# Patient Record
Sex: Female | Born: 1980 | Race: Black or African American | Hispanic: No
Health system: Midwestern US, Community
[De-identification: ages and names within clinical notes are randomized; demographics above are authoritative.]

## PROBLEM LIST (undated history)

## (undated) DIAGNOSIS — N809 Endometriosis, unspecified: Secondary | ICD-10-CM

## (undated) DIAGNOSIS — E119 Type 2 diabetes mellitus without complications: Secondary | ICD-10-CM

## (undated) DIAGNOSIS — F329 Major depressive disorder, single episode, unspecified: Secondary | ICD-10-CM

## (undated) DIAGNOSIS — F419 Anxiety disorder, unspecified: Secondary | ICD-10-CM

## (undated) DIAGNOSIS — F32A Depression, unspecified: Secondary | ICD-10-CM

## (undated) DIAGNOSIS — D649 Anemia, unspecified: Secondary | ICD-10-CM

## (undated) DIAGNOSIS — Z8489 Family history of other specified conditions: Secondary | ICD-10-CM

## (undated) DIAGNOSIS — N898 Other specified noninflammatory disorders of vagina: Secondary | ICD-10-CM

## (undated) DIAGNOSIS — K5730 Diverticulosis of large intestine without perforation or abscess without bleeding: Secondary | ICD-10-CM

## (undated) DIAGNOSIS — G5601 Carpal tunnel syndrome, right upper limb: Secondary | ICD-10-CM

## (undated) DIAGNOSIS — I10 Essential (primary) hypertension: Secondary | ICD-10-CM

## (undated) DIAGNOSIS — Z794 Long term (current) use of insulin: Principal | ICD-10-CM

## (undated) DIAGNOSIS — E11649 Type 2 diabetes mellitus with hypoglycemia without coma: Principal | ICD-10-CM

## (undated) DIAGNOSIS — Z1231 Encounter for screening mammogram for malignant neoplasm of breast: Principal | ICD-10-CM

## (undated) DIAGNOSIS — R000 Tachycardia, unspecified: Principal | ICD-10-CM

## (undated) DIAGNOSIS — R102 Pelvic and perineal pain unspecified side: Secondary | ICD-10-CM

## (undated) DIAGNOSIS — E1140 Type 2 diabetes mellitus with diabetic neuropathy, unspecified: Secondary | ICD-10-CM

## (undated) DIAGNOSIS — J0110 Acute frontal sinusitis, unspecified: Secondary | ICD-10-CM

## (undated) DIAGNOSIS — G8918 Other acute postprocedural pain: Secondary | ICD-10-CM

## (undated) DIAGNOSIS — R109 Unspecified abdominal pain: Secondary | ICD-10-CM

## (undated) DIAGNOSIS — N83209 Unspecified ovarian cyst, unspecified side: Secondary | ICD-10-CM

## (undated) DIAGNOSIS — B351 Tinea unguium: Secondary | ICD-10-CM

## (undated) DIAGNOSIS — D1722 Benign lipomatous neoplasm of skin and subcutaneous tissue of left arm: Secondary | ICD-10-CM

## (undated) DIAGNOSIS — E1165 Type 2 diabetes mellitus with hyperglycemia: Principal | ICD-10-CM

## (undated) DIAGNOSIS — K219 Gastro-esophageal reflux disease without esophagitis: Secondary | ICD-10-CM

## (undated) DIAGNOSIS — R002 Palpitations: Principal | ICD-10-CM

## (undated) DIAGNOSIS — R771 Abnormality of globulin: Secondary | ICD-10-CM

## (undated) DIAGNOSIS — G8929 Other chronic pain: Secondary | ICD-10-CM

## (undated) DIAGNOSIS — M25522 Pain in left elbow: Secondary | ICD-10-CM

## (undated) DIAGNOSIS — Z90710 Acquired absence of both cervix and uterus: Secondary | ICD-10-CM

## (undated) DIAGNOSIS — N920 Excessive and frequent menstruation with regular cycle: Secondary | ICD-10-CM

## (undated) DIAGNOSIS — M545 Low back pain, unspecified: Secondary | ICD-10-CM

## (undated) DIAGNOSIS — E041 Nontoxic single thyroid nodule: Secondary | ICD-10-CM

## (undated) DIAGNOSIS — D259 Leiomyoma of uterus, unspecified: Secondary | ICD-10-CM

## (undated) DIAGNOSIS — G4733 Obstructive sleep apnea (adult) (pediatric): Secondary | ICD-10-CM

## (undated) DIAGNOSIS — K432 Incisional hernia without obstruction or gangrene: Secondary | ICD-10-CM

## (undated) DIAGNOSIS — E118 Type 2 diabetes mellitus with unspecified complications: Secondary | ICD-10-CM

## (undated) DIAGNOSIS — D251 Intramural leiomyoma of uterus: Secondary | ICD-10-CM

## (undated) DIAGNOSIS — R1033 Periumbilical pain: Secondary | ICD-10-CM

## (undated) DIAGNOSIS — J4521 Mild intermittent asthma with (acute) exacerbation: Secondary | ICD-10-CM

## (undated) DIAGNOSIS — Z111 Encounter for screening for respiratory tuberculosis: Secondary | ICD-10-CM

## (undated) HISTORY — DX: Endometriosis, unspecified: N80.9

## (undated) HISTORY — PX: APPENDECTOMY: SHX54

## (undated) MED FILL — PRODIGY TWIST TOP LANCETS 28  MISC: 90 days supply | Qty: 100 | Fill #0 | Status: AC

## (undated) MED FILL — CICLOPIROX 0.77% GEL: 0.77 % | 3 days supply | Qty: 45 | Fill #0 | Status: AC

## (undated) MED FILL — VALACYCLOVIR HCL 500MG TABS: 500 MG | 90 days supply | Qty: 180 | Fill #0 | Status: AC

## (undated) MED FILL — FREESTYLE LIBRE 2 SENSOR  MISC: 84 days supply | Qty: 6 | Fill #0 | Status: AC

## (undated) MED FILL — ROSUVASTATIN CALCIUM 10MG TABS: 10 MG | 90 days supply | Qty: 90 | Fill #0 | Status: AC

## (undated) MED FILL — OZEMPIC 1MG/DOSE SOPN (3ML=1BOX): 2 MG/1.5ML | 84 days supply | Qty: 9 | Fill #0 | Status: AC

## (undated) MED FILL — LEADER PEN NEEDLE 31G X 8MM (5/16"): 31G X 8 MM | 90 days supply | Qty: 300 | Fill #0 | Status: AC

## (undated) MED FILL — BUTALBITAL-APAP-CAFF 50-300-40 CAPS: 50-300-40 MG | 3 days supply | Qty: 20 | Fill #0 | Status: AC

## (undated) MED FILL — PRODIGY AUTOCODE BLOOD W/DEVICE KIT: w/Device | 30 days supply | Qty: 1 | Fill #0 | Status: AC

## (undated) MED FILL — TOUJEO MAX SOLOSTAR 300UN/ML (6ML=1 BOX): 300 UNIT/ML | 90 days supply | Qty: 24 | Fill #2 | Status: AC

## (undated) MED FILL — B-D UF PEN NEED 5/16" 31G (8MM): 31G X 8 MM | 90 days supply | Qty: 300 | Fill #0 | Status: AC

## (undated) MED FILL — PRODIGY NO CODING BLOOD GLUC  STRP: 90 days supply | Qty: 100 | Fill #0 | Status: AC

## (undated) MED FILL — PRODIGY NO CODING BLOOD GLUC  STRP: 90 days supply | Qty: 400 | Fill #0 | Status: AC

## (undated) MED FILL — LISINOPRIL 10MG TABS: 10 MG | 90 days supply | Qty: 90 | Fill #0 | Status: AC

## (undated) MED FILL — TRULICITY 4.5MG/0.5ML PEN: 4.5 MG/0.5ML | 84 days supply | Qty: 6 | Fill #1 | Status: AC

## (undated) MED FILL — TRULICITY 0.75MG/0.5ML PEN: 0.75 MG/0.5ML | 28 days supply | Qty: 2 | Fill #0 | Status: AC

## (undated) MED FILL — METFORMIN HCL ER 500MG TB24: 500 MG | 90 days supply | Qty: 360 | Fill #1 | Status: AC

## (undated) MED FILL — TRULICITY 4.5MG/0.5ML PEN: 4.5 MG/0.5ML | 84 days supply | Qty: 6 | Fill #0 | Status: AC

## (undated) MED FILL — LEADER PEN NEEDLE 31G X 8MM (5/16"): 31G X 8 MM | 90 days supply | Qty: 100 | Fill #0 | Status: AC

## (undated) MED FILL — OZEMPIC 0.25 OR 0.5MG/DOSE (1.5ML=1BOX): 2 MG/1.5ML | 84 days supply | Qty: 6 | Fill #0 | Status: AC

## (undated) MED FILL — METHOCARBAMOL 500MG TABS: 500 MG | 5 days supply | Qty: 20 | Fill #0 | Status: AC

## (undated) MED FILL — TOUJEO MAX SOLOSTAR 300UN/ML (6ML=1 BOX): 300 UNIT/ML | 90 days supply | Qty: 24 | Fill #0 | Status: AC

## (undated) MED FILL — FLUTICASONE PROPIONATE 50 SUSP: 50 MCG/ACT | 90 days supply | Qty: 48 | Fill #0 | Status: AC

## (undated) MED FILL — TRULICITY 0.75MG/0.5ML PEN: 0.75 MG/0.5ML | 84 days supply | Qty: 6 | Fill #0 | Status: AC

## (undated) MED FILL — TRULICITY 0.75MG/0.5ML PEN: 0.75 MG/0.5ML | 84 days supply | Qty: 6 | Fill #1 | Status: AC

## (undated) MED FILL — LANTUS SOLOSTAR 100UNIT/ML (15ML=1BOX): 100 UNIT/ML | 90 days supply | Qty: 75 | Fill #1 | Status: AC

## (undated) MED FILL — LEADER PEN NEEDLE 31G X 8MM (5/16"): 31G X 8 MM | 90 days supply | Qty: 300 | Fill #1 | Status: AC

## (undated) MED FILL — DICYCLOMINE HCL 20MG TABS: 20 MG | 7 days supply | Qty: 30 | Fill #0 | Status: AC

## (undated) MED FILL — ONDANSETRON 4MG TBDP: 4 MG | 6 days supply | Qty: 20 | Fill #0 | Status: AC

## (undated) MED FILL — ALCOHOL SWABS  PADS: 90 days supply | Qty: 100 | Fill #0 | Status: AC

## (undated) MED FILL — ACETAMINOPHEN EXTRA STRENG 500 TABS: 500 MG | 6 days supply | Qty: 24 | Fill #0 | Status: AC

## (undated) MED FILL — IBUPROFEN 800MG TABS: 800 MG | 8 days supply | Qty: 30 | Fill #0 | Status: AC

## (undated) MED FILL — METFORMIN HCL ER 500MG TB24: 500 MG | 90 days supply | Qty: 360 | Fill #0 | Status: AC

## (undated) MED FILL — LANTUS SOLOSTAR 100UNIT/ML (15ML=1BOX): 100 UNIT/ML | 80 days supply | Qty: 60 | Fill #0 | Status: AC

## (undated) MED FILL — ROSUVASTATIN CALCIUM 10MG TABS: 10 MG | 90 days supply | Qty: 90 | Fill #1 | Status: AC

## (undated) MED FILL — LISINOPRIL-HYDROCHLORO 10-12.5 TABS: 10-12.5 MG | 90 days supply | Qty: 90 | Fill #0 | Status: AC

## (undated) MED FILL — TRULICITY 4.5MG/0.5ML PEN: 4.5 MG/0.5ML | 84 days supply | Qty: 6 | Fill #2 | Status: AC

## (undated) MED FILL — FLUCONAZOLE 200MG TABS: 200 MG | 84 days supply | Qty: 18 | Fill #0 | Status: AC

## (undated) MED FILL — TOUJEO MAX SOLOSTAR 300UN/ML (6ML=1 BOX): 300 UNIT/ML | 90 days supply | Qty: 24 | Fill #1 | Status: AC

## (undated) MED FILL — LANTUS SOLOSTAR 100UNIT/ML (15ML=1BOX): 100 UNIT/ML | 56 days supply | Qty: 45 | Fill #0 | Status: AC

## (undated) MED FILL — PRODIGY AUTOCODE BLOOD GLUCO  DEVI: 1 days supply | Qty: 1 | Fill #0 | Status: AC

## (undated) MED FILL — TRULICITY 4.5MG/0.5ML PEN: 4.5 MG/0.5ML | 84 days supply | Qty: 6 | Fill #3 | Status: AC

## (undated) MED FILL — METFORMIN HCL ER 500MG TB24: 500 MG | 90 days supply | Qty: 180 | Fill #0 | Status: AC

## (undated) MED FILL — B-D UF PEN NEED 5/16" 31G (8MM): 31G X 8 MM | 90 days supply | Qty: 100 | Fill #1 | Status: AC

## (undated) MED FILL — FREESTYLE LIBRE 2 SENSOR  MISC: 84 days supply | Qty: 6 | Fill #1 | Status: AC

## (undated) MED FILL — TRULICITY 1.5MG/0.5ML PEN: 1.5 MG/0.5ML | 28 days supply | Qty: 2 | Fill #0 | Status: AC

## (undated) MED FILL — TRULICITY 3MG/0.5ML PEN: 3 MG/0.5ML | 28 days supply | Qty: 2 | Fill #0 | Status: AC

## (undated) MED FILL — FREESTYLE LIBRE 14 DAY SENSO  MISC: 28 days supply | Qty: 2 | Fill #0 | Status: AC

## (undated) MED FILL — LIDOCAINE 5% PTCH (30EACH=1BOX): 5 % | 5 days supply | Qty: 5 | Fill #0 | Status: AC

## (undated) MED FILL — LANTUS SOLOSTAR 100UNIT/ML (15ML=1BOX): 100 UNIT/ML | 90 days supply | Qty: 75 | Fill #0 | Status: AC

## (undated) MED FILL — PRODIGY LANCING DEVICE  MISC: 1 days supply | Qty: 1 | Fill #0 | Status: AC

---

## 2005-08-28 ENCOUNTER — Inpatient Hospital Stay (HOSPITAL_COMMUNITY): Admission: AD | Admit: 2005-08-28 | Discharge: 2005-08-29 | Payer: Self-pay | Admitting: Obstetrics and Gynecology

## 2005-12-17 ENCOUNTER — Ambulatory Visit: Payer: Self-pay | Admitting: Certified Nurse Midwife

## 2005-12-17 ENCOUNTER — Inpatient Hospital Stay (HOSPITAL_COMMUNITY): Admission: AD | Admit: 2005-12-17 | Discharge: 2005-12-17 | Payer: Self-pay | Admitting: Family Medicine

## 2006-01-06 ENCOUNTER — Inpatient Hospital Stay (HOSPITAL_COMMUNITY): Admission: AD | Admit: 2006-01-06 | Discharge: 2006-01-06 | Payer: Self-pay | Admitting: Gynecology

## 2006-01-06 ENCOUNTER — Ambulatory Visit: Payer: Self-pay | Admitting: Gynecology

## 2006-01-30 ENCOUNTER — Ambulatory Visit: Payer: Self-pay | Admitting: Obstetrics & Gynecology

## 2006-01-30 ENCOUNTER — Inpatient Hospital Stay (HOSPITAL_COMMUNITY): Admission: RE | Admit: 2006-01-30 | Discharge: 2006-02-02 | Payer: Self-pay | Admitting: Obstetrics & Gynecology

## 2006-02-05 ENCOUNTER — Ambulatory Visit: Payer: Self-pay | Admitting: Obstetrics and Gynecology

## 2006-02-05 ENCOUNTER — Inpatient Hospital Stay (HOSPITAL_COMMUNITY): Admission: AD | Admit: 2006-02-05 | Discharge: 2006-02-05 | Payer: Self-pay | Admitting: Obstetrics & Gynecology

## 2006-02-06 ENCOUNTER — Inpatient Hospital Stay (HOSPITAL_COMMUNITY): Admission: AD | Admit: 2006-02-06 | Discharge: 2006-02-06 | Payer: Self-pay | Admitting: Obstetrics & Gynecology

## 2006-12-15 ENCOUNTER — Emergency Department (HOSPITAL_COMMUNITY): Admission: EM | Admit: 2006-12-15 | Discharge: 2006-12-15 | Payer: Self-pay | Admitting: Emergency Medicine

## 2007-08-04 ENCOUNTER — Emergency Department (HOSPITAL_COMMUNITY): Admission: EM | Admit: 2007-08-04 | Discharge: 2007-08-04 | Payer: Self-pay | Admitting: Emergency Medicine

## 2007-08-06 ENCOUNTER — Emergency Department (HOSPITAL_COMMUNITY): Admission: EM | Admit: 2007-08-06 | Discharge: 2007-08-06 | Payer: Self-pay | Admitting: Emergency Medicine

## 2008-01-17 ENCOUNTER — Inpatient Hospital Stay (HOSPITAL_COMMUNITY): Admission: AD | Admit: 2008-01-17 | Discharge: 2008-01-17 | Payer: Self-pay | Admitting: Obstetrics & Gynecology

## 2008-05-16 ENCOUNTER — Inpatient Hospital Stay (HOSPITAL_COMMUNITY): Admission: EM | Admit: 2008-05-16 | Discharge: 2008-05-20 | Payer: Self-pay | Admitting: Emergency Medicine

## 2008-05-16 ENCOUNTER — Ambulatory Visit: Payer: Self-pay | Admitting: Cardiology

## 2008-05-20 ENCOUNTER — Encounter (INDEPENDENT_AMBULATORY_CARE_PROVIDER_SITE_OTHER): Payer: Self-pay | Admitting: Internal Medicine

## 2008-06-12 ENCOUNTER — Ambulatory Visit: Payer: Self-pay | Admitting: Obstetrics and Gynecology

## 2008-07-28 ENCOUNTER — Ambulatory Visit: Payer: Self-pay | Admitting: Obstetrics & Gynecology

## 2008-07-28 ENCOUNTER — Ambulatory Visit (HOSPITAL_COMMUNITY): Admission: RE | Admit: 2008-07-28 | Discharge: 2008-07-28 | Payer: Self-pay | Admitting: Obstetrics & Gynecology

## 2008-07-28 HISTORY — PX: TUBAL LIGATION: SHX77

## 2008-08-22 ENCOUNTER — Ambulatory Visit: Payer: Self-pay | Admitting: Obstetrics & Gynecology

## 2008-10-06 ENCOUNTER — Emergency Department (HOSPITAL_COMMUNITY): Admission: EM | Admit: 2008-10-06 | Discharge: 2008-10-06 | Payer: Self-pay | Admitting: Internal Medicine

## 2009-06-12 ENCOUNTER — Emergency Department (HOSPITAL_COMMUNITY): Admission: EM | Admit: 2009-06-12 | Discharge: 2009-06-12 | Payer: Self-pay | Admitting: Emergency Medicine

## 2009-07-12 ENCOUNTER — Emergency Department (HOSPITAL_COMMUNITY): Admission: EM | Admit: 2009-07-12 | Discharge: 2009-07-12 | Payer: Self-pay | Admitting: Emergency Medicine

## 2009-09-26 ENCOUNTER — Emergency Department (HOSPITAL_COMMUNITY): Admission: EM | Admit: 2009-09-26 | Discharge: 2009-09-26 | Payer: Self-pay | Admitting: Emergency Medicine

## 2010-07-12 LAB — HIV ANTIBODY (ROUTINE TESTING W REFLEX): HIV: NONREACTIVE

## 2010-07-12 LAB — POCT PREGNANCY, URINE: Preg Test, Ur: NEGATIVE

## 2010-07-28 LAB — CBC
HCT: 36.7 % (ref 36.0–46.0)
Hemoglobin: 12 g/dL (ref 12.0–15.0)
MCHC: 32.7 g/dL (ref 30.0–36.0)
Platelets: 395 10*3/uL (ref 150–400)
RBC: 4.52 MIL/uL (ref 3.87–5.11)
WBC: 9.3 10*3/uL (ref 4.0–10.5)

## 2010-08-02 LAB — DIFFERENTIAL
Basophils Relative: 1 % (ref 0–1)
Eosinophils Absolute: 0.2 10*3/uL (ref 0.0–0.7)
Eosinophils Relative: 2 % (ref 0–5)
Lymphocytes Relative: 20 % (ref 12–46)
Monocytes Relative: 4 % (ref 3–12)
Monocytes Relative: 6 % (ref 3–12)
Neutro Abs: 10.6 10*3/uL — ABNORMAL HIGH (ref 1.7–7.7)
Neutro Abs: 7.6 10*3/uL (ref 1.7–7.7)

## 2010-08-02 LAB — BASIC METABOLIC PANEL
Creatinine, Ser: 0.65 mg/dL (ref 0.4–1.2)
GFR calc Af Amer: >60 mL/min (ref >60)
GFR calc non Af Amer: >60 mL/min (ref >60)
Glucose, Bld: 131 mg/dL — ABNORMAL HIGH (ref 70–99)

## 2010-08-02 LAB — CBC
HCT: 32.5 % — ABNORMAL LOW (ref 36.0–46.0)
Hemoglobin: 11.8 g/dL — ABNORMAL LOW (ref 12.0–15.0)
MCHC: 32.6 g/dL (ref 30.0–36.0)
MCV: 79.6 fL (ref 78.0–100.0)
MCV: 80.2 fL (ref 78.0–100.0)
MCV: 80.8 fL (ref 78.0–100.0)
Platelets: 310 10*3/uL (ref 150–400)
RBC: 4.05 MIL/uL (ref 3.87–5.11)
RBC: 4.53 MIL/uL (ref 3.87–5.11)
RDW: 15.7 % — ABNORMAL HIGH (ref 11.5–15.5)

## 2010-08-02 LAB — LIPID PANEL
Cholesterol: 132 mg/dL (ref 0–200)
HDL: 28 mg/dL — ABNORMAL LOW (ref >39)
Total CHOL/HDL Ratio: 4.7 RATIO
VLDL: 17 mg/dL (ref 0–40)

## 2010-08-02 LAB — POCT I-STAT, CHEM 8
BUN: <3 mg/dL — ABNORMAL LOW (ref 6–23)
Calcium, Ion: 1.09 mmol/L — ABNORMAL LOW (ref 1.12–1.32)
HCT: 39 % (ref 36.0–46.0)
Hemoglobin: 13.3 g/dL (ref 12.0–15.0)
TCO2: 24 mmol/L (ref 0–100)

## 2010-08-02 LAB — CULTURE, BLOOD (ROUTINE X 2): Culture: NO GROWTH

## 2010-08-02 LAB — D-DIMER, QUANTITATIVE: D-Dimer, Quant: 1.1 ug/mL-FEU — ABNORMAL HIGH (ref 0.00–0.48)

## 2010-08-03 LAB — PREGNANCY, URINE: Preg Test, Ur: NEGATIVE

## 2010-08-31 NOTE — Group Therapy Note (Signed)
Kara [name], Kara [name]               ACCOUNT NO.:  [id number]   MEDICAL RECORD NO.:  [id number]          PATIENT TYPE:  WOC   LOCATION:  [facility]                   FACILITY:  [facility]   PHYSICIAN:  Johnella Moloney, MD        DATE OF BIRTH:  11-04-1980   DATE OF SERVICE:  08/22/2008                                  CLINIC NOTE   The patient is a 30 year old gravida 3, para 2-0-1-2 status post  bilateral tubal sterilization using Filshie clips on July 28, 2008.  The patient is here for her followup postoperative examination.  She  reports having some pain occasionally on the right side, but denies any  other complaints.  She is requesting some pain medications for this  occasional pain.   PHYSICAL EXAMINATION:  VITAL SIGNS:  The patient has a temperature of  97.6, pulse of 94, respirations 12, blood pressure 135/90, weight 253.5  pounds, height 5 feet 3-3/4 inches.  GENERAL:  No apparent distress.  ABDOMEN:  Soft, nontender, nondistended.  A well healed laparoscopic  umbilical site.  PELVIC:  Deferred at this visit.   ASSESSMENT AND PLAN:  The patient is a 30 year old gravida 3, para 2-0-1-  2 status post laparoscopic bilateral tubal sterilization.  The patient  does reports occasional postoperative pain, was given a prescription for  some Percocet for severe pain and also ibuprofen.  The patient was told  to follow up if she has any further concerns.  Her annual gynecologic  examination is due in December 2010 and this will be booked at the end  of this visit.            ______________________________  Johnella Moloney, MD     UD/MEDQ  D:  08/22/2008  T:  08/23/2008  Job:  [id number]

## 2010-08-31 NOTE — Op Note (Signed)
NAMEZORANA, BROCKWELL               ACCOUNT NO.:  [id number]   MEDICAL RECORD NO.:  [id number]          PATIENT TYPE:  AMB   LOCATION:  [facility]                           [facility]   PHYSICIAN:  Norton Blizzard, MD    DATE OF BIRTH:  1980-09-03   DATE OF PROCEDURE:  07/28/2008  DATE OF DISCHARGE:                               OPERATIVE REPORT   PREOPERATIVE DIAGNOSIS:  Multiparity, desires sterilization.   POSTOPERATIVE DIAGNOSIS:  Multiparity, desires sterilization.   PROCEDURE:  Laparoscopic bilateral tubal ligation using Filshie clips   SURGEON:  Norton Blizzard, MD   ANESTHESIA:  General.   IV FLUIDS:  A liter of lactated Ringer's.   ESTIMATED BLOOD LOSS:  Minimal.   INDICATIONS:  The patient is a 30 year old gravida 4, para 2-0-2-2 who  desired sterilization.  Reversible modes of contraception were discussed  with the patient including all hormonal methods, barrier methods, IUDs,  and even offered vasectomy for female partner, but the patient wanted to  proceed with sterilization.  The risks of the sterilization procedure  including but not limited to bleeding which may require transfusion,  infection which might require antibiotics, injury to surrounding organs,  need for additional procedures, failure risk of 5-7 in a 1000 with  increased risk of ectopic gestation were discussed with the patient and  written informed consent was obtained.   FINDINGS:  The patient has a globular 12 weeks' size uterus, normal  adnexae bilaterally.  Normal upper abdomen on examination.   SPECIMENS:  None.   COMPLICATIONS:  None immediately.   PROCEDURE DETAILS:  The patient had sequential compressive devices  placed on her lower extremities in the preoperative area.  She was then  taken to the operating room where general anesthesia was administered  and found to be adequate.  The patient was then placed in a dorsal  lithotomy position, and prepped and draped in a sterile manner.  Attention was turned to the patient's abdomen where an umbilical  incision was made with a scalpel.  The Veress needle was placed at this  point, and correct intraperitoneal placement was confirmed with an  opening pressure of 5 mmHg upon insufflation with carbon dioxide gas.  The abdomen was insufflated to 15 mmHg, the Veress needle was removed,  and a 10-mm trocar was placed in the umbilical incision site.  At this  point, the operative laparoscope was placed and the upper abdomen was  surveyed and found to be normal.  The pelvis was also surveyed, and the  patient was noted to have a large 12 weeks' size globular uterus, which  could be adenomyotic in appearance, and normal adnexae bilaterally.  The  Filshie clip applicator was then used to place a Filshie clip in the mid-  isthmic region of both tubes with care given to incorporate the  underlying mesosalpinx.  Total occlusion of both tubes were noted.  There was minimal blood loss during the case.  The abdomen was  desufflated, and all instruments were removed from the patient's  abdomen.  The fascial part of the incision was reapproximated  using 0-  Vicryl in a figure-of-eight stitch, and the skin was closed with 4-0  Vicryl subcuticular stitch.  Local incisional analgesia was administered  in the form 10 mL of 0.25% Marcaine at the end of the case.  The patient  tolerated the procedure well.  Sponge, instrument, and needle counts  were correct x2.  She was taken to the recovery room in stable  condition.      Norton Blizzard, MD  Electronically Signed     UAD/MEDQ  D:  07/28/2008  T:  07/29/2008  Job:  [id number]

## 2010-08-31 NOTE — Discharge Summary (Signed)
NAMERUDI, KNIPPENBERG               ACCOUNT NO.:  [id number]   MEDICAL RECORD NO.:  [id number]          PATIENT TYPE:  INP   LOCATION:  1517                         [facility]ACILITY:  [facility]   PHYSICIAN:  Altha Harm, MDDATE OF BIRTH:  1981/01/23   DATE OF ADMISSION:  05/16/2008  DATE OF DISCHARGE:                               DISCHARGE SUMMARY   ADDENDUM   DATE OF INTERIM DISCHARGE SUMMARY:  May 17, 2008.   DATE OF FINAL DISCHARGE SUMMARY:  May 20, 2008.   DISCHARGE DIAGNOSES:  1. Please refer to the interim discharge summary for discharge      diagnoses.  2. Sinus tachycardia, asymptomatic.   DISCHARGE MEDICATIONS:  Modification to discharge medications:  Avelox 400 mg p.o. daily x4 days instead of 6 days.   HOSPITAL COURSE:  The patient was all set for discharge on the 31st and  was noted to have sinus tachycardia with any ambulation. This persisted  during her hospital stay. The patient had a 2D echocardiogram which  showed normal LV function and architecture.  The patient remains  asymptomatic from her sinus tachycardia and no intervention will be had  for this.  The patient should follow up with her primary care physician  at the end of course of antibiotic for further evaluation.      Altha Harm, MD  Electronically Signed     MAM/MEDQ  D:  05/20/2008  T:  05/20/2008  Job:  [id number]

## 2010-08-31 NOTE — Discharge Summary (Signed)
NAMEELLAH, OTTE               ACCOUNT NO.:  [id number]   MEDICAL RECORD NO.:  [id number]          PATIENT TYPE:  INP   LOCATION:  1517                         [facility]ILITY[facility]   PHYSICIAN:  Altha Harm, MDDATE OF BIRTH:  01-Feb-1981   DATE OF ADMISSION:  05/16/2008  DATE OF DISCHARGE:  05/18/2008                               DISCHARGE SUMMARY   DISCHARGE DISPOSITION:  Home.   FINAL DISCHARGE DIAGNOSES:  1. Community-acquired pneumonia in the left lingular and lower lobe.  2. Obesity.  3. Hypertension, diet modified, patient not on any medications.  4. Chronic back pain.  5. Metrorrhagia.  6. History of anemia.   DISCHARGE MEDICATIONS:  Include the following:  1. Avelox 400 mg p.o. daily times 6 days.  2. Mucinex LA 600 mg p.o. b.i.d.  3. Albuterol MDI 2 puffs p.o. inhaled q.4 h p.r.n.  4. Vicodin 1 tablet p.o. q.4 h p.r.n.   CONSULTANTS:  None.   PROCEDURES:  None.   DIAGNOSTIC STUDIES:  Two-view chest x-ray done on admission which shows  infiltrates involving the lingula and left lower lobe, likely  infectious.   CODE STATUS:  Full Code.   ALLERGIES:  1. SULFA.  2. IODINE.  3. CODEINE.   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   CHIEF COMPLAINT:  Cough and myalgias times 1 week.   HISTORY OF PRESENT ILLNESS:  This is a usually healthy female who came  to the hospital with complaints of fever, chills and cough productive of  a rusty sputum times 1 week.  The patient was found to have a pneumonia  on examination and chest x-ray.   HOSPITAL COURSE:  1. The patient was admitted and started on IV Avelox, IV fluids and      albuterol nebulization on a p.r.n. basis.  The patient defervesced      clinically.  She never had an oxygen requirement during      hospitalization.  The patient has been afebrile for the last 24      hours.  She has had no oxygen requirement while hospitalized, her      coughing has improved, although she  still productive of some rusty      sputum.  The patient is tolerating diet and ambulation without any      difficulty and she is able to use the incentive spirometer      effectively.  2. Hypertension.  The patient has an alleged diagnosis of      hypertension.  However, she has been on no medications for the last      3 months and during her hospitalization her blood pressures have      been running in the 120s/80s, which does not support the diagnosis      of hypertension.  Otherwise, the patient has remained stable.   At the time of discharge her white blood cell count is 10.5, hemoglobin  is 10.6, hematocrit is 32.5 and her platelet count is 310.   Today, the patient is afebrile, temperature is 98.2, heart rate 78,  blood pressure 125/85,  respiratory rate 18, O2 sats are 97% on room air.  LUNGS:  Clear to auscultation without any rhonchi and no dullness to  percussion.  She has got a normal S1 and S2, no murmurs, rubs or gallops  are noted.  ABDOMEN:  Obese, soft, nontender, nondistended.  No masses, no  hepatosplenomegaly noted.  EXTREMITY:  Shows no cyanosis, clubbing or edema.  The patient is nontoxic appearing.   FOLLOW UP:  The patient should follow-up with [facility] in [facility]  week and have a repeat chest x-ray to ensure improvement in terms of her  pneumonia.  In terms of dietary restrictions, there are none.  In terms  of physical restrictions activity as tolerated.      Altha Harm, MD  Electronically Signed     MAM/MEDQ  D:  05/18/2008  T:  05/18/2008  Job:  [id number]
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
## 2010-08-31 NOTE — H&P (Signed)
NAMESHANEIKA, ROSSA               ACCOUNT NO.:  [id number]   MEDICAL RECORD NO.:  [id number]          PATIENT TYPE:  INP   LOCATION:  1517                         FACILITY:  [facility]   PHYSICIAN:  Vania Rea, M.D. DATE OF BIRTH:  1980/04/20   DATE OF ADMISSION:  05/16/2008  DATE OF DISCHARGE:                              HISTORY & PHYSICAL   PRIMARY CARE PHYSICIAN:  Unassigned.   CHIEF COMPLAINT:  Cough for 1 week.   HISTORY OF PRESENT ILLNESS:  This is a 30 year old obese African  American lady with a history of hypertension,  noncompliant with  medications for the past 3 months, who has been having fever, chills,  cough productive of a rusty sputum for the past 1 week.  The patient  says she has taken no over-the-counter medications, but eventually came  to the emergency room to be evaluated.  She denies runny nose or  sneezing.  She denies any sick contacts.   PAST MEDICAL HISTORY:  1. Hypertension, noncompliant with Benicar HCT since November.  2. Uterine fibroids.  3. Menorrhagia.  4. Anemia related to menorrhagia.  5. Chronic back pain.  6. Obesity.   MEDICATIONS:  None.   ALLERGIES:  1. BETADINE causes a rash.  2. SULFA causes hives.  3. CODEINE causes nausea and vomiting.   SOCIAL HISTORY:  She used to smoke half a pack per day, but discontinued  as part of her New Year's resolution.  She denies alcohol or illicit  drug use.   FAMILY HISTORY:  Significant for diabetes, hypertension, coronary artery  disease and cancers.   REVIEW OF SYSTEMS:  Other than noted above, a 10-point review of systems  is unremarkable.   PHYSICAL EXAMINATION:  GENERAL:  Ill-looking, morbidly obese African  American lady lying in the stretcher. VITAL SIGNS:  Temperature is  102.1, pulse is 105, respiration 24, blood pressure 130/88.  She is  saturating at 95% on room air.  HEENT:  Her pupils are round and equal.  Mucous membranes are pink,  anicteric.  NECK:  No thyromegaly.   No cervical lymphadenopathy.  She has a very  thick neck.  CHEST:  She has coarse rhonchi at bilateral bases.  CARDIOVASCULAR:  She is tachycardiac.  No murmur heard.  ABDOMEN:  Obese, soft, nontender.  EXTREMITIES:  Without edema.  She has 2+ dorsalis pedis pulses  bilaterally.  CENTRAL NERVOUS SYSTEM:  Cranial nerves II-XII are grossly intact and  she has no focal neurologic deficit.   LABORATORY DATA:  White count is 13,000, hemoglobin 11.8, MCV 79.6,  platelets 257.  Serum chemistry is significant for BUN less than 3,  creatinine 0.9, otherwise unremarkable.   DIAGNOSTICS:  Two-view chest x-ray shows infiltrates in the lingula and  left lower lobe likely infectious.   ASSESSMENT:  1. Left lower lobe pneumonia.  2. Morbid obesity as evidenced by a body mass index of 42.2 related to      a height of 5 feet 4 inches and a weight of 111.6 pounds.   PLAN:  We will admit this lady for IV antibiotics.  We will  use Avelox,  O2 supplementation as necessary, IV fluids as necessary, pain control as  necessary.  We will give DVT prophylaxis, other plans as per orders.  We  will monitor this lady's blood pressure while in hospital, currently she  is normotensive.      Vania Rea, M.D.  Electronically Signed     LC/MEDQ  D:  05/17/2008  T:  05/17/2008  Job:  [id number]

## 2010-09-03 NOTE — Discharge Summary (Signed)
Kara [name], Kara [name]               ACCOUNT NO.:  [id number]   MEDICAL RECORD NO.:  [id number]          [name] TYPE:  INP   LOCATION:  9145                          [facility]ILITY:  [facility]   PHYSICIAN:  Lesly Dukes, M.D. DATE OF BIRTH:  October 07, 1980   DATE OF ADMISSION:  01/30/2006  DATE OF DISCHARGE:  02/02/2006                                 DISCHARGE SUMMARY   DISCHARGE DIAGNOSES:  1. Kara [name] is now gravida 5, para 2-0-3-2.  2. Status post repeat low transverse cesarean section for delivery of a      female infant.   CONSULTATIONS:  Social work.   OPERATION/PROCEDURE:  Scheduled repeat low transverse cesarean section on  January 30, 2006.   PRENATAL LABS:  Blood type AB positive, antibody negative.  Rubella immune.  Hepatitis B surface antigen negative. HIV negative.  GC negative.  Chlamydia  negative.  GBS not performed.  One-hour GCT 100.  RPR nonreactive.   INPATIENT LABORATORY DATA:  Hemoglobin 9.6 at discharge with hematocrit  28.8, platelets 259 at discharge.  White count 11 at discharge.  RPR  nonreactive.   HISTORY:  Kara [name] is a 30 year old, gravida 5, now para 2-0-3-2,  presented at 39 weeks for scheduled repeat cesarean section with a history  of previous low transverse cesarean section for failure to progress.  Kara  [name] complains of mild headaches but no visual changes and her blood  pressures were within normal range at 121/84.  She had one previous cesarean  section and three SABs.  For her history, she also has a history of  hypertension although her pressures were within normal range.  She was  admitted for scheduled repeat cesarean section and did not desire to be BTL.  Planned to have a social work consult postpartum.   HOSPITAL COURSE:  Kara [name] had repeat low transverse cesarean section  without significant difficulties.  Please see full dictated op report by Dr.  Carolanne Grumbling for complete details.  Kara [name] had a relatively  unremarkable postoperative course and was stable with normal blood pressures  and was afebrile.  She did have persistent pain at Kara incision site even on  Kara day of discharge as she was aggressively ambulated early in Kara  postpartum course, but had responded well to Percocet and will be discharged  with prescription for this.  As Kara [name] does have small pannus and was  still having persistent pain at Kara incision site, staples were not removed  prior to discharge and she will be given Kara number to call and schedule a  return appointment in Kara next three to four days to have her staples  removed.  She will receive Depo-Provera prior to discharge for  contraception.   With regard to Kara [name]'s social situation, there were initially some  conflicting reports about Kara father of Kara baby given to Dr. Penne Lash and  other providers; therefore, social work was consulted.  According to their  assessment, Kara [name] has been living at [facility] [place] since August  after being evicted from her previous residence and  she had plans to move to  two-year transitional housing through [facility] although this is not  entirely set up at Kara time of dictation.  Kara [name] did report having  WIC, Medicaid and needed baby supplies already.  She reports that Kara father  of Kara baby/husband actually died in Jan 25, 2023 but she does have good care  __________  through Kara grandmother and plans to return to [facility]  [place] initially postpartum. Social work was scheduled to see Kara [name] at  Kara time of dictation and Kara [name] will be discharged only after her  housing situation has been more concretely determined.   Kara [name] does report having a history of postpartum depression with her  last pregnancy and has counseling for this.  She was given routine newborn  precautions and postpartum depression precautions and again voiced good  understanding.  She knows to  seek care if she  develops recurrence of her  postpartum depression symptoms prior to her six-week followup visit.   CONDITION ON DISCHARGE:  Stable.   DISPOSITION:  Kara [name] is discharged to [facility] with later  housing to be determined by social work later today.   DISCHARGE MEDICATIONS:  1. Ibuprofen 600 mg p.o. q.6h. p.r.n. pain.  2. Prenatal vitamins one tablet p.o. daily.  3. Colace 100 mg p.o. b.i.d. p.r.n. constipation.  4. Percocet 5/325 mg one to two tablets p.o. q.4-6h. p.r.n. breakthrough      pain (#20) with no refills.   DISCHARGE INSTRUCTIONS:  1. Kara [name] is to observe pelvic rest for six weeks but is otherwise to      resume activity as tolerated.  2. Continue with regular diet.  3. She understands assessment and care is needed if she develops      recurrence of her postpartum depression symptoms as stated above.   FOLLOW UP:  Kara [name] is to return to [facility] in [place] next three or  four days to have her surgical staples removed.  She is then to have routine  six-week postpartum followup at [facility] in six weeks.     ______________________________  Obstetrics Resident    ______________________________  Lesly Dukes, M.D.    OR/MEDQ  D:  02/02/2006  T:  02/03/2006  Job:  [id number]

## 2010-09-03 NOTE — Op Note (Signed)
Kara [name], SIEGRIST               ACCOUNT NO.:  [id number]   MEDICAL RECORD NO.:  [id number]          PATIENT TYPE:  INP   LOCATION:  9145                          FACILITY:  [facility]   PHYSICIAN:  Tracy L. Mayford Knife, M.D.DATE OF BIRTH:  [birth year]   DATE OF PROCEDURE:  01/30/2006  DATE OF DISCHARGE:                                 OPERATIVE REPORT   PREOPERATIVE DIAGNOSES:  1. A 39-week intrauterine pregnancy.  2. History of previous cesarean section for failure to progress, and      desire for repeat.   POSTOPERATIVE DIAGNOSES:  1. A 39-week intrauterine pregnancy.  2. History of previous cesarean section for failure to progress, and      desire for repeat.   SURGERY:  Repeat low transverse cesarean section via Pfannenstiel skin  incision.   SURGEON:  Lesly Dukes, M.D.   ASSISTANT:  Marc Morgans. Mayford Knife, M.D.   ESTIMATED BLOOD LOSS:  750.   URINE OUTPUT:  250.   IV FLUIDS:  3500 mL of lactated Ringer's.   SPECIMENS:  Placenta sent to labor and delivery, and cord gases sent to lab.   INDICATIONS:  The patient is a 30 year old G2, P1-0-0-1, at 39 weeks, with  history of previous cesarean section for failure to progress, who desires  repeat.  She was counseled on the risks and benefits and wanted to proceed.   FINDINGS:  Viable female infant in the cephalic presentation, Apgars 9 and  9.  Weight 7 pounds 5 ounces.  Normal uterus, tubes, and ovaries.  Clear  amniotic fluid.   PROCEDURE:  The patient was taken to the operating room, where spinal  anesthesia was placed and found to be adequate.  She was then prepped and  draped in a normal sterile fashion in the dorsal supine position with a  leftward tilt.  A Pfannenstiel  skin incision was made with a scalpel and  carried through to the underlying layer of fascia.  Incision was made and  extended laterally with the Mayo scissors.  The inferior aspect of the  fascial incision was then grasped with Kocher clamps, elevated,  and  underlying rectus muscles dissected off bluntly and using Mayos.  Attention  was then turned to the superior aspect of the incision, which in a similar  fashion was grasped, tented up with the Kocher clamps, and the rectus  muscles dissected off bluntly and using the Mayo scissors.  Rectus muscles  were separated in the midline, and the peritoneum entered.  Peritoneal  incision was extended superiorly and inferiorly, with good visualization of  the bladder.  Bladder blade was inserted, and the vesicouterine peritoneum  identified, grasped with pickups, and entered sharply with Metzenbaum  scissors.  Incision was extended laterally and the bladder flap created  digitally.   Bladder blade was reinserted and the lower uterine segment incised in  transverse fashion with a scalpel.  Uterine incision was extended laterally  with the bandage scissors.  Bladder blade was removed, and the infant's head  and body delivered atraumatically.  Nose and mouth were bulb suctioned, and  the infant  was handed to the awaiting neonatologist.   Placenta was removed manually.  The uterus was not exteriorized but was  cleared of all clots and debris.  Uterine incision was repaired with 0  Vicryl in a running locked fashion.  There was excellent hemostasis.  The  gutters were irrigated.  The fascia was then closed with 0 Vicryl in a  running fashion.  Skin was closed with staples.   The patient tolerated the procedure well.  Sponge, lap, and needle counts  were correct x2.  Antibiotics were given at cord clamp.  The patient was  taken to the recovery room in stable condition.  There were no  complications.           ______________________________  Marc Morgans Mayford Knife, M.D.     TLW/MEDQ  D:  01/31/2006  T:  01/31/2006  Job:  [id number]

## 2010-09-04 ENCOUNTER — Emergency Department (HOSPITAL_COMMUNITY): Payer: Medicaid Other

## 2010-09-04 ENCOUNTER — Inpatient Hospital Stay (HOSPITAL_COMMUNITY)
Admission: EM | Admit: 2010-09-04 | Discharge: 2010-09-09 | DRG: 392 | Disposition: A | Discharge status: Home / Self Care | Payer: Medicaid Other | Attending: Internal Medicine | Admitting: Internal Medicine

## 2010-09-04 DIAGNOSIS — B379 Candidiasis, unspecified: Secondary | ICD-10-CM | POA: Diagnosis not present

## 2010-09-04 DIAGNOSIS — F329 Major depressive disorder, single episode, unspecified: Secondary | ICD-10-CM | POA: Diagnosis present

## 2010-09-04 DIAGNOSIS — E876 Hypokalemia: Secondary | ICD-10-CM | POA: Diagnosis not present

## 2010-09-04 DIAGNOSIS — K5732 Diverticulitis of large intestine without perforation or abscess without bleeding: Principal | ICD-10-CM | POA: Diagnosis present

## 2010-09-04 DIAGNOSIS — I10 Essential (primary) hypertension: Secondary | ICD-10-CM | POA: Diagnosis present

## 2010-09-04 DIAGNOSIS — N920 Excessive and frequent menstruation with regular cycle: Secondary | ICD-10-CM | POA: Diagnosis present

## 2010-09-04 DIAGNOSIS — D500 Iron deficiency anemia secondary to blood loss (chronic): Secondary | ICD-10-CM | POA: Diagnosis present

## 2010-09-04 DIAGNOSIS — F3289 Other specified depressive episodes: Secondary | ICD-10-CM | POA: Diagnosis present

## 2010-09-04 DIAGNOSIS — D259 Leiomyoma of uterus, unspecified: Secondary | ICD-10-CM | POA: Diagnosis present

## 2010-09-04 DIAGNOSIS — D72829 Elevated white blood cell count, unspecified: Secondary | ICD-10-CM | POA: Diagnosis present

## 2010-09-04 DIAGNOSIS — F17200 Nicotine dependence, unspecified, uncomplicated: Secondary | ICD-10-CM | POA: Diagnosis present

## 2010-09-04 DIAGNOSIS — R300 Dysuria: Secondary | ICD-10-CM | POA: Diagnosis not present

## 2010-09-04 DIAGNOSIS — R109 Unspecified abdominal pain: Secondary | ICD-10-CM | POA: Diagnosis present

## 2010-09-04 LAB — COMPREHENSIVE METABOLIC PANEL
AST: 15 U/L (ref 0–37)
Albumin: 3.8 g/dL (ref 3.5–5.2)
Calcium: 9.1 mg/dL (ref 8.4–10.5)
Creatinine, Ser: 0.67 mg/dL (ref 0.4–1.2)
GFR calc Af Amer: >60 mL/min (ref >60)
Sodium: 133 mEq/L — ABNORMAL LOW (ref 135–145)
Total Protein: 7.4 g/dL (ref 6.0–8.3)

## 2010-09-04 LAB — CBC
Hemoglobin: 11.1 g/dL — ABNORMAL LOW (ref 12.0–15.0)
MCHC: 31.5 g/dL (ref 30.0–36.0)
RBC: 4.49 MIL/uL (ref 3.87–5.11)
RDW: 17.6 % — ABNORMAL HIGH (ref 11.5–15.5)

## 2010-09-04 LAB — URINALYSIS, ROUTINE W REFLEX MICROSCOPIC
Glucose, UA: NEGATIVE mg/dL
Hgb urine dipstick: NEGATIVE
Specific Gravity, Urine: 1.012 (ref 1.005–1.030)
Urobilinogen, UA: 0.2 mg/dL (ref 0.0–1.0)
pH: 7 (ref 5.0–8.0)

## 2010-09-04 LAB — DIFFERENTIAL
Basophils Absolute: 0 10*3/uL (ref 0.0–0.1)
Basophils Relative: 0 % (ref 0–1)
Lymphs Abs: 1.9 10*3/uL (ref 0.7–4.0)
Neutro Abs: 8.3 10*3/uL — ABNORMAL HIGH (ref 1.7–7.7)

## 2010-09-04 LAB — WET PREP, GENITAL
Trich, Wet Prep: NONE SEEN
WBC, Wet Prep HPF POC: NONE SEEN
Yeast Wet Prep HPF POC: NONE SEEN

## 2010-09-04 LAB — MAGNESIUM: Magnesium: 2 mg/dL (ref 1.5–2.5)

## 2010-09-04 MED ORDER — IOHEXOL 300 MG/ML  SOLN
135.0000 mL | Freq: Once | INTRAMUSCULAR | Status: AC | PRN
  Administered 2010-09-04: 125 mL via INTRAVENOUS

## 2010-09-05 LAB — CBC
Hemoglobin: 10.1 g/dL — ABNORMAL LOW (ref 12.0–15.0)
MCH: 24.5 pg — ABNORMAL LOW (ref 26.0–34.0)
MCHC: 30.9 g/dL (ref 30.0–36.0)

## 2010-09-05 LAB — COMPREHENSIVE METABOLIC PANEL
AST: 12 U/L (ref 0–37)
CO2: 28 mEq/L (ref 19–32)
Calcium: 8.9 mg/dL (ref 8.4–10.5)
Creatinine, Ser: 0.72 mg/dL (ref 0.4–1.2)
GFR calc Af Amer: >60 mL/min (ref >60)
GFR calc non Af Amer: >60 mL/min (ref >60)

## 2010-09-06 LAB — CBC
HCT: 31.4 % — ABNORMAL LOW (ref 36.0–46.0)
Hemoglobin: 10 g/dL — ABNORMAL LOW (ref 12.0–15.0)
MCHC: 31.8 g/dL (ref 30.0–36.0)
WBC: 11 10*3/uL — ABNORMAL HIGH (ref 4.0–10.5)

## 2010-09-06 LAB — BASIC METABOLIC PANEL
CO2: 27 mEq/L (ref 19–32)
Chloride: 99 mEq/L (ref 96–112)
Glucose, Bld: 102 mg/dL — ABNORMAL HIGH (ref 70–99)
Potassium: 3.6 mEq/L (ref 3.5–5.1)
Sodium: 133 mEq/L — ABNORMAL LOW (ref 135–145)

## 2010-09-06 LAB — URINE CULTURE: Culture  Setup Time: 201205200212

## 2010-09-07 LAB — BASIC METABOLIC PANEL
BUN: 3 mg/dL — ABNORMAL LOW (ref 6–23)
Chloride: 100 mEq/L (ref 96–112)
Creatinine, Ser: 0.75 mg/dL (ref 0.4–1.2)
GFR calc non Af Amer: >60 mL/min (ref >60)
Glucose, Bld: 92 mg/dL (ref 70–99)

## 2010-09-07 LAB — GC/CHLAMYDIA PROBE AMP, GENITAL
Chlamydia, DNA Probe: NEGATIVE
GC Probe Amp, Genital: NEGATIVE

## 2010-09-08 ENCOUNTER — Inpatient Hospital Stay (HOSPITAL_COMMUNITY): Payer: Medicaid Other

## 2010-09-08 LAB — URINALYSIS, ROUTINE W REFLEX MICROSCOPIC
Bilirubin Urine: NEGATIVE
Glucose, UA: NEGATIVE mg/dL
Ketones, ur: NEGATIVE mg/dL
Protein, ur: NEGATIVE mg/dL

## 2010-09-08 LAB — IRON AND TIBC: UIBC: 248 ug/dL

## 2010-09-08 MED ORDER — IOHEXOL 300 MG/ML  SOLN
100.0000 mL | Freq: Once | INTRAMUSCULAR | Status: AC | PRN
  Administered 2010-09-08: 100 mL via INTRAVENOUS

## 2010-09-09 LAB — CBC
HCT: 33.6 % — ABNORMAL LOW (ref 36.0–46.0)
Hemoglobin: 10.6 g/dL — ABNORMAL LOW (ref 12.0–15.0)
MCHC: 31.5 g/dL (ref 30.0–36.0)
RBC: 4.25 MIL/uL (ref 3.87–5.11)

## 2010-09-09 LAB — BASIC METABOLIC PANEL
CO2: 26 mEq/L (ref 19–32)
Calcium: 8.9 mg/dL (ref 8.4–10.5)
Chloride: 99 mEq/L (ref 96–112)
GFR calc Af Amer: >60 mL/min (ref >60)
Glucose, Bld: 107 mg/dL — ABNORMAL HIGH (ref 70–99)
Potassium: 3.8 mEq/L (ref 3.5–5.1)
Sodium: 134 mEq/L — ABNORMAL LOW (ref 135–145)

## 2010-09-10 LAB — URINE CULTURE
Colony Count: NO GROWTH
Culture: NO GROWTH

## 2010-09-11 LAB — CULTURE, BLOOD (ROUTINE X 2)
Culture  Setup Time: 201205200112
Culture: NO GROWTH

## 2010-09-12 NOTE — Discharge Summary (Signed)
Kara [name], Kara [name]               ACCOUNT NO.:  [id number]  MEDICAL RECORD NO.:  [id number]           PATIENT TYPE:  I  LOCATION:  1320                         FACILITY:  [facility]  PHYSICIAN:  Isidor Holts, M.D.  DATE OF BIRTH:  Sep 06, 1980  DATE OF ADMISSION:  09/04/2010 DATE OF DISCHARGE:  09/09/2010                              DISCHARGE SUMMARY   PRIMARY MD:  Fleet Contras, MD  DISCHARGE DIAGNOSES: 1. Acute mid sigmoid diverticulitis. 2. Chronic iron-deficiency anemia. 3. History of menorrhagia. 4. Hypertension. 5. Depression. 6. Smoking history.  DISCHARGE MEDICATIONS: 1. Ciprofloxacin 500 mg p.o. b.i.d. for 8 days. 2. Metronidazole 500 mg p.o. t.i.d. for 8 days. 3. Ibuprofen 400 mg p.o. p.r.n. q.8 hourly for pain. 4. Lamictal 50 mg p.o. daily. 5. Lisinopril/hydrochlorothiazide (10/12.5) 1 p.o. daily. 6. Metamucil over-the-counter 1 tablespoon p.o. daily.  PROCEDURES: 1. Transvaginal ultrasound scan Sep 04, 2010:  This showed normal     uterus, thickened endometrium, small complex cyst right ovary, no     adnexal masses, small free fluid. 2. Abdominal/pelvic CT scan Sep 04, 2010:  This showed lung fields     clear.  Heart size normal in size.  Liver and gallbladder and bile     ducts are normal.  Pancreas, spleen and kidneys are normal, no     hydronephrosis.  The uterus is normal in size, ovaries are not     enlarged.  2-cm soft tissue thickening in the subcutaneous fat     above the pubic symphysis.  This may be related to prior surgery     and scar tissue.  No hernia seen in this area.  There was     thickening of the sigmoid colon with edema in the surrounding fat.     The findings suggest acute infection from diverticulitis; this     extends over an approximately 10 cm segment.  Surgical clips seen     in the region of the appendix from prior appendectomy.  Free fluid     lateral to the cecum measures 3 x 5 cm.  Thickening of the sigmoid     colon with  surrounding edema.  Findings are compatible with sigmoid     diverticulitis.  A small amount of free fluid in the right lateral     gutter may be related to diverticulitis.  Negative for abscess. 3. Pelvic ultrasound scan Sep 06, 2010:  This shows a normal uterus.     There was thickened endometrium, small complex cyst right kidney.     No adnexal mass.  Small free fluid. 4. Abdominal/pelvic CT scan Sep 08, 2010:  This showed slight     improvement in mild mid sigmoid diverticulitis.  CONSULTATIONS:  None.  ADMISSION HISTORY:  As in H&P notes of Sep 04, 2010, dictated by Dr. Joeseph Amor.  However, in brief, this is a 30 year old female, with known history of morbid obesity, hypertension, menorrhagia, chronic anemia, anxiety, chronic back pain, depression, status post previous appendectomy, status post wisdom tooth removal, history of smoking, who presents with abdominal pain in the left side of the lower abdomen associated with  nausea, vomiting x1.  The patient was seen in the emergency department.  Pelvic ultrasound scan showed no evidence of acute OB/GYN problems.  Abdominal/pelvic CT scan demonstrated acute sigmoid diverticulitis.  The patient was therefore, admitted for further evaluation, investigation and management.  CLINICAL COURSE: 1. Acute sigmoid diverticulitis:  For details of presentation, refer     to admission history above.  The patient was managed with     intravenous Zosyn initially, but with clinical improvement, we were     able to subsequently transition her to oral ciprofloxacin and     Flagyl.  As of Sep 09, 2010, she was on day #6 of a planned total     course of 14 days of antibiotic therapy.  Followup abdominal/pelvic     CT scan of Sep 08, 2010, showed interval improvement of     diverticulitis and no evidence of intra-abdominal complications.     The patient has been reassured accordingly.  2. Chronic iron deficiency anemia:  The patient has a mild anemia  with     hemoglobin of 10.6 as of Sep 09, 2010.  She does have a history of     menorrhagia, which is likely to be the culprit, although pelvic     ultrasound scan showed no evidence of uterine fibroids, nor did the CT     scan.  Iron studies showed serum iron 51, TIBC 299, percent     saturation 17, ferritin 34.  3. Hypertension:  The patient remained normotensive on preadmission     antihypertensive.  4. History of depression:  The patient was clinically stable from this     viewpoint.  5. Smoking history:  The patient unfortunately continues to smoke.     She has been counseled appropriately.  DISPOSITION:  The patient was on Sep 09, 2010, considered clinically stable for discharge and was therefore discharged accordingly.  She did complain of dysuria on Sep 08, 2010.  However, urinalysis showed no evidence of UTI and she was she was therefore empirically treated with a single dose of Diflucan 150 mg, to cover the possibility of candidiasis secondary to antibiotic therapy.  As of Sep 09, 2010, she no longer complained of dysuria.  The patient was discharged on Sep 09, 2010.  ACTIVITY:  As tolerated.  Recommended to increase activity slowly.  DIET:  Heart-healthy, high-fiber diet.  FOLLOWUP INSTRUCTIONS:  The patient is to follow up with her primary MD, Dr. Fleet Contras, next week.  She has been instructed to call for an appointment;  telephone number is [phone].  SPECIAL INSTRUCTIONS:  Dr. Concepcion Elk is recommended to arrange gastroenterology referral following resolution of the patient's acute diverticulitis, given her relatively young age for this malady.     Isidor Holts, M.D.     CO/MEDQ  D:  09/09/2010  T:  09/09/2010  Job:  [id number]  cc:   Fleet Contras, M.D. Fax: [phone]  Electronically Signed by Isidor Holts M.D. on 09/12/2010 06:02:08 PM

## 2010-09-16 ENCOUNTER — Encounter (HOSPITAL_COMMUNITY): Payer: Self-pay | Admitting: Radiology

## 2010-09-16 ENCOUNTER — Emergency Department (HOSPITAL_COMMUNITY): Payer: Medicaid Other

## 2010-09-16 ENCOUNTER — Emergency Department (HOSPITAL_COMMUNITY)
Admission: EM | Admit: 2010-09-16 | Discharge: 2010-09-16 | Disposition: A | Discharge status: Home / Self Care | Payer: Medicaid Other | Attending: Internal Medicine | Admitting: Internal Medicine

## 2010-09-16 DIAGNOSIS — D259 Leiomyoma of uterus, unspecified: Secondary | ICD-10-CM | POA: Insufficient documentation

## 2010-09-16 DIAGNOSIS — R112 Nausea with vomiting, unspecified: Secondary | ICD-10-CM | POA: Insufficient documentation

## 2010-09-16 DIAGNOSIS — Z79899 Other long term (current) drug therapy: Secondary | ICD-10-CM | POA: Insufficient documentation

## 2010-09-16 DIAGNOSIS — I2510 Atherosclerotic heart disease of native coronary artery without angina pectoris: Secondary | ICD-10-CM | POA: Insufficient documentation

## 2010-09-16 DIAGNOSIS — F329 Major depressive disorder, single episode, unspecified: Secondary | ICD-10-CM | POA: Insufficient documentation

## 2010-09-16 DIAGNOSIS — F411 Generalized anxiety disorder: Secondary | ICD-10-CM | POA: Insufficient documentation

## 2010-09-16 DIAGNOSIS — K5732 Diverticulitis of large intestine without perforation or abscess without bleeding: Secondary | ICD-10-CM | POA: Insufficient documentation

## 2010-09-16 DIAGNOSIS — F3289 Other specified depressive episodes: Secondary | ICD-10-CM | POA: Insufficient documentation

## 2010-09-16 DIAGNOSIS — I10 Essential (primary) hypertension: Secondary | ICD-10-CM | POA: Insufficient documentation

## 2010-09-16 HISTORY — DX: Essential (primary) hypertension: I10

## 2010-09-16 LAB — COMPREHENSIVE METABOLIC PANEL
ALT: 13 U/L (ref 0–35)
AST: 12 U/L (ref 0–37)
Albumin: 3.7 g/dL (ref 3.5–5.2)
CO2: 24 mEq/L (ref 19–32)
Calcium: 9.5 mg/dL (ref 8.4–10.5)
Chloride: 102 mEq/L (ref 96–112)
Creatinine, Ser: 0.86 mg/dL (ref 0.4–1.2)
GFR calc Af Amer: >60 mL/min (ref >60)
GFR calc non Af Amer: >60 mL/min (ref >60)
Sodium: 136 mEq/L (ref 135–145)
Total Bilirubin: 0.3 mg/dL (ref 0.3–1.2)

## 2010-09-16 LAB — URINE MICROSCOPIC-ADD ON

## 2010-09-16 LAB — CBC
Hemoglobin: 10.8 g/dL — ABNORMAL LOW (ref 12.0–15.0)
Platelets: 421 10*3/uL — ABNORMAL HIGH (ref 150–400)
RBC: 4.27 MIL/uL (ref 3.87–5.11)
WBC: 9.8 10*3/uL (ref 4.0–10.5)

## 2010-09-16 LAB — URINALYSIS, ROUTINE W REFLEX MICROSCOPIC
Glucose, UA: NEGATIVE mg/dL
Hgb urine dipstick: NEGATIVE
Protein, ur: NEGATIVE mg/dL
pH: 5.5 (ref 5.0–8.0)

## 2010-09-16 MED ORDER — IOHEXOL 300 MG/ML  SOLN
100.0000 mL | Freq: Once | INTRAMUSCULAR | Status: AC | PRN
  Administered 2010-09-16: 100 mL via INTRAVENOUS

## 2010-09-17 LAB — URINE CULTURE

## 2010-09-29 ENCOUNTER — Other Ambulatory Visit: Payer: Self-pay | Admitting: Gastroenterology

## 2010-09-29 DIAGNOSIS — K5792 Diverticulitis of intestine, part unspecified, without perforation or abscess without bleeding: Secondary | ICD-10-CM

## 2010-09-30 ENCOUNTER — Ambulatory Visit
Admission: RE | Admit: 2010-09-30 | Discharge: 2010-09-30 | Disposition: A | Discharge status: Home / Self Care | Payer: Medicaid Other | Source: Ambulatory Visit | Attending: Gastroenterology | Admitting: Gastroenterology

## 2010-09-30 DIAGNOSIS — K5792 Diverticulitis of intestine, part unspecified, without perforation or abscess without bleeding: Secondary | ICD-10-CM

## 2010-09-30 MED ORDER — IOHEXOL 300 MG/ML  SOLN
125.0000 mL | Freq: Once | INTRAMUSCULAR | Status: AC | PRN
  Administered 2010-09-30: 125 mL via INTRAVENOUS

## 2010-10-27 ENCOUNTER — Emergency Department (HOSPITAL_COMMUNITY)
Admission: EM | Admit: 2010-10-27 | Discharge: 2010-10-27 | Disposition: A | Discharge status: Home / Self Care | Payer: Medicaid Other | Attending: Emergency Medicine | Admitting: Emergency Medicine

## 2010-10-27 DIAGNOSIS — I2510 Atherosclerotic heart disease of native coronary artery without angina pectoris: Secondary | ICD-10-CM | POA: Insufficient documentation

## 2010-10-27 DIAGNOSIS — F411 Generalized anxiety disorder: Secondary | ICD-10-CM | POA: Insufficient documentation

## 2010-10-27 DIAGNOSIS — F3289 Other specified depressive episodes: Secondary | ICD-10-CM | POA: Insufficient documentation

## 2010-10-27 DIAGNOSIS — I10 Essential (primary) hypertension: Secondary | ICD-10-CM | POA: Insufficient documentation

## 2010-10-27 DIAGNOSIS — F329 Major depressive disorder, single episode, unspecified: Secondary | ICD-10-CM | POA: Insufficient documentation

## 2010-10-27 DIAGNOSIS — K5730 Diverticulosis of large intestine without perforation or abscess without bleeding: Secondary | ICD-10-CM | POA: Insufficient documentation

## 2010-10-27 DIAGNOSIS — L02419 Cutaneous abscess of limb, unspecified: Secondary | ICD-10-CM | POA: Insufficient documentation

## 2010-10-27 DIAGNOSIS — D259 Leiomyoma of uterus, unspecified: Secondary | ICD-10-CM | POA: Insufficient documentation

## 2010-10-27 DIAGNOSIS — L03119 Cellulitis of unspecified part of limb: Secondary | ICD-10-CM | POA: Insufficient documentation

## 2010-11-01 NOTE — Consult Note (Signed)
Kara [name], Kara [name]               ACCOUNT NO.:  [id number]  MEDICAL RECORD NO.:  [id number]           PATIENT TYPE:  E  LOCATION:  WLED                         [facility]AC[facility]  PHYSICIAN:  Altha Harm, MDDATE OF BIRTH:  [birth year]  DATE OF CONSULTATION:  09/16/2010 DATE OF DISCHARGE:  09/16/2010                                CONSULTATION   ER Consultation  CHIEF COMPLAINT:  Left-sided abdominal pain.  HISTORY OF PRESENT ILLNESS:  Kara [name] is a 29-year obese African- American female, who was recently treated for sigmoid diverticulitis. The patient states that she has been eating normally without any change in her appetite; however, last night she started having some sharp pains in the left lower quadrant.  The patient states that she had been taking ibuprofen for any pain that she has; however, the ibuprofen did not relieve her pain sufficiently and so she came to the Emergency Room for further evaluation.  The patient has had no constipation or diarrhea. She has had no fevers, no chills.  She has had no weight loss.  She has had no nausea, no vomiting.  The patient has been on Flagyl and ciprofloxacin to complete a 14-day course and has been taking it without any difficulty.  The patient does state that she has a vaginal discharge; however, it is clear and states which is likely indicative of the normal variation in her menstrual cycle.  The patient is approximately 17 days into her menstrual cycle.  At this time, her last menstrual period she thinks began on Aug 21, 2010.  PAST MEDICAL HISTORY:  Significant for: 1. Recently diagnosed and currently being treated sigmoid     diverticulitis. 2. Anemia. 3. Hypertension. 4. Depression. 5. Chronic back pain.  FAMILY HISTORY:  Significant for hypertension in her father and brother and diverticulitis in her mother.  SOCIAL HISTORY:  The patient is currently unemployed.  She has two children with whom she resides.   She is recently reformed with tobacco use approximately 2 months and there is no alcohol or drug use.  CURRENT MEDICATIONS:  Include the following: 1. Lamictal 25 mg two tablets p.o. daily. 2. Flagyl 500 mg p.o. t.i.d. 3. Ciprofloxacin 500 mg p.o. b.i.d. 4. Bepreve 1.5% ophthalmic solution 1 drop to both eyes twice daily as     needed for dry eyes. 5. Metamucil powder 1 tablespoon daily. 6. Iron over-the-counter 1 tablet p.o. daily. 7. Lisinopril/hydrochlorothiazide 10/12.5 mg p.o. daily. 8. Ibuprofen 1000 mg p.o. daily.  ALLERGIES: 1. SULFA. 2. IODINE. 3. CODEINE.  The patient denies allergy to Vicodin, Lortab or Norco.  She emphatically states that although this is listed as an allergy this is not an allergy of hers.  PRIMARY CARE PHYSICIAN:  Dr. Concepcion Elk.  REVIEW OF SYSTEMS:  Al other systems negative.  STUDIES IN THE EMERGENCY ROOM:  Patient had a CT scan of her abdomen performed this morning, which showed mild post-infectious or inflammatory changes of prior sigmoid diverticulitis without acute abnormality.  It is little change from prior.  The hemogram shows a white blood cell count of 9.8, hemoglobin of 10.8, hematocrit of 33.5,  platelet count of 421,000.  Sodium of 136, potassium 3.5, chloride 102, bicarb 24, BUN 11, creatinine 0.86.  Lipase is normal at 42.  Urinalysis shows 3-6 WBCs without any nitrites.  PHYSICAL EXAMINATION:  GENERAL:  The patient is lying in bed, in no acute distress. VITAL SIGNS:  Temperature is 98.8, heart rate 73, blood pressure 157/85, respiratory rate 18, O2 sats were 98% on room air. HEENT EXAMINATION:  She is normocephalic, atraumatic.  Pupils are equally round and reactive to light and accommodation.  Extraocular movements are intact.  Oropharynx is moist.  No exudate, erythema or lesions are noted. NECK EXAMINATION:  Trachea midline.  No masses.  No thyromegaly.  No JVD.  No carotid bruits. RESPIRATORY EXAMINATION:  The patient has a  normal respiratory effort with equal excursion bilaterally.  No wheezing or rhonchi noted. CARDIOVASCULAR:  She has got a normal S1 and S2.  No murmurs, rubs or gallops are noted.  PMI is nondisplaced.  No heaves or thrills on palpation. ABDOMEN:  Obese.  It is soft.  She has got some mild tenderness in the left lower quadrant mostly over the areas where the ovaries are.  She has no guarding.  She has no rebound.  There are no masses.  No hepatosplenomegaly is noted. NEUROLOGICAL:  The patient has no focal neurological deficits..  Cranial nerves II through XII are grossly intact. PSYCHIATRIC:  She is alert and oriented x3.  Good insight and cognition. Good recent or remote recall.  ASSESSMENT AND PLAN:  This is a patient who presents to the  hospital with some mild left lower quadrant pain unaccompanied by any signs or symptoms of infection.  The patient's eating and elimination has not been affected by the pain.  I suspect that this patient is having mittelschmerz pain as she is at the right point her cycle for this and given the character of the pain and the onset that would be my diagnosis.  Nevertheless, at this point this patient does not want admission.  I recommend that she continues with her antibiotics as previously described.  I have discussed with Dr. Vida Rigger, who has reviewed the patient's radiologic studies and depending on my physical evaluation of the patient.  He is in agreement that this does not represented a failed treatment of her diverticulitis.  He would be willing to see the patient within the office in the next few days and advised that the patient call the office and ask for an appointment to be set up with himself or one of his partners in the next 72 hours.  I will give the patient some Vicodin to take for melioration of the pain. The patient has been taking ibuprofen on consistent basis for chronic back pain and she has been taking large amounts.  This  places the patient a risk for gastrointestinal bleed of her renal damage.  Of caution, the patient against further using this have to see her primary care physician, Dr. Concepcion Elk for referral for her chronic pain either to pain clinic or to physical therapy.  At this point, this patient states discharge from the Emergency Room and as my recommendations, the patient be discharged, followup with the primary care physician within a week and followup with Dr. [facility], which [facility] for an appointment to be scheduled by her nurse today or tomorrow.  Total time for this consult 1 hour.     Altha Harm, MD     MAM/MEDQ  D:  09/16/2010  T:  09/16/2010  Job:  [id number]  cc:   Dr. Concepcion Elk.  Petra Kuba, M.D. Fax: [phone]  Electronically Signed by Marthann Schiller MD on 11/01/2010 05:24:10 PM

## 2010-12-02 NOTE — H&P (Signed)
NAMEDIANE, HANEL               ACCOUNT NO.:  [id number]  MEDICAL RECORD NO.:  [id number]           PATIENT TYPE:  E  LOCATION:  WLED                         FACILITY:  [facility]  PHYSICIAN:  Donalynn Furlong, MD      DATE OF BIRTH:  1980-12-22  DATE OF ADMISSION:  09/04/2010 DATE OF DISCHARGE:                             HISTORY & PHYSICAL   PRIMARY CARE PROVIDER:  Dr. Elmer Bales  CHIEF COMPLAINT:  Abdominal pain, nausea, vomiting.  HISTORY OF PRESENT ILLNESS:  Ms. Sadiq is a 30 year old African American female who lives in [place] with her kids.  She presented to [facility] Emergency Room through EMS today.  She has been having abdominal pain for a couple of days.  Her abdominal pain progressively got worse. It was located all over the abdomen.  Her left side lower abdomen was hurting more.  Pain radiated to her left back and she thought she had a kidney stone, that is why she decided to come to the hospital.  She also had an episode of nausea and vomiting today x1.  She mentions that she never has a history of diverticulitis.  Here, ultrasound was done which did not show any OB-GYN problem.  CT scan showed acute sigmoid diverticulitis.  The patient mentions that she has never had any abdominal surgery in the past.  She denies any other symptoms.  She denies any fevers or chills.  PAST MEDICAL HISTORY: 1. Hypertension. 2. Anemia from menorrhagia. 3. Uterine fibroids. 4. Anxiety. 5. Chronic back pain. 6. Depression. 7. Appendectomy. 8. Wisdom tooth removal. 9. Tobacco use.  PAST SURGICAL HISTORY:  As per past medical history.  SOCIAL HISTORY:  The patient mentions she continues to smoke at this time.  She drinks alcohol socially.  She denies any illicit drug use.  FAMILY HISTORY:  Positive for coronary artery disease, cancer, diabetes, hypertension.  Nothing contributory to the current illness.  REVIEW OF SYSTEMS:  Positive as per HPI, otherwise negative review  of systems, done 14 systems.  MEDICATION LIST:  Includes lisinopril, hydrochlorothiazide, Lamictal, and ibuprofen.  ALLERGIES TO MEDICATIONS: 1. BETADINE. 2. SULFA. 3. CODEINE.  PHYSICAL EXAMINATION:  VITAL SIGNS:  Blood pressure 127/61, pulse 72, respirations 16, temperature 98.9, oxygen saturation 100%. GENERAL EXAM:  Alert, oriented x3, lying in bed on one side due to abdominal pain and eating ice chips. CARDIOVASCULAR:  S1, S2 regular.  No murmur, rub, or gallop. LUNGS:  Clear to auscultation bilaterally.  No wheezing or crackles. ABDOMEN:  Nondistended.  Bowel sounds present.  The patient does have diffuse tenderness which is very mild.  No rebound tenderness noted. The patient does have more tenderness over the left lower quadrant without any guarding, rigidity, or rebound.  The patient does not have any pain or tenderness over the bilateral flank area. EXTREMITIES:  No clubbing, cyanosis, or edema.  Pulses palpable in all 4 extremities. HEENT:  Head normocephalic, nontraumatic.  Eyes, pupils are equal and reactive to light and accommodation.  Extraocular muscles intact.  Oral cavity, oral mucosa moist.  No thrush noted. NECK:  No thyromegaly or JVD. SKIN:  No  rashes or bruits. NEUROLOGICAL EXAM:  Shows intact cranial nerves, muscle strength, sensation, and reflexes.  LABORATORY DATA:  Shows hemoglobin 11.1, WBC 11.2.  Comprehensive metabolic panel with sodium 133, glucose 124.  Urine pregnancy test negative.  Urinalysis unremarkable for any infection.  Ultrasound of the abdomen shows normal uterus, thickened endometrium, small complex cyst in right ovary, no adnexal mass, small free fluid.  This was a transvaginal ultrasound.  CT abdomen and pelvis with contrast shows thickening of the sigmoid colon with surrounding edema suggestive of sigmoid diverticulitis, small amount of free fluid in the right lateral gutter, may be related to diverticulitis, negative for  abscess.  ASSESSMENT: 1. Acute sigmoid diverticulitis. 2. Leukocytosis secondary to diverticulitis. 3. 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 4. Hypertension. 5. Depression. 6. Tobacco use.  PLAN:  The patient will be admitted to regular bed with a diagnosis of sigmoid diverticulitis, anemia, under [facility] team #2.  The patient is full code.  We will check vitals every 4 hours.  We will keep her n.p.o. except ice chips, water, and medications.  We will check magnesium, phosphate, CBC, CMP in the morning.  We will check urine cultures and blood cultures at this time.  If the patient's abdominal pain does not improve, we should consider a General Surgery consult. This is her first episode of diverticulitis.  We will hydrate her with normal saline 50 cc/hour for 2 L.  We will put SCDs on her legs for DVT prophylaxis.  We will provide Zofran p.r.n. for any nausea or vomiting.  We will provide pain medication including Lortab and morphine p.r.n. for pain.  We will start IV piperacillin/tazobactam 3.375 g q.6 h. for diverticulitis treatment.     Donalynn Furlong, MD     TVP/MEDQ  D:  09/04/2010  T:  09/04/2010  Job:  [id number]  cc:   Dr. Elmer Bales  Electronically Signed by Virginia Rochester M.D. on 12/02/2010 08:34:16 AM

## 2010-12-10 ENCOUNTER — Inpatient Hospital Stay (HOSPITAL_COMMUNITY)
Admission: AD | Admit: 2010-12-10 | Discharge: 2010-12-10 | Disposition: A | Discharge status: Home / Self Care | Payer: Medicaid Other | Source: Ambulatory Visit | Attending: Obstetrics & Gynecology | Admitting: Obstetrics & Gynecology

## 2010-12-10 ENCOUNTER — Encounter (HOSPITAL_COMMUNITY): Payer: Self-pay

## 2010-12-10 DIAGNOSIS — R109 Unspecified abdominal pain: Secondary | ICD-10-CM | POA: Insufficient documentation

## 2010-12-10 DIAGNOSIS — N912 Amenorrhea, unspecified: Secondary | ICD-10-CM | POA: Insufficient documentation

## 2010-12-10 HISTORY — DX: Major depressive disorder, single episode, unspecified: F32.9

## 2010-12-10 HISTORY — DX: Anxiety disorder, unspecified: F41.9

## 2010-12-10 HISTORY — DX: Depression, unspecified: F32.A

## 2010-12-10 LAB — CBC
MCH: 26.2 pg (ref 26.0–34.0)
MCHC: 31.7 g/dL (ref 30.0–36.0)
MCV: 82.5 fL (ref 78.0–100.0)
Platelets: 366 10*3/uL (ref 150–400)
RDW: 16.6 % — ABNORMAL HIGH (ref 11.5–15.5)

## 2010-12-10 LAB — DIFFERENTIAL
Basophils Absolute: 0 10*3/uL (ref 0.0–0.1)
Basophils Relative: 1 % (ref 0–1)
Eosinophils Absolute: 0.2 10*3/uL (ref 0.0–0.7)
Eosinophils Relative: 2 % (ref 0–5)
Lymphs Abs: 2.6 10*3/uL (ref 0.7–4.0)
Neutrophils Relative %: 54 % (ref 43–77)

## 2010-12-10 LAB — WET PREP, GENITAL
Trich, Wet Prep: NONE SEEN
Yeast Wet Prep HPF POC: NONE SEEN

## 2010-12-10 LAB — POCT PREGNANCY, URINE: Preg Test, Ur: NEGATIVE

## 2010-12-10 LAB — HCG, QUANTITATIVE, PREGNANCY: hCG, Beta Chain, Quant, S: 1 m[IU]/mL (ref <5)

## 2010-12-10 NOTE — ED Provider Notes (Signed)
History     CSN: [id number] Arrival date & time: 12/10/2010 10:52 AM  Chief Complaint  Patient presents with  . Possible Pregnancy   HPI Kara [name] is a 30 y.o. AA female who presents to [facility] for low abdominal pain that started 6 days ago. She took a HPT that came out positive. She had a BTL 2 years ago. The pain has been constant and she rates it 3/10.   Past Medical History  Diagnosis Date  . Hypertension   . Diverticulitis   . Depression   . Anxiety   . Chronic back pain     Past Surgical History  Procedure Date  . Tubal ligation   . Cesarean section   . Appendectomy     No family history on file.  History  Substance Use Topics  . Smoking status: Current Everyday Smoker -- 0.5 packs/day  . Smokeless tobacco: Not on file  . Alcohol Use: No    OB History    Grav Para Term Preterm Abortions TAB SAB Ect Mult Living   6 2 2  3  3   2       Review of Systems  Constitutional: Negative for fever, chills, diaphoresis and fatigue.  HENT: Negative for ear pain, congestion, sore throat, facial swelling, neck pain, neck stiffness, dental problem and sinus pressure.   Eyes: Negative for photophobia, pain and discharge.  Respiratory: Negative for cough, chest tightness and wheezing.   Cardiovascular: Negative for chest pain and leg swelling.  Gastrointestinal: Positive for abdominal pain. Negative for nausea, vomiting, diarrhea, constipation and abdominal distention.  Genitourinary: Positive for vaginal discharge. Negative for dysuria, frequency, vaginal bleeding and difficulty urinating.  Musculoskeletal: Positive for back pain. Negative for myalgias and gait problem.  Skin: Negative for color change and rash.  Neurological: Negative for dizziness, speech difficulty, weakness, light-headedness, numbness and headaches.  Psychiatric/Behavioral:       Depression and anxiety    Physical Exam  BP 126/78  Pulse 72  Temp(Src) 98.9 F (37.2 C) (Oral)  Resp 18  Ht 5'  2.75" (1.594 m)  Wt 228 lb 8 oz (103.647 kg)  BMI 40.80 kg/m2  LMP 11/02/2010  Physical Exam  Nursing note and vitals reviewed. Constitutional: She is oriented to person, place, and time.       obese  HENT:  Head: Normocephalic and atraumatic.  Eyes: EOM are normal.  Neck: Normal range of motion. Neck supple.  Cardiovascular: Normal rate and regular rhythm.   Pulmonary/Chest: Effort normal and breath sounds normal.  Abdominal: Soft. There is tenderness.    Genitourinary:       White vaginal discharge. Cervix closed, no CMT. No adnexal tenderness. Uterus no palpable enlargement.  Musculoskeletal: Normal range of motion.       Right shoulder: She exhibits tenderness.       Arms: Neurological: She is alert and oriented to person, place, and time. No cranial nerve deficit.  Skin: Skin is warm and dry.    ED Course  Procedures  MDM Results for orders placed during the hospital encounter of 12/10/10 (from the past 24 hour(s))  POCT PREGNANCY, URINE     Status: Normal   Collection Time   12/10/10 11:27 AM      Component Value Range   Preg Test, Ur NEGATIVE    CBC     Status: Abnormal   Collection Time   12/10/10 12:10 PM      Component Value Range   WBC 7.3  4.0 - 10.5 (K/uL)   RBC 4.28  3.87 - 5.11 (MIL/uL)   Hemoglobin 11.2 (*) 12.0 - 15.0 (g/dL)   HCT 47.8 (*) 29.5 - 46.0 (%)   MCV 82.5  78.0 - 100.0 (fL)   MCH 26.2  26.0 - 34.0 (pg)   MCHC 31.7  30.0 - 36.0 (g/dL)   RDW 62.1 (*) 30.8 - 15.5 (%)   Platelets 366  150 - 400 (K/uL)  DIFFERENTIAL     Status: Normal   Collection Time   12/10/10 12:10 PM      Component Value Range   Neutrophils Relative 54  43 - 77 (%)   Neutro Abs 3.9  1.7 - 7.7 (K/uL)   Lymphocytes Relative 36  12 - 46 (%)   Lymphs Abs 2.6  0.7 - 4.0 (K/uL)   Monocytes Relative 7  3 - 12 (%)   Monocytes Absolute 0.5  0.1 - 1.0 (K/uL)   Eosinophils Relative 2  0 - 5 (%)   Eosinophils Absolute 0.2  0.0 - 0.7 (K/uL)   Basophils Relative 1  0 - 1 (%)    Basophils Absolute 0.0  0.0 - 0.1 (K/uL)  HCG, QUANTITATIVE, PREGNANCY     Status: Normal   Collection Time   12/10/10 12:10 PM      Component Value Range   hCG, Beta Chain, Quant, S 1  <5 (mIU/mL)  WET PREP, GENITAL     Status: Abnormal   Collection Time   12/10/10 12:40 PM      Component Value Range   Yeast, Wet Prep NONE SEEN  NONE SEEN    Trich, Wet Prep NONE SEEN  NONE SEEN    Clue Cells, Wet Prep FEW (*) NONE SEEN    WBC, Wet Prep HPF POC NONE SEEN  NONE SEEN   .    Assessment : Amenorrhea  Plan: Discussed with patient negative pregnancy test by urine and blood. She will f/u with her GYN and return here as needed.

## 2010-12-10 NOTE — Progress Notes (Signed)
Pt had +upt at home 4 days ago, tubal done 2 years ago. Having lower pelvic "bloating". Clear vaginal d/c thin, non-odorous.

## 2010-12-11 LAB — GC/CHLAMYDIA PROBE AMP, GENITAL
Chlamydia, DNA Probe: NEGATIVE
GC Probe Amp, Genital: NEGATIVE

## 2011-01-17 LAB — URINALYSIS, ROUTINE W REFLEX MICROSCOPIC
Bilirubin Urine: NEGATIVE
Hgb urine dipstick: NEGATIVE
Ketones, ur: 15 — AB
Specific Gravity, Urine: 1.02
Urobilinogen, UA: 0.2

## 2011-01-17 LAB — CBC
Hemoglobin: 12.3
MCHC: 32.2
MCV: 81.9
RBC: 4.67
RDW: 14.8

## 2011-01-17 LAB — DIFFERENTIAL
Eosinophils Absolute: 0.1
Eosinophils Relative: 1
Lymphs Abs: 1.2

## 2011-01-28 LAB — BASIC METABOLIC PANEL
CO2: 26
Calcium: 9.2
Creatinine, Ser: 0.72
GFR calc Af Amer: >60

## 2011-01-28 LAB — DIFFERENTIAL
Basophils Absolute: 0
Basophils Relative: 1
Monocytes Relative: 7
Neutro Abs: 4.1
Neutrophils Relative %: 57

## 2011-01-28 LAB — CBC
MCHC: 32.7
Platelets: 330
RBC: 4.29
RDW: 14.9 — ABNORMAL HIGH

## 2011-08-17 DIAGNOSIS — N898 Other specified noninflammatory disorders of vagina: Secondary | ICD-10-CM

## 2011-08-17 DIAGNOSIS — G5601 Carpal tunnel syndrome, right upper limb: Secondary | ICD-10-CM

## 2011-08-17 HISTORY — DX: Other specified noninflammatory disorders of vagina: N89.8

## 2011-08-17 HISTORY — DX: Carpal tunnel syndrome, right upper limb: G56.01

## 2011-08-19 ENCOUNTER — Other Ambulatory Visit: Payer: Self-pay | Admitting: Orthopedic Surgery

## 2011-08-22 ENCOUNTER — Encounter (HOSPITAL_BASED_OUTPATIENT_CLINIC_OR_DEPARTMENT_OTHER): Payer: Self-pay | Admitting: *Deleted

## 2011-08-22 ENCOUNTER — Encounter (HOSPITAL_BASED_OUTPATIENT_CLINIC_OR_DEPARTMENT_OTHER)
Admission: RE | Admit: 2011-08-22 | Discharge: 2011-08-22 | Disposition: A | Discharge status: Home / Self Care | Payer: Medicaid Other | Source: Ambulatory Visit | Attending: Orthopedic Surgery | Admitting: Orthopedic Surgery

## 2011-08-22 LAB — BASIC METABOLIC PANEL
Chloride: 103 mEq/L (ref 96–112)
GFR calc Af Amer: >90 mL/min (ref >90)
Potassium: 4.3 mEq/L (ref 3.5–5.1)
Sodium: 138 mEq/L (ref 135–145)

## 2011-08-22 NOTE — Pre-Procedure Instructions (Signed)
To come for BMET and EKG 

## 2011-08-23 ENCOUNTER — Ambulatory Visit (HOSPITAL_BASED_OUTPATIENT_CLINIC_OR_DEPARTMENT_OTHER): Payer: Medicaid Other | Admitting: Anesthesiology

## 2011-08-23 ENCOUNTER — Encounter (HOSPITAL_BASED_OUTPATIENT_CLINIC_OR_DEPARTMENT_OTHER): Payer: Self-pay | Admitting: Anesthesiology

## 2011-08-23 ENCOUNTER — Encounter (HOSPITAL_BASED_OUTPATIENT_CLINIC_OR_DEPARTMENT_OTHER)
Admission: RE | Disposition: A | Discharge status: Home / Self Care | Payer: Self-pay | Source: Ambulatory Visit | Attending: Orthopedic Surgery

## 2011-08-23 ENCOUNTER — Encounter (HOSPITAL_BASED_OUTPATIENT_CLINIC_OR_DEPARTMENT_OTHER): Payer: Self-pay | Admitting: Orthopedic Surgery

## 2011-08-23 ENCOUNTER — Ambulatory Visit (HOSPITAL_BASED_OUTPATIENT_CLINIC_OR_DEPARTMENT_OTHER)
Admission: RE | Admit: 2011-08-23 | Discharge: 2011-08-23 | Disposition: A | Discharge status: Home / Self Care | Payer: Medicaid Other | Source: Ambulatory Visit | Attending: Orthopedic Surgery | Admitting: Orthopedic Surgery

## 2011-08-23 DIAGNOSIS — G5600 Carpal tunnel syndrome, unspecified upper limb: Secondary | ICD-10-CM | POA: Insufficient documentation

## 2011-08-23 DIAGNOSIS — Z01812 Encounter for preprocedural laboratory examination: Secondary | ICD-10-CM | POA: Insufficient documentation

## 2011-08-23 DIAGNOSIS — I10 Essential (primary) hypertension: Secondary | ICD-10-CM | POA: Insufficient documentation

## 2011-08-23 DIAGNOSIS — K219 Gastro-esophageal reflux disease without esophagitis: Secondary | ICD-10-CM | POA: Insufficient documentation

## 2011-08-23 DIAGNOSIS — Z01810 Encounter for preprocedural cardiovascular examination: Secondary | ICD-10-CM | POA: Insufficient documentation

## 2011-08-23 HISTORY — DX: Diverticulosis of large intestine without perforation or abscess without bleeding: K57.30

## 2011-08-23 HISTORY — DX: Anemia, unspecified: D64.9

## 2011-08-23 HISTORY — DX: Carpal tunnel syndrome, right upper limb: G56.01

## 2011-08-23 HISTORY — PX: CARPAL TUNNEL RELEASE: SHX101

## 2011-08-23 HISTORY — DX: Other specified noninflammatory disorders of vagina: N89.8

## 2011-08-23 LAB — POCT HEMOGLOBIN-HEMACUE: Hemoglobin: 10.3 g/dL — ABNORMAL LOW (ref 12.0–15.0)

## 2011-08-23 SURGERY — CARPAL TUNNEL RELEASE
Anesthesia: General | Site: Hand | Laterality: Right | Wound class: Clean

## 2011-08-23 MED ORDER — CHLORHEXIDINE GLUCONATE 4 % EX LIQD: 60.0000 mL | Freq: Once | CUTANEOUS | Status: DC

## 2011-08-23 MED ORDER — PROPOFOL 10 MG/ML IV EMUL
INTRAVENOUS | Status: DC | PRN
  Administered 2011-08-23: 250 mg via INTRAVENOUS
  Administered 2011-08-23: 50 mg via INTRAVENOUS

## 2011-08-23 MED ORDER — ONDANSETRON HCL 4 MG/2ML IJ SOLN
INTRAMUSCULAR | Status: DC | PRN
  Administered 2011-08-23: 4 mg via INTRAVENOUS

## 2011-08-23 MED ORDER — FENTANYL CITRATE 0.05 MG/ML IJ SOLN
INTRAMUSCULAR | Status: DC | PRN
  Administered 2011-08-23 (×2): 50 ug via INTRAVENOUS

## 2011-08-23 MED ORDER — LACTATED RINGERS IV SOLN
INTRAVENOUS | Status: DC
  Administered 2011-08-23: 11:00:00 via INTRAVENOUS

## 2011-08-23 MED ORDER — MIDAZOLAM HCL 5 MG/5ML IJ SOLN
INTRAMUSCULAR | Status: DC | PRN
  Administered 2011-08-23: 2 mg via INTRAVENOUS

## 2011-08-23 MED ORDER — BUPIVACAINE HCL (PF) 0.25 % IJ SOLN
INTRAMUSCULAR | Status: DC | PRN
  Administered 2011-08-23: 10 mL

## 2011-08-23 MED ORDER — FENTANYL CITRATE 0.05 MG/ML IJ SOLN: 25.0000 ug | INTRAMUSCULAR | Status: DC | PRN

## 2011-08-23 MED ORDER — OXYCODONE-ACETAMINOPHEN 5-325 MG PO TABS: ORAL_TABLET | ORAL | Status: AC

## 2011-08-23 MED ORDER — DEXAMETHASONE SODIUM PHOSPHATE 10 MG/ML IJ SOLN
INTRAMUSCULAR | Status: DC | PRN
  Administered 2011-08-23: 10 mg via INTRAVENOUS

## 2011-08-23 MED ORDER — LIDOCAINE HCL (CARDIAC) 20 MG/ML IV SOLN
INTRAVENOUS | Status: DC | PRN
  Administered 2011-08-23: 50 mg via INTRAVENOUS

## 2011-08-23 MED ORDER — CEFAZOLIN SODIUM 1-5 GM-% IV SOLN: 1.0000 g | INTRAVENOUS | Status: DC

## 2011-08-23 SURGICAL SUPPLY — 36 items
BANDAGE ELASTIC 3 VELCRO ST LF (GAUZE/BANDAGES/DRESSINGS) ×2 IMPLANT
BANDAGE GAUZE ELAST BULKY 4 IN (GAUZE/BANDAGES/DRESSINGS) ×2 IMPLANT
BLADE MINI RND TIP GREEN BEAV (BLADE) IMPLANT
BLADE SURG 15 STRL LF DISP TIS (BLADE) ×2 IMPLANT
BLADE SURG 15 STRL SS (BLADE) ×4
BNDG CMPR 9X4 STRL LF SNTH (GAUZE/BANDAGES/DRESSINGS) ×1
BNDG ESMARK 4X9 LF (GAUZE/BANDAGES/DRESSINGS) ×1 IMPLANT
CHLORAPREP W/TINT 26ML (MISCELLANEOUS) ×2 IMPLANT
CLOTH BEACON ORANGE TIMEOUT ST (SAFETY) ×2 IMPLANT
CORDS BIPOLAR (ELECTRODE) ×2 IMPLANT
COVER MAYO STAND STRL (DRAPES) ×2 IMPLANT
COVER TABLE BACK 60X90 (DRAPES) ×2 IMPLANT
CUFF TOURNIQUET SINGLE 18IN (TOURNIQUET CUFF) ×2 IMPLANT
DRAPE EXTREMITY T 121X128X90 (DRAPE) ×2 IMPLANT
DRAPE SURG 17X23 STRL (DRAPES) ×2 IMPLANT
DRSG PAD ABDOMINAL 8X10 ST (GAUZE/BANDAGES/DRESSINGS) ×2 IMPLANT
GAUZE XEROFORM 1X8 LF (GAUZE/BANDAGES/DRESSINGS) ×2 IMPLANT
GLOVE BIO SURGEON STRL SZ 6.5 (GLOVE) ×1 IMPLANT
GLOVE BIO SURGEON STRL SZ7.5 (GLOVE) ×2 IMPLANT
GLOVE SURG ORTHO 8.0 STRL STRW (GLOVE) ×1 IMPLANT
GOWN PREVENTION PLUS XLARGE (GOWN DISPOSABLE) ×2 IMPLANT
GOWN STRL REIN XL XLG (GOWN DISPOSABLE) ×2 IMPLANT
NDL HYPO 25X1 1.5 SAFETY (NEEDLE) IMPLANT
NEEDLE HYPO 25X1 1.5 SAFETY (NEEDLE) IMPLANT
NS IRRIG 1000ML POUR BTL (IV SOLUTION) ×2 IMPLANT
PACK BASIN DAY SURGERY FS (CUSTOM PROCEDURE TRAY) ×2 IMPLANT
PADDING CAST ABS 4INX4YD NS (CAST SUPPLIES) ×1
PADDING CAST ABS COTTON 4X4 ST (CAST SUPPLIES) ×1 IMPLANT
SPONGE GAUZE 4X4 12PLY (GAUZE/BANDAGES/DRESSINGS) ×2 IMPLANT
STOCKINETTE 4X48 STRL (DRAPES) ×2 IMPLANT
SUT ETHILON 4 0 PS 2 18 (SUTURE) ×2 IMPLANT
SYR BULB 3OZ (MISCELLANEOUS) ×2 IMPLANT
SYR CONTROL 10ML LL (SYRINGE) ×1 IMPLANT
TOWEL OR 17X24 6PK STRL BLUE (TOWEL DISPOSABLE) ×4 IMPLANT
UNDERPAD 30X30 INCONTINENT (UNDERPADS AND DIAPERS) ×2 IMPLANT
WATER STERILE IRR 1000ML POUR (IV SOLUTION) ×2 IMPLANT

## 2011-08-23 NOTE — Op Note (Signed)
NAMEONELL, MCMATH               ACCOUNT NO.:  [id number]  MEDICAL RECORD NO.:  [id number]  LOCATION:                                 FACILITY:  PHYSICIAN:  Betha Loa, MD             DATE OF BIRTH:  DATE OF PROCEDURE:  08/23/2011 DATE OF DISCHARGE:                              OPERATIVE REPORT   PREOPERATIVE DIAGNOSIS:  Right carpal tunnel syndrome.  POSTOP DIAGNOSIS:  Right carpal tunnel syndrome.  PROCEDURE:  Right carpal tunnel release.  SURGEONS:  Betha Loa, MD  ASSISTANT:  Cindee Salt, M.D.  ANESTHESIA:  General.  IV FLUIDS:  Per anesthesia flow sheet.  ESTIMATED BLOOD LOSS:  Minimal.  COMPLICATIONS:  None.  SPECIMENS:  None.  TOURNIQUET TIME:  13 minutes.  DISPOSITION:  Stable to PACU.  INDICATIONS:  Ms. Million is a 31 year old right-hand-dominant female who has been experiencing tingling in the thumb, index, long, and ring fingers.  She has some pain in the wrist and forearm as well.  She had nerve conduction studies that were positive for carpal tunnel syndrome. We discussed operative and nonoperative treatment measures.  She wished to have release of transverse carpal ligament for treatment her symptoms.  Risks, benefits, alternatives of the surgery were discussed including the risk of blood loss, infection, damage to nerves, vessels, tendons, ligaments, bone, failure of surgery, need for additional surgery, complications, wound healing, continued pain, and continued carpal tunnel syndrome.  She voiced understanding of these risks and elected to proceed.  OPERATIVE COURSE:  After being identified preoperatively by myself, the Patient and I agreed upon procedure and site procedure.  Surgical site was marked.  The risks, benefits, and alternatives of surgery were reviewed and she wished to proceed.  Surgical consent had been signed.  She was given 1 g of IV Ancef as preoperative antibiotic prophylaxis.  She was transferred to the operating room and  placed on the operating table in supine position with right upper extremity on the arm board.  General anesthesia was induced by anesthesiologist.  The right upper extremity was prepped and draped in normal sterile orthopedic fashion.  Surgical pause was performed between surgeons, anesthesia, operating staff, and all were in agreement with the patient, procedure, and site of the procedure.  Tourniquet proximal aspect of the extremity was inflated to 250 mmHg after exsanguination of the limb with an Esmarch bandage. Incision was made centered over the transverse carpal ligament. Spreading technique was used in the subcutaneous tissues.  Bipolar electrocautery was used to obtain hemostasis.  The palmar fascia was incised using a scalpel.  The transverse carpal ligament was identified and incised in its distal extent.  Care was taken to ensure complete decompression of median nerve distally.  The ligament was then divided proximally.  The scissors were used to split the distal aspect of the volar antebrachial fascia.  Finger was placed in the wound to ensure complete decompression of the median nerve which was the case.  The nerve was inspected.  The motor branch was identified and was intact. There was persistent median artery.  The wound was copiously irrigated with sterile saline.  It was  closed with 4-0 nylon in a horizontal mattress fashion.  It was injected with 10 mL of 0.25% plain Marcaine to aid in postoperative analgesia.  Wound was then dressed with sterile, Xeroform, 4x4s, ABD, and wrapped with Kerlix and Ace bandage. Tourniquet was deflated at 13 minutes.  Fingertips were pink with brisk cap capillary refill after deflation of the tourniquet.  Operative drapes were broken down.  The patient was awoken from anesthesia safely. She was transferred back to stretcher and taken to PACU in stable condition.  I will see her back in the office in 1 week for postoperative followup.  I  gave her Percocet 5/325 one to two p.o. q.6 h p.r.n. pain, dispensed #30.     Betha Loa, MD     KK/MEDQ  D:  08/23/2011  T:  08/23/2011  Job:  [id number]

## 2011-08-23 NOTE — H&P (Signed)
Kara [name] is an 31 y.o. female.   Chief Complaint: carpal tunnel syndrome HPI: 31 yo rhd female with complaint of tingling in thumb, index, long, ring fingers.  Nerve conduction studies positive.  Wants surgical release.  Past Medical History  Diagnosis Date  . Depression   . Anxiety   . Anemia     no current med.  . Diverticular disease of colon   . 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 Vaginal cyst 08/2011    current antibiotic, will finish 08/31/2011  . Hypertension     under control, has been on med. > 4 yrs.    Past Surgical History  Procedure Date  . Cesarean section 2004, 01/30/2006  . Appendectomy   . Tubal ligation 07/28/2008    laparoscopic    History reviewed. No pertinent family history. Social History:  reports that she has quit smoking. She has never used smokeless tobacco. She reports that she does not drink alcohol or use illicit drugs.  Allergies:  Allergies  Allergen Reactions  . Citrus Swelling    SWELLING OF TONGUE; BUMPS ON TONGUE  . Pepperoni (Pickled Meat) Swelling    SWELLING OF TONGUE; BUMPS ON TONGUE  . Pineapple Swelling    SWELLING OF TONGUE; BUMPS ON TONGUE  . Banana Diarrhea  . Betadine (Povidone Iodine) Hives  . Codeine Nausea And Vomiting    ONLY THE PO LIQUID FORM  . Sulfa Antibiotics Rash    Medications Prior to Admission  Medication Sig Dispense Refill  . desvenlafaxine (PRISTIQ) 100 MG 24 hr tablet Take 100 mg by mouth daily. AM      . doxycycline (ADOXA) 100 MG tablet Take 100 mg by mouth daily.      . fluconazole (DIFLUCAN) 100 MG tablet Take 100 mg by mouth once a week.      . hydrOXYzine (ATARAX/VISTARIL) 25 MG tablet Take 25 mg by mouth 2 (two) times daily.      Marland Kitchen lisinopril-hydrochlorothiazide (PRINZIDE,ZESTORETIC) 10-12.5 MG per tablet Take 1 tablet by mouth daily. AM        Results for orders placed during the hospital encounter of 08/23/11 (from the past 48 hour(s))  BASIC METABOLIC PANEL     Status:  Abnormal   Collection Time   08/22/11  3:30 PM      Component Value Range Comment   Sodium 138  135 - 145 (mEq/L)    Potassium 4.3  3.5 - 5.1 (mEq/L)    Chloride 103  96 - 112 (mEq/L)    CO2 26  19 - 32 (mEq/L)    Glucose, Bld 140 (*) 70 - 99 (mg/dL)    BUN 11  6 - 23 (mg/dL)    Creatinine, Ser 8.29  0.50 - 1.10 (mg/dL)    Calcium 9.1  8.4 - 10.5 (mg/dL)    GFR calc non Af Amer >90  >90 (mL/min)    GFR calc Af Amer >90  >90 (mL/min)     No results found.   A comprehensive review of systems was negative except for: Behavioral/Psych: positive for depression  Blood pressure 137/84, pulse 73, temperature 98 F (36.7 C), temperature source Oral, resp. rate 18, height 5\' 2"  (1.575 m), weight 116.121 kg (256 lb), last menstrual period 08/18/2011, SpO2 100.00%, unknown if currently breastfeeding.  General appearance: alert, cooperative and appears stated age Head: Normocephalic, without obvious abnormality, atraumatic Neck: supple, symmetrical, trachea midline Resp: clear to auscultation bilaterally Cardio: regular rate and rhythm GI: soft,  non-tender; bowel sounds normal; no masses,  no organomegaly Extremities: sensation and capillary refill intact all digits.  +epl/fpl/io.  skin intact.   Pulses: 2+ and symmetric Skin: Skin color, texture, turgor normal. No rashes or lesions Neurologic: Grossly normal Incision/Wound: na  Assessment/Plan Right carpal tunnel syndrome.  Discussed operative and non operative treatment measures.  Patient elects operative treatment.  Risks, benefits, and alternatives of surgery were discussed and the patient agrees with the plan of care.  Taron Mondor R 08/23/2011, 10:08 AM

## 2011-08-23 NOTE — Anesthesia Procedure Notes (Signed)
Procedure Name: LMA Insertion Date/Time: 08/23/2011 11:33 AM Performed by: Caren Macadam Pre-anesthesia Checklist: Patient identified, Emergency Drugs available, Suction available and Patient being monitored Patient Re-evaluated:Patient Re-evaluated prior to inductionOxygen Delivery Method: Circle system utilized Preoxygenation: Pre-oxygenation with 100% oxygen Intubation Type: IV induction Ventilation: Mask ventilation without difficulty LMA: LMA with gastric port inserted LMA Size: 4.0 Tube type: Oral Number of attempts: 1 Dental Injury: Teeth and Oropharynx as per pre-operative assessment

## 2011-08-23 NOTE — Anesthesia Postprocedure Evaluation (Signed)
  Anesthesia Post-op Note  Patient: Kara [name]  Procedure(s) Performed: Procedure(s) (LRB): CARPAL TUNNEL RELEASE (Right)  Patient Location: PACU  Anesthesia Type: General  Level of Consciousness: awake, alert  and oriented  Airway and Oxygen Therapy: Patient Spontanous Breathing  Post-op Pain: none  Post-op Assessment: Post-op Vital signs reviewed, Patient's Cardiovascular Status Stable, Respiratory Function Stable, Patent Airway, No signs of Nausea or vomiting and Pain level controlled  Post-op Vital Signs: Reviewed and stable  Complications: No apparent anesthesia complications

## 2011-08-23 NOTE — Anesthesia Preprocedure Evaluation (Signed)
Anesthesia Evaluation  Patient identified by MRN, date of birth, ID band Patient awake    Reviewed: Allergy & Precautions, H&P , NPO status , Patient's Chart, lab work & pertinent test results  History of Anesthesia Complications Negative for: history of anesthetic complications  Airway Mallampati: I TM Distance: >3 FB Neck ROM: Full    Dental No notable dental hx. (+) Teeth Intact and Dental Advisory Given   Pulmonary neg pulmonary ROS,  breath sounds clear to auscultation  Pulmonary exam normal       Cardiovascular hypertension, Pt. on medications Rhythm:Regular Rate:Normal     Neuro/Psych negative neurological ROS     GI/Hepatic Neg liver ROS, GERD-  Medicated and Controlled,  Endo/Other  Morbid obesity  Renal/GU negative Renal ROS     Musculoskeletal   Abdominal (+) + obese,   Peds  Hematology negative hematology ROS (+)   Anesthesia Other Findings   Reproductive/Obstetrics                           Anesthesia Physical Anesthesia Plan  ASA: III  Anesthesia Plan: General   Post-op Pain Management:    Induction: Intravenous  Airway Management Planned: LMA  Additional Equipment:   Intra-op Plan:   Post-operative Plan:   Informed Consent: I have reviewed the patients History and Physical, chart, labs and discussed the procedure including the risks, benefits and alternatives for the proposed anesthesia with the patient or authorized representative who has indicated his/her understanding and acceptance.   Dental advisory given  Plan Discussed with: CRNA and Surgeon  Anesthesia Plan Comments: (Plan routine monitors, GA- LMA OK)        Anesthesia Quick Evaluation

## 2011-08-23 NOTE — Discharge Instructions (Addendum)

## 2011-08-23 NOTE — Transfer of Care (Signed)
Immediate Anesthesia Transfer of Care Note  Patient: Kara [name]  Procedure(s) Performed: Procedure(s) (LRB): CARPAL TUNNEL RELEASE (Right)  Patient Location: PACU  Anesthesia Type: General  Level of Consciousness: sedated  Airway & Oxygen Therapy: Patient Spontanous Breathing and Patient connected to face mask oxygen  Post-op Assessment: Report given to PACU RN and Post -op Vital signs reviewed and stable  Post vital signs: Reviewed and stable  Complications: No apparent anesthesia complications

## 2011-08-23 NOTE — Op Note (Signed)
Dictation [id number]

## 2011-08-24 ENCOUNTER — Encounter (HOSPITAL_BASED_OUTPATIENT_CLINIC_OR_DEPARTMENT_OTHER): Payer: Self-pay | Admitting: Orthopedic Surgery

## 2011-09-05 ENCOUNTER — Inpatient Hospital Stay (HOSPITAL_COMMUNITY)
Admission: EM | Admit: 2011-09-05 | Discharge: 2011-09-10 | DRG: 392 | Disposition: A | Discharge status: Home / Self Care | Payer: Medicaid Other | Attending: Internal Medicine | Admitting: Internal Medicine

## 2011-09-05 ENCOUNTER — Encounter (HOSPITAL_COMMUNITY): Payer: Self-pay | Admitting: Emergency Medicine

## 2011-09-05 DIAGNOSIS — F3289 Other specified depressive episodes: Secondary | ICD-10-CM | POA: Diagnosis present

## 2011-09-05 DIAGNOSIS — F329 Major depressive disorder, single episode, unspecified: Secondary | ICD-10-CM

## 2011-09-05 DIAGNOSIS — K5900 Constipation, unspecified: Secondary | ICD-10-CM | POA: Diagnosis present

## 2011-09-05 DIAGNOSIS — I10 Essential (primary) hypertension: Secondary | ICD-10-CM | POA: Diagnosis present

## 2011-09-05 DIAGNOSIS — K5732 Diverticulitis of large intestine without perforation or abscess without bleeding: Secondary | ICD-10-CM

## 2011-09-05 DIAGNOSIS — F411 Generalized anxiety disorder: Secondary | ICD-10-CM | POA: Diagnosis present

## 2011-09-05 DIAGNOSIS — F32A Depression, unspecified: Secondary | ICD-10-CM | POA: Diagnosis present

## 2011-09-05 DIAGNOSIS — D509 Iron deficiency anemia, unspecified: Secondary | ICD-10-CM | POA: Diagnosis present

## 2011-09-05 DIAGNOSIS — Z6841 Body Mass Index (BMI) 40.0 and over, adult: Secondary | ICD-10-CM

## 2011-09-05 DIAGNOSIS — E119 Type 2 diabetes mellitus without complications: Secondary | ICD-10-CM | POA: Diagnosis present

## 2011-09-05 DIAGNOSIS — Z79899 Other long term (current) drug therapy: Secondary | ICD-10-CM

## 2011-09-05 DIAGNOSIS — E669 Obesity, unspecified: Secondary | ICD-10-CM | POA: Diagnosis present

## 2011-09-05 DIAGNOSIS — K5792 Diverticulitis of intestine, part unspecified, without perforation or abscess without bleeding: Secondary | ICD-10-CM

## 2011-09-05 DIAGNOSIS — Z87891 Personal history of nicotine dependence: Secondary | ICD-10-CM

## 2011-09-05 DIAGNOSIS — D649 Anemia, unspecified: Secondary | ICD-10-CM | POA: Diagnosis present

## 2011-09-05 LAB — DIFFERENTIAL
Basophils Absolute: 0 10*3/uL (ref 0.0–0.1)
Basophils Relative: 0 % (ref 0–1)
Eosinophils Relative: 2 % (ref 0–5)
Monocytes Absolute: 0.6 10*3/uL (ref 0.1–1.0)
Monocytes Relative: 6 % (ref 3–12)
Neutro Abs: 7 10*3/uL (ref 1.7–7.7)

## 2011-09-05 LAB — CREATININE, SERUM: Creatinine, Ser: 0.67 mg/dL (ref 0.50–1.10)

## 2011-09-05 LAB — LIPASE, BLOOD: Lipase: 23 U/L (ref 11–59)

## 2011-09-05 LAB — URINALYSIS, ROUTINE W REFLEX MICROSCOPIC
Nitrite: NEGATIVE
Specific Gravity, Urine: 1.016 (ref 1.005–1.030)
pH: 7.5 (ref 5.0–8.0)

## 2011-09-05 LAB — COMPREHENSIVE METABOLIC PANEL
Albumin: 3.4 g/dL — ABNORMAL LOW (ref 3.5–5.2)
BUN: 6 mg/dL (ref 6–23)
CO2: 25 mEq/L (ref 19–32)
Calcium: 9 mg/dL (ref 8.4–10.5)
Chloride: 98 mEq/L (ref 96–112)
Creatinine, Ser: 0.61 mg/dL (ref 0.50–1.10)
GFR calc non Af Amer: >90 mL/min (ref >90)
Total Bilirubin: 0.4 mg/dL (ref 0.3–1.2)

## 2011-09-05 LAB — CBC
HCT: 31.6 % — ABNORMAL LOW (ref 36.0–46.0)
Hemoglobin: 10.4 g/dL — ABNORMAL LOW (ref 12.0–15.0)
Hemoglobin: 9.9 g/dL — ABNORMAL LOW (ref 12.0–15.0)
MCH: 25.9 pg — ABNORMAL LOW (ref 26.0–34.0)
MCHC: 32.9 g/dL (ref 30.0–36.0)
MCHC: 32.7 g/dL (ref 30.0–36.0)
MCV: 79 fL (ref 78.0–100.0)
Platelets: 345 10*3/uL (ref 150–400)
RDW: 16.7 % — ABNORMAL HIGH (ref 11.5–15.5)
RDW: 16.8 % — ABNORMAL HIGH (ref 11.5–15.5)

## 2011-09-05 LAB — URINE MICROSCOPIC-ADD ON

## 2011-09-05 MED ORDER — VENLAFAXINE HCL ER 150 MG PO CP24
150.0000 mg | ORAL_CAPSULE | Freq: Every day | ORAL | Status: DC
  Administered 2011-09-06 – 2011-09-10 (×5): 150 mg via ORAL
  Filled 2011-09-05 (×6): qty 1

## 2011-09-05 MED ORDER — ONDANSETRON HCL 4 MG/2ML IJ SOLN
4.0000 mg | Freq: Four times a day (QID) | INTRAMUSCULAR | Status: DC | PRN
  Administered 2011-09-05 – 2011-09-06 (×3): 4 mg via INTRAVENOUS
  Filled 2011-09-05 (×4): qty 2

## 2011-09-05 MED ORDER — CIPROFLOXACIN IN D5W 400 MG/200ML IV SOLN
400.0000 mg | Freq: Once | INTRAVENOUS | Status: AC
  Administered 2011-09-05: 400 mg via INTRAVENOUS
  Filled 2011-09-05: qty 200

## 2011-09-05 MED ORDER — HYDRALAZINE HCL 20 MG/ML IJ SOLN
10.0000 mg | Freq: Four times a day (QID) | INTRAMUSCULAR | Status: DC | PRN
  Filled 2011-09-05: qty 0.5

## 2011-09-05 MED ORDER — SODIUM CHLORIDE 0.9 % IV SOLN
Freq: Once | INTRAVENOUS | Status: AC
  Administered 2011-09-05: 12:00:00 via INTRAVENOUS

## 2011-09-05 MED ORDER — METRONIDAZOLE IN NACL 5-0.79 MG/ML-% IV SOLN
500.0000 mg | Freq: Three times a day (TID) | INTRAVENOUS | Status: DC
  Administered 2011-09-05 – 2011-09-10 (×15): 500 mg via INTRAVENOUS
  Filled 2011-09-05 (×18): qty 100

## 2011-09-05 MED ORDER — POLYETHYLENE GLYCOL 3350 17 G PO PACK
17.0000 g | PACK | Freq: Two times a day (BID) | ORAL | Status: DC
  Administered 2011-09-05 – 2011-09-10 (×2): 17 g via ORAL
  Filled 2011-09-05 (×11): qty 1

## 2011-09-05 MED ORDER — METRONIDAZOLE IN NACL 5-0.79 MG/ML-% IV SOLN
500.0000 mg | Freq: Once | INTRAVENOUS | Status: AC
  Administered 2011-09-05: 500 mg via INTRAVENOUS
  Filled 2011-09-05: qty 100

## 2011-09-05 MED ORDER — CIPROFLOXACIN IN D5W 400 MG/200ML IV SOLN
400.0000 mg | Freq: Two times a day (BID) | INTRAVENOUS | Status: DC
  Administered 2011-09-05 – 2011-09-10 (×10): 400 mg via INTRAVENOUS
  Filled 2011-09-05 (×11): qty 200

## 2011-09-05 MED ORDER — HEPARIN SODIUM (PORCINE) 5000 UNIT/ML IJ SOLN
5000.0000 [IU] | Freq: Three times a day (TID) | INTRAMUSCULAR | Status: DC
  Administered 2011-09-05 – 2011-09-10 (×16): 5000 [IU] via SUBCUTANEOUS
  Filled 2011-09-05 (×18): qty 1

## 2011-09-05 MED ORDER — ONDANSETRON HCL 4 MG/2ML IJ SOLN
4.0000 mg | Freq: Once | INTRAMUSCULAR | Status: AC
  Administered 2011-09-05: 4 mg via INTRAVENOUS
  Filled 2011-09-05: qty 2

## 2011-09-05 MED ORDER — SODIUM CHLORIDE 0.9 % IV SOLN
INTRAVENOUS | Status: DC
  Administered 2011-09-06 – 2011-09-07 (×2): via INTRAVENOUS
  Administered 2011-09-07: 100 mL/h via INTRAVENOUS
  Administered 2011-09-08: 23:00:00 via INTRAVENOUS
  Administered 2011-09-08: 100 mL/h via INTRAVENOUS
  Administered 2011-09-09: 11:00:00 via INTRAVENOUS

## 2011-09-05 MED ORDER — ONDANSETRON HCL 4 MG PO TABS: 4.0000 mg | ORAL_TABLET | Freq: Four times a day (QID) | ORAL | Status: DC | PRN

## 2011-09-05 MED ORDER — HYDROMORPHONE HCL PF 1 MG/ML IJ SOLN
1.0000 mg | INTRAMUSCULAR | Status: DC | PRN
  Administered 2011-09-05 – 2011-09-10 (×28): 1 mg via INTRAVENOUS
  Filled 2011-09-05 (×29): qty 1

## 2011-09-05 MED ORDER — HYDROMORPHONE HCL PF 1 MG/ML IJ SOLN
1.0000 mg | Freq: Once | INTRAMUSCULAR | Status: AC
  Administered 2011-09-05: 1 mg via INTRAVENOUS
  Filled 2011-09-05: qty 1

## 2011-09-05 NOTE — ED Notes (Signed)
Onset 2 days ago abdominal pain with nausea and vomiting multiple episodes.  Pain 8/10 sharp LLQ radiating to middle lower abdomen. Ax4

## 2011-09-05 NOTE — ED Notes (Signed)
Patient states onset 2 days ago continued today worsening overtime LLQ pain radiating to middle lower abdomen.  States nausea and emesis. Currently patient states pain 8/10 sharp pain resting comfortably on stretcher.  Abdomen soft distended.  Airway intact bilateral equal chest rise and fall.

## 2011-09-05 NOTE — Consult Note (Signed)
[facility] Consultation Note  Referring Provider: Dr. Baltazar Najjar Primary Care Physician:  Dorrene German, MD, MD Primary Gastroenterologist:  Dr. Dorena Cookey   Reason for Consultation:  Abdominal pain, history diverticulitis  HPI: Kara [name] is a 31 y.o. 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  For the past couple days, she has had progressive left lower quadrant abdominal pain with nausea and some vomiting. No diarrhea; has recently been constipated.  No fevers.  No blood in stool.  Has been constipated of late.  Symptoms similar to CT-confirmed sigmoid diverticulitis which she had one year ago.  On recent course of antibiotics for vaginal cyst.   Past Medical History  Diagnosis Date  . Depression   . Anxiety   . Anemia     no current med.  . Diverticular disease of colon   . 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 Vaginal cyst 08/2011    current antibiotic, will finish 08/31/2011  . Hypertension     under control, has been on med. > 4 yrs.    Past Surgical History  Procedure Date  . Cesarean section 2004, 01/30/2006  . Appendectomy   . Tubal ligation 07/28/2008    laparoscopic  . Carpal tunnel release 08/23/2011    Procedure: CARPAL TUNNEL RELEASE;  Surgeon: Tami Ribas, MD;  Location: [facility];  Service: Orthopedics;  Laterality: Right;    Prior to Admission medications   Medication Sig Start Date End Date Taking? Authorizing Provider  desvenlafaxine (PRISTIQ) 100 MG 24 hr tablet Take 100 mg by mouth every morning.    Yes Historical Provider, MD  hydrOXYzine (ATARAX/VISTARIL) 25 MG tablet Take 25 mg by mouth 2 (two) times daily.   Yes Historical Provider, MD  lisinopril-hydrochlorothiazide (PRINZIDE,ZESTORETIC) 10-12.5 MG per tablet Take 1 tablet by mouth every morning.    Yes Historical Provider, MD    Current Facility-Administered Medications  Medication Dose Route Frequency  Provider Last Rate Last Dose  . 0.9 %  sodium chloride infusion   Intravenous Once Dione Booze, MD 150 mL/hr at 09/05/11 1159    . 0.9 %  sodium chloride infusion   Intravenous Continuous Antonieta Pert, MD      . ciprofloxacin (CIPRO) IVPB 400 mg  400 mg Intravenous Once Dione Booze, MD   400 mg at 09/05/11 1347  . ciprofloxacin (CIPRO) IVPB 400 mg  400 mg Intravenous Q12H Antonieta Pert, MD      . heparin injection 5,000 Units  5,000 Units Subcutaneous Q8H Antonieta Pert, MD      . hydrALAZINE (APRESOLINE) injection 10 mg  10 mg Intravenous Q6H PRN Antonieta Pert, MD      . HYDROmorphone (DILAUDID) injection 1 mg  1 mg Intravenous Once Dione Booze, MD   1 mg at 09/05/11 1200  . HYDROmorphone (DILAUDID) injection 1 mg  1 mg Intravenous Q4H PRN Antonieta Pert, MD      . metroNIDAZOLE (FLAGYL) IVPB 500 mg  500 mg Intravenous Once Dione Booze, MD   500 mg at 09/05/11 1202  . metroNIDAZOLE (FLAGYL) IVPB 500 mg  500 mg Intravenous Q8H Antonieta Pert, MD      . ondansetron [facility]) injection 4 mg  4 mg Intravenous Once Dione Booze, MD   4 mg at 09/05/11 1105  . ondansetron (ZOFRAN) tablet 4 mg  4 mg Oral Q6H PRN Antonieta Pert, MD  Or  . ondansetron (ZOFRAN) injection 4 mg  4 mg Intravenous Q6H PRN Antonieta Pert, MD      . venlafaxine XR (EFFEXOR-XR) 24 hr capsule 150 mg  150 mg Oral Q breakfast Antonieta Pert, MD        Allergies as of 09/05/2011 - Review Complete 09/05/2011  Allergen Reaction Noted  . Citrus Swelling 08/22/2011  . Pepperoni (pickled meat) Swelling 08/22/2011  . Pineapple Swelling 08/22/2011  . Banana Diarrhea 12/10/2010  . Betadine (povidone iodine) Hives 09/16/2010  . Codeine Nausea And Vomiting 09/16/2010  . Sulfa antibiotics Rash 09/16/2010    No family history on file.  History   Social History  . Marital Status: Widowed    Spouse Name: N/A    Number of Children: N/A  . Years of Education: N/A   Occupational History  . Not on file.    Social History Main Topics  . Smoking status: Former Games developer  . Smokeless tobacco: Never Used   Comment: can't remember when she quit smoking  . Alcohol Use: No  . Drug Use: No  . Sexually Active:    Other Topics Concern  . Not on file   Social History Narrative  . No narrative on file    Review of Systems: Positive = bold Gen: Denies any fever, chills, rigors, night sweats, anorexia, fatigue, weakness, malaise, involuntary weight loss, and sleep disorder CV: Denies chest pain, angina, palpitations, syncope, orthopnea, PND, peripheral edema, and claudication. Resp: Denies dyspnea, cough, sputum, wheezing, coughing up blood. GI: Described in detail in HPI.    GU : Denies urinary burning, blood in urine, urinary frequency, urinary hesitancy, nocturnal urination, and urinary incontinence. MS: Denies joint pain or swelling.  Denies muscle weakness, cramps, atrophy.  Derm: Denies rash, itching, oral ulcerations, hives, unhealing ulcers.  Psych: Denies depression, anxiety, memory loss, suicidal ideation, hallucinations,  and confusion. Heme: Denies bruising, bleeding, and enlarged lymph nodes. Neuro:  Denies any headaches, dizziness, paresthesias. Endo:  Denies any problems with DM, thyroid, adrenal function.  Physical Exam: Vital signs in last 24 hours: Temp:  [98.1 F (36.7 C)-98.8 F (37.1 C)] 98.1 F (36.7 C) (05/20 1618) Pulse Rate:  [74-87] 87  (05/20 1618) Resp:  [16-18] 18  (05/20 1618) BP: (124-137)/(66-74) 133/74 mmHg (05/20 1618) SpO2:  [96 %-100 %] 100 % (05/20 1618)   General:   Alert,  Overweight, Well-developed, well-nourished, pleasant and cooperative; non-toxic appearing Head:  Normocephalic and atraumatic. Eyes:  Sclera clear, no icterus.   Conjunctiva pink. Ears:  Normal auditory acuity. Nose:  No deformity, discharge,  or lesions. Mouth:  No deformity or lesions.  Oropharynx pink & moist. Neck:  Supple; no masses or thyromegaly. Lungs:  Clear throughout  to auscultation.   No wheezes, crackles, or rhonchi. No acute distress. Heart:  Regular rate and rhythm; no murmurs, clicks, rubs,  or gallops. Abdomen:  Soft, nondistended. Very mild left lower quadrant tenderness.  No masses, hepatosplenomegaly or hernias noted. Normal bowel sounds, without guarding, and without rebound.     Msk:  Symmetrical without gross deformities. Normal posture. Pulses:  Normal pulses noted. Extremities:  Without clubbing or edema. Neurologic:  Alert and  oriented x4;  grossly normal neurologically. Skin:  Intact without significant lesions or rashes. Psych:  Alert and cooperative. Normal mood and affect.   Lab Results:  [facility] 09/05/11 1057  WBC 9.8  HGB 10.4*  HCT 31.6*  PLT 402*   BMET  Basename 09/05/11 1057  NA 134*  K 3.7  CL 98  CO2 25  GLUCOSE 126*  BUN 6  CREATININE 0.61  CALCIUM 9.0   LFT  Basename 09/05/11 1057  PROT 7.4  ALBUMIN 3.4*  AST 15  ALT 11  ALKPHOS 44  BILITOT 0.4  BILIDIR --  IBILI --    Impression:  1.  LLQ pain.  No fevers or leukocytosis.  Abdominal exam benign.   Differential diagnosis to include constipation, irritable bowel syndrome, mild diverticulitis.  She is non-toxic appearing. 2.  Constipation.  Plan:  1.  Gentle bowel regimen (Miralax 17 grams bid). 2.  Agree with IV antibiotics. 3.  If pain persists on IV antibiotics, and once she starts having bowel movements, could consider MRI abd/pelvis with contrast (radiation-sparing imaging modality) to assess for diverticulitis or complications (abscess, etc.) therein. 4.  Will follow.  Thank you for the consult.   LOS: 0 days   Reinhart Saulters M  09/05/2011, 4:20 PM

## 2011-09-05 NOTE — ED Provider Notes (Signed)
History     CSN: [id number]  Arrival date & time 09/05/11  1017   First MD Initiated Contact with Patient 09/05/11 1118      Chief Complaint  Patient presents with  . Abdominal Pain    (Consider location/radiation/quality/duration/timing/severity/associated sxs/prior treatment) Patient is a 31 y.o. female presenting with abdominal pain. The history is provided by the patient.  Abdominal Pain The primary symptoms of the illness include abdominal pain.  She has a history of diverticulitis and started having left lower quadrant pain 2 days ago which is getting worse. Pain is crampy in nature. It is 7/10 currently but has been as severe as 10/10. There is associated nausea and vomiting and constipation. She denies fever, chills, sweats. She tried taking a dose of Percocet which did not help with pain. Pain is similar to what she had with diverticulitis previously. It is worse with palpation but nothing makes it better.  Past Medical History  Diagnosis Date  . Depression   . Anxiety   . Anemia     no current med.  . Diverticular disease of colon   . 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 Vaginal cyst 08/2011    current antibiotic, will finish 08/31/2011  . Hypertension     under control, has been on med. > 4 yrs.    Past Surgical History  Procedure Date  . Cesarean section 2004, 01/30/2006  . Appendectomy   . Tubal ligation 07/28/2008    laparoscopic  . Carpal tunnel release 08/23/2011    Procedure: CARPAL TUNNEL RELEASE;  Surgeon: Tami Ribas, MD;  Location: [facility];  Service: Orthopedics;  Laterality: Right;    No family history on file.  History  Substance Use Topics  . Smoking status: Former Games developer  . Smokeless tobacco: Never Used   Comment: can't remember when she quit smoking  . Alcohol Use: No    OB History    Grav Para Term Preterm Abortions TAB SAB Ect Mult Living   6 2 2  3  3   2       Review of Systems  Gastrointestinal:  Positive for abdominal pain.  All other systems reviewed and are negative.    Allergies  Citrus; Pepperoni; Pineapple; Banana; Betadine; Codeine; and Sulfa antibiotics  Home Medications   Current Outpatient Rx  Name Route Sig Dispense Refill  . DESVENLAFAXINE SUCCINATE ER 100 MG PO TB24 Oral Take 100 mg by mouth every morning.     Marland Kitchen HYDROXYZINE HCL 25 MG PO TABS Oral Take 25 mg by mouth 2 (two) times daily.    Marland Kitchen LISINOPRIL-HYDROCHLOROTHIAZIDE 10-12.5 MG PO TABS Oral Take 1 tablet by mouth every morning.       BP 137/73  Pulse 74  Temp(Src) 98.8 F (37.1 C) (Oral)  Resp 16  LMP 08/18/2011  Breastfeeding? Unknown  Physical Exam  Nursing note and vitals reviewed.  31 year old female is resting comfortably and in no acute distress. Vital signs are normal. Head is normocephalic and atraumatic. PERRLA, EOMI. There is no scleral icterus. Mucous membranes are moist. Neck is nontender and supple. Back is nontender. There's no CVA tenderness. Lungs are clear without rales, wheezes, or rhonchi. Heart has regular rate and rhythm without murmur. Abdomen is soft, flat, with moderate left lower quadrant tenderness without rebound or guarding. Peristalsis is decreased. Extremities have full range of motion, no cyanosis or edema. Skin is warm and dry without rash. Neurologic: Mental status is normal, cranial nerves  are intact, there are no focal motor or sensory deficits.  ED Course  Procedures (including critical care time)  Results for orders placed during the hospital encounter of 09/05/11  CBC      Component Value Range   WBC 9.8  4.0 - 10.5 (K/uL)   RBC 4.00  3.87 - 5.11 (MIL/uL)   Hemoglobin 10.4 (*) 12.0 - 15.0 (g/dL)   HCT 40.9 (*) 81.1 - 46.0 (%)   MCV 79.0  78.0 - 100.0 (fL)   MCH 26.0  26.0 - 34.0 (pg)   MCHC 32.9  30.0 - 36.0 (g/dL)   RDW 91.4 (*) 78.2 - 15.5 (%)   Platelets 402 (*) 150 - 400 (K/uL)  DIFFERENTIAL      Component Value Range   Neutrophils Relative 72  43 - 77  (%)   Neutro Abs 7.0  1.7 - 7.7 (K/uL)   Lymphocytes Relative 20  12 - 46 (%)   Lymphs Abs 1.9  0.7 - 4.0 (K/uL)   Monocytes Relative 6  3 - 12 (%)   Monocytes Absolute 0.6  0.1 - 1.0 (K/uL)   Eosinophils Relative 2  0 - 5 (%)   Eosinophils Absolute 0.2  0.0 - 0.7 (K/uL)   Basophils Relative 0  0 - 1 (%)   Basophils Absolute 0.0  0.0 - 0.1 (K/uL)  COMPREHENSIVE METABOLIC PANEL      Component Value Range   Sodium 134 (*) 135 - 145 (mEq/L)   Potassium 3.7  3.5 - 5.1 (mEq/L)   Chloride 98  96 - 112 (mEq/L)   CO2 25  19 - 32 (mEq/L)   Glucose, Bld 126 (*) 70 - 99 (mg/dL)   BUN 6  6 - 23 (mg/dL)   Creatinine, Ser 9.56  0.50 - 1.10 (mg/dL)   Calcium 9.0  8.4 - 21.3 (mg/dL)   Total Protein 7.4  6.0 - 8.3 (g/dL)   Albumin 3.4 (*) 3.5 - 5.2 (g/dL)   AST 15  0 - 37 (U/L)   ALT 11  0 - 35 (U/L)   Alkaline Phosphatase 44  39 - 117 (U/L)   Total Bilirubin 0.4  0.3 - 1.2 (mg/dL)   GFR calc non Af Amer >90  >90 (mL/min)   GFR calc Af Amer >90  >90 (mL/min)  URINALYSIS, ROUTINE W REFLEX MICROSCOPIC      Component Value Range   Color, Urine YELLOW  YELLOW    APPearance CLEAR  CLEAR    Specific Gravity, Urine 1.016  1.005 - 1.030    pH 7.5  5.0 - 8.0    Glucose, UA NEGATIVE  NEGATIVE (mg/dL)   Hgb urine dipstick NEGATIVE  NEGATIVE    Bilirubin Urine NEGATIVE  NEGATIVE    Ketones, ur NEGATIVE  NEGATIVE (mg/dL)   Protein, ur NEGATIVE  NEGATIVE (mg/dL)   Urobilinogen, UA 0.2  0.0 - 1.0 (mg/dL)   Nitrite NEGATIVE  NEGATIVE    Leukocytes, UA TRACE (*) NEGATIVE   LIPASE, BLOOD      Component Value Range   Lipase 23  11 - 59 (U/L)  URINE MICROSCOPIC-ADD ON      Component Value Range   Squamous Epithelial / LPF RARE  RARE    WBC, UA 0-2  <3 (WBC/hpf)    1. Diverticulitis       MDM  Probable episode of diverticulitis. Old records are reviewed and she was admitted to the hospital one year ago for diverticulitis. Of note, she had 4 CT  scans related to that episode and I will try to avoid  further radiation exposure unnecessarily.   Laboratory workup is unremarkable. However, she will need to be admitted because of inability to control pain at home with Percocet. Initial dose was given of ciprofloxacin and metronidazole. Case is discussed with Dr. Cleotis Lema of [facility] who agrees to admit the patient.       Dione Booze, MD 09/05/11 1620

## 2011-09-05 NOTE — H&P (Signed)
PCP:  Dorrene German, MD, MD   DOA:  09/05/2011 10:26 AM  Chief Complaint:  Abdominal pain  HPI: 31 years old African American woman with history of diverticulitis, presented to the ER today with chief complain of left lower quadrant pain for 2 days. She describes her pain as a dull, was more than 10 out of 10 however currently at 7/10, associated with nausea or vomiting. She also felt hot and sweaty. Patient stated that it's similar to her previous diverticulitis flares, she took left over ciprofloxacin and Flagyl that she had at home however she was vomiting. Her condition worsened and she decided to come to the emergency room today. In the ED patient was afebrile, labs showed no leukocytosis. She was started on IV Flagyl and ciprofloxacin and we were consulted to admit for further management  Allergies: Allergies  Allergen Reactions  . Citrus Swelling    SWELLING OF TONGUE; BUMPS ON TONGUE  . Pepperoni (Pickled Meat) Swelling    SWELLING OF TONGUE; BUMPS ON TONGUE  . Pineapple Swelling    SWELLING OF TONGUE; BUMPS ON TONGUE  . Banana Diarrhea  . Betadine (Povidone Iodine) Hives  . Codeine Nausea And Vomiting    ONLY THE PO LIQUID FORM  . Sulfa Antibiotics Rash    Prior to Admission medications   Medication Sig Start Date End Date Taking? Authorizing Provider  desvenlafaxine (PRISTIQ) 100 MG 24 hr tablet Take 100 mg by mouth every morning.    Yes Historical Provider, MD  hydrOXYzine (ATARAX/VISTARIL) 25 MG tablet Take 25 mg by mouth 2 (two) times daily.   Yes Historical Provider, MD  lisinopril-hydrochlorothiazide (PRINZIDE,ZESTORETIC) 10-12.5 MG per tablet Take 1 tablet by mouth every morning.    Yes Historical Provider, MD    Past Medical History  Diagnosis Date  . Depression   . Anxiety   . Anemia     no current med.  . Diverticular disease of colon   . 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 Vaginal cyst 08/2011    current antibiotic, will finish 08/31/2011  .  Hypertension     under control, has been on med. > 4 yrs.    Past Surgical History  Procedure Date  . Cesarean section 2004, 01/30/2006  . Appendectomy   . Tubal ligation 07/28/2008    laparoscopic  . Carpal tunnel release 08/23/2011    Procedure: CARPAL TUNNEL RELEASE;  Surgeon: Tami Ribas, MD;  Location: [facility];  Service: Orthopedics;  Laterality: Right;    Social History:  Lives with family, reports that she has quit smoking.  She reports that she does not drink alcohol or use illicit drugs.  No family history on file.  Review of Systems: As above in history of present illness Constitutional: Denies fever, chills, diaphoresis, appetite change and fatigue.  HEENT: Denies photophobia, eye pain, redness, hearing loss, ear pain, congestion, sore throat, rhinorrhea, sneezing, mouth sores, trouble swallowing, neck pain, neck stiffness and tinnitus.   Respiratory: Denies SOB, DOE, cough, chest tightness,  and wheezing.   Cardiovascular: Denies chest pain, palpitations and leg swelling.   Genitourinary: Denies dysuria, urgency, frequency, hematuria, flank pain and difficulty urinating.  Musculoskeletal: Denies myalgias, back pain, joint swelling, arthralgias and gait problem.  Skin: Denies pallor, rash and wound.  Neurological: Denies dizziness, seizures, syncope, weakness, light-headedness, numbness and headaches.  Hematological: Denies adenopathy. Easy bruising, personal or family bleeding history  Psychiatric/Behavioral: Denies suicidal ideation, mood changes, confusion, nervousness, sleep disturbance and  agitation   Physical Exam:  Filed Vitals:   09/05/11 1040 09/05/11 1213  BP: 137/73 124/66  Pulse: 74 75  Temp: 98.8 F (37.1 C)   TempSrc: Oral   Resp: 16 16  SpO2:  96%    Constitutional: Vital signs reviewed.  Patient is a well-developed and well-nourished in no acute distress and cooperative with exam. Alert and oriented x3.  Eyes: PERRL, EOMI,  conjunctivae normal, No scleral icterus.  Neck: Supple, Trachea midline normal ROM, No JVD, mass, thyromegaly, or carotid bruit present.  Cardiovascular: RRR, S1 normal, S2 normal, no MRG, pulses symmetric and intact bilaterally Pulmonary/Chest: CTAB, no wheezes, rales, or rhonchi Abdominal: Soft. Left lower other tenderness with no rebound tenderness or guarding, non-distended, bowel sounds are normal, no masses, organomegaly present.  Musculoskeletal: No joint deformities, erythema, or stiffness, ROM full and no nontender Ext: no edema and no cyanosis, pulses palpable bilaterally (DP and PT) Hematology: no cervical, inginal, or axillary adenopathy.  Neurological: A&O x3, Strenght is normal and symmetric bilaterally, cranial nerve II-XII are grossly intact, no focal motor deficit, sensory intact to light touch bilaterally.  Skin: Warm, dry and intact. No rash, cyanosis, or clubbing.  Psychiatric: Normal mood and affect. speech and behavior is normal. Judgment and thought content normal. Cognition and memory are normal.   Labs on Admission:  Results for orders placed during the hospital encounter of 09/05/11 (from the past 48 hour(s))  CBC     Status: Abnormal   Collection Time   09/05/11 10:57 AM      Component Value Range Comment   WBC 9.8  4.0 - 10.5 (K/uL)    RBC 4.00  3.87 - 5.11 (MIL/uL)    Hemoglobin 10.4 (*) 12.0 - 15.0 (g/dL)    HCT 29.5 (*) 62.1 - 46.0 (%)    MCV 79.0  78.0 - 100.0 (fL)    MCH 26.0  26.0 - 34.0 (pg)    MCHC 32.9  30.0 - 36.0 (g/dL)    RDW 30.8 (*) 65.7 - 15.5 (%)    Platelets 402 (*) 150 - 400 (K/uL)   DIFFERENTIAL     Status: Normal   Collection Time   09/05/11 10:57 AM      Component Value Range Comment   Neutrophils Relative 72  43 - 77 (%)    Neutro Abs 7.0  1.7 - 7.7 (K/uL)    Lymphocytes Relative 20  12 - 46 (%)    Lymphs Abs 1.9  0.7 - 4.0 (K/uL)    Monocytes Relative 6  3 - 12 (%)    Monocytes Absolute 0.6  0.1 - 1.0 (K/uL)    Eosinophils Relative  2  0 - 5 (%)    Eosinophils Absolute 0.2  0.0 - 0.7 (K/uL)    Basophils Relative 0  0 - 1 (%)    Basophils Absolute 0.0  0.0 - 0.1 (K/uL)   COMPREHENSIVE METABOLIC PANEL     Status: Abnormal   Collection Time   09/05/11 10:57 AM      Component Value Range Comment   Sodium 134 (*) 135 - 145 (mEq/L)    Potassium 3.7  3.5 - 5.1 (mEq/L)    Chloride 98  96 - 112 (mEq/L)    CO2 25  19 - 32 (mEq/L)    Glucose, Bld 126 (*) 70 - 99 (mg/dL)    BUN 6  6 - 23 (mg/dL)    Creatinine, Ser 8.46  0.50 - 1.10 (mg/dL)  Calcium 9.0  8.4 - 10.5 (mg/dL)    Total Protein 7.4  6.0 - 8.3 (g/dL)    Albumin 3.4 (*) 3.5 - 5.2 (g/dL)    AST 15  0 - 37 (U/L)    ALT 11  0 - 35 (U/L)    Alkaline Phosphatase 44  39 - 117 (U/L)    Total Bilirubin 0.4  0.3 - 1.2 (mg/dL)    GFR calc non Af Amer >90  >90 (mL/min)    GFR calc Af Amer >90  >90 (mL/min)   LIPASE, BLOOD     Status: Normal   Collection Time   09/05/11 10:57 AM      Component Value Range Comment   Lipase 23  11 - 59 (U/L)   URINALYSIS, ROUTINE W REFLEX MICROSCOPIC     Status: Abnormal   Collection Time   09/05/11 11:08 AM      Component Value Range Comment   Color, Urine YELLOW  YELLOW     APPearance CLEAR  CLEAR     Specific Gravity, Urine 1.016  1.005 - 1.030     pH 7.5  5.0 - 8.0     Glucose, UA NEGATIVE  NEGATIVE (mg/dL)    Hgb urine dipstick NEGATIVE  NEGATIVE     Bilirubin Urine NEGATIVE  NEGATIVE     Ketones, ur NEGATIVE  NEGATIVE (mg/dL)    Protein, ur NEGATIVE  NEGATIVE (mg/dL)    Urobilinogen, UA 0.2  0.0 - 1.0 (mg/dL)    Nitrite NEGATIVE  NEGATIVE     Leukocytes, UA TRACE (*) NEGATIVE    URINE MICROSCOPIC-ADD ON     Status: Normal   Collection Time   09/05/11 11:08 AM      Component Value Range Comment   Squamous Epithelial / LPF RARE  RARE     WBC, UA 0-2  <3 (WBC/hpf)     Radiological Exams on Admission: No results found.  Assessment/Plan Principal Problem:  *Diverticulitis Active Problems:  HTN (hypertension)   Depression Anemia Plan: -Admit to MedSurg floor -Continue IV Flagyl and ciprofloxacin, IV fluids, keep n.p.o. for now, antiemetics when necessary. -[facility] was consulted. Patient had 4 CT scans of the abdomen related to similar presentation last year we will hold off any imaging pending GI consult -DVT prophylaxis -Hold lisinopril/HCTZ. We'll place on hydralazine IV when necessary for systolic blood pressure more than 160.   Time Spent on Admission: Approximately 45 minutes  Terrika Zuver 09/05/2011, 3:01 PM

## 2011-09-06 DIAGNOSIS — F329 Major depressive disorder, single episode, unspecified: Secondary | ICD-10-CM

## 2011-09-06 DIAGNOSIS — I10 Essential (primary) hypertension: Secondary | ICD-10-CM

## 2011-09-06 DIAGNOSIS — F3289 Other specified depressive episodes: Secondary | ICD-10-CM

## 2011-09-06 DIAGNOSIS — K5732 Diverticulitis of large intestine without perforation or abscess without bleeding: Secondary | ICD-10-CM

## 2011-09-06 LAB — BASIC METABOLIC PANEL
BUN: 5 mg/dL — ABNORMAL LOW (ref 6–23)
CO2: 27 mEq/L (ref 19–32)
Calcium: 8.7 mg/dL (ref 8.4–10.5)
Chloride: 101 mEq/L (ref 96–112)
Creatinine, Ser: 0.7 mg/dL (ref 0.50–1.10)
GFR calc Af Amer: >90 mL/min (ref >90)
GFR calc non Af Amer: >90 mL/min (ref >90)
Glucose, Bld: 139 mg/dL — ABNORMAL HIGH (ref 70–99)
Potassium: 3.7 mEq/L (ref 3.5–5.1)
Sodium: 137 mEq/L (ref 135–145)

## 2011-09-06 LAB — CBC
Hemoglobin: 10.1 g/dL — ABNORMAL LOW (ref 12.0–15.0)
MCH: 25.3 pg — ABNORMAL LOW (ref 26.0–34.0)
MCHC: 31.7 g/dL (ref 30.0–36.0)

## 2011-09-06 MED ORDER — MAGNESIUM CITRATE PO SOLN
1.0000 | Freq: Once | ORAL | Status: DC
  Filled 2011-09-06: qty 296

## 2011-09-06 NOTE — Progress Notes (Signed)
Subjective: Feels LLQ pain bit worse, requiring intravenous narcotics. No bowel movement in 4 days.  Objective: Vital signs in last 24 hours: Temp:  [98.1 F (36.7 C)-98.8 F (37.1 C)] 98.1 F (36.7 C) (05/21 0600) Pulse Rate:  [72-87] 72  (05/21 0600) Resp:  [16-18] 18  (05/21 0600) BP: (104-137)/(50-74) 104/50 mmHg (05/21 0600) SpO2:  [96 %-100 %] 98 % (05/21 0600) Weight change:  Last BM Date: 09/03/11  PE: GEN:  Obese, non-toxic appearing ABD:  Protuberant, mild LLQ tenderness, present (albeit hypoactive) bowel sounds; no peritonitis  Lab Results: CBC    Component Value Date/Time   WBC 8.2 09/06/2011 0525   RBC 3.99 09/06/2011 0525   HGB 10.1* 09/06/2011 0525   HCT 31.9* 09/06/2011 0525   PLT 377 09/06/2011 0525   MCV 79.9 09/06/2011 0525   MCH 25.3* 09/06/2011 0525   MCHC 31.7 09/06/2011 0525   RDW 16.9* 09/06/2011 0525   LYMPHSABS 1.9 09/05/2011 1057   MONOABS 0.6 09/05/2011 1057   EOSABS 0.2 09/05/2011 1057   BASOSABS 0.0 09/05/2011 1057   CMP     Component Value Date/Time   NA 137 09/06/2011 0525   K 3.7 09/06/2011 0525   CL 101 09/06/2011 0525   CO2 27 09/06/2011 0525   GLUCOSE 139* 09/06/2011 0525   BUN 5* 09/06/2011 0525   CREATININE 0.70 09/06/2011 0525   CALCIUM 8.7 09/06/2011 0525   PROT 7.4 09/05/2011 1057   ALBUMIN 3.4* 09/05/2011 1057   AST 15 09/05/2011 1057   ALT 11 09/05/2011 1057   ALKPHOS 44 09/05/2011 1057   BILITOT 0.4 09/05/2011 1057   GFRNONAA >90 09/06/2011 0525   GFRAA >90 09/06/2011 0525   Assessment:  1.  LLQ abdominal pain.  Principal considerations include mild diverticulitis versus constipation.  No fevers, leukocytosis. 2.  Constipation.  No bowel movement yesterday despite couple doses of Miralax.  Plan:  1.  Clear liquids only; do not advance. 2.  Continue intravenous antibiotics. 3.  OOBTC; ambulate room and halls as tolerated. 4.  Magnesium citrate to help with constipation. 5.  If pain does not improve with continued intravenous  antibiotics and after she has a bowel movement, consider MRI abd/pelvis with contrast (radiation-avoiding imaging). 6.  Will follow.   Freddy Jaksch 09/06/2011, 7:51 AM

## 2011-09-06 NOTE — Progress Notes (Signed)
Subjective: Patient seen and examined, still complaining of left lower quadrant and pain however improved to 5/10. She is tolerating clear liquid diet, she does not had BM yet  Objective: Vital signs in last 24 hours: Temp:  [98.1 F (36.7 C)-98.8 F (37.1 C)] 98.1 F (36.7 C) (05/21 0600) Pulse Rate:  [72-87] 72  (05/21 0600) Resp:  [16-18] 18  (05/21 0600) BP: (104-137)/(50-74) 104/50 mmHg (05/21 0600) SpO2:  [96 %-100 %] 98 % (05/21 0600) Weight change:  Last BM Date: 09/03/11  Intake/Output from previous day:       Physical Exam: General: Alert, awake, oriented x3, in no acute distress. HEENT: No bruits, no goiter. Heart: Regular rate and rhythm, without murmurs, rubs, gallops. Lungs: Clear to auscultation bilaterally. Abdomen: Soft, left lower quadrant tenderness with no rebound or guarding, nondistended, positive bowel sounds. Extremities: No clubbing cyanosis or edema with positive pedal pulses. Neuro: Grossly intact, nonfocal.    Lab Results: Results for orders placed during the hospital encounter of 09/05/11 (from the past 24 hour(s))  CBC     Status: Abnormal   Collection Time   09/05/11 10:57 AM      Component Value Range   WBC 9.8  4.0 - 10.5 (K/uL)   RBC 4.00  3.87 - 5.11 (MIL/uL)   Hemoglobin 10.4 (*) 12.0 - 15.0 (g/dL)   HCT 16.1 (*) 09.6 - 46.0 (%)   MCV 79.0  78.0 - 100.0 (fL)   MCH 26.0  26.0 - 34.0 (pg)   MCHC 32.9  30.0 - 36.0 (g/dL)   RDW 04.5 (*) 40.9 - 15.5 (%)   Platelets 402 (*) 150 - 400 (K/uL)  DIFFERENTIAL     Status: Normal   Collection Time   09/05/11 10:57 AM      Component Value Range   Neutrophils Relative 72  43 - 77 (%)   Neutro Abs 7.0  1.7 - 7.7 (K/uL)   Lymphocytes Relative 20  12 - 46 (%)   Lymphs Abs 1.9  0.7 - 4.0 (K/uL)   Monocytes Relative 6  3 - 12 (%)   Monocytes Absolute 0.6  0.1 - 1.0 (K/uL)   Eosinophils Relative 2  0 - 5 (%)   Eosinophils Absolute 0.2  0.0 - 0.7 (K/uL)   Basophils Relative 0  0 - 1 (%)   Basophils Absolute 0.0  0.0 - 0.1 (K/uL)  COMPREHENSIVE METABOLIC PANEL     Status: Abnormal   Collection Time   09/05/11 10:57 AM      Component Value Range   Sodium 134 (*) 135 - 145 (mEq/L)   Potassium 3.7  3.5 - 5.1 (mEq/L)   Chloride 98  96 - 112 (mEq/L)   CO2 25  19 - 32 (mEq/L)   Glucose, Bld 126 (*) 70 - 99 (mg/dL)   BUN 6  6 - 23 (mg/dL)   Creatinine, Ser 8.11  0.50 - 1.10 (mg/dL)   Calcium 9.0  8.4 - 91.4 (mg/dL)   Total Protein 7.4  6.0 - 8.3 (g/dL)   Albumin 3.4 (*) 3.5 - 5.2 (g/dL)   AST 15  0 - 37 (U/L)   ALT 11  0 - 35 (U/L)   Alkaline Phosphatase 44  39 - 117 (U/L)   Total Bilirubin 0.4  0.3 - 1.2 (mg/dL)   GFR calc non Af Amer >90  >90 (mL/min)   GFR calc Af Amer >90  >90 (mL/min)  LIPASE, BLOOD     Status: Normal  Collection Time   09/05/11 10:57 AM      Component Value Range   Lipase 23  11 - 59 (U/L)  URINALYSIS, ROUTINE W REFLEX MICROSCOPIC     Status: Abnormal   Collection Time   09/05/11 11:08 AM      Component Value Range   Color, Urine YELLOW  YELLOW    APPearance CLEAR  CLEAR    Specific Gravity, Urine 1.016  1.005 - 1.030    pH 7.5  5.0 - 8.0    Glucose, UA NEGATIVE  NEGATIVE (mg/dL)   Hgb urine dipstick NEGATIVE  NEGATIVE    Bilirubin Urine NEGATIVE  NEGATIVE    Ketones, ur NEGATIVE  NEGATIVE (mg/dL)   Protein, ur NEGATIVE  NEGATIVE (mg/dL)   Urobilinogen, UA 0.2  0.0 - 1.0 (mg/dL)   Nitrite NEGATIVE  NEGATIVE    Leukocytes, UA TRACE (*) NEGATIVE   URINE MICROSCOPIC-ADD ON     Status: Normal   Collection Time   09/05/11 11:08 AM      Component Value Range   Squamous Epithelial / LPF RARE  RARE    WBC, UA 0-2  <3 (WBC/hpf)  PREGNANCY, URINE     Status: Normal   Collection Time   09/05/11 11:08 AM      Component Value Range   Preg Test, Ur NEGATIVE  NEGATIVE   CBC     Status: Abnormal   Collection Time   09/05/11  4:40 PM      Component Value Range   WBC 8.7  4.0 - 10.5 (K/uL)   RBC 3.82 (*) 3.87 - 5.11 (MIL/uL)   Hemoglobin 9.9 (*) 12.0  - 15.0 (g/dL)   HCT 16.1 (*) 09.6 - 46.0 (%)   MCV 79.3  78.0 - 100.0 (fL)   MCH 25.9 (*) 26.0 - 34.0 (pg)   MCHC 32.7  30.0 - 36.0 (g/dL)   RDW 04.5 (*) 40.9 - 15.5 (%)   Platelets 345  150 - 400 (K/uL)  CREATININE, SERUM     Status: Normal   Collection Time   09/05/11  4:40 PM      Component Value Range   Creatinine, Ser 0.67  0.50 - 1.10 (mg/dL)   GFR calc non Af Amer >90  >90 (mL/min)   GFR calc Af Amer >90  >90 (mL/min)  BASIC METABOLIC PANEL     Status: Abnormal   Collection Time   09/06/11  5:25 AM      Component Value Range   Sodium 137  135 - 145 (mEq/L)   Potassium 3.7  3.5 - 5.1 (mEq/L)   Chloride 101  96 - 112 (mEq/L)   CO2 27  19 - 32 (mEq/L)   Glucose, Bld 139 (*) 70 - 99 (mg/dL)   BUN 5 (*) 6 - 23 (mg/dL)   Creatinine, Ser 8.11  0.50 - 1.10 (mg/dL)   Calcium 8.7  8.4 - 91.4 (mg/dL)   GFR calc non Af Amer >90  >90 (mL/min)   GFR calc Af Amer >90  >90 (mL/min)  CBC     Status: Abnormal   Collection Time   09/06/11  5:25 AM      Component Value Range   WBC 8.2  4.0 - 10.5 (K/uL)   RBC 3.99  3.87 - 5.11 (MIL/uL)   Hemoglobin 10.1 (*) 12.0 - 15.0 (g/dL)   HCT 78.2 (*) 95.6 - 46.0 (%)   MCV 79.9  78.0 - 100.0 (fL)   MCH 25.3 (*) 26.0 -  34.0 (pg)   MCHC 31.7  30.0 - 36.0 (g/dL)   RDW 78.2 (*) 95.6 - 15.5 (%)   Platelets 377  150 - 400 (K/uL)    Studies/Results: No results found.  Medications:    . sodium chloride   Intravenous Once  . ciprofloxacin  400 mg Intravenous Once  . ciprofloxacin  400 mg Intravenous Q12H  . heparin  5,000 Units Subcutaneous Q8H  . HYDROmorphone  1 mg Intravenous Once  . magnesium citrate  1 Bottle Oral Once  . metronidazole  500 mg Intravenous Once  . metronidazole  500 mg Intravenous Q8H  . ondansetron (ZOFRAN) IV  4 mg Intravenous Once  . polyethylene glycol  17 g Oral BID  . venlafaxine XR  150 mg Oral Q breakfast    hydrALAZINE, HYDROmorphone (DILAUDID) injection, ondansetron (ZOFRAN) IV, ondansetron     . sodium  chloride 100 mL/hr at 09/06/11 0024    Assessment/Plan:  Principal Problem:  *Diverticulitis  Active Problems:  HTN (hypertension)  Depression  Anemia  Plan:  -Continue IV Flagyl and ciprofloxacin, IV fluids, continue clear liquids. for now, antiemetics and laxatives when necessary.  -Appreciated Dr. Dulce Sellar input, he recommended to treat constipation first and if pain does not improve with IV antibiotics and bowel movements to consider MRI abdomen and pelvis with contrast . -Blood pressure is controlled, holding lisinopril/HCTZ for now. Continue hydralazine IV when necessary for systolic blood pressure more than 160. DVT prophylaxis heparin subcutaneous    LOS: 1 day   Pecolia Marando 09/06/2011, 8:22 AM

## 2011-09-07 ENCOUNTER — Inpatient Hospital Stay (HOSPITAL_COMMUNITY): Payer: Medicaid Other

## 2011-09-07 DIAGNOSIS — K5732 Diverticulitis of large intestine without perforation or abscess without bleeding: Secondary | ICD-10-CM

## 2011-09-07 DIAGNOSIS — F329 Major depressive disorder, single episode, unspecified: Secondary | ICD-10-CM

## 2011-09-07 DIAGNOSIS — F3289 Other specified depressive episodes: Secondary | ICD-10-CM

## 2011-09-07 DIAGNOSIS — I10 Essential (primary) hypertension: Secondary | ICD-10-CM

## 2011-09-07 MED ORDER — GADOBENATE DIMEGLUMINE 529 MG/ML IV SOLN
20.0000 mL | Freq: Once | INTRAVENOUS | Status: AC
  Administered 2011-09-07: 20 mL via INTRAVENOUS

## 2011-09-07 NOTE — Progress Notes (Signed)
Subjective: Patient seen and  examined this morning. Still has a lot of pain over LLQ. Has been tolerating clear liquids and had a loose BM today. Denies nausea or vomiting.   Objective:  Vital signs in last 24 hours:  Filed Vitals:   09/06/11 2200 09/07/11 0320 09/07/11 0600 09/07/11 1402  BP: 123/69  121/80 131/85  Pulse: 86  77 79  Temp: 98 F (36.7 C)  97.8 F (36.6 C) 97.8 F (36.6 C)  TempSrc: Oral  Oral Oral  Resp: 20  18 18   Height:  5\' 2"  (1.575 m)    SpO2: 96%  96% 98%    Intake/Output from previous day:   Intake/Output Summary (Last 24 hours) at 09/07/11 1417 Last data filed at 09/06/11 2000  Gross per 24 hour  Intake    100 ml  Output      0 ml  Net    100 ml    Physical Exam:  General: middle  aged obese female  in no acute distress. HEENT: no pallor, no icterus, moist oral mucosa, no JVD, no lymphadenopathy Heart: Normal  s1 &s2  Regular rate and rhythm, without murmurs, rubs, gallops. Lungs: Clear to auscultation bilaterally. Abdomen: Soft, nondistended, positive bowel sounds, tender to palpation over LLQ. Extremities: No clubbing cyanosis or edema with positive pedal pulses. Neuro: Alert, awake, oriented x3, nonfocal.   Lab Results:  Basic Metabolic Panel:    Component Value Date/Time   NA 137 09/06/2011 0525   K 3.7 09/06/2011 0525   CL 101 09/06/2011 0525   CO2 27 09/06/2011 0525   BUN 5* 09/06/2011 0525   CREATININE 0.70 09/06/2011 0525   GLUCOSE 139* 09/06/2011 0525   CALCIUM 8.7 09/06/2011 0525   CBC:    Component Value Date/Time   WBC 8.2 09/06/2011 0525   HGB 10.1* 09/06/2011 0525   HCT 31.9* 09/06/2011 0525   PLT 377 09/06/2011 0525   MCV 79.9 09/06/2011 0525   NEUTROABS 7.0 09/05/2011 1057   LYMPHSABS 1.9 09/05/2011 1057   MONOABS 0.6 09/05/2011 1057   EOSABS 0.2 09/05/2011 1057   BASOSABS 0.0 09/05/2011 1057    No results found for this or any previous visit (from the past 240 hour(s)).  Studies/Results: No results  found.  Medications: Scheduled Meds:   . ciprofloxacin  400 mg Intravenous Q12H  . heparin  5,000 Units Subcutaneous Q8H  . magnesium citrate  1 Bottle Oral Once  . metronidazole  500 mg Intravenous Q8H  . polyethylene glycol  17 g Oral BID  . venlafaxine XR  150 mg Oral Q breakfast   Continuous Infusions:   . sodium chloride 100 mL/hr at 09/07/11 0101   PRN Meds:.hydrALAZINE, HYDROmorphone (DILAUDID) injection, ondansetron (ZOFRAN) IV, ondansetron  Assessment/Plan:  31 y/o AA obese female with hx of HTN and recurrent diverticulitis admitted with acute diverticulitis.  Acute diverticulitis  patient has had 5 episodes of  Sigmoid diverticulitis over past 1 year with multiple CT scans. Further imaging held this admission.  [facility] consulted and given pain persisting while on abx and even after BM , recommended getting MRI of the abdomen and pelvis with contrast which has been ordered  continue with pain management and clear liquids Follow up with MRI results. -Cont IV cipro and flagyl ( day 3). Cont IV fluids  -continue prn zofran  HTN  holding BP meds  Depression  stable . Cont effexor  DVT prophylaxis   Full code    LOS: 2 days  Jiaire Rosebrook 09/07/2011, 2:17 PM

## 2011-09-07 NOTE — Progress Notes (Signed)
Subjective: LLQ pain persists, despite two large bowel movements.  Objective: Vital signs in last 24 hours: Temp:  [97.8 F (36.6 C)-99.1 F (37.3 C)] 97.8 F (36.6 C) (05/22 0600) Pulse Rate:  [68-87] 77  (05/22 0600) Resp:  [18-20] 18  (05/22 0600) BP: (112-123)/(52-80) 121/80 mmHg (05/22 0600) SpO2:  [96 %-100 %] 96 % (05/22 0600) Weight change:  Last BM Date: 09/03/11  PE: GEN:  A little uncomfortable-appearing, but is in no acute distress ABD:  Protuberant; left lower quadrant tenderness with some voluntary guarding; hypoactive bowel sounds; no peritonitis  Lab Results: CBC    Component Value Date/Time   WBC 8.2 09/06/2011 0525   RBC 3.99 09/06/2011 0525   HGB 10.1* 09/06/2011 0525   HCT 31.9* 09/06/2011 0525   PLT 377 09/06/2011 0525   MCV 79.9 09/06/2011 0525   MCH 25.3* 09/06/2011 0525   MCHC 31.7 09/06/2011 0525   RDW 16.9* 09/06/2011 0525   LYMPHSABS 1.9 09/05/2011 1057   MONOABS 0.6 09/05/2011 1057   EOSABS 0.2 09/05/2011 1057   BASOSABS 0.0 09/05/2011 1057   CMP     Component Value Date/Time   NA 137 09/06/2011 0525   K 3.7 09/06/2011 0525   CL 101 09/06/2011 0525   CO2 27 09/06/2011 0525   GLUCOSE 139* 09/06/2011 0525   BUN 5* 09/06/2011 0525   CREATININE 0.70 09/06/2011 0525   CALCIUM 8.7 09/06/2011 0525   PROT 7.4 09/05/2011 1057   ALBUMIN 3.4* 09/05/2011 1057   AST 15 09/05/2011 1057   ALT 11 09/05/2011 1057   ALKPHOS 44 09/05/2011 1057   BILITOT 0.4 09/05/2011 1057   GFRNONAA >90 09/06/2011 0525   GFRAA >90 09/06/2011 0525   Assessment:  1.  LLQ pain.  No improvement after resolution of constipation and after couple days of intravenous antibiotics.  Suspect refractory diverticulitis. 2.  Constipation; resolved without interval improvement in abdominal pain.  Plan:  1.  MRI abd/pelvis with contrast today (radiation-sparing modality) to assess for diverticulitis or other causes of her refractory LLQ pain. 2.  Continue antibiotics, pending MRI findings. 3.  Will  follow.   Freddy Jaksch 09/07/2011, 8:37 AM

## 2011-09-08 DIAGNOSIS — K5732 Diverticulitis of large intestine without perforation or abscess without bleeding: Secondary | ICD-10-CM

## 2011-09-08 DIAGNOSIS — F329 Major depressive disorder, single episode, unspecified: Secondary | ICD-10-CM

## 2011-09-08 DIAGNOSIS — I10 Essential (primary) hypertension: Secondary | ICD-10-CM

## 2011-09-08 NOTE — Progress Notes (Signed)
Subjective: Complains of persistent left lower quadrant pain.  Objective: Vital signs in last 24 hours: Temp:  [97.8 F (36.6 C)-98.8 F (37.1 C)] 98 F (36.7 C) (05/23 0603) Pulse Rate:  [67-79] 67  (05/23 0603) Resp:  [18-20] 20  (05/23 0603) BP: (128-132)/(79-89) 132/89 mmHg (05/23 0603) SpO2:  [95 %-98 %] 95 % (05/23 0603) Weight:  [117.618 kg (259 lb 4.8 oz)] 117.618 kg (259 lb 4.8 oz) (05/22 2000) Weight change:  Last BM Date: 09/07/11  PE: GEN:  Overweight, non-toxic appearing NEURO:  Somnolent (recently given pain medication) but arousable ABD:  Protuberant, left lower quadrant tenderness with hypoactive bowel sounds; no peritonitis  Lab Results: CBC    Component Value Date/Time   WBC 8.2 09/06/2011 0525   RBC 3.99 09/06/2011 0525   HGB 10.1* 09/06/2011 0525   HCT 31.9* 09/06/2011 0525   PLT 377 09/06/2011 0525   MCV 79.9 09/06/2011 0525   MCH 25.3* 09/06/2011 0525   MCHC 31.7 09/06/2011 0525   RDW 16.9* 09/06/2011 0525   LYMPHSABS 1.9 09/05/2011 1057   MONOABS 0.6 09/05/2011 1057   EOSABS 0.2 09/05/2011 1057   BASOSABS 0.0 09/05/2011 1057   MRI abd/pelvis, personally reviewed:  Mild sigmoid diverticulitis without abscess or other complication  Assessment:  1.  Persistent LLQ pain.  MRI with mild acute sigmoid diverticulitis.  No significant improvement with intravenous antibiotics. 2.  Constipation, resolved, with no interval improvement of symptoms.  Plan:  1.  Continue intravenous antibiotics.  Continue clear liquids only for now. 2.  I have obtained surgical consultation.  She had prior episode of diverticulitis (CT-documented) one year ago.  I suspect she might ultimately benefit from sigmoid colectomy for her recurrent diverticulitis.  Would likely be best done semi-electively if we can get her through this current episode, so as to decrease risk of her needing colostomy. 3.  Will follow.   Freddy Jaksch 09/08/2011, 8:59 AM

## 2011-09-08 NOTE — Progress Notes (Signed)
Subjective: Patient seen and examined. Still has LLQ pain , slightly improved. MRI abd and pelvis again shows findings of acute sigmoid  diverticulitis with some pelvic edema which is  Associated with current  Inflammation.  Objective:  Vital signs in last 24 hours:  Filed Vitals:   09/07/11 2000 09/07/11 2149 09/08/11 0603 09/08/11 0945  BP:  128/79 132/89 122/70  Pulse:  76 67 67  Temp:  98.8 F (37.1 C) 98 F (36.7 C) 98.9 F (37.2 C)  TempSrc:  Oral Oral Oral  Resp:  20 20 20   Height: 5\' 2"  (1.575 m)     Weight: 117.618 kg (259 lb 4.8 oz)     SpO2:  97% 95% 96%    Intake/Output from previous day:  No intake or output data in the 24 hours ending 09/08/11 1100  Physical Exam:   General: middle aged obese female in no acute distress.  HEENT: no pallor, no icterus, moist oral mucosa, no JVD, no lymphadenopathy  Heart: Normal s1 &s2 Regular rate and rhythm, without murmurs, rubs, gallops.  Lungs: Clear to auscultation bilaterally.  Abdomen: Soft, nondistended, positive bowel sounds, tender to palpation over LLQ.  Extremities: No clubbing cyanosis or edema with positive pedal pulses.  Neuro: Alert, awake, oriented x3, nonfocal.   Lab Results:  Basic Metabolic Panel:    Component Value Date/Time   NA 137 09/06/2011 0525   K 3.7 09/06/2011 0525   CL 101 09/06/2011 0525   CO2 27 09/06/2011 0525   BUN 5* 09/06/2011 0525   CREATININE 0.70 09/06/2011 0525   GLUCOSE 139* 09/06/2011 0525   CALCIUM 8.7 09/06/2011 0525   CBC:    Component Value Date/Time   WBC 8.2 09/06/2011 0525   HGB 10.1* 09/06/2011 0525   HCT 31.9* 09/06/2011 0525   PLT 377 09/06/2011 0525   MCV 79.9 09/06/2011 0525   NEUTROABS 7.0 09/05/2011 1057   LYMPHSABS 1.9 09/05/2011 1057   MONOABS 0.6 09/05/2011 1057   EOSABS 0.2 09/05/2011 1057   BASOSABS 0.0 09/05/2011 1057    No results found for this or any previous visit (from the past 240 hour(s)).  Studies/Results: Mr Pelvis W Wo Contrast  09/08/2011   *RADIOLOGY REPORT*  Clinical Data:  Left lower quadrant abdominal pain.  History diverticulitis.  MRI ABDOMEN AND PELVIS WITHOUT AND WITH CONTRAST  Technique:  Multiplanar multisequence MR imaging of the abdomen and pelvis was performed both before and after the administration of intravenous contrast.  Contrast: 20mL MULTIHANCE GADOBENATE DIMEGLUMINE 529 MG/ML IV SOLN  Comparison:  09/30/2010  MRI ABDOMEN  Findings:  The liver, spleen, pancreas, and adrenal glands appear unremarkable.  A 7 mm left mid to lower kidney cyst is present.  The kidneys appear otherwise unremarkable.  The gallbladder and biliary system appear unremarkable.  No pathologic retroperitoneal or porta hepatis adenopathy is identified.  IMPRESSION:  1.  No specific upper abdominal abnormality is observed.  MRI PELVIS  Findings: There is abnormal edema tracking in the mesentery adjacent to the sigmoid colon, as illustrated on image 12 of series 32, with hyperemia and wall thickening in this vicinity in addition to colonic diverticulosis.  The appearance is compatible with mild acute diverticulitis.  There are some of the edema extends along the adjacent left adnexa.  Endometrial thickness is 11 mm.  Transitional zone of the uterus unremarkable.  Uterine length is 10.2 cm.  Findings associate with prior cesarean section noted in the lower uterine segment.  Trace free pelvic fluid in  the cul-de-sac noted.  Left ovary measures 3.2 x 2.4 by 2.7 cm and appears normal.  The right ovary measures 3.1 x 2.5 x 1.7 cm and appears normal.  The appendix is surgically absent.  No hip effusion noted.  The left inguinal lymph node is mildly abnormally enlarged with short axis of 1.8 cm.  This is increased compared the prior exam. A left pelvic sidewall lymph node has a short axis diameter of 0.8 cm and a similar sized right pelvic sidewall node is present.  IMPRESSION:  1.  Sigmoid diverticulosis with mild acute diverticulitis.  No abscess observed. 2.  Prior  cesarean section and prior appendectomy. 3.  Trace free pelvic fluid in the cul-de-sac may be physiologic or may be related to the mild diverticulitis. 4.  There is a mildly enlarged left inguinal lymph node.  Although technically nonspecific, this is most likely to be reactive due to the local inflammation/diverticulitis.  Physical exam follow-up to ensure resolution after therapy is suggested; if the left inguinal adenopathy fails to resolve then further imaging workup or tissue sampling may be warranted.  Original Report Authenticated By: Dellia Cloud, M.D.   Mr Abdomen W Wo Contrast  09/08/2011  *RADIOLOGY REPORT*  Clinical Data:  Left lower quadrant abdominal pain.  History diverticulitis.  MRI ABDOMEN AND PELVIS WITHOUT AND WITH CONTRAST  Technique:  Multiplanar multisequence MR imaging of the abdomen and pelvis was performed both before and after the administration of intravenous contrast.  Contrast: 20mL MULTIHANCE GADOBENATE DIMEGLUMINE 529 MG/ML IV SOLN  Comparison:  09/30/2010  MRI ABDOMEN  Findings:  The liver, spleen, pancreas, and adrenal glands appear unremarkable.  A 7 mm left mid to lower kidney cyst is present.  The kidneys appear otherwise unremarkable.  The gallbladder and biliary system appear unremarkable.  No pathologic retroperitoneal or porta hepatis adenopathy is identified.  IMPRESSION:  1.  No specific upper abdominal abnormality is observed.  MRI PELVIS  Findings: There is abnormal edema tracking in the mesentery adjacent to the sigmoid colon, as illustrated on image 12 of series 32, with hyperemia and wall thickening in this vicinity in addition to colonic diverticulosis.  The appearance is compatible with mild acute diverticulitis.  There are some of the edema extends along the adjacent left adnexa.  Endometrial thickness is 11 mm.  Transitional zone of the uterus unremarkable.  Uterine length is 10.2 cm.  Findings associate with prior cesarean section noted in the lower  uterine segment.  Trace free pelvic fluid in the cul-de-sac noted.  Left ovary measures 3.2 x 2.4 by 2.7 cm and appears normal.  The right ovary measures 3.1 x 2.5 x 1.7 cm and appears normal.  The appendix is surgically absent.  No hip effusion noted.  The left inguinal lymph node is mildly abnormally enlarged with short axis of 1.8 cm.  This is increased compared the prior exam. A left pelvic sidewall lymph node has a short axis diameter of 0.8 cm and a similar sized right pelvic sidewall node is present.  IMPRESSION:  1.  Sigmoid diverticulosis with mild acute diverticulitis.  No abscess observed. 2.  Prior cesarean section and prior appendectomy. 3.  Trace free pelvic fluid in the cul-de-sac may be physiologic or may be related to the mild diverticulitis. 4.  There is a mildly enlarged left inguinal lymph node.  Although technically nonspecific, this is most likely to be reactive due to the local inflammation/diverticulitis.  Physical exam follow-up to ensure resolution after therapy is  suggested; if the left inguinal adenopathy fails to resolve then further imaging workup or tissue sampling may be warranted.  Original Report Authenticated By: Dellia Cloud, M.D.    Medications: Scheduled Meds:   . ciprofloxacin  400 mg Intravenous Q12H  . gadobenate dimeglumine  20 mL Intravenous Once  . heparin  5,000 Units Subcutaneous Q8H  . magnesium citrate  1 Bottle Oral Once  . metronidazole  500 mg Intravenous Q8H  . polyethylene glycol  17 g Oral BID  . venlafaxine XR  150 mg Oral Q breakfast   Continuous Infusions:   . sodium chloride 100 mL/hr (09/08/11 0922)   PRN Meds:.hydrALAZINE, HYDROmorphone (DILAUDID) injection, ondansetron (ZOFRAN) IV, ondansetron  Assessment/Plan:  31 y/o AA obese female with hx of HTN and recurrent diverticulitis admitted with acute diverticulitis.   Acute diverticulitis  patient has had 5 episodes of Sigmoid diverticulitis over past 1 year with multiple CT  scans. Further imaging held this admission.  [facility] consulted and given pain persisting while on abx and even after BM , recommended getting MRI of the abdomen and pelvis with contrast which again shows  findings of acute sigmoid  diverticulitis with some pelvic edema which is  Associated with current  Inflammation. continue with pain management and clear liquids for now -Cont IV cipro and flagyl ( day 4). Cont IV fluids  -continue prn zofran  -Dr Dulce Sellar recommends surgery consult given recurrent diverticulitis and benefit of getting sigmoid  colectomy given her younger age and frequent recurrence.  Will follow   HTN  holding BP meds   Depression  stable . Cont effexor   DVT prophylaxis   Full code      LOS: 3 days   Kara [name] 09/08/2011, 11:00 AM

## 2011-09-09 DIAGNOSIS — E119 Type 2 diabetes mellitus without complications: Secondary | ICD-10-CM

## 2011-09-09 DIAGNOSIS — K5732 Diverticulitis of large intestine without perforation or abscess without bleeding: Secondary | ICD-10-CM

## 2011-09-09 DIAGNOSIS — I10 Essential (primary) hypertension: Secondary | ICD-10-CM

## 2011-09-09 DIAGNOSIS — R109 Unspecified abdominal pain: Secondary | ICD-10-CM

## 2011-09-09 DIAGNOSIS — D509 Iron deficiency anemia, unspecified: Secondary | ICD-10-CM

## 2011-09-09 LAB — HEMOGLOBIN A1C: Hgb A1c MFr Bld: 7.1 % — ABNORMAL HIGH (ref <5.7)

## 2011-09-09 LAB — IRON AND TIBC
Saturation Ratios: 11 % — ABNORMAL LOW (ref 20–55)
TIBC: 327 ug/dL (ref 250–470)

## 2011-09-09 LAB — FERRITIN: Ferritin: 17 ng/mL (ref 10–291)

## 2011-09-09 MED ORDER — FERROUS SULFATE 325 (65 FE) MG PO TABS
325.0000 mg | ORAL_TABLET | Freq: Two times a day (BID) | ORAL | Status: DC
  Administered 2011-09-09 – 2011-09-10 (×3): 325 mg via ORAL
  Filled 2011-09-09 (×4): qty 1

## 2011-09-09 MED ORDER — METFORMIN HCL 500 MG PO TABS
500.0000 mg | ORAL_TABLET | Freq: Two times a day (BID) | ORAL | Status: DC
  Administered 2011-09-09 – 2011-09-10 (×3): 500 mg via ORAL
  Filled 2011-09-09 (×4): qty 1

## 2011-09-09 NOTE — Progress Notes (Signed)
Subjective: LLQ pain is a little better.  Objective: Vital signs in last 24 hours: Temp:  [97.7 F (36.5 C)-99 F (37.2 C)] 97.7 F (36.5 C) (05/24 0607) Pulse Rate:  [67-82] 76  (05/24 0607) Resp:  [18-20] 20  (05/24 0607) BP: (124-144)/(72-83) 135/83 mmHg (05/24 0607) SpO2:  [96 %-99 %] 99 % (05/24 0607) Weight change:  Last BM Date: 09/07/11  PE: GEN:  Overweight, NAD ABD:  Protuberant, tender to LLQ; present but hypoactive bowel sounds.  Lab Results: CBC    Component Value Date/Time   WBC 8.2 09/06/2011 0525   RBC 3.99 09/06/2011 0525   HGB 10.1* 09/06/2011 0525   HCT 31.9* 09/06/2011 0525   PLT 377 09/06/2011 0525   MCV 79.9 09/06/2011 0525   MCH 25.3* 09/06/2011 0525   MCHC 31.7 09/06/2011 0525   RDW 16.9* 09/06/2011 0525   LYMPHSABS 1.9 09/05/2011 1057   MONOABS 0.6 09/05/2011 1057   EOSABS 0.2 09/05/2011 1057   BASOSABS 0.0 09/05/2011 1057   Studies/Results: MRI abd/pelvis, personally reviewed, mild acute sigmoid diverticulitis  Assessment:  1.  LLQ pain with diverticulitis seen on MRI scan.  Slowly improving.  Plan:  1.  Continue antibiotics. 2.  Appreciate surgical consultation.  There is concern whether patient might have other process (such as pelvic inflammatory disease) which could be causing her symptoms. 3.  If symptoms don't continue to improve, might consider gynecology consultation to evaluate for pelvic inflammatory disease or any other cause of her LLQ pain besides diverticulitis. 4.  Will continue to follow every couple days, but there is little else I can offer from GI perspective.   Dena Esperanza M 09/09/2011, 10:00 AM

## 2011-09-09 NOTE — Consult Note (Addendum)
Chief Complaint:  Abdominal pain with presumed diverticulitis  History of Present Illness:  Kara [name] is an 31 y.o. female who we were asked to see regarding presumed diverticulitis. She was admitted a year ago with abdominal pain and a CT scan was performed which showed diverticulitis. Apparently she is off and on over the past year had continued abdominal pain and on this admission had an MR scan. I reviewed the scan as well and it reportedly shows some possible sigmoid mesentery thickening or edema also near her adnexa. There is no evidence of any abscess or fluid collection.  She was told it was time to get surgery involved and have elective surgery for this. I am not convinced that she needs surgery at this time. She's not had any abscess ease or definite problems with diverticulitis. And a 31 year old patient something like pelvic inflammatory disease might be another differential consideration. I think before moving on any further disposition is I would want to get a barium enema to actually look at her colon and see if there is a distribution of diverticula and if there is any evidence of complications of diverticulitis.    Past Medical History  Diagnosis Date  . Depression   . Anxiety   . Anemia     no current med.  . Diverticular disease of colon   . 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 Vaginal cyst 08/2011    current antibiotic, will finish 08/31/2011  . Hypertension     under control, has been on med. > 4 yrs.    Past Surgical History  Procedure Date  . Cesarean section 2004, 01/30/2006  . Appendectomy   . Tubal ligation 07/28/2008    laparoscopic  . Carpal tunnel release 08/23/2011    Procedure: CARPAL TUNNEL RELEASE;  Surgeon: Tami Ribas, MD;  Location: [facility];  Service: Orthopedics;  Laterality: Right;    Current Facility-Administered Medications  Medication Dose Route Frequency Provider Last Rate Last Dose  . 0.9 %  sodium chloride  infusion   Intravenous Continuous Antonieta Pert, MD 100 mL/hr at 09/08/11 2323    . ciprofloxacin (CIPRO) IVPB 400 mg  400 mg Intravenous Q12H Antonieta Pert, MD   400 mg at 09/08/11 2323  . heparin injection 5,000 Units  5,000 Units Subcutaneous Q8H Antonieta Pert, MD   5,000 Units at 09/09/11 0555  . hydrALAZINE (APRESOLINE) injection 10 mg  10 mg Intravenous Q6H PRN Antonieta Pert, MD      . HYDROmorphone (DILAUDID) injection 1 mg  1 mg Intravenous Q4H PRN Antonieta Pert, MD   1 mg at 09/09/11 0602  . magnesium citrate solution 1 Bottle  1 Bottle Oral Once Willis Modena, MD      . metroNIDAZOLE (FLAGYL) IVPB 500 mg  500 mg Intravenous Q8H Antonieta Pert, MD   500 mg at 09/09/11 0554  . ondansetron (ZOFRAN) tablet 4 mg  4 mg Oral Q6H PRN Antonieta Pert, MD       Or  . ondansetron [facility]) injection 4 mg  4 mg Intravenous Q6H PRN Antonieta Pert, MD   4 mg at 09/06/11 2312  . polyethylene glycol (MIRALAX / GLYCOLAX) packet 17 g  17 g Oral BID Willis Modena, MD   17 g at 09/05/11 1712  . venlafaxine XR (EFFEXOR-XR) 24 hr capsule 150 mg  150 mg Oral Q breakfast Antonieta Pert, MD   150 mg at 09/09/11 [id number]  Citrus; Pepperoni; Pineapple; Banana; Betadine; Codeine; and Sulfa antibiotics No family history on file. Social History:   reports that she has quit smoking. She has never used smokeless tobacco. She reports that she does not drink alcohol or use illicit drugs.   REVIEW OF SYSTEMS - PERTINENT POSITIVES ONLY: Noncontributory  Physical Exam:   Blood pressure 135/83, pulse 76, temperature 97.7 F (36.5 C), temperature source Oral, resp. rate 20, height 5\' 2"  (1.575 m), weight 259 lb 4.8 oz (117.618 kg), last menstrual period 08/18/2011, SpO2 99.00%. Body mass index is 47.43 kg/(m^2).  Gen:  WDWN [employer] female NAD   Abdomen:  Minimally tender on the right side. Obese abdomen  LABORATORY RESULTS: No results found for this or any previous visit (from the past  48 hour(s)).  RADIOLOGY RESULTS: Mr Pelvis W Wo Contrast  09/08/2011  *RADIOLOGY REPORT*  Clinical Data:  Left lower quadrant abdominal pain.  History diverticulitis.  MRI ABDOMEN AND PELVIS WITHOUT AND WITH CONTRAST  Technique:  Multiplanar multisequence MR imaging of the abdomen and pelvis was performed both before and after the administration of intravenous contrast.  Contrast: 20mL MULTIHANCE GADOBENATE DIMEGLUMINE 529 MG/ML IV SOLN  Comparison:  09/30/2010  MRI ABDOMEN  Findings:  The liver, spleen, pancreas, and adrenal glands appear unremarkable.  A 7 mm left mid to lower kidney cyst is present.  The kidneys appear otherwise unremarkable.  The gallbladder and biliary system appear unremarkable.  No pathologic retroperitoneal or porta hepatis adenopathy is identified.  IMPRESSION:  1.  No specific upper abdominal abnormality is observed.  MRI PELVIS  Findings: There is abnormal edema tracking in the mesentery adjacent to the sigmoid colon, as illustrated on image 12 of series 32, with hyperemia and wall thickening in this vicinity in addition to colonic diverticulosis.  The appearance is compatible with mild acute diverticulitis.  There are some of the edema extends along the adjacent left adnexa.  Endometrial thickness is 11 mm.  Transitional zone of the uterus unremarkable.  Uterine length is 10.2 cm.  Findings associate with prior cesarean section noted in the lower uterine segment.  Trace free pelvic fluid in the cul-de-sac noted.  Left ovary measures 3.2 x 2.4 by 2.7 cm and appears normal.  The right ovary measures 3.1 x 2.5 x 1.7 cm and appears normal.  The appendix is surgically absent.  No hip effusion noted.  The left inguinal lymph node is mildly abnormally enlarged with short axis of 1.8 cm.  This is increased compared the prior exam. A left pelvic sidewall lymph node has a short axis diameter of 0.8 cm and a similar sized right pelvic sidewall node is present.  IMPRESSION:  1.  Sigmoid  diverticulosis with mild acute diverticulitis.  No abscess observed. 2.  Prior cesarean section and prior appendectomy. 3.  Trace free pelvic fluid in the cul-de-sac may be physiologic or may be related to the mild diverticulitis. 4.  There is a mildly enlarged left inguinal lymph node.  Although technically nonspecific, this is most likely to be reactive due to the local inflammation/diverticulitis.  Physical exam follow-up to ensure resolution after therapy is suggested; if the left inguinal adenopathy fails to resolve then further imaging workup or tissue sampling may be warranted.  Original Report Authenticated By: Dellia Cloud, M.D.   Mr Abdomen W Wo Contrast  09/08/2011  *RADIOLOGY REPORT*  Clinical Data:  Left lower quadrant abdominal pain.  History diverticulitis.  MRI ABDOMEN AND PELVIS WITHOUT AND WITH CONTRAST  Technique:  Multiplanar multisequence MR imaging of the abdomen and pelvis was performed both before and after the administration of intravenous contrast.  Contrast: 20mL MULTIHANCE GADOBENATE DIMEGLUMINE 529 MG/ML IV SOLN  Comparison:  09/30/2010  MRI ABDOMEN  Findings:  The liver, spleen, pancreas, and adrenal glands appear unremarkable.  A 7 mm left mid to lower kidney cyst is present.  The kidneys appear otherwise unremarkable.  The gallbladder and biliary system appear unremarkable.  No pathologic retroperitoneal or porta hepatis adenopathy is identified.  IMPRESSION:  1.  No specific upper abdominal abnormality is observed.  MRI PELVIS  Findings: There is abnormal edema tracking in the mesentery adjacent to the sigmoid colon, as illustrated on image 12 of series 32, with hyperemia and wall thickening in this vicinity in addition to colonic diverticulosis.  The appearance is compatible with mild acute diverticulitis.  There are some of the edema extends along the adjacent left adnexa.  Endometrial thickness is 11 mm.  Transitional zone of the uterus unremarkable.  Uterine length is  10.2 cm.  Findings associate with prior cesarean section noted in the lower uterine segment.  Trace free pelvic fluid in the cul-de-sac noted.  Left ovary measures 3.2 x 2.4 by 2.7 cm and appears normal.  The right ovary measures 3.1 x 2.5 x 1.7 cm and appears normal.  The appendix is surgically absent.  No hip effusion noted.  The left inguinal lymph node is mildly abnormally enlarged with short axis of 1.8 cm.  This is increased compared the prior exam. A left pelvic sidewall lymph node has a short axis diameter of 0.8 cm and a similar sized right pelvic sidewall node is present.  IMPRESSION:  1.  Sigmoid diverticulosis with mild acute diverticulitis.  No abscess observed. 2.  Prior cesarean section and prior appendectomy. 3.  Trace free pelvic fluid in the cul-de-sac may be physiologic or may be related to the mild diverticulitis. 4.  There is a mildly enlarged left inguinal lymph node.  Although technically nonspecific, this is most likely to be reactive due to the local inflammation/diverticulitis.  Physical exam follow-up to ensure resolution after therapy is suggested; if the left inguinal adenopathy fails to resolve then further imaging workup or tissue sampling may be warranted.  Original Report Authenticated By: Dellia Cloud, M.D.    Problem List: Patient Active Problem List  Diagnoses  . Diverticulitis  . HTN (hypertension)  . Depression    Assessment & Plan: He would be reasonable to treat her with antibiotics and then after 2-3 weeks bring her in for a contrast enema to look at her colon to see the distribution of diverticula and see if there is any evidence that this is in fact the cause of her pain. We will be glad to see her back in the office in followup after this.    Matt B. Daphine Deutscher, MD, [facility] [phone] beeper [phone]  09/09/2011 9:05 AM    ADDENDUM: Dr. Daphine Deutscher has spoken to me about this patient.  I have contacted our office  who will set the patient up for an outpatient barium enema in 2-3 weeks.  Once this has been completed then she will return to see [place] in the office for follow up.  This information has been placed in the discharge section of her chart.  Dr. Ermalene Searing nurse will call the patient with her diagnostic test time and her follow up appointment time.  Kingsley Herandez E 1:18 PM 09/09/2011

## 2011-09-09 NOTE — Progress Notes (Addendum)
Subjective: Patient seen and examined this am. Her abdominal pain is better today.   Objective:  Vital signs in last 24 hours:  Filed Vitals:   09/09/11 0202 09/09/11 0607 09/09/11 0923 09/09/11 1402  BP: 127/75 135/83 131/78 118/62  Pulse: 71 76 77 70  Temp: 98.6 F (37 C) 97.7 F (36.5 C) 98.5 F (36.9 C) 97.7 F (36.5 C)  TempSrc: Oral Oral Oral Oral  Resp: 18 20 20 18   Height:      Weight:      SpO2: 96% 99% 100% 98%    Intake/Output from previous day:  No intake or output data in the 24 hours ending 09/09/11 1624  Physical Exam:  General:middle aged obese female in no acute distress. HEENT: no pallor, no icterus, moist oral mucosa, no JVD, no lymphadenopathy Heart: Normal  s1 &s2  Regular rate and rhythm, without murmurs, rubs, gallops. Lungs: Clear to auscultation bilaterally. Abdomen: Soft, LLQ tender, nondistended, positive bowel sounds. Pelvic exam done on 5/24 with no cervical motion tenderness or discharge. Extremities: No clubbing cyanosis or edema with positive pedal pulses. Neuro: Alert, awake, oriented x3, nonfocal.   Lab Results:  Basic Metabolic Panel:    Component Value Date/Time   NA 137 09/06/2011 0525   K 3.7 09/06/2011 0525   CL 101 09/06/2011 0525   CO2 27 09/06/2011 0525   BUN 5* 09/06/2011 0525   CREATININE 0.70 09/06/2011 0525   GLUCOSE 139* 09/06/2011 0525   CALCIUM 8.7 09/06/2011 0525   CBC:    Component Value Date/Time   WBC 8.2 09/06/2011 0525   HGB 10.1* 09/06/2011 0525   HCT 31.9* 09/06/2011 0525   PLT 377 09/06/2011 0525   MCV 79.9 09/06/2011 0525   NEUTROABS 7.0 09/05/2011 1057   LYMPHSABS 1.9 09/05/2011 1057   MONOABS 0.6 09/05/2011 1057   EOSABS 0.2 09/05/2011 1057   BASOSABS 0.0 09/05/2011 1057    No results found for this or any previous visit (from the past 240 hour(s)).  Studies/Results: Mr Pelvis W Wo Contrast  09/08/2011  *RADIOLOGY REPORT*  Clinical Data:  Left lower quadrant abdominal pain.  History diverticulitis.  MRI  ABDOMEN AND PELVIS WITHOUT AND WITH CONTRAST  Technique:  Multiplanar multisequence MR imaging of the abdomen and pelvis was performed both before and after the administration of intravenous contrast.  Contrast: 20mL MULTIHANCE GADOBENATE DIMEGLUMINE 529 MG/ML IV SOLN  Comparison:  09/30/2010  MRI ABDOMEN  Findings:  The liver, spleen, pancreas, and adrenal glands appear unremarkable.  A 7 mm left mid to lower kidney cyst is present.  The kidneys appear otherwise unremarkable.  The gallbladder and biliary system appear unremarkable.  No pathologic retroperitoneal or porta hepatis adenopathy is identified.  IMPRESSION:  1.  No specific upper abdominal abnormality is observed.  MRI PELVIS  Findings: There is abnormal edema tracking in the mesentery adjacent to the sigmoid colon, as illustrated on image 12 of series 32, with hyperemia and wall thickening in this vicinity in addition to colonic diverticulosis.  The appearance is compatible with mild acute diverticulitis.  There are some of the edema extends along the adjacent left adnexa.  Endometrial thickness is 11 mm.  Transitional zone of the uterus unremarkable.  Uterine length is 10.2 cm.  Findings associate with prior cesarean section noted in the lower uterine segment.  Trace free pelvic fluid in the cul-de-sac noted.  Left ovary measures 3.2 x 2.4 by 2.7 cm and appears normal.  The right ovary measures 3.1 x 2.5  x 1.7 cm and appears normal.  The appendix is surgically absent.  No hip effusion noted.  The left inguinal lymph node is mildly abnormally enlarged with short axis of 1.8 cm.  This is increased compared the prior exam. A left pelvic sidewall lymph node has a short axis diameter of 0.8 cm and a similar sized right pelvic sidewall node is present.  IMPRESSION:  1.  Sigmoid diverticulosis with mild acute diverticulitis.  No abscess observed. 2.  Prior cesarean section and prior appendectomy. 3.  Trace free pelvic fluid in the cul-de-sac may be physiologic  or may be related to the mild diverticulitis. 4.  There is a mildly enlarged left inguinal lymph node.  Although technically nonspecific, this is most likely to be reactive due to the local inflammation/diverticulitis.  Physical exam follow-up to ensure resolution after therapy is suggested; if the left inguinal adenopathy fails to resolve then further imaging workup or tissue sampling may be warranted.  Original Report Authenticated By: Dellia Cloud, M.D.   Mr Abdomen W Wo Contrast  09/08/2011  *RADIOLOGY REPORT*  Clinical Data:  Left lower quadrant abdominal pain.  History diverticulitis.  MRI ABDOMEN AND PELVIS WITHOUT AND WITH CONTRAST  Technique:  Multiplanar multisequence MR imaging of the abdomen and pelvis was performed both before and after the administration of intravenous contrast.  Contrast: 20mL MULTIHANCE GADOBENATE DIMEGLUMINE 529 MG/ML IV SOLN  Comparison:  09/30/2010  MRI ABDOMEN  Findings:  The liver, spleen, pancreas, and adrenal glands appear unremarkable.  A 7 mm left mid to lower kidney cyst is present.  The kidneys appear otherwise unremarkable.  The gallbladder and biliary system appear unremarkable.  No pathologic retroperitoneal or porta hepatis adenopathy is identified.  IMPRESSION:  1.  No specific upper abdominal abnormality is observed.  MRI PELVIS  Findings: There is abnormal edema tracking in the mesentery adjacent to the sigmoid colon, as illustrated on image 12 of series 32, with hyperemia and wall thickening in this vicinity in addition to colonic diverticulosis.  The appearance is compatible with mild acute diverticulitis.  There are some of the edema extends along the adjacent left adnexa.  Endometrial thickness is 11 mm.  Transitional zone of the uterus unremarkable.  Uterine length is 10.2 cm.  Findings associate with prior cesarean section noted in the lower uterine segment.  Trace free pelvic fluid in the cul-de-sac noted.  Left ovary measures 3.2 x 2.4 by 2.7 cm  and appears normal.  The right ovary measures 3.1 x 2.5 x 1.7 cm and appears normal.  The appendix is surgically absent.  No hip effusion noted.  The left inguinal lymph node is mildly abnormally enlarged with short axis of 1.8 cm.  This is increased compared the prior exam. A left pelvic sidewall lymph node has a short axis diameter of 0.8 cm and a similar sized right pelvic sidewall node is present.  IMPRESSION:  1.  Sigmoid diverticulosis with mild acute diverticulitis.  No abscess observed. 2.  Prior cesarean section and prior appendectomy. 3.  Trace free pelvic fluid in the cul-de-sac may be physiologic or may be related to the mild diverticulitis. 4.  There is a mildly enlarged left inguinal lymph node.  Although technically nonspecific, this is most likely to be reactive due to the local inflammation/diverticulitis.  Physical exam follow-up to ensure resolution after therapy is suggested; if the left inguinal adenopathy fails to resolve then further imaging workup or tissue sampling may be warranted.  Original Report Authenticated By:  Dellia Cloud, M.D.    Medications: Scheduled Meds:   . ciprofloxacin  400 mg Intravenous Q12H  . heparin  5,000 Units Subcutaneous Q8H  . magnesium citrate  1 Bottle Oral Once  . metronidazole  500 mg Intravenous Q8H  . polyethylene glycol  17 g Oral BID  . venlafaxine XR  150 mg Oral Q breakfast   Continuous Infusions:   . sodium chloride 100 mL/hr at 09/09/11 1030   PRN Meds:.hydrALAZINE, HYDROmorphone (DILAUDID) injection, ondansetron (ZOFRAN) IV, ondansetron  Assessment/Plan:  31 y/o AA obese female with hx of HTN and recurrent diverticulitis admitted with acute diverticulitis.   Acute diverticulitis  patient has had 5 episodes of Sigmoid diverticulitis over past 1 year with multiple CT scans. Further imaging held this admission.  [facility] consulted and given pain persisting while on abx and even after BM , recommended getting MRI of the  abdomen and pelvis with contrast which again shows findings of acute sigmoid diverticulitis with some pelvic edema which is Associated with current Inflammation.  continue with pain management and clear liquids for now  -Cont IV cipro and flagyl ( day 5). Cont IV fluids  -continue prn zofran  -Dr Dulce Sellar recommended surgery consult given recurrent diverticulitis and benefit of getting sigmoid colectomy given her younger age and frequent recurrence. Seen by surgery consult . Recommend patient doesnot need surgery at this time and want to r/o other causes like PID. Patient has findings of diverticulitis on all the imaging done  When symptomatic. surgery recommends for barium enema when she follows up in 2 -3 weeks as outpatient. To look for any complications of her diverticulitis. -pelvic exam done and no cervical motion tenderness or discharge noted. Labs sent for gonorrhea /chlamydia probe. She does give hx of high risk sexual behavior. Has had 6 sexual partners with unprotected sex.   HTN  holding BP meds    Anemia Has low iron and iron sat Will start her on iron tablets  Diabetes mellitus Hb A1C of 7.1 Will start her on po metformin  Depression  stable . Cont effexor   Carpel tunnel syndrome of right hand  recent surgery done   stitches removed and stable  DVT prophylaxis   Full code   D/c home if pain improves and able to tolerate advanced diet tomorrow   Counseled on weight loss, dietary adherence and protected sexual activity.   LOS: 4 days   Yamari Ventola 09/09/2011, 4:24 PM

## 2011-09-09 NOTE — Care Management Note (Signed)
    Page 1 of 1   09/09/2011     3:42:36 PM   CARE MANAGEMENT NOTE 09/09/2011  Patient:  SEATTLE, DALPORTO A   Account Number:  [id number]  Date Initiated:  09/09/2011  Documentation initiated by:  Onnie Boer  Subjective/Objective Assessment:   PT WAS ADMITTED WITH ABD PAIN     Action/Plan:   PROGRESSION OF CARE AND DISCHARGE PLANNING   Anticipated DC Date:  09/12/2011   Anticipated DC Plan:  HOME/SELF CARE      DC Planning Services  CM consult      Choice offered to / List presented to:             Status of service:  In process, will continue to follow Medicare Important Message given?   (If response is "NO", the following Medicare IM given date fields will be blank) Date Medicare IM given:   Date Additional Medicare IM given:    Discharge Disposition:    Per UR Regulation:  Reviewed for med. necessity/level of care/duration of stay  If discussed at Long Length of Stay Meetings, dates discussed:    Comments:  09/09/11 Onnie Boer, RN, BSN 1541 PT WAS ADMITTED WITH ABD PAIN FOUND TO BE DIVERTICULITIS. PLAN IS TO WATCH FOR A FEW DAYS TO SEE IF SURGERY IS NEEDED. PT MAY REQUIRE A COLOSTOMY IF SURGERY IS NEEDED. WILL F/U ON DC PLAN

## 2011-09-10 DIAGNOSIS — K5732 Diverticulitis of large intestine without perforation or abscess without bleeding: Secondary | ICD-10-CM

## 2011-09-10 DIAGNOSIS — D509 Iron deficiency anemia, unspecified: Secondary | ICD-10-CM

## 2011-09-10 DIAGNOSIS — I10 Essential (primary) hypertension: Secondary | ICD-10-CM

## 2011-09-10 DIAGNOSIS — E119 Type 2 diabetes mellitus without complications: Secondary | ICD-10-CM

## 2011-09-10 LAB — GC/CHLAMYDIA PROBE AMP, GENITAL
Chlamydia, DNA Probe: NEGATIVE
GC Probe Amp, Genital: NEGATIVE

## 2011-09-10 MED ORDER — FERROUS SULFATE 325 (65 FE) MG PO TABS: 325.0000 mg | ORAL_TABLET | Freq: Two times a day (BID) | ORAL | Status: DC

## 2011-09-10 MED ORDER — IBUPROFEN 200 MG PO TABS: 400.0000 mg | ORAL_TABLET | Freq: Four times a day (QID) | ORAL | Status: AC | PRN

## 2011-09-10 MED ORDER — METRONIDAZOLE 500 MG PO TABS: 500.0000 mg | ORAL_TABLET | Freq: Three times a day (TID) | ORAL | Status: AC

## 2011-09-10 MED ORDER — CIPROFLOXACIN HCL 500 MG PO TABS: 500.0000 mg | ORAL_TABLET | Freq: Two times a day (BID) | ORAL | Status: AC

## 2011-09-10 NOTE — Discharge Instructions (Addendum)
Diverticulitis A diverticulum is a small pouch or sac on the colon. Diverticulosis is the presence of these diverticula on the colon. Diverticulitis is the irritation (inflammation) or infection of diverticula. CAUSES  The colon and its diverticula contain bacteria. If food particles block the tiny opening to a diverticulum, the bacteria inside can grow and cause an increase in pressure. This leads to infection and inflammation and is called diverticulitis. SYMPTOMS   Abdominal pain and tenderness. Usually, the pain is located on the left side of your abdomen. However, it could be located elsewhere.   Fever.   Bloating.   Feeling sick to your stomach (nausea).   Throwing up (vomiting).   Abnormal stools.  DIAGNOSIS  Your caregiver will take a history and perform a physical exam. Since many things can cause abdominal pain, other tests may be necessary. Tests may include:  Blood tests.   Urine tests.   X-ray of the abdomen.   CT scan of the abdomen.  Sometimes, surgery is needed to determine if diverticulitis or other conditions are causing your symptoms. TREATMENT  Most of the time, you can be treated without surgery. Treatment includes:  Resting the bowels by only having liquids for a few days. As you improve, you will need to eat a low-fiber diet.   Intravenous (IV) fluids if you are losing body fluids (dehydrated).   Antibiotic medicines that treat infections may be given.   Pain and nausea medicine, if needed.   Surgery if the inflamed diverticulum has burst.  HOME CARE INSTRUCTIONS   Try a clear liquid diet (broth, tea, or water for as long as directed by your caregiver). You may then gradually begin a low-fiber diet as tolerated. A low-fiber diet is a diet with less than 10 grams of fiber. Choose the foods below to reduce fiber in the diet:   White breads, cereals, rice, and pasta.   Cooked fruits and vegetables or soft fresh fruits and vegetables without the skin.     Ground or well-cooked tender beef, ham, veal, lamb, pork, or poultry.   Eggs and seafood.   After your diverticulitis symptoms have improved, your caregiver may put you on a high-fiber diet. A high-fiber diet includes 14 grams of fiber for every 1000 calories consumed. For a standard 2000 calorie diet, you would need 28 grams of fiber. Follow these diet guidelines to help you increase the fiber in your diet. It is important to slowly increase the amount fiber in your diet to avoid gas, constipation, and bloating.   Choose whole-grain breads, cereals, pasta, and brown rice.   Choose fresh fruits and vegetables with the skin on. Do not overcook vegetables because the more vegetables are cooked, the more fiber is lost.   Choose more nuts, seeds, legumes, dried peas, beans, and lentils.   Look for food products that have greater than 3 grams of fiber per serving on the Nutrition Facts label.   Take all medicine as directed by your caregiver.   If your caregiver has given you a follow-up appointment, it is very important that you go. Not going could result in lasting (chronic) or permanent injury, pain, and disability. If there is any problem keeping the appointment, call to reschedule.  SEEK MEDICAL CARE IF:   Your pain does not improve.   You have a hard time advancing your diet beyond clear liquids.   Your bowel movements do not return to normal.  SEEK IMMEDIATE MEDICAL CARE IF:   Your pain becomes   worse.   You have an oral temperature above 102 F (38.9 C), not controlled by medicine.   You have repeated vomiting.   You have bloody or black, tarry stools.   Symptoms that brought you to your caregiver become worse or are not getting better.  MAKE SURE YOU:   Understand these instructions.   Will watch your condition.   Will get help right away if you are not doing well or get worse.  Document Released: 01/12/2005 Document Revised: 03/24/2011 Document Reviewed:  05/10/2010 Exit[employer] Patient Information 2012 [employer].Low Fiber and Residue Restricted Diet A low fiber diet restricts foods that contain carbohydrates that are not digested in the small intestine. A diet containing about 10 g of fiber is considered low fiber. The diet needs to be individualized to suit patient tolerances and preferences and to avoid unnecessary restrictions. Generally, the foods emphasized in a low fiber diet have no skins or seeds. They may have been processed to remove bran, germ, or husks. Cooking may not necessarily eliminate the fiber. Cooking may, in fact, enable a greater quantity of fiber to be consumed in a lesser volume. Legumes and nuts are also restricted. The term low residue has also been used to describe low fiber diets, although the two are not the same. Residue refers to any substance that adds to bowel (colonic) contents, such as sloughed cells and intestinal bacteria, in addition to fiber. Residue-containing foods, prunes and prune juice, milk, and connective tissue from meats may also need to be eliminated. It is important to eliminate these foods during sudden (acute) attacks of inflammatory bowel disease, when there is a partial obstruction due to another reason, or when minimal fecal output is desired. When these problems are gone, a more normal diet may be used. PURPOSE  Prevent blockage of a partially obstructed or narrowed gastrointestinal tract.   Reduce stool weight and volume.   Slow the movement of waste.  WHEN IS THIS DIET USED?  Acute phase of Crohn's disease, ulcerative colitis, regional enteritis, or diverticulitis.   Narrowing (stenosis) of intestinal or esophageal tubes (lumina).   Transitional diet following surgery, injury (trauma), or illness.  ADEQUACY This diet is nutritionally adequate based on individual food choices according to the Recommended Dietary Allowances of the National Research Council. CHOOSING FOODS Check labels,  especially on foods from the starch list. Often, dietary fiber content is listed with the Nutrition Facts panel.  Breads and Starches  Allowed: White, French, and pita breads, plain rolls, buns, or sweet rolls, doughnuts, waffles, pancakes, bagels. Plain muffins, sweet breads, biscuits, matzoth. Flour. Soda, saltine, or graham crackers. Pretzels, rusks, melba toast, zwieback. Cooked cereals: cornmeal, farina, cream cereals. Dry cereals: refined corn, wheat, rice, and oat cereals (check label). Potatoes prepared any way without skins, refined macaroni, spaghetti, noodles, refined rice.   Avoid: Bread, rolls, or crackers made with whole-wheat, multigrains, rye, bran seeds, nuts, or coconut. Corn tortillas, table-shells. Corn chips, tortilla chips. Cereals containing whole-grains, multigrains, bran, coconut, nuts, or raisins. Cooked or dry oatmeal. Coarse wheat cereals, granola. Cereals advertised as "high fiber." Potato skins. Whole-grain pasta, wild or brown rice. Popcorn.  Vegetables  Allowed:  Strained tomato and vegetable juices. Fresh: tender lettuce, cucumber, cabbage, spinach, bean sprouts. Cooked, canned: asparagus, bean sprouts, cut green or wax beans, cauliflower, pumpkin, beets, mushrooms, olives, spinach, yellow squash, tomato, tomato sauce (no seeds), zucchini (peeled), turnips. Canned sweet potatoes. Small amounts of celery, onion, radish, and green pepper may be used. Keep servings limited   to  cup.   Avoid: Fresh, cooked, or canned: artichokes, baked beans, beet greens, broccoli, Brussels sprouts, French-style green beans, corn, kale, legumes, peas, sweet potatoes. Cooked: green or red cabbage, spinach. Avoid large servings of any vegetables.  Fruit  Allowed:  All fruit juices except prune juice. Cooked or canned: apricots applesauce, cantaloupe, cherries, grapefruit, grapes, kiwi, mandarin oranges, peaches, pears, fruit cocktail, pineapple, plums, watermelon. Fresh: banana, grapes,  cantaloupe, avocado, cherries, pineapple, grapefruit, kiwi, nectarines, peaches, oranges, blueberries, plums. Keep servings limited to  cup or 1 piece.   Avoid: Fresh: apple with or without skin, apricots, mango, pears, raspberries, strawberries. Prune juice, stewed or dried prunes. Dried fruits, raisins, dates. Avoid large servings of all fresh fruits.  Meat and Meat Substitutes  Allowed:  Ground or well-cooked tender beef, ham, veal, lamb, pork, or poultry. Eggs, plain cheese. Fish, oysters, shrimp, lobster, other seafood. Liver, organ meats.   Avoid: Tough, fibrous meats with gristle. Peanut butter, smooth or chunky. Cheese with seeds, nuts, or other foods not allowed. Nuts, seeds, legumes, dried peas, beans, lentils.  Milk  Allowed:  All milk products except those not allowed. Milk and milk product consumption should be minimal when low residue is desired.   Avoid: Yogurt that contains nuts or seeds.  Soups and Combination Foods  Allowed:  Bouillon, broth, or cream soups made from allowed foods. Any strained soup. Casseroles or mixed dishes made with allowed foods.   Avoid: Soups made from vegetables that are not allowed or that contain other foods not allowed.  Desserts and Sweets  Allowed:  Plain cakes and cookies, pie made with allowed fruit, pudding, custard, cream pie. Gelatin, fruit, ice, sherbet, frozen ice pops. Ice cream, ice milk without nuts. Plain hard candy, honey, jelly, molasses, syrup, sugar, chocolate syrup, gumdrops, marshmallows.   Avoid: Desserts, cookies, or candies that contain nuts, peanut butter, or dried fruits. Jams, preserves with seeds, marmalade.  Fats and Oils  Allowed:  Margarine, butter, cream, mayonnaise, salad oils, plain salad dressings made from allowed foods. Plain gravy, crisp bacon without rind.   Avoid: Seeds, nuts, olives. Avocados.  Beverages  Allowed:  All, except those listed to avoid.   Avoid: Fruit juices with high pulp, prune juice.   Condiments  Allowed:  Ketchup, mustard, horseradish, vinegar, cream sauce, cheese sauce, cocoa powder. Spices in moderation: allspice, basil, bay leaves, celery powder or leaves, cinnamon, cumin powder, curry powder, ginger, mace, marjoram, onion or garlic powder, oregano, paprika, parsley flakes, ground pepper, rosemary, sage, savory, tarragon, thyme, turmeric.   Avoid: Coconut, pickles.  SAMPLE MEAL PLAN The following menu is provided as a sample. Your daily menu plans will vary. Be sure to include a minimum of the following each day in order to provide essential nutrients for the adult:  Starch/Bread/Cereal Group, 6 servings.   Fruit/Vegetable Group, 5 servings.   Meat/Meat Substitute Group, 2 servings.   Milk/Milk Substitute Group, 2 servings.  A serving is equal to  cup for fruits, vegetables, and cooked cereals or 1 piece for foods such as a piece of bread, 1 orange, or 1 apple. For dry cereals and crackers, use serving sizes listed on the label. Combination foods may count as full or partial servings from various food groups. Fats, desserts, and sweets may be added to the meal plan after the requirements for essential nutrients are met. SAMPLE MENU Breakfast   cup orange juice.   1 boiled egg.   1 slice white toast.   Margarine.      cup cornflakes.   1 cup milk.   Beverage.  Lunch   cup chicken noodle soup.   2 to 3 oz sliced roast beef.   2 slices seedless rye bread.   Mayonnaise.    cup tomato juice.   1 small banana.   Beverage.  Dinner  3 oz baked chicken.    cup scalloped potatoes.    cup cooked beets.   White dinner roll.   Margarine.    cup canned peaches.   Beverage.  Document Released: 09/24/2001 Document Revised: 03/24/2011 Document Reviewed: 03/07/2011 Exit[employer] Patient Information 2012 [employer]. 

## 2011-09-10 NOTE — Discharge Summary (Signed)
Patient ID: Kara [name] MRN: [id number] DOB/AGE: 07/02/1980 31 y.o.  Admit date: 09/05/2011 Discharge date: 09/10/2011  Primary Care Physician:  Dorrene German, MD, MD  Discharge Diagnoses:     Principal Problem:  *Sigmoid diverticulitis, recurrent  Active Problems:  Diabetes mellitus  HTN (hypertension)  Depression  Iron deficiency anemia   Medication List  As of 09/10/2011  3:43 PM   TAKE these medications         ciprofloxacin 500 MG tablet   Commonly known as: CIPRO   Take 1 tablet (500 mg total) by mouth 2 (two) times daily.      desvenlafaxine 100 MG 24 hr tablet   Commonly known as: PRISTIQ   Take 100 mg by mouth every morning.      ferrous sulfate 325 (65 FE) MG tablet   Take 1 tablet (325 mg total) by mouth 2 (two) times daily with a meal.      hydrOXYzine 25 MG tablet   Commonly known as: ATARAX/VISTARIL   Take 25 mg by mouth 2 (two) times daily.      ibuprofen 200 MG tablet   Commonly known as: ADVIL,MOTRIN   Take 2 tablets (400 mg total) by mouth every 6 (six) hours as needed for pain.      lisinopril-hydrochlorothiazide 10-12.5 MG per tablet   Commonly known as: PRINZIDE,ZESTORETIC   Take 1 tablet by mouth every morning.      metroNIDAZOLE 500 MG tablet   Commonly known as: FLAGYL   Take 1 tablet (500 mg total) by mouth 3 (three) times daily.            Disposition and Follow-up:  Home with outpt follow up with PCP and general surgery clinic . Please call the office if not contacted in 1 week  Consults:   Chrissie Noa outlaw ( GI) Wenda Low ( surgery)  Significant Diagnostic Studies:  MRI ABDOMEN AND PELVIS WITHOUT AND WITH CONTRAST  Technique: Multiplanar multisequence MR imaging of the abdomen and  pelvis was performed both before and after the administration of  intravenous contrast.  Contrast: 20mL MULTIHANCE GADOBENATE DIMEGLUMINE 529 MG/ML IV SOLN  Comparison: 09/30/2010  MRI ABDOMEN  Findings: The liver, spleen, pancreas,  and adrenal glands appear  unremarkable.  A 7 mm left mid to lower kidney cyst is present. The kidneys  appear otherwise unremarkable.  The gallbladder and biliary system appear unremarkable.  No pathologic retroperitoneal or porta hepatis adenopathy is  identified.  IMPRESSION:  1. No specific upper abdominal abnormality is observed.  MRI PELVIS  Findings: There is abnormal edema tracking in the mesentery  adjacent to the sigmoid colon, as illustrated on image 12 of series  32, with hyperemia and wall thickening in this vicinity in addition  to colonic diverticulosis. The appearance is compatible with mild  acute diverticulitis. There are some of the edema extends along  the adjacent left adnexa.  Endometrial thickness is 11 mm. Transitional zone of the uterus  unremarkable. Uterine length is 10.2 cm. Findings associate with  prior cesarean section noted in the lower uterine segment. Trace  free pelvic fluid in the cul-de-sac noted.  Left ovary measures 3.2 x 2.4 by 2.7 cm and appears normal. The  right ovary measures 3.1 x 2.5 x 1.7 cm and appears normal.  The appendix is surgically absent. No hip effusion noted.  The left inguinal lymph node is mildly abnormally enlarged with  short axis of 1.8 cm. This is increased compared the prior exam.  A left pelvic sidewall lymph node has a short axis diameter of 0.8  cm and a similar sized right pelvic sidewall node is present.  IMPRESSION:  1. Sigmoid diverticulosis with mild acute diverticulitis. No  abscess observed.  2. Prior cesarean section and prior appendectomy.  3. Trace free pelvic fluid in the cul-de-sac may be physiologic or  may be related to the mild diverticulitis.  4. There is a mildly enlarged left inguinal lymph node. Although  technically nonspecific, this is most likely to be reactive due to  the local inflammation/diverticulitis. Physical exam follow-up to  ensure resolution after therapy is suggested; if the left  inguinal  adenopathy fails to resolve then further imaging workup or tissue  sampling may be warranted.   Brief H and P: For complete details please refer to admission H and P, but in brief 31 years old [place] woman with history of diverticulitis, presented to the ER today with chief complain of left lower quadrant pain for 2 days. She describes her pain as a dull, was more than 10 out of 10 however currently at 7/10, associated with nausea or vomiting. She also felt hot and sweaty. Patient stated that it's similar to her previous diverticulitis flares, she took left over ciprofloxacin and Flagyl that she had at home however she was vomiting. Her condition worsened and she decided to come to the emergency room today.  In the ED patient was afebrile, labs showed no leukocytosis. She was started on IV Flagyl and ciprofloxacin and we were consulted to admit for further management   Physical Exam on Discharge:  Filed Vitals:   09/09/11 1832 09/09/11 2147 09/10/11 0638 09/10/11 1417  BP: 131/77 154/84 126/75 136/85  Pulse: 79 76 72 97  Temp: 99.1 F (37.3 C) 98.9 F (37.2 C) 98.3 F (36.8 C) 98.6 F (37 C)  TempSrc: Oral Oral Oral Oral  Resp: 18 20 20 20   Height:      Weight:      SpO2: 99% 99% 99% 99%    No intake or output data in the 24 hours ending 09/10/11 1543  General: Alert, awake, oriented x3, in no acute distress. HEENT: No bruits, no goiter. Heart: Regular rate and rhythm, without murmurs, rubs, gallops. Lungs: Clear to auscultation bilaterally. Abdomen: Soft,mild tenderness over LLQ , nondistended, positive bowel sounds. Pelvic exam done on 5/24 with o discharged and no cervical motion tenderness Extremities: No clubbing cyanosis or edema with positive pedal pulses. Neuro: Grossly intact, nonfocal.  CBC:    Component Value Date/Time   WBC 8.2 09/06/2011 0525   HGB 10.1* 09/06/2011 0525   HCT 31.9* 09/06/2011 0525   PLT 377 09/06/2011 0525   MCV 79.9 09/06/2011  0525   NEUTROABS 7.0 09/05/2011 1057   LYMPHSABS 1.9 09/05/2011 1057   MONOABS 0.6 09/05/2011 1057   EOSABS 0.2 09/05/2011 1057   BASOSABS 0.0 09/05/2011 1057    Basic Metabolic Panel:    Component Value Date/Time   NA 137 09/06/2011 0525   K 3.7 09/06/2011 0525   CL 101 09/06/2011 0525   CO2 27 09/06/2011 0525   BUN 5* 09/06/2011 0525   CREATININE 0.70 09/06/2011 0525   GLUCOSE 139* 09/06/2011 0525   CALCIUM 8.7 09/06/2011 0525     Acute recurrent sigmoid  diverticulitis  -patient has had 5 episodes of Sigmoid diverticulitis over past 1 year with multiple CT scans.   -[facility] consulted and given pain persisting while on abx and even after BM ,  recommended getting MRI of the abdomen and pelvis with contrast which again shows findings of acute sigmoid diverticulitis with some pelvic edema which is associated with current Inflammation.  continue with pain management and clear liquids for now  -given IV cipro and flagyl   -Dr Dulce Sellar recommended surgery consult given recurrent diverticulitis and benefit of getting sigmoid colectomy given her younger age and frequent recurrence. Seen by surgery consult . Recommend patient doesnot need surgery at this time and want to r/o other causes like PID. Patient has findings of diverticulitis on all the imaging done during acute symptoms. surgery recommends for barium enema when she follows up in 2 -3 weeks as outpatient to look for any complications of her diverticulitis.   -pelvic exam done and no cervical motion tenderness or discharge noted. Labs sent for gonorrhea /chlamydia probe and should be followed as outpt . She does give hx of high risk sexual behavior. Has had 6 sexual partners with unprotected sex.   patient tolertated advanced diet and stable on discharge home patient will be discharged on po cipro and flagyl to complete a 10 day course and follow up with surgery.    HTN  Resume home  BP meds  Anemia  Has low iron and iron sat  started  her on iron tablets   Diabetes mellitus  Hb A1C of 7.1  Does not want to start any meds and will discuss with PCP  Depression  stable . Cont home medication  Carpel tunnel syndrome of right hand  recent surgery done  stitches removed and stable    Counseled on weight loss, dietary adherence and protected sexual activity   Time spent on Discharge: 45 minutes  Signed: Eddie North 09/10/2011, 3:43 PM

## 2011-09-13 ENCOUNTER — Other Ambulatory Visit (INDEPENDENT_AMBULATORY_CARE_PROVIDER_SITE_OTHER): Payer: Self-pay | Admitting: General Surgery

## 2011-09-15 ENCOUNTER — Other Ambulatory Visit (INDEPENDENT_AMBULATORY_CARE_PROVIDER_SITE_OTHER): Payer: Self-pay | Admitting: General Surgery

## 2011-09-16 ENCOUNTER — Other Ambulatory Visit (INDEPENDENT_AMBULATORY_CARE_PROVIDER_SITE_OTHER): Payer: Self-pay | Admitting: General Surgery

## 2011-09-16 ENCOUNTER — Telehealth (INDEPENDENT_AMBULATORY_CARE_PROVIDER_SITE_OTHER): Payer: Self-pay | Admitting: General Surgery

## 2011-09-16 DIAGNOSIS — K5792 Diverticulitis of intestine, part unspecified, without perforation or abscess without bleeding: Secondary | ICD-10-CM

## 2011-09-16 NOTE — Telephone Encounter (Signed)
Called patient to advise appointment made with [facility] @ [facility] medical center location 09/26/11. Also confirmed she has appointment with Dr. Magnus Ivan on 10/03/11. Advised patient she has to go to [facility] to pick up prep kit needed for test that Thursday or Friday before, they are not open on Saturday.

## 2011-09-26 ENCOUNTER — Ambulatory Visit
Admission: RE | Admit: 2011-09-26 | Discharge: 2011-09-26 | Disposition: A | Discharge status: Home / Self Care | Payer: Medicaid Other | Source: Ambulatory Visit | Attending: Surgery | Admitting: Surgery

## 2011-09-26 DIAGNOSIS — K5792 Diverticulitis of intestine, part unspecified, without perforation or abscess without bleeding: Secondary | ICD-10-CM

## 2011-10-03 ENCOUNTER — Ambulatory Visit (INDEPENDENT_AMBULATORY_CARE_PROVIDER_SITE_OTHER): Payer: Medicaid Other | Admitting: Surgery

## 2011-10-03 ENCOUNTER — Encounter (INDEPENDENT_AMBULATORY_CARE_PROVIDER_SITE_OTHER): Payer: Self-pay | Admitting: Surgery

## 2011-10-03 VITALS — BP 120/82 | HR 90 | Temp 98.2°F | Resp 18 | Ht 62.75 in | Wt 255.0 lb

## 2011-10-03 DIAGNOSIS — K5732 Diverticulitis of large intestine without perforation or abscess without bleeding: Secondary | ICD-10-CM

## 2011-10-03 NOTE — Progress Notes (Signed)
Patient ID: Kara [name], female   DOB: 1980-08-20, 31 y.o.   MRN: [id number]  Chief Complaint  Patient presents with  . Diverticulitis    HPI Kara [name] is a 31 y.o. female.  She is here for a hospital followup after a recent admission for diverticulitis. She apparently has had multiple bouts of last year. She was seen by our PAs and Dr. Daphine Deutscher while in the hospital. I am seeing her as Dr. Daphine Deutscher scheduled with full for the next several weeks. She reports she is feeling better. She has been off antibiotics for several weeks. She will still have occasional constipation and left lower quadrant pain. Again, she has had multiple admissions for diverticulitis HPI  Past Medical History  Diagnosis Date  . Depression   . Anxiety   . Anemia     no current med.  . Diverticular disease of colon   . 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 Vaginal cyst 08/2011    current antibiotic, will finish 08/31/2011  . Hypertension     under control, has been on med. > 4 yrs.    Past Surgical History  Procedure Date  . Cesarean section 2004, 01/30/2006  . Appendectomy   . Tubal ligation 07/28/2008    laparoscopic  . Carpal tunnel release 08/23/2011    Procedure: CARPAL TUNNEL RELEASE;  Surgeon: Tami Ribas, MD;  Location: [facility];  Service: Orthopedics;  Laterality: Right;    History reviewed. No pertinent family history.  Social History History  Substance Use Topics  . Smoking status: Former Games developer  . Smokeless tobacco: Never Used   Comment: can't remember when she quit smoking  . Alcohol Use: No    Allergies  Allergen Reactions  . Citrus Swelling    SWELLING OF TONGUE; BUMPS ON TONGUE  . Pepperoni (Pickled Meat) Swelling    SWELLING OF TONGUE; BUMPS ON TONGUE  . Pineapple Swelling    SWELLING OF TONGUE; BUMPS ON TONGUE  . Banana Diarrhea  . Betadine (Povidone Iodine) Hives  . Codeine Nausea And Vomiting    ONLY THE PO LIQUID FORM  . Sulfa Antibiotics  Rash    Current Outpatient Prescriptions  Medication Sig Dispense Refill  . desvenlafaxine (PRISTIQ) 100 MG 24 hr tablet Take 100 mg by mouth every morning.       . ferrous sulfate 325 (65 FE) MG tablet Take 1 tablet (325 mg total) by mouth 2 (two) times daily with a meal.  60 tablet  2  . hydrOXYzine (ATARAX/VISTARIL) 25 MG tablet Take 25 mg by mouth 2 (two) times daily.      Marland Kitchen lisinopril-hydrochlorothiazide (PRINZIDE,ZESTORETIC) 10-12.5 MG per tablet Take 1 tablet by mouth every morning.         Review of Systems Review of Systems  Constitutional: Negative for fever, chills and unexpected weight change.  HENT: Negative for hearing loss, congestion, sore throat, trouble swallowing and voice change.   Eyes: Negative for visual disturbance.  Respiratory: Negative for cough and wheezing.   Cardiovascular: Negative for chest pain, palpitations and leg swelling.  Gastrointestinal: Negative for nausea, vomiting, abdominal pain, diarrhea, constipation, blood in stool, abdominal distention and anal bleeding.  Genitourinary: Negative for hematuria, vaginal bleeding and difficulty urinating.  Musculoskeletal: Negative for arthralgias.  Skin: Negative for rash and wound.  Neurological: Negative for seizures, syncope and headaches.  Hematological: Negative for adenopathy. Does not bruise/bleed easily.  Psychiatric/Behavioral: Negative for confusion.  All other systems reviewed and  are negative.    Blood pressure 120/82, pulse 90, temperature 98.2 F (36.8 C), temperature source Temporal, resp. rate 18, height 5' 2.75" (1.594 m), weight 255 lb (115.667 kg), last menstrual period 09/21/2011.  Physical Exam Physical Exam  Constitutional: She is oriented to person, place, and time. She appears well-developed and well-nourished. No distress.  HENT:  Head: Normocephalic and atraumatic.  Right Ear: External ear normal.  Left Ear: External ear normal.  Nose: Nose normal.  Mouth/Throat:  Oropharynx is clear and moist. No oropharyngeal exudate.  Eyes: Conjunctivae are normal. Pupils are equal, round, and reactive to light. Right eye exhibits no discharge. Left eye exhibits no discharge. No scleral icterus.  Neck: Normal range of motion. Neck supple. No tracheal deviation present. No thyromegaly present.  Cardiovascular: Normal rate, regular rhythm, normal heart sounds and intact distal pulses.   Pulmonary/Chest: Effort normal and breath sounds normal. No respiratory distress. She has no wheezes. She has no rales.  Abdominal: Soft. Bowel sounds are normal. She exhibits no distension. There is tenderness. There is no rebound.       There is very mild tenderness with slight guarding in the left lower quadrant  Musculoskeletal: Normal range of motion. She exhibits no edema and no tenderness.  Lymphadenopathy:    She has no cervical adenopathy.  Neurological: She is alert and oriented to person, place, and time.  Skin: Skin is warm and dry. No rash noted. She is not diaphoretic. No erythema.  Psychiatric: Her behavior is normal. Judgment normal.    Data Reviewed The patient has a recent barium enema which shows multiple diverticuli through the descending colon to the rectum. There are no masses or strictures or polyps  Assessment    Recurrent sigmoid diverticulitis    Plan    She is very eager to proceed with a partial colectomy which I feel is reasonable given her multiple attacks of past year. I discussed this with her in detail. I would have this with a laparoscopic assisted approach. I discussed the risks of surgery which includes but is not limited to bleeding, infection, injury to surrounding structures, anastomotic leak, postop recovery, need for further surgery, et Karie Soda. She understands and wishes to proceed. Surgery will be scheduled       [name] A 10/03/2011, 4:19 PM

## 2011-10-18 ENCOUNTER — Encounter (HOSPITAL_COMMUNITY)
Admission: RE | Admit: 2011-10-18 | Discharge: 2011-10-18 | Disposition: A | Discharge status: Home / Self Care | Payer: Medicaid Other | Source: Ambulatory Visit | Attending: Anesthesiology | Admitting: Anesthesiology

## 2011-10-18 ENCOUNTER — Encounter (HOSPITAL_COMMUNITY): Payer: Self-pay

## 2011-10-18 ENCOUNTER — Encounter (HOSPITAL_COMMUNITY)
Admission: RE | Admit: 2011-10-18 | Discharge: 2011-10-18 | Disposition: A | Discharge status: Home / Self Care | Payer: Medicaid Other | Source: Ambulatory Visit | Attending: Surgery | Admitting: Surgery

## 2011-10-18 HISTORY — DX: Family history of other specified conditions: Z84.89

## 2011-10-18 LAB — BASIC METABOLIC PANEL
CO2: 24 mEq/L (ref 19–32)
Calcium: 9.2 mg/dL (ref 8.4–10.5)
Creatinine, Ser: 0.67 mg/dL (ref 0.50–1.10)
Glucose, Bld: 159 mg/dL — ABNORMAL HIGH (ref 70–99)

## 2011-10-18 LAB — ABO/RH: ABO/RH(D): AB POS

## 2011-10-18 LAB — CBC
MCH: 26.1 pg (ref 26.0–34.0)
MCV: 79.6 fL (ref 78.0–100.0)
Platelets: 340 10*3/uL (ref 150–400)
RDW: 16.5 % — ABNORMAL HIGH (ref 11.5–15.5)

## 2011-10-18 NOTE — Progress Notes (Signed)
Pt has not arrived for PAT appointment, left message on home phone with request to return call to reschedule.

## 2011-10-18 NOTE — Pre-Procedure Instructions (Signed)
18 Kara [name]  10/18/2011   Your procedure is scheduled on:  Wednesday July 10  Report to [facility] at 8:00 AM.  Call this number if you have problems the morning of surgery: [phone]   Remember:   Do not eat food:After Midnight.  May have clear liquids:until Midnight .  Clear liquids include soda, tea, black coffee, apple or grape juice, broth.  Take these medicines the morning of surgery with A SIP OF WATER: Pristiq, Vistaril (hydroxyzine), Percocet pain pill  Stop aspirin on 7/5   Do not wear jewelry, make-up or nail polish.  Do not wear lotions, powders, or perfumes. You may wear deodorant.  Do not shave 48 hours prior to surgery. Men may shave face and neck.  Do not bring valuables to the hospital.  Contacts, dentures or bridgework may not be worn into surgery.  Leave suitcase in the car. After surgery it may be brought to your room.  For patients admitted to the hospital, checkout time is 11:00 AM the day of discharge.   Patients discharged the day of surgery will not be allowed to drive home.  Name and phone number of your driver: NA  Special Instructions: CHG Shower Use Special Wash: 1/2 bottle night before surgery and 1/2 bottle morning of surgery.   Please read over the following fact sheets that you were given: Pain Booklet, Coughing and Deep Breathing, Blood Transfusion Information and Surgical Site Infection Prevention

## 2011-10-25 MED ORDER — SODIUM CHLORIDE 0.9 % IV SOLN
1.0000 g | INTRAVENOUS | Status: AC
  Administered 2011-10-26: 1 g via INTRAVENOUS
  Filled 2011-10-25: qty 1

## 2011-10-26 ENCOUNTER — Ambulatory Visit (HOSPITAL_COMMUNITY): Payer: Medicaid Other | Admitting: Certified Registered Nurse Anesthetist

## 2011-10-26 ENCOUNTER — Encounter (HOSPITAL_COMMUNITY): Payer: Self-pay | Admitting: Certified Registered Nurse Anesthetist

## 2011-10-26 ENCOUNTER — Encounter (HOSPITAL_COMMUNITY): Payer: Self-pay | Admitting: *Deleted

## 2011-10-26 ENCOUNTER — Inpatient Hospital Stay (HOSPITAL_COMMUNITY)
Admission: RE | Admit: 2011-10-26 | Discharge: 2011-11-09 | DRG: 330 | Disposition: A | Discharge status: Home Health | Payer: Medicaid Other | Source: Ambulatory Visit | Attending: Surgery | Admitting: Surgery

## 2011-10-26 ENCOUNTER — Encounter (HOSPITAL_COMMUNITY): Admission: RE | Disposition: A | Discharge status: Home Health | Payer: Self-pay | Source: Ambulatory Visit

## 2011-10-26 DIAGNOSIS — F3289 Other specified depressive episodes: Secondary | ICD-10-CM | POA: Diagnosis present

## 2011-10-26 DIAGNOSIS — N805 Endometriosis of intestine, unspecified: Secondary | ICD-10-CM | POA: Diagnosis present

## 2011-10-26 DIAGNOSIS — Z885 Allergy status to narcotic agent status: Secondary | ICD-10-CM

## 2011-10-26 DIAGNOSIS — Z9089 Acquired absence of other organs: Secondary | ICD-10-CM

## 2011-10-26 DIAGNOSIS — K5732 Diverticulitis of large intestine without perforation or abscess without bleeding: Principal | ICD-10-CM | POA: Diagnosis present

## 2011-10-26 DIAGNOSIS — F411 Generalized anxiety disorder: Secondary | ICD-10-CM | POA: Diagnosis present

## 2011-10-26 DIAGNOSIS — E119 Type 2 diabetes mellitus without complications: Secondary | ICD-10-CM | POA: Diagnosis present

## 2011-10-26 DIAGNOSIS — F329 Major depressive disorder, single episode, unspecified: Secondary | ICD-10-CM | POA: Diagnosis present

## 2011-10-26 DIAGNOSIS — T8140XA Infection following a procedure, unspecified, initial encounter: Secondary | ICD-10-CM | POA: Diagnosis not present

## 2011-10-26 DIAGNOSIS — Z882 Allergy status to sulfonamides status: Secondary | ICD-10-CM

## 2011-10-26 DIAGNOSIS — Z6841 Body Mass Index (BMI) 40.0 and over, adult: Secondary | ICD-10-CM

## 2011-10-26 DIAGNOSIS — Y832 Surgical operation with anastomosis, bypass or graft as the cause of abnormal reaction of the patient, or of later complication, without mention of misadventure at the time of the procedure: Secondary | ICD-10-CM | POA: Diagnosis not present

## 2011-10-26 DIAGNOSIS — Z87891 Personal history of nicotine dependence: Secondary | ICD-10-CM

## 2011-10-26 DIAGNOSIS — G5600 Carpal tunnel syndrome, unspecified upper limb: Secondary | ICD-10-CM | POA: Diagnosis present

## 2011-10-26 DIAGNOSIS — D509 Iron deficiency anemia, unspecified: Secondary | ICD-10-CM | POA: Diagnosis present

## 2011-10-26 DIAGNOSIS — I10 Essential (primary) hypertension: Secondary | ICD-10-CM | POA: Diagnosis present

## 2011-10-26 DIAGNOSIS — N80559 Endometriosis of other parts of the colon, unspecified depth: Secondary | ICD-10-CM | POA: Diagnosis present

## 2011-10-26 DIAGNOSIS — K5730 Diverticulosis of large intestine without perforation or abscess without bleeding: Secondary | ICD-10-CM

## 2011-10-26 HISTORY — PX: COLON SURGERY: SHX602

## 2011-10-26 SURGERY — LAPAROSCOPIC PARTIAL COLECTOMY
Anesthesia: General | Site: Abdomen | Wound class: Clean Contaminated

## 2011-10-26 MED ORDER — VENLAFAXINE HCL ER 75 MG PO CP24
75.0000 mg | ORAL_CAPSULE | Freq: Every day | ORAL | Status: DC
  Administered 2011-10-27 – 2011-11-09 (×14): 75 mg via ORAL
  Filled 2011-10-26 (×18): qty 1

## 2011-10-26 MED ORDER — ONDANSETRON HCL 4 MG PO TABS: 4.0000 mg | ORAL_TABLET | Freq: Four times a day (QID) | ORAL | Status: DC | PRN

## 2011-10-26 MED ORDER — LIDOCAINE HCL (CARDIAC) 20 MG/ML IV SOLN
INTRAVENOUS | Status: DC | PRN
  Administered 2011-10-26: 100 mg via INTRAVENOUS

## 2011-10-26 MED ORDER — HYDROCHLOROTHIAZIDE 12.5 MG PO CAPS
12.5000 mg | ORAL_CAPSULE | Freq: Every day | ORAL | Status: DC
  Administered 2011-10-26 – 2011-11-09 (×14): 12.5 mg via ORAL
  Filled 2011-10-26 (×15): qty 1

## 2011-10-26 MED ORDER — HYDROMORPHONE HCL PF 1 MG/ML IJ SOLN
INTRAMUSCULAR | Status: AC
  Filled 2011-10-26: qty 1

## 2011-10-26 MED ORDER — VECURONIUM BROMIDE 10 MG IV SOLR
INTRAVENOUS | Status: DC | PRN
  Administered 2011-10-26: 2 mg via INTRAVENOUS
  Administered 2011-10-26 (×4): 1 mg via INTRAVENOUS

## 2011-10-26 MED ORDER — ONDANSETRON HCL 4 MG/2ML IJ SOLN
4.0000 mg | Freq: Four times a day (QID) | INTRAMUSCULAR | Status: DC | PRN
  Filled 2011-10-26: qty 2

## 2011-10-26 MED ORDER — SODIUM CHLORIDE 0.9 % IV SOLN
1.0000 g | INTRAVENOUS | Status: AC
  Administered 2011-10-26: 1 g via INTRAVENOUS
  Filled 2011-10-26 (×2): qty 1

## 2011-10-26 MED ORDER — GLYCOPYRROLATE 0.2 MG/ML IJ SOLN
INTRAMUSCULAR | Status: DC | PRN
  Administered 2011-10-26: .6 mg via INTRAVENOUS

## 2011-10-26 MED ORDER — NALOXONE HCL 0.4 MG/ML IJ SOLN: 0.4000 mg | INTRAMUSCULAR | Status: DC | PRN

## 2011-10-26 MED ORDER — HYDROMORPHONE HCL PF 1 MG/ML IJ SOLN
0.2500 mg | INTRAMUSCULAR | Status: DC | PRN
  Administered 2011-10-26 (×2): 0.5 mg via INTRAVENOUS

## 2011-10-26 MED ORDER — ENOXAPARIN SODIUM 40 MG/0.4ML ~~LOC~~ SOLN
40.0000 mg | SUBCUTANEOUS | Status: DC
  Administered 2011-10-27 – 2011-11-09 (×14): 40 mg via SUBCUTANEOUS
  Filled 2011-10-26 (×14): qty 0.4

## 2011-10-26 MED ORDER — CHLORHEXIDINE GLUCONATE 0.12 % MT SOLN
15.0000 mL | Freq: Two times a day (BID) | OROMUCOSAL | Status: DC
  Administered 2011-10-26 – 2011-10-29 (×6): 15 mL via OROMUCOSAL
  Filled 2011-10-26 (×5): qty 15

## 2011-10-26 MED ORDER — PROPOFOL 10 MG/ML IV EMUL
INTRAVENOUS | Status: DC | PRN
  Administered 2011-10-26: 200 mg via INTRAVENOUS

## 2011-10-26 MED ORDER — ROCURONIUM BROMIDE 100 MG/10ML IV SOLN
INTRAVENOUS | Status: DC | PRN
  Administered 2011-10-26: 50 mg via INTRAVENOUS

## 2011-10-26 MED ORDER — ONDANSETRON HCL 4 MG/2ML IJ SOLN: 4.0000 mg | Freq: Four times a day (QID) | INTRAMUSCULAR | Status: DC | PRN

## 2011-10-26 MED ORDER — NEOSTIGMINE METHYLSULFATE 1 MG/ML IJ SOLN
INTRAMUSCULAR | Status: DC | PRN
  Administered 2011-10-26: 4 mg via INTRAVENOUS

## 2011-10-26 MED ORDER — ALVIMOPAN 12 MG PO CAPS
12.0000 mg | ORAL_CAPSULE | Freq: Once | ORAL | Status: AC
  Administered 2011-10-26: 12 mg via ORAL
  Filled 2011-10-26: qty 1

## 2011-10-26 MED ORDER — HYDROMORPHONE 0.3 MG/ML IV SOLN
INTRAVENOUS | Status: DC
  Administered 2011-10-26: 2.4 mg via INTRAVENOUS
  Administered 2011-10-26: 13:00:00 via INTRAVENOUS
  Administered 2011-10-27: 2.4 mg via INTRAVENOUS
  Administered 2011-10-27: 1.8 mg via INTRAVENOUS
  Administered 2011-10-27: 3 mg via INTRAVENOUS
  Administered 2011-10-27: 2.63 mg via INTRAVENOUS
  Administered 2011-10-27: 1.8 mg via INTRAVENOUS
  Administered 2011-10-27: via INTRAVENOUS
  Administered 2011-10-27: 2.1 mg via INTRAVENOUS
  Administered 2011-10-28: 3.27 mg via INTRAVENOUS
  Administered 2011-10-28: 3.9 mg via INTRAVENOUS
  Administered 2011-10-28: 11:00:00 via INTRAVENOUS
  Administered 2011-10-28: 3 mg via INTRAVENOUS
  Administered 2011-10-28: 5.4 mg via INTRAVENOUS
  Administered 2011-10-28: 22:00:00 via INTRAVENOUS
  Administered 2011-10-29: 3.3 mg via INTRAVENOUS
  Administered 2011-10-29: 1.8 mg via INTRAVENOUS
  Administered 2011-10-29 (×2): via INTRAVENOUS
  Administered 2011-10-29: 2.1 mg via INTRAVENOUS
  Administered 2011-10-29: 2.4 mg via INTRAVENOUS
  Administered 2011-10-30: 1.8 mg via INTRAVENOUS
  Administered 2011-10-30: 0.6 mg via INTRAVENOUS
  Administered 2011-10-30: 1.8 mg via INTRAVENOUS
  Filled 2011-10-26 (×7): qty 25

## 2011-10-26 MED ORDER — LISINOPRIL-HYDROCHLOROTHIAZIDE 10-12.5 MG PO TABS: 1.0000 | ORAL_TABLET | Freq: Every morning | ORAL | Status: DC

## 2011-10-26 MED ORDER — BUPIVACAINE-EPINEPHRINE PF 0.25-1:200000 % IJ SOLN
INTRAMUSCULAR | Status: AC
  Filled 2011-10-26: qty 30

## 2011-10-26 MED ORDER — FENTANYL CITRATE 0.05 MG/ML IJ SOLN
INTRAMUSCULAR | Status: DC | PRN
  Administered 2011-10-26: 150 ug via INTRAVENOUS
  Administered 2011-10-26 (×3): 50 ug via INTRAVENOUS
  Administered 2011-10-26: 100 ug via INTRAVENOUS
  Administered 2011-10-26 (×2): 50 ug via INTRAVENOUS

## 2011-10-26 MED ORDER — BIOTENE DRY MOUTH MT LIQD
15.0000 mL | Freq: Two times a day (BID) | OROMUCOSAL | Status: DC
  Administered 2011-10-27 – 2011-10-28 (×4): 15 mL via OROMUCOSAL

## 2011-10-26 MED ORDER — ONDANSETRON HCL 4 MG/2ML IJ SOLN: 4.0000 mg | Freq: Once | INTRAMUSCULAR | Status: DC | PRN

## 2011-10-26 MED ORDER — HEMOSTATIC AGENTS (NO CHARGE) OPTIME
TOPICAL | Status: DC | PRN
  Administered 2011-10-26: 1 via TOPICAL

## 2011-10-26 MED ORDER — MIDAZOLAM HCL 5 MG/5ML IJ SOLN
INTRAMUSCULAR | Status: DC | PRN
  Administered 2011-10-26: 2 mg via INTRAVENOUS

## 2011-10-26 MED ORDER — SODIUM CHLORIDE 0.9 % IJ SOLN: 9.0000 mL | INTRAMUSCULAR | Status: DC | PRN

## 2011-10-26 MED ORDER — ALVIMOPAN 12 MG PO CAPS
12.0000 mg | ORAL_CAPSULE | Freq: Two times a day (BID) | ORAL | Status: DC
  Administered 2011-10-27 – 2011-10-28 (×4): 12 mg via ORAL
  Filled 2011-10-26 (×6): qty 1

## 2011-10-26 MED ORDER — HYDROXYZINE HCL 25 MG PO TABS
25.0000 mg | ORAL_TABLET | Freq: Two times a day (BID) | ORAL | Status: DC
  Administered 2011-10-26 – 2011-11-09 (×27): 25 mg via ORAL
  Filled 2011-10-26 (×29): qty 1

## 2011-10-26 MED ORDER — LISINOPRIL 10 MG PO TABS
10.0000 mg | ORAL_TABLET | Freq: Every day | ORAL | Status: DC
  Administered 2011-10-26 – 2011-11-09 (×15): 10 mg via ORAL
  Filled 2011-10-26 (×15): qty 1

## 2011-10-26 MED ORDER — ONDANSETRON HCL 4 MG/2ML IJ SOLN
INTRAMUSCULAR | Status: DC | PRN
  Administered 2011-10-26: 4 mg via INTRAVENOUS

## 2011-10-26 MED ORDER — POTASSIUM CHLORIDE IN NACL 20-0.9 MEQ/L-% IV SOLN
INTRAVENOUS | Status: DC
  Administered 2011-10-26: 17:00:00 via INTRAVENOUS
  Administered 2011-10-27 (×2): 1000 mL via INTRAVENOUS
  Administered 2011-10-27 – 2011-10-28 (×2): via INTRAVENOUS
  Administered 2011-10-28: 1000 mL via INTRAVENOUS
  Administered 2011-10-28 – 2011-10-30 (×3): via INTRAVENOUS
  Filled 2011-10-26 (×15): qty 1000

## 2011-10-26 MED ORDER — SODIUM CHLORIDE 0.9 % IR SOLN
Status: DC | PRN
  Administered 2011-10-26: 3000 mL

## 2011-10-26 MED ORDER — DEXAMETHASONE SODIUM PHOSPHATE 4 MG/ML IJ SOLN
INTRAMUSCULAR | Status: DC | PRN
  Administered 2011-10-26: 8 mg via INTRAVENOUS

## 2011-10-26 MED ORDER — DIPHENHYDRAMINE HCL 12.5 MG/5ML PO ELIX
12.5000 mg | ORAL_SOLUTION | Freq: Four times a day (QID) | ORAL | Status: DC | PRN
  Administered 2011-10-27: 12.5 mg via ORAL
  Filled 2011-10-26: qty 5

## 2011-10-26 MED ORDER — DIPHENHYDRAMINE HCL 50 MG/ML IJ SOLN
12.5000 mg | Freq: Four times a day (QID) | INTRAMUSCULAR | Status: DC | PRN
  Administered 2011-10-26 – 2011-10-30 (×10): 12.5 mg via INTRAVENOUS
  Filled 2011-10-26 (×10): qty 1

## 2011-10-26 MED ORDER — HYDROMORPHONE 0.3 MG/ML IV SOLN
INTRAVENOUS | Status: AC
  Filled 2011-10-26: qty 25

## 2011-10-26 MED ORDER — LACTATED RINGERS IV SOLN
INTRAVENOUS | Status: DC | PRN
  Administered 2011-10-26 (×2): via INTRAVENOUS

## 2011-10-26 MED ORDER — LABETALOL HCL 5 MG/ML IV SOLN
INTRAVENOUS | Status: DC | PRN
  Administered 2011-10-26: 2 mg via INTRAVENOUS
  Administered 2011-10-26: 8 mg via INTRAVENOUS
  Administered 2011-10-26 (×3): 2 mg via INTRAVENOUS
  Administered 2011-10-26: 1 mg via INTRAVENOUS
  Administered 2011-10-26 (×2): 2 mg via INTRAVENOUS
  Administered 2011-10-26: 3 mg via INTRAVENOUS
  Administered 2011-10-26 (×3): 2 mg via INTRAVENOUS
  Administered 2011-10-26 (×3): 1 mg via INTRAVENOUS
  Administered 2011-10-26: 3 mg via INTRAVENOUS
  Administered 2011-10-26 (×2): 2 mg via INTRAVENOUS

## 2011-10-26 SURGICAL SUPPLY — 74 items
ADH SKN CLS APL DERMABOND .7 (GAUZE/BANDAGES/DRESSINGS) ×2
APPLIER CLIP ROT 10 11.4 M/L (STAPLE)
APR CLP MED LRG 11.4X10 (STAPLE)
BLADE SURG 10 STRL SS (BLADE) ×2 IMPLANT
BLADE SURG ROTATE 9660 (MISCELLANEOUS) IMPLANT
CANISTER SUCTION 2500CC (MISCELLANEOUS) ×2 IMPLANT
CELLS DAT CNTRL 66122 CELL SVR (MISCELLANEOUS) ×1 IMPLANT
CHLORAPREP W/TINT 26ML (MISCELLANEOUS) ×2 IMPLANT
CLIP APPLIE ROT 10 11.4 M/L (STAPLE) IMPLANT
COVER SURGICAL LIGHT HANDLE (MISCELLANEOUS) ×2 IMPLANT
DECANTER SPIKE VIAL GLASS SM (MISCELLANEOUS) ×2 IMPLANT
DERMABOND ADVANCED (GAUZE/BANDAGES/DRESSINGS) ×2
DERMABOND ADVANCED .7 DNX12 (GAUZE/BANDAGES/DRESSINGS) IMPLANT
DRAIN CHANNEL 19F RND (DRAIN) ×1 IMPLANT
DRAPE PROXIMA HALF (DRAPES) ×1 IMPLANT
DRAPE WARM FLUID 44X44 (DRAPE) ×2 IMPLANT
ELECT BLADE 6.5 EXT (BLADE) ×1 IMPLANT
ELECT CAUTERY BLADE 6.4 (BLADE) ×2 IMPLANT
ELECT REM PT RETURN 9FT ADLT (ELECTROSURGICAL) ×2
ELECTRODE REM PT RTRN 9FT ADLT (ELECTROSURGICAL) ×1 IMPLANT
EVACUATOR SILICONE 100CC (DRAIN) ×1 IMPLANT
GEL ULTRASOUND 20GR AQUASONIC (MISCELLANEOUS) IMPLANT
GLOVE BIOGEL PI IND STRL 6.5 (GLOVE) IMPLANT
GLOVE BIOGEL PI IND STRL 7.0 (GLOVE) IMPLANT
GLOVE BIOGEL PI INDICATOR 6.5 (GLOVE) ×1
GLOVE BIOGEL PI INDICATOR 7.0 (GLOVE) ×3
GLOVE SURG SIGNA 7.5 PF LTX (GLOVE) ×6 IMPLANT
GLOVE SURG SS PI 6.5 STRL IVOR (GLOVE) ×1 IMPLANT
GLOVE SURG SS PI 7.0 STRL IVOR (GLOVE) ×3 IMPLANT
GOWN PREVENTION PLUS XLARGE (GOWN DISPOSABLE) ×3 IMPLANT
GOWN STRL NON-REIN LRG LVL3 (GOWN DISPOSABLE) ×6 IMPLANT
HEMOSTAT SNOW SURGICEL 2X4 (HEMOSTASIS) ×1 IMPLANT
KIT BASIN OR (CUSTOM PROCEDURE TRAY) ×2 IMPLANT
LEGGING LITHOTOMY PAIR STRL (DRAPES) ×1 IMPLANT
NS IRRIG 1000ML POUR BTL (IV SOLUTION) ×8 IMPLANT
PAD ARMBOARD 7.5X6 YLW CONV (MISCELLANEOUS) ×4 IMPLANT
PENCIL BUTTON HOLSTER BLD 10FT (ELECTRODE) ×2 IMPLANT
RETRACTOR WND ALEXIS 18 MED (MISCELLANEOUS) IMPLANT
RTRCTR WOUND ALEXIS 18CM MED (MISCELLANEOUS) ×2
SCALPEL HARMONIC ACE (MISCELLANEOUS) ×2 IMPLANT
SCISSORS LAP 5X35 DISP (ENDOMECHANICALS) IMPLANT
SEALER TISSUE X1 CVD JAW (INSTRUMENTS) ×1 IMPLANT
SET IRRIG TUBING LAPAROSCOPIC (IRRIGATION / IRRIGATOR) ×2 IMPLANT
SLEEVE Z-THREAD 5X100MM (TROCAR) ×6 IMPLANT
SPECIMEN JAR LARGE (MISCELLANEOUS) ×2 IMPLANT
SPONGE GAUZE 4X4 12PLY (GAUZE/BANDAGES/DRESSINGS) ×2 IMPLANT
SPONGE LAP 18X18 X RAY DECT (DISPOSABLE) ×1 IMPLANT
STAPLER CIRC CVD 29MM 37CM (STAPLE) ×1 IMPLANT
STAPLER CUT CVD 40MM BLUE (STAPLE) ×1 IMPLANT
STAPLER PROXIMATE 75MM BLUE (STAPLE) ×1 IMPLANT
STAPLER VISISTAT 35W (STAPLE) ×2 IMPLANT
SURGILUBE 2OZ TUBE FLIPTOP (MISCELLANEOUS) ×2 IMPLANT
SUT ETHILON 2 0 FS 18 (SUTURE) ×1 IMPLANT
SUT PDS AB 1 TP1 96 (SUTURE) ×4 IMPLANT
SUT PROLENE 2 0 CT2 30 (SUTURE) ×1 IMPLANT
SUT PROLENE 2 0 KS (SUTURE) IMPLANT
SUT SILK 2 0 SH CR/8 (SUTURE) ×2 IMPLANT
SUT SILK 2 0 TIES 10X30 (SUTURE) ×2 IMPLANT
SUT SILK 3 0 SH CR/8 (SUTURE) ×2 IMPLANT
SUT SILK 3 0 TIES 10X30 (SUTURE) ×2 IMPLANT
SYS LAPSCP GELPORT 120MM (MISCELLANEOUS)
SYSTEM LAPSCP GELPORT 120MM (MISCELLANEOUS) IMPLANT
TAPE CLOTH SURG 6X10 WHT LF (GAUZE/BANDAGES/DRESSINGS) ×1 IMPLANT
TOWEL OR 17X24 6PK STRL BLUE (TOWEL DISPOSABLE) ×2 IMPLANT
TOWEL OR 17X26 10 PK STRL BLUE (TOWEL DISPOSABLE) ×2 IMPLANT
TRAY FOLEY CATH 14FRSI W/METER (CATHETERS) ×2 IMPLANT
TRAY LAPAROSCOPIC (CUSTOM PROCEDURE TRAY) ×2 IMPLANT
TRAY PROCTOSCOPIC FIBER OPTIC (SET/KITS/TRAYS/PACK) ×1 IMPLANT
TROCAR XCEL BLUNT TIP 100MML (ENDOMECHANICALS) ×1 IMPLANT
TROCAR Z-THREAD FIOS 5X100MM (TROCAR) ×2 IMPLANT
TUBE CONNECTING 12X1/4 (SUCTIONS) ×2 IMPLANT
TUBING FILTER THERMOFLATOR (ELECTROSURGICAL) ×2 IMPLANT
WATER STERILE IRR 1000ML POUR (IV SOLUTION) ×2 IMPLANT
YANKAUER SUCT BULB TIP NO VENT (SUCTIONS) ×2 IMPLANT

## 2011-10-26 NOTE — Anesthesia Postprocedure Evaluation (Signed)
  Anesthesia Post-op Note  Patient: Kara [name]  Procedure(s) Performed: Procedure(s) (LRB): LAPAROSCOPIC PARTIAL COLECTOMY (N/A)  Patient Location: PACU  Anesthesia Type: General  Level of Consciousness: awake  Airway and Oxygen Therapy: Patient Spontanous Breathing  Post-op Pain: mild  Post-op Assessment: Post-op Vital signs reviewed, Patient's Cardiovascular Status Stable, Respiratory Function Stable, Patent Airway, No signs of Nausea or vomiting and Pain level controlled  Post-op Vital Signs: stable  Complications: No apparent anesthesia complications

## 2011-10-26 NOTE — Anesthesia Preprocedure Evaluation (Addendum)
Anesthesia Evaluation  Patient identified by MRN, date of birth, ID band Patient awake    Reviewed: Allergy & Precautions, H&P , NPO status , Patient's Chart, lab work & pertinent test results  Airway Mallampati: I TM Distance: >3 FB Neck ROM: Full    Dental  (+) Teeth Intact and Dental Advisory Given   Pulmonary neg pulmonary ROS,  breath sounds clear to auscultation        Cardiovascular hypertension, Pt. on medications Rhythm:Regular Rate:Normal     Neuro/Psych Anxiety Depression Right wrist carpal tunnel  Neuromuscular disease    GI/Hepatic Neg liver ROS, Recurrent Diverticultiis   Endo/Other  Morbid obesity  Renal/GU negative Renal ROS     Musculoskeletal negative musculoskeletal ROS (+)   Abdominal   Peds  Hematology negative hematology ROS (+)   Anesthesia Other Findings   Reproductive/Obstetrics                          Anesthesia Physical Anesthesia Plan  ASA: III  Anesthesia Plan: General   Post-op Pain Management:    Induction: Intravenous  Airway Management Planned: Oral ETT  Additional Equipment:   Intra-op Plan:   Post-operative Plan: Extubation in OR  Informed Consent: I have reviewed the patients History and Physical, chart, labs and discussed the procedure including the risks, benefits and alternatives for the proposed anesthesia with the patient or authorized representative who has indicated his/her understanding and acceptance.   Dental advisory given  Plan Discussed with: CRNA, Anesthesiologist and Surgeon  Anesthesia Plan Comments:         Anesthesia Quick Evaluation

## 2011-10-26 NOTE — H&P (Signed)
Kara [name] is an 31 y.o. female.   Chief Complaint: recurrent sigmoid diverticulitis HPI: this is a pleasant 31 year old female who has had multiple bouts of sigmoid diverticulitis over the past year. She complains of intermittent sharp and crampy lower abdominal pain. She has been on antibiotics multiple times. Because of the continued recurrence, she wishes to proceed with a laparoscopic sigmoid colectomy. She is otherwise without complaints.  Past Medical History  Diagnosis Date  . Depression   . Anxiety   . Anemia     no current med.  . Diverticular disease of colon   . 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 Vaginal cyst 08/2011    current antibiotic, will finish 08/31/2011  . Hypertension     under control, has been on med. > 4 yrs.  . Family history of anesthesia complication     great aunt had hallucinations after IV med administered during anesthesia    Past Surgical History  Procedure Date  . Cesarean section 2004, 01/30/2006  . Appendectomy   . Tubal ligation 07/28/2008    laparoscopic  . Carpal tunnel release 08/23/2011    Procedure: CARPAL TUNNEL RELEASE;  Surgeon: Tami Ribas, MD;  Location: [facility];  Service: Orthopedics;  Laterality: Right;    No family history on file. Social History:  reports that she has quit smoking. She has never used smokeless tobacco. She reports that she does not drink alcohol or use illicit drugs.  Allergies:  Allergies  Allergen Reactions  . Citrus Swelling    SWELLING OF TONGUE; BUMPS ON TONGUE  . Pepperoni (Pickled Meat) Swelling    SWELLING OF TONGUE; BUMPS ON TONGUE  . Pineapple Swelling    SWELLING OF TONGUE; BUMPS ON TONGUE  . Banana Diarrhea  . Betadine (Povidone Iodine) Hives  . Codeine Nausea And Vomiting    ONLY THE PO LIQUID FORM  . Sulfa Antibiotics Rash    No prescriptions prior to admission    No results found for this or any previous visit (from the past 48 hour(s)). No results  found.  Review of Systems  All other systems reviewed and are negative.    There were no vitals taken for this visit. Physical Exam  Constitutional: She is oriented to person, place, and time. She appears well-developed and well-nourished. No distress.  HENT:  Head: Normocephalic and atraumatic.  Right Ear: External ear normal.  Left Ear: External ear normal.  Nose: Nose normal.  Mouth/Throat: Oropharynx is clear and moist.  Eyes: Conjunctivae are normal. Pupils are equal, round, and reactive to light.  Neck: Normal range of motion. Neck supple. No tracheal deviation present.  Cardiovascular: Normal rate, regular rhythm, normal heart sounds and intact distal pulses.   No murmur heard. Respiratory: Effort normal and breath sounds normal. No respiratory distress. She has no wheezes.  GI: Soft. Bowel sounds are normal. She exhibits no distension. There is no tenderness.  Musculoskeletal: Normal range of motion. She exhibits no edema and no tenderness.  Lymphadenopathy:    She has no cervical adenopathy.  Neurological: She is alert and oriented to person, place, and time.  Skin: Skin is warm and dry. No rash noted. She is not diaphoretic. No erythema.  Psychiatric: Her behavior is normal. Judgment normal.     Assessment/Plan Recurrent sigmoid diverticulitis  After a long discussion in the office, she wishes to proceed with definitive surgery. Laparoscopic assisted partial colectomy as planned. I discussed this with her in detail.  I discussed the risk of surgery which includes but is not limited to bleeding, infection, anastomotic leak, need for ostomy, need for further surgery, injury to stranding structures, cardiopulmonary issues, DVT, etc. She understands and wishes to proceed. Surgery is thus scheduled. Likelihood of success is good  Nakaiya Beddow A 10/26/2011, 7:54 AM

## 2011-10-26 NOTE — Preoperative (Signed)
Beta Blockers   Reason not to administer Beta Blockers:Not Applicable 

## 2011-10-26 NOTE — Op Note (Signed)
LAPAROSCOPIC PARTIAL COLECTOMY  Procedure Note  Kara [name] 10/26/2011   Pre-op Diagnosis: recurrent sigmoid diverticulitis     Post-op Diagnosis: same  Procedure(s): LAPAROSCOPIC ASSISTED PARTIAL COLECTOMY TAKEDOWN OF SPLENIC FLEXURE  Surgeon(s): Shelly Rubenstein, MD Ernestene Mention, MD  Anesthesia: General  Staff:  Sudie Bailey, RN - Circulator Mindy Midge Aver, CST - Scrub Person Leighton Parody - Scrub Person Kirstie Peri, RN - Scrub Person Ursula Beath, RN - Relief Circulator  Estimated Blood Loss: Minimal               Specimens: sigmoid colon         Indications: This is a 31 year old female that has had multiple bouts of sigmoid diverticulitis. The decision has been made to proceed with laparoscopic assisted partial colectomy  Procedure: The patient was brought to the operating room and identified as the correct patient. She was placed supine on the operating room table and general anesthesia was induced. The patient was then placed in lithotomy position. I made a small incision just below the umbilicus with a scalpel. I took this down to the fascia which was then opened with a scalpel. A hemostat was then used to pass into the peritoneal cavity under direct vision. A 0 Vicryl for skin suture was placed around the fascial opening. The [facility] port was placed through the opening and insufflation of the abdomen was begun.  I then placed a 5 mm port in the patient's lower midline and another above the umbilicus under direct vision. The sigmoid colon was identified I took down attachments to the lateral peritoneum the pelvic sidewall with the harmonic scalpel. I then mobilized the white line of Toldt along the harmonic scalpel as well. 2 more 5 mm ports were placed in the left upper quadrant and left mid quadrant to aid with the dissection. The splenic flexure was then slowly taken down with a harmonic scalpel as well. I was able to easily visualize the  ureter on the left side as I dissected down toward the pelvis. At this point, I appeared to have the colon mobilized. Having converted to an open procedure. I connected all port sites at the midline with an incision with the scalpel. I took this down to the subcutaneous tissue and fascia with electrocautery. I then opened the rest of peritoneum the entire length the incision. I had to go above the umbilicus as the patient had a  Hernia at the umbilicus. I also had to make the incision larger secondary to the patient's morbid obesity. I was unable to mobilize the colon further and again confirmed the position of the ureter which was kept out of the dissection. I then transected the proximal rectum with the contour stapler. The mesentery staying close to the colon with the electrocautery device. I then transected the colon proximal to the sigmoid colon with the GIA-75 stapler. The sigmoid colon specimen was then sent to pathology for evaluation. The proximal colon easily fit into the pelvis. I opened up the staple line with the cautery. I then placed a 2-0 Prolene pursestring suture around the opening of colon. I brought a 29 EEA stapler onto the field. I placed the anvil into the proximal colon and then tightened down the Prolene suture around this. I did remove some of the fat from around the end of the colon. The proximal end of the stapler was then brought up through the anus and manipulated toward the end of the rectal  stump. The stapling device is then opened bringing the end of the stapler just anterior to the staple line of the rectal stump. I then connected the anvil to the proximal end of the stapler. Stapling device was then slowly closed regular colon down to the rectum. The stapler was then fired bringing the colorectal anastomosis. The device was then removed and both rings were evaluated and found to be intact. Proctoscopy was then performed. I filled the pelvis with saline and insufflated the colon and  found no evidence of leak. The proximal tube was then removed and air was relieved from the colon. I then reinforced the staple line several areas with silk sutures. The anastomosis appeared pink and viable with no tension. I then thoroughly irrigated out with several liters normal saline. I placed a 62 [place] Blake drain into the pelvis and along the left pericolic gutter. This was sewn in place with a nylon suture. Also placed a piece of surgical Snow along the retroperitoneum on the left side to aid with hemostasis. Hemostasis could be achieved. I then closed the patient's midline fascia with a running #1 looped PDS suture. Irrigated the skin and closed all incisions with staples. The patient tolerated the procedure well. All the counts were correct at the end of the procedure. The patient was then estimated the operating room and taken in stable condition to the recovery room. Andrea Ferrer A   Date: 10/26/2011  Time: 11:51 AM

## 2011-10-26 NOTE — Progress Notes (Signed)
UR complete 

## 2011-10-26 NOTE — Transfer of Care (Signed)
Immediate Anesthesia Transfer of Care Note  Patient: Kara [name]  Procedure(s) Performed: Procedure(s) (LRB): LAPAROSCOPIC PARTIAL COLECTOMY (N/A)  Patient Location: PACU  Anesthesia Type: General  Level of Consciousness: awake and alert   Airway & Oxygen Therapy: Patient Spontanous Breathing and Patient connected to nasal cannula oxygen  Post-op Assessment: Report given to PACU RN and Post -op Vital signs reviewed and stable  Post vital signs: Reviewed and stable  Complications: No apparent anesthesia complications

## 2011-10-26 NOTE — Interval H&P Note (Signed)
History and Physical Interval Note:  10/26/2011 7:57 AM  Kara [name]  has presented today for surgery, with the diagnosis of recurrent diverticulitis  The various methods of treatment have been discussed with the patient and family. After consideration of risks, benefits and other options for treatment, the patient has consented to  Procedure(s) (LRB): LAPAROSCOPIC PARTIAL COLECTOMY (N/A) as a surgical intervention .  The patient's history has been reviewed, patient examined, no change in status, stable for surgery.  I have reviewed the patients' chart and labs.  Questions were answered to the patient's satisfaction.     Yonas Bunda A

## 2011-10-26 NOTE — Anesthesia Procedure Notes (Signed)
Procedure Name: Intubation Date/Time: 10/26/2011 9:21 AM Performed by: Margaree Mackintosh Pre-anesthesia Checklist: Patient identified, Timeout performed, Emergency Drugs available, Suction available and Patient being monitored Patient Re-evaluated:Patient Re-evaluated prior to inductionOxygen Delivery Method: Circle system utilized Preoxygenation: Pre-oxygenation with 100% oxygen Intubation Type: IV induction Ventilation: Mask ventilation without difficulty and Oral airway inserted - appropriate to patient size Laryngoscope Size: Mac and 3 Grade View: Grade I Tube type: Oral Tube size: 7.5 mm Number of attempts: 1 Airway Equipment and Method: Stylet Placement Confirmation: ETT inserted through vocal cords under direct vision,  positive ETCO2 and breath sounds checked- equal and bilateral Secured at: 21 cm Tube secured with: Tape Dental Injury: Teeth and Oropharynx as per pre-operative assessment

## 2011-10-27 LAB — CBC
HCT: 31.6 % — ABNORMAL LOW (ref 36.0–46.0)
Hemoglobin: 10.2 g/dL — ABNORMAL LOW (ref 12.0–15.0)
MCH: 26 pg (ref 26.0–34.0)
MCV: 80.4 fL (ref 78.0–100.0)
Platelets: 409 10*3/uL — ABNORMAL HIGH (ref 150–400)
RBC: 3.93 MIL/uL (ref 3.87–5.11)
WBC: 13.4 10*3/uL — ABNORMAL HIGH (ref 4.0–10.5)

## 2011-10-27 LAB — BASIC METABOLIC PANEL
CO2: 27 mEq/L (ref 19–32)
Chloride: 99 mEq/L (ref 96–112)
Glucose, Bld: 129 mg/dL — ABNORMAL HIGH (ref 70–99)
Sodium: 136 mEq/L (ref 135–145)

## 2011-10-27 MED ORDER — HYDROMORPHONE HCL PF 1 MG/ML IJ SOLN
INTRAMUSCULAR | Status: AC
  Administered 2011-10-27: 1 mg
  Filled 2011-10-27: qty 1

## 2011-10-27 MED ORDER — HYDROMORPHONE HCL PF 1 MG/ML IJ SOLN: 1.0000 mg | INTRAMUSCULAR | Status: DC | PRN

## 2011-10-27 NOTE — Progress Notes (Signed)
I have seen and examined the patient and agree with the assessment and plans. Left arm doing better after infliltration  Gerarda Conklin A. Magnus Ivan  MD, FACS

## 2011-10-27 NOTE — Progress Notes (Signed)
Patient ID: Kara [name], female   DOB: 10-17-1980, 31 y.o.   MRN: [id number] 1 Day Post-Op  Subjective: Pt c/o severe pain, IV was severely infiltrated last night and unsure if she was getting any pain relief all night.  Left forearm is significantly swollen and painful.    Objective: Vital signs in last 24 hours: Temp:  [97.9 F (36.6 C)-98.7 F (37.1 C)] 98 F (36.7 C) (07/11 0535) Pulse Rate:  [71-100] 71  (07/11 0535) Resp:  [14-20] 17  (07/11 0535) BP: (114-175)/(47-104) 115/47 mmHg (07/11 0535) SpO2:  [93 %-100 %] 100 % (07/11 0535) FiO2 (%):  [2 %] 2 % (07/10 1742) Weight:  [256 lb 11.2 oz (116.438 kg)] 256 lb 11.2 oz (116.438 kg) (07/10 1747)    Intake/Output from previous day: 07/10 0701 - 07/11 0700 In: 3686.5 [I.V.:3686.5] Out: 3750 [Urine:3375; Drains:200; Blood:175] Intake/Output this shift:    PE: Abd: soft, no bs, dressing with bloody drainage, drain with serosang output Ext: left forearm with significant infiltration from IV (IV removed now)  Lab Results:   Basename 10/27/11 0725  WBC 13.4*  HGB 10.2*  HCT 31.6*  PLT 409*   BMET No results found for this basename: NA:2,K:2,CL:2,CO2:2,GLUCOSE:2,BUN:2,CREATININE:2,CALCIUM:2 in the last 72 hours PT/INR No results found for this basename: LABPROT:2,INR:2 in the last 72 hours CMP     Component Value Date/Time   NA 135 10/18/2011 1125   K 3.7 10/18/2011 1125   CL 102 10/18/2011 1125   CO2 24 10/18/2011 1125   GLUCOSE 159* 10/18/2011 1125   BUN 10 10/18/2011 1125   CREATININE 0.67 10/18/2011 1125   CALCIUM 9.2 10/18/2011 1125   PROT 7.4 09/05/2011 1057   ALBUMIN 3.4* 09/05/2011 1057   AST 15 09/05/2011 1057   ALT 11 09/05/2011 1057   ALKPHOS 44 09/05/2011 1057   BILITOT 0.4 09/05/2011 1057   GFRNONAA >90 10/18/2011 1125   GFRAA >90 10/18/2011 1125   Lipase     Component Value Date/Time   LIPASE 23 09/05/2011 1057       Studies/Results: No results found.  Anti-infectives: Anti-infectives     Start      Dose/Rate Route Frequency Ordered Stop   10/26/11 1530   ertapenem (INVANZ) 1 g in sodium chloride 0.9 % 50 mL IVPB        1 g 100 mL/hr over 30 Minutes Intravenous Every 24 hours 10/26/11 1436 10/26/11 1802   10/25/11 1428   ertapenem (INVANZ) 1 g in sodium chloride 0.9 % 50 mL IVPB        1 g 100 mL/hr over 30 Minutes Intravenous 60 min pre-op 10/25/11 1428 10/26/11 0928           Assessment/Plan  1.  POD#1- Lap assisted partial colectomy and takedown of the splenic flexure:  Unfortunately IV infiltrated last night and pt is having severe pain.  Have order IM dilaudid for now, IV working on new site, restart PCA ASAP.  If needed we can place a central line if peripheral access can not be maintained.  Up out of bed once pain control better.     LOS: 1 day    Kara [name] 10/27/2011

## 2011-10-28 LAB — CBC
HCT: 30.9 % — ABNORMAL LOW (ref 36.0–46.0)
Hemoglobin: 9.9 g/dL — ABNORMAL LOW (ref 12.0–15.0)
MCH: 26.2 pg (ref 26.0–34.0)
MCHC: 32 g/dL (ref 30.0–36.0)
MCV: 81.7 fL (ref 78.0–100.0)
RDW: 16.9 % — ABNORMAL HIGH (ref 11.5–15.5)

## 2011-10-28 MED ORDER — ACETAMINOPHEN 10 MG/ML IV SOLN
1000.0000 mg | Freq: Four times a day (QID) | INTRAVENOUS | Status: AC
  Administered 2011-10-28 – 2011-10-29 (×4): 1000 mg via INTRAVENOUS
  Filled 2011-10-28 (×4): qty 100

## 2011-10-28 NOTE — Progress Notes (Signed)
2 Days Post-Op  Subjective: Appears to be in mild distress this am, c/o "soreness" in her abdomen.  Objective: Vital signs in last 24 hours: Temp:  [97.9 F (36.6 C)-98.5 F (36.9 C)] 98.4 F (36.9 C) (07/12 0155) Pulse Rate:  [68-79] 70  (07/12 0155) Resp:  [16-20] 16  (07/12 0810) BP: (103-121)/(43-54) 106/53 mmHg (07/12 0155) SpO2:  [97 %-100 %] 97 % (07/12 0810) Last BM Date: 10/25/11  Intake/Output from previous day: 07/11 0701 - 07/12 0700 In: 610 [P.O.:480; I.V.:130] Out: 2125 [Urine:2050; Drains:75] Intake/Output this shift:    General appearance: alert, cooperative, appears stated age, fatigued, mild distress and moderately obese Abdomen: dressing removed old blood noted, staples intact, no erythema or drainage noted abdomen is soft, tender to palpation, + BS, JP drain remains with serosanguinous drainage (75 ml over past 24 hours) WBC elevated today @ 13.4 but she remains afebrile.  Lab Results:   [facility] 10/27/11 0725  WBC 13.4*  HGB 10.2*  HCT 31.6*  PLT 409*   BMET  Basename 10/27/11 0725  NA 136  K 4.3  CL 99  CO2 27  GLUCOSE 129*  BUN 7  CREATININE 0.67  CALCIUM 9.0   PT/INR No results found for this basename: LABPROT:2,INR:2 in the last 72 hours ABG No results found for this basename: PHART:2,PCO2:2,PO2:2,HCO3:2 in the last 72 hours  Studies/Results: No results found.  Anti-infectives: Anti-infectives     Start     Dose/Rate Route Frequency Ordered Stop   10/26/11 1530   ertapenem (INVANZ) 1 g in sodium chloride 0.9 % 50 mL IVPB        1 g 100 mL/hr over 30 Minutes Intravenous Every 24 hours 10/26/11 1436 10/26/11 1802   10/25/11 1428   ertapenem (INVANZ) 1 g in sodium chloride 0.9 % 50 mL IVPB        1 g 100 mL/hr over 30 Minutes Intravenous 60 min pre-op 10/25/11 1428 10/26/11 0928          Assessment/Plan: s/p Procedure(s) (LRB): LAPAROSCOPIC PARTIAL COLECTOMY (N/A) 1. Recheck WBC 2.PT/OT 3. OOB TID 4. Abdominal  Binder 5. D/C foley 6. IV tylenol (for improved pain control)  LOS: 2 days    Jahmani Staup 10/28/2011

## 2011-10-28 NOTE — Progress Notes (Signed)
I have seen and examined the patient and agree with the assessment and plans.  Kaegan Hettich A. Ginamarie Banfield  MD, FACS  

## 2011-10-28 NOTE — Progress Notes (Signed)
Orthopedic Tech Progress Note Patient Details:  Kara [name] 1980/10/01 [id number]  Ortho Devices Type of Ortho Device: Abdominal binder As requested, delivered two abdominal binders to pt room.  Leo Grosser T 10/28/2011, 12:04 PM

## 2011-10-29 NOTE — Evaluation (Signed)
Physical Therapy Evaluation Patient Details Name: Kara [name] MRN: [id number] DOB: 11-02-1980 Today's Date: 10/29/2011 Time: 4098-1191 PT Time Calculation (min): 14 min  PT Assessment / Plan / Recommendation Clinical Impression  Pt with significant level of pain limiting ability during eval.  Pt is good candidate for use of RW with gait ST.  Pt would benefit from skilled PT intervention to increase gait/mobility and provide pt/family education.    PT Assessment  Patient needs continued PT services    Follow Up Recommendations  Home health PT    Barriers to Discharge None      Equipment Recommendations  Rolling walker with 5" wheels    Recommendations for Other Services     Frequency Min 3X/week    Precautions / Restrictions Precautions Precautions: None Required Braces or Orthoses: Other Brace/Splint Other Brace/Splint: abdominal binder   Pertinent Vitals/Pain       Mobility  Bed Mobility Bed Mobility: Supine to Sit;Sit to Supine Supine to Sit: 4: Min assist Sit to Supine: 4: Min guard Transfers Transfers: Sit to Stand;Stand to Sit Sit to Stand: 4: Min assist Stand to Sit: 4: Min guard Details for Transfer Assistance: verbal cues for hand placement Ambulation/Gait Ambulation/Gait Assistance: 4: Min assist Ambulation Distance (Feet): 30 Feet Assistive device: 1 person hand held assist (and IV pole for support) Ambulation/Gait Assistance Details: PT to try RW during next Rx session.  Verbal cues to keep eyes open. Gait Pattern: Shuffle;Decreased stride length Gait velocity: decreased    Exercises     PT Diagnosis: Difficulty walking;Acute pain  PT Problem List: Decreased activity tolerance;Decreased mobility;Decreased knowledge of use of DME;Pain PT Treatment Interventions: DME instruction;Gait training;Stair training;Functional mobility training;Therapeutic activities;Patient/family education   PT Goals Acute Rehab PT Goals PT Goal Formulation: With  patient Time For Goal Achievement: 11/05/11 Potential to Achieve Goals: Good Pt will go Supine/Side to Sit: with modified independence PT Goal: Supine/Side to Sit - Progress: Goal set today Pt will go Sit to Supine/Side: with modified independence PT Goal: Sit to Supine/Side - Progress: Goal set today Pt will go Sit to Stand: with modified independence PT Goal: Sit to Stand - Progress: Goal set today Pt will go Stand to Sit: with modified independence PT Goal: Stand to Sit - Progress: Goal set today Pt will Transfer Bed to Chair/Chair to Bed: with modified independence PT Transfer Goal: Bed to Chair/Chair to Bed - Progress: Goal set today Pt will Ambulate: >150 feet;with supervision;with rolling walker PT Goal: Ambulate - Progress: Goal set today Pt will Go Up / Down Stairs: 3-5 stairs;with min assist;with rolling walker PT Goal: Up/Down Stairs - Progress: Goal set today  Visit Information  Last PT Received On: 10/29/11 Assistance Needed: +1    Subjective Data  Subjective: I am feeling dizzy.  I think I ate my lunch too fast. Patient Stated Goal: home   Prior Functioning  Home Living Lives With: Family Available Help at Discharge: Family Type of Home: House Home Access: Stairs to enter Secretary/administrator of Steps: 3 Entrance Stairs-Rails: None Home Layout: One level Bathroom Shower/Tub: Network engineer: None Prior Function Level of Independence: Independent Able to Take Stairs?: Yes Communication Communication: No difficulties    Cognition  Overall Cognitive Status: Appears within functional limits for tasks assessed/performed Arousal/Alertness: Awake/alert Orientation Level: Appears intact for tasks assessed Behavior During Session: [facility] for tasks performed    Extremity/Trunk Assessment     Balance    End of Session PT -  End of Session Equipment Utilized During Treatment: Gait belt Activity Tolerance: Patient  limited by pain Patient left: in bed;with call bell/phone within reach;with nursing in room Nurse Communication: Mobility status  GP     Ilda Foil 10/29/2011, 3:02 PM  Aida Raider, PT  Office # [phone] Pager [phone]

## 2011-10-29 NOTE — Progress Notes (Signed)
Patient ID: Kara [name], female   DOB: 08/11/1980, 31 y.o.   MRN: [id number]  General Surgery - [facility] - Progress Note  POD# 3  Subjective: Patient ambulated this AM.  Taking CL diet without nausea.  BM x 2 last PM - soft.  No pain.  Objective: Vital signs in last 24 hours: Temp:  [98 F (36.7 C)-98.5 F (36.9 C)] 98.5 F (36.9 C) (07/13 0625) Pulse Rate:  [76-106] 106  (07/13 0625) Resp:  [14-18] 16  (07/13 0828) BP: (105-122)/(39-63) 106/39 mmHg (07/13 0625) SpO2:  [96 %-100 %] 100 % (07/13 0828) FiO2 (%):  [2 %] 2 % (07/13 0828) Last BM Date: 10/25/11  Intake/Output from previous day: 07/12 0701 - 07/13 0700 In: 1560 [P.O.:360; I.V.:1100; IV Piggyback:100] Out: 735 [Urine:650; Drains:85]  Exam: HEENT - clear, not icteric Neck - soft Chest - clear bilaterally Cor - RRR, no murmur Abd - soft, obese; BS present; wounds clear and dry, binder on; drain on left abd with serosanguinous output Ext - no significant edema Neuro - grossly intact, no focal deficits  Lab Results:   [facility] 10/28/11 0958 10/27/11 0725  WBC 10.0 13.4*  HGB 9.9* 10.2*  HCT 30.9* 31.6*  PLT 378 409*     Basename 10/27/11 0725  NA 136  K 4.3  CL 99  CO2 27  GLUCOSE 129*  BUN 7  CREATININE 0.67  CALCIUM 9.0    Studies/Results: No results found.  Assessment / Plan: 1.  Status sigmoid colectomy for diverticular disease  - advance to regular diet  - dressing removed and replaced  - continue drain  - OOB, ambulate  - discontinue Entereg per protocol  Velora Heckler, MD, [facility] Office: [phone]  10/29/2011

## 2011-10-30 LAB — TYPE AND SCREEN
Antibody Screen: NEGATIVE
Unit division: 0

## 2011-10-30 MED ORDER — MORPHINE SULFATE 2 MG/ML IJ SOLN
1.0000 mg | INTRAMUSCULAR | Status: DC | PRN
  Administered 2011-10-30: 1 mg via INTRAVENOUS
  Filled 2011-10-30: qty 1

## 2011-10-30 MED ORDER — ACETAMINOPHEN 325 MG PO TABS
650.0000 mg | ORAL_TABLET | ORAL | Status: DC | PRN
  Administered 2011-11-05: 650 mg via ORAL
  Filled 2011-10-30: qty 2

## 2011-10-30 MED ORDER — HYDROCODONE-ACETAMINOPHEN 5-325 MG PO TABS
1.0000 | ORAL_TABLET | ORAL | Status: DC | PRN
  Administered 2011-10-30 – 2011-10-31 (×5): 2 via ORAL
  Filled 2011-10-30 (×6): qty 2

## 2011-10-30 NOTE — Progress Notes (Signed)
Patient ID: Kara [name], female   DOB: Sep 05, 1980, 31 y.o.   MRN: [id number]  General Surgery - [facility] - Progress Note  POD# 4  Subjective: Patient ambulated limited amount yesterday.  Complains of pain.  Regular diet tolerated.  Loose BM's.  Objective: Vital signs in last 24 hours: Temp:  [98.2 F (36.8 C)-100 F (37.8 C)] 98.6 F (37 C) (07/14 0508) Pulse Rate:  [84-108] 108  (07/14 0508) Resp:  [12-20] 16  (07/14 0508) BP: (105-134)/(43-78) 134/51 mmHg (07/14 0508) SpO2:  [97 %-100 %] 99 % (07/14 0833) Last BM Date: 10/25/11  Intake/Output from previous day: 07/13 0701 - 07/14 0700 In: 600 [I.V.:600] Out: 50 [Drains:50]  Exam: HEENT - clear, not icteric Neck - soft Chest - clear bilaterally Cor - RRR, no murmur Abd - soft, obese, binder in place; serous from JP; BS present Ext - no significant edema Neuro - grossly intact, no focal deficits  Lab Results:   Basename 10/28/11 0958  WBC 10.0  HGB 9.9*  HCT 30.9*  PLT 378    No results found for this basename: NA:2,K:2,CL:2,CO2:2,GLUCOSE:2,BUN:2,CREATININE:2,CALCIUM:2 in the last 72 hours  Studies/Results: No results found.  Assessment / Plan: 1.  Status post sigmoid colectomy  - OOB, ambulate  - drain out tomorrow  - discontinue PCA  - po pain Rx  Velora Heckler, MD, [facility] Office: [phone]  10/30/2011

## 2011-10-31 MED ORDER — MORPHINE SULFATE 2 MG/ML IJ SOLN
1.0000 mg | INTRAMUSCULAR | Status: DC | PRN
  Administered 2011-11-03 – 2011-11-04 (×3): 2 mg via INTRAMUSCULAR
  Filled 2011-10-31 (×4): qty 1

## 2011-10-31 MED ORDER — OXYCODONE HCL 5 MG PO TABS
5.0000 mg | ORAL_TABLET | ORAL | Status: DC | PRN
  Administered 2011-10-31 – 2011-11-09 (×31): 10 mg via ORAL
  Filled 2011-10-31 (×33): qty 2

## 2011-10-31 NOTE — Care Management Note (Signed)
  Page 2 of 2   11/03/2011     10:12:16 AM   CARE MANAGEMENT NOTE 11/03/2011  Patient:  ARIYONA, EID A   Account Number:  [id number]  Date Initiated:  10/26/2011  Documentation initiated by:  Carlyle Lipa  Subjective/Objective Assessment:   colectomy; NPO, IVF, PCA     Action/Plan:   home when stable postop   Anticipated DC Date:  11/04/2011   Anticipated DC Plan:  HOME W HOME HEALTH SERVICES      DC Planning Services  CM consult      Choice offered to / List presented to:     DME arranged  3-N-1  Levan Hurst      DME agency  [facility]     [facility] arranged  HH-1 RN  HH-2 PT      [facility] agency  [facility]   Status of service:  In process, will continue to follow Medicare Important Message given?   (If response is "NO", the following Medicare IM given date fields will be blank) Date Medicare IM given:   Date Additional Medicare IM given:    Discharge Disposition:    Per UR Regulation:  Reviewed for med. necessity/level of care/duration of stay  If discussed at Long Length of Stay Meetings, dates discussed:    Comments:   11-03-11 Surgery opened incision today at bedside and started packing. Orders for HHPT and HHRN. Spoke with patient gave her a list of home health agencies for [employer]. Patient would like [facility]. Referral made. Patient confirmed her EPIC facesheet information is correct. Ronny Flurry RN BSN [phone]   10-31-11 Spoke with patient.  Her chilkdren are staying with a family member until August 15. She would likw walker and 3 in 1 for home use.  Received orders for same . Ronny Flurry RN BSN [phone]

## 2011-10-31 NOTE — Progress Notes (Signed)
Patient ID: Kara [name], female   DOB: 1980-08-10, 31 y.o.   MRN: [id number] Patient ID: Kara [name], female   DOB: 05-Feb-1981, 31 y.o.   MRN: [id number]  General Surgery - [facility] - Progress Note  POD# 5  Subjective: Pt co pain control issues, the norco does not help her pain well, ambulating well overall, tolerating regular diet well.  Objective: Vital signs in last 24 hours: Temp:  [98.2 F (36.8 C)-100 F (37.8 C)] 98.2 F (36.8 C) (07/15 0616) Pulse Rate:  [83-100] 83  (07/15 0616) Resp:  [17-18] 18  (07/15 0616) BP: (122-131)/(58-62) 122/58 mmHg (07/15 0616) SpO2:  [98 %-99 %] 99 % (07/15 0616) Last BM Date: 10/29/11  Intake/Output from previous day: 07/14 0701 - 07/15 0700 In: 1200 [P.O.:1200] Out: 85 [Drains:85]  Exam: Abd - soft, obese, binder in place; serosang from JP; +BS, mildly tender, incisions c/d/i  Lab Results:   Basename 10/28/11 0958  WBC 10.0  HGB 9.9*  HCT 30.9*  PLT 378    No results found for this basename: NA:2,K:2,CL:2,CO2:2,GLUCOSE:2,BUN:2,CREATININE:2,CALCIUM:2 in the last 72 hours  Studies/Results: No results found.  Assessment / Plan: 1.  Status post sigmoid colectomy  - OOB, ambulate  - pain control issues-change to oxycodone, will keep here today  - IV out and hard stick, ok to leave out and will order IM breakthrough pain meds.  - drain out tomorrow cause likely home tomorrow if pain control better  Jesstin Studstill, [facility] Office: [phone]  10/31/2011

## 2011-10-31 NOTE — Progress Notes (Signed)
She clearly tells me today she is burping but has had no flatus or bm since surgery.  I asked her several times.  Her abdomen is obese, there are some bs present, wound clean I am going to leave her on fulls for now, have midline placed, follow ins and outs. I am concerned a little about no flatus/bm with new anastomosis and she will need to have bowel function prior to leaving

## 2011-10-31 NOTE — Progress Notes (Signed)
Physical Therapy Treatment Patient Details Name: Kara [name] MRN: [id number] DOB: 1980/07/02 Today's Date: 10/31/2011 Time: 4098-1191 PT Time Calculation (min): 32 min  PT Assessment / Plan / Recommendation Comments on Treatment Session  Limited primarily by pain and fatigue today but moving better with RW. Likely will need 3in1 for d/c home as well.     Follow Up Recommendations  Home health PT    Barriers to Discharge        Equipment Recommendations  Rolling walker with 5" wheels;3 in 1 bedside comode    Recommendations for Other Services    Frequency Min 3X/week   Plan Discharge plan remains appropriate;Frequency remains appropriate    Precautions / Restrictions Precautions Precautions: Fall Required Braces or Orthoses: Other Brace/Splint Other Brace/Splint: abdominal binder       Mobility  Bed Mobility Bed Mobility: Rolling Left;Left Sidelying to Sit Rolling Left: 4: Min assist Left Sidelying to Sit: 4: Min assist;HOB elevated;With rails (30 degrees) Details for Bed Mobility Assistance: cues for rolling to decrease abdominal pain, use of pillow for splinting, sequencing cues throughout, facilitation to bring trunk upright Transfers Transfers: Sit to Stand;Stand to Sit Sit to Stand: 5: Supervision;From bed;From toilet;With upper extremity assist Stand to Sit: 5: Supervision;To toilet;To bed;To chair/3-in-1;With upper extremity assist Details for Transfer Assistance: cues for safe hand placement using RW, pt slow to rise secondary to pain Ambulation/Gait Ambulation/Gait Assistance: 5: Supervision Assistive device: Rolling walker Ambulation/Gait Assistance Details: cues for proper use of RW, upright posture and relaxed shoulder posture Gait Pattern: Step-through pattern;Decreased stride length    Exercises     PT Goals Acute Rehab PT Goals PT Goal: Supine/Side to Sit - Progress: Progressing toward goal PT Goal: Sit to Stand - Progress: Progressing toward  goal PT Goal: Stand to Sit - Progress: Progressing toward goal PT Transfer Goal: Bed to Chair/Chair to Bed - Progress: Progressing toward goal PT Goal: Ambulate - Progress: Progressing toward goal PT Goal: Up/Down Stairs - Progress: Not progressing  Visit Information  Last PT Received On: 10/31/11 Assistance Needed: +1    Subjective Data  Subjective: They had to give me a lot of medicine last night so that I could sleep. I was just in so much pain.    Cognition  Overall Cognitive Status: Appears within functional limits for tasks assessed/performed Arousal/Alertness: Awake/alert Orientation Level: Appears intact for tasks assessed Behavior During Session: [facility] for tasks performed    Balance     End of Session PT - End of Session Equipment Utilized During Treatment: Gait belt Activity Tolerance: Patient tolerated treatment well;Patient limited by fatigue;Patient limited by pain Patient left: with call bell/phone within reach;in chair Nurse Communication: Patient requests pain meds   GP     [facility] HELEN 10/31/2011, 9:41 AM

## 2011-11-01 LAB — BASIC METABOLIC PANEL
Chloride: 96 mEq/L (ref 96–112)
GFR calc Af Amer: >90 mL/min (ref >90)
GFR calc non Af Amer: >90 mL/min (ref >90)
Potassium: 3.5 mEq/L (ref 3.5–5.1)
Sodium: 135 mEq/L (ref 135–145)

## 2011-11-01 MED ORDER — KETOROLAC TROMETHAMINE 15 MG/ML IJ SOLN: 15.0000 mg | Freq: Three times a day (TID) | INTRAMUSCULAR | Status: AC | PRN

## 2011-11-01 NOTE — Progress Notes (Signed)
Patient ID: Kara [name], female   DOB: [birth year], 31 y.o.   MRN: [id number]   General Surgery - [facility] - Progress Note  POD# 6  Subjective: Pt reports pain somewhat better, reports only a little bit of gas yesterday, still denies any n/v.  Tolerating full liquids well.  Objective: Vital signs in last 24 hours: Temp:  [98.7 F (37.1 C)-99.9 F (37.7 C)] 98.8 F (37.1 C) (07/16 0604) Pulse Rate:  [85-101] 85  (07/16 0604) Resp:  [18] 18  (07/16 0604) BP: (120-145)/(62-69) 120/68 mmHg (07/16 0604) SpO2:  [96 %-100 %] 100 % (07/16 0604) Last BM Date: 10/29/11  Intake/Output from previous day: 07/15 0701 - 07/16 0700 In: 240 [P.O.:240] Out: 60 [Drains:60]  Exam: Abd - soft, obese, binder in place; serosang from JP; +BS, mildly tender, incisions c/d/i  Lab Results:  No results found for this basename: WBC:2,HGB:2,HCT:2,PLT:2 in the last 72 hours   Basename 11/01/11 0551  NA 135  K 3.5  CL 96  CO2 29  GLUCOSE 147*  BUN 6  CREATININE 0.72  CALCIUM 9.2    Studies/Results: No results found.  Assessment / Plan: 1.  Status post sigmoid colectomy  - OOB, ambulate  - pain control issues improving  - still only minimal bowel function so keep diet at fulls    -WHITE, [facility] Office: [phone]  11/01/2011

## 2011-11-01 NOTE — Progress Notes (Signed)
Physical Therapy Treatment Patient Details Name: Kara [name] MRN: [id number] DOB: 1981-01-18 Today's Date: 11/01/2011 Time: 3086-5784 PT Time Calculation (min): 33 min  PT Assessment / Plan / Recommendation Comments on Treatment Session  Pain better controlled today but still a bit emotional when ambulating in the hallway, c/o burning/stretching of her stomach. Moving relatively well. Grandmother to come tomorrow during our session to practice how she will assit pt on the stairs.     Follow Up Recommendations  Home health PT    Barriers to Discharge        Equipment Recommendations       Recommendations for Other Services    Frequency Min 3X/week   Plan Discharge plan remains appropriate;Frequency remains appropriate    Precautions / Restrictions Precautions Precautions: Fall Required Braces or Orthoses: Other Brace/Splint Other Brace/Splint: abdominal binder       Mobility  Bed Mobility Bed Mobility: Rolling Right;Right Sidelying to Sit Rolling Right: 4: Min assist Right Sidelying to Sit: 4: Min assist Details for Bed Mobility Assistance: hand over hand as well as verbal sequencing cues for rolling and bringing trunk upright Transfers Transfers: Sit to Stand;Stand to Sit Sit to Stand: 4: Min guard;From bed;From chair/3-in-1;With upper extremity assist Stand to Sit: 5: Supervision;To chair/3-in-1;With upper extremity assist Details for Transfer Assistance: cues for safe hand placement especially with RW Ambulation/Gait Ambulation/Gait Assistance: 5: Supervision Ambulation Distance (Feet): 120 Feet Assistive device: Rolling walker Ambulation/Gait Assistance Details: cues for upright posture and correct positioning with RW; facilitation for relaxed shoulder/neck posture  Gait Pattern: Step-through pattern Gait velocity: decreased Stairs: Yes Stairs Assistance: 4: Min assist Stairs Assistance Details (indicate cue type and reason): assist to steady RW on stair and  verbal cues for sequencing Stair Management Technique: With walker;Backwards Number of Stairs: 2       PT Goals Acute Rehab PT Goals PT Goal: Supine/Side to Sit - Progress: Progressing toward goal PT Goal: Sit to Stand - Progress: Progressing toward goal PT Goal: Stand to Sit - Progress: Progressing toward goal PT Transfer Goal: Bed to Chair/Chair to Bed - Progress: Progressing toward goal PT Goal: Ambulate - Progress: Progressing toward goal PT Goal: Up/Down Stairs - Progress: Progressing toward goal  Visit Information  Last PT Received On: 11/01/11 Assistance Needed: +1    Subjective Data  Subjective: Im feeling pretty good right now.   Cognition  Overall Cognitive Status: Appears within functional limits for tasks assessed/performed Arousal/Alertness: Awake/alert Orientation Level: Appears intact for tasks assessed Behavior During Session: [facility] for tasks performed    Balance     End of Session PT - End of Session Equipment Utilized During Treatment: Gait belt Activity Tolerance: Patient tolerated treatment well Patient left: in chair;with call bell/phone within reach Nurse Communication: Mobility status   GP     [employer] HELEN 11/01/2011, 3:37 PM

## 2011-11-01 NOTE — Progress Notes (Signed)
Small amount flatus, tol fulls, abdomen with bs present and approp tender, will keep at fulls, cont drain, needs to be oob as much as possible, add toradol for pain control and check bmet in am

## 2011-11-02 LAB — BASIC METABOLIC PANEL
BUN: 8 mg/dL (ref 6–23)
CO2: 28 mEq/L (ref 19–32)
Calcium: 9.5 mg/dL (ref 8.4–10.5)
Creatinine, Ser: 0.69 mg/dL (ref 0.50–1.10)
GFR calc non Af Amer: >90 mL/min (ref >90)
Glucose, Bld: 129 mg/dL — ABNORMAL HIGH (ref 70–99)

## 2011-11-02 NOTE — Progress Notes (Signed)
Agree with above 

## 2011-11-02 NOTE — Progress Notes (Signed)
Patient ID: Kara [name], female   DOB: [birth year], 31 y.o.   MRN: [id number] Patient ID: Kara [name], female   DOB: 20-Jan-1981, 31 y.o.   MRN: [id number]   General Surgery - [facility] - Progress Note  POD# 7  Subjective: Passed more flatus last night, still no BM, pain control better as long as she stays on top of getting the medicine every 4 hours.  Denies n/v.  Eating breakfast now, walking 4 times a day  Objective: Vital signs in last 24 hours: Temp:  [98.2 F (36.8 C)-98.9 F (37.2 C)] 98.5 F (36.9 C) (07/17 0535) Pulse Rate:  [88-102] 102  (07/17 0535) Resp:  [16-18] 18  (07/17 0535) BP: (115-130)/(55-68) 122/68 mmHg (07/17 0535) SpO2:  [98 %-99 %] 98 % (07/17 0535) Last BM Date: 10/29/11  Intake/Output from previous day: 07/16 0701 - 07/17 0700 In: 360 [P.O.:360] Out: 70 [Drains:70]  Exam: Abd - soft, obese, binder in place; serosang from JP; +BS, mildly tender  Lab Results:  No results found for this basename: WBC:2,HGB:2,HCT:2,PLT:2 in the last 72 hours   Basename 11/02/11 0550 11/01/11 0551  NA 133* 135  K 3.7 3.5  CL 94* 96  CO2 28 29  GLUCOSE 129* 147*  BUN 8 6  CREATININE 0.69 0.72  CALCIUM 9.5 9.2    Studies/Results: No results found.  Assessment / Plan: 1.  Status post sigmoid colectomy  - OOB, ambulate  - pain control issues improving  - some more bowel function, start soft diet at lunch   -WHITE, [facility] Office: [phone]  11/02/2011

## 2011-11-02 NOTE — Progress Notes (Signed)
Physical Therapy Treatment Patient Details Name: Kara [name] MRN: [id number] DOB: 10-25-1980 Today's Date: 11/02/2011 Time: 7829-5621 PT Time Calculation (min): 32 min  PT Assessment / Plan / Recommendation Comments on Treatment Session  Good mobility today but pain still an issue at times. Grandmother present for education concerning bed mobility and stair training.     Follow Up Recommendations  Home health PT    Barriers to Discharge        Equipment Recommendations  Rolling walker with 5" wheels;3 in 1 bedside comode    Recommendations for Other Services    Frequency     Plan Discharge plan remains appropriate;Frequency remains appropriate    Precautions / Restrictions Precautions Precautions: Fall       Mobility  Bed Mobility Bed Mobility: Rolling Right;Rolling Left;Right Sidelying to Sit;Sit to Sidelying Right Rolling Right: 5: Supervision Rolling Left: 4: Min assist Right Sidelying to Sit: 5: Supervision Sit to Sidelying Right: 4: Min assist Details for Bed Mobility Assistance: sequencing cues throughout, min facilitation to bring legs to bed and tactile assist for improved efficiency Transfers Transfers: Sit to Stand;Stand to Sit Sit to Stand: 6: Modified independent (Device/Increase time);With upper extremity assist;From bed;From chair/3-in-1;With armrests Stand to Sit: 6: Modified independent (Device/Increase time);To bed;To chair/3-in-1;With upper extremity assist;With armrests Details for Transfer Assistance: very slow to rise Ambulation/Gait Ambulation/Gait Assistance: 5: Supervision;6: Modified independent (Device/Increase time) Ambulation Distance (Feet): 200 Feet Assistive device: Rolling walker Ambulation/Gait Assistance Details: cues for relaxed shoulder posture; pt transitioned to modified independence as she progressed through the session, needed to sit after about 150 ft secondary to pain pt became shaky and emotion, with a seated rest break pt  able to ambulate back to her room modified independently General Gait Details: decreased step height and stride Stairs: Yes Stairs Assistance: 4: Min assist Stairs Assistance Details (indicate cue type and reason): pt's grandmother educated on proper stabilization of RW for ascending/descending stairs, verbal sequencing cues throughout Stair Management Technique: With walker;Backwards Number of Stairs: 3     Exercises       PT Goals Acute Rehab PT Goals PT Goal: Supine/Side to Sit - Progress: Progressing toward goal PT Goal: Sit to Supine/Side - Progress: Progressing toward goal PT Goal: Sit to Stand - Progress: Met PT Goal: Stand to Sit - Progress: Met PT Transfer Goal: Bed to Chair/Chair to Bed - Progress: Progressing toward goal Pt will Ambulate: >150 feet;with modified independence;with least restrictive assistive device PT Goal: Ambulate - Progress: Updated due to goal met PT Goal: Up/Down Stairs - Progress: Met  Visit Information  Last PT Received On: 11/02/11 Assistance Needed: +1    Subjective Data  Subjective: The nurse tech let me cheat getting out of the bed today.    Cognition  Overall Cognitive Status: Appears within functional limits for tasks assessed/performed Arousal/Alertness: Awake/alert Orientation Level: Appears intact for tasks assessed Behavior During Session: [facility] for tasks performed    Balance     End of Session PT - End of Session Equipment Utilized During Treatment: Gait belt Activity Tolerance: Patient tolerated treatment well Patient left: in bed;with call bell/phone within reach Nurse Communication: Mobility status   GP     [facility] HELEN 11/02/2011, 12:11 PM

## 2011-11-03 LAB — CBC
HCT: 34.7 % — ABNORMAL LOW (ref 36.0–46.0)
Hemoglobin: 11.4 g/dL — ABNORMAL LOW (ref 12.0–15.0)
MCV: 79 fL (ref 78.0–100.0)
RBC: 4.39 MIL/uL (ref 3.87–5.11)
WBC: 10.9 10*3/uL — ABNORMAL HIGH (ref 4.0–10.5)

## 2011-11-03 MED ORDER — HYDROMORPHONE HCL PF 1 MG/ML IJ SOLN: 2.0000 mg | INTRAMUSCULAR | Status: AC

## 2011-11-03 MED ORDER — HYDROMORPHONE HCL PF 1 MG/ML IJ SOLN
INTRAMUSCULAR | Status: AC
  Administered 2011-11-03: 2 mg
  Filled 2011-11-03: qty 2

## 2011-11-03 MED ORDER — AMOXICILLIN-POT CLAVULANATE 500-125 MG PO TABS
1.0000 | ORAL_TABLET | Freq: Three times a day (TID) | ORAL | Status: DC
  Administered 2011-11-03 – 2011-11-09 (×17): 500 mg via ORAL
  Filled 2011-11-03 (×21): qty 1

## 2011-11-03 NOTE — Progress Notes (Signed)
Patient ID: Kara [name], female   DOB: [birth year], 31 y.o.   MRN: [id number]   General Surgery - [facility] - Progress Note  POD# 8  Subjective: Pt relates increased incisional pain and some drainage from her wound last night and this am.  Denies BM.  Still having flatus.  Complaining of chills  Objective: Vital signs in last 24 hours: Temp:  [98.6 F (37 C)-99.6 F (37.6 C)] 98.8 F (37.1 C) (07/18 0441) Pulse Rate:  [96-117] 96  (07/18 0441) Resp:  [18-19] 18  (07/18 0441) BP: (100-131)/(58-75) 131/70 mmHg (07/18 0441) SpO2:  [97 %-100 %] 98 % (07/18 0441) Last BM Date: 10/29/11  Intake/Output from previous day: 07/17 0701 - 07/18 0700 In: -  Out: 55 [Drains:55]  Exam: Abd - soft, obese, large amount of purulent drainage easily expressed from superior portion of incision therefore several staples were removed and a large amount of purulence was expressed.  Culture was sent.  I then noticed some serous drainage from the inferior pole, therefore I removed some staples from there and an even larger amount of purulence was expressed from this area.  There was no significant surrounding erythema but the patient was having severe pain and complaining of chills  Lab Results:  No results found for this basename: WBC:2,HGB:2,HCT:2,PLT:2 in the last 72 hours   Basename 11/02/11 0550 11/01/11 0551  NA 133* 135  K 3.7 3.5  CL 94* 96  CO2 28 29  GLUCOSE 129* 147*  BUN 8 6  CREATININE 0.69 0.72  CALCIUM 9.5 9.2    Studies/Results: No results found.  Assessment / Plan: 1.  Status post sigmoid colectomy  - wound infection- wound has been opened and drained, will start po abx, a culture has been sent, will hold on d/c home, will set up home health to help with dressing changes, may need to open wound more.    - OOB, ambulate  - keep diet at current   -Johnye Kist, [facility] Office: [phone]  11/03/2011

## 2011-11-03 NOTE — Progress Notes (Signed)
Pt called to say her incision was leaking.  Clear fluid oozing from umbilical area. No odor. Dry dressing applied.

## 2011-11-03 NOTE — Progress Notes (Signed)
Orthopedic Tech Progress Note Patient Details:  Kara [name] [birth year] [id number]  Ortho Devices Type of Ortho Device: Abdominal binder Ortho Device/Splint Location: replacement binder Ortho Device/Splint Interventions: Ordered   Shawnie Pons 11/03/2011, 11:28 AM

## 2011-11-03 NOTE — Progress Notes (Signed)
Having bowel function, slow to move, wound infection today opened, will follow

## 2011-11-04 MED ORDER — POLYETHYLENE GLYCOL 3350 17 G PO PACK
17.0000 g | PACK | Freq: Every day | ORAL | Status: DC
  Filled 2011-11-04: qty 1

## 2011-11-04 MED ORDER — AMOXICILLIN-POT CLAVULANATE 500-125 MG PO TABS: 1.0000 | ORAL_TABLET | Freq: Three times a day (TID) | ORAL | Status: DC

## 2011-11-04 MED ORDER — POLYETHYLENE GLYCOL 3350 17 G PO PACK
17.0000 g | PACK | Freq: Once | ORAL | Status: AC
  Administered 2011-11-04: 17 g via ORAL
  Filled 2011-11-04: qty 1

## 2011-11-04 MED ORDER — MORPHINE SULFATE 2 MG/ML IJ SOLN
1.0000 mg | INTRAMUSCULAR | Status: DC | PRN
  Administered 2011-11-04 – 2011-11-07 (×6): 2 mg via INTRAVENOUS
  Filled 2011-11-04 (×8): qty 1

## 2011-11-04 MED ORDER — DOCUSATE SODIUM 100 MG PO CAPS
100.0000 mg | ORAL_CAPSULE | Freq: Two times a day (BID) | ORAL | Status: DC
  Administered 2011-11-04 – 2011-11-09 (×10): 100 mg via ORAL
  Filled 2011-11-04 (×9): qty 1

## 2011-11-04 MED ORDER — HYDROMORPHONE HCL PF 1 MG/ML IJ SOLN
1.0000 mg | Freq: Once | INTRAMUSCULAR | Status: AC
  Administered 2011-11-04: 1 mg via INTRAVENOUS
  Filled 2011-11-04: qty 1

## 2011-11-04 MED ORDER — OXYCODONE HCL 5 MG PO TABS: 10.0000 mg | ORAL_TABLET | ORAL | Status: AC | PRN

## 2011-11-04 MED ORDER — FLUCONAZOLE 150 MG PO TABS
150.0000 mg | ORAL_TABLET | Freq: Once | ORAL | Status: AC
  Administered 2011-11-04: 150 mg via ORAL
  Filled 2011-11-04 (×2): qty 1

## 2011-11-04 NOTE — Consult Note (Signed)
Wound consult requested to assist with abd drainage.  Dr Dwain Sarna in room to remove stapes and allow pus to drain.  WOC consult cancelled at this time, CCS plans to follow for assessment and plan of care. Will not plan to follow further unless re-consulted.  [address], RN, MSN, [employer]  [phone]

## 2011-11-04 NOTE — Discharge Summary (Signed)
Physician Discharge Summary  Patient ID: Kara [name] MRN: [id number] DOB/AGE: December 25, 1980 30 y.o.  Admit date: 10/26/2011 Discharge date: 11/09/2011  Admitting Diagnosis: Recurrent sigmoid diverticulitis  Discharge Diagnosis Patient Active Problem List   Diagnosis Date Noted  . Iron deficiency anemia 09/10/2011  . Diabetes mellitus 09/10/2011    Class: Acute  . Sigmoid diverticulitis 09/10/2011  . HTN (hypertension) 09/05/2011  . Depression 09/05/2011  Recurrent sigmoid Diverticulitis  Consultants None  Procedures LAPAROSCOPIC ASSISTED PARTIAL COLECTOMY  TAKEDOWN OF SPLENIC FLEXURE   Hospital Course: 31 yr old female with recurrent sigmoid diverticulitis who underwent the procedure listed above.  She tolerated the procedure well with no apparent intraoperative complications.  She was transferred to the floor with staples and a drain in place.  Over the next several days her diet was advance slowly although she did not have a bowel movement.  She was on a regular diet but given that she had not had a BM, her diet was regressed slightly until she was passing more gas then progressed again.  On POD#8, she was complaining of more incisional pain and chills.  There was also more drainage on her dressings.  The wound was inspected further and several staples were removed.  This resulted in large amounts of purulent drainage from the wound in the superior portion.  The inferior portion also began to drain and this area was inspected and several staples removed with copious amounts of purulent drainage as well.  More staples then had to be removed from the inferior portion and this area resulted in a very large open wound.  This had to be done due to large amount of purulence and poor healing of the wound. The areas were then dressed and covered.  The patient was not tolerating dressing changes and given the appearance of the wound the patient remained in the hospital for several more days.   We added PO dilaudid to help with the dressing changes and she was able to tolerate them better therefore we felt she could be d/c'd home on this.  Home health was set up for the patient.  She will continue abx for another 2 days at home.  She will continue a regular diet, use colace and miralax as needed, ambulate during the day, return to the clinic next week for drain removal and recheck wound.     Medication List  As of 11/04/2011 10:22 AM   STOP taking these medications         oxyCODONE-acetaminophen 5-325 MG per tablet         TAKE these medications         amoxicillin-clavulanate 500-125 MG per tablet   Commonly known as: AUGMENTIN   Take 1 tablet (500 mg total) by mouth 3 (three) times daily.      desvenlafaxine 100 MG 24 hr tablet   Commonly known as: PRISTIQ   Take 100 mg by mouth every morning.      hydrOXYzine 25 MG tablet   Commonly known as: ATARAX/VISTARIL   Take 25 mg by mouth 2 (two) times daily.      ibuprofen 200 MG tablet   Commonly known as: ADVIL,MOTRIN   Take 1,000 mg by mouth every 8 (eight) hours as needed. pain      lisinopril-hydrochlorothiazide 10-12.5 MG per tablet   Commonly known as: PRINZIDE,ZESTORETIC   Take 1 tablet by mouth every morning.      oxyCODONE 5 MG immediate release tablet   Commonly known  as: Oxy IR/ROXICODONE   Take 2 tablets (10 mg total) by mouth every 4 (four) hours as needed.             Follow-up Information    Follow up with [facility], MD. Schedule an appointment as soon as possible for a visit in 7 days. (Please call our office to schedule an appointment )    Contact information:   [facility] [address], [address] [place] [address] [phone]          Signed: Denny Levy [facility] [phone]  11/04/2011, 10:22 AM

## 2011-11-04 NOTE — Progress Notes (Signed)
9 Days Post-Op  Subjective: Abdominal pressure, passing flatus, tol diet  Objective: Vital signs in last 24 hours: Temp:  [97.9 F (36.6 C)-98.7 F (37.1 C)] 97.9 F (36.6 C) (07/19 0545) Pulse Rate:  [90-104] 90  (07/19 0545) Resp:  [16-18] 16  (07/19 0545) BP: (118-123)/(46-59) 118/46 mmHg (07/19 0545) SpO2:  [96 %-97 %] 97 % (07/19 0545) Last BM Date: 10/29/11  Intake/Output from previous day: 07/18 0701 - 07/19 0700 In: 120 [P.O.:120] Out: 45 [Drains:45] Intake/Output this shift:    General appearance: no distress and morbidly obese GI: bs present, wound open a little at superior aspect and draining from inferior portion, I opened this up completely except for at umbilicus today with purulence present, soft  Lab Results:   [facility] 11/03/11 1518  WBC 10.9*  HGB 11.4*  HCT 34.7*  PLT 463*   BMET  Basename 11/02/11 0550  NA 133*  K 3.7  CL 94*  CO2 28  GLUCOSE 129*  BUN 8  CREATININE 0.69  CALCIUM 9.5   PT/INR No results found for this basename: LABPROT:2,INR:2 in the last 72 hours ABG No results found for this basename: PHART:2,PCO2:2,PO2:2,HCO3:2 in the last 72 hours  Studies/Results: No results found.  Anti-infectives: Anti-infectives     Start     Dose/Rate Route Frequency Ordered Stop   11/04/11 0000   amoxicillin-clavulanate (AUGMENTIN) 500-125 MG per tablet        1 tablet Oral 3 times daily 11/04/11 1006 11/14/11 2359   11/03/11 1100   amoxicillin-clavulanate (AUGMENTIN) 500-125 MG per tablet 500 mg        1 tablet Oral 3 times daily 11/03/11 1004     10/26/11 1530   ertapenem (INVANZ) 1 g in sodium chloride 0.9 % 50 mL IVPB        1 g 100 mL/hr over 30 Minutes Intravenous Every 24 hours 10/26/11 1436 10/26/11 1802   10/25/11 1428   ertapenem (INVANZ) 1 g in sodium chloride 0.9 % 50 mL IVPB        1 g 100 mL/hr over 30 Minutes Intravenous 60 min pre-op 10/25/11 1428 10/26/11 0928          Assessment/Plan: POD 9 1. Po pain  meds 2. pulm toilet, needs to be oob which is challenging due to morbid obesity 3. Has return of bowel function, tol diet but no stool, will give colace and dose of miralax today 4. Wbc up, I opened wound up significant amount today with purulent fluid, will need bid wet to dry and follow.  Cont po abx, will relook tomorrow and see if she can go home but not ready today  LOS: 9 days    [facility] 11/04/2011

## 2011-11-05 DIAGNOSIS — N805 Endometriosis of intestine: Secondary | ICD-10-CM | POA: Diagnosis present

## 2011-11-05 LAB — CBC
HCT: 32.4 % — ABNORMAL LOW (ref 36.0–46.0)
Hemoglobin: 10.4 g/dL — ABNORMAL LOW (ref 12.0–15.0)
MCH: 25.3 pg — ABNORMAL LOW (ref 26.0–34.0)
MCHC: 32.1 g/dL (ref 30.0–36.0)
MCV: 78.8 fL (ref 78.0–100.0)
RBC: 4.11 MIL/uL (ref 3.87–5.11)

## 2011-11-05 LAB — WOUND CULTURE
Culture: NO GROWTH
Gram Stain: NONE SEEN

## 2011-11-05 NOTE — Progress Notes (Signed)
[facility] Progress Note:   10 Days Post-Op  Subjective: Mental status is clear Objective: Vital signs in last 24 hours: Temp:  [98.3 F (36.8 C)-98.6 F (37 C)] 98.3 F (36.8 C) (07/20 0552) Pulse Rate:  [93-108] 93  (07/20 0552) Resp:  [18-20] 18  (07/20 0552) BP: (122-140)/(64-68) 122/65 mmHg (07/20 0552) SpO2:  [99 %-100 %] 99 % (07/20 0552)  Intake/Output from previous day: 07/19 0701 - 07/20 0700 In: 840 [P.O.:840] Out: 60 [Drains:60] Intake/Output this shift:    Physical Exam: Work of breathing is  Normal.  Wound examined and where opened it appears clean.  No erythema.   Lab Results:  Results for orders placed during the hospital encounter of 10/26/11 (from the past 48 hour(s))  CBC     Status: Abnormal   Collection Time   11/03/11  3:18 PM      Component Value Range Comment   WBC 10.9 (*) 4.0 - 10.5 K/uL    RBC 4.39  3.87 - 5.11 MIL/uL    Hemoglobin 11.4 (*) 12.0 - 15.0 g/dL    HCT 14.7 (*) 82.9 - 46.0 %    MCV 79.0  78.0 - 100.0 fL    MCH 26.0  26.0 - 34.0 pg    MCHC 32.9  30.0 - 36.0 g/dL    RDW 56.2 (*) 13.0 - 15.5 %    Platelets 463 (*) 150 - 400 K/uL   CBC     Status: Abnormal   Collection Time   11/05/11  7:30 AM      Component Value Range Comment   WBC 7.5  4.0 - 10.5 K/uL    RBC 4.11  3.87 - 5.11 MIL/uL    Hemoglobin 10.4 (*) 12.0 - 15.0 g/dL    HCT 86.5 (*) 78.4 - 46.0 %    MCV 78.8  78.0 - 100.0 fL    MCH 25.3 (*) 26.0 - 34.0 pg    MCHC 32.1  30.0 - 36.0 g/dL    RDW 69.6 (*) 29.5 - 15.5 %    Platelets 425 (*) 150 - 400 K/uL     Radiology/Results: No results found.  Anti-infectives: Anti-infectives     Start     Dose/Rate Route Frequency Ordered Stop   11/04/11 1800   fluconazole (DIFLUCAN) tablet 150 mg        150 mg Oral  Once 11/04/11 1747 11/04/11 2334   11/04/11 0000   amoxicillin-clavulanate (AUGMENTIN) 500-125 MG per tablet        1 tablet Oral 3 times daily 11/04/11 1006 11/14/11 2359   11/03/11 1100    amoxicillin-clavulanate (AUGMENTIN) 500-125 MG per tablet 500 mg        1 tablet Oral 3 times daily 11/03/11 1004     10/26/11 1530   ertapenem (INVANZ) 1 g in sodium chloride 0.9 % 50 mL IVPB        1 g 100 mL/hr over 30 Minutes Intravenous Every 24 hours 10/26/11 1436 10/26/11 1802   10/25/11 1428   ertapenem (INVANZ) 1 g in sodium chloride 0.9 % 50 mL IVPB        1 g 100 mL/hr over 30 Minutes Intravenous 60 min pre-op 10/25/11 1428 10/26/11 0928          Assessment/Plan: Problem List: Patient Active Problem List  Diagnosis  . HTN (hypertension)  . Depression  . Iron deficiency anemia  . Diabetes mellitus  . Sigmoid diverticulitis  . Endometriosis of  sigmoid  colon    Path shows endometriosis of sigmoid and diverticulosis.  Pt has had 2 c sections and made aware that recurrent pain could be due to endometriosis.  Wound opened and healing secondarily.  Her pain threshold is low and dressing changes are a challenge.  Not ready for discharge today 10 Days Post-Op    LOS: 10 days   Matt B. Daphine Deutscher, MD, [facility] [phone] beeper [phone]  11/05/2011 9:44 AM

## 2011-11-06 MED ORDER — POLYETHYLENE GLYCOL 3350 17 G PO PACK
17.0000 g | PACK | Freq: Every day | ORAL | Status: DC
  Administered 2011-11-07: 17 g via ORAL
  Filled 2011-11-06 (×2): qty 1

## 2011-11-06 NOTE — Progress Notes (Signed)
Patient ID: Kara [name], female   DOB: [birth year], 31 y.o.   MRN: [id number] [facility] Progress Note:   11 Days Post-Op  Subjective: Mental status is clear.  No complaints.  Had first BM this AM.   Objective: Vital signs in last 24 hours: Temp:  [98.1 F (36.7 C)-98.7 F (37.1 C)] 98.2 F (36.8 C) (07/21 0552) Pulse Rate:  [88-96] 88  (07/21 0552) Resp:  [17-18] 18  (07/21 0552) BP: (109-134)/(54-66) 109/54 mmHg (07/21 0552) SpO2:  [98 %-100 %] 100 % (07/21 0552)  Intake/Output from previous day: 07/20 0701 - 07/21 0700 In: 960 [P.O.:960] Out: 30 [Drains:30] Intake/Output this shift:    Physical Exam: Work of breathing is  Normal.  Abdominal pain better.  May need dressing change support at home.    Lab Results:  Results for orders placed during the hospital encounter of 10/26/11 (from the past 48 hour(s))  CBC     Status: Abnormal   Collection Time   11/05/11  7:30 AM      Component Value Range Comment   WBC 7.5  4.0 - 10.5 K/uL    RBC 4.11  3.87 - 5.11 MIL/uL    Hemoglobin 10.4 (*) 12.0 - 15.0 g/dL    HCT 40.9 (*) 81.1 - 46.0 %    MCV 78.8  78.0 - 100.0 fL    MCH 25.3 (*) 26.0 - 34.0 pg    MCHC 32.1  30.0 - 36.0 g/dL    RDW 91.4 (*) 78.2 - 15.5 %    Platelets 425 (*) 150 - 400 K/uL     Radiology/Results: No results found.  Anti-infectives: Anti-infectives     Start     Dose/Rate Route Frequency Ordered Stop   11/04/11 1800   fluconazole (DIFLUCAN) tablet 150 mg        150 mg Oral  Once 11/04/11 1747 11/04/11 2334   11/04/11 0000   amoxicillin-clavulanate (AUGMENTIN) 500-125 MG per tablet        1 tablet Oral 3 times daily 11/04/11 1006 11/14/11 2359   11/03/11 1100   amoxicillin-clavulanate (AUGMENTIN) 500-125 MG per tablet 500 mg        1 tablet Oral 3 times daily 11/03/11 1004     10/26/11 1530   ertapenem (INVANZ) 1 g in sodium chloride 0.9 % 50 mL IVPB        1 g 100 mL/hr over 30 Minutes Intravenous Every 24 hours 10/26/11 1436  10/26/11 1802   10/25/11 1428   ertapenem (INVANZ) 1 g in sodium chloride 0.9 % 50 mL IVPB        1 g 100 mL/hr over 30 Minutes Intravenous 60 min pre-op 10/25/11 1428 10/26/11 0928          Assessment/Plan: Problem List: Patient Active Problem List  Diagnosis  . HTN (hypertension)  . Depression  . Iron deficiency anemia  . Diabetes mellitus  . Sigmoid diverticulitis  . Endometriosis of  sigmoid colon    Hopeful discharge tomorrow per Dr. Magnus Ivan.  Improved.   11 Days Post-Op    LOS: 11 days   Matt B. Daphine Deutscher, MD, [facility] [phone] beeper [phone]  11/06/2011 9:21 AM

## 2011-11-07 MED ORDER — HYDROMORPHONE HCL 2 MG PO TABS
2.0000 mg | ORAL_TABLET | Freq: Two times a day (BID) | ORAL | Status: DC
  Administered 2011-11-07 – 2011-11-09 (×4): 2 mg via ORAL
  Filled 2011-11-07 (×3): qty 1
  Filled 2011-11-07: qty 2

## 2011-11-07 MED ORDER — POLYETHYLENE GLYCOL 3350 17 G PO PACK
17.0000 g | PACK | Freq: Two times a day (BID) | ORAL | Status: DC
  Administered 2011-11-07 – 2011-11-09 (×4): 17 g via ORAL
  Filled 2011-11-07 (×5): qty 1

## 2011-11-07 NOTE — Progress Notes (Signed)
Patient ID: Kara [name], female   DOB: [birth year], 31 y.o.   MRN: [id number] 12 Days Post-Op  Subjective: Had BM but had rectal pain with it, tolerating diet well, some bright red blood on the tissue.  Severe pain with dressing changes and needs IV pain meds to tolerate.  During day does not have to use IV meds.  Objective: Vital signs in last 24 hours: Temp:  [97.6 F (36.4 C)-98.4 F (36.9 C)] 97.6 F (36.4 C) (07/22 0615) Pulse Rate:  [79-90] 79  (07/22 0615) Resp:  [18] 18  (07/22 0615) BP: (117-121)/(60-65) 117/65 mmHg (07/22 0615) SpO2:  [100 %] 100 % (07/22 0615) Last BM Date: 11/04/11  Intake/Output from previous day: 07/21 0701 - 07/22 0700 In: -  Out: 50 [Drains:50] Intake/Output this shift:    General appearance: no distress and morbidly obese GI: +BS, wound open a little at superior aspect, staples to the umbilicus then the lower portion open with some draining still present.   Lab Results:   Basename 11/05/11 0730  WBC 7.5  HGB 10.4*  HCT 32.4*  PLT 425*   BMET No results found for this basename: NA:2,K:2,CL:2,CO2:2,GLUCOSE:2,BUN:2,CREATININE:2,CALCIUM:2 in the last 72 hours PT/INR No results found for this basename: LABPROT:2,INR:2 in the last 72 hours ABG No results found for this basename: PHART:2,PCO2:2,PO2:2,HCO3:2 in the last 72 hours  Studies/Results: No results found.  Anti-infectives: Anti-infectives     Start     Dose/Rate Route Frequency Ordered Stop   11/04/11 1800   fluconazole (DIFLUCAN) tablet 150 mg        150 mg Oral  Once 11/04/11 1747 11/04/11 2334   11/04/11 0000   amoxicillin-clavulanate (AUGMENTIN) 500-125 MG per tablet        1 tablet Oral 3 times daily 11/04/11 1006 11/14/11 2359   11/03/11 1100   amoxicillin-clavulanate (AUGMENTIN) 500-125 MG per tablet 500 mg        1 tablet Oral 3 times daily 11/03/11 1004     10/26/11 1530   ertapenem (INVANZ) 1 g in sodium chloride 0.9 % 50 mL IVPB        1 g 100 mL/hr over 30  Minutes Intravenous Every 24 hours 10/26/11 1436 10/26/11 1802   10/25/11 1428   ertapenem (INVANZ) 1 g in sodium chloride 0.9 % 50 mL IVPB        1 g 100 mL/hr over 30 Minutes Intravenous 60 min pre-op 10/25/11 1428 10/26/11 0928          Assessment/Plan: POD#12-LAPAROSCOPIC ASSISTED PARTIAL COLECTOMY, TAKEDOWN OF SPLENIC FLEXURE  1. Will add PO dilaudid to be taken 30 mins prior to dressing changes to see if this can make dressings changes tolerable without IV medicine 2. pulm toilet, needs to be oob 3. +BM, continue colace and Miralax, tolerating diet well 4. Cont po abx for another 3 days for a total of 7 days.  LOS: 12 days    WHITE, ELIZABETH 11/07/2011

## 2011-11-07 NOTE — Progress Notes (Signed)
Transition to PO pain meds for wound care.  Not tolerating dressing changes.  Once able to tolerate wound care can D/C

## 2011-11-08 MED ORDER — HYDROMORPHONE HCL 2 MG PO TABS: 2.0000 mg | ORAL_TABLET | Freq: Two times a day (BID) | ORAL | Status: AC

## 2011-11-08 MED ORDER — DSS 100 MG PO CAPS: 100.0000 mg | ORAL_CAPSULE | Freq: Two times a day (BID) | ORAL | Status: AC

## 2011-11-08 MED ORDER — AMOXICILLIN-POT CLAVULANATE 500-125 MG PO TABS: 1.0000 | ORAL_TABLET | Freq: Three times a day (TID) | ORAL | Status: AC

## 2011-11-08 MED ORDER — POLYETHYLENE GLYCOL 3350 17 G PO PACK: 17.0000 g | PACK | Freq: Two times a day (BID) | ORAL | Status: AC

## 2011-11-08 NOTE — Progress Notes (Signed)
Pt called RN to room because she had "green drainage"  From her incision.  I observed a small amount of tan drainage at a left abdominal lap site.  No odor or redness at site noted.  Pt's temp 99.8,R-18,P102,BP135/83,O2 sat 96 ra.  Cleansed site with small amount normal saline and applied a dry dressing. Pt insistent that the MD be called because she said she didn't want to go home with an infection and she said she wanted something done.  Pt's midline abdominal dressing was changed.  Granulation tissue present in both open areas of the incision.  Only a very small amount of purulent drainage present at the base of the inferior aspect of the open incision.  No odor present.  Dr. Biagio Quint notified of the patient's concerns.  Pt will be seen in am rounds.

## 2011-11-08 NOTE — Progress Notes (Signed)
Looking better continue wound care home tomorrow

## 2011-11-08 NOTE — Progress Notes (Signed)
Physical Therapy Treatment Patient Details Name: Kara [name] MRN: [id number] DOB: 12-27-1980 Today's Date: 11/08/2011 Time: 5621-3086 PT Time Calculation (min): 21 min  PT Assessment / Plan / Recommendation Comments on Treatment Session  Kara [name] was admitted for laproscopic cholesystectomy 10/26/11. Presented to physical therapy initially with mobility very limited by pain. Has progressed very well with bed mobility, transfers and ambulation to modified independence. Will have appropriate assist from grandmother at home for stairs. No further acute or f/u PT needs at this time. PT signing off.     Follow Up Recommendations  No PT follow up    Barriers to Discharge  NA      Equipment Recommendations    Pt has 3in1 and RW for d/c   Recommendations for Other Services  NA  Frequency     Plan Discharge plan needs to be updated; All goals met and education completed, patient dischaged from PT services    Precautions / Restrictions Precautions Precautions: None Other Brace/Splint: abdominal binder when ambulating Restrictions Weight Bearing Restrictions: No       Mobility  Bed Mobility Bed Mobility: Rolling Right;Right Sidelying to Sit Rolling Right: 6: Modified independent (Device/Increase time) Right Sidelying to Sit: 6: Modified independent (Device/Increase time);HOB flat Transfers Sit to Stand: 6: Modified independent (Device/Increase time);With upper extremity assist;From bed;From chair/3-in-1 Stand to Sit: 6: Modified independent (Device/Increase time);With upper extremity assist;To toilet;To chair/3-in-1 Ambulation/Gait Ambulation/Gait Assistance: 6: Modified independent (Device/Increase time) Ambulation Distance (Feet): 400 Feet Assistive device: Rolling walker Ambulation/Gait Assistance Details: good speed and upright posture, shoulders appear more relaxed General Gait Details: toe out posture and pt with decreased step height tending to drag her feet, did educate  her on risk of tripping over items on the floor with this pattern and pt verbalizes understanding Stairs Assistance: 4: Min assist Stairs Assistance Details (indicate cue type and reason): hand held assist on the left to ascend with RUE resting on rail to simulate wall at home, to descend the opposite was performed; pt verbalizes good understanding and ability to direct grandmother to assist her as needed Stair Management Technique: No rails;Forwards       PT Goals Acute Rehab PT Goals PT Goal: Supine/Side to Sit - Progress: Met Pt will go Sit to Supine/Side:  (not addressed today, pt verbalizes technique) PT Goal: Sit to Stand - Progress: Met PT Goal: Stand to Sit - Progress: Met PT Transfer Goal: Bed to Chair/Chair to Bed - Progress: Met PT Goal: Ambulate - Progress: Met PT Goal: Up/Down Stairs - Progress: Met  Visit Information  Last PT Received On: 11/08/11 Assistance Needed: +1    Subjective Data  Subjective: I have been trying to walk without the walker.   Cognition  Overall Cognitive Status: Appears within functional limits for tasks assessed/performed Arousal/Alertness: Awake/alert Orientation Level: Appears intact for tasks assessed Behavior During Session: [facility] for tasks performed    Balance     End of Session PT - End of Session Equipment Utilized During Treatment: Gait belt Activity Tolerance: Patient tolerated treatment well Patient left: Other (comment);with call bell/phone within reach (on the toilet)   GP     [facility] HELEN 11/08/2011, 12:30 PM

## 2011-11-08 NOTE — Progress Notes (Signed)
Patient ID: Kara [name], female   DOB: 1981-03-01, 31 y.o.   MRN: [id number] Patient ID: Kara [name], female   DOB: May 30, 1980, 31 y.o.   MRN: [id number] 13 Days Post-Op  Subjective: No major changes from yesterday, do not get the diluadid with the dressing changes yesterday.  Tolerating diet well.  Wants to go home but has anxiety about pain  Objective: Vital signs in last 24 hours: Temp:  [97.8 F (36.6 C)-98.8 F (37.1 C)] 97.8 F (36.6 C) (07/23 6578) Pulse Rate:  [79-107] 79  (07/23 0608) Resp:  [16-18] 16  (07/23 0608) BP: (122-135)/(67-80) 126/78 mmHg (07/23 0608) SpO2:  [94 %-100 %] 98 % (07/23 0608) Last BM Date: 11/07/11  Intake/Output from previous day: 07/22 0701 - 07/23 0700 In: -  Out: 15 [Drains:15] Intake/Output this shift:    General appearance: no distress and morbidly obese GI: +BS, wound open a little at superior aspect, staples to the umbilicus then the lower portion open with some draining still present.   Lab Results:  No results found for this basename: WBC:2,HGB:2,HCT:2,PLT:2 in the last 72 hours BMET No results found for this basename: NA:2,K:2,CL:2,CO2:2,GLUCOSE:2,BUN:2,CREATININE:2,CALCIUM:2 in the last 72 hours PT/INR No results found for this basename: LABPROT:2,INR:2 in the last 72 hours ABG No results found for this basename: PHART:2,PCO2:2,PO2:2,HCO3:2 in the last 72 hours  Studies/Results: No results found.  Anti-infectives: Anti-infectives     Start     Dose/Rate Route Frequency Ordered Stop   11/04/11 1800   fluconazole (DIFLUCAN) tablet 150 mg        150 mg Oral  Once 11/04/11 1747 11/04/11 2334   11/04/11 0000   amoxicillin-clavulanate (AUGMENTIN) 500-125 MG per tablet        1 tablet Oral 3 times daily 11/04/11 1006 11/14/11 2359   11/03/11 1100   amoxicillin-clavulanate (AUGMENTIN) 500-125 MG per tablet 500 mg        1 tablet Oral 3 times daily 11/03/11 1004     10/26/11 1530   ertapenem (INVANZ) 1 g in sodium chloride  0.9 % 50 mL IVPB        1 g 100 mL/hr over 30 Minutes Intravenous Every 24 hours 10/26/11 1436 10/26/11 1802   10/25/11 1428   ertapenem (INVANZ) 1 g in sodium chloride 0.9 % 50 mL IVPB        1 g 100 mL/hr over 30 Minutes Intravenous 60 min pre-op 10/25/11 1428 10/26/11 0928          Assessment/Plan: POD#12-LAPAROSCOPIC ASSISTED PARTIAL COLECTOMY, TAKEDOWN OF SPLENIC FLEXURE  1. If can tolerate dressing change this am with PO dilaudid then can go home. 2. pulm toilet, needs to be oob 3. +BM, continue colace and Miralax, tolerating diet well 4. Cont po abx for another 2 days for a total of 7 days.  LOS: 13 days    Derell Bruun 11/08/2011

## 2011-11-09 ENCOUNTER — Telehealth (INDEPENDENT_AMBULATORY_CARE_PROVIDER_SITE_OTHER): Payer: Self-pay

## 2011-11-09 MED ORDER — BACITRACIN-NEOMYCIN-POLYMYXIN 400-5-5000 EX OINT
TOPICAL_OINTMENT | Freq: Every day | CUTANEOUS | Status: DC
  Administered 2011-11-09: 1 via TOPICAL
  Filled 2011-11-09 (×2): qty 1

## 2011-11-09 NOTE — Progress Notes (Signed)
Wounds inspected and they look fine. Vitals okay. Her midline wound is packed and granulating nicely.  The left trocar site dermabond has come off and had some thin serous drainage which is scant and there is no evidence of infection.  No erythema or purulence and no green drainage seen.  Continue wound care.

## 2011-11-09 NOTE — Telephone Encounter (Signed)
Pt states she was just d/c home and upon arrival noticed drainage on dsg around drain site. Pt advised this may have been from increased activity getting from hosp, to car to home.  Pt advised to clean area and apply a clean dry dsg. Pt to monitor area and call to be seen if drainage does not resolve.Pt to call with any concerns. Pt states she is comfortable with this.

## 2011-11-09 NOTE — Progress Notes (Signed)
Pt received discharge instructions, pt verbalized understanding.  Pt went home with grandmother via wheelchair.  Pt already [facility] and they are set up for dressing changes.

## 2011-11-11 ENCOUNTER — Ambulatory Visit (INDEPENDENT_AMBULATORY_CARE_PROVIDER_SITE_OTHER): Payer: Medicaid Other | Admitting: Surgery

## 2011-11-11 ENCOUNTER — Encounter (INDEPENDENT_AMBULATORY_CARE_PROVIDER_SITE_OTHER): Payer: Self-pay | Admitting: Surgery

## 2011-11-11 VITALS — BP 116/82 | HR 72 | Temp 97.9°F | Resp 16 | Ht 62.75 in | Wt 246.8 lb

## 2011-11-11 DIAGNOSIS — Z09 Encounter for follow-up examination after completed treatment for conditions other than malignant neoplasm: Secondary | ICD-10-CM

## 2011-11-11 MED FILL — Oxycodone HCl Tab 5 MG: ORAL | Qty: 2 | Status: AC

## 2011-11-11 MED FILL — Amoxicillin & K Clavulanate Tab 500-125 MG: ORAL | Qty: 1 | Status: AC

## 2011-11-11 MED FILL — Docusate Sodium Cap 100 MG: ORAL | Qty: 1 | Status: AC

## 2011-11-11 MED FILL — Morphine Sulfate Inj 2 MG/ML: INTRAMUSCULAR | Qty: 1 | Status: AC

## 2011-11-11 MED FILL — Hydroxyzine HCl Tab 25 MG: ORAL | Qty: 1 | Status: AC

## 2011-11-11 MED FILL — Polyethylene Glycol 3350 Oral Packet 17 GM: ORAL | Qty: 1 | Status: AC

## 2011-11-11 NOTE — Patient Instructions (Signed)
Use the Hydrogel wound gel daily starting 07/31.  Apply to gauze and cover with dry gauze.

## 2011-11-11 NOTE — Progress Notes (Signed)
Subjective:     Patient ID: Kara [name], female   DOB: 22-Feb-1981, 31 y.o.   MRN: [id number]  HPI  She is here for first postop visit status post left assisted partial colectomy. She had a postop wound infection and is now doing wet-to-dry dressing changes twice a day. She is eating okay and moving her bowels and has had no fevers Review of Systems     Objective:   Physical Exam On exam, the wound is healing well. I removed the rest of the staples.    Assessment:      Patient status post left upper partial sigmoid colectomy for diverticulitis with postop wound infection    Plan:     We will now start hydrogel dressing changes daily. She was to refrain from heavy lifting. I will see her back in 3 weeks

## 2011-11-15 ENCOUNTER — Emergency Department (HOSPITAL_BASED_OUTPATIENT_CLINIC_OR_DEPARTMENT_OTHER): Payer: Medicaid Other

## 2011-11-15 ENCOUNTER — Encounter (HOSPITAL_BASED_OUTPATIENT_CLINIC_OR_DEPARTMENT_OTHER): Payer: Self-pay | Admitting: *Deleted

## 2011-11-15 ENCOUNTER — Emergency Department (HOSPITAL_BASED_OUTPATIENT_CLINIC_OR_DEPARTMENT_OTHER)
Admission: EM | Admit: 2011-11-15 | Discharge: 2011-11-15 | Disposition: A | Discharge status: Home / Self Care | Payer: Medicaid Other | Attending: Emergency Medicine | Admitting: Emergency Medicine

## 2011-11-15 DIAGNOSIS — R109 Unspecified abdominal pain: Secondary | ICD-10-CM | POA: Insufficient documentation

## 2011-11-15 DIAGNOSIS — Z9049 Acquired absence of other specified parts of digestive tract: Secondary | ICD-10-CM | POA: Insufficient documentation

## 2011-11-15 DIAGNOSIS — I10 Essential (primary) hypertension: Secondary | ICD-10-CM | POA: Insufficient documentation

## 2011-11-15 DIAGNOSIS — G8918 Other acute postprocedural pain: Secondary | ICD-10-CM

## 2011-11-15 LAB — COMPREHENSIVE METABOLIC PANEL
ALT: 12 U/L (ref 0–35)
AST: 15 U/L (ref 0–37)
Albumin: 4 g/dL (ref 3.5–5.2)
Alkaline Phosphatase: 49 U/L (ref 39–117)
BUN: 6 mg/dL (ref 6–23)
CO2: 26 mEq/L (ref 19–32)
Calcium: 9.4 mg/dL (ref 8.4–10.5)
Chloride: 100 mEq/L (ref 96–112)
Creatinine, Ser: 0.6 mg/dL (ref 0.50–1.10)
GFR calc Af Amer: >90 mL/min (ref >90)
GFR calc non Af Amer: >90 mL/min (ref >90)
Glucose, Bld: 128 mg/dL — ABNORMAL HIGH (ref 70–99)
Potassium: 3.7 mEq/L (ref 3.5–5.1)
Sodium: 137 mEq/L (ref 135–145)
Total Bilirubin: 0.1 mg/dL — ABNORMAL LOW (ref 0.3–1.2)
Total Protein: 8.3 g/dL (ref 6.0–8.3)

## 2011-11-15 LAB — URINALYSIS, ROUTINE W REFLEX MICROSCOPIC
Leukocytes, UA: NEGATIVE
Nitrite: NEGATIVE
Protein, ur: NEGATIVE mg/dL
Specific Gravity, Urine: 1.012 (ref 1.005–1.030)
Urobilinogen, UA: 0.2 mg/dL (ref 0.0–1.0)

## 2011-11-15 LAB — CBC WITH DIFFERENTIAL/PLATELET
Basophils Absolute: 0 10*3/uL (ref 0.0–0.1)
Eosinophils Relative: 1 % (ref 0–5)
HCT: 31.4 % — ABNORMAL LOW (ref 36.0–46.0)
Lymphs Abs: 3.6 10*3/uL (ref 0.7–4.0)
MCH: 25.8 pg — ABNORMAL LOW (ref 26.0–34.0)
MCV: 78.5 fL (ref 78.0–100.0)
Monocytes Absolute: 0.7 10*3/uL (ref 0.1–1.0)
Monocytes Relative: 8 % (ref 3–12)
Neutro Abs: 4 10*3/uL (ref 1.7–7.7)
Platelets: 473 10*3/uL — ABNORMAL HIGH (ref 150–400)
RDW: 15.9 % — ABNORMAL HIGH (ref 11.5–15.5)
WBC: 8.4 10*3/uL (ref 4.0–10.5)

## 2011-11-15 LAB — PREGNANCY, URINE: Preg Test, Ur: NEGATIVE

## 2011-11-15 LAB — LIPASE, BLOOD: Lipase: 42 U/L (ref 11–59)

## 2011-11-15 MED ORDER — ONDANSETRON HCL 4 MG/2ML IJ SOLN
4.0000 mg | Freq: Once | INTRAMUSCULAR | Status: AC
  Administered 2011-11-15: 4 mg via INTRAVENOUS
  Filled 2011-11-15: qty 2

## 2011-11-15 MED ORDER — IOHEXOL 300 MG/ML  SOLN
100.0000 mL | Freq: Once | INTRAMUSCULAR | Status: AC | PRN
  Administered 2011-11-15: 100 mL via INTRAVENOUS

## 2011-11-15 MED ORDER — AMOXICILLIN-POT CLAVULANATE 875-125 MG PO TABS
1.0000 | ORAL_TABLET | Freq: Once | ORAL | Status: AC
  Administered 2011-11-15: 1 via ORAL
  Filled 2011-11-15: qty 1

## 2011-11-15 MED ORDER — SODIUM CHLORIDE 0.9 % IV BOLUS (SEPSIS)
1000.0000 mL | Freq: Once | INTRAVENOUS | Status: AC
  Administered 2011-11-15: 1000 mL via INTRAVENOUS

## 2011-11-15 MED ORDER — AMOXICILLIN-POT CLAVULANATE 875-125 MG PO TABS: 1.0000 | ORAL_TABLET | Freq: Two times a day (BID) | ORAL | Status: AC

## 2011-11-15 MED ORDER — IOHEXOL 300 MG/ML  SOLN
20.0000 mL | Freq: Once | INTRAMUSCULAR | Status: AC | PRN
  Administered 2011-11-15: 20 mL via ORAL

## 2011-11-15 MED ORDER — HYDROMORPHONE HCL PF 1 MG/ML IJ SOLN
1.0000 mg | Freq: Once | INTRAMUSCULAR | Status: AC
  Administered 2011-11-15: 1 mg via INTRAVENOUS
  Filled 2011-11-15: qty 1

## 2011-11-15 NOTE — ED Notes (Signed)
Surgery on 7/10. Drain fell out the night she was released from hospital and was told by dr that it could be left out. Now c/o increased LLQ pain that her pain medication is not helping.

## 2011-11-15 NOTE — ED Provider Notes (Signed)
History     CSN: [id number]  Arrival date & time 11/15/11  6387   First MD Initiated Contact with Patient 11/15/11 2023      Chief Complaint  Patient presents with  . Abdominal Pain    (Consider location/radiation/quality/duration/timing/severity/associated sxs/prior treatment) HPI Patient s/p colectomy 7/10.  Discharged 7/24 after extended hospitalization for post op infection.  Patient had drain fall out 7/24.  Patient had post op check on 7/26.  Dr. Magnus Ivan saw and told ok and drain remained out.  Patient states wound healing by secondary intention due to post op infection.  Patient has had pain coming and going since surgery but today sharper pain felt like it was twisting.  Patient eating well and having bowel movements.  Patient urinating normally.  Patient states she also had endometriosis found on colon.  Patient states normal menstrual periods-lmp 7/22.  G5p2.  No fever.  [facility]- came today and the nurse told her the discharge from the wound looked serous.   Past Medical History  Diagnosis Date  . Depression   . Anxiety   . Anemia     no current med.  . Diverticular disease of colon   . 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 Vaginal cyst 08/2011    current antibiotic, will finish 08/31/2011  . Hypertension     under control, has been on med. > 4 yrs.  . Family history of anesthesia complication     great aunt had hallucinations after IV med administered during anesthesia  . Endometriosis     Past Surgical History  Procedure Date  . Cesarean section 2004, 01/30/2006  . Appendectomy   . Tubal ligation 07/28/2008    laparoscopic  . Carpal tunnel release 08/23/2011    Procedure: CARPAL TUNNEL RELEASE;  Surgeon: Tami Ribas, MD;  Location: [facility];  Service: Orthopedics;  Laterality: Right;  . Colon surgery 10/26/11    lap partial colectomy    Family History  Problem Relation Age of Onset  . Cancer  Maternal Grandfather     lung    History  Substance Use Topics  . Smoking status: Former Games developer  . Smokeless tobacco: Never Used   Comment: can't remember when she quit smoking  . Alcohol Use: No    OB History    Grav Para Term Preterm Abortions TAB SAB Ect Mult Living   6 2 2  3  3   2       Review of Systems  All other systems reviewed and are negative.    Allergies  Citrus; Pepperoni; Pineapple; Banana; Betadine; Codeine; and Sulfa antibiotics  Home Medications   Current Outpatient Rx  Name Route Sig Dispense Refill  . AMOXICILLIN-POT CLAVULANATE 500-125 MG PO TABS Oral Take 1 tablet (500 mg total) by mouth 3 (three) times daily. 14 tablet 0  . DESVENLAFAXINE SUCCINATE ER 100 MG PO TB24 Oral Take 100 mg by mouth every morning.     . DSS 100 MG PO CAPS Oral Take 100 mg by mouth 2 (two) times daily. 60 capsule 2  . HYDROMORPHONE HCL 2 MG PO TABS Oral Take 1 tablet (2 mg total) by mouth 2 (two) times daily. 30 minutes prior to dressing changes. 60 tablet 0  . HYDROXYZINE HCL 25 MG PO TABS Oral Take 25 mg by mouth 2 (two) times daily.    . IBUPROFEN 200 MG PO TABS Oral Take 1,000 mg by mouth  every 8 (eight) hours as needed. pain    . LISINOPRIL-HYDROCHLOROTHIAZIDE 10-12.5 MG PO TABS Oral Take 1 tablet by mouth every morning.     . OXYCODONE HCL 5 MG PO TABS Oral Take 2 tablets (10 mg total) by mouth every 4 (four) hours as needed. 60 tablet 0    BP 158/91  Pulse 81  Temp 99.3 F (37.4 C) (Oral)  Resp 24  Ht 5' 2.75" (1.594 m)  Wt 246 lb (111.585 kg)  BMI 43.92 kg/m2  SpO2 100%  LMP 10/18/2011  Physical Exam  Nursing note and vitals reviewed. Constitutional: She appears well-developed and well-nourished.       Morbidly obese  HENT:  Head: Normocephalic and atraumatic.  Eyes: Conjunctivae and EOM are normal. Pupils are equal, round, and reactive to light.  Neck: Normal range of motion. Neck supple.  Cardiovascular: Normal rate, regular rhythm, normal heart  sounds and intact distal pulses.   Pulmonary/Chest: Effort normal and breath sounds normal.  Abdominal: Soft. Bowel sounds are normal. She exhibits no distension and no mass. There is tenderness. There is no rebound.       Midline incision healing by secondary intention with pink skin and no discharge- no erythema or warmth of periincisional skin  Musculoskeletal: Normal range of motion.  Neurological: She is alert.  Skin: Skin is warm and dry.  Psychiatric: She has a normal mood and affect. Thought content normal.    ED Course  Procedures (including critical care time)  Labs Reviewed - No data to display No results found.   No diagnosis found.    Discussed with Dr Luisa Hart.  Reviewed ct findings.  Dr. Mignon Pine thinks findings consistent with psot op changes but since she has had infection symptoms and possible diverticulitis will  restart augmentin and have patient follow up tomorrow with Dr. Magnus Ivan.  Reviewed findings and plan with patient and family member and in understanding and agreement.        Hilario Quarry, MD 11/18/11 [id number]

## 2011-11-16 ENCOUNTER — Telehealth (INDEPENDENT_AMBULATORY_CARE_PROVIDER_SITE_OTHER): Payer: Self-pay

## 2011-11-16 ENCOUNTER — Telehealth (INDEPENDENT_AMBULATORY_CARE_PROVIDER_SITE_OTHER): Payer: Self-pay | Admitting: General Surgery

## 2011-11-16 NOTE — Telephone Encounter (Signed)
The patient called stating she was seen last night in the ER and was told to come in today to see Dr Magnus Ivan.  I called Dr Magnus Ivan who said he has reviewed her notes and CT and he wants to call in some antibiotics and see her as scheduled.  She doesn't have to come in today.  He ordered Augmentin 875 mg po BID for 10 days.  I told the pt and she uses [facility] on [address] in [place] [phone].  The patient states she was given a script last night for Augmentin 875/125 daily for 14 days.  I asked Dr Magnus Ivan and he states what they gave is fine.  The patient has an appointment 8/12.

## 2011-11-16 NOTE — Telephone Encounter (Signed)
Called Kara [name] back about wound dressing changes with the Hydrogel. I read back to her what Dr Magnus Ivan wanted her to do and it was to use Hydrogel wound gel daily starting day 11-16-2011. Apply to gauze and cover with dry gauze.

## 2011-11-17 ENCOUNTER — Telehealth (INDEPENDENT_AMBULATORY_CARE_PROVIDER_SITE_OTHER): Payer: Self-pay

## 2011-11-17 ENCOUNTER — Emergency Department (HOSPITAL_BASED_OUTPATIENT_CLINIC_OR_DEPARTMENT_OTHER): Payer: Medicaid Other

## 2011-11-17 ENCOUNTER — Emergency Department (HOSPITAL_BASED_OUTPATIENT_CLINIC_OR_DEPARTMENT_OTHER)
Admission: EM | Admit: 2011-11-17 | Discharge: 2011-11-17 | Disposition: A | Discharge status: Home / Self Care | Payer: Medicaid Other | Attending: Emergency Medicine | Admitting: Emergency Medicine

## 2011-11-17 ENCOUNTER — Encounter (HOSPITAL_BASED_OUTPATIENT_CLINIC_OR_DEPARTMENT_OTHER): Payer: Self-pay | Admitting: Vascular Surgery

## 2011-11-17 DIAGNOSIS — Z9049 Acquired absence of other specified parts of digestive tract: Secondary | ICD-10-CM | POA: Insufficient documentation

## 2011-11-17 DIAGNOSIS — R142 Eructation: Secondary | ICD-10-CM | POA: Insufficient documentation

## 2011-11-17 DIAGNOSIS — R51 Headache: Secondary | ICD-10-CM | POA: Insufficient documentation

## 2011-11-17 DIAGNOSIS — R10814 Left lower quadrant abdominal tenderness: Secondary | ICD-10-CM | POA: Insufficient documentation

## 2011-11-17 DIAGNOSIS — R109 Unspecified abdominal pain: Secondary | ICD-10-CM | POA: Insufficient documentation

## 2011-11-17 DIAGNOSIS — R112 Nausea with vomiting, unspecified: Secondary | ICD-10-CM | POA: Insufficient documentation

## 2011-11-17 DIAGNOSIS — E860 Dehydration: Secondary | ICD-10-CM | POA: Insufficient documentation

## 2011-11-17 DIAGNOSIS — R141 Gas pain: Secondary | ICD-10-CM | POA: Insufficient documentation

## 2011-11-17 DIAGNOSIS — I10 Essential (primary) hypertension: Secondary | ICD-10-CM | POA: Insufficient documentation

## 2011-11-17 LAB — CBC WITH DIFFERENTIAL/PLATELET
Band Neutrophils: 0 % (ref 0–10)
Basophils Absolute: 0 10*3/uL (ref 0.0–0.1)
Basophils Relative: 0 % (ref 0–1)
Eosinophils Absolute: 0.1 10*3/uL (ref 0.0–0.7)
Eosinophils Relative: 1 % (ref 0–5)
HCT: 30.9 % — ABNORMAL LOW (ref 36.0–46.0)
Hemoglobin: 10.2 g/dL — ABNORMAL LOW (ref 12.0–15.0)
Lymphs Abs: 1.6 10*3/uL (ref 0.7–4.0)
MCH: 25.8 pg — ABNORMAL LOW (ref 26.0–34.0)
MCHC: 33 g/dL (ref 30.0–36.0)
Myelocytes: 0 %
Neutro Abs: 3.7 10*3/uL (ref 1.7–7.7)
Promyelocytes Absolute: 0 %
RBC: 3.96 MIL/uL (ref 3.87–5.11)

## 2011-11-17 LAB — URINALYSIS, ROUTINE W REFLEX MICROSCOPIC
Glucose, UA: NEGATIVE mg/dL
Leukocytes, UA: NEGATIVE
Nitrite: NEGATIVE
Specific Gravity, Urine: 1.015 (ref 1.005–1.030)
pH: 8.5 — ABNORMAL HIGH (ref 5.0–8.0)

## 2011-11-17 LAB — COMPREHENSIVE METABOLIC PANEL
AST: 18 U/L (ref 0–37)
Albumin: 4.1 g/dL (ref 3.5–5.2)
BUN: 4 mg/dL — ABNORMAL LOW (ref 6–23)
Chloride: 100 mEq/L (ref 96–112)
Creatinine, Ser: 0.6 mg/dL (ref 0.50–1.10)
Potassium: 4 mEq/L (ref 3.5–5.1)
Total Bilirubin: 0.4 mg/dL (ref 0.3–1.2)
Total Protein: 8.3 g/dL (ref 6.0–8.3)

## 2011-11-17 LAB — LIPASE, BLOOD: Lipase: 25 U/L (ref 11–59)

## 2011-11-17 LAB — LACTIC ACID, PLASMA: Lactic Acid, Venous: 0.9 mmol/L (ref 0.5–2.2)

## 2011-11-17 MED ORDER — SODIUM CHLORIDE 0.9 % IV BOLUS (SEPSIS)
1000.0000 mL | Freq: Once | INTRAVENOUS | Status: AC
  Administered 2011-11-17: 1000 mL via INTRAVENOUS

## 2011-11-17 MED ORDER — PROMETHAZINE HCL 25 MG PO TABS
25.0000 mg | ORAL_TABLET | Freq: Once | ORAL | Status: AC
  Administered 2011-11-17: 25 mg via ORAL
  Filled 2011-11-17: qty 1

## 2011-11-17 MED ORDER — PROMETHAZINE HCL 25 MG RE SUPP: 25.0000 mg | Freq: Four times a day (QID) | RECTAL | Status: DC | PRN

## 2011-11-17 MED ORDER — ONDANSETRON HCL 4 MG/2ML IJ SOLN
4.0000 mg | Freq: Once | INTRAMUSCULAR | Status: AC
  Administered 2011-11-17: 4 mg via INTRAVENOUS
  Filled 2011-11-17: qty 2

## 2011-11-17 MED ORDER — HYDROMORPHONE HCL PF 1 MG/ML IJ SOLN
1.0000 mg | Freq: Once | INTRAMUSCULAR | Status: AC
  Administered 2011-11-17: 1 mg via INTRAVENOUS
  Filled 2011-11-17: qty 1

## 2011-11-17 MED ORDER — PROMETHAZINE HCL 25 MG PO TABS: 25.0000 mg | ORAL_TABLET | Freq: Four times a day (QID) | ORAL | Status: DC | PRN

## 2011-11-17 MED ORDER — PROMETHAZINE HCL 25 MG/ML IJ SOLN
25.0000 mg | Freq: Once | INTRAMUSCULAR | Status: AC
  Administered 2011-11-17: 25 mg via INTRAVENOUS
  Filled 2011-11-17: qty 1

## 2011-11-17 MED ORDER — LIDOCAINE HCL (PF) 1 % IJ SOLN
INTRAMUSCULAR | Status: AC
  Administered 2011-11-17: 5 mL via SUBCUTANEOUS
  Filled 2011-11-17: qty 5

## 2011-11-17 NOTE — ED Notes (Addendum)
Pt reports emesis staring yesterday at 1800. Emesis is yellow-green in color. States she vomited until 6am today but upon awakening today she states she still feels extremely nauseous. Also reports left frontal headache that began last pm and persistent today. States the headache feels like pressure, "like someone pressing on the inside of my head." Reports she has been trying to take Ibuprofen and PO fluids but has been unable to keep anything down. Has recently had a partial colectomy.

## 2011-11-17 NOTE — Telephone Encounter (Signed)
Pt called c/o of severe nausea w/ vomiting that started last night around 6 o'clock.  She called Dr. Daphine Deutscher at 3 a.m this morning and he told her if she still felt badly this morning to go to the ED.  I also advised her to go to [facility] ED for evaluation and possible IV fluids.

## 2011-11-17 NOTE — ED Provider Notes (Signed)
History     CSN: [id number]  Arrival date & time 11/17/11  1419   First MD Initiated Contact with Patient 11/17/11 1503      Chief Complaint  Patient presents with  . Emesis  . Headache    (Consider location/radiation/quality/duration/timing/severity/associated sxs/prior treatment) Patient is a 31 y.o. female presenting with vomiting. The history is provided by the patient.  Emesis  This is a new problem. The current episode started yesterday. The problem occurs 5 to 10 times per day. The problem has been gradually worsening. The emesis has an appearance of stomach contents (Yellow material). There has been no fever. Associated symptoms include abdominal pain and headaches. Pertinent negatives include no chills, no cough, no diarrhea, no fever and no URI. Associated symptoms comments: Unchanged left lower quadrant pain. States that she has not had a bowel movement since yesterday and states that she feels gas in her abdomen but is unable to pass gas.    Past Medical History  Diagnosis Date  . Depression   . Anxiety   . Anemia     no current med.  . Diverticular disease of colon   . 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 Vaginal cyst 08/2011    current antibiotic, will finish 08/31/2011  . Hypertension     under control, has been on med. > 4 yrs.  . Family history of anesthesia complication     great aunt had hallucinations after IV med administered during anesthesia  . Endometriosis     Past Surgical History  Procedure Date  . Cesarean section 2004, 01/30/2006  . Appendectomy   . Tubal ligation 07/28/2008    laparoscopic  . Carpal tunnel release 08/23/2011    Procedure: CARPAL TUNNEL RELEASE;  Surgeon: Tami Ribas, MD;  Location: [facility];  Service: Orthopedics;  Laterality: Right;  . Colon surgery 10/26/11    lap partial colectomy    Family History  Problem Relation Age of Onset  . Cancer Maternal Grandfather     lung  . Hypertension Mother    . Hypertension Father   . Seizures Father   . Hypertension Brother     History  Substance Use Topics  . Smoking status: Former Games developer  . Smokeless tobacco: Never Used   Comment: can't remember when she quit smoking  . Alcohol Use: No    OB History    Grav Para Term Preterm Abortions TAB SAB Ect Mult Living   6 2 2  3  3   2       Review of Systems  Constitutional: Positive for fatigue. Negative for fever and chills.  Respiratory: Negative for cough.   Cardiovascular: Negative for chest pain.  Gastrointestinal: Positive for nausea, vomiting, abdominal pain and abdominal distention. Negative for diarrhea.  Genitourinary: Negative for dysuria.  Neurological: Positive for headaches.       The headache started after repeated vomiting  All other systems reviewed and are negative.    Allergies  Citrus; Pepperoni; Pineapple; Banana; Betadine; Codeine; and Sulfa antibiotics  Home Medications   Current Outpatient Rx  Name Route Sig Dispense Refill  . AMOXICILLIN-POT CLAVULANATE 500-125 MG PO TABS Oral Take 1 tablet (500 mg total) by mouth 3 (three) times daily. 14 tablet 0  . AMOXICILLIN-POT CLAVULANATE 875-125 MG PO TABS Oral Take 1 tablet by mouth every 12 (twelve) hours. 14 tablet 0  . DESVENLAFAXINE SUCCINATE ER 100 MG PO TB24 Oral Take 100 mg by mouth every morning.     Marland Kitchen  DSS 100 MG PO CAPS Oral Take 100 mg by mouth 2 (two) times daily. 60 capsule 2  . HYDROMORPHONE HCL 2 MG PO TABS Oral Take 1 tablet (2 mg total) by mouth 2 (two) times daily. 30 minutes prior to dressing changes. 60 tablet 0  . HYDROXYZINE HCL 25 MG PO TABS Oral Take 25 mg by mouth 2 (two) times daily.    . IBUPROFEN 200 MG PO TABS Oral Take 400 mg by mouth every 8 (eight) hours as needed. pain    . LISINOPRIL-HYDROCHLOROTHIAZIDE 10-12.5 MG PO TABS Oral Take 1 tablet by mouth every morning.     . OXYCODONE HCL 5 MG PO CAPS Oral Take 5 mg by mouth every 4 (four) hours as needed.      BP 136/93  Pulse 68   Temp 98.9 F (37.2 C) (Oral)  Resp 20  Ht 5\' 3"  (1.6 m)  Wt 246 lb (111.585 kg)  BMI 43.58 kg/m2  SpO2 96%  LMP 11/07/2011  Physical Exam  Nursing note and vitals reviewed. Constitutional: She is oriented to person, place, and time. She appears well-developed and well-nourished. No distress.  HENT:  Head: Normocephalic and atraumatic.  Mouth/Throat: Oropharynx is clear and moist. Mucous membranes are dry.  Eyes: Conjunctivae and EOM are normal. Pupils are equal, round, and reactive to light.  Neck: Normal range of motion. Neck supple.  Cardiovascular: Normal rate, regular rhythm and intact distal pulses.   No murmur heard. Pulmonary/Chest: Effort normal and breath sounds normal. No respiratory distress. She has no wheezes. She has no rales.  Abdominal: Soft. She exhibits distension. Bowel sounds are absent. There is tenderness in the left lower quadrant. There is no rebound and no guarding. No hernia.       Healing midline surgical incision. Skin is pink without any surrounding erythema or fluctuance  Musculoskeletal: Normal range of motion. She exhibits no edema and no tenderness.  Neurological: She is alert and oriented to person, place, and time.  Skin: Skin is warm and dry. No rash noted. No erythema.  Psychiatric: She has a normal mood and affect. Her behavior is normal.    ED Course  Procedures (including critical care time)  Labs Reviewed  CBC WITH DIFFERENTIAL - Abnormal; Notable for the following:    Hemoglobin 10.2 (*)     HCT 30.9 (*)     MCH 25.8 (*)     Platelets 455 (*)     All other components within normal limits  COMPREHENSIVE METABOLIC PANEL - Abnormal; Notable for the following:    Glucose, Bld 109 (*)     BUN 4 (*)     All other components within normal limits  URINALYSIS, ROUTINE W REFLEX MICROSCOPIC - Abnormal; Notable for the following:    pH 8.5 (*)     All other components within normal limits  LIPASE, BLOOD  LACTIC ACID, PLASMA   Ct Abdomen  Pelvis W Contrast  11/15/2011  *RADIOLOGY REPORT*  Clinical Data: Abdominal pain.  Surgery on 10/26/2011 for partial colectomy for diverticulitis.  Prior appendectomy, tubal ligation.  CT ABDOMEN AND PELVIS WITH CONTRAST  Technique:  Multidetector CT imaging of the abdomen and pelvis was performed following the standard protocol during bolus administration of intravenous contrast.  Contrast: 20mL OMNIPAQUE IOHEXOL 300 MG/ML  SOLN, OMNIPAQUE IOHEXOL 300 MG/ML  SOLN  Comparison: CT of the abdomen pelvis 09/30/2010  Findings: Lung bases are unremarkable in appearance.  There is a suture line in the sigmoid colon,  consistent with history of partial colectomy.  Surrounding this region, there is significant mesenteric stranding.  There is stranding surrounding the descending colon and within the segment there are numerous diverticula. Colonic wall is thickened in the descending sigmoid colon.  Findings may represent diverticulitis or postoperative inflammation or infection.  Within the left paracolic region at the level of the splenic flexure, there is a small nodule, likely representing a lymph node.  This measures 12 mm in diameter and is best seen on image number 32.  Findings may be reactive.  No focal abnormality identified within the liver, spleen, pancreas, adrenal glands, or kidneys.  The gallbladder is present. Postoperative changes are seen in the anterior abdominal wall.  The stomach and small bowel loops are normal in appearance. Surgical clip is identified in the right lower quadrant consistent with previous appendectomy.  The uterus is present.  Bilateral tubal ligation clips are present. There is free pelvic fluid.  No adnexal mass. Visualized osseous structures have a normal appearance.  IMPRESSION:  1.  Colonic thickening and pericolonic stranding involving the descending and sigmoid colon.  Postoperative changes are identified in the sigmoid colon.  Considerations include diverticulitis or  postoperative inflammation/infection. 2.  Probable reactive lymph node in the mesentery. 3.  Free pelvic fluid.  Original Report Authenticated By: Patterson Hammersmith, M.D.   Dg Abd Acute W/chest  11/17/2011  *RADIOLOGY REPORT*  Clinical Data:  Abdominal pain, rule out obstruction  ACUTE ABDOMEN SERIES (ABDOMEN 2 VIEW & CHEST 1 VIEW)  Comparison: CT scan of the abdomen pelvis 11/15/2011, barium enema 09/26/2011  Findings: The lungs are clear.  No edema, or focal airspace consolidation.  No pneumothorax or pleural effusion.  Cardiac and mediastinal contours within normal limits.  No free air on the upright view.  Unremarkable bowel gas pattern. Gas, stool and a small amount of residual contrast medium noted throughout the colon from the cecum to the rectum.  Clips project over the anatomic pelvis consistent with prior bilateral tubal ligation.  The osseous structures are intact, and unremarkable.  IMPRESSION:  1.  No acute cardiopulmonary disease. 2.  Unremarkable, nonobstructed bowel gas pattern.  Original Report Authenticated By: Vilma Prader     No diagnosis found.    MDM   Patient with a history of a hemicolectomy due to diverticulitis with primary anastomosis on 710 who is here 2 days ago and had a CT scan that showed colonic thickening and pericolonic stranding that could be postoperative changes versus infection. At that time patient had a normal white blood cell count and surgery felt that it was most likely normal postop shoe was restarted on Augmentin. Within the last 24 hours patient started vomiting and since that time has not been able to pass gas or have a bowel movement. She denies any fever but has had active vomiting in the room. On exam patient has no bowel sounds and dry mucous membranes but otherwise unremarkable exam. Her midline abdominal wound is healing appropriately. Due to recent CT scan I will do a plain abdominal film and give patient IV fluids and recheck CBC, CMP and lipase. Patient  states she's never had a problem taking Augmentin in the past and it never made her nauseated the prior time she was on it.   6:41 PM All labs including lactate are within normal limits. Acute abdominal series is within normal limits with normal bowel gas pattern. Findings were discussed with the patient and will try by mouth challenge   8:08  PM Still nauseated but no further vomiting.  Phenergan given.  8:49 PM Pt feels much better and tolerated pos.  Will d/c home.  Gwyneth Sprout, MD 11/17/11 2049

## 2011-11-21 NOTE — Discharge Summary (Signed)
agree

## 2011-11-22 ENCOUNTER — Telehealth (INDEPENDENT_AMBULATORY_CARE_PROVIDER_SITE_OTHER): Payer: Self-pay | Admitting: General Surgery

## 2011-11-22 ENCOUNTER — Telehealth (INDEPENDENT_AMBULATORY_CARE_PROVIDER_SITE_OTHER): Payer: Self-pay

## 2011-11-22 NOTE — Telephone Encounter (Signed)
Pt calling for refill of Percocet.  She is still having pain.

## 2011-11-22 NOTE — Telephone Encounter (Signed)
FYIMarylene [name], nurse with [facility], calling to report on pt.  Pt told nurse today (Tuesday) that she ran temperature 101.2 on Sunday night only, none since.  She is currently taking Amoxicillin.  Wound care revealed 100% pink tissue in the wound bed, but small amount of purulent drainage on the dressing.  No odor from wound.  Nurse will return on Thursday to reassess the dressing, and pt has F/U appt on Monday.

## 2011-11-23 ENCOUNTER — Telehealth (INDEPENDENT_AMBULATORY_CARE_PROVIDER_SITE_OTHER): Payer: Self-pay | Admitting: General Surgery

## 2011-11-23 NOTE — Telephone Encounter (Signed)
Called pt to let her know that is a written Rx for Percocet 5/325 1-2 po q 4hrs prn for pain #40 without refills.

## 2011-11-28 ENCOUNTER — Ambulatory Visit (INDEPENDENT_AMBULATORY_CARE_PROVIDER_SITE_OTHER): Payer: Medicaid Other | Admitting: Surgery

## 2011-11-28 ENCOUNTER — Encounter (INDEPENDENT_AMBULATORY_CARE_PROVIDER_SITE_OTHER): Payer: Self-pay | Admitting: Surgery

## 2011-11-28 VITALS — BP 128/84 | HR 100 | Temp 97.8°F | Ht 62.75 in | Wt 249.6 lb

## 2011-11-28 DIAGNOSIS — Z09 Encounter for follow-up examination after completed treatment for conditions other than malignant neoplasm: Secondary | ICD-10-CM

## 2011-11-28 NOTE — Progress Notes (Signed)
Subjective:     Patient ID: Kara [name], female   DOB: Sep 06, 1980, 31 y.o.   MRN: [id number]  HPI She is here for another wound check. She is doing much better now and is eating well and her bowels. She denies fevers. She is doing wet-to-dry dressing changes with home health twice daily  Review of Systems     Objective:   Physical Exam She looks great today. Her incision is healing very well. It is much smaller. There is excellent granulation tissue which I treated with silver nitrate.    Assessment:     Patient status post partial colectomy with open wound from wound infection    Plan:     She will still refrain from heavy lifting. She will continue her wound care and I will see her back in 3 weeks

## 2011-12-05 ENCOUNTER — Telehealth (INDEPENDENT_AMBULATORY_CARE_PROVIDER_SITE_OTHER): Payer: Self-pay

## 2011-12-05 NOTE — Telephone Encounter (Signed)
Patient called requesting percocet refill. Patient had lap colectomy on 10/26/11. Seen in ER with several complications postoperatively. Next appointment is on 12/21/11. Dr. Magnus Ivan of today so will have urge doctor review and will call patient back this afternoon.

## 2011-12-05 NOTE — Telephone Encounter (Signed)
I discussed this with Dr Ezzard Standing and he said defer to this to Dr Magnus Ivan in the am when he is here.  I called the pt to let her know that we will ask Dr Magnus Ivan 1st thing when he gets here if he will fill this.  She states Ibuprofen is not helping.  She took her last Percocets Friday pm.  She is also taking the Dilaudid for her dressing changes that she was prescribed in the hospital.  I told her we will call her in the am.

## 2011-12-05 NOTE — Telephone Encounter (Signed)
Kara [name], nurse with [facility], called to report this pt has been taking Dilaudid 2 mg Q4H, since Friday.  Nurse is aware the pt called earlier today for med refill and wanted MD to be aware.

## 2011-12-06 ENCOUNTER — Telehealth (INDEPENDENT_AMBULATORY_CARE_PROVIDER_SITE_OTHER): Payer: Self-pay | Admitting: General Surgery

## 2011-12-06 NOTE — Telephone Encounter (Signed)
Called Ms Leclere and told her that she has a written Rx for Percocet 5/325 1-2 po q 4hr prn for pain #30 without refills. Written by Dr Magnus Ivan

## 2011-12-13 ENCOUNTER — Telehealth (INDEPENDENT_AMBULATORY_CARE_PROVIDER_SITE_OTHER): Payer: Self-pay | Admitting: General Surgery

## 2011-12-13 NOTE — Telephone Encounter (Signed)
Called pt back to let her know that Dr Magnus Ivan written a Rx for Percocet 5/325 1-2 po q 4hrs #25 with out refills and pt is aware that this will be her last Rx for Percocet per Dr Magnus Ivan and if she still have pain that she can take tylenol for her pain

## 2011-12-13 NOTE — Telephone Encounter (Signed)
Pt calling for more Percocet.  She states she still has an open wound and a lot of pain.  Has only 2 pills left.  Please call her with plan.

## 2011-12-21 ENCOUNTER — Encounter (INDEPENDENT_AMBULATORY_CARE_PROVIDER_SITE_OTHER): Payer: Self-pay | Admitting: Surgery

## 2011-12-21 ENCOUNTER — Ambulatory Visit (INDEPENDENT_AMBULATORY_CARE_PROVIDER_SITE_OTHER): Payer: Medicaid Other | Admitting: Surgery

## 2011-12-21 VITALS — BP 128/80 | HR 83 | Temp 98.6°F | Resp 16 | Ht 62.0 in | Wt 248.4 lb

## 2011-12-21 DIAGNOSIS — Z09 Encounter for follow-up examination after completed treatment for conditions other than malignant neoplasm: Secondary | ICD-10-CM

## 2011-12-21 NOTE — Progress Notes (Signed)
Subjective:     Patient ID: Kara [name], female   DOB: Nov 05, 1980, 31 y.o.   MRN: [id number]  HPI She is here for another postoperative visit status post partial colectomy on July 10 for recurrent diverticulitis. She has been undergoing wet-to-dry dressing changes to her midline wound. She is doing well Review of Systems     Objective:   Physical Exam On exam, her abdomen is soft and her incision is completely healed    Assessment:     Condition stable postop    Plan:     She may return to normal activity. I will see her back as needed

## 2012-08-23 ENCOUNTER — Emergency Department (HOSPITAL_COMMUNITY): Payer: Medicaid Other

## 2012-08-23 ENCOUNTER — Emergency Department (HOSPITAL_COMMUNITY)
Admission: EM | Admit: 2012-08-23 | Discharge: 2012-08-23 | Disposition: A | Discharge status: Home / Self Care | Payer: Medicaid Other | Attending: Emergency Medicine | Admitting: Emergency Medicine

## 2012-08-23 ENCOUNTER — Encounter (HOSPITAL_COMMUNITY): Payer: Self-pay | Admitting: Nurse Practitioner

## 2012-08-23 DIAGNOSIS — Z87891 Personal history of nicotine dependence: Secondary | ICD-10-CM | POA: Insufficient documentation

## 2012-08-23 DIAGNOSIS — Z8719 Personal history of other diseases of the digestive system: Secondary | ICD-10-CM | POA: Insufficient documentation

## 2012-08-23 DIAGNOSIS — H538 Other visual disturbances: Secondary | ICD-10-CM | POA: Insufficient documentation

## 2012-08-23 DIAGNOSIS — R739 Hyperglycemia, unspecified: Secondary | ICD-10-CM

## 2012-08-23 DIAGNOSIS — E119 Type 2 diabetes mellitus without complications: Secondary | ICD-10-CM | POA: Insufficient documentation

## 2012-08-23 DIAGNOSIS — R209 Unspecified disturbances of skin sensation: Secondary | ICD-10-CM | POA: Insufficient documentation

## 2012-08-23 DIAGNOSIS — Z8489 Family history of other specified conditions: Secondary | ICD-10-CM | POA: Insufficient documentation

## 2012-08-23 DIAGNOSIS — R42 Dizziness and giddiness: Secondary | ICD-10-CM | POA: Insufficient documentation

## 2012-08-23 DIAGNOSIS — Z8742 Personal history of other diseases of the female genital tract: Secondary | ICD-10-CM | POA: Insufficient documentation

## 2012-08-23 DIAGNOSIS — Z79899 Other long term (current) drug therapy: Secondary | ICD-10-CM | POA: Insufficient documentation

## 2012-08-23 DIAGNOSIS — F3289 Other specified depressive episodes: Secondary | ICD-10-CM | POA: Insufficient documentation

## 2012-08-23 DIAGNOSIS — F329 Major depressive disorder, single episode, unspecified: Secondary | ICD-10-CM | POA: Insufficient documentation

## 2012-08-23 DIAGNOSIS — Z885 Allergy status to narcotic agent status: Secondary | ICD-10-CM | POA: Insufficient documentation

## 2012-08-23 DIAGNOSIS — Z7982 Long term (current) use of aspirin: Secondary | ICD-10-CM | POA: Insufficient documentation

## 2012-08-23 DIAGNOSIS — D649 Anemia, unspecified: Secondary | ICD-10-CM | POA: Insufficient documentation

## 2012-08-23 DIAGNOSIS — R51 Headache: Secondary | ICD-10-CM | POA: Insufficient documentation

## 2012-08-23 DIAGNOSIS — Z882 Allergy status to sulfonamides status: Secondary | ICD-10-CM | POA: Insufficient documentation

## 2012-08-23 DIAGNOSIS — E875 Hyperkalemia: Secondary | ICD-10-CM | POA: Insufficient documentation

## 2012-08-23 DIAGNOSIS — F411 Generalized anxiety disorder: Secondary | ICD-10-CM | POA: Insufficient documentation

## 2012-08-23 DIAGNOSIS — I10 Essential (primary) hypertension: Secondary | ICD-10-CM | POA: Insufficient documentation

## 2012-08-23 HISTORY — DX: Type 2 diabetes mellitus without complications: E11.9

## 2012-08-23 LAB — URINALYSIS, ROUTINE W REFLEX MICROSCOPIC
Ketones, ur: NEGATIVE mg/dL
Leukocytes, UA: NEGATIVE
Nitrite: NEGATIVE
pH: 6.5 (ref 5.0–8.0)

## 2012-08-23 LAB — POCT I-STAT, CHEM 8
Calcium, Ion: 1.15 mmol/L (ref 1.12–1.23)
Glucose, Bld: 106 mg/dL — ABNORMAL HIGH (ref 70–99)
HCT: 32 % — ABNORMAL LOW (ref 36.0–46.0)
Hemoglobin: 10.9 g/dL — ABNORMAL LOW (ref 12.0–15.0)

## 2012-08-23 LAB — CBC WITH DIFFERENTIAL/PLATELET
Basophils Relative: 0 % (ref 0–1)
Eosinophils Relative: 2 % (ref 0–5)
Hemoglobin: 11.5 g/dL — ABNORMAL LOW (ref 12.0–15.0)
Lymphocytes Relative: 36 % (ref 12–46)
MCH: 23.7 pg — ABNORMAL LOW (ref 26.0–34.0)
Monocytes Absolute: 0.5 10*3/uL (ref 0.1–1.0)
Monocytes Relative: 7 % (ref 3–12)
Neutrophils Relative %: 55 % (ref 43–77)
Platelets: 164 10*3/uL (ref 150–400)
RBC: 4.85 MIL/uL (ref 3.87–5.11)
WBC: 7.2 10*3/uL (ref 4.0–10.5)

## 2012-08-23 LAB — BASIC METABOLIC PANEL
Calcium: 9.2 mg/dL (ref 8.4–10.5)
Chloride: 101 mEq/L (ref 96–112)
Creatinine, Ser: 0.58 mg/dL (ref 0.50–1.10)
GFR calc Af Amer: >90 mL/min (ref >90)
Sodium: 134 mEq/L — ABNORMAL LOW (ref 135–145)

## 2012-08-23 MED ORDER — SODIUM CHLORIDE 0.9 % IV BOLUS (SEPSIS)
1000.0000 mL | Freq: Once | INTRAVENOUS | Status: AC
  Administered 2012-08-23: 1000 mL via INTRAVENOUS

## 2012-08-23 MED ORDER — BUTALBITAL-APAP-CAFFEINE 50-325-40 MG PO TABS: 1.0000 | ORAL_TABLET | Freq: Three times a day (TID) | ORAL | Status: AC | PRN

## 2012-08-23 MED ORDER — INSULIN ASPART 100 UNIT/ML ~~LOC~~ SOLN
10.0000 [IU] | Freq: Once | SUBCUTANEOUS | Status: AC
  Administered 2012-08-23: 10 [IU] via INTRAVENOUS
  Filled 2012-08-23: qty 1

## 2012-08-23 MED ORDER — LORAZEPAM 2 MG/ML IJ SOLN
1.0000 mg | Freq: Once | INTRAMUSCULAR | Status: AC
  Administered 2012-08-23: 1 mg via INTRAVENOUS
  Filled 2012-08-23: qty 1

## 2012-08-23 NOTE — ED Notes (Signed)
Per EMS pt has hx of hypertension, ran out of BP medications 2 days ago. appx 12pm today pt had headache around 1450 pt was practicing CPR in class and had an episode of "muffled hearing" and posterior head pressure. Pt sts also having numbness and tingling around corners of mouth. Denies dizziness, blurred vision, CP and SOB. Pt anxious and tearful. intial BP 220/108 last BP 180/110

## 2012-08-23 NOTE — ED Provider Notes (Signed)
History     CSN: [id number]  Arrival date & time 08/23/12  1604   First MD Initiated Contact with Patient 08/23/12 1605      Chief Complaint  Patient presents with  . Hypertension    (Consider location/radiation/quality/duration/timing/severity/associated sxs/prior treatment) HPI  Patient presents with multiple concerns. She states that today, approximately 2 hours prior to my evaluation she had episode of head pressure with numbness and tingling in her face, as well as blurred vision. She had no dyspnea or chest pain. Symptoms improved spontaneously, minimally. There were no clear exacerbating factors.  Notably, when the patient was symptomatic her blood pressure was taken, found to be substantially elevated, with a systolic greater than 200, diastolic greater than 100.  On my exam the patient continues to complain of a head pressure sensation, mild dysesthesia about the corners of her mouth, mild tingling in both hands. She denies current chest pain, dyspnea, confusion, disorientation.   Past Medical History  Diagnosis Date  . Depression   . Anxiety   . Anemia     no current med.  . Diverticular disease of colon   . 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 Vaginal cyst 08/2011    current antibiotic, will finish 08/31/2011  . Hypertension     under control, has been on med. > 4 yrs.  . Family history of anesthesia complication     great aunt had hallucinations after IV med administered during anesthesia  . Endometriosis   . Diabetes mellitus without complication     Past Surgical History  Procedure Laterality Date  . Cesarean section  2004, 01/30/2006  . Appendectomy    . Tubal ligation  07/28/2008    laparoscopic  . Carpal tunnel release  08/23/2011    Procedure: CARPAL TUNNEL RELEASE;  Surgeon: Tami Ribas, MD;  Location: [facility];  Service: Orthopedics;  Laterality: Right;  . Colon surgery  10/26/11    lap partial colectomy    Family  History  Problem Relation Age of Onset  . Cancer Maternal Grandfather     lung  . Hypertension Mother   . Hypertension Father   . Seizures Father   . Hypertension Brother     History  Substance Use Topics  . Smoking status: Former Games developer  . Smokeless tobacco: Never Used     Comment: can't remember when she quit smoking  . Alcohol Use: No    OB History   Grav Para Term Preterm Abortions TAB SAB Ect Mult Living   6 2 2  3  3   2       Review of Systems  Constitutional:       Per HPI, otherwise negative  HENT:       Per HPI, otherwise negative  Respiratory:       Per HPI, otherwise negative  Cardiovascular:       Per HPI, otherwise negative  Gastrointestinal: Negative for vomiting.  Endocrine:       Negative aside from HPI  Genitourinary:       Neg aside from HPI   Musculoskeletal:       Per HPI, otherwise negative  Skin: Negative.   Neurological: Negative for syncope.    Allergies  Bee venom; Citrus; Pepperoni; Pineapple; Banana; Betadine; Codeine; and Sulfa antibiotics  Home Medications   Current Outpatient Rx  Name  Route  Sig  Dispense  Refill  . aspirin-acetaminophen-caffeine (EXCEDRIN MIGRAINE) 250-250-65 MG per tablet   Oral  Take 2 tablets by mouth daily as needed for pain.         . cetirizine (ZYRTEC) 10 MG tablet   Oral   Take 10 mg by mouth daily as needed for allergies.         Marland Kitchen EPINEPHrine (EPIPEN) 0.3 mg/0.3 mL DEVI   Intramuscular   Inject 0.3 mg into the muscle once as needed (for allergic reaction).         Marland Kitchen lisinopril-hydrochlorothiazide (PRINZIDE,ZESTORETIC) 10-12.5 MG per tablet   Oral   Take 1 tablet by mouth every morning.          . metFORMIN (GLUCOPHAGE) 500 MG tablet   Oral   Take 500 mg by mouth 2 (two) times daily with a meal.         . naproxen (NAPROSYN) 500 MG tablet   Oral   Take 500 mg by mouth 2 (two) times daily as needed (for pain).         Marland Kitchen omeprazole (PRILOSEC) 20 MG capsule   Oral   Take 20  mg by mouth daily.           BP 151/83  Pulse 83  Temp(Src) 98.3 F (36.8 C) (Oral)  Resp 18  SpO2 100%  LMP 07/18/2012  Physical Exam  Nursing note and vitals reviewed. Constitutional: She is oriented to person, place, and time. She appears well-developed and well-nourished. No distress.  HENT:  Head: Normocephalic and atraumatic.  Eyes: Conjunctivae and EOM are normal.  Cardiovascular: Normal rate and regular rhythm.   Pulmonary/Chest: Effort normal and breath sounds normal. No stridor. No respiratory distress.  Abdominal: She exhibits no distension.  Musculoskeletal: She exhibits no edema.  Neurological: She is alert and oriented to person, place, and time. No cranial nerve deficit.  Skin: Skin is warm and dry.  Psychiatric: She has a normal mood and affect.    ED Course  Procedures (including critical care time)  Labs Reviewed  CBC WITH DIFFERENTIAL  URINALYSIS, ROUTINE W REFLEX MICROSCOPIC  BASIC METABOLIC PANEL   No results found.   No diagnosis found.  O2- 99%ra, normal  BP 150/90 on my exam  9:57 PM Patient remains in no distress. Repeat chem is notable for eukalemia.    Date: 08/23/2012  Rate: 86  Rhythm: normal sinus rhythm  QRS Axis: normal  Intervals: normal  ST/T Wave abnormalities: nonspecific T wave changes  Conduction Disutrbances:none  Narrative Interpretation:   Old EKG Reviewed: changes noted Artefact, but essentially similar  abnormal   MDM  The patient presents after an episode of lightheadedness, hearing changes, head pressure, and elevated blood pressure.  By the time of my evaluation the patient's blood pressure has improved substantially, but complained of subjective abnormalities, which are not reproducible. The patient did have notable hyperglycemia, and given her hyperkalemia she received IV fluids, insulin. .  Patient's ED course remained hemodynamically stable, with no significant bump in her blood pressure, nor  evidence of distress. After improvement of her potassium, and with the aforementioned absence of notable findings, she is discharged in stable condition to follow up with her primary care physician.        Gerhard Munch, MD 08/23/12 2201

## 2012-08-23 NOTE — ED Notes (Signed)
Pt c/o chest pain when coughing. Denies any radiation or cp at present time. Pt sts numbness and tingling around lips are gone. Pt resting appears lethargic.

## 2012-09-04 ENCOUNTER — Ambulatory Visit: Payer: Medicaid Other

## 2012-09-11 ENCOUNTER — Emergency Department (HOSPITAL_BASED_OUTPATIENT_CLINIC_OR_DEPARTMENT_OTHER)
Admission: EM | Admit: 2012-09-11 | Discharge: 2012-09-11 | Disposition: A | Discharge status: Home / Self Care | Payer: Medicaid Other | Attending: Emergency Medicine | Admitting: Emergency Medicine

## 2012-09-11 ENCOUNTER — Emergency Department (HOSPITAL_BASED_OUTPATIENT_CLINIC_OR_DEPARTMENT_OTHER): Payer: Medicaid Other

## 2012-09-11 ENCOUNTER — Encounter (HOSPITAL_BASED_OUTPATIENT_CLINIC_OR_DEPARTMENT_OTHER): Payer: Self-pay | Admitting: *Deleted

## 2012-09-11 ENCOUNTER — Emergency Department (HOSPITAL_COMMUNITY)
Admission: EM | Admit: 2012-09-11 | Discharge: 2012-09-11 | Disposition: A | Discharge status: Home / Self Care | Payer: Medicaid Other | Source: Home / Self Care

## 2012-09-11 DIAGNOSIS — Z8659 Personal history of other mental and behavioral disorders: Secondary | ICD-10-CM | POA: Insufficient documentation

## 2012-09-11 DIAGNOSIS — Z8742 Personal history of other diseases of the female genital tract: Secondary | ICD-10-CM | POA: Insufficient documentation

## 2012-09-11 DIAGNOSIS — Z87891 Personal history of nicotine dependence: Secondary | ICD-10-CM | POA: Insufficient documentation

## 2012-09-11 DIAGNOSIS — Z8669 Personal history of other diseases of the nervous system and sense organs: Secondary | ICD-10-CM | POA: Insufficient documentation

## 2012-09-11 DIAGNOSIS — E119 Type 2 diabetes mellitus without complications: Secondary | ICD-10-CM | POA: Insufficient documentation

## 2012-09-11 DIAGNOSIS — Z3202 Encounter for pregnancy test, result negative: Secondary | ICD-10-CM | POA: Insufficient documentation

## 2012-09-11 DIAGNOSIS — R509 Fever, unspecified: Secondary | ICD-10-CM | POA: Insufficient documentation

## 2012-09-11 DIAGNOSIS — Z79899 Other long term (current) drug therapy: Secondary | ICD-10-CM | POA: Insufficient documentation

## 2012-09-11 DIAGNOSIS — R55 Syncope and collapse: Secondary | ICD-10-CM | POA: Insufficient documentation

## 2012-09-11 DIAGNOSIS — N390 Urinary tract infection, site not specified: Secondary | ICD-10-CM

## 2012-09-11 DIAGNOSIS — I10 Essential (primary) hypertension: Secondary | ICD-10-CM | POA: Insufficient documentation

## 2012-09-11 DIAGNOSIS — Z862 Personal history of diseases of the blood and blood-forming organs and certain disorders involving the immune mechanism: Secondary | ICD-10-CM | POA: Insufficient documentation

## 2012-09-11 DIAGNOSIS — Z8719 Personal history of other diseases of the digestive system: Secondary | ICD-10-CM | POA: Insufficient documentation

## 2012-09-11 DIAGNOSIS — R05 Cough: Secondary | ICD-10-CM | POA: Insufficient documentation

## 2012-09-11 DIAGNOSIS — R059 Cough, unspecified: Secondary | ICD-10-CM | POA: Insufficient documentation

## 2012-09-11 LAB — URINE MICROSCOPIC-ADD ON

## 2012-09-11 LAB — URINALYSIS, ROUTINE W REFLEX MICROSCOPIC
Hgb urine dipstick: NEGATIVE
Nitrite: NEGATIVE
Protein, ur: NEGATIVE mg/dL
Specific Gravity, Urine: 1.03 (ref 1.005–1.030)
Urobilinogen, UA: 1 mg/dL (ref 0.0–1.0)

## 2012-09-11 LAB — CBC WITH DIFFERENTIAL/PLATELET
Basophils Relative: 1 % (ref 0–1)
Eosinophils Absolute: 0.4 10*3/uL (ref 0.0–0.7)
Eosinophils Relative: 5 % (ref 0–5)
MCH: 23.9 pg — ABNORMAL LOW (ref 26.0–34.0)
MCHC: 32.8 g/dL (ref 30.0–36.0)
MCV: 72.9 fL — ABNORMAL LOW (ref 78.0–100.0)
Monocytes Relative: 11 % (ref 3–12)
Neutrophils Relative %: 61 % (ref 43–77)
Platelets: 439 10*3/uL — ABNORMAL HIGH (ref 150–400)

## 2012-09-11 LAB — D-DIMER, QUANTITATIVE: D-Dimer, Quant: 1.57 ug/mL-FEU — ABNORMAL HIGH (ref 0.00–0.48)

## 2012-09-11 LAB — BASIC METABOLIC PANEL
BUN: 12 mg/dL (ref 6–23)
Calcium: 9.4 mg/dL (ref 8.4–10.5)
GFR calc Af Amer: >90 mL/min (ref >90)
GFR calc non Af Amer: >90 mL/min (ref >90)
Potassium: 3.6 mEq/L (ref 3.5–5.1)
Sodium: 133 mEq/L — ABNORMAL LOW (ref 135–145)

## 2012-09-11 MED ORDER — CEPHALEXIN 500 MG PO CAPS: 500.0000 mg | ORAL_CAPSULE | Freq: Four times a day (QID) | ORAL | Status: AC

## 2012-09-11 MED ORDER — IOHEXOL 350 MG/ML SOLN
100.0000 mL | Freq: Once | INTRAVENOUS | Status: AC | PRN
  Administered 2012-09-11: 100 mL via INTRAVENOUS

## 2012-09-11 NOTE — ED Notes (Signed)
Pt. Children in room with Pt.   Pt. Allowing children to act out and children given explanation by RN why they need to sit down and stay in the room with their mother.  Pt. Daughter went to restroom and played in water causing floor and walls and entire Bathroom to have to be mopped.  Pt. Made aware by RN about need to be with children when they visit the restroom.

## 2012-09-11 NOTE — ED Notes (Addendum)
Patient states she has sinus congestion 3 days ago.  Now has developed into non productive cough, sob and mid back pain.  Has used otc cold meds with no relief. Fever of 101 last night. States last night after getting upset by her children, she fainted.  Was evaluated by EMS and blodd sugar was elevated to 260 and has continued to stay up.

## 2012-09-11 NOTE — ED Provider Notes (Signed)
History     CSN: [id number]  Arrival date & time 09/11/12  1633   First MD Initiated Contact with Patient 09/11/12 1656      Chief Complaint  Patient presents with  . Shortness of Breath    (Consider location/radiation/quality/duration/timing/severity/associated sxs/prior treatment) HPI Comments: Pt states that she had a syncopal episode last night after getting very stressed with her children:pt states that the symptoms have been worse today;denies cp:pt states that she has taken multiple doses of benadryl  Patient is a 32 y.o. female presenting with shortness of breath. The history is provided by the patient. No language interpreter was used.  Shortness of Breath Severity:  Moderate Onset quality:  Gradual Timing:  Constant Progression:  Worsening Chronicity:  New Context: activity and URI   Relieved by:  Nothing Worsened by:  Nothing tried Ineffective treatments: benadryl. Associated symptoms: cough, fever and syncope   Associated symptoms: no abdominal pain and no chest pain     Past Medical History  Diagnosis Date  . Depression   . Anxiety   . Anemia     no current med.  . Diverticular disease of colon   . 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 Vaginal cyst 08/2011    current antibiotic, will finish 08/31/2011  . Hypertension     under control, has been on med. > 4 yrs.  . Family history of anesthesia complication     great aunt had hallucinations after IV med administered during anesthesia  . Endometriosis   . Diabetes mellitus without complication     Past Surgical History  Procedure Laterality Date  . Cesarean section  2004, 01/30/2006  . Appendectomy    . Tubal ligation  07/28/2008    laparoscopic  . Carpal tunnel release  08/23/2011    Procedure: CARPAL TUNNEL RELEASE;  Surgeon: Tami Ribas, MD;  [facility]  [facility];  Service: Orthopedics;  Laterality: Right;  . Colon surgery  10/26/11    lap partial colectomy    Family  History  Problem Relation Age of Onset  . Cancer Maternal Grandfather     lung  . Hypertension Mother   . Hypertension Father   . Seizures Father   . Hypertension Brother     History  Substance Use Topics  . Smoking status: Former Games developer  . Smokeless tobacco: Never Used     Comment: can't remember when she quit smoking  . Alcohol Use: No    OB History   Grav Para Term Preterm Abortions TAB SAB Ect Mult Living   6 2 2  3  3   2       Review of Systems  Constitutional: Positive for fever.  Respiratory: Positive for cough and shortness of breath.   Cardiovascular: Positive for syncope. Negative for chest pain.  Gastrointestinal: Negative for abdominal pain.    Allergies  Bee venom; Citrus; Pepperoni; Pineapple; Banana; Betadine; Codeine; and Sulfa antibiotics  Home Medications   Current Outpatient Rx  Name  Route  Sig  Dispense  Refill  . dextrose 5 % SOLN 100 mL with doxycycline 100 MG SOLR   Intravenous   Inject into the vein.         . butalbital-acetaminophen-caffeine (FIORICET) 50-325-40 MG per tablet   Oral   Take 1-2 tablets by mouth every 8 (eight) hours as needed for headache.   20 tablet   0   . cetirizine (ZYRTEC) 10 MG tablet   Oral   Take  10 mg by mouth daily as needed for allergies.         Marland Kitchen EPINEPHrine (EPIPEN) 0.3 mg/0.3 mL DEVI   Intramuscular   Inject 0.3 mg into the muscle once as needed (for allergic reaction).         Marland Kitchen lisinopril-hydrochlorothiazide (PRINZIDE,ZESTORETIC) 10-12.5 MG per tablet   Oral   Take 1 tablet by mouth every morning.          . metFORMIN (GLUCOPHAGE) 500 MG tablet   Oral   Take 500 mg by mouth 2 (two) times daily with a meal.         . naproxen (NAPROSYN) 500 MG tablet   Oral   Take 500 mg by mouth 2 (two) times daily as needed (for pain).         Marland Kitchen omeprazole (PRILOSEC) 20 MG capsule   Oral   Take 20 mg by mouth daily.           BP 149/82  Temp(Src) 99.5 F (37.5 C) (Oral)  Resp 22   Ht 5\' 3"  (1.6 m)  Wt 280 lb (127.007 kg)  BMI 49.61 kg/m2  SpO2 96%  LMP 07/18/2012  Physical Exam  Nursing note and vitals reviewed. Constitutional: She is oriented to person, place, and time. She appears well-developed and well-nourished.  HENT:  Right Ear: External ear normal.  Left Ear: External ear normal.  Mouth/Throat: Oropharynx is clear and moist.  Rhinorrhea noted  Eyes: Conjunctivae and EOM are normal.  Neck: Neck supple.  Cardiovascular: Normal rate and regular rhythm.   Pulmonary/Chest: Effort normal and breath sounds normal.  Pt sob after ambulating from the waiting room  Abdominal: Soft. Bowel sounds are normal.  Musculoskeletal: Normal range of motion.  Neurological: She is alert and oriented to person, place, and time.  Skin: Skin is warm and dry.  Psychiatric: She has a normal mood and affect.    ED Course  Procedures (including critical care time)  Labs Reviewed  GLUCOSE, CAPILLARY - Abnormal; Notable for the following:    Glucose-Capillary 125 (*)    All other components within normal limits  CBC WITH DIFFERENTIAL - Abnormal; Notable for the following:    Hemoglobin 10.7 (*)    HCT 32.6 (*)    MCV 72.9 (*)    MCH 23.9 (*)    RDW 20.3 (*)    Platelets 439 (*)    All other components within normal limits  BASIC METABOLIC PANEL - Abnormal; Notable for the following:    Sodium 133 (*)    Glucose, Bld 139 (*)    All other components within normal limits  URINALYSIS, ROUTINE W REFLEX MICROSCOPIC - Abnormal; Notable for the following:    Color, Urine AMBER (*)    APPearance CLOUDY (*)    Bilirubin Urine SMALL (*)    Ketones, ur 15 (*)    Leukocytes, UA MODERATE (*)    All other components within normal limits  D-DIMER, QUANTITATIVE - Abnormal; Notable for the following:    D-Dimer, Quant 1.57 (*)    All other components within normal limits  URINE MICROSCOPIC-ADD ON - Abnormal; Notable for the following:    Squamous Epithelial / LPF FEW (*)     Bacteria, UA FEW (*)    All other components within normal limits  URINE CULTURE  PREGNANCY, URINE   Dg Chest 2 View  09/11/2012   *RADIOLOGY REPORT*  Clinical Data: Fever, cough  CHEST - 2 VIEW  Comparison:  08/23/2012  Findings:  The heart size and mediastinal contours are within normal limits.  Both lungs are clear.  The visualized skeletal structures are unremarkable.  IMPRESSION: No active cardiopulmonary disease.   Original Report Authenticated By: Judie Petit. Miles Costain, M.D.     Date: 09/11/2012  Rate: 108  Rhythm: sinus tachycardia  QRS Axis: right  Intervals: normal  ST/T Wave abnormalities: normal  Conduction Disutrbances:none  Narrative Interpretation:   Old EKG Reviewed: changes noted     1. Syncope   2. UTI (lower urinary tract infection)       MDM  Pt is not pregnant and there is pe on ct scan:will treat for uti        Teressa Lower, NP 09/11/12 1918

## 2012-09-11 NOTE — ED Provider Notes (Signed)
  Medical screening examination/treatment/procedure(s) were performed by non-physician practitioner and as supervising physician I was immediately available for consultation/collaboration.    Seibert Keeter D Mikenzie Mccannon, MD 09/11/12 2356 

## 2012-09-13 LAB — URINE CULTURE

## 2012-10-16 ENCOUNTER — Encounter: Payer: Medicaid Other | Attending: Internal Medicine

## 2012-10-16 DIAGNOSIS — Z713 Dietary counseling and surveillance: Secondary | ICD-10-CM | POA: Insufficient documentation

## 2012-10-16 DIAGNOSIS — E119 Type 2 diabetes mellitus without complications: Secondary | ICD-10-CM | POA: Insufficient documentation

## 2012-10-23 NOTE — Progress Notes (Signed)
Patient was seen on 10/16/2012 for the first of a series of three diabetes self-management courses at the [facility]. Patient's most recent A1c was 8.2 % on (date not provided) The following learning objectives were met by the patient during this course:   Defines the role of glucose and insulin  Identifies type of diabetes and pathophysiology  Defines the diagnostic criteria for diabetes and prediabetes  States the risk factors for Type 2 Diabetes  States the symptoms of Type 2 Diabetes  Defines Type 2 Diabetes treatment goals  Defines Type 2 Diabetes treatment options  States the rationale for glucose monitoring  Identifies A1C, glucose targets, and testing times  Identifies proper sharps disposal  Defines the purpose of a diabetes food plan  Identifies carbohydrate food groups  Defines effects of carbohydrate foods on glucose levels  Identifies carbohydrate choices/grams/food labels  States benefits of physical activity and effect on glucose  Review of suggested activity guidelines  Handouts given during class include:  Type 2 Diabetes: Basics Book  My Food Plan Book  Food and Activity Log   Follow-Up Plan: Core Class 2

## 2012-10-23 NOTE — Progress Notes (Signed)
Patient attended 1st half of Core 1 Class, not entire 3 hours. Did not attend Carb Counting portion of the class.

## 2013-02-06 ENCOUNTER — Encounter (HOSPITAL_BASED_OUTPATIENT_CLINIC_OR_DEPARTMENT_OTHER): Payer: Self-pay | Admitting: Emergency Medicine

## 2013-02-06 ENCOUNTER — Emergency Department (HOSPITAL_BASED_OUTPATIENT_CLINIC_OR_DEPARTMENT_OTHER)
Admission: EM | Admit: 2013-02-06 | Discharge: 2013-02-06 | Disposition: A | Discharge status: Home / Self Care | Payer: Medicaid Other | Attending: Emergency Medicine | Admitting: Emergency Medicine

## 2013-02-06 DIAGNOSIS — E119 Type 2 diabetes mellitus without complications: Secondary | ICD-10-CM | POA: Insufficient documentation

## 2013-02-06 DIAGNOSIS — J029 Acute pharyngitis, unspecified: Secondary | ICD-10-CM | POA: Insufficient documentation

## 2013-02-06 DIAGNOSIS — N390 Urinary tract infection, site not specified: Secondary | ICD-10-CM | POA: Insufficient documentation

## 2013-02-06 DIAGNOSIS — Z8742 Personal history of other diseases of the female genital tract: Secondary | ICD-10-CM | POA: Insufficient documentation

## 2013-02-06 DIAGNOSIS — Z8669 Personal history of other diseases of the nervous system and sense organs: Secondary | ICD-10-CM | POA: Insufficient documentation

## 2013-02-06 DIAGNOSIS — Z8659 Personal history of other mental and behavioral disorders: Secondary | ICD-10-CM | POA: Insufficient documentation

## 2013-02-06 DIAGNOSIS — I10 Essential (primary) hypertension: Secondary | ICD-10-CM | POA: Insufficient documentation

## 2013-02-06 DIAGNOSIS — Z862 Personal history of diseases of the blood and blood-forming organs and certain disorders involving the immune mechanism: Secondary | ICD-10-CM | POA: Insufficient documentation

## 2013-02-06 DIAGNOSIS — Z3202 Encounter for pregnancy test, result negative: Secondary | ICD-10-CM | POA: Insufficient documentation

## 2013-02-06 DIAGNOSIS — Z79899 Other long term (current) drug therapy: Secondary | ICD-10-CM | POA: Insufficient documentation

## 2013-02-06 DIAGNOSIS — Z87891 Personal history of nicotine dependence: Secondary | ICD-10-CM | POA: Insufficient documentation

## 2013-02-06 DIAGNOSIS — Z8719 Personal history of other diseases of the digestive system: Secondary | ICD-10-CM | POA: Insufficient documentation

## 2013-02-06 LAB — URINALYSIS, ROUTINE W REFLEX MICROSCOPIC
Bilirubin Urine: NEGATIVE
Glucose, UA: >1000 mg/dL — AB
Hgb urine dipstick: NEGATIVE
Ketones, ur: NEGATIVE mg/dL
Nitrite: NEGATIVE
Protein, ur: NEGATIVE mg/dL
Specific Gravity, Urine: 1.031 — ABNORMAL HIGH (ref 1.005–1.030)
Urobilinogen, UA: 0.2 mg/dL (ref 0.0–1.0)
pH: 6 (ref 5.0–8.0)

## 2013-02-06 LAB — URINE MICROSCOPIC-ADD ON

## 2013-02-06 LAB — PREGNANCY, URINE: Preg Test, Ur: NEGATIVE

## 2013-02-06 MED ORDER — CEPHALEXIN 500 MG PO CAPS: 500.0000 mg | ORAL_CAPSULE | Freq: Three times a day (TID) | ORAL | Status: AC

## 2013-02-06 NOTE — ED Provider Notes (Signed)
CSN: [id number]     Arrival date & time 02/06/13  1113 History   First MD Initiated Contact with Patient 02/06/13 1137     Chief Complaint  Patient presents with  . Sore Throat    HPI  Patient has a lot of different complaints. Her main complaint today is her throat is sore and her boss when her to be checked. 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  She works around a lot of  elderly patients. States her boss was concerned that she "might be spreading strep". She had a sore throat yesterday morning and persisted today.  Onset and though she states that she had a colonic resection for recurrent diverticulitis. This was done in July of last year. 15 months ago. She noted wound infection. This healed. Intermittently since that time she gets mid low abdominal pain. This is primarily when her menses. She states she was told that there were uterine cells noted on her colon and was given a diagnosis at time of endometriosis. She's also had dysuria and frequency over the last 3-4 days.   Past Medical History  Diagnosis Date  . Depression   . Anxiety   . Anemia     no current med.  . Diverticular disease of colon   . 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 Vaginal cyst 08/2011    current antibiotic, will finish 08/31/2011  . Hypertension     under control, has been on med. > 4 yrs.  . Family history of anesthesia complication     great aunt had hallucinations after IV med administered during anesthesia  . Endometriosis   . Diabetes mellitus without complication    Past Surgical History  Procedure Laterality Date  . Cesarean section  2004, 01/30/2006  . Appendectomy    . Tubal ligation  07/28/2008    laparoscopic  . Carpal tunnel release  08/23/2011    Procedure: CARPAL TUNNEL RELEASE;  Surgeon: Tami Ribas, MD;  Location: [facility];  Service: Orthopedics;  Laterality: Right;  . Colon surgery  10/26/11    lap partial  colectomy   Family History  Problem Relation Age of Onset  . Cancer Maternal Grandfather     lung  . Hypertension Mother   . Hypertension Father   . Seizures Father   . Hypertension Brother    History  Substance Use Topics  . Smoking status: Former Games developer  . Smokeless tobacco: Never Used     Comment: can't remember when she quit smoking  . Alcohol Use: No   OB History   Grav Para Term Preterm Abortions TAB SAB Ect Mult Living   6 2 2  3  3   2      Review of Systems  Constitutional: Negative for fever, chills, diaphoresis, appetite change and fatigue.  HENT: Positive for sore throat. Negative for mouth sores and trouble swallowing.   Eyes: Negative for visual disturbance.  Respiratory: Negative for cough, chest tightness, shortness of breath and wheezing.   Cardiovascular: Negative for chest pain.  Gastrointestinal: Positive for abdominal pain. Negative for nausea, vomiting, diarrhea and abdominal distention.  Endocrine: Positive for polyuria. Negative for polydipsia and polyphagia.  Genitourinary: Positive for dysuria and frequency. Negative for hematuria.  Musculoskeletal: Negative for gait problem.  Skin: Negative for color change, pallor and rash.  Neurological: Negative for dizziness, syncope, light-headedness and headaches.  Hematological: Does not bruise/bleed easily.  Psychiatric/Behavioral: Negative for behavioral problems and confusion.    Allergies  Bee venom; Citrus; Pepperoni; Pineapple; Banana; Betadine; Codeine; and Sulfa antibiotics  Home Medications   Current Outpatient Rx  Name  Route  Sig  Dispense  Refill  . butalbital-acetaminophen-caffeine (FIORICET) 50-325-40 MG per tablet   Oral   Take 1-2 tablets by mouth every 8 (eight) hours as needed for headache.   20 tablet   0   . cephALEXin (KEFLEX) 500 MG capsule   Oral   Take 1 capsule (500 mg total) by mouth 4 (four) times daily.   10 capsule   0   . cephALEXin (KEFLEX) 500 MG capsule    Oral   Take 1 capsule (500 mg total) by mouth 3 (three) times daily.   30 capsule   0   . cetirizine (ZYRTEC) 10 MG tablet   Oral   Take 10 mg by mouth daily as needed for allergies.         Marland Kitchen dextrose 5 % SOLN 100 mL with doxycycline 100 MG SOLR   Intravenous   Inject into the vein.         Marland Kitchen EPINEPHrine (EPIPEN) 0.3 mg/0.3 mL DEVI   Intramuscular   Inject 0.3 mg into the muscle once as needed (for allergic reaction).         Marland Kitchen lisinopril-hydrochlorothiazide (PRINZIDE,ZESTORETIC) 10-12.5 MG per tablet   Oral   Take 1 tablet by mouth every morning.          . metFORMIN (GLUCOPHAGE) 500 MG tablet   Oral   Take 500 mg by mouth 2 (two) times daily with a meal.         . naproxen (NAPROSYN) 500 MG tablet   Oral   Take 500 mg by mouth 2 (two) times daily as needed (for pain).         Marland Kitchen omeprazole (PRILOSEC) 20 MG capsule   Oral   Take 20 mg by mouth daily.          BP 133/83  Pulse 114  Temp(Src) 98.3 F (36.8 C) (Oral)  Resp 20  Ht 5\' 3"  (1.6 m)  Wt 291 lb (131.997 kg)  BMI 51.56 kg/m2  SpO2 100%  LMP 01/14/2013 Physical Exam  Constitutional: She is oriented to person, place, and time. She appears well-developed and well-nourished. No distress.  HENT:  Head: Normocephalic.  Her pharynx appears normal. No erythema. No exudate.  Eyes: Conjunctivae are normal. Pupils are equal, round, and reactive to light. No scleral icterus.  Neck: Normal range of motion. Neck supple. No thyromegaly present.  Cardiovascular: Normal rate and regular rhythm.  Exam reveals no gallop and no friction rub.   No murmur heard. Pulmonary/Chest: Effort normal and breath sounds normal. No respiratory distress. She has no wheezes. She has no rales.  Abdominal: Soft. Bowel sounds are normal. She exhibits no distension. There is no tenderness. There is no rebound.  Abdomen is obese but soft. No tenderness. No peritoneal irritation. No suprapubic or left lower abdominal tenderness.   Musculoskeletal: Normal range of motion.  Neurological: She is alert and oriented to person, place, and time.  Skin: Skin is warm and dry. No rash noted.  Psychiatric: She has a normal mood and affect. Her behavior is normal.    ED Course  Procedures (including critical care time) Labs Review Labs Reviewed  URINALYSIS, ROUTINE W REFLEX MICROSCOPIC - Abnormal; Notable for the following:    Specific Gravity, Urine 1.031 (*)  Glucose, UA >1000 (*)    Leukocytes, UA TRACE (*)    All other components within normal limits  URINE MICROSCOPIC-ADD ON - Abnormal; Notable for the following:    Squamous Epithelial / LPF FEW (*)    Bacteria, UA FEW (*)    All other components within normal limits  RAPID STREP SCREEN  URINE CULTURE  CULTURE, GROUP A STREP  PREGNANCY, URINE   Imaging Review No results found.  EKG Interpretation   None       MDM   1. Pharyngitis   2. Urinary tract infection    Benign abdominal exam. She does have findings consistent with urinary tract infection symptoms as well. Not to diverticulitis clinically. Symptoms do sound like she may have some component of endometriosis. She had pathology of uterine cells are colonic resection.  Plan is symptomatic treatment for her throat with Tylenol or saltwater gargles. Keflex to cover her urinary tract infection. Primary care followup regarding her endometriosis.    Roney Marion, MD 02/06/13 1326

## 2013-02-06 NOTE — ED Notes (Addendum)
C/o sore throat started yesterday-also c/o large blood blots with LMP this month-vaginal itching and urinary frequency

## 2013-02-07 LAB — URINE CULTURE

## 2013-02-08 LAB — CULTURE, GROUP A STREP

## 2013-02-13 ENCOUNTER — Other Ambulatory Visit: Payer: Self-pay | Admitting: Internal Medicine

## 2013-02-13 DIAGNOSIS — N644 Mastodynia: Secondary | ICD-10-CM

## 2013-02-27 ENCOUNTER — Ambulatory Visit
Admission: RE | Admit: 2013-02-27 | Discharge: 2013-02-27 | Disposition: A | Discharge status: Home / Self Care | Payer: Medicaid Other | Source: Ambulatory Visit | Attending: Internal Medicine | Admitting: Internal Medicine

## 2013-02-27 DIAGNOSIS — N644 Mastodynia: Secondary | ICD-10-CM

## 2013-04-23 ENCOUNTER — Ambulatory Visit: Payer: Self-pay | Admitting: Advanced Practice Midwife

## 2013-06-27 ENCOUNTER — Emergency Department (HOSPITAL_BASED_OUTPATIENT_CLINIC_OR_DEPARTMENT_OTHER)
Admission: EM | Admit: 2013-06-27 | Discharge: 2013-06-27 | Disposition: A | Discharge status: Home / Self Care | Payer: Medicaid Other | Attending: Emergency Medicine | Admitting: Emergency Medicine

## 2013-06-27 ENCOUNTER — Emergency Department (HOSPITAL_BASED_OUTPATIENT_CLINIC_OR_DEPARTMENT_OTHER): Payer: Medicaid Other

## 2013-06-27 ENCOUNTER — Encounter (HOSPITAL_BASED_OUTPATIENT_CLINIC_OR_DEPARTMENT_OTHER): Payer: Self-pay | Admitting: Emergency Medicine

## 2013-06-27 DIAGNOSIS — Z8719 Personal history of other diseases of the digestive system: Secondary | ICD-10-CM | POA: Insufficient documentation

## 2013-06-27 DIAGNOSIS — S199XXA Unspecified injury of neck, initial encounter: Secondary | ICD-10-CM

## 2013-06-27 DIAGNOSIS — Z87891 Personal history of nicotine dependence: Secondary | ICD-10-CM | POA: Insufficient documentation

## 2013-06-27 DIAGNOSIS — Z792 Long term (current) use of antibiotics: Secondary | ICD-10-CM | POA: Insufficient documentation

## 2013-06-27 DIAGNOSIS — Z862 Personal history of diseases of the blood and blood-forming organs and certain disorders involving the immune mechanism: Secondary | ICD-10-CM | POA: Insufficient documentation

## 2013-06-27 DIAGNOSIS — S335XXA Sprain of ligaments of lumbar spine, initial encounter: Secondary | ICD-10-CM | POA: Insufficient documentation

## 2013-06-27 DIAGNOSIS — S239XXA Sprain of unspecified parts of thorax, initial encounter: Secondary | ICD-10-CM | POA: Insufficient documentation

## 2013-06-27 DIAGNOSIS — Y929 Unspecified place or not applicable: Secondary | ICD-10-CM | POA: Insufficient documentation

## 2013-06-27 DIAGNOSIS — Z8669 Personal history of other diseases of the nervous system and sense organs: Secondary | ICD-10-CM | POA: Insufficient documentation

## 2013-06-27 DIAGNOSIS — I10 Essential (primary) hypertension: Secondary | ICD-10-CM | POA: Insufficient documentation

## 2013-06-27 DIAGNOSIS — W08XXXA Fall from other furniture, initial encounter: Secondary | ICD-10-CM | POA: Insufficient documentation

## 2013-06-27 DIAGNOSIS — Z8742 Personal history of other diseases of the female genital tract: Secondary | ICD-10-CM | POA: Insufficient documentation

## 2013-06-27 DIAGNOSIS — IMO0002 Reserved for concepts with insufficient information to code with codable children: Secondary | ICD-10-CM

## 2013-06-27 DIAGNOSIS — E119 Type 2 diabetes mellitus without complications: Secondary | ICD-10-CM | POA: Insufficient documentation

## 2013-06-27 DIAGNOSIS — Y939 Activity, unspecified: Secondary | ICD-10-CM | POA: Insufficient documentation

## 2013-06-27 DIAGNOSIS — S0993XA Unspecified injury of face, initial encounter: Secondary | ICD-10-CM | POA: Insufficient documentation

## 2013-06-27 DIAGNOSIS — Z79899 Other long term (current) drug therapy: Secondary | ICD-10-CM | POA: Insufficient documentation

## 2013-06-27 DIAGNOSIS — Z8659 Personal history of other mental and behavioral disorders: Secondary | ICD-10-CM | POA: Insufficient documentation

## 2013-06-27 MED ORDER — HYDROCODONE-ACETAMINOPHEN 5-325 MG PO TABS
2.0000 | ORAL_TABLET | Freq: Once | ORAL | Status: AC
  Administered 2013-06-27: 2 via ORAL
  Filled 2013-06-27: qty 2

## 2013-06-27 MED ORDER — HYDROCODONE-ACETAMINOPHEN 5-325 MG PO TABS: 1.0000 | ORAL_TABLET | Freq: Four times a day (QID) | ORAL | Status: AC | PRN

## 2013-06-27 NOTE — ED Notes (Signed)
MD at bedside. 

## 2013-06-27 NOTE — ED Notes (Signed)
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

## 2013-06-27 NOTE — ED Notes (Signed)
Patient transported to X-ray 

## 2013-06-27 NOTE — Discharge Instructions (Signed)
Back Exercises °Back exercises help treat and prevent back injuries. The goal of back exercises is to increase the strength of your abdominal and back muscles and the flexibility of your back. These exercises should be started when you no longer have back pain. Back exercises include: °· Pelvic Tilt. Lie on your back with your knees bent. Tilt your pelvis until the lower part of your back is against the floor. Hold this position 5 to 10 sec and repeat 5 to 10 times. °· Knee to Chest. Pull first 1 knee up against your chest and hold for 20 to 30 seconds, repeat this with the other knee, and then both knees. This may be done with the other leg straight or bent, whichever feels better. °· Sit-Ups or Curl-Ups. Bend your knees 90 degrees. Start with tilting your pelvis, and do a partial, slow sit-up, lifting your trunk only 30 to 45 degrees off the floor. Take at least 2 to 3 seconds for each sit-up. Do not do sit-ups with your knees out straight. If partial sit-ups are difficult, simply do the above but with only tightening your abdominal muscles and holding it as directed. °· Hip-Lift. Lie on your back with your knees flexed 90 degrees. Push down with your feet and shoulders as you raise your hips a couple inches off the floor; hold for 10 seconds, repeat 5 to 10 times. °· Back arches. Lie on your stomach, propping yourself up on bent elbows. Slowly press on your hands, causing an arch in your low back. Repeat 3 to 5 times. Any initial stiffness and discomfort should lessen with repetition over time. °· Shoulder-Lifts. Lie face down with arms beside your body. Keep hips and torso pressed to floor as you slowly lift your head and shoulders off the floor. °Do not overdo your exercises, especially in the beginning. Exercises may cause you some mild back discomfort which lasts for a few minutes; however, if the pain is more severe, or lasts for more than 15 minutes, do not continue exercises until you see your caregiver.  Improvement with exercise therapy for back problems is slow.  °See your caregivers for assistance with developing a proper back exercise program. °Document Released: 05/12/2004 Document Revised: 06/27/2011 Document Reviewed: 02/03/2011 °[employer]® Patient Information ©2014 [employer]. ° °Back Pain, Adult °Back pain is very common. The pain often gets better over time. The cause of back pain is usually not dangerous. Most people can learn to manage their back pain on their own.  °HOME CARE  °· Stay active. Start with short walks on flat ground if you can. Try to walk farther each day. °· Do not sit, drive, or stand in one place for more than 30 minutes. Do not stay in bed. °· Do not avoid exercise or work. Activity can help your back heal faster. °· Be careful when you bend or lift an object. Bend at your knees, keep the object close to you, and do not twist. °· Sleep on a firm mattress. Lie on your side, and bend your knees. If you lie on your back, put a pillow under your knees. °· Only take medicines as told by your doctor. °· Put ice on the injured area. °· Put ice in a plastic bag. °· Place a towel between your skin and the bag. °· Leave the ice on for 15-20 minutes, 03-04 times a day for the first 2 to 3 days. After that, you can switch between ice and heat packs. °· Ask your   doctor about back exercises or massage.  Avoid feeling anxious or stressed. Find good ways to deal with stress, such as exercise. GET HELP RIGHT AWAY IF:   Your pain does not go away with rest or medicine.  Your pain does not go away in 1 week.  You have new problems.  You do not feel well.  The pain spreads into your legs.  You cannot control when you poop (bowel movement) or pee (urinate).  Your arms or legs feel weak or lose feeling (numbness).  You feel sick to your stomach (nauseous) or throw up (vomit).  You have belly (abdominal) pain.  You feel like you may pass out (faint). MAKE SURE YOU:   Understand  these instructions.  Will watch your condition.  Will get help right away if you are not doing well or get worse. Document Released: 09/21/2007 Document Revised: 06/27/2011 Document Reviewed: 08/23/2010 [facility] Patient Information 2014 [employer].

## 2013-06-27 NOTE — ED Provider Notes (Signed)
CSN: [id number]     Arrival date & time 06/27/13  0800 History   First MD Initiated Contact with Patient 06/27/13 [phone]     Chief Complaint  Patient presents with  . Neck Pain  . Fall  . Back Pain     (Consider location/radiation/quality/duration/timing/severity/associated sxs/prior Treatment) HPI Comments: Pt fell backwards off a rolling exam stool yesterday and reports she landed on her back.  She has had continued back pain since fall.   Patient is a 33 y.o. female presenting with fall and back pain. The history is provided by the patient. No language interpreter was used.  Fall This is a new problem. The current episode started 12 to 24 hours ago. The problem occurs rarely. The problem has not changed since onset.Pertinent negatives include no chest pain, no abdominal pain, no headaches and no shortness of breath. The symptoms are aggravated by walking, bending and standing. The symptoms are relieved by rest. She has tried rest (naproxen) for the symptoms. The treatment provided no relief.  Back Pain Location:  Thoracic spine and lumbar spine Quality:  Aching Radiates to:  Does not radiate Pain severity:  Severe Pain is:  Same all the time Onset quality:  Sudden Duration:  1 day Timing:  Constant Progression:  Unchanged Chronicity:  Recurrent (hx of soft tissue back injury about 20 years ago with worsening pain after fall yesterday) Context: falling   Relieved by:  Lying down and being still Worsened by:  Standing, twisting and bending Ineffective treatments: naproxen. Associated symptoms: no abdominal pain, no chest pain, no dysuria, no fever, no headaches, no leg pain, no numbness, no paresthesias, no pelvic pain, no tingling and no weakness   Risk factors: obesity     Past Medical History  Diagnosis Date  . Depression   . Anxiety   . Anemia     no current med.  . Diverticular disease of colon   . 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 Vaginal cyst 08/2011   current antibiotic, will finish 08/31/2011  . Hypertension     under control, has been on med. > 4 yrs.  . Family history of anesthesia complication     great aunt had hallucinations after IV med administered during anesthesia  . Endometriosis   . Diabetes mellitus without complication    Past Surgical History  Procedure Laterality Date  . Cesarean section  2004, 01/30/2006  . Appendectomy    . Tubal ligation  07/28/2008    laparoscopic  . Carpal tunnel release  08/23/2011    Procedure: CARPAL TUNNEL RELEASE;  Surgeon: Tennis Must, MD;  Location: [facility];  Service: Orthopedics;  Laterality: Right;  . Colon surgery  10/26/11    lap partial colectomy   Family History  Problem Relation Age of Onset  . Cancer Maternal Grandfather     lung  . Hypertension Mother   . Hypertension Father   . Seizures Father   . Hypertension Brother    History  Substance Use Topics  . Smoking status: Former Research scientist (life sciences)  . Smokeless tobacco: Never Used     Comment: can't remember when she quit smoking  . Alcohol Use: No   OB History   Grav Para Term Preterm Abortions TAB SAB Ect Mult Living   6 2 2  3  3   2      Review of Systems  Constitutional: Negative for fever, chills, diaphoresis, activity change, appetite change and fatigue.  HENT: Negative  for congestion, facial swelling, rhinorrhea and sore throat.   Eyes: Negative for photophobia and discharge.  Respiratory: Negative for cough, chest tightness and shortness of breath.   Cardiovascular: Negative for chest pain, palpitations and leg swelling.  Gastrointestinal: Negative for nausea, vomiting, abdominal pain and diarrhea.  Endocrine: Negative for polydipsia and polyuria.  Genitourinary: Negative for dysuria, frequency, difficulty urinating and pelvic pain.  Musculoskeletal: Positive for back pain and neck pain. Negative for arthralgias and neck stiffness.  Skin: Negative for color change and wound.  Allergic/Immunologic:  Negative for immunocompromised state.  Neurological: Negative for tingling, facial asymmetry, weakness, numbness, headaches and paresthesias.  Hematological: Does not bruise/bleed easily.  Psychiatric/Behavioral: Negative for confusion and agitation.      Allergies  Bee venom; Citrus; Pepperoni; Pineapple; Banana; Betadine; Codeine; and Sulfa antibiotics  Home Medications   Current Outpatient Rx  Name  Route  Sig  Dispense  Refill  . glimepiride (AMARYL) 1 MG tablet   Oral   Take 1 mg by mouth daily with breakfast.         . butalbital-acetaminophen-caffeine (FIORICET) 50-325-40 MG per tablet   Oral   Take 1-2 tablets by mouth every 8 (eight) hours as needed for headache.   20 tablet   0   . cephALEXin (KEFLEX) 500 MG capsule   Oral   Take 1 capsule (500 mg total) by mouth 4 (four) times daily.   10 capsule   0   . cephALEXin (KEFLEX) 500 MG capsule   Oral   Take 1 capsule (500 mg total) by mouth 3 (three) times daily.   30 capsule   0   . cetirizine (ZYRTEC) 10 MG tablet   Oral   Take 10 mg by mouth daily as needed for allergies.         Marland Kitchen dextrose 5 % SOLN 100 mL with doxycycline 100 MG SOLR   Intravenous   Inject into the vein.         Marland Kitchen EPINEPHrine (EPIPEN) 0.3 mg/0.3 mL DEVI   Intramuscular   Inject 0.3 mg into the muscle once as needed (for allergic reaction).         Marland Kitchen HYDROcodone-acetaminophen (NORCO) 5-325 MG per tablet   Oral   Take 1 tablet by mouth every 6 (six) hours as needed.   10 tablet   0   . lisinopril-hydrochlorothiazide (PRINZIDE,ZESTORETIC) 10-12.5 MG per tablet   Oral   Take 1 tablet by mouth every morning.          . metFORMIN (GLUCOPHAGE) 500 MG tablet   Oral   Take 500 mg by mouth 2 (two) times daily with a meal.         . naproxen (NAPROSYN) 500 MG tablet   Oral   Take 500 mg by mouth 2 (two) times daily as needed (for pain).         Marland Kitchen omeprazole (PRILOSEC) 20 MG capsule   Oral   Take 20 mg by mouth daily.           BP 151/81  Pulse 99  Temp(Src) 99.1 F (37.3 C) (Oral)  Resp 18  Ht 5\' 3"  (1.6 m)  Wt 266 lb (120.657 kg)  BMI 47.13 kg/m2  SpO2 98%  LMP 05/30/2013 Physical Exam  Constitutional: She is oriented to person, place, and time. She appears well-developed and well-nourished. No distress.  HENT:  Head: Normocephalic and atraumatic.  Mouth/Throat: No oropharyngeal exudate.  Eyes: Pupils are equal, round, and reactive to  light.  Neck: Normal range of motion. Neck supple.  Cardiovascular: Normal rate, regular rhythm and normal heart sounds.  Exam reveals no gallop and no friction rub.   No murmur heard. Pulmonary/Chest: Effort normal and breath sounds normal. No respiratory distress. She has no wheezes. She has no rales.  Abdominal: Soft. Bowel sounds are normal. She exhibits no distension and no mass. There is no tenderness. There is no rebound and no guarding.  Musculoskeletal: Normal range of motion. She exhibits no edema.       Lumbar back: She exhibits tenderness and bony tenderness.       Back:  Neurological: She is alert and oriented to person, place, and time.  Skin: Skin is warm and dry.  Psychiatric: She has a normal mood and affect.    ED Course  Procedures (including critical care time) Labs Review Labs Reviewed - No data to display Imaging Review Dg Thoracic Spine 2 View  06/27/2013   CLINICAL DATA:  Pain post trauma  EXAM: THORACIC SPINE - 2 VIEW  COMPARISON:  None.  FINDINGS: Frontal and lateral views were obtained. There is mild upper thoracic levoscoliosis. There is no fracture or spondylolisthesis. Disc spaces appear intact.  IMPRESSION: Slight scoliosis.  No fracture or spondylolisthesis.   Electronically Signed   By: Lowella Grip M.D.   On: 06/27/2013 09:02   Dg Lumbar Spine 2-3 Views  06/27/2013   CLINICAL DATA:  Pain post trauma  EXAM: LUMBAR SPINE - 2-3 VIEW  COMPARISON:  None.  FINDINGS: Frontal, lateral, and spot lumbosacral lateral images were  obtained. There are 5 non-rib-bearing lumbar type vertebral bodies. There is no fracture or spondylolisthesis. Disc spaces appear intact. No erosive change. There is a clip in the left pelvis region.  IMPRESSION: No fracture or appreciable arthropathy.   Electronically Signed   By: Lowella Grip M.D.   On: 06/27/2013 09:02     EKG Interpretation None      MDM   Final diagnoses:  Back sprain or strain    Pt is a 33 y.o. female with Pmhx as above who presents with complaint of lower thoracic and mid lumbar/R lumbar paraspinal back pain after falling off a rolling stool backwards yesterday landing on back. No head injury, no LOC. She denies midline c-spine pain, but states she has some muscular soreness w/ ROM of neck. Denies fever, chills, numbness, weakness. She had episode of emesis after taking home meds last night including naproxen. She has not tried anything else for symptoms. No PE, VSS, pt in NAD. +ttp over lower thoracic, mid lumbar and R paraspinal back.  No focal neuro findings. XR T&L spine ordere to r/o acute compression fx which was negative for acute bony findings.  Suspect sprain/strain and will rec conservative tx with short course of norco along with home naproxen, gentle stretching, and outpt f/u with PCP.         Neta Ehlers, MD 06/27/13 [phone]

## 2014-01-17 IMAGING — RF DG BE W/ AIR HIGH DENSITY
15 of 23 series · 15 of 23 positions shown · non-contrast
Comparison: MR abdomen of 09/07/2011 and CT abdomen pelvis of
09/30/2010

CLINICAL DATA: Possible diverticular disease

AIR-CONTRAST BARIUM ENEMA
TECHNIQUE: After obtaining a scout radiograph air-contrast barium
enema was performed under fluoroscopy using high-density barium.
Fluoroscopy Time: 2.8 minutes

[Series 1: run · 1 of 1 slices shown (1 of 13)]
[im 1/1]
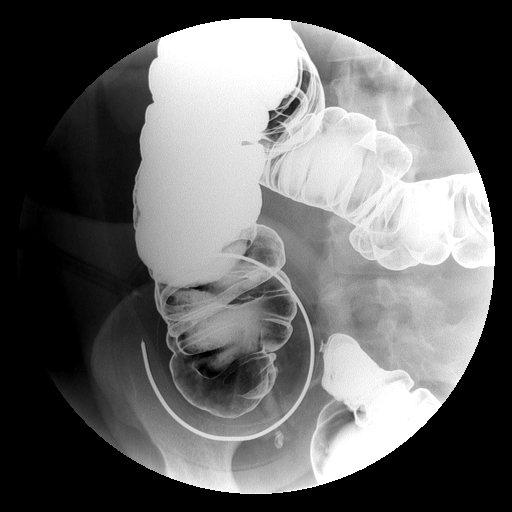

[Series 2: run · 1 of 1 slices shown (2 of 13)]
[im 1/1]
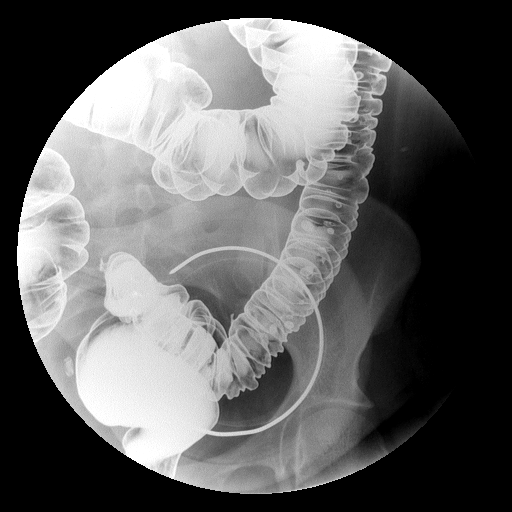

[Series 3: run · 1 of 1 slices shown (3 of 13)]
[im 1/1]
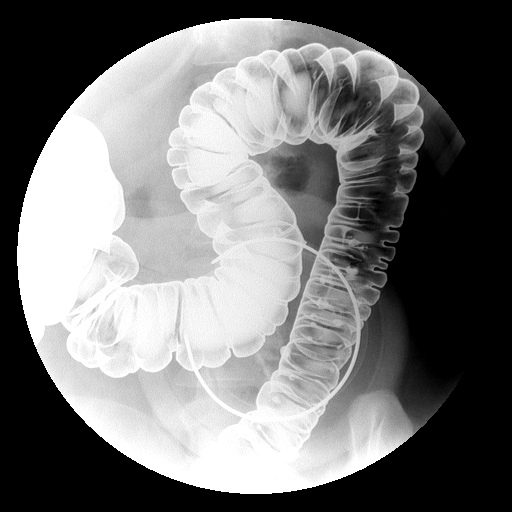

[Series 4: run · 1 of 1 slices shown (4 of 13)]
[im 1/1]
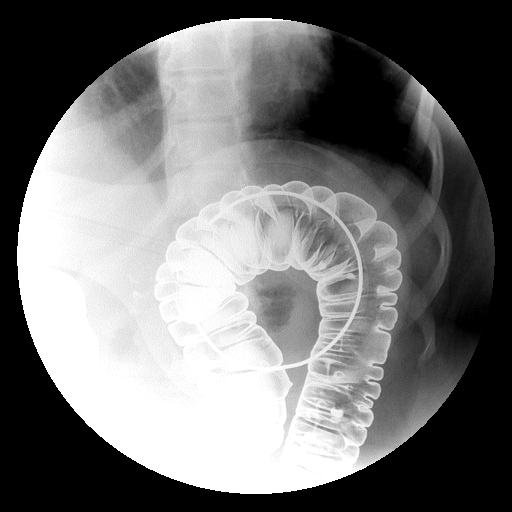

[Series 5: run · 1 of 1 slices shown (5 of 13)]
[im 1/1]
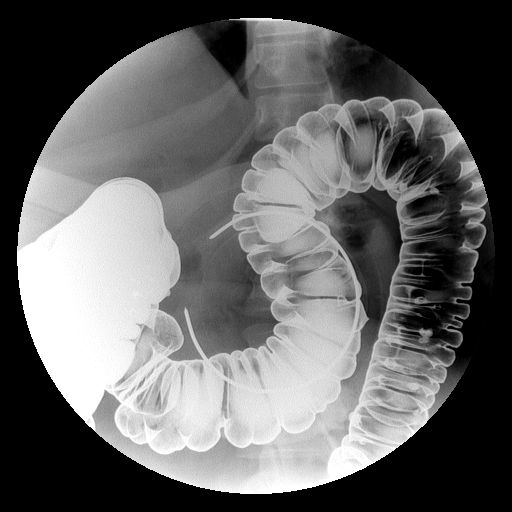

[Series 6: run · 1 of 1 slices shown (6 of 13)]
[im 1/1]
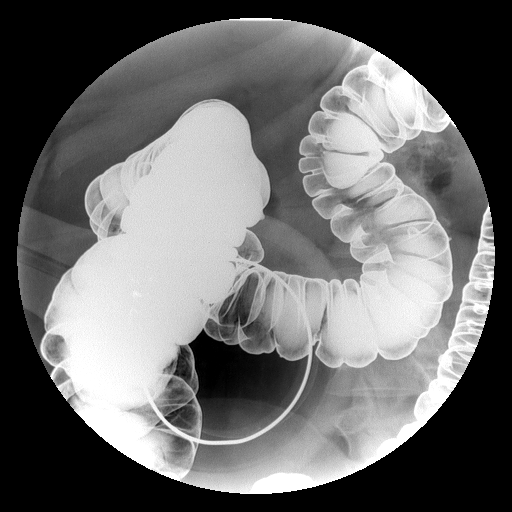

[Series 7: run · 1 of 1 slices shown (7 of 13)]
[im 1/1]
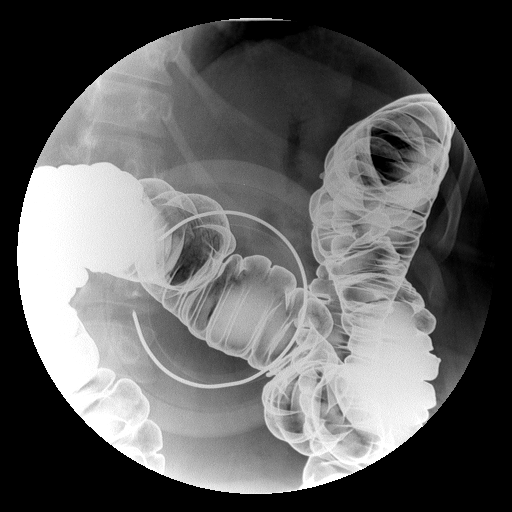

[Series 8: run · 1 of 1 slices shown (8 of 13)]
[im 1/1]
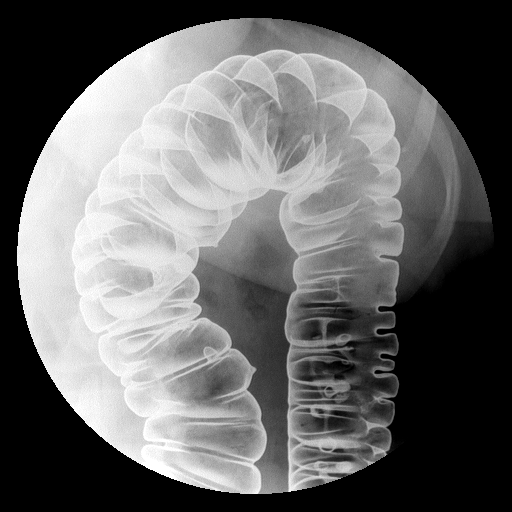

[Series 9: run · 1 of 1 slices shown (9 of 13)]
[im 1/1]
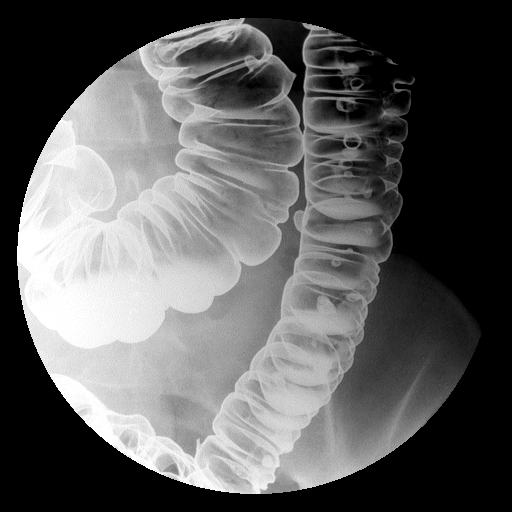

[Series 10: run · 1 of 1 slices shown (10 of 13)]
[im 1/1]
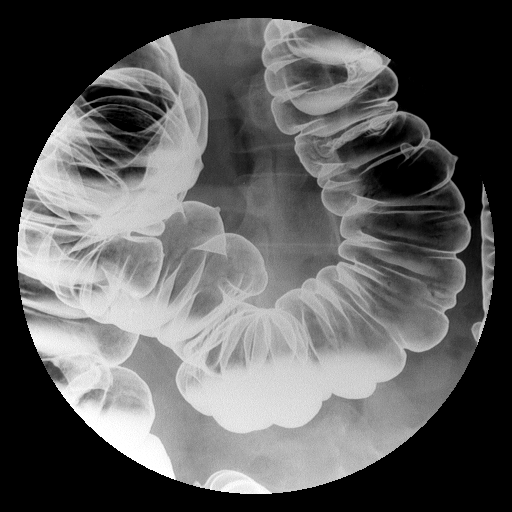

[Series 11: run · 1 of 1 slices shown (11 of 13)]
[im 1/1]
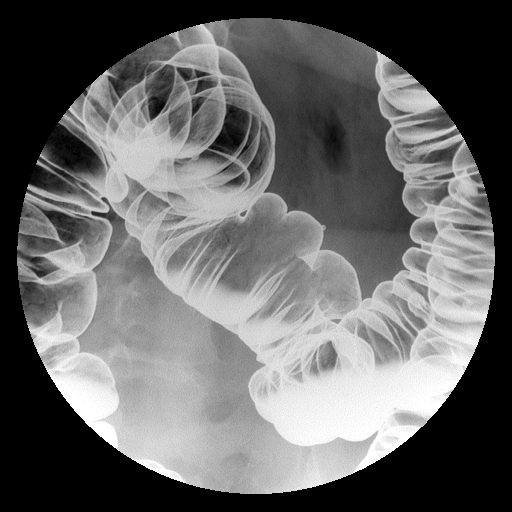

[Series 12: run · 1 of 1 slices shown (12 of 13)]
[im 1/1]
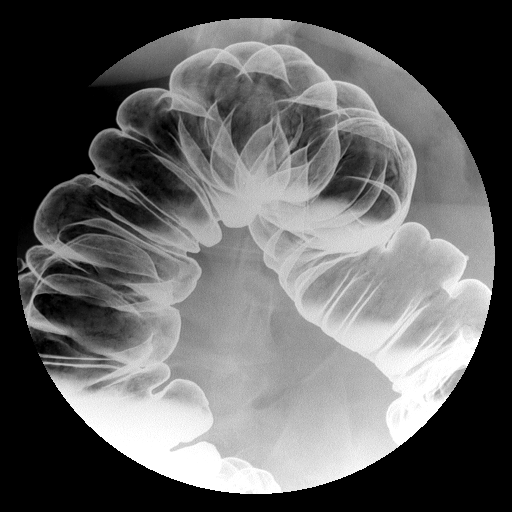

[Series 13: run · 1 of 1 slices shown (13 of 13)]
[im 1/1]
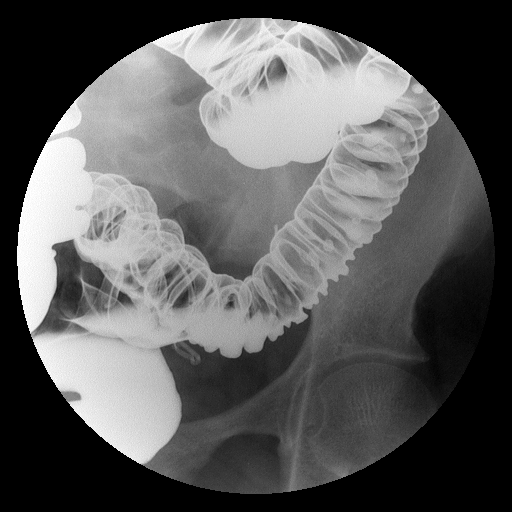

[Series 1001: view not recorded · 0.20mm/px · 1 of 1 slices shown (1 of 2)]
[im 1/1]
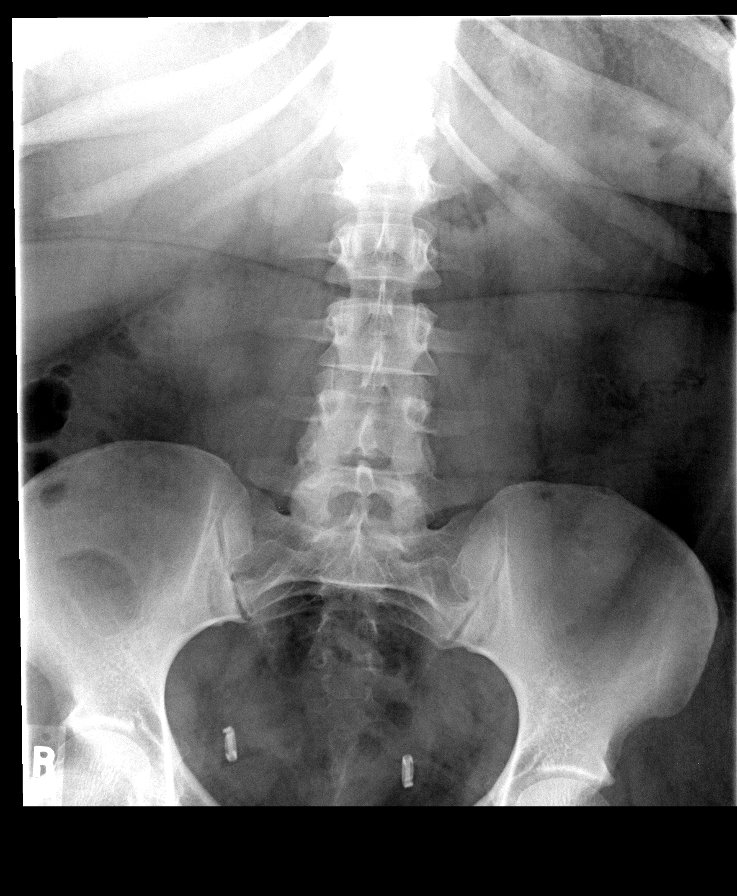

[Series 1002: view not recorded · 0.20mm/px · 1 of 1 slices shown (2 of 2)]
[im 1/1]
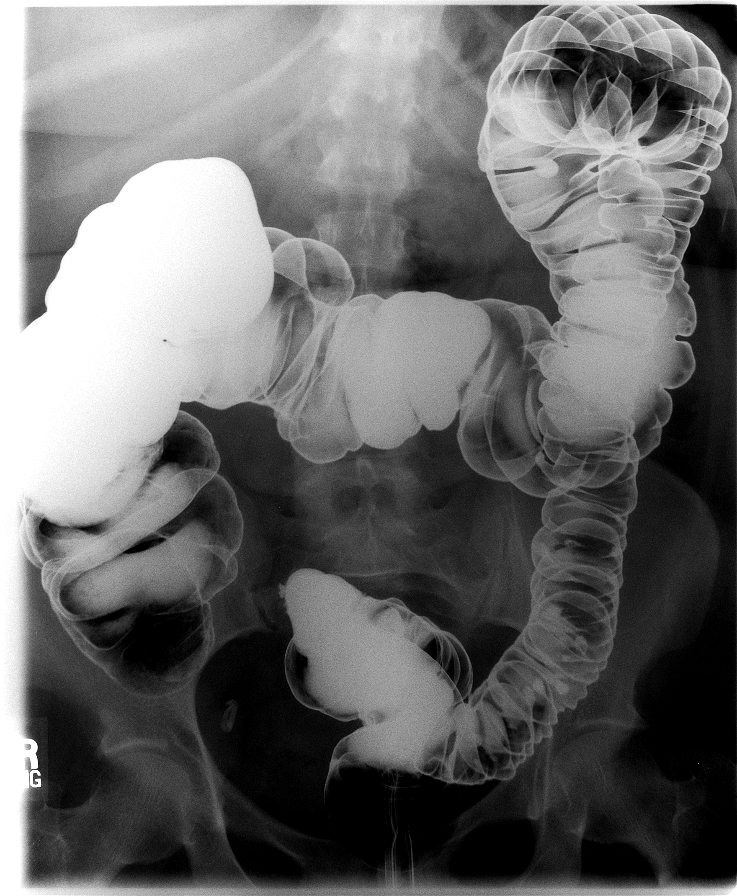

[15 of 23 positions shown; findings below may reference images not displayed]

FINDINGS: A preliminary film of the abdomen shows no bowel
obstruction.  Bilateral tubal ligation clips are present.  No
opaque calculi are seen.

A double contrast barium enema was performed.  There are
diverticula scattered throughout the rectosigmoid and descending
colon. A few small diverticula are present within the transverse
colon.  No diverticula are seen within the more proximal transverse
colon and right colon.  The cecum is well visualized and appears
normal.  No persistent polypoid lesion or constricting lesion is
seen.
IMPRESSION: Diverticula primarily in the descending and rectosigmoid colon.  No
polypoid lesion or constricting lesion is seen.

## 2014-02-17 ENCOUNTER — Encounter (HOSPITAL_BASED_OUTPATIENT_CLINIC_OR_DEPARTMENT_OTHER): Payer: Self-pay | Admitting: Emergency Medicine

## 2015-08-13 ENCOUNTER — Inpatient Hospital Stay
Admit: 2015-08-13 | Discharge: 2015-08-14 | Disposition: A | Discharge status: Home / Self Care | Payer: MEDICAID | Attending: Emergency Medicine

## 2015-08-13 DIAGNOSIS — R197 Diarrhea, unspecified: Secondary | ICD-10-CM

## 2015-08-13 NOTE — ED Notes (Signed)
Po challenge with diet gingerale and water started

## 2015-08-13 NOTE — ED Provider Notes (Signed)
HPI Comments: 35 y.o. female with no significant past medical history who presents with chief complaint of diarrhea. Pt complains of nausea, vomiting (x1), diarrhea (x3-4), and diffuse abd cramping that began today at 1330 (~6.5 hours ago). Additional sx includes decreased appetite. No treatment PTA. No fever, chills, blood in stool, CP or SOB. There are no other acute medical concerns at this time.    Social hx: denies tobacco use; + ETOH (socially); denies illicit drug use    PCP: Lowella Bandy, MD    Note written by Cordelia Pen. Stone, Education administrator, as dictated by Maureen Chatters, PA-C 8:02 PM      The history is provided by the patient. No language interpreter was used.        Past Medical History:   Diagnosis Date   ??? Diabetes ([place])    ??? Ill-defined condition     endometriosis       Past Surgical History:   Procedure Laterality Date   ??? HX APPENDECTOMY     ??? HX GI      partial colectomy/ diverticulitis         History reviewed. No pertinent family history.    Social History     Social History   ??? Marital status: WIDOWED     Spouse name: N/A   ??? Number of children: N/A   ??? Years of education: N/A     Occupational History   ??? Not on file.     Social History Main Topics   ??? Smoking status: Never Smoker   ??? Smokeless tobacco: Not on file   ??? Alcohol use No   ??? Drug use: Not on file   ??? Sexual activity: Not on file     Other Topics Concern   ??? Not on file     Social History Narrative   ??? No narrative on file         ALLERGIES: Betadine [povidone-iodine]; Codeine; and Sulfa (sulfonamide antibiotics)    Review of Systems   Constitutional: Positive for appetite change (decreased). Negative for chills and fever.   HENT: Negative for congestion, facial swelling, nosebleeds, rhinorrhea, sneezing and sore throat.    Eyes: Negative for pain, redness and visual disturbance.   Respiratory: Negative for cough, chest tightness and shortness of breath.    Cardiovascular: Negative for chest pain and leg swelling.    Gastrointestinal: Positive for abdominal pain, diarrhea, nausea and vomiting. Negative for blood in stool.   Endocrine: Negative for polyuria.   Genitourinary: Negative for difficulty urinating, flank pain and frequency.   Musculoskeletal: Negative for arthralgias, back pain, myalgias and neck stiffness.   Skin: Negative for color change and rash.   Allergic/Immunologic: Negative for immunocompromised state.   Neurological: Negative for dizziness, syncope, weakness and headaches.   Hematological: Negative for adenopathy. Does not bruise/bleed easily.   Psychiatric/Behavioral: Negative for agitation.       Patient Vitals for the past 12 hrs:   Temp Pulse Resp BP SpO2   08/13/15 2147 - 98 - - -   08/13/15 2145 - - - 126/70 -   08/13/15 2142 - - - - 98 %   08/13/15 2130 - - - 119/68 -   08/13/15 2128 - - - - 96 %   08/13/15 2125 - - - - 96 %   08/13/15 2119 - - - - 100 %   08/13/15 2118 - - - 134/75 (!) 81 %   08/13/15 1929 98.4 ??F (36.9 ??C) Marland Kitchen)  102 18 170/85 99 %              Physical Exam   Constitutional: She is oriented to person, place, and time. She appears well-developed and well-nourished. No distress.   HENT:   Head: Normocephalic and atraumatic.   Right Ear: External ear normal.   Left Ear: External ear normal.   Neck: Neck supple.   Cardiovascular: Normal rate, regular rhythm, normal heart sounds and intact distal pulses.  Exam reveals no gallop and no friction rub.    No murmur heard.  Pulmonary/Chest: Effort normal and breath sounds normal. No stridor. No respiratory distress. She has no wheezes. She has no rales. She exhibits no tenderness.   Abdominal: Soft. Bowel sounds are normal. She exhibits no distension and no mass. There is tenderness. There is no rebound and no guarding.   Diffuse abd TTP without rebounding or guarding. No CVAT   Musculoskeletal: Normal range of motion. She exhibits no edema, tenderness or deformity.   Neurological: She is alert and oriented to person, place, and time. No  cranial nerve deficit. Coordination normal.   Skin: No rash noted. No erythema. No pallor.   Psychiatric: She has a normal mood and affect. Her behavior is normal.   Nursing note and vitals reviewed.       MDM  Number of Diagnoses or Management Options     Amount and/or Complexity of Data Reviewed  Clinical lab tests: ordered and reviewed  Tests in the medicine section of CPT??: reviewed and ordered  Review and summarize past medical records: yes  Independent visualization of images, tracings, or specimens: yes    Patient Progress  Patient progress: stable    ED Course       Procedures    8:17 PM  Discussed pt, sx, hx and current findings with Dr Kathrynn Running. He is in agreement with plan and will see pt  Mare Loan. Keaun Schnabel, PA-C    9:58 PM   feeling better after meds. Tolerating po fluids. abd non tender. Will discharge home. Suspect viral syndrome  Mare Loan. Lincoln Kleiner, PA-C    LABORATORY TESTS:  Recent Results (from the past 12 hour(s))   HCG URINE, QL. - POC    Collection Time: 08/13/15  7:48 PM   Result Value Ref Range    Pregnancy test,urine (POC) NEGATIVE  NEG     URINALYSIS W/ REFLEX CULTURE    Collection Time: 08/13/15  7:56 PM   Result Value Ref Range    Color YELLOW/STRAW      Appearance CLEAR CLEAR      Specific gravity 1.030 1.003 - 1.030      pH (UA) 5.0 5.0 - 8.0      Protein 30 (A) NEG mg/dL    Glucose 100 (A) NEG mg/dL    Ketone NEGATIVE  NEG mg/dL    Bilirubin NEGATIVE  NEG      Blood NEGATIVE  NEG      Urobilinogen 0.2 0.2 - 1.0 EU/dL    Nitrites NEGATIVE  NEG      Leukocyte Esterase NEGATIVE  NEG      WBC 0-4 0 - 4 /hpf    RBC 0-5 0 - 5 /hpf    Epithelial cells FEW FEW /lpf    Bacteria NEGATIVE  NEG /hpf    UA:UC IF INDICATED CULTURE NOT INDICATED BY UA RESULT CNI      CA Oxalate crystals 2+ (A) NEG   CBC WITH AUTOMATED DIFF  Collection Time: 08/13/15  8:49 PM   Result Value Ref Range    WBC 11.2 (H) 3.6 - 11.0 K/uL    RBC 4.17 3.80 - 5.20 M/uL    HGB 9.9 (L) 11.5 - 16.0 g/dL     HCT 32.0 (L) 35.0 - 47.0 %    MCV 76.7 (L) 80.0 - 99.0 FL    MCH 23.7 (L) 26.0 - 34.0 PG    MCHC 30.9 30.0 - 36.5 g/dL    RDW 18.8 (H) 11.5 - 14.5 %    PLATELET 541 (H) 150 - 400 K/uL    NEUTROPHILS 60 32 - 75 %    LYMPHOCYTES 31 12 - 49 %    MONOCYTES 6 5 - 13 %    EOSINOPHILS 2 0 - 7 %    BASOPHILS 1 0 - 1 %    ABS. NEUTROPHILS 6.7 1.8 - 8.0 K/UL    ABS. LYMPHOCYTES 3.5 0.8 - 3.5 K/UL    ABS. MONOCYTES 0.7 0.0 - 1.0 K/UL    ABS. EOSINOPHILS 0.2 0.0 - 0.4 K/UL    ABS. BASOPHILS 0.1 0.0 - 0.1 K/UL   METABOLIC PANEL, COMPREHENSIVE    Collection Time: 08/13/15  8:49 PM   Result Value Ref Range    Sodium 136 136 - 145 mmol/L    Potassium 3.6 3.5 - 5.1 mmol/L    Chloride 100 97 - 108 mmol/L    CO2 23 21 - 32 mmol/L    Anion gap 13 5 - 15 mmol/L    Glucose 209 (H) 65 - 100 mg/dL    BUN 8 6 - 20 MG/DL    Creatinine 0.81 0.55 - 1.02 MG/DL    BUN/Creatinine ratio 10 (L) 12 - 20      GFR est AA >60 >60 ml/min/1.76m    GFR est non-AA >60 >60 ml/min/1.760m   Calcium 8.4 (L) 8.5 - 10.1 MG/DL    Bilirubin, total 0.2 0.2 - 1.0 MG/DL    ALT (SGPT) 16 12 - 78 U/L    AST (SGOT) 12 (L) 15 - 37 U/L    Alk. phosphatase 55 45 - 117 U/L    Protein, total 7.8 6.4 - 8.2 g/dL    Albumin 3.4 (L) 3.5 - 5.0 g/dL    Globulin 4.4 (H) 2.0 - 4.0 g/dL    A-G Ratio 0.8 (L) 1.1 - 2.2     MAGNESIUM    Collection Time: 08/13/15  8:49 PM   Result Value Ref Range    Magnesium 1.6 1.6 - 2.4 mg/dL   LIPASE    Collection Time: 08/13/15  8:49 PM   Result Value Ref Range    Lipase 206 73 - 393 U/L       IMAGING RESULTS:  No orders to display       MEDICATIONS GIVEN:  Medications   sodium chloride 0.9 % bolus infusion 1,000 mL (0 mL IntraVENous IV Completed 08/13/15 2147)   dicyclomine (BENTYL) 10 mg/mL injection 20 mg (20 mg IntraMUSCular Given 08/13/15 2037)   famotidine (PF) (PEPCID) 20 mg in sodium chloride 0.9 % 10 mL injection (20 mg IntraVENous Given 08/13/15 2038)   prochlorperazine (COMPAZINE) injection 10 mg (10 mg IntraVENous Given 08/13/15 2037)        IMPRESSION:  1. Nausea vomiting and diarrhea    2. Hyperglycemia        PLAN:  1.   Current Discharge Medication List      START taking these medications  Details   dicyclomine (BENTYL) 10 mg capsule Take 2 Caps by mouth four (4) times daily.  Qty: 20 Cap, Refills: 0      ondansetron (ZOFRAN ODT) 4 mg disintegrating tablet Take 1 Tab by mouth every eight (8) hours as needed for Nausea.  Qty: 10 Tab, Refills: 0         CONTINUE these medications which have NOT CHANGED    Details   metFORMIN (GLUCOPHAGE) 1,000 mg tablet Take 1,000 mg by mouth two (2) times daily (with meals).      SITagliptin (JANUVIA) 50 mg tablet Take 50 mg by mouth daily.      glipiZIDE (GLUCOTROL) 10 mg tablet Take 10 mg by mouth daily.      ferrous sulfate 220 mg (44 mg iron)/5 mL solution Take  by mouth daily.           2.   Follow-up Information     Follow up With Details Comments Contact Info    Lowella Bandy, MD Schedule an appointment as soon as possible for a visit 2-4 days for recheck [address]  [place] [address]  [phone]          Return to ED if worse     9:59 PM  Pt has been reexamined.  Pt has no new complaints, changes or physical findings. Care plan outlined and precautions discussed. All available results were reviewed with pt. All medications were reviewed with pt. All of pt's questions and concerns were addressed. Pt agrees to F/U as instructed and agrees to return to ED upon further deterioration. Pt is ready to go home.  Dorian Furnace, PA-C

## 2015-08-13 NOTE — ED Triage Notes (Signed)
Patient arrives with c/o nausea and diarrhea, onset 2pm today.

## 2015-08-14 LAB — METABOLIC PANEL, COMPREHENSIVE
A-G Ratio: 0.8 — ABNORMAL LOW (ref 1.1–2.2)
ALT (SGPT): 16 U/L (ref 12–78)
AST (SGOT): 12 U/L — ABNORMAL LOW (ref 15–37)
Albumin: 3.4 g/dL — ABNORMAL LOW (ref 3.5–5.0)
Alk. phosphatase: 55 U/L (ref 45–117)
Anion gap: 13 mmol/L (ref 5–15)
BUN/Creatinine ratio: 10 — ABNORMAL LOW (ref 12–20)
BUN: 8 MG/DL (ref 6–20)
Bilirubin, total: 0.2 MG/DL (ref 0.2–1.0)
CO2: 23 mmol/L (ref 21–32)
Calcium: 8.4 MG/DL — ABNORMAL LOW (ref 8.5–10.1)
Chloride: 100 mmol/L (ref 97–108)
Creatinine: 0.81 MG/DL (ref 0.55–1.02)
GFR est AA: >60 mL/min/{1.73_m2} (ref >60)
GFR est non-AA: >60 mL/min/{1.73_m2} (ref >60)
Globulin: 4.4 g/dL — ABNORMAL HIGH (ref 2.0–4.0)
Glucose: 209 mg/dL — ABNORMAL HIGH (ref 65–100)
Potassium: 3.6 mmol/L (ref 3.5–5.1)
Protein, total: 7.8 g/dL (ref 6.4–8.2)
Sodium: 136 mmol/L (ref 136–145)

## 2015-08-14 LAB — URINALYSIS W/ REFLEX CULTURE
Bacteria: NEGATIVE /hpf
Bilirubin: NEGATIVE
Blood: NEGATIVE
Glucose: 100 mg/dL — AB
Ketone: NEGATIVE mg/dL
Leukocyte Esterase: NEGATIVE
Nitrites: NEGATIVE
Protein: 30 mg/dL — AB
Specific gravity: 1.03 (ref 1.003–1.030)
Urobilinogen: 0.2 EU/dL (ref 0.2–1.0)
pH (UA): 5 (ref 5.0–8.0)

## 2015-08-14 LAB — CBC WITH AUTOMATED DIFF
ABS. BASOPHILS: 0.1 10*3/uL (ref 0.0–0.1)
ABS. EOSINOPHILS: 0.2 10*3/uL (ref 0.0–0.4)
ABS. LYMPHOCYTES: 3.5 10*3/uL (ref 0.8–3.5)
ABS. MONOCYTES: 0.7 10*3/uL (ref 0.0–1.0)
ABS. NEUTROPHILS: 6.7 10*3/uL (ref 1.8–8.0)
BASOPHILS: 1 % (ref 0–1)
EOSINOPHILS: 2 % (ref 0–7)
HCT: 32 % — ABNORMAL LOW (ref 35.0–47.0)
HGB: 9.9 g/dL — ABNORMAL LOW (ref 11.5–16.0)
LYMPHOCYTES: 31 % (ref 12–49)
MCH: 23.7 PG — ABNORMAL LOW (ref 26.0–34.0)
MCHC: 30.9 g/dL (ref 30.0–36.5)
MCV: 76.7 FL — ABNORMAL LOW (ref 80.0–99.0)
MONOCYTES: 6 % (ref 5–13)
NEUTROPHILS: 60 % (ref 32–75)
PLATELET: 541 10*3/uL — ABNORMAL HIGH (ref 150–400)
RBC: 4.17 M/uL (ref 3.80–5.20)
RDW: 18.8 % — ABNORMAL HIGH (ref 11.5–14.5)
WBC: 11.2 10*3/uL — ABNORMAL HIGH (ref 3.6–11.0)

## 2015-08-14 LAB — HCG URINE, QL. - POC: Pregnancy test,urine (POC): NEGATIVE

## 2015-08-14 LAB — MAGNESIUM: Magnesium: 1.6 mg/dL (ref 1.6–2.4)

## 2015-08-14 LAB — LIPASE: Lipase: 206 U/L (ref 73–393)

## 2015-08-14 MED ORDER — DICYCLOMINE 10 MG/ML IM SOLN
10 mg/mL | INTRAMUSCULAR | Status: AC
Start: 2015-08-14 — End: 2015-08-13
  Administered 2015-08-14: 01:00:00 via INTRAMUSCULAR

## 2015-08-14 MED ORDER — DICYCLOMINE 10 MG CAP
10 mg | ORAL_CAPSULE | Freq: Four times a day (QID) | ORAL | 0 refills | Status: DC
Start: 2015-08-14 — End: 2016-02-01

## 2015-08-14 MED ORDER — SODIUM CHLORIDE 0.9 % INJECTION
20 mg/2 mL | INTRAMUSCULAR | Status: AC
Start: 2015-08-14 — End: 2015-08-13
  Administered 2015-08-14: 01:00:00 via INTRAVENOUS

## 2015-08-14 MED ORDER — PROCHLORPERAZINE EDISYLATE 5 MG/ML INJECTION
5 mg/mL | INTRAMUSCULAR | Status: AC
Start: 2015-08-14 — End: 2015-08-13
  Administered 2015-08-14: 01:00:00 via INTRAVENOUS

## 2015-08-14 MED ORDER — SODIUM CHLORIDE 0.9% BOLUS IV
0.9 % | INTRAVENOUS | Status: AC
Start: 2015-08-14 — End: 2015-08-13
  Administered 2015-08-14: via INTRAVENOUS

## 2015-08-14 MED ORDER — ONDANSETRON 4 MG TAB, RAPID DISSOLVE
4 mg | ORAL_TABLET | Freq: Three times a day (TID) | ORAL | 0 refills | Status: DC | PRN
Start: 2015-08-14 — End: 2016-02-01

## 2015-08-14 MED FILL — SODIUM CHLORIDE 0.9 % IV: INTRAVENOUS | Qty: 1000

## 2015-08-14 MED FILL — FAMOTIDINE (PF) 20 MG/2 ML IV: 20 mg/2 mL | INTRAVENOUS | Qty: 2

## 2015-08-14 MED FILL — BENTYL 10 MG/ML INTRAMUSCULAR SOLUTION: 10 mg/mL | INTRAMUSCULAR | Qty: 2

## 2015-08-14 MED FILL — PROCHLORPERAZINE EDISYLATE 5 MG/ML INJECTION: 5 mg/mL | INTRAMUSCULAR | Qty: 2

## 2016-01-20 ENCOUNTER — Emergency Department: Admit: 2016-01-20 | Payer: MEDICAID | Primary: Family Medicine

## 2016-01-20 ENCOUNTER — Inpatient Hospital Stay
Admit: 2016-01-20 | Discharge: 2016-01-20 | Disposition: A | Discharge status: Home / Self Care | Payer: MEDICAID | Attending: Emergency Medicine

## 2016-01-20 DIAGNOSIS — N938 Other specified abnormal uterine and vaginal bleeding: Secondary | ICD-10-CM

## 2016-01-20 LAB — CBC WITH AUTOMATED DIFF
ABS. BASOPHILS: 0 10*3/uL (ref 0.0–0.1)
ABS. EOSINOPHILS: 0.2 10*3/uL (ref 0.0–0.4)
ABS. LYMPHOCYTES: 2.5 10*3/uL (ref 0.8–3.5)
ABS. MONOCYTES: 0.5 10*3/uL (ref 0.0–1.0)
ABS. NEUTROPHILS: 5.9 10*3/uL (ref 1.8–8.0)
BASOPHILS: 0 % (ref 0–1)
EOSINOPHILS: 2 % (ref 0–7)
HCT: 30.2 % — ABNORMAL LOW (ref 35.0–47.0)
HGB: 9.2 g/dL — ABNORMAL LOW (ref 11.5–16.0)
LYMPHOCYTES: 27 % (ref 12–49)
MCH: 23.4 PG — ABNORMAL LOW (ref 26.0–34.0)
MCHC: 30.5 g/dL (ref 30.0–36.5)
MCV: 76.6 FL — ABNORMAL LOW (ref 80.0–99.0)
MONOCYTES: 5 % (ref 5–13)
NEUTROPHILS: 66 % (ref 32–75)
PLATELET: 410 10*3/uL — ABNORMAL HIGH (ref 150–400)
RBC: 3.94 M/uL (ref 3.80–5.20)
RDW: 18.3 % — ABNORMAL HIGH (ref 11.5–14.5)
WBC: 9 10*3/uL (ref 3.6–11.0)

## 2016-01-20 LAB — METABOLIC PANEL, BASIC
Anion gap: 7 mmol/L (ref 5–15)
BUN/Creatinine ratio: 11 — ABNORMAL LOW (ref 12–20)
BUN: 9 MG/DL (ref 6–20)
CO2: 27 mmol/L (ref 21–32)
Calcium: 8.3 MG/DL — ABNORMAL LOW (ref 8.5–10.1)
Chloride: 102 mmol/L (ref 97–108)
Creatinine: 0.82 MG/DL (ref 0.55–1.02)
GFR est AA: >60 mL/min/{1.73_m2} (ref >60)
GFR est non-AA: >60 mL/min/{1.73_m2} (ref >60)
Glucose: 335 mg/dL — ABNORMAL HIGH (ref 65–100)
Potassium: 3.7 mmol/L (ref 3.5–5.1)
Sodium: 136 mmol/L (ref 136–145)

## 2016-01-20 LAB — URINALYSIS W/MICROSCOPIC
Bacteria: NEGATIVE /hpf
Bilirubin: NEGATIVE
Glucose: >1000 mg/dL — AB
Nitrites: NEGATIVE
Protein: 30 mg/dL — AB
RBC: >100 /hpf — ABNORMAL HIGH (ref 0–5)
Specific gravity: 1.015 (ref 1.003–1.030)
Urobilinogen: 0.2 EU/dL (ref 0.2–1.0)
pH (UA): 5.5 (ref 5.0–8.0)

## 2016-01-20 LAB — HCG URINE, QL. - POC: Pregnancy test,urine (POC): NEGATIVE

## 2016-01-20 MED ORDER — FERROUS SULFATE 324 MG (65 MG IRON) TAB, DELAYED RELEASE
324 mg (65 mg iron) | ORAL_TABLET | Freq: Every day | ORAL | 0 refills | Status: DC
Start: 2016-01-20 — End: 2016-07-01

## 2016-01-20 MED ORDER — SODIUM CHLORIDE 0.9% BOLUS IV
0.9 % | INTRAVENOUS | Status: AC
Start: 2016-01-20 — End: 2016-01-20
  Administered 2016-01-20: 16:00:00 via INTRAVENOUS

## 2016-01-20 MED ORDER — NORETHINDRONE-ETHINYL ESTRADIOL-IRON 1.5 MG-30 MCG TAB
1.5 mg-30 mcg (21)/75 mg (7) | PACK | Freq: Every day | ORAL | 0 refills | Status: DC
Start: 2016-01-20 — End: 2016-02-01

## 2016-01-20 MED ORDER — KETOROLAC TROMETHAMINE 30 MG/ML INJECTION
30 mg/mL (1 mL) | INTRAMUSCULAR | Status: AC
Start: 2016-01-20 — End: 2016-01-20
  Administered 2016-01-20: 16:00:00 via INTRAVENOUS

## 2016-01-20 MED FILL — SODIUM CHLORIDE 0.9 % IV: INTRAVENOUS | Qty: 1000

## 2016-01-20 MED FILL — KETOROLAC TROMETHAMINE 30 MG/ML INJECTION: 30 mg/mL (1 mL) | INTRAMUSCULAR | Qty: 1

## 2016-01-20 NOTE — ED Provider Notes (Signed)
HPI Comments: 35 y.o. female with past medical history significant for  DM, endometriosis, and appendectomy who presents from home with chief complaint of vaginal bleeding. The pt reports that she woke up to change her pad and tampon. The pt has a hx of heavy periods. The pt has had to change her pad and tampon every 2 hours due to the bleeding. This cycle started 10/1 The pt called her PCP, spoke with a nurse, and was advised to visit the ED. The reports that she had an ablation in July to try to correct the heavy cycles however it was not successful. The pt has been trying to find a new OBGYN because she was not happy with her old one. The pt denies SOB, lightheadedness, dizziness, vaginal discharge, concern for pregnancy, concern for STD, and prior admission.   There are no other acute medical concerns at this time.  Social hx: nonsmoker, no EtOH use  PCP: Sylvester Harder, MD  OBGYNErnest Mallick, MD    Note written by Kandace Parkins, Scribe, as dictated by Milon Dikes, MD 11:52 AM          The history is provided by the patient. No language interpreter was used.        Past Medical History:   Diagnosis Date   ??? Diabetes ([place])    ??? Ill-defined condition     endometriosis       Past Surgical History:   Procedure Laterality Date   ??? HX APPENDECTOMY     ??? HX GI      partial colectomy/ diverticulitis         No family history on file.    Social History     Social History   ??? Marital status: WIDOWED     Spouse name: N/A   ??? Number of children: N/A   ??? Years of education: N/A     Occupational History   ??? Not on file.     Social History Main Topics   ??? Smoking status: Never Smoker   ??? Smokeless tobacco: Not on file   ??? Alcohol use No   ??? Drug use: Not on file   ??? Sexual activity: Not on file     Other Topics Concern   ??? Not on file     Social History Narrative   ??? No narrative on file         ALLERGIES: Betadine [povidone-iodine]; Codeine; and Sulfa (sulfonamide antibiotics)    Review of Systems    Respiratory: Negative for shortness of breath.    Genitourinary: Positive for vaginal bleeding. Negative for vaginal discharge.   Neurological: Negative for dizziness and light-headedness.   All other systems reviewed and are negative.      Vitals:    01/20/16 1113   BP: 167/84   Pulse: (!) 102   Resp: 18   Temp: 99 ??F (37.2 ??C)   SpO2: 97%   Weight: 130.2 kg (287 lb)   Height: 5\' 3"  (1.6 m)            Physical Exam   Constitutional: She is oriented to person, place, and time. She appears well-developed and well-nourished. No distress.   HENT:   Head: Normocephalic and atraumatic.   Eyes: Conjunctivae are normal.   Neck: Normal range of motion.   Cardiovascular: Regular rhythm, normal heart sounds and intact distal pulses.  Exam reveals no friction rub.    No murmur heard.  tachycardic   Pulmonary/Chest:  Effort normal and breath sounds normal. No respiratory distress. She has no wheezes. She has no rales. She exhibits no tenderness.   Abdominal: Soft. Bowel sounds are normal. She exhibits no distension. There is no tenderness. There is no rebound and no guarding.   Genitourinary:   Genitourinary Comments: Moderate blood in vault cleared by 2 scoppettes; no cmt; no uterine enlargement   Musculoskeletal: Normal range of motion. She exhibits no edema or tenderness.   Neurological: She is alert and oriented to person, place, and time. Coordination normal.   Skin: Skin is warm and dry. She is not diaphoretic. No pallor.   Psychiatric: Her behavior is normal.   anxious   Nursing note and vitals reviewed.       MDM  Number of Diagnoses or Management Options  DUB (dysfunctional uterine bleeding):   Hyperglycemia:   Iron deficiency anemia, unspecified iron deficiency anemia type:   Uterine leiomyoma, unspecified location:   Diagnosis management comments: Pt with heavy cycle no hemorrhage on exam. Check hb, discuss with her gyn. Suspect sx are due to exogenous estrogen.  Will get [place] to eval for fibroids though doubt as had ablation about 2 months ago        Amount and/or Complexity of Data Reviewed  Clinical lab tests: ordered and reviewed  Tests in the radiology section of CPT??: ordered and reviewed  Discuss the patient with other providers: yes (ob)    Patient Progress  Patient progress: stable    ED Course       Procedures  1:16 PM  Spoke with dr Mateo Flow ob hospitalist. Suspects pt will be resistant to an ablation. Needs mirena iud. Start loestrin 30 mcg tablet. Single dose for now. If pt wants new follow up she can see [facility].      1:43 PM  Called the radiologist. They are reading the [place] now.     2:02 PM  Spoke with pt about results. Uterine fibroids likely contributing to bleeding as well. Hb stable from past and pt asymptomatic. Checked her pad and minimal blood was placed after [place] around 12:30 pm. Discussed return precautions as well. Pt agrees with plan

## 2016-01-20 NOTE — ED Notes (Signed)
Dr.dimiao reviewed discharge instructions with the patient.  The patient verbalized understanding.  All questions and concerns were addressed.  The patient declined a wheelchair and is discharged ambulatory in the care of family members with instructions and prescriptions in hand.  Pt is alert and oriented x 4.  Respirations are clear and unlabored.

## 2016-01-20 NOTE — ED Triage Notes (Signed)
Pt c/o having heavy vaginal bleeding. Has used multiple tampons this am that are overflowing onto pads. Sent by MD. Had ablation in July that was unsuccessful.

## 2016-01-21 LAB — TYPE AND SCREEN
ABO/Rh: AB POS
Antibody Screen: NEGATIVE

## 2016-01-21 LAB — TYPE & SCREEN
ABO/Rh(D): AB POS
Antibody screen: NEGATIVE

## 2016-01-25 ENCOUNTER — Ambulatory Visit
Admit: 2016-01-25 | Discharge: 2016-01-25 | Payer: PRIVATE HEALTH INSURANCE | Attending: Obstetrics & Gynecology | Primary: Family Medicine

## 2016-01-25 DIAGNOSIS — N920 Excessive and frequent menstruation with regular cycle: Secondary | ICD-10-CM

## 2016-01-25 NOTE — Progress Notes (Signed)
Pelvic Pain evaluation    Noha Shipps is a 35 y.o. female who complains of pelvic pain. She was seen in the ER about 5 days ago for vaginal bleeding.   She has a history of an ablation.  July at [facility].  The pain is described as shooting and cramping, and is 5/10 in intensity.   Pain is located in the LLQ without radiation.    Also USI--severe.     The pain started 2 days ago. Severe pain with crampy pain of passing clots.  Her symptoms have been stable since.   Aggravating factors: activity.    Alleviating factors: Toradol she had left over at home. Never tried the Mirena.  Associated symptoms: vaginal bleeding. 18 pads and tampons per period.  Pad lasts 1 1/2 hours at times.  The patient denies constipation and fever.    Sonogram performed in the ER showed:  IMPRESSION:  Multiple small uterine fibroids, measuring up to 3.2 cm. Normal endometrial  stripe. Left ovarian cyst is likely physiologic.  ??    Past Medical History:   Diagnosis Date   ??? Diabetes ([place])    ??? Endometriosis      Past Surgical History:   Procedure Laterality Date   ??? HX APPENDECTOMY     ??? HX GI      partial colectomy/ diverticulitis     Social History     Occupational History   ??? Not on file.     Social History Main Topics   ??? Smoking status: Never Smoker   ??? Smokeless tobacco: Never Used   ??? Alcohol use No   ??? Drug use: No   ??? Sexual activity: Yes     Partners: Male     Birth control/ protection: Pill     History reviewed. No pertinent family history.    Allergies   Allergen Reactions   ??? Betadine [Povidone-Iodine] Hives and Rash   ??? Codeine Nausea and Vomiting   ??? Sulfa (Sulfonamide Antibiotics) Rash     Prior to Admission medications    Medication Sig Start Date End Date Taking? Authorizing Provider   norethindrone-ethinyl estradiol-iron (LOESTRIN FE 1.5/30, 28-DAY,) 1.5 mg-30 mcg (21)/75 mg (7) tab Take 1 Tab by mouth daily. 01/20/16   Milon Dikes, MD    ferrous sulfate 324 mg (65 mg iron) tablet Take 1 Tab by mouth Daily (before breakfast). 01/20/16   Milon Dikes, MD   metFORMIN (GLUCOPHAGE) 1,000 mg tablet Take 1,000 mg by mouth two (2) times daily (with meals).    Phys Other, MD   SITagliptin (JANUVIA) 50 mg tablet Take 50 mg by mouth daily.    Phys Other, MD   glipiZIDE (GLUCOTROL) 10 mg tablet Take 10 mg by mouth daily.    Phys Other, MD   dicyclomine (BENTYL) 10 mg capsule Take 2 Caps by mouth four (4) times daily. 08/13/15   Dorian Furnace, PA   ondansetron (ZOFRAN ODT) 4 mg disintegrating tablet Take 1 Tab by mouth every eight (8) hours as needed for Nausea. 08/13/15   Dorian Furnace, PA        Review of Systems: History obtained from the patient  Constitutional: negative for weight loss, fever, night sweats  Breast: negative for breast lumps, nipple discharge, galactorrhea  GI: negative for change in bowel habits, abdominal pain, black or bloody stools  GU: negative for frequency, dysuria, hematuria, vaginal discharge  MSK: negative for back pain, joint pain, muscle pain  Skin: negative for itching, rash, hives  Psych: negative for anxiety, depression, change in mood      Objective:    Visit Vitals   ??? BP 132/76   ??? Ht 5\' 3"  (1.6 m)   ??? Wt 287 lb (130.2 kg)   ??? LMP 01/20/2016   ??? BMI 50.84 kg/m2       Physical Exam:     Constitutional  ?? Appearance: well-nourished, well developed, alert, in no acute distress    Gastrointestinal  ?? Abdominal Examination: abdomen non-tender to palpation, normal bowel sounds, no masses present  ?? Liver and spleen: no hepatomegaly present, spleen not palpable  ?? Hernias: no hernias identified    Genitourinary  ?? External Genitalia: normal appearance for age, no discharge present, no tenderness present, no inflammatory lesions present, no masses present, no atrophy present  ?? Vagina: normal vaginal vault without central or paravaginal defects, no discharge present, no inflammatory lesions present, no masses present   ?? Bladder: non-tender to palpation  ?? Urethra: appears normal  ?? Cervix: normal   ?? Uterus: upper normal size, shape and consistency  ?? Adnexa: no adnexal tenderness present, no adnexal masses present  ?? Perineum: perineum within normal limits, no evidence of trauma, no rashes or skin lesions present  ?? Anus: anus within normal limits, no hemorrhoids present  ?? Inguinal Lymph Nodes: no lymphadenopathy present    Skin  ?? General Inspection: no rash, no lesions identified    Neurologic/Psychiatric  ?? Mental Status:  ?? Orientation: grossly oriented to person, place and time  ?? Mood and Affect: mood normal, affect appropriate    Assessment:  Pelvic pain secondary to fibroids and menorrhagia.  Failed ablation, so not a candidate for Mirena.  USI.     Plan:   Needs URO, then [place], probable TVT-O.      RTO prn if symptoms persist or worsen.  Instructions given to pt.  Handouts given to pt.    I spent 45 minutes with the patient, more than half of which was face to face counseling.

## 2016-02-01 ENCOUNTER — Ambulatory Visit
Admit: 2016-02-01 | Discharge: 2016-02-01 | Payer: PRIVATE HEALTH INSURANCE | Attending: Internal Medicine | Primary: Family Medicine

## 2016-02-01 DIAGNOSIS — Z7689 Persons encountering health services in other specified circumstances: Secondary | ICD-10-CM

## 2016-02-01 LAB — AMB POC URINALYSIS DIP STICK AUTO W/O MICRO
Bilirubin (UA POC): NEGATIVE
Blood (UA POC): NEGATIVE
Glucose (UA POC): NEGATIVE
Ketones (UA POC): NEGATIVE
Leukocyte esterase (UA POC): NEGATIVE
Nitrites (UA POC): NEGATIVE
Protein (UA POC): NEGATIVE mg/dL
Specific gravity (UA POC): 1.03 (ref 1.001–1.035)
Urobilinogen (UA POC): 0.2 (ref 0.2–1)
pH (UA POC): 5.5 (ref 4.6–8.0)

## 2016-02-01 MED ORDER — FLUCONAZOLE 150 MG TAB
150 mg | ORAL_TABLET | Freq: Every day | ORAL | 0 refills | Status: AC
Start: 2016-02-01 — End: 2016-02-02

## 2016-02-01 MED ORDER — GLIPIZIDE 10 MG TAB
10 mg | ORAL_TABLET | Freq: Two times a day (BID) | ORAL | 3 refills | Status: DC
Start: 2016-02-01 — End: 2016-06-13

## 2016-02-01 MED ORDER — FAMOTIDINE 40 MG TAB
40 mg | ORAL_TABLET | Freq: Every day | ORAL | 4 refills | Status: DC
Start: 2016-02-01 — End: 2016-04-26

## 2016-02-01 MED ORDER — LISINOPRIL 10 MG TAB
10 mg | ORAL_TABLET | Freq: Every day | ORAL | 3 refills | Status: DC
Start: 2016-02-01 — End: 2016-06-13

## 2016-02-01 MED ORDER — SITAGLIPTIN-METFORMIN 50 MG-1,000 MG TAB
50-1000 mg | ORAL_TABLET | Freq: Two times a day (BID) | ORAL | 3 refills | Status: DC
Start: 2016-02-01 — End: 2016-06-13

## 2016-02-01 NOTE — Progress Notes (Signed)
Alyssa [name] is a 35 y.o. female who presents today for Establish Care; Urinary Burning (started 1 week ago); and Urinary Frequency (also reports pain )  .      She has a history of   Patient Active Problem List   Diagnosis Code   ??? Controlled type 2 diabetes mellitus with complication, without long-term current use of insulin ([place]) E11.8   ??? Anemia D64.9   ??? Essential hypertension I10   ??? Hyperlipidemia E78.5   .    Today patient is here to establish care.   Previous PCP Nicola Police. she is switching because personal preference.  Records are not available for me to review.     she does have other concerns.    Urinary Problems:  Alyssa [name] is a 35 y.o. female who complains of dysuria, burning x 7 days, without flank pain, fever, chills, nausea or vomiting.  Urine has not been cloudy/foul smelling.  her symptoms are moderate.  she is drinking plenty of fluids for hydration.   her history is significant for: UTI's in the past.      Has been using some ointment    reports that she currently engages in sexual activity and has had female partners. She reports using the following method of birth control/protection: Pill..    Patient's last menstrual period was 01/17/2016 (exact date).    DM: for some time. Pt taking januvia, metformin and glipizide. Notes that this was not too well controlled. Seems to be in the low 200's.   Taking glipizide 1-2 5mg  tablets.     Anemia: likely due to fibroids. Having Hyst done on 11/27 with Dr. Reymundo Poll.     HTN: On ACEi, 10mg .     HLD: On statin.     Social: Psychologist, sport and exercise at [employer]     Lives with aunt at home.     Two children. (49yr girl and 46 son).      No tobacco, no EtOH.       Family:  Diabetes,      Mother's side with breast cancer.        ROS  Review of Systems   Constitutional: Negative for chills, fever and weight loss.   Eyes: Negative for blurred vision, double vision, photophobia and pain.   Respiratory: Negative for cough and shortness of breath.     Cardiovascular: Negative for chest pain, palpitations and leg swelling.   Gastrointestinal: Negative for abdominal pain, diarrhea, heartburn, nausea and vomiting.   Genitourinary: Positive for dysuria. Negative for frequency and urgency.   Neurological: Negative.    Endo/Heme/Allergies: Does not bruise/bleed easily.   Psychiatric/Behavioral: Negative for depression. The patient is not nervous/anxious.        Visit Vitals   ??? BP 137/85 (BP 1 Location: Left arm, BP Patient Position: Sitting)   ??? Pulse 99   ??? Temp 99 ??F (37.2 ??C) (Oral)   ??? Resp 12   ??? Ht 5\' 3"  (1.6 m)   ??? Wt 265 lb (120.2 kg)   ??? SpO2 96%   ??? BMI 46.94 kg/m2       Physical Exam   Constitutional: She is oriented to person, place, and time and well-developed, well-nourished, and in no distress. No distress.   HENT:   Head: Normocephalic and atraumatic.   Mouth/Throat: No oropharyngeal exudate.   Eyes: Conjunctivae are normal. Pupils are equal, round, and reactive to light. No scleral icterus.   Neck: Normal range of motion. No JVD present. No  thyromegaly present.   Cardiovascular: Normal rate and regular rhythm.  Exam reveals no gallop and no friction rub.    No murmur heard.  Pulmonary/Chest: Effort normal and breath sounds normal. No respiratory distress. She has no wheezes.   Abdominal: Soft. Bowel sounds are normal. She exhibits no distension. There is no rebound and no guarding.   Musculoskeletal: Normal range of motion. She exhibits no edema.   Neurological: She is alert and oriented to person, place, and time. No cranial nerve deficit.   Skin: Skin is dry. No rash noted.   Psychiatric: Mood, memory, affect and judgment normal.       Current Outpatient Prescriptions   Medication Sig   ??? atorvastatin (LIPITOR) 10 mg tablet Take  by mouth daily.   ??? aspirin delayed-release 81 mg tablet Take  by mouth daily.   ??? aspirin-acetaminophen-caffeine (EXCEDRIN ES) 250-250-65 mg per tablet Take 1 Tab by mouth.    ??? ascorbic acid, vitamin C, (VITAMIN C) 250 mg tablet Take  by mouth.   ??? miconazole (MONISTAT 7) 2 % vaginal cream Insert 1 Applicator into vagina nightly.   ??? fluconazole (DIFLUCAN) 150 mg tablet Take 1 Tab by mouth daily for 1 day. FDA advises cautious prescribing of oral fluconazole in pregnancy.   ??? SITagliptin-metFORMIN (JANUMET) 50-1,000 mg per tablet Take 1 Tab by mouth two (2) times daily (with meals).   ??? glipiZIDE (GLUCOTROL) 10 mg tablet Take 1 Tab by mouth two (2) times a day.   ??? lisinopril (PRINIVIL, ZESTRIL) 10 mg tablet Take 1 Tab by mouth daily.   ??? famotidine (PEPCID) 40 mg tablet Take 1 Tab by mouth daily.   ??? ferrous sulfate 324 mg (65 mg iron) tablet Take 1 Tab by mouth Daily (before breakfast).     No current facility-administered medications for this visit.         Past Medical History:   Diagnosis Date   ??? Diabetes ([place])    ??? Endometriosis    ??? Hypertension       Past Surgical History:   Procedure Laterality Date   ??? HX APPENDECTOMY     ??? HX CESAREAN SECTION      x 2   ??? HX GI  2013    Laparoscopic partial colectomy/ diverticulitis      Social History   Substance Use Topics   ??? Smoking status: Never Smoker   ??? Smokeless tobacco: Never Used   ??? Alcohol use No      Family History   Problem Relation Age of Onset   ??? Hypertension Mother    ??? Hypertension Father    ??? Breast Cancer Other         Allergies   Allergen Reactions   ??? Betadine [Povidone-Iodine] Hives and Rash   ??? Codeine Nausea and Vomiting   ??? Sulfa (Sulfonamide Antibiotics) Rash        Assessment/Plan  Diagnoses and all orders for this visit:    1. Encounter to establish care with new doctor - get old records.     2. Urinary pain - UA clear. Likely fungal.   -     AMB POC URINALYSIS DIP STICK AUTO W/O MICRO    3. Type 2 diabetes mellitus with complication, without long-term current use of insulin ([facility]) - sounds like she is not very well controlled. Combine Januvia and metformin to help with pill burden. Discussed weight  loss and exercise. Increase glipizide to 10mg  PO BID.   -  HEMOGLOBIN A1C WITH EAG  -     LIPID PANEL  -     MICROALBUMIN, UR, RAND W/ MICROALBUMIN/CREA RATIO  -     METABOLIC PANEL, COMPREHENSIVE  -     SITagliptin-metFORMIN (JANUMET) 50-1,000 mg per tablet; Take 1 Tab by mouth two (2) times daily (with meals).  -     glipiZIDE (GLUCOTROL) 10 mg tablet; Take 1 Tab by mouth two (2) times a day.    4. Anemia, unspecified type - due to fibroids. WIll be having hysterectomy in Nov.     5. Essential hypertension - Stay on 10mg .   -     lisinopril (PRINIVIL, ZESTRIL) 10 mg tablet; Take 1 Tab by mouth daily.    6. Hyperlipidemia, unspecified hyperlipidemia type - on statin.     7. Yeast infection  -     fluconazole (DIFLUCAN) 150 mg tablet; Take 1 Tab by mouth daily for 1 day. FDA advises cautious prescribing of oral fluconazole in pregnancy.    8. Gastroesophageal reflux disease, esophagitis presence not specified - refill.   -     famotidine (PEPCID) 40 mg tablet; Take 1 Tab by mouth daily.        Follow-up Disposition:  Return in about 2 months (around 04/02/2016) for Physical.    [name], MD  02/01/2016

## 2016-02-04 LAB — LIPID PANEL
Cholesterol, total: 120 mg/dL (ref 100–199)
HDL Cholesterol: 47 mg/dL (ref >39)
LDL, calculated: 51 mg/dL (ref 0–99)
Triglyceride: 109 mg/dL (ref 0–149)
VLDL, calculated: 22 mg/dL (ref 5–40)

## 2016-02-04 LAB — METABOLIC PANEL, COMPREHENSIVE
A-G Ratio: 1.3 (ref 1.2–2.2)
ALT (SGPT): 14 IU/L (ref 0–32)
AST (SGOT): 14 IU/L (ref 0–40)
Albumin: 4.2 g/dL (ref 3.5–5.5)
Alk. phosphatase: 60 IU/L (ref 39–117)
BUN/Creatinine ratio: 16 (ref 9–23)
BUN: 9 mg/dL (ref 6–20)
Bilirubin, total: 0.3 mg/dL (ref 0.0–1.2)
CO2: 24 mmol/L (ref 18–29)
Calcium: 9.2 mg/dL (ref 8.7–10.2)
Chloride: 95 mmol/L — ABNORMAL LOW (ref 96–106)
Creatinine: 0.57 mg/dL (ref 0.57–1.00)
GFR est AA: 138 mL/min/{1.73_m2} (ref >59)
GFR est non-AA: 120 mL/min/{1.73_m2} (ref >59)
GLOBULIN, TOTAL: 3.2 g/dL (ref 1.5–4.5)
Glucose: 153 mg/dL — ABNORMAL HIGH (ref 65–99)
Potassium: 4.8 mmol/L (ref 3.5–5.2)
Protein, total: 7.4 g/dL (ref 6.0–8.5)
Sodium: 138 mmol/L (ref 134–144)

## 2016-02-04 LAB — MICROALBUMIN, UR, RAND W/ MICROALB/CREAT RATIO
Creatinine, urine random: 146.1 mg/dL
Microalb/Creat ratio (ug/mg creat.): 7.7 mg/g creat (ref 0.0–30.0)
Microalbumin, urine: 11.3 ug/mL

## 2016-02-04 LAB — CVD REPORT

## 2016-02-04 LAB — DIABETES PATIENT EDUCATION

## 2016-02-04 LAB — HEMOGLOBIN A1C WITH EAG
Estimated average glucose: 214 mg/dL
Hemoglobin A1c: 9.1 % — ABNORMAL HIGH (ref 4.8–5.6)

## 2016-02-06 NOTE — Progress Notes (Signed)
Letter sent.

## 2016-02-18 ENCOUNTER — Encounter: Attending: Obstetrics & Gynecology | Primary: Family Medicine

## 2016-02-20 ENCOUNTER — Inpatient Hospital Stay
Admit: 2016-02-20 | Discharge: 2016-02-20 | Disposition: A | Discharge status: Home / Self Care | Payer: MEDICAID | Attending: Emergency Medicine

## 2016-02-20 DIAGNOSIS — N921 Excessive and frequent menstruation with irregular cycle: Secondary | ICD-10-CM

## 2016-02-20 LAB — CBC WITH AUTOMATED DIFF
ABS. BASOPHILS: 0 10*3/uL (ref 0.0–0.1)
ABS. EOSINOPHILS: 0.2 10*3/uL (ref 0.0–0.4)
ABS. LYMPHOCYTES: 2.1 10*3/uL (ref 0.8–3.5)
ABS. MONOCYTES: 0.4 10*3/uL (ref 0.0–1.0)
ABS. NEUTROPHILS: 4.5 10*3/uL (ref 1.8–8.0)
BASOPHILS: 1 % (ref 0–1)
EOSINOPHILS: 3 % (ref 0–7)
HCT: 30 % — ABNORMAL LOW (ref 35.0–47.0)
HGB: 9.4 g/dL — ABNORMAL LOW (ref 11.5–16.0)
LYMPHOCYTES: 29 % (ref 12–49)
MCH: 23.6 PG — ABNORMAL LOW (ref 26.0–34.0)
MCHC: 31.3 g/dL (ref 30.0–36.5)
MCV: 75.2 FL — ABNORMAL LOW (ref 80.0–99.0)
MONOCYTES: 5 % (ref 5–13)
NEUTROPHILS: 62 % (ref 32–75)
PLATELET: 472 10*3/uL — ABNORMAL HIGH (ref 150–400)
RBC: 3.99 M/uL (ref 3.80–5.20)
RDW: 17.5 % — ABNORMAL HIGH (ref 11.5–14.5)
WBC: 7.2 10*3/uL (ref 3.6–11.0)

## 2016-02-20 LAB — HCG URINE, QL. - POC: Pregnancy test,urine (POC): NEGATIVE

## 2016-02-20 MED ORDER — KETOROLAC TROMETHAMINE 30 MG/ML INJECTION
30 mg/mL (1 mL) | Freq: Once | INTRAMUSCULAR | Status: AC
Start: 2016-02-20 — End: 2016-02-20
  Administered 2016-02-20: 16:00:00 via INTRAVENOUS

## 2016-02-20 MED ORDER — ESTRADIOL 1 MG TAB
1 mg | ORAL_TABLET | Freq: Every day | ORAL | 0 refills | Status: AC
Start: 2016-02-20 — End: 2016-03-01

## 2016-02-20 MED FILL — KETOROLAC TROMETHAMINE 30 MG/ML INJECTION: 30 mg/mL (1 mL) | INTRAMUSCULAR | Qty: 1

## 2016-02-20 NOTE — ED Notes (Signed)
Introduced self as primary nurse. Discussed plan of care with patient. Bed in low locked position and call bell in reach. Opportunity to ask questions given. Patient advised that medical information will be discussed at this time and it is their own responsibility to let nurse know if such conversation should not take place in presence of visitors. Pt verbalizes understanding.

## 2016-02-20 NOTE — ED Triage Notes (Signed)
Pt here with heavy vaginal bleeding since Oct 26th. Pt is having hysterectomy early December for the heavy bleeding. Reports an Ultra tampon every 2 hours and pad every 6.

## 2016-02-20 NOTE — ED Provider Notes (Addendum)
Patient is a 35 y.o. female presenting with vaginal bleeding. The history is provided by the patient.   Vaginal Bleeding   This is a chronic problem. The current episode started more than 1 week ago. The problem occurs constantly. The problem has not changed since onset.Pertinent negatives include no shortness of breath. Nothing aggravates the symptoms. Nothing relieves the symptoms. She has tried nothing for the symptoms.   Pt with h/o fibroids and heavy menstrual periods. Pt is scheduled for hysterectomy with Dr. Reymundo Poll on December. She's currently not taking any contraceptive method.  Pt reports that her period started on 10/26 and that around 10/31 it got worse, she was changing her tampons Q2H. Today she's still changing her tampons Q2H and pads Q6H. Pt endorses cramping and having the sensation of clots coming down. Pt denies SOB, lightheadedness, dizziness, CP, palpitations.    Past Medical History:   Diagnosis Date   ??? Diabetes ([place])    ??? Endometriosis    ??? Hypertension        Past Surgical History:   Procedure Laterality Date   ??? HX APPENDECTOMY     ??? HX CESAREAN SECTION      x 2   ??? HX GI  2013    Laparoscopic partial colectomy/ diverticulitis         Family History:   Problem Relation Age of Onset   ??? Hypertension Mother    ??? Hypertension Father    ??? Breast Cancer Other        Social History     Social History   ??? Marital status: WIDOWED     Spouse name: N/A   ??? Number of children: N/A   ??? Years of education: N/A     Occupational History   ??? Not on file.     Social History Main Topics   ??? Smoking status: Never Smoker   ??? Smokeless tobacco: Never Used   ??? Alcohol use No   ??? Drug use: No   ??? Sexual activity: Yes     Partners: Male     Birth control/ protection: Pill     Other Topics Concern   ??? Not on file     Social History Narrative         ALLERGIES: Betadine [povidone-iodine]; Codeine; [place] leaf-tree; and Sulfa (sulfonamide antibiotics)    Review of Systems   Constitutional: Negative.     HENT: Negative.    Respiratory: Negative.  Negative for shortness of breath.    Cardiovascular: Negative.    Gastrointestinal: Negative.    Genitourinary: Positive for vaginal bleeding. Negative for vaginal discharge and vaginal pain.   Musculoskeletal: Negative.    Skin: Negative.    Neurological: Negative.        Vitals:    02/20/16 1118   BP: 138/68   Pulse: 93   Resp: 16   Temp: 98.6 ??F (37 ??C)   SpO2: 98%   Weight: 120.7 kg (266 lb)   Height: 5\' 3"  (1.6 m)            Physical Exam   Constitutional: She is oriented to person, place, and time. She appears well-developed and well-nourished. No distress.   HENT:   Head: Normocephalic and atraumatic.   Eyes: Pupils are equal, round, and reactive to light.   Cardiovascular: Normal rate and regular rhythm.  Exam reveals no gallop and no friction rub.    No murmur heard.  Pulmonary/Chest: Effort normal and breath sounds normal. No  respiratory distress. She has no wheezes. She has no rales.   Abdominal: Soft. Bowel sounds are normal. She exhibits no distension.   Genitourinary: There is bleeding in the vagina. No vaginal discharge found.   Musculoskeletal: Normal range of motion.   Neurological: She is alert and oriented to person, place, and time.   Skin: Skin is warm and dry.        MDM  Number of Diagnoses or Management Options  established and worsening     Amount and/or Complexity of Data Reviewed  Clinical lab tests: ordered      ED Course     Hgb stable. Pt on Iron at home. Called OB hospitalist who recommended to put pt on estradiol 2 mg for 10 days and then start birth control pills until surgery.  Prescribed Estradiol 2 mg for 10 days, pt to follow up with Dr. Reymundo Poll before the 10 days pass to see if they want to continue birth control. Pt encouraged to continue Iron.   Discharged home.     Procedures

## 2016-02-22 MED ORDER — DESOGESTREL-ETHINYL ESTRADIOL 0.15 MG-30 MCG TAB
Freq: Every day | ORAL | 0 refills | Status: DC
Start: 2016-02-22 — End: 2016-03-21

## 2016-02-22 NOTE — Telephone Encounter (Signed)
Patient calling stating that her surgery was rescheduled from 03/14/16 to 03/23/16 and wanted to know why her bowel prep rx was not called into the pharmacy.  Patient advised that her bowel prep rx will be called in 24-48 hours prior to surgery and if she has not had the rx by the morning of the 03/22/16, please contact our office.  Patient verbalized understanding.

## 2016-02-22 NOTE — Telephone Encounter (Signed)
Patient advised of MD recommendations and advised of prescription sent by MD to patient preferred pharmacy. Patient verbalized understanding.        FYI  Patient is schedule for urodynamics on 03/21/16 at 2:20PM

## 2016-02-22 NOTE — Telephone Encounter (Signed)
I don't see her scheduled for Urodynamics.  She needs to come in before her surgery for that.  To answer the question, I agree she should go on the pill.  She can start that now WHILE she is taking the estrace.  Rx [facility]--sent

## 2016-02-22 NOTE — Telephone Encounter (Signed)
Call received at 59:33am    35 year old patient last seen in the office on 01/25/16. Patient calling to say that she was seen in the ER on 02/20/16 for heavy bleeding , fainting and feeling dizzy. Patient state she was given Estrace 1mg  tablets to take 2mg  per day for 10 days. Patient states the bleeding is slowing down. Patient states she was told she needs to take birth control pills after the estrace other wise she will have heavy bleeding again.     Patient was advised to call the office.  Patient is schedule for surgery on 03/23/16.      ? Need birth control prescription         Please advise

## 2016-02-24 ENCOUNTER — Encounter: Attending: Internal Medicine | Primary: Family Medicine

## 2016-02-24 ENCOUNTER — Encounter

## 2016-02-25 NOTE — Addendum Note (Signed)
Addended by: Earna Coder on: 02/25/2016 10:44 AM      Modules accepted: Orders

## 2016-03-01 ENCOUNTER — Encounter: Attending: Internal Medicine | Primary: Family Medicine

## 2016-03-04 ENCOUNTER — Ambulatory Visit: Admit: 2016-03-04 | Payer: PRIVATE HEALTH INSURANCE | Attending: Internal Medicine | Primary: Family Medicine

## 2016-03-04 DIAGNOSIS — D649 Anemia, unspecified: Secondary | ICD-10-CM

## 2016-03-04 MED ORDER — EMPAGLIFLOZIN 25 MG TABLET
25 mg | ORAL_TABLET | Freq: Every day | ORAL | 4 refills | Status: DC
Start: 2016-03-04 — End: 2016-11-12

## 2016-03-04 NOTE — Patient Instructions (Signed)
Empagliflozin (By mouth)   Empagliflozin (em-pa-gli-FLOE-zin)  Treats type 2 diabetes. Also lowers risk of death in patients with type 2 diabetes and heart or blood vessel problems.   Brand Name(s): Jardiance   There may be other brand names for this medicine.  When This Medicine Should Not Be Used:   This medicine is not right for everyone. Do not use it if you had an allergic reaction to empagliflozin.  How to Use This Medicine:   Tablet  ?? Take your medicine as directed. Your dose may need to be changed several times to find what works best for you. This medicine is usually taken in the morning.  ?? Read and follow the patient instructions that come with this medicine. Talk to your doctor or pharmacist if you have any questions.  ?? Missed dose: Take a dose as soon as you remember. If it is almost time for your next dose, wait until then and take a regular dose. Do not take extra medicine to make up for a missed dose.  ?? Store the medicine in a closed container at room temperature, away from heat, moisture, and direct light.  Drugs and Foods to Avoid:   Ask your doctor or pharmacist before using any other medicine, including over-the-counter medicines, vitamins, and herbal products.  ?? Some medicines can affect how empagliflozin works. Tell your doctor if you are using any of the following:  ?? Diuretic (water pill)  ?? Insulin or another diabetes medicine  Warnings While Using This Medicine:   ?? Tell your doctor if you are pregnant or breastfeeding, or if you have kidney disease, liver problems, congestive heart failure, high cholesterol, or a history of pancreas problems or genital yeast or urinary tract infections. Tell your doctor if you are on a low-salt diet or if you drink alcohol.  ?? This medicine may cause the following problems:  ?? Low blood pressure  ?? Ketoacidosis (high ketones and acid in the blood)  ?? Low blood sugar  ?? Kidney problems  ?? Increased risk of genital yeast or urinary tract infections   ?? Tell any doctor or dentist who treats you that you are using this medicine. This medicine may affect certain medical test results. This medicine may affect the results of urine glucose tests.  ?? Your doctor will do lab tests at regular visits to check on the effects of this medicine. Keep all appointments.  ?? Keep all medicine out of the reach of children. Never share your medicine with anyone.  Possible Side Effects While Using This Medicine:   Call your doctor right away if you notice any of these side effects:  ?? Allergic reaction: Itching or hives, swelling in your face or hands, swelling or tingling in your mouth or throat, chest tightness, trouble breathing  ?? Change in how much or how often you urinate, bloody or cloudy urine, painful or difficult urination, lower back or side pain  ?? Increased hunger, confusion, shaking, trembling, sweating  ?? Lightheadedness, dizziness, fainting  ?? Trouble breathing, tiredness, stomach pain, nausea, vomiting  If you notice these less serious side effects, talk with your doctor:   ?? Redness, itching, pain, or swelling of the penis, bad-smelling discharge from the penis  ?? White or yellow vaginal discharge, vaginal itching or odor  If you notice other side effects that you think are caused by this medicine, tell your doctor.   Call your doctor for medical advice about side effects. You may report side effects  to FDA at 1-800-FDA-1088  ?? 2017 [facility] Information is for End User's use only and may not be sold, redistributed or otherwise used for commercial purposes.  The above information is an educational aid only. It is not intended as medical advice for individual conditions or treatments. Talk to your doctor, nurse or pharmacist before following any medical regimen to see if it is safe and effective for you.

## 2016-03-04 NOTE — Progress Notes (Signed)
Alyssa [name] is a 35 y.o. female who presents today for Fatigue; Shortness of Breath; and Snoring  .      She has a history of   Patient Active Problem List   Diagnosis Code   ??? Controlled type 2 diabetes mellitus with complication, without long-term current use of insulin ([place]) E11.8   ??? Anemia D64.9   ??? Essential hypertension I10   ??? Hyperlipidemia E78.5   .    Today patient is here for f/u.   she does have other concerns.    Snoring. Pt has been snoring a good bit. Easily falls asleep. Does not stop breathing.     Diabetes Mellitus follow-up - combined Januvia and Met. Also increased Glipizide. Since she notes no fasting readings <200.  Weight is down some.   Will try adding farxiga.   Last hemoglobin a1c   Lab Results   Component Value Date/Time    Hemoglobin A1c 9.1 02/03/2016 09:01 AM     No components found for: POCA1C, HBA1C POC  medication compliance: compliant all of the time.    Diabetic diet compliance: compliant most of the time.   Home glucose monitoring: fasting values range low 200's.     Eye exan due,.   Microalbuminuria:   Lab Results   Component Value Date/Time    Microalb/Creat ratio (ug/mg creat.) 7.7 02/03/2016 09:01 AM     Hypertension - Increased to 47m.   Hypertension ROS: taking medications as instructed, no medication side effects noted, no TIA's, no chest pain on exertion, no dyspnea on exertion, no swelling of ankles     reports that she has never smoked. She has never used smokeless tobacco.    reports that she does not drink alcohol.   BP Readings from Last 2 Encounters:   03/04/16 121/71   02/20/16 128/70     Anemia: Due to DUB. Hyst scheduled for     ROS  Review of Systems   Constitutional: Positive for malaise/fatigue. Negative for chills, fever and weight loss.   Respiratory: Negative for cough and shortness of breath.    Cardiovascular: Negative for chest pain, palpitations and leg swelling.   Gastrointestinal: Negative for abdominal pain, nausea and vomiting.    Genitourinary: Negative for dysuria, frequency and urgency.   Neurological: Negative.    Endo/Heme/Allergies: Does not bruise/bleed easily.   Psychiatric/Behavioral: Negative for depression, hallucinations, substance abuse and suicidal ideas. The patient is not nervous/anxious.        Visit Vitals   ??? BP 121/71 (BP 1 Location: Left arm, BP Patient Position: Sitting)   ??? Pulse 100   ??? Temp 99.4 ??F (37.4 ??C) (Oral)   ??? Resp 18   ??? Ht 5' 3"  (1.6 m)   ??? Wt 267 lb (121.1 kg)   ??? SpO2 97%   ??? BMI 47.3 kg/m2       Physical Exam   Constitutional: She is oriented to person, place, and time. She appears well-developed and well-nourished.   HENT:   Head: Normocephalic and atraumatic.   Cardiovascular: Normal rate and regular rhythm.    No murmur heard.  Pulmonary/Chest: Effort normal. No respiratory distress.   Neurological: She is alert and oriented to person, place, and time.   Skin: Skin is warm and dry.   Foot exam  - skin intact, significant dryness w no fissures, no rashes, no significant ulcers or callous formation. Sensation intact by microfilament or light touch     Psychiatric: She has a normal  mood and affect. Her behavior is normal.         Current Outpatient Prescriptions   Medication Sig   ??? estradiol (ESTRACE) 1 mg tablet Take 1 mg by mouth two (2) times a day.   ??? ketorolac (TORADOL) 10 mg tablet Take  by mouth.   ??? empagliflozin (JARDIANCE) 25 mg tablet Take 1 Tab by mouth daily.   ??? desogestrel-ethinyl estradiol (DESOGEN) 0.15-0.03 mg tab Take 1 Tab by mouth daily.   ??? loratadine (CLARITIN) 10 mg tablet Take 10 mg by mouth.   ??? atorvastatin (LIPITOR) 10 mg tablet Take  by mouth daily.   ??? ascorbic acid, vitamin C, (VITAMIN C) 250 mg tablet Take  by mouth.   ??? miconazole (MONISTAT 7) 2 % vaginal cream Insert 1 Applicator into vagina nightly.   ??? SITagliptin-metFORMIN (JANUMET) 50-1,000 mg per tablet Take 1 Tab by mouth two (2) times daily (with meals).    ??? glipiZIDE (GLUCOTROL) 10 mg tablet Take 1 Tab by mouth two (2) times a day.   ??? lisinopril (PRINIVIL, ZESTRIL) 10 mg tablet Take 1 Tab by mouth daily.   ??? famotidine (PEPCID) 40 mg tablet Take 1 Tab by mouth daily.   ??? ferrous sulfate 324 mg (65 mg iron) tablet Take 1 Tab by mouth Daily (before breakfast).   ??? aspirin-acetaminophen-caffeine (EXCEDRIN ES) 250-250-65 mg per tablet Take 1 Tab by mouth.     No current facility-administered medications for this visit.         Past Medical History:   Diagnosis Date   ??? Diabetes ([place])    ??? Endometriosis    ??? Hypertension       Past Surgical History:   Procedure Laterality Date   ??? HX APPENDECTOMY     ??? HX CESAREAN SECTION      x 2   ??? HX GI  2013    Laparoscopic partial colectomy/ diverticulitis      Social History   Substance Use Topics   ??? Smoking status: Never Smoker   ??? Smokeless tobacco: Never Used   ??? Alcohol use No      Family History   Problem Relation Age of Onset   ??? Hypertension Mother    ??? Hypertension Father    ??? Breast Cancer Other         Allergies   Allergen Reactions   ??? Betadine [Povidone-Iodine] Hives and Rash   ??? Codeine Nausea and Vomiting   ??? [place] Leaf-Tree Hives   ??? Sulfa (Sulfonamide Antibiotics) Rash        Assessment/Plan  Diagnoses and all orders for this visit:    1. Anemia, unspecified type - 2/2 DUB. Having Hyst in Dec.   -     CBC WITH AUTOMATED DIFF; Future    2. DUB (dysfunctional uterine bleeding)    3. Hyperlipidemia, unspecified hyperlipidemia type - stable.     4. Essential hypertension - good control on ACEi.   -     METABOLIC PANEL, BASIC; Future    5. Type 2 diabetes mellitus with complication, without long-term current use of insulin ([facility]) - not controlled. Discussed options (insulin vs. SGLT2i) will add jardiance. S/e discussed. May need prior auth. Continue Met/GLP-1 glipizide.   -     empagliflozin (JARDIANCE) 25 mg tablet; Take 1 Tab by mouth daily.  -     HEMOGLOBIN A1C WITH EAG; Future        Follow-up Disposition:   Return in about 2 months (  around 05/04/2016).    [name], MD  03/04/2016

## 2016-03-07 ENCOUNTER — Encounter: Primary: Family Medicine

## 2016-03-14 ENCOUNTER — Encounter: Attending: Obstetrics & Gynecology | Primary: Family Medicine

## 2016-03-17 MED ORDER — LORATADINE 10 MG TAB
10 mg | ORAL_TABLET | ORAL | 4 refills | Status: DC
Start: 2016-03-17 — End: 2016-03-21

## 2016-03-17 MED ORDER — PEG 3350-ELECTROLYTES 236 G-22.74 G-6.74 G-5.86 G ORAL SOLUTION
ORAL | 0 refills | Status: AC
Start: 2016-03-17 — End: 2016-03-17

## 2016-03-17 NOTE — Telephone Encounter (Signed)
Prescription sent as per MD order to patient preferred pharmacy

## 2016-03-21 ENCOUNTER — Ambulatory Visit: Attending: Obstetrics & Gynecology | Primary: Family Medicine

## 2016-03-21 ENCOUNTER — Encounter

## 2016-03-21 ENCOUNTER — Ambulatory Visit
Admit: 2016-03-21 | Discharge: 2016-03-21 | Payer: PRIVATE HEALTH INSURANCE | Attending: Obstetrics & Gynecology | Primary: Family Medicine

## 2016-03-21 ENCOUNTER — Inpatient Hospital Stay: Admit: 2016-03-21 | Payer: MEDICAID | Primary: Family Medicine

## 2016-03-21 ENCOUNTER — Encounter: Primary: Family Medicine

## 2016-03-21 DIAGNOSIS — N393 Stress incontinence (female) (male): Secondary | ICD-10-CM

## 2016-03-21 DIAGNOSIS — Z01818 Encounter for other preprocedural examination: Secondary | ICD-10-CM

## 2016-03-21 LAB — TYPE AND SCREEN
ABO/Rh: AB POS
Antibody Screen: NEGATIVE

## 2016-03-21 LAB — TYPE & SCREEN
ABO/Rh(D): AB POS
Antibody screen: NEGATIVE

## 2016-03-21 LAB — METABOLIC PANEL, BASIC
Anion gap: 9 mmol/L (ref 5–15)
BUN/Creatinine ratio: 10 — ABNORMAL LOW (ref 12–20)
BUN: 7 MG/DL (ref 6–20)
CO2: 25 mmol/L (ref 21–32)
Calcium: 8.8 MG/DL (ref 8.5–10.1)
Chloride: 103 mmol/L (ref 97–108)
Creatinine: 0.68 MG/DL (ref 0.55–1.02)
GFR est AA: >60 mL/min/{1.73_m2} (ref >60)
GFR est non-AA: >60 mL/min/{1.73_m2} (ref >60)
Glucose: 189 mg/dL — ABNORMAL HIGH (ref 65–100)
Potassium: 4.5 mmol/L (ref 3.5–5.1)
Sodium: 137 mmol/L (ref 136–145)

## 2016-03-21 LAB — CBC W/O DIFF
HCT: 31.8 % — ABNORMAL LOW (ref 35.0–47.0)
HGB: 9.8 g/dL — ABNORMAL LOW (ref 11.5–16.0)
MCH: 22.7 PG — ABNORMAL LOW (ref 26.0–34.0)
MCHC: 30.8 g/dL (ref 30.0–36.5)
MCV: 73.6 FL — ABNORMAL LOW (ref 80.0–99.0)
PLATELET: 527 10*3/uL — ABNORMAL HIGH (ref 150–400)
RBC: 4.32 M/uL (ref 3.80–5.20)
RDW: 16.8 % — ABNORMAL HIGH (ref 11.5–14.5)
WBC: 8.9 10*3/uL (ref 3.6–11.0)

## 2016-03-21 LAB — HCG QL SERUM: HCG, Ql.: NEGATIVE

## 2016-03-21 MED ORDER — TOLTERODINE SR 2 MG 24 HR CAP
2 mg | ORAL_CAPSULE | Freq: Every day | ORAL | 3 refills | Status: DC
Start: 2016-03-21 — End: 2016-04-01

## 2016-03-21 NOTE — Other (Signed)
[place]  PREOPERATIVE INSTRUCTIONS    Surgery Date:   Wednesday 03/23/16   Surgery arrival time given by surgeon: No  (If ???no???,[facility] staff will call you between 3pm - 7pm the day before surgery with your arrival time. If your surgery is on a Monday, we will call you the preceding Friday.  Please call [phone] after 7pm if you did not receive your arrival time.) Phone call verification on Tuesday 03/22/16.  1. Report  to the [address] [place] on the day of your surgery.  Bring your insurance card, photo identification, and any copayment (if applicable).  2. You must have a responsible adult to drive you home and stay with you the first 24 hours after surgery if you are going home the same day of your surgery.   3. Nothing to eat or drink after midnight the night before surgery. This means NO water, gum, mints, coffee, juice, etc.    4. MEDICATIONS TO TAKE THE MORNING OF SURGERY WITH A SIP OF WATER:NONE  5. No alcoholic beverages 24 hours before and after your surgery.  6. If you are being admitted to the hospital,please leave personal belongings/luggage in your car until you have an assigned hospital room number.( The hospital discharge time is 12 PM NOON. Your adult driver should be at the hospital prior to the noon discharge time unless otherwise instructed.)   7. STOP Aspirin and/or any non-steroidal anti-inflammatory drugs (i.e. Ibuprofen, Naproxen, Advil, Aleve) as directed by your surgeon.  You may take Tylenol.  Stop herbal supplements 1 week prior to  surgery.  8. If you are currently taking Plavix, Coumadin,or any other blood-thinning/ anticoagulant medication contact your surgeon for instructions.  9. Wear comfortable clothes. Wear your glasses instead of contacts. Please leave all money, jewelry and valuables at home. No make up, particularly mascara, the day of surgery.   10.  REMOVE ALL body piercings, rings,and jewelry and leave at home.  Wear  your hair loose or down, no pony-tails, buns, or any metal hair clips.   11. If you shower the morning of surgery, please do not apply any lotions, powders, or deodorants afterwards.   Do not shave any body area within 24 hours of your surgery.  12. Please follow all instructions to avoid any potential surgical cancellation.  13.  Should your physical condition change, (i.e. fever, cold, flu, etc.) please notify your surgeon as soon as possible.  14. It is important to be on time. If a situation occurs where you may be delayed, please call:  [phone] / [phone] on the day of surgery.  15. The Preadmission Testing staff can be reached at [phone].  16. Special instructions: Free valet parking 7a-5p. Bring completed medication list on day of surgery.  17. The patient was contacted  in person. She  verbalize  understanding of all instructions does not  need reinforcement.

## 2016-03-21 NOTE — Progress Notes (Signed)
[facility]  OFFICE PROCEDURE PROGRESS NOTE  ??  ??  ??  Chart reviewed for the following:  I, Rickard Rhymes, have reviewed the History, Physical and updated the Allergic reactions for Alyssa [name]  ??  TIME OUT performed immediately prior to start of procedure:  I, Rickard Rhymes, have performed the following reviews on Alyssa [name] prior to the start of the procedure:  ????  * Patient was identified by name and date of birth   * Agreement on procedure being performed was verified  * Risks and Benefits explained to the patient  * Procedure site verified and marked as necessary  * Patient was positioned for comfort  * Consent was signed and verified  ????  Time: 2:20pm  ??  ??  Date of procedure: 03/21/2016  ??  Procedure performed by:  Earna Coder, MD  ??  Provider assisted by: Rickard Rhymes MA  ??  Patient assisted by: self  ??  How tolerated by patient: tolerated the procedure well with no complications  ??  Post Procedural Pain Scale: 0 - No Hurt  ??  Comments: none    Procedure note: Urodynamics      Indications:  Alyssa [name] is a G3 P37,  35 y.o. female [facility] Patient's last menstrual period was 02/11/2016.  She complains of moderate and life-altering symptoms of stress incontinence and prolapse for years. She is here today for urodynamic testing.  Procedure: Urodynamic testing was performed with the Lumax machine.   Pressure flow testing via Lumax device was performed. Voided volume was 72 cc.   Maximum flow rate achieved was 10.2 cc/sec. Average flow rate was 0.8 cc/sec.   Post void residual was 0 cc.   She felt the first filling sensation at 103 cc, had the urge to urinate at 151 cc, strong urge at 200 cc, and very uncomfortable at 278 cc.   Bladder filling showed normal pressures   The total bladder volume achieved was 352 cc.   Leak point pressure was not achieved. The leak volume was 0 cc.   Q-tip deflection was 0 degrees. U-V angle very well supported   She was not noted to lose urine when she coughed in the sitting position. There was no urine loss with manual support of one side of the urethra.   EMG was not completed.   Urethral sphincter length is 2.5 cm.   Maximum urethral closure pressure was 60 cm of water.     Impression:  No sign of USI.  Anatomy not consistent with USI.  URO shows stable urethra with normal sphincter and no sign of need for TVT.  Plan:  Remove TVT from surgical plan.  Will try empiric Detrol with OAB instructions.  Continue with other surgery as planned.      Care Provider: Alvester Morin, M.D.

## 2016-03-22 LAB — EKG 12-LEAD
Atrial Rate: 83 {beats}/min
Diagnosis: NORMAL
P Axis: 35 degrees
P-R Interval: 140 ms
Q-T Interval: 378 ms
QRS Duration: 80 ms
QTc Calculation (Bazett): 444 ms
R Axis: 78 degrees
T Axis: 31 degrees
Ventricular Rate: 83 {beats}/min

## 2016-03-22 LAB — EKG, 12 LEAD, INITIAL
Atrial Rate: 83 {beats}/min
Calculated P Axis: 35 degrees
Calculated R Axis: 78 degrees
Calculated T Axis: 31 degrees
Diagnosis: NORMAL
P-R Interval: 140 ms
Q-T Interval: 378 ms
QRS Duration: 80 ms
QTC Calculation (Bezet): 444 ms
Ventricular Rate: 83 {beats}/min

## 2016-03-23 ENCOUNTER — Encounter
Admit: 2016-03-29 | Discharge: 2016-03-29 | Payer: PRIVATE HEALTH INSURANCE | Attending: Obstetrics & Gynecology | Primary: Family Medicine

## 2016-03-23 ENCOUNTER — Encounter: Attending: Obstetrics & Gynecology | Primary: Family Medicine

## 2016-03-23 ENCOUNTER — Inpatient Hospital Stay: Payer: MEDICAID

## 2016-03-23 LAB — GLUCOSE, POC
Glucose (POC): 212 mg/dL — ABNORMAL HIGH (ref 65–100)
Glucose (POC): 259 mg/dL — ABNORMAL HIGH (ref 65–100)
Glucose (POC): 216 mg/dL — ABNORMAL HIGH (ref 65–100)

## 2016-03-23 LAB — HCG URINE, QL. - POC: Pregnancy test,urine (POC): NEGATIVE

## 2016-03-23 MED ORDER — ROCURONIUM 10 MG/ML IV
10 mg/mL | INTRAVENOUS | Status: AC
Start: 2016-03-23 — End: ?

## 2016-03-23 MED ORDER — KETOROLAC TROMETHAMINE 30 MG/ML INJECTION
30 mg/mL (1 mL) | INTRAMUSCULAR | Status: DC | PRN
Start: 2016-03-23 — End: 2016-03-23
  Administered 2016-03-23: 17:00:00 via INTRAVENOUS

## 2016-03-23 MED ORDER — LISINOPRIL 5 MG TAB
5 mg | Freq: Every day | ORAL | Status: DC
Start: 2016-03-23 — End: 2016-03-24
  Administered 2016-03-24 (×2): via ORAL

## 2016-03-23 MED ORDER — BISACODYL 5 MG TAB, DELAYED RELEASE
5 mg | Freq: Every day | ORAL | Status: DC | PRN
Start: 2016-03-23 — End: 2016-03-24

## 2016-03-23 MED ORDER — .PHARMACY TO SUBSTITUTE PER PROTOCOL
Status: DC | PRN
Start: 2016-03-23 — End: 2016-03-23

## 2016-03-23 MED ORDER — SITAGLIPTIN 25 MG TAB
25 mg | Freq: Two times a day (BID) | ORAL | Status: DC
Start: 2016-03-23 — End: 2016-03-24
  Administered 2016-03-23 – 2016-03-24 (×3): via ORAL

## 2016-03-23 MED ORDER — LACTATED RINGERS IV
INTRAVENOUS | Status: DC | PRN
Start: 2016-03-23 — End: 2016-03-23
  Administered 2016-03-23: 14:00:00 via INTRAVENOUS

## 2016-03-23 MED ORDER — LIDOCAINE (PF) 10 MG/ML (1 %) IJ SOLN
10 mg/mL (1 %) | INTRAMUSCULAR | Status: DC | PRN
Start: 2016-03-23 — End: 2016-03-23

## 2016-03-23 MED ORDER — SODIUM CHLORIDE 0.9 % IJ SYRG
Freq: Three times a day (TID) | INTRAMUSCULAR | Status: DC
Start: 2016-03-23 — End: 2016-03-23
  Administered 2016-03-23: 12:00:00 via INTRAVENOUS

## 2016-03-23 MED ORDER — SODIUM CHLORIDE 0.9 % IJ SYRG
INTRAMUSCULAR | Status: DC | PRN
Start: 2016-03-23 — End: 2016-03-23

## 2016-03-23 MED ORDER — GLYCOPYRROLATE 0.2 MG/ML IJ SOLN
0.2 mg/mL | INTRAMUSCULAR | Status: AC
Start: 2016-03-23 — End: ?

## 2016-03-23 MED ORDER — PROPOFOL 10 MG/ML IV EMUL
10 mg/mL | INTRAVENOUS | Status: DC | PRN
Start: 2016-03-23 — End: 2016-03-23
  Administered 2016-03-23: 14:00:00 via INTRAVENOUS

## 2016-03-23 MED ORDER — MIDAZOLAM 1 MG/ML IJ SOLN
1 mg/mL | INTRAMUSCULAR | Status: DC | PRN
Start: 2016-03-23 — End: 2016-03-23
  Administered 2016-03-23: 13:00:00 via INTRAVENOUS

## 2016-03-23 MED ORDER — NALOXONE 0.4 MG/ML INJECTION
0.4 mg/mL | INTRAMUSCULAR | Status: DC | PRN
Start: 2016-03-23 — End: 2016-03-24

## 2016-03-23 MED ORDER — GLYCOPYRROLATE 0.2 MG/ML IJ SOLN
0.2 mg/mL | INTRAMUSCULAR | Status: DC | PRN
Start: 2016-03-23 — End: 2016-03-23
  Administered 2016-03-23: 17:00:00 via INTRAVENOUS

## 2016-03-23 MED ORDER — ONDANSETRON (PF) 4 MG/2 ML INJECTION
4 mg/2 mL | INTRAMUSCULAR | Status: DC | PRN
Start: 2016-03-23 — End: 2016-03-23

## 2016-03-23 MED ORDER — HYDROMORPHONE (PF) 2 MG/ML IJ SOLN
2 mg/mL | INTRAMUSCULAR | Status: DC | PRN
Start: 2016-03-23 — End: 2016-03-23
  Administered 2016-03-23 (×3): via INTRAVENOUS

## 2016-03-23 MED ORDER — FAMOTIDINE (PF) 20 MG/2 ML IV
20 mg/2 mL | INTRAVENOUS | Status: DC | PRN
Start: 2016-03-23 — End: 2016-03-23
  Administered 2016-03-23: 14:00:00 via INTRAVENOUS

## 2016-03-23 MED ORDER — LACTATED RINGERS IV
INTRAVENOUS | Status: AC
Start: 2016-03-23 — End: 2016-03-24
  Administered 2016-03-23 – 2016-03-24 (×3): via INTRAVENOUS

## 2016-03-23 MED ORDER — EMPAGLIFLOZIN 25 MG TABLET
25 mg | Freq: Every day | ORAL | Status: DC
Start: 2016-03-23 — End: 2016-03-24

## 2016-03-23 MED ORDER — LACTATED RINGERS IV
INTRAVENOUS | Status: DC
Start: 2016-03-23 — End: 2016-03-23
  Administered 2016-03-23 (×2): via INTRAVENOUS

## 2016-03-23 MED ORDER — LACTATED RINGERS IV
INTRAVENOUS | Status: DC
Start: 2016-03-23 — End: 2016-03-23
  Administered 2016-03-23: 18:00:00 via INTRAVENOUS

## 2016-03-23 MED ORDER — LIDOCAINE (PF) 20 MG/ML (2 %) IV SYRINGE
100 mg/5 mL (2 %) | INTRAVENOUS | Status: AC
Start: 2016-03-23 — End: ?

## 2016-03-23 MED ORDER — SEVOFLURANE FOR INHALATION
RESPIRATORY_TRACT | Status: AC
Start: 2016-03-23 — End: ?

## 2016-03-23 MED ORDER — SODIUM CHLORIDE 0.9 % IJ SYRG
INTRAMUSCULAR | Status: DC | PRN
Start: 2016-03-23 — End: 2016-03-24

## 2016-03-23 MED ORDER — HYDROMORPHONE 2 MG/ML INJECTION SOLUTION
2 mg/mL | INTRAMUSCULAR | Status: AC
Start: 2016-03-23 — End: ?

## 2016-03-23 MED ORDER — KETOROLAC TROMETHAMINE 30 MG/ML INJECTION
30 mg/mL (1 mL) | Freq: Four times a day (QID) | INTRAMUSCULAR | Status: DC | PRN
Start: 2016-03-23 — End: 2016-03-24
  Administered 2016-03-23 – 2016-03-24 (×3): via INTRAVENOUS

## 2016-03-23 MED ORDER — METFORMIN 500 MG TAB
500 mg | Freq: Two times a day (BID) | ORAL | Status: DC
Start: 2016-03-23 — End: 2016-03-24
  Administered 2016-03-23 – 2016-03-24 (×3): via ORAL

## 2016-03-23 MED ORDER — KETOROLAC TROMETHAMINE 30 MG/ML INJECTION
30 mg/mL (1 mL) | INTRAMUSCULAR | Status: AC
Start: 2016-03-23 — End: ?

## 2016-03-23 MED ORDER — GLIPIZIDE 5 MG TAB
5 mg | Freq: Two times a day (BID) | ORAL | Status: DC
Start: 2016-03-23 — End: 2016-03-24
  Administered 2016-03-23 – 2016-03-24 (×2): via ORAL

## 2016-03-23 MED ORDER — DIPHENHYDRAMINE HCL 50 MG/ML IJ SOLN
50 mg/mL | INTRAMUSCULAR | Status: DC | PRN
Start: 2016-03-23 — End: 2016-03-23

## 2016-03-23 MED ORDER — HYDROCODONE-ACETAMINOPHEN 10 MG-325 MG TAB
10-325 mg | ORAL | Status: DC | PRN
Start: 2016-03-23 — End: 2016-03-24
  Administered 2016-03-23 – 2016-03-24 (×4): via ORAL

## 2016-03-23 MED ORDER — HYDROMORPHONE (PF) 1 MG/ML IJ SOLN
1 mg/mL | INTRAMUSCULAR | Status: DC | PRN
Start: 2016-03-23 — End: 2016-03-24

## 2016-03-23 MED ORDER — LIDOCAINE (PF) 20 MG/ML (2 %) IJ SOLN
20 mg/mL (2 %) | INTRAMUSCULAR | Status: DC | PRN
Start: 2016-03-23 — End: 2016-03-23
  Administered 2016-03-23: 14:00:00 via INTRAVENOUS

## 2016-03-23 MED ORDER — MIDAZOLAM 1 MG/ML IJ SOLN
1 mg/mL | INTRAMUSCULAR | Status: AC
Start: 2016-03-23 — End: ?

## 2016-03-23 MED ORDER — NEOSTIGMINE METHYLSULFATE 1 MG/ML INJECTION
1 mg/mL | INTRAMUSCULAR | Status: DC | PRN
Start: 2016-03-23 — End: 2016-03-23
  Administered 2016-03-23: 17:00:00 via INTRAVENOUS

## 2016-03-23 MED ORDER — SUCCINYLCHOLINE CHLORIDE 20 MG/ML INJECTION
20 mg/mL | INTRAMUSCULAR | Status: DC | PRN
Start: 2016-03-23 — End: 2016-03-23
  Administered 2016-03-23: 14:00:00 via INTRAVENOUS

## 2016-03-23 MED ORDER — NEOSTIGMINE METHYLSULFATE 3 MG/3 ML (1 MG/ML) IV SYRINGE
3 mg/ mL (1 mg/mL) | INTRAVENOUS | Status: AC
Start: 2016-03-23 — End: ?

## 2016-03-23 MED ORDER — ONDANSETRON 4 MG TAB, RAPID DISSOLVE
4 mg | ORAL | Status: DC | PRN
Start: 2016-03-23 — End: 2016-03-24
  Administered 2016-03-23: 20:00:00 via ORAL

## 2016-03-23 MED ORDER — HYDROMORPHONE (PF) 1 MG/ML IJ SOLN
1 mg/mL | INTRAMUSCULAR | Status: DC | PRN
Start: 2016-03-23 — End: 2016-03-23

## 2016-03-23 MED ORDER — SUCCINYLCHOLINE CHLORIDE 100 MG/5 ML (20 MG/ML) IV SYRINGE
100 mg/5 mL (20 mg/mL) | INTRAVENOUS | Status: AC
Start: 2016-03-23 — End: ?

## 2016-03-23 MED ORDER — ONDANSETRON (PF) 4 MG/2 ML INJECTION
4 mg/2 mL | INTRAMUSCULAR | Status: DC | PRN
Start: 2016-03-23 — End: 2016-03-23
  Administered 2016-03-23: 17:00:00 via INTRAVENOUS

## 2016-03-23 MED ORDER — WHITE PETROLATUM-MINERAL OIL 83 %-15 % EYE OINTMENT
83-15 % | OPHTHALMIC | Status: AC
Start: 2016-03-23 — End: ?

## 2016-03-23 MED ORDER — DIPHENHYDRAMINE HCL 50 MG/ML IJ SOLN
50 mg/mL | INTRAMUSCULAR | Status: DC | PRN
Start: 2016-03-23 — End: 2016-03-24
  Administered 2016-03-23: 20:00:00 via INTRAVENOUS

## 2016-03-23 MED ORDER — CEFAZOLIN 2 GRAM/50 ML NS IVPB
INTRAVENOUS | Status: AC
Start: 2016-03-23 — End: 2016-03-23
  Administered 2016-03-23: 14:00:00 via INTRAVENOUS

## 2016-03-23 MED ORDER — ROCURONIUM 10 MG/ML IV
10 mg/mL | INTRAVENOUS | Status: DC | PRN
Start: 2016-03-23 — End: 2016-03-23
  Administered 2016-03-23 (×6): via INTRAVENOUS

## 2016-03-23 MED ORDER — ONDANSETRON (PF) 4 MG/2 ML INJECTION
4 mg/2 mL | INTRAMUSCULAR | Status: AC
Start: 2016-03-23 — End: ?

## 2016-03-23 MED ORDER — FENTANYL CITRATE (PF) 50 MCG/ML IJ SOLN
50 mcg/mL | INTRAMUSCULAR | Status: DC | PRN
Start: 2016-03-23 — End: 2016-03-23
  Administered 2016-03-23 (×4): via INTRAVENOUS

## 2016-03-23 MED ORDER — SODIUM CHLORIDE 0.9 % IJ SYRG
Freq: Three times a day (TID) | INTRAMUSCULAR | Status: DC
Start: 2016-03-23 — End: 2016-03-24
  Administered 2016-03-23 – 2016-03-24 (×3): via INTRAVENOUS

## 2016-03-23 MED ORDER — PROPOFOL 10 MG/ML IV EMUL
10 mg/mL | INTRAVENOUS | Status: AC
Start: 2016-03-23 — End: ?

## 2016-03-23 MED ORDER — FENTANYL CITRATE (PF) 50 MCG/ML IJ SOLN
50 mcg/mL | INTRAMUSCULAR | Status: AC
Start: 2016-03-23 — End: ?

## 2016-03-23 MED FILL — LACTATED RINGERS IV: INTRAVENOUS | Qty: 1000

## 2016-03-23 MED FILL — BD POSIFLUSH NORMAL SALINE 0.9 % INJECTION SYRINGE: INTRAMUSCULAR | Qty: 10

## 2016-03-23 MED FILL — ROCURONIUM 10 MG/ML IV: 10 mg/mL | INTRAVENOUS | Qty: 5

## 2016-03-23 MED FILL — ULTANE INHALATION LIQUID: RESPIRATORY_TRACT | Qty: 250

## 2016-03-23 MED FILL — GLYCOPYRROLATE 0.2 MG/ML IJ SOLN: 0.2 mg/mL | INTRAMUSCULAR | Qty: 1

## 2016-03-23 MED FILL — GLIPIZIDE 5 MG TAB: 5 mg | ORAL | Qty: 2

## 2016-03-23 MED FILL — KETOROLAC TROMETHAMINE 30 MG/ML INJECTION: 30 mg/mL (1 mL) | INTRAMUSCULAR | Qty: 1

## 2016-03-23 MED FILL — ONDANSETRON 4 MG TAB, RAPID DISSOLVE: 4 mg | ORAL | Qty: 2

## 2016-03-23 MED FILL — FENTANYL CITRATE (PF) 50 MCG/ML IJ SOLN: 50 mcg/mL | INTRAMUSCULAR | Qty: 5

## 2016-03-23 MED FILL — MIDAZOLAM 1 MG/ML IJ SOLN: 1 mg/mL | INTRAMUSCULAR | Qty: 5

## 2016-03-23 MED FILL — SUCCINYLCHOLINE CHLORIDE 100 MG/5 ML (20 MG/ML) IV SYRINGE: 100 mg/5 mL (20 mg/mL) | INTRAVENOUS | Qty: 10

## 2016-03-23 MED FILL — METFORMIN 500 MG TAB: 500 mg | ORAL | Qty: 2

## 2016-03-23 MED FILL — JANUVIA 25 MG TABLET: 25 mg | ORAL | Qty: 2

## 2016-03-23 MED FILL — ONDANSETRON (PF) 4 MG/2 ML INJECTION: 4 mg/2 mL | INTRAMUSCULAR | Qty: 2

## 2016-03-23 MED FILL — NEOSTIGMINE METHYLSULFATE 3 MG/3 ML (1 MG/ML) IV SYRINGE: 3 mg/ mL (1 mg/mL) | INTRAVENOUS | Qty: 3

## 2016-03-23 MED FILL — LIDOCAINE (PF) 20 MG/ML (2 %) IV SYRINGE: 100 mg/5 mL (2 %) | INTRAVENOUS | Qty: 5

## 2016-03-23 MED FILL — HYDROMORPHONE 2 MG/ML INJECTION SOLUTION: 2 mg/mL | INTRAMUSCULAR | Qty: 1

## 2016-03-23 MED FILL — DIPHENHYDRAMINE HCL 50 MG/ML IJ SOLN: 50 mg/mL | INTRAMUSCULAR | Qty: 1

## 2016-03-23 MED FILL — CEFAZOLIN 2 GRAM/50 ML NS IVPB: INTRAVENOUS | Qty: 50

## 2016-03-23 MED FILL — HYDROCODONE-ACETAMINOPHEN 10 MG-325 MG TAB: 10-325 mg | ORAL | Qty: 1

## 2016-03-23 MED FILL — ARTIFICIAL TEARS (PETROLATUM/MINERAL OIL) 83 %-15 % EYE OINTMENT: 83-15 % | OPHTHALMIC | Qty: 3.5

## 2016-03-23 MED FILL — DIPRIVAN 10 MG/ML INTRAVENOUS EMULSION: 10 mg/mL | INTRAVENOUS | Qty: 40

## 2016-03-23 NOTE — Other (Addendum)
Dr.Britton informed of post op accu [id number]
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
## 2016-03-23 NOTE — Progress Notes (Signed)
Primary Nurse Lynelle Smoke and Doristine Devoid, RN performed a dual skin assessment on this patient. No impairment noted beyond surgical incisions.  Braden score is 21

## 2016-03-23 NOTE — Other (Signed)
TRANSFER - OUT REPORT:    Verbal report given to RN [facility] [name]  being transferred to 434(for routine post - op       Report consisted of patient???s Situation, Background, Assessment and   Recommendations(SBAR).     Information from the following report(s) SBAR, OR Summary, Procedure Summary, Intake/Output and MAR was reviewed with the receiving nurse.    Lines:   Peripheral IV 03/23/16 Right;Posterior Wrist (Active)   Site Assessment Clean, dry, & intact 03/23/2016 12:35 PM   Phlebitis Assessment 0 03/23/2016 12:35 PM   Infiltration Assessment 0 03/23/2016 12:35 PM   Dressing Status Clean, dry, & intact;Occlusive 03/23/2016 12:35 PM   Dressing Type Tape;Transparent 03/23/2016 12:35 PM   Hub Color/Line Status Blue;Capped 03/23/2016 12:35 PM       Peripheral IV 03/23/16 Left Wrist (Active)   Site Assessment Clean, dry, & intact 03/23/2016 12:35 PM   Phlebitis Assessment 0 03/23/2016 12:35 PM   Infiltration Assessment 0 03/23/2016 12:35 PM   Dressing Status Clean, dry, & intact;Occlusive 03/23/2016 12:35 PM   Dressing Type Tape;Transparent 03/23/2016 12:35 PM   Hub Color/Line Status Pink;Infusing 03/23/2016 12:35 PM        Opportunity for questions and clarification was provided.      Patient transported with:   O2 @ 2 liters  Registered Nurse

## 2016-03-23 NOTE — Other (Signed)
FAMILY INFORMED OF POST OP STATUS IN PACU AND FORWARDING BED PLACEMENT.

## 2016-03-23 NOTE — Op Note (Signed)
OPERATIVE REPORT LSH/RSO/lysis of adhesions    Name: Alyssa [name]   Medical Record Number: [id number]   Date of Birth: 04-14-1981  Date of Surgery: 03/23/2016    Preoperative Diagnosis: MENORRHAGIA, FIBROIDS, PELVIC PAIN    Postoperative Diagnosis: MENORRHAGIA, FIBROIDS, PELVIC PAIN, SEVERE ADHESIONS    Surgeon:  Earna Coder, MD     Assistant:  staff    Anesthesia: General    Procedure: Procedure(s):  LAPAROSCOPIC SUPRACERVICAL HYSTERECTOMY     Findings: See below    Estimated Blood Loss:  less than 100 cc    Drains: none    Pathology /Specimens:    ID Type Source Tests Collected by Time Destination   1 : Uterus, tubes, right ovary Fresh Uterus  Earna Coder, MD 03/23/2016 1111 Pathology       DVT Prophylaxis: SCD Hose    Antibiotic Prophylaxis: Ancef:    INDICATIONS:    Informed consent was obtained for the above procedure, and the patient stated that she had no further questions.    PROCEDURE:  The patient was prepped and draped in the dorsal lithotomy position after institution of general anesthesia.      A Veress needle was placed through an incision in the right upper quadrant near the midline.  Pneumoperitoneum was obtained without difficulty. The 5 mm balloon port was placed there without difficulty. Another 5 mm port was placed in the left upper quadrant.     The pelvis was not visible, covered by a dense mat of omentum adhering to her old midline incision which started at the symphysis and continued to above the umbilicus.  The harmonic scalpel was used to take down those adhesions.  Then the umbilical 12 mm port was placed, then a 5 mm port suprapubically in the midline with direct visualization and without difficulty.     Then there was small bowel adhering above the pelvis and draped from side to side.  Under that was large bowel adherent to the right ovary, which was cystically enlarged to about 5 cm in greatest dimension. The tube was  draped over the over, had a metal clip applied to it, and was adherent to bowel also.  All of the bowel adhesions were taken down with laparoscopic scissor blunt and sharp dissection. There was one one cm serosal defect of the small bowel which was oversewn with one d-stitch of 3-0 chromic.    The pelvis was finally visualized.  The uterus was diffusely enlarged. The adnexa on the left was still not visible secondary to adhesions between the enlarged uterus and lateral pelvis and cul-de-sac. The cul-de-sac was completely blocked by adhesion.  Upper abdominal findings were unremarkable.    The Harmonic scalpel was used to take down the adhesions of the ovary and tube on the right.  The right mesosalpinx was divided and the tube removed to later join the rest of the specimen.  The right round ligament and the right broad ligament were finally visualized and taken down.  The incision was taken down anteriorly to the area of the bladder flap.  The process on the left was similar with dissection of adhesion to visualize the normal structures.  The cul-de-sac was dissected with assistance of a sponge stick elevating the uterus and placing the adhesions to the colon on stretch.    The vessels were reached on both sides.  Cauterization was accomplished and then  the uterus blanched nicely.  The uterus was about 10 week size with multiple myomas.  The  cervix was identified by visualizing the insertion of the utero-sacral ligaments and incising a line just above them on the posterior cervix.  Then starting on the right side and continuing across the junction, the uterus was divided from the cervix.  The uterus was freed up without sign of injury of any other structures and then the endocervical canal was cauterized.  After the uterus was out of the pelvis the left ovary and tube were visualized.  The tube was post ligation with a metal clip and the tube was adherent to the otherwise  normal ovary.  The tube was removed with the harmonic along with the metal clip and placed with the uterus later.  The right ovary was removed because the left was normal and the ovary did not decompress with attempted drainage of the cyst.  There was no sign of injury to any pelvic structure.    The 12 mm port at the umbilicus was used to introduce the nylon endobag into the abdomen. The uterus with the right ovary was placed in the bag.  The opening of the bag was brought up through the incision at the umbilicus.    The 12 mm port was introduced and the bag was inflated.  The scope was placed into the 12 mm port and used to visualize the harmonic scalpel in the right upper quadrant 5 mm balloon port and make a puncture at the opening for the 5 mm trocar to enter the bag.   It was introduced into the bag and the balloon inflated. The scope was moved to that port to visualize the bag contents    The 12 port was removed, the morcellator introduced, and the uterus was morcellated in the bag and removed through the umbilicus.  All tissue fragments were identified and removed and there was no spillage of contents into the abdominal cavity at any point.  The suction unit was used to irrigate the internal surfaces of the bag and remove blood and clots.  The bag was then removed intact with some blood and small tissue fragments after deflating the 5 mm port balloon.  The uterus, right ovary, and tubes were sent to pathology as the specimen.    The abdomen was inspected and no trauma to any structures was identified.  The pelvis was hemostatic. The interceed barrier was placed over the cervical stump.  The procedure was then terminated.  The CO2 removed and then the instruments and trocars were removed from the abdomen.  The incisions were closed with 0-Vicryl in the fascia in the umbilical incision and 4-0 Monocryl subcuticularly in all 3 incisions.     The patient was awakened and taken to the recovery room in good condition.        Signed By:  Earna Coder, MD     March 23, 2016

## 2016-03-23 NOTE — Anesthesia Post-Procedure Evaluation (Signed)
Post-Anesthesia Evaluation and Assessment    Patient: Alyssa [name] MRN: [id number]  SSN: [id number]    Date of Birth: 11-Nov-1980  Age: 35 y.o.  Sex: female       Cardiovascular Function/Vital Signs  Visit Vitals   ??? BP 135/66   ??? Pulse 79   ??? Temp 36.9 ??C (98.4 ??F)   ??? Resp 15   ??? SpO2 100%       Patient is status post general anesthesia for Procedure(s):  LAPAROSCOPIC SUPRACERVICAL HYSTERECTOMY.    Nausea/Vomiting: None    Postoperative hydration reviewed and adequate.    Pain:  Pain Scale 1: Numeric (0 - 10) (03/23/16 1319)  Pain Intensity 1: 0 (03/23/16 1319)   Managed    Neurological Status:   Neuro (WDL): Exceptions to WDL (03/23/16 1319)  Neuro  Neurologic State: Drowsy;Eyes open spontaneously (03/23/16 1319)  Orientation Level: Oriented to person;Oriented to situation (03/23/16 1319)  Cognition: Follows commands (03/23/16 1319)  Speech: Clear;Delayed responses (03/23/16 1319)  LUE Motor Response: Purposeful (03/23/16 1319)  LLE Motor Response: Purposeful (03/23/16 1319)  RUE Motor Response: Purposeful (03/23/16 1319)  RLE Motor Response: Purposeful (03/23/16 1319)   At baseline    Mental Status and Level of Consciousness: Arousable    Pulmonary Status:   O2 Device: Nasal cannula (03/23/16 1319)   Adequate oxygenation and airway patent    Complications related to anesthesia: None    Post-anesthesia assessment completed. No concerns    Signed By: Danella Sensing, MD     March 23, 2016

## 2016-03-23 NOTE — Other (Signed)
DR.. A,Britton informed.

## 2016-03-23 NOTE — Progress Notes (Signed)
Bedside shift change report given to Brooke Bonito (oncoming nurse) by Ree Shay (offgoing nurse). Report included the following information SBAR, Kardex, Intake/Output and MAR.

## 2016-03-23 NOTE — Anesthesia Pre-Procedure Evaluation (Signed)
Anesthetic History   No history of anesthetic complications            Review of Systems / Medical History  Patient summary reviewed, nursing notes reviewed and pertinent labs reviewed    Pulmonary            Asthma        Neuro/Psych              Cardiovascular    Hypertension              Exercise tolerance: >4 METS     GI/Hepatic/Renal     GERD: well controlled           Endo/Other    Diabetes: poorly controlled, type 2    Morbid obesity     Other Findings              Physical Exam    Airway  Mallampati: II    Neck ROM: normal range of motion   Mouth opening: Normal     Cardiovascular    Rhythm: regular  Rate: normal         Dental  No notable dental hx       Pulmonary  Breath sounds clear to auscultation               Abdominal         Other Findings            Anesthetic Plan    ASA: 3  Anesthesia type: general          Induction: Intravenous  Anesthetic plan and risks discussed with: Patient      Informed consent obtained.

## 2016-03-23 NOTE — Other (Signed)
Pt fall protocol  Yellow arm band on patient, yellow non skid socks on   bed in low position, all side rails up, call bell in reach  Pt  & family instructed in "call don't fall" protocol     Use your call bell,and wait for assistance, staff not family will assist you to get up &   move about  Pt & family verbalize understanding of fall precautions and "call don't fall" protocol

## 2016-03-23 NOTE — Op Note (Signed)
OPERATIVE REPORT LSH/RSO/lysis of adhesions    Name: Alyssa [name]   Medical Record Number: [id number]   Date of Birth: [birth year]  Date of Surgery: 03/23/2016    Preoperative Diagnosis: MENORRHAGIA, FIBROIDS, PELVIC PAIN    Postoperative Diagnosis: MENORRHAGIA, FIBROIDS, PELVIC PAIN, SEVERE ADHESIONS    Surgeon:  Earna Coder, MD     Assistant:  staff    Anesthesia: General    Procedure: Procedure(s):  LAPAROSCOPIC SUPRACERVICAL HYSTERECTOMY     Findings: See below    Estimated Blood Loss:  less than 100 cc    Drains: none    Pathology /Specimens:    ID Type Source Tests Collected by Time Destination   1 : Uterus, tubes, right ovary Fresh Uterus  Earna Coder, MD 03/23/2016 1111 Pathology       DVT Prophylaxis: SCD Hose    Antibiotic Prophylaxis: Ancef:    INDICATIONS:    Informed consent was obtained for the above procedure, and the patient stated that she had no further questions.    PROCEDURE:  The patient was prepped and draped in the dorsal lithotomy position after institution of general anesthesia.      A Veress needle was placed through an incision in the right upper quadrant near the midline.  Pneumoperitoneum was obtained without difficulty. The 5 mm balloon port was placed there without difficulty. Another 5 mm port was placed in the left upper quadrant.     The pelvis was not visible, covered by a dense mat of omentum adhering to her old midline incision which started at the symphysis and continued to above the umbilicus.  The harmonic scalpel was used to take down those adhesions.  Then the umbilical 12 mm port was placed, then a 5 mm port suprapubically in the midline with direct visualization and without difficulty.     Then there was small bowel adhering above the pelvis and draped from side to side.  Under that was large bowel adherent to the right ovary, which was cystically enlarged to about 5 cm in greatest dimension. The tube was draped over the over, had a metal clip applied to it, and was  adherent to bowel also.  All of the bowel adhesions were taken down with laparoscopic scissor blunt and sharp dissection. There was one one cm serosal defect of the small bowel which was oversewn with one d-stitch of 3-0 chromic.    The pelvis was finally visualized.  The uterus was diffusely enlarged. The adnexa on the left was still not visible secondary to adhesions between the enlarged uterus and lateral pelvis and cul-de-sac. The cul-de-sac was completely blocked by adhesion.  Upper abdominal findings were unremarkable.    The Harmonic scalpel was used to take down the adhesions of the ovary and tube on the right.  The right mesosalpinx was divided and the tube removed to later join the rest of the specimen.  The right round ligament and the right broad ligament were finally visualized and taken down.  The incision was taken down anteriorly to the area of the bladder flap.  The process on the left was similar with dissection of adhesion to visualize the normal structures.  The cul-de-sac was dissected with assistance of a sponge stick elevating the uterus and placing the adhesions to the colon on stretch.    The vessels were reached on both sides.  Cauterization was accomplished and then  the uterus blanched nicely.  The uterus was about 10 week size with multiple myomas.  The  cervix was identified by visualizing the insertion of the utero-sacral ligaments and incising a line just above them on the posterior cervix.  Then starting on the right side and continuing across the junction, the uterus was divided from the cervix.  The uterus was freed up without sign of injury of any other structures and then the endocervical canal was cauterized.  After the uterus was out of the pelvis the left ovary and tube were visualized.  The tube was post ligation with a metal clip and the tube was adherent to the otherwise normal ovary.  The tube was removed with the harmonic along with the metal clip and placed with the  uterus later.  The right ovary was removed because the left was normal and the ovary did not decompress with attempted drainage of the cyst.  There was no sign of injury to any pelvic structure.    The 12 mm port at the umbilicus was used to introduce the nylon endobag into the abdomen. The uterus with the right ovary was placed in the bag.  The opening of the bag was brought up through the incision at the umbilicus.    The 12 mm port was introduced and the bag was inflated.  The scope was placed into the 12 mm port and used to visualize the harmonic scalpel in the right upper quadrant 5 mm balloon port and make a puncture at the opening for the 5 mm trocar to enter the bag.   It was introduced into the bag and the balloon inflated. The scope was moved to that port to visualize the bag contents    The 12 port was removed, the morcellator introduced, and the uterus was morcellated in the bag and removed through the umbilicus.  All tissue fragments were identified and removed and there was no spillage of contents into the abdominal cavity at any point.  The suction unit was used to irrigate the internal surfaces of the bag and remove blood and clots.  The bag was then removed intact with some blood and small tissue fragments after deflating the 5 mm port balloon.  The uterus, right ovary, and tubes were sent to pathology as the specimen.    The abdomen was inspected and no trauma to any structures was identified.  The pelvis was hemostatic. The interceed barrier was placed over the cervical stump.  The procedure was then terminated.  The CO2 removed and then the instruments and trocars were removed from the abdomen.  The incisions were closed with 0-Vicryl in the fascia in the umbilical incision and 4-0 Monocryl subcuticularly in all 3 incisions.    The patient was awakened and taken to the recovery room in good condition.        Signed By:  Earna Coder, MD     March 23, 2016

## 2016-03-24 LAB — HGB & HCT
HCT: 27.6 % — ABNORMAL LOW (ref 35.0–47.0)
HGB: 8.5 g/dL — ABNORMAL LOW (ref 11.5–16.0)

## 2016-03-24 MED ORDER — OXYCODONE-ACETAMINOPHEN 10 MG-325 MG TAB
10-325 mg | ORAL_TABLET | ORAL | 0 refills | Status: DC | PRN
Start: 2016-03-24 — End: 2016-04-07

## 2016-03-24 MED ORDER — ONDANSETRON 8 MG TAB, RAPID DISSOLVE
8 mg | ORAL_TABLET | Freq: Three times a day (TID) | ORAL | 0 refills | Status: DC | PRN
Start: 2016-03-24 — End: 2016-04-01

## 2016-03-24 MED ORDER — HYDROMORPHONE 2 MG TAB
2 mg | ORAL_TABLET | ORAL | 0 refills | Status: DC | PRN
Start: 2016-03-24 — End: 2016-04-01

## 2016-03-24 MED FILL — KETOROLAC TROMETHAMINE 30 MG/ML INJECTION: 30 mg/mL (1 mL) | INTRAMUSCULAR | Qty: 30

## 2016-03-24 MED FILL — FAMOTIDINE (PF) 20 MG/2 ML IV: 20 mg/2 mL | INTRAVENOUS | Qty: 20

## 2016-03-24 MED FILL — ONDANSETRON (PF) 4 MG/2 ML INJECTION: 4 mg/2 mL | INTRAMUSCULAR | Qty: 4

## 2016-03-24 MED FILL — HYDROCODONE-ACETAMINOPHEN 10 MG-325 MG TAB: 10-325 mg | ORAL | Qty: 1

## 2016-03-24 MED FILL — KETOROLAC TROMETHAMINE 30 MG/ML INJECTION: 30 mg/mL (1 mL) | INTRAMUSCULAR | Qty: 1

## 2016-03-24 MED FILL — METFORMIN 500 MG TAB: 500 mg | ORAL | Qty: 2

## 2016-03-24 MED FILL — QUELICIN 20 MG/ML INJECTION SOLUTION: 20 mg/mL | INTRAMUSCULAR | Qty: 180

## 2016-03-24 MED FILL — LIDOCAINE (PF) 20 MG/ML (2 %) IJ SOLN: 20 mg/mL (2 %) | INTRAMUSCULAR | Qty: 60

## 2016-03-24 MED FILL — GLYCOPYRROLATE 0.2 MG/ML IJ SOLN: 0.2 mg/mL | INTRAMUSCULAR | Qty: 0.3

## 2016-03-24 MED FILL — JANUVIA 25 MG TABLET: 25 mg | ORAL | Qty: 2

## 2016-03-24 MED FILL — PROPOFOL 10 MG/ML IV EMUL: 10 mg/mL | INTRAVENOUS | Qty: 200

## 2016-03-24 MED FILL — NEOSTIGMINE METHYLSULFATE 1 MG/ML INJECTION: 1 mg/mL | INTRAMUSCULAR | Qty: 3

## 2016-03-24 MED FILL — ROCURONIUM 10 MG/ML IV: 10 mg/mL | INTRAVENOUS | Qty: 90

## 2016-03-24 MED FILL — GLIPIZIDE 5 MG TAB: 5 mg | ORAL | Qty: 2

## 2016-03-24 MED FILL — LISINOPRIL 5 MG TAB: 5 mg | ORAL | Qty: 2

## 2016-03-24 NOTE — Progress Notes (Signed)
03/24/2016  9:26 AM  CM met with pt for assessment. Demographics and PCP were confirmed. Pt is a 35 year old, African American female who lives in a private residence with her aunt, mother, and children (6 exterior, 2 flights interior). PTA, pt was able to complete ADLs without the use of DME. Pt has prescription drug coverage, and prefers [facility]. OBS letter provided and explained. No additional discharge service needs indicated. Pt's mother or aunt will provide transport upon discharge.    Alyssa Holmes, MA    Care Management Interventions  PCP Verified by CM: Yes (Alyssa [name])  Palliative Care Criteria Met (RRAT>21 & CHF Dx)?: No  Mode of Transport at Discharge: Other (see comment) (Mother or aunt)  MyChart Signup: No  Discharge Durable Medical Equipment: No  Physical Therapy Consult: No  Occupational Therapy Consult: No  Speech Therapy Consult: No  Current Support Network: Relative's Home  Confirm Follow Up Transport: Family  Plan discussed with Pt/Family/Caregiver: Yes  Discharge Location  Discharge Placement: Home with family assistance

## 2016-03-24 NOTE — Discharge Summary (Signed)
Gynecology Surgical Discharge Summary     Name: Alyssa [name] MRN: [id number]  SSN: [id number]    Date of Birth: 1981-02-02  Age: 35 y.o.  Sex: female      Admit date: 03/23/2016    Discharge Date: 03/24/2016      Attending Physician: Earna Coder, MD     Admission Diagnoses: MENORRHAGIA, FIBROIDS, PELVIC PAIN;Fibroids;Pelvic pain;Men*    Discharge Diagnoses: MENORRHAGIA, FIBROIDS, PELVIC PAIN;Fibroids;Pelvic pain;Men*     Procedures: Procedure(s):  LAPAROSCOPIC SUPRACERVICAL HYSTERECTOMY    Hospital Course: Normal hospital course for this procedure.  The patient was released to her home in good condition.    Significant Diagnostic Studies:   Recent Results (from the past 24 hour(s))   HCG URINE, QL. - POC    Collection Time: 03/23/16  7:22 AM   Result Value Ref Range    Pregnancy test,urine (POC) NEGATIVE  NEG     GLUCOSE, POC    Collection Time: 03/23/16  7:58 AM   Result Value Ref Range    Glucose (POC) 212 (H) 65 - 100 mg/dL    Performed by [place], POC    Collection Time: 03/23/16 12:23 PM   Result Value Ref Range    Glucose (POC) 259 (H) 65 - 100 mg/dL    Performed by Timoteo Gaul    GLUCOSE, POC    Collection Time: 03/23/16  4:37 PM   Result Value Ref Range    Glucose (POC) 216 (H) 65 - 100 mg/dL    Performed by Elijio Miles    HGB & HCT    Collection Time: 03/24/16  5:00 AM   Result Value Ref Range    HGB 8.5 (L) 11.5 - 16.0 g/dL    HCT 27.6 (L) 35.0 - 47.0 %       Patient Instructions:     Diet, activity, wound care: See printed instructions.    Follow-up Appointments   Procedures   ??? FOLLOW UP VISIT Appointment in: Two Weeks     Standing Status:   Standing     Number of Occurrences:   1     Order Specific Question:   Appointment in     Answer:   Two Weeks        Signed By:  Earna Coder, MD     March 24, 2016

## 2016-03-24 NOTE — Other (Signed)
Bedside and Verbal shift change report given to Karolee Ohs, RN (oncoming nurse) by Roxy Cedar, RN (offgoing nurse). Report included the following information SBAR, Kardex, OR Summary, Procedure Summary, Intake/Output, MAR and Recent Results.

## 2016-03-24 NOTE — Other (Signed)
03/24/16  8:18AM  PCP TOC appt scheduled with Dr. Verdon Cummins on 03/30/16 at [place], [facility]

## 2016-03-24 NOTE — Progress Notes (Signed)
Discharge instructions and prescriptions given. Patient verbalizes understanding. Patient's father picking her up. Saline lock in right and left wrist discontinued. Discharge via wheelchair with volunteer.

## 2016-03-24 NOTE — Progress Notes (Signed)
Problem: Falls - Risk of  Goal: *Absence of Falls  Document Schmid Fall Risk and appropriate interventions in the flowsheet.   Outcome: Progressing Towards Goal  Fall Risk Interventions:  Mobility Interventions: Bed/chair exit alarm, Patient to call before getting OOB, Utilize walker, cane, or other assitive device         Medication Interventions: Bed/chair exit alarm, Patient to call before getting OOB, Teach patient to arise slowly    Elimination Interventions: Bed/chair exit alarm, Call light in reach, Patient to call for help with toileting needs

## 2016-03-24 NOTE — Progress Notes (Signed)
Gynecology Progress Note    Post-op day 1. No significant complaints.  Pain fairly well controlled on current medication.  Catheter to be removed this am.     Vitals:  Blood pressure 125/60, pulse 86, temperature 99.2 ??F (37.3 ??C), resp. rate 18, last menstrual period 02/11/2016, SpO2 97 %.  Temp (24hrs), Avg:98.5 ??F (36.9 ??C), Min:98 ??F (36.7 ??C), Max:99.2 ??F (37.3 ??C)      I and O: OK    Exam:  Patient without distress.               Abdomen soft,  nontender.                 Incisions clean, dry, and intact.               Lower extremities are negative for swelling, cords, or tenderness.    Labs:   Recent Results (from the past 24 hour(s))   HCG URINE, QL. - POC    Collection Time: 03/23/16  7:22 AM   Result Value Ref Range    Pregnancy test,urine (POC) NEGATIVE  NEG     GLUCOSE, POC    Collection Time: 03/23/16  7:58 AM   Result Value Ref Range    Glucose (POC) 212 (H) 65 - 100 mg/dL    Performed by [place], POC    Collection Time: 03/23/16 12:23 PM   Result Value Ref Range    Glucose (POC) 259 (H) 65 - 100 mg/dL    Performed by Timoteo Gaul    GLUCOSE, POC    Collection Time: 03/23/16  4:37 PM   Result Value Ref Range    Glucose (POC) 216 (H) 65 - 100 mg/dL    Performed by Elijio Miles    HGB & HCT    Collection Time: 03/24/16  5:00 AM   Result Value Ref Range    HGB 8.5 (L) 11.5 - 16.0 g/dL    HCT 27.6 (L) 35.0 - 47.0 %       Assessment and Plan: Patient doing well.  Uncomplicated post Procedure(s):  LAPAROSCOPIC SUPRACERVICAL HYSTERECTOMY course.    Continue post-op care at home.  Instructions given and printed per nursing staff.

## 2016-03-24 NOTE — Discharge Summary (Signed)
Gynecology Surgical Discharge Summary     Name: Alyssa [name] MRN: [id number]  SSN: [id number]    Date of Birth: November 06, 1980  Age: 35 y.o.  Sex: female      Admit date: 03/23/2016    Discharge Date: 03/24/2016      Attending Physician: Earna Coder, MD     Admission Diagnoses: MENORRHAGIA, FIBROIDS, PELVIC PAIN;Fibroids;Pelvic pain;Men*    Discharge Diagnoses: MENORRHAGIA, FIBROIDS, PELVIC PAIN;Fibroids;Pelvic pain;Men*     Procedures: Procedure(s):  LAPAROSCOPIC SUPRACERVICAL HYSTERECTOMY    Hospital Course: Normal hospital course for this procedure.  The patient was released to her home in good condition.    Significant Diagnostic Studies:   Recent Results (from the past 24 hour(s))   HCG URINE, QL. - POC    Collection Time: 03/23/16  7:22 AM   Result Value Ref Range    Pregnancy test,urine (POC) NEGATIVE  NEG     GLUCOSE, POC    Collection Time: 03/23/16  7:58 AM   Result Value Ref Range    Glucose (POC) 212 (H) 65 - 100 mg/dL    Performed by [place], POC    Collection Time: 03/23/16 12:23 PM   Result Value Ref Range    Glucose (POC) 259 (H) 65 - 100 mg/dL    Performed by Timoteo Gaul    GLUCOSE, POC    Collection Time: 03/23/16  4:37 PM   Result Value Ref Range    Glucose (POC) 216 (H) 65 - 100 mg/dL    Performed by Elijio Miles    HGB & HCT    Collection Time: 03/24/16  5:00 AM   Result Value Ref Range    HGB 8.5 (L) 11.5 - 16.0 g/dL    HCT 27.6 (L) 35.0 - 47.0 %       Patient Instructions:     Diet, activity, wound care: See printed instructions.    Follow-up Appointments   Procedures   ??? FOLLOW UP VISIT Appointment in: Two Weeks     Standing Status:   Standing     Number of Occurrences:   1     Order Specific Question:   Appointment in     Answer:   Two Weeks        Signed By:  Earna Coder, MD     March 24, 2016

## 2016-03-27 ENCOUNTER — Emergency Department: Admit: 2016-03-28 | Payer: MEDICAID | Primary: Family Medicine

## 2016-03-27 DIAGNOSIS — G8918 Other acute postprocedural pain: Secondary | ICD-10-CM

## 2016-03-27 NOTE — ED Notes (Signed)
11:34 PM  Change of shift. Care of patient taken over from Dr. Ouida Sills by Dr. Arline Asp ; H&P reviewed, handoff complete.  Awaiting imaging. If CT scan negative, can go home.     Note written by Ruben Reason, Scribe, as dictated by Celene Skeen, MD 11:35 PM    CONSULT NOTE:  1:42 AM Nadara Mustard, MD spoke with Dr. Jimmye Norman, Consult for OB/GYN.  Discussed CT scan results. Recommends discharge home and f/u in office with return precautions.

## 2016-03-27 NOTE — ED Notes (Signed)
Josh, PCT at the bedside attempting to insert IV via ultrasound.

## 2016-03-27 NOTE — ED Notes (Signed)
Unable to draw blood or flush current IV. Pt requesting ultrasound for new IV insertion. Pt states that she is a difficult stick and is refusing further IV insertions without ultra sound machine.

## 2016-03-27 NOTE — ED Triage Notes (Signed)
Pt arrives via EMS from home with complaints of post op pain. Pt had partial hysterectomy on Wednesday due to endometriosis and "heavy bleeding during my cycle with irregular periods." Pt was discharged from [facility] on Thursday and began having abd pain Saturday morning. Pt taking percocet and dilaudid with no relief. Pt contacted her doctor and was given an antibiotic. Pt reports throbbing pain 9/10 in the mid abdominal area. Pt has hx of colectomy in 2013.

## 2016-03-27 NOTE — ED Notes (Signed)
Pt very teary during triage. Family at the bedside. Provided pt with pillow and blanket for comfort.    Dr. Ouida Sills at the bedside.
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
## 2016-03-27 NOTE — ED Notes (Addendum)
4 small incisions noted on pt's abd upon arrival. steristrips noted on 3 of 4 incision. No bleeding noted on any incision. Redness noted on upper mid abd incision.

## 2016-03-28 ENCOUNTER — Emergency Department

## 2016-03-28 ENCOUNTER — Inpatient Hospital Stay
Admit: 2016-03-28 | Discharge: 2016-03-28 | Disposition: A | Discharge status: Home / Self Care | Payer: MEDICAID | Attending: Student in an Organized Health Care Education/Training Program

## 2016-03-28 ENCOUNTER — Ambulatory Visit
Admit: 2016-03-28 | Discharge: 2016-03-28 | Payer: PRIVATE HEALTH INSURANCE | Attending: Obstetrics & Gynecology | Primary: Family Medicine

## 2016-03-28 DIAGNOSIS — Z09 Encounter for follow-up examination after completed treatment for conditions other than malignant neoplasm: Secondary | ICD-10-CM

## 2016-03-28 LAB — CBC WITH AUTOMATED DIFF
ABS. BASOPHILS: 0 10*3/uL (ref 0.0–0.1)
ABS. EOSINOPHILS: 0.1 10*3/uL (ref 0.0–0.4)
ABS. LYMPHOCYTES: 2.1 10*3/uL (ref 0.8–3.5)
ABS. MONOCYTES: 1.3 10*3/uL — ABNORMAL HIGH (ref 0.0–1.0)
ABS. NEUTROPHILS: 8.6 10*3/uL — ABNORMAL HIGH (ref 1.8–8.0)
BASOPHILS: 0 % (ref 0–1)
EOSINOPHILS: 1 % (ref 0–7)
HCT: 28.8 % — ABNORMAL LOW (ref 35.0–47.0)
HGB: 9.1 g/dL — ABNORMAL LOW (ref 11.5–16.0)
LYMPHOCYTES: 17 % (ref 12–49)
MCH: 23.5 PG — ABNORMAL LOW (ref 26.0–34.0)
MCHC: 31.6 g/dL (ref 30.0–36.5)
MCV: 74.2 FL — ABNORMAL LOW (ref 80.0–99.0)
MONOCYTES: 11 % (ref 5–13)
NEUTROPHILS: 71 % (ref 32–75)
PLATELET: 558 10*3/uL — ABNORMAL HIGH (ref 150–400)
RBC: 3.88 M/uL (ref 3.80–5.20)
RDW: 17.1 % — ABNORMAL HIGH (ref 11.5–14.5)
WBC: 12.1 10*3/uL — ABNORMAL HIGH (ref 3.6–11.0)

## 2016-03-28 LAB — LIPASE: Lipase: 93 U/L (ref 73–393)

## 2016-03-28 LAB — METABOLIC PANEL, COMPREHENSIVE
A-G Ratio: 0.5 — ABNORMAL LOW (ref 1.1–2.2)
ALT (SGPT): 20 U/L (ref 12–78)
AST (SGOT): 32 U/L (ref 15–37)
Albumin: 2.7 g/dL — ABNORMAL LOW (ref 3.5–5.0)
Alk. phosphatase: 102 U/L (ref 45–117)
Anion gap: 8 mmol/L (ref 5–15)
BUN/Creatinine ratio: 5 — ABNORMAL LOW (ref 12–20)
BUN: 4 MG/DL — ABNORMAL LOW (ref 6–20)
Bilirubin, total: 0.4 MG/DL (ref 0.2–1.0)
CO2: 29 mmol/L (ref 21–32)
Calcium: 8.6 MG/DL (ref 8.5–10.1)
Chloride: 98 mmol/L (ref 97–108)
Creatinine: 0.84 MG/DL (ref 0.55–1.02)
GFR est AA: >60 mL/min/{1.73_m2} (ref >60)
GFR est non-AA: >60 mL/min/{1.73_m2} (ref >60)
Globulin: 5.3 g/dL — ABNORMAL HIGH (ref 2.0–4.0)
Glucose: 228 mg/dL — ABNORMAL HIGH (ref 65–100)
Potassium: 4 mmol/L (ref 3.5–5.1)
Protein, total: 8 g/dL (ref 6.4–8.2)
Sodium: 135 mmol/L — ABNORMAL LOW (ref 136–145)

## 2016-03-28 LAB — LACTIC ACID: Lactic acid: 1.3 MMOL/L (ref 0.4–2.0)

## 2016-03-28 MED ORDER — MORPHINE 2 MG/ML INJECTION
2 mg/mL | INTRAMUSCULAR | Status: DC
Start: 2016-03-28 — End: 2016-03-28

## 2016-03-28 MED ORDER — SODIUM CHLORIDE 0.9% BOLUS IV
0.9 % | Freq: Once | INTRAVENOUS | Status: AC
Start: 2016-03-28 — End: 2016-03-28
  Administered 2016-03-28: 04:00:00 via INTRAVENOUS

## 2016-03-28 MED ORDER — MORPHINE 2 MG/ML INJECTION
2 mg/mL | Freq: Once | INTRAMUSCULAR | Status: AC
Start: 2016-03-28 — End: 2016-03-28
  Administered 2016-03-28: 06:00:00 via INTRAVENOUS

## 2016-03-28 MED ORDER — KETOROLAC TROMETHAMINE 30 MG/ML INJECTION
30 mg/mL (1 mL) | INTRAMUSCULAR | Status: AC
Start: 2016-03-28 — End: 2016-03-27
  Administered 2016-03-28: 04:00:00 via INTRAVENOUS

## 2016-03-28 MED ORDER — KETOROLAC TROMETHAMINE 10 MG TAB
10 mg | ORAL_TABLET | Freq: Four times a day (QID) | ORAL | 0 refills | Status: AC | PRN
Start: 2016-03-28 — End: 2016-03-30

## 2016-03-28 MED ORDER — METRONIDAZOLE 500 MG TAB
500 mg | ORAL_TABLET | Freq: Three times a day (TID) | ORAL | 0 refills | Status: DC
Start: 2016-03-28 — End: 2016-04-07

## 2016-03-28 MED ORDER — SODIUM CHLORIDE 0.9% BOLUS IV
0.9 % | Freq: Once | INTRAVENOUS | Status: AC
Start: 2016-03-28 — End: 2016-03-28
  Administered 2016-03-28: 05:00:00 via INTRAVENOUS

## 2016-03-28 MED ORDER — SODIUM CHLORIDE 0.9 % IJ SYRG
Freq: Once | INTRAMUSCULAR | Status: AC
Start: 2016-03-28 — End: 2016-03-28
  Administered 2016-03-28: 05:00:00 via INTRAVENOUS

## 2016-03-28 MED ORDER — IOPAMIDOL 76 % IV SOLN
370 mg iodine /mL (76 %) | Freq: Once | INTRAVENOUS | Status: AC
Start: 2016-03-28 — End: 2016-03-28
  Administered 2016-03-28: 05:00:00 via INTRAVENOUS

## 2016-03-28 MED FILL — KETOROLAC TROMETHAMINE 30 MG/ML INJECTION: 30 mg/mL (1 mL) | INTRAMUSCULAR | Qty: 1

## 2016-03-28 MED FILL — ISOVUE-370  76 % INTRAVENOUS SOLUTION: 370 mg iodine /mL (76 %) | INTRAVENOUS | Qty: 100

## 2016-03-28 MED FILL — NORMAL SALINE FLUSH 0.9 % INJECTION SYRINGE: INTRAMUSCULAR | Qty: 10

## 2016-03-28 MED FILL — SODIUM CHLORIDE 0.9 % IV: INTRAVENOUS | Qty: 1000

## 2016-03-28 MED FILL — MORPHINE 2 MG/ML INJECTION: 2 mg/mL | INTRAMUSCULAR | Qty: 2

## 2016-03-28 NOTE — Telephone Encounter (Signed)
Patient calling stating that she has a Laparoscopic Supracervical hysterectomy on 03/23/16 and reports that she went to [facility] ER last night for severe pain and bleeding from her incisional sites.  Patient states that she was given a CT scan which showed fluid under one of the incision sites and gas within her system.  Patient was advised to follow up with Dr. Reymundo Poll today.  Patient reports that her whole abdomen hurts and gets rock hard at times.  She has been taking a stool softener with no relief.  She has been passing gas ok, but still feels like there is some still there.  She also reports that she has not had a BM since before the surgery.    Patient encouraged to use stool softener, miralax or milk of magnesia to help her bowels, increase her water intake, walk around her house to help move the gas and if need be as a last resort, she can use an enema.  Patient aware that I will be sending the message to [facility] for further guidance.    Please advise if patient needs to be seen.

## 2016-03-28 NOTE — ED Notes (Signed)
Pt requesting to speak with MD regarding discharge.

## 2016-03-28 NOTE — Telephone Encounter (Signed)
Patient aware and will come into the office today.  Patient aware that she is being worked into the schedule so there may be a wait.  Patient verbalized understanding.

## 2016-03-28 NOTE — Telephone Encounter (Signed)
Have her come in this afternoon

## 2016-03-28 NOTE — ED Notes (Signed)
Pt repositioned on stretcher for comfort. Pt states that pain is returning to 6/10 after movement from ct to stretcher. MD notified.
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
## 2016-03-28 NOTE — ED Notes (Signed)
Discharge instructions given to pt.  All questions answered and pt verbalized understanding.   V/S stable @ time of discharge. No lines or drains in place. Pt ambulatory out of unit and taken home by family.

## 2016-03-30 ENCOUNTER — Encounter: Attending: Internal Medicine | Primary: Family Medicine

## 2016-03-30 NOTE — Telephone Encounter (Signed)
Rec'd page through answering service.  Per message, pt asking what she can take for pain.    Tried calling number provided [phone]) twice.  Ph number out of service.  Contacted answering service, asked them to get different number.    Paged with a different number [phone]).  This number also out of service.

## 2016-03-31 MED ORDER — TRAMADOL 50 MG TAB
50 mg | ORAL_TABLET | Freq: Four times a day (QID) | ORAL | 0 refills | Status: DC | PRN
Start: 2016-03-31 — End: 2016-07-01

## 2016-03-31 NOTE — Telephone Encounter (Signed)
Only supposed to take Toradol for 5 days max.  So can't have that.  I can give her rx for Tramadol for another 5 days but will have to pick it up.

## 2016-03-31 NOTE — Telephone Encounter (Signed)
Patient states that she had surgery by Dr. Reymundo Poll on 03/23/16 and is requesting more pain medication.  She states that she has not had any pain medication in her system over the past 1.5 days.    She states that she was seen on 03-27-16 at the ER for complications.    Patient is requesting a prescription of Torodol 10 mg 1 tab q6h. Since she gets less drowsy taking this.  She also has used Percocet and Dilaudid in the past.   I see where Toradol was written for 12 tabs take 1 tab q6h by Dr. Ouida Sills in ER.    Please advise.

## 2016-03-31 NOTE — Telephone Encounter (Addendum)
Patient is requesting Tramadol.  Will be picked up by Damian Leavell per [facility].     Rx placed at front desk for pickup.  Patient aware rx will be written.

## 2016-04-01 ENCOUNTER — Ambulatory Visit
Admit: 2016-04-01 | Discharge: 2016-04-01 | Payer: PRIVATE HEALTH INSURANCE | Attending: Internal Medicine | Primary: Family Medicine

## 2016-04-01 DIAGNOSIS — Z90710 Acquired absence of both cervix and uterus: Secondary | ICD-10-CM

## 2016-04-01 MED ORDER — IBUPROFEN 600 MG TAB
600 mg | ORAL_TABLET | Freq: Three times a day (TID) | ORAL | 1 refills | Status: DC | PRN
Start: 2016-04-01 — End: 2016-05-03

## 2016-04-01 MED ORDER — GABAPENTIN 300 MG CAP
300 mg | ORAL_CAPSULE | Freq: Three times a day (TID) | ORAL | 0 refills | Status: DC | PRN
Start: 2016-04-01 — End: 2016-04-17
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
## 2016-04-07 ENCOUNTER — Ambulatory Visit: Admit: 2016-04-07 | Payer: PRIVATE HEALTH INSURANCE | Attending: Internal Medicine | Primary: Family Medicine

## 2016-04-07 DIAGNOSIS — G8918 Other acute postprocedural pain: Secondary | ICD-10-CM
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
## 2016-04-12 NOTE — Telephone Encounter (Signed)
Left msg for patient that Per Dr. Verdon Cummins he would like patient to request a letter from the surgeon that completed the procedure.

## 2016-04-12 NOTE — Telephone Encounter (Signed)
Pt needs a letter stating she is going back to work on 04/17/16.  She would like it faxed to her work at this number [phone].  AttnMorey Hummingbird

## 2016-04-12 NOTE — Telephone Encounter (Signed)
35 year old patient who had laparoscopic supracervical hysterectomy on 03/23/16. She is requesting a letter clearing her to go back to work. Is patient cleared to return to work with no restrictions?    The letter needs to be faxed to [phone], Attn: Morey Hummingbird.

## 2016-04-13 NOTE — Telephone Encounter (Signed)
Note faxed, confirmation received.

## 2016-04-13 NOTE — Telephone Encounter (Signed)
Patient returned call to Gilmore Laroche to state that her return to work letter should be written for 04/17/16.  She states that the letter should be forwarded to the fax number below in Norcatur Snelson's note.

## 2016-04-13 NOTE — Telephone Encounter (Signed)
LMTCB, need date that patient is returning back to work.

## 2016-04-13 NOTE — Telephone Encounter (Signed)
May work with no restrictions.

## 2016-04-17 ENCOUNTER — Encounter

## 2016-04-17 MED ORDER — GABAPENTIN 300 MG CAP
300 mg | ORAL_CAPSULE | ORAL | 1 refills | Status: DC
Start: 2016-04-17 — End: 2016-06-13

## 2016-04-26 ENCOUNTER — Emergency Department: Admit: 2016-04-26 | Payer: MEDICAID | Primary: Family Medicine

## 2016-04-26 ENCOUNTER — Inpatient Hospital Stay
Admit: 2016-04-26 | Discharge: 2016-04-26 | Disposition: A | Discharge status: Home / Self Care | Payer: MEDICAID | Attending: Emergency Medicine

## 2016-04-26 DIAGNOSIS — R1013 Epigastric pain: Secondary | ICD-10-CM

## 2016-04-26 LAB — METABOLIC PANEL, COMPREHENSIVE
A-G Ratio: 0.8 — ABNORMAL LOW (ref 1.1–2.2)
ALT (SGPT): 18 U/L (ref 12–78)
AST (SGOT): 12 U/L — ABNORMAL LOW (ref 15–37)
Albumin: 3.5 g/dL (ref 3.5–5.0)
Alk. phosphatase: 68 U/L (ref 45–117)
Anion gap: 9 mmol/L (ref 5–15)
BUN/Creatinine ratio: 13 (ref 12–20)
BUN: 10 MG/DL (ref 6–20)
Bilirubin, total: 0.3 MG/DL (ref 0.2–1.0)
CO2: 27 mmol/L (ref 21–32)
Calcium: 8.8 MG/DL (ref 8.5–10.1)
Chloride: 101 mmol/L (ref 97–108)
Creatinine: 0.75 MG/DL (ref 0.55–1.02)
GFR est AA: >60 mL/min/{1.73_m2} (ref >60)
GFR est non-AA: >60 mL/min/{1.73_m2} (ref >60)
Globulin: 4.6 g/dL — ABNORMAL HIGH (ref 2.0–4.0)
Glucose: 235 mg/dL — ABNORMAL HIGH (ref 65–100)
Potassium: 3.7 mmol/L (ref 3.5–5.1)
Protein, total: 8.1 g/dL (ref 6.4–8.2)
Sodium: 137 mmol/L (ref 136–145)

## 2016-04-26 LAB — URINALYSIS W/ RFLX MICROSCOPIC
Bilirubin: NEGATIVE
Blood: NEGATIVE
Glucose: 500 mg/dL — AB
Ketone: NEGATIVE mg/dL
Leukocyte Esterase: NEGATIVE
Nitrites: NEGATIVE
Protein: 30 mg/dL — AB
Specific gravity: >1.03 — ABNORMAL HIGH (ref 1.003–1.030)
Urobilinogen: 0.2 EU/dL (ref 0.2–1.0)
pH (UA): 6 (ref 5.0–8.0)

## 2016-04-26 LAB — CBC WITH AUTOMATED DIFF
ABS. BASOPHILS: 0.1 10*3/uL (ref 0.0–0.1)
ABS. EOSINOPHILS: 0.1 10*3/uL (ref 0.0–0.4)
ABS. LYMPHOCYTES: 3.1 10*3/uL (ref 0.8–3.5)
ABS. MONOCYTES: 0.6 10*3/uL (ref 0.0–1.0)
ABS. NEUTROPHILS: 4.6 10*3/uL (ref 1.8–8.0)
BASOPHILS: 1 % (ref 0–1)
EOSINOPHILS: 2 % (ref 0–7)
HCT: 31.7 % — ABNORMAL LOW (ref 35.0–47.0)
HGB: 9.8 g/dL — ABNORMAL LOW (ref 11.5–16.0)
LYMPHOCYTES: 36 % (ref 12–49)
MCH: 22.2 PG — ABNORMAL LOW (ref 26.0–34.0)
MCHC: 30.9 g/dL (ref 30.0–36.5)
MCV: 71.7 FL — ABNORMAL LOW (ref 80.0–99.0)
MONOCYTES: 7 % (ref 5–13)
NEUTROPHILS: 54 % (ref 32–75)
PLATELET: 434 10*3/uL — ABNORMAL HIGH (ref 150–400)
RBC: 4.42 M/uL (ref 3.80–5.20)
RDW: 18.8 % — ABNORMAL HIGH (ref 11.5–14.5)
WBC: 8.5 10*3/uL (ref 3.6–11.0)

## 2016-04-26 LAB — LIPASE: Lipase: 249 U/L (ref 73–393)

## 2016-04-26 MED ORDER — IOPAMIDOL 76 % IV SOLN
370 mg iodine /mL (76 %) | Freq: Once | INTRAVENOUS | Status: AC
Start: 2016-04-26 — End: 2016-04-26
  Administered 2016-04-26: 07:00:00 via INTRAVENOUS

## 2016-04-26 MED ORDER — HYDROMORPHONE 2 MG/ML INJECTION SOLUTION
2 mg/mL | INTRAMUSCULAR | Status: AC
Start: 2016-04-26 — End: 2016-04-26
  Administered 2016-04-26: 06:00:00 via INTRAVENOUS

## 2016-04-26 MED ORDER — LIDOCAINE 2 % MUCOSAL SOLN
2 % | Freq: Once | Status: AC
Start: 2016-04-26 — End: 2016-04-26
  Administered 2016-04-26: 10:00:00 via ORAL

## 2016-04-26 MED ORDER — SODIUM CHLORIDE 0.9% BOLUS IV
0.9 % | Freq: Once | INTRAVENOUS | Status: AC
Start: 2016-04-26 — End: 2016-04-26
  Administered 2016-04-26: 06:00:00 via INTRAVENOUS

## 2016-04-26 MED ORDER — FAMOTIDINE (PF) 20 MG/2 ML IV
20 mg/2 mL | INTRAVENOUS | Status: AC
Start: 2016-04-26 — End: 2016-04-26
  Administered 2016-04-26: 09:00:00 via INTRAVENOUS

## 2016-04-26 MED ORDER — ESOMEPRAZOLE MAGNESIUM 40 MG CAP, DELAYED RELEASE
40 mg | ORAL_CAPSULE | Freq: Every day | ORAL | 0 refills | Status: AC
Start: 2016-04-26 — End: 2016-05-26

## 2016-04-26 MED ORDER — ONDANSETRON (PF) 4 MG/2 ML INJECTION
4 mg/2 mL | INTRAMUSCULAR | Status: AC
Start: 2016-04-26 — End: 2016-04-26
  Administered 2016-04-26: 10:00:00

## 2016-04-26 MED ORDER — ONDANSETRON (PF) 4 MG/2 ML INJECTION
4 mg/2 mL | INTRAMUSCULAR | Status: AC
Start: 2016-04-26 — End: 2016-04-26
  Administered 2016-04-26: 06:00:00 via INTRAVENOUS

## 2016-04-26 MED ORDER — ONDANSETRON 8 MG TAB, RAPID DISSOLVE
8 mg | ORAL_TABLET | Freq: Three times a day (TID) | ORAL | 0 refills | Status: AC | PRN
Start: 2016-04-26 — End: 2016-05-03

## 2016-04-26 MED FILL — MAG-AL PLUS 200 MG-200 MG-20 MG/5 ML ORAL SUSPENSION: 200-200-20 mg/5 mL | ORAL | Qty: 30

## 2016-04-26 MED FILL — SODIUM CHLORIDE 0.9 % IV: INTRAVENOUS | Qty: 1000

## 2016-04-26 MED FILL — HYDROMORPHONE 2 MG/ML INJECTION SOLUTION: 2 mg/mL | INTRAMUSCULAR | Qty: 1

## 2016-04-26 MED FILL — ONDANSETRON (PF) 4 MG/2 ML INJECTION: 4 mg/2 mL | INTRAMUSCULAR | Qty: 2

## 2016-04-26 MED FILL — FAMOTIDINE (PF) 20 MG/2 ML IV: 20 mg/2 mL | INTRAVENOUS | Qty: 2

## 2016-04-26 MED FILL — ISOVUE-370  76 % INTRAVENOUS SOLUTION: 370 mg iodine /mL (76 %) | INTRAVENOUS | Qty: 100

## 2016-04-26 NOTE — ED Notes (Signed)
Pt d/c by provider, vss as noted

## 2016-04-26 NOTE — ED Triage Notes (Signed)
Pushing up a window with a broom stick on Saturday 1/6. When doing so felt a ripping sensation in the middle of the abdomen. taken ibuprofen with minimal relief.
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
## 2016-05-03 ENCOUNTER — Encounter

## 2016-05-04 ENCOUNTER — Encounter

## 2016-05-04 MED ORDER — IBUPROFEN 600 MG TAB
600 mg | ORAL_TABLET | ORAL | 1 refills | Status: DC
Start: 2016-05-04 — End: 2016-07-26

## 2016-06-13 ENCOUNTER — Encounter

## 2016-06-13 MED ORDER — LISINOPRIL 10 MG TAB
10 mg | ORAL_TABLET | ORAL | 3 refills | Status: DC
Start: 2016-06-13 — End: 2016-10-04

## 2016-06-13 MED ORDER — JANUMET 50 MG-1,000 MG TABLET
50-1000 mg | ORAL_TABLET | ORAL | 3 refills | Status: DC
Start: 2016-06-13 — End: 2016-10-04

## 2016-06-13 MED ORDER — GABAPENTIN 300 MG CAP
300 mg | ORAL_CAPSULE | ORAL | 1 refills | Status: DC
Start: 2016-06-13 — End: 2016-07-28

## 2016-06-13 MED ORDER — GLIPIZIDE 10 MG TAB
10 mg | ORAL_TABLET | ORAL | 3 refills | Status: DC
Start: 2016-06-13 — End: 2016-10-04

## 2016-06-21 ENCOUNTER — Ambulatory Visit
Admit: 2016-06-21 | Discharge: 2016-06-21 | Payer: PRIVATE HEALTH INSURANCE | Attending: Nurse Practitioner | Primary: Family Medicine

## 2016-06-21 DIAGNOSIS — J40 Bronchitis, not specified as acute or chronic: Secondary | ICD-10-CM

## 2016-06-21 MED ORDER — LEVALBUTEROL 1.25 MG/3 ML SOLN FOR INHALATION
1.25 mg/3 mL | RESPIRATORY_TRACT | 0 refills | Status: AC
Start: 2016-06-21 — End: 2016-06-21

## 2016-06-21 MED ORDER — DEXTROMETHORPHAN-GUAIFENESIN SR 30 MG-600 MG 12 HR TAB
600-30 mg | ORAL_TABLET | Freq: Two times a day (BID) | ORAL | 0 refills | Status: DC | PRN
Start: 2016-06-21 — End: 2016-07-01

## 2016-06-21 MED ORDER — FLUTICASONE 200 MCG-VILANTEROL 25 MCG/DOSE BREATH ACTIVATED INHALER
200-25 mcg/dose | Freq: Every day | RESPIRATORY_TRACT | 0 refills | Status: DC
Start: 2016-06-21 — End: 2016-07-28

## 2016-06-21 MED ORDER — AZITHROMYCIN 250 MG TAB
250 mg | ORAL_TABLET | ORAL | 0 refills | Status: AC
Start: 2016-06-21 — End: 2016-06-26

## 2016-06-21 MED ORDER — ALBUTEROL SULFATE HFA 90 MCG/ACTUATION AEROSOL INHALER
90 mcg/actuation | RESPIRATORY_TRACT | 2 refills | Status: DC | PRN
Start: 2016-06-21 — End: 2017-03-07

## 2016-06-21 NOTE — Progress Notes (Signed)
HISTORY OF PRESENT ILLNESS  Alyssa [name] is a 36 y.o. female.  HPI  Upper respiratory illness:  Alyssa [name] presents with complaints of congestion, sore throat, post nasal drip, cough described as harsh, productive of thick white sputum, headache and milky nasal discharge for 6 days.  no nausea and no vomiting .  she has not had  Myalgias but has felt chilled at times; has been feeling tighter in chest over the past 2 days and has noted wheezing.  Symptoms are moderate.  Over-the-counter remedies including Dayquil and Nyquil  has been used with poor relief of symptoms.  Has history of asthma in past and was hospitalized for Pneumonia in 2012.  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  Drinking plenty of fluids: yes  Asthma?:  yes  former smoker, quit 7 years ago  Contacts with similar infections: yes         Review of Systems   Constitutional: Positive for chills, fever and malaise/fatigue.   HENT: Positive for congestion and sore throat. Negative for ear pain and sinus pain.    Respiratory: Positive for cough, sputum production and wheezing.    Cardiovascular: Negative for chest pain and palpitations.   Gastrointestinal: Negative for abdominal pain, constipation, diarrhea, nausea and vomiting.   Genitourinary: Negative for dysuria and frequency.   Musculoskeletal: Negative for myalgias.   Neurological: Positive for headaches. Negative for dizziness and tingling.     BP 132/78 (BP 1 Location: Left arm, BP Patient Position: Sitting)   Pulse 89   Temp 99.4 ??F (37.4 ??C) (Oral)    Resp 12   Ht 5\' 3"  (1.6 m)   Wt 260 lb 6.4 oz (118.1 kg)   LMP 02/11/2016 Comment: has heavy periods per patient   SpO2 98%   BMI 46.13 kg/m2  Physical Exam   Constitutional: She is oriented to person, place, and time. She appears well-developed and well-nourished.   HENT:   Head: Normocephalic and atraumatic.    Right Ear: Tympanic membrane and external ear normal.   Left Ear: Tympanic membrane and external ear normal.   Nose: Mucosal edema present. Right sinus exhibits no maxillary sinus tenderness. Left sinus exhibits no maxillary sinus tenderness.   Mouth/Throat: Posterior oropharyngeal erythema present. No posterior oropharyngeal edema.   Neck: Normal range of motion. Neck supple. No thyromegaly present.   Cardiovascular: Normal rate and regular rhythm.    Pulmonary/Chest: Effort normal. She has wheezes.   Harsh cough with scattered wheezing   Lymphadenopathy:     She has no cervical adenopathy.   Neurological: She is alert and oriented to person, place, and time.   Skin: Skin is warm and dry.   Psychiatric: She has a normal mood and affect. Her behavior is normal.   Nursing note and vitals reviewed.      ASSESSMENT and PLAN  Diagnoses and all orders for this visit:    1. Bronchitis -- symptoms progressively worsening; will cover for atypicals with Zpack  -     azithromycin (ZITHROMAX) 250 mg tablet; Take 1 Tab by mouth See Admin Instructions for 5 days.  -     guaiFENesin-dextromethorphan SR (MUCINEX DM) 600-30 mg per tablet; Take 1 Tab by mouth every twelve (12) hours as needed for Cough.    2. Upper respiratory tract infection, unspecified type  -     azithromycin (ZITHROMAX) 250 mg tablet; Take 1 Tab by mouth  See Admin Instructions for 5 days.  -     guaiFENesin-dextromethorphan SR (MUCINEX DM) 600-30 mg per tablet; Take 1 Tab by mouth every twelve (12) hours as needed for Cough.    3. Mild intermittent reactive airway disease with acute exacerbation -- Xopenex nebulizer treatment given with improvement of air exchange.  -     LEVALBUTEROL, INHAL. SOL., FDA-APPROVED FINAL, NON-COMPOUND UNIT DOSE, 0.5 MG  -     levalbuterol (XOPENEX) 1.25 mg/3 mL nebu; 3 mL by Nebulization route now for 1 dose.  -     PR PRESSURIZED/NONPRESSURIZED INHALATION TREATMENT   -     albuterol (PROVENTIL HFA, VENTOLIN HFA, PROAIR HFA) 90 mcg/actuation inhaler; Take 2 Puffs by inhalation every four (4) hours as needed for Wheezing.  -     fluticasone-vilanterol (BREO ELLIPTA) 200-25 mcg/dose inhaler; Take 1 Puff by inhalation daily. Rinse mouth with water after each use    Follow up if signs and symptoms worsen or change. After hours number given.   lab results and schedule of future lab studies reviewed with patient  reviewed diet, exercise and weight control  reviewed medications and side effects in detail

## 2016-06-21 NOTE — Patient Instructions (Addendum)
Bronchitis: Care Instructions  Your Care Instructions    Bronchitis is inflammation of the bronchial tubes, which carry air to the lungs. The tubes swell and produce mucus, or phlegm. The mucus and inflamed bronchial tubes make you cough. You may have trouble breathing.  Most cases of bronchitis are caused by viruses like those that cause colds. Antibiotics usually do not help and they may be harmful.  Bronchitis usually develops rapidly and lasts about 2 to 3 weeks in otherwise healthy people.  Follow-up care is a key part of your treatment and safety. Be sure to make and go to all appointments, and call your doctor if you are having problems. It's also a good idea to know your test results and keep a list of the medicines you take.  How can you care for yourself at home?  ?? Take all medicines exactly as prescribed. Call your doctor if you think you are having a problem with your medicine.  ?? Get some extra rest.  ?? Take an over-the-counter pain medicine, such as acetaminophen (Tylenol), ibuprofen (Advil, Motrin), or naproxen (Aleve) to reduce fever and relieve body aches. Read and follow all instructions on the label.  ?? Do not take two or more pain medicines at the same time unless the doctor told you to. Many pain medicines have acetaminophen, which is Tylenol. Too much acetaminophen (Tylenol) can be harmful.  ?? Take an over-the-counter cough medicine that contains dextromethorphan to help quiet a dry, hacking cough so that you can sleep. Avoid cough medicines that have more than one active ingredient. Read and follow all instructions on the label.  ?? Breathe moist air from a humidifier, hot shower, or sink filled with hot water. The heat and moisture will thin mucus so you can cough it out.  ?? Do not smoke. Smoking can make bronchitis worse. If you need help quitting, talk to your doctor about stop-smoking programs and medicines. These can increase your chances of quitting for good.   When should you call for help?  Call 911 anytime you think you may need emergency care. For example, call if:  ? ?? You have severe trouble breathing.   ?Call your doctor now or seek immediate medical care if:  ? ?? You have new or worse trouble breathing.   ? ?? You cough up dark brown or bloody mucus (sputum).   ? ?? You have a new or higher fever.   ? ?? You have a new rash.   ?Watch closely for changes in your health, and be sure to contact your doctor if:  ? ?? You cough more deeply or more often, especially if you notice more mucus or a change in the color of your mucus.   ? ?? You are not getting better as expected.   Where can you learn more?  Go to http://www.healthwise.net/GoodHelpConnections.  Enter H333 in the search box to learn more about "Bronchitis: Care Instructions."  Current as of: Aug 28, 2015  Content Version: 11.4  ?? 2006-2017 [employer]. Care instructions adapted under license by [employer] (which disclaims liability or warranty for this information). If you have questions about a medical condition or this instruction, always ask your healthcare professional. Healthwise, Inc[employer] disclaims any warranty or liability for your use of this information.       Asthma Attack: Care Instructions  Your Care Instructions    During an asthma attack, the airways swell and narrow. This makes it hard to breathe. Severe asthma   attacks can be life-threatening, but you can help prevent them by keeping your asthma under control and treating symptoms before they get bad. Symptoms include being short of breath, having chest tightness, coughing, and wheezing. Noting and treating these symptoms can also help you avoid future trips to the emergency room.  The doctor has checked you carefully, but problems can develop later. If you notice any problems or new symptoms, get medical treatment right away.  Follow-up care is a key part of your treatment and safety. Be sure to make  and go to all appointments, and call your doctor if you are having problems. It's also a good idea to know your test results and keep a list of the medicines you take.  How can you care for yourself at home?  ?? Follow your asthma action plan to prevent and treat attacks. If you don't have an asthma action plan, work with your doctor to create one.  ?? Take your asthma medicines exactly as prescribed. Talk to your doctor right away if you have any questions about how to take them.  ?? Use your quick-relief medicine when you have symptoms of an attack. Quick-relief medicine is usually an albuterol inhaler. Some people need to use quick-relief medicine before they exercise.  ?? Take your controller medicine every day, not just when you have symptoms. Controller medicine is usually an inhaled corticosteroid. The goal is to prevent problems before they occur. Don't use your controller medicine to treat an attack that has already started. It doesn't work fast enough to help.  ?? If your doctor prescribed corticosteroid pills to use during an attack, take them exactly as prescribed. It may take hours for the pills to work, but they may make the episode shorter and help you breathe better.  ?? Keep your quick-relief medicine with you at all times.  ?? Talk to your doctor before using other medicines. Some medicines, such as aspirin, can cause asthma attacks in some people.  ?? If you have a peak flow meter, use it to check how well you are breathing. This can help you predict when an asthma attack is going to occur. Then you can take medicine to prevent the asthma attack or make it less severe.  ?? Do not smoke or allow others to smoke around you. Avoid smoky places. Smoking makes asthma worse. If you need help quitting, talk to your doctor about stop-smoking programs and medicines. These can increase your chances of quitting for good.  ?? Learn what triggers an asthma attack for you, and avoid the triggers  when you can. Common triggers include colds, smoke, air pollution, dust, pollen, mold, pets, cockroaches, stress, and cold air.  ?? Avoid colds and the flu. Get a pneumococcal vaccine shot. If you have had one before, ask your doctor if you need a second dose. Get a flu vaccine every fall. If you must be around people with colds or the flu, wash your hands often.  When should you call for help?  Call 911 anytime you think you may need emergency care. For example, call if:  ? ?? You have severe trouble breathing.   ?Call your doctor now or seek immediate medical care if:  ? ?? Your symptoms do not get better after you have followed your asthma action plan.   ? ?? You have new or worse trouble breathing.   ? ?? Your coughing and wheezing get worse.   ? ?? You cough up dark brown or bloody   mucus (sputum).   ? ?? You have a new or higher fever.   ?Watch closely for changes in your health, and be sure to contact your doctor if:  ? ?? You need to use quick-relief medicine on more than 2 days a week (unless it is just for exercise).   ? ?? You cough more deeply or more often, especially if you notice more mucus or a change in the color of your mucus.   ? ?? You are not getting better as expected.   Where can you learn more?  Go to http://www.healthwise.net/GoodHelpConnections.  Enter F084 in the search box to learn more about "Asthma Attack: Care Instructions."  Current as of: Aug 28, 2015  Content Version: 11.4  ?? 2006-2017 [employer]. Care instructions adapted under license by [employer] (which disclaims liability or warranty for this information). If you have questions about a medical condition or this instruction, always ask your healthcare professional. [employer] disclaims any warranty or liability for your use of this information.

## 2016-07-01 ENCOUNTER — Ambulatory Visit: Admit: 2016-07-01 | Payer: PRIVATE HEALTH INSURANCE | Attending: Internal Medicine | Primary: Family Medicine

## 2016-07-01 DIAGNOSIS — E118 Type 2 diabetes mellitus with unspecified complications: Secondary | ICD-10-CM

## 2016-07-01 NOTE — Patient Instructions (Signed)
High Blood Pressure: Care Instructions  Your Care Instructions    If your blood pressure is usually above 140/90, you have high blood pressure, or hypertension. That means the top number is 140 or higher or the bottom number is 90 or higher, or both.  Despite what a lot of people think, high blood pressure usually doesn't cause headaches or make you feel dizzy or lightheaded. It usually has no symptoms. But it does increase your risk for heart attack, stroke, and kidney or eye damage. The higher your blood pressure, the more your risk increases.  Your doctor will give you a goal for your blood pressure. Your goal will be based on your health and your age. An example of a goal is to keep your blood pressure below 140/90.  Lifestyle changes, such as eating healthy and being active, are always important to help lower blood pressure. You might also take medicine to reach your blood pressure goal.  Follow-up care is a key part of your treatment and safety. Be sure to make and go to all appointments, and call your doctor if you are having problems. It's also a good idea to know your test results and keep a list of the medicines you take.  How can you care for yourself at home?  Medical treatment  ?? If you stop taking your medicine, your blood pressure will go back up. You may take one or more types of medicine to lower your blood pressure. Be safe with medicines. Take your medicine exactly as prescribed. Call your doctor if you think you are having a problem with your medicine.  ?? Talk to your doctor before you start taking aspirin every day. Aspirin can help certain people lower their risk of a heart attack or stroke. But taking aspirin isn't right for everyone, because it can cause serious bleeding.  ?? See your doctor regularly. You may need to see the doctor more often at first or until your blood pressure comes down.  ?? If you are taking blood pressure medicine, talk to your doctor before  you take decongestants or anti-inflammatory medicine, such as ibuprofen. Some of these medicines can raise blood pressure.  ?? Learn how to check your blood pressure at home.  Lifestyle changes  ?? Stay at a healthy weight. This is especially important if you put on weight around the waist. Losing even 10 pounds can help you lower your blood pressure.  ?? If your doctor recommends it, get more exercise. Walking is a good choice. Bit by bit, increase the amount you walk every day. Try for at least 30 minutes on most days of the week. You also may want to swim, bike, or do other activities.  ?? Avoid or limit alcohol. Talk to your doctor about whether you can drink any alcohol.  ?? Try to limit how much sodium you eat to less than 2,300 milligrams (mg) a day. Your doctor may ask you to try to eat less than 1,500 mg a day.  ?? Eat plenty of fruits (such as bananas and oranges), vegetables, legumes, whole grains, and low-fat dairy products.  ?? Lower the amount of saturated fat in your diet. Saturated fat is found in animal products such as milk, cheese, and meat. Limiting these foods may help you lose weight and also lower your risk for heart disease.  ?? Do not smoke. Smoking increases your risk for heart attack and stroke. If you need help quitting, talk to your doctor about stop-smoking programs   and medicines. These can increase your chances of quitting for good.  When should you call for help?  Call 911 anytime you think you may need emergency care. This may mean having symptoms that suggest that your blood pressure is causing a serious heart or blood vessel problem. Your blood pressure may be over 180/110.  ?For example, call 911 if:  ? ?? You have symptoms of a heart attack. These may include:  ?? Chest pain or pressure, or a strange feeling in the chest.  ?? Sweating.  ?? Shortness of breath.  ?? Nausea or vomiting.  ?? Pain, pressure, or a strange feeling in the back, neck, jaw, or upper  belly or in one or both shoulders or arms.  ?? Lightheadedness or sudden weakness.  ?? A fast or irregular heartbeat.   ? ?? You have symptoms of a stroke. These may include:  ?? Sudden numbness, tingling, weakness, or loss of movement in your face, arm, or leg, especially on only one side of your body.  ?? Sudden vision changes.  ?? Sudden trouble speaking.  ?? Sudden confusion or trouble understanding simple statements.  ?? Sudden problems with walking or balance.  ?? A sudden, severe headache that is different from past headaches.   ? ?? You have severe back or belly pain.   ?Do not wait until your blood pressure comes down on its own. Get help right away.  ?Call your doctor now or seek immediate care if:  ? ?? Your blood pressure is much higher than normal (such as 180/110 or higher), but you don't have symptoms.   ? ?? You think high blood pressure is causing symptoms, such as:  ?? Severe headache.  ?? Blurry vision.   ?Watch closely for changes in your health, and be sure to contact your doctor if:  ? ?? Your blood pressure measures 140/90 or higher at least 2 times. That means the top number is 140 or higher or the bottom number is 90 or higher, or both.   ? ?? You think you may be having side effects from your blood pressure medicine.   ? ?? Your blood pressure is usually normal, but it goes above normal at least 2 times.   Where can you learn more?  Go to http://www.healthwise.net/[facility].  Enter X567 in the search box to learn more about "High Blood Pressure: Care Instructions."  Current as of: January 07, 2015  Content Version: 11.4  ?? 2006-2017 [employer]. Care instructions adapted under license by [employer] (which disclaims liability or warranty for this information). If you have questions about a medical condition or this instruction, always ask your healthcare professional. Healthwise, Incorporated disclaims any warranty or liability for your use of this information.
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
## 2016-07-02 LAB — CBC WITH AUTOMATED DIFF
ABS. BASOPHILS: 0.1 10*3/uL (ref 0.0–0.2)
ABS. EOSINOPHILS: 0.1 10*3/uL (ref 0.0–0.4)
ABS. IMM. GRANS.: 0 10*3/uL (ref 0.0–0.1)
ABS. MONOCYTES: 0.5 10*3/uL (ref 0.1–0.9)
ABS. NEUTROPHILS: 5.3 10*3/uL (ref 1.4–7.0)
Abs Lymphocytes: 2.5 10*3/uL (ref 0.7–3.1)
BASOPHILS: 1 %
EOSINOPHILS: 1 %
HCT: 35.5 % (ref 34.0–46.6)
HGB: 11.1 g/dL (ref 11.1–15.9)
IMMATURE GRANULOCYTES: 0 %
Lymphocytes: 30 %
MCH: 22.6 pg — ABNORMAL LOW (ref 26.6–33.0)
MCHC: 31.3 g/dL — ABNORMAL LOW (ref 31.5–35.7)
MCV: 72 fL — ABNORMAL LOW (ref 79–97)
MONOCYTES: 6 %
NEUTROPHILS: 62 %
PLATELET: 439 10*3/uL — ABNORMAL HIGH (ref 150–379)
RBC: 4.91 x10E6/uL (ref 3.77–5.28)
RDW: 24.6 % — ABNORMAL HIGH (ref 12.3–15.4)
WBC: 8.4 10*3/uL (ref 3.4–10.8)

## 2016-07-02 LAB — METABOLIC PANEL, BASIC
BUN/Creatinine ratio: 18 (ref 9–23)
BUN: 13 mg/dL (ref 6–20)
CO2: 23 mmol/L (ref 18–29)
Calcium: 9.5 mg/dL (ref 8.7–10.2)
Chloride: 97 mmol/L (ref 96–106)
Creatinine: 0.72 mg/dL (ref 0.57–1.00)
GFR est AA: 124 mL/min/{1.73_m2} (ref >59)
GFR est non-AA: 107 mL/min/{1.73_m2} (ref >59)
Glucose: 344 mg/dL — ABNORMAL HIGH (ref 65–99)
Potassium: 4.4 mmol/L (ref 3.5–5.2)
Sodium: 137 mmol/L (ref 134–144)

## 2016-07-02 LAB — HEMOGLOBIN A1C WITH EAG
Estimated average glucose: 229 mg/dL
Hemoglobin A1c: 9.6 % — ABNORMAL HIGH (ref 4.8–5.6)

## 2016-07-02 NOTE — Progress Notes (Signed)
Message sent.

## 2016-07-12 MED ORDER — FLUCONAZOLE 150 MG TAB
150 mg | ORAL_TABLET | Freq: Every day | ORAL | 0 refills | Status: AC
Start: 2016-07-12 — End: 2016-07-13

## 2016-07-12 NOTE — Telephone Encounter (Signed)
Pt notified.

## 2016-07-12 NOTE — Telephone Encounter (Signed)
Pt c/o vaginal itching, discomfort and blood when wiping d/t medication. Pt would like medication for yeast infection sent to pharmacy. Pt has tried OTC monistat which is not helping.

## 2016-07-12 NOTE — Telephone Encounter (Signed)
Diflucan sent in

## 2016-07-19 ENCOUNTER — Encounter

## 2016-07-20 MED ORDER — FAMOTIDINE 40 MG TAB
40 mg | ORAL_TABLET | ORAL | 4 refills | Status: DC
Start: 2016-07-20 — End: 2016-11-24

## 2016-07-26 ENCOUNTER — Encounter

## 2016-07-27 MED ORDER — IBUPROFEN 600 MG TAB
600 mg | ORAL_TABLET | ORAL | 1 refills | Status: DC
Start: 2016-07-27 — End: 2016-09-17

## 2016-07-28 ENCOUNTER — Encounter

## 2016-07-28 MED ORDER — FLUTICASONE FUROATE 200 MCG/ACTUATION BREATH ACTIVATED INHALER
200 mcg/actuation | Freq: Every day | RESPIRATORY_TRACT | 5 refills | Status: DC
Start: 2016-07-28 — End: 2017-03-07

## 2016-07-28 NOTE — Telephone Encounter (Signed)
PA for Alyssa [name] has been denied. Pt must try and fail one oral inhaled corticosteroid agent such as Aerospan or Arnuity Ellipta.

## 2016-07-28 NOTE — Telephone Encounter (Signed)
Called in arnuity. This should be used to control breathing not used PRN but daily.

## 2016-07-29 MED ORDER — GABAPENTIN 300 MG CAP
300 mg | ORAL_CAPSULE | ORAL | 1 refills | Status: DC
Start: 2016-07-29 — End: 2016-09-17

## 2016-07-29 NOTE — Telephone Encounter (Signed)
Notified Pt as per provider and she verbalized understanding.

## 2016-08-03 ENCOUNTER — Inpatient Hospital Stay
Admit: 2016-08-03 | Discharge: 2016-08-03 | Disposition: A | Discharge status: Home / Self Care | Payer: MEDICAID | Attending: Emergency Medicine

## 2016-08-03 DIAGNOSIS — E1165 Type 2 diabetes mellitus with hyperglycemia: Secondary | ICD-10-CM

## 2016-08-03 LAB — URINALYSIS W/ REFLEX CULTURE
Bacteria: NEGATIVE /hpf
Bilirubin: NEGATIVE
Blood: NEGATIVE
Glucose: >1000 mg/dL — AB
Leukocyte Esterase: NEGATIVE
Nitrites: NEGATIVE
Protein: NEGATIVE mg/dL
Specific gravity: 1.02 (ref 1.003–1.030)
Urobilinogen: 0.2 EU/dL (ref 0.2–1.0)
pH (UA): 5 (ref 5.0–8.0)

## 2016-08-03 LAB — METABOLIC PANEL, COMPREHENSIVE
A-G Ratio: 0.8 — ABNORMAL LOW (ref 1.1–2.2)
ALT (SGPT): 16 U/L (ref 12–78)
AST (SGOT): 9 U/L — ABNORMAL LOW (ref 15–37)
Albumin: 3.4 g/dL — ABNORMAL LOW (ref 3.5–5.0)
Alk. phosphatase: 67 U/L (ref 45–117)
Anion gap: 15 mmol/L (ref 5–15)
BUN/Creatinine ratio: 16 (ref 12–20)
BUN: 14 MG/DL (ref 6–20)
Bilirubin, total: 0.3 MG/DL (ref 0.2–1.0)
CO2: 22 mmol/L (ref 21–32)
Calcium: 8.8 MG/DL (ref 8.5–10.1)
Chloride: 100 mmol/L (ref 97–108)
Creatinine: 0.9 MG/DL (ref 0.55–1.02)
GFR est AA: >60 mL/min/{1.73_m2} (ref >60)
GFR est non-AA: >60 mL/min/{1.73_m2} (ref >60)
Globulin: 4.5 g/dL — ABNORMAL HIGH (ref 2.0–4.0)
Glucose: 215 mg/dL — ABNORMAL HIGH (ref 65–100)
Potassium: 3.8 mmol/L (ref 3.5–5.1)
Protein, total: 7.9 g/dL (ref 6.4–8.2)
Sodium: 137 mmol/L (ref 136–145)

## 2016-08-03 LAB — CBC WITH AUTOMATED DIFF
ABS. BASOPHILS: 0 10*3/uL (ref 0.0–0.1)
ABS. EOSINOPHILS: 0.2 10*3/uL (ref 0.0–0.4)
ABS. IMM. GRANS.: 0.1 10*3/uL — ABNORMAL HIGH (ref 0.00–0.04)
ABS. LYMPHOCYTES: 2.7 10*3/uL (ref 0.8–3.5)
ABS. MONOCYTES: 0.5 10*3/uL (ref 0.0–1.0)
ABS. NEUTROPHILS: 5.6 10*3/uL (ref 1.8–8.0)
ABSOLUTE NRBC: 0 10*3/uL (ref 0.00–0.01)
BASOPHILS: 0 % (ref 0–1)
EOSINOPHILS: 2 % (ref 0–7)
HCT: 36.2 % (ref 35.0–47.0)
HGB: 11.2 g/dL — ABNORMAL LOW (ref 11.5–16.0)
IMMATURE GRANULOCYTES: 1 % — ABNORMAL HIGH (ref 0.0–0.5)
LYMPHOCYTES: 30 % (ref 12–49)
MCH: 23.4 PG — ABNORMAL LOW (ref 26.0–34.0)
MCHC: 30.9 g/dL (ref 30.0–36.5)
MCV: 75.7 FL — ABNORMAL LOW (ref 80.0–99.0)
MONOCYTES: 6 % (ref 5–13)
MPV: 11 FL (ref 8.9–12.9)
NEUTROPHILS: 61 % (ref 32–75)
NRBC: 0 PER 100 WBC
PLATELET: 434 10*3/uL — ABNORMAL HIGH (ref 150–400)
RBC: 4.78 M/uL (ref 3.80–5.20)
RDW: 23.4 % — ABNORMAL HIGH (ref 11.5–14.5)
WBC: 9.1 10*3/uL (ref 3.6–11.0)

## 2016-08-03 LAB — WET PREP
Clue cells: ABSENT
Wet prep: NONE SEEN

## 2016-08-03 LAB — GLUCOSE, POC: Glucose (POC): 172 mg/dL — ABNORMAL HIGH (ref 65–100)

## 2016-08-03 LAB — KOH, OTHER SOURCES

## 2016-08-03 MED ORDER — FLUCONAZOLE 150 MG TAB
150 mg | ORAL_TABLET | ORAL | 0 refills | Status: AC
Start: 2016-08-03 — End: 2016-08-03

## 2016-08-03 NOTE — ED Notes (Signed)
Pelvic exam set-up at bedside per MD instructions. Patient resting in bed in no apparent distress. Call bell in reach, bed in low locked position. Registration at bedside presently.

## 2016-08-03 NOTE — ED Notes (Signed)
The patient was given discharge instructions and questions were answered. Patient is in no apparent distress at time of discharge. Ambulatory with steady gait.
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
## 2016-08-03 NOTE — ED Triage Notes (Signed)
Patient states that her blood sugars have been running in the 300s despite using diabetic medications as directed, vaginal itching and pain x 1.5 weeks, concerned she may have a yeast infection.

## 2016-08-05 LAB — CHLAMYDIA/GC PCR
Chlamydia amplified: NEGATIVE
N. gonorrhea, amplified: NEGATIVE

## 2016-08-11 ENCOUNTER — Inpatient Hospital Stay: Admit: 2016-08-11 | Payer: MEDICAID | Attending: Internal Medicine | Primary: Family Medicine

## 2016-08-11 ENCOUNTER — Ambulatory Visit
Admit: 2016-08-11 | Discharge: 2016-08-11 | Payer: PRIVATE HEALTH INSURANCE | Attending: Internal Medicine | Primary: Family Medicine

## 2016-08-11 DIAGNOSIS — K529 Noninfective gastroenteritis and colitis, unspecified: Secondary | ICD-10-CM

## 2016-08-11 MED ORDER — ONDANSETRON 8 MG TAB, RAPID DISSOLVE
8 mg | ORAL_TABLET | Freq: Three times a day (TID) | ORAL | 1 refills | Status: DC | PRN
Start: 2016-08-11 — End: 2016-10-27

## 2016-08-11 MED ORDER — ATORVASTATIN 10 MG TAB
10 mg | ORAL_TABLET | Freq: Every day | ORAL | 1 refills | Status: DC
Start: 2016-08-11 — End: 2017-01-07

## 2016-08-11 NOTE — Progress Notes (Signed)
HISTORY OF PRESENT ILLNESS  Alyssa [name] is a 36 y.o. female.  HPI   Patient reports nausea, diarrhea, and vomiting. She states she vomited once, but constantly feels nauseous. Patient has not been eating as much. Patient had taken nausea medications and was able to sleep. She woke up and still felt nauseous. Patient states diarrhea is improving; 2 days ago stool was liquid, but is now soft and more formed. Denies fever, chills, body aches. Admits to abdominal pain. Patient states hernia hurt more after having uterus removed.  Diabetic Review of Systems - medication compliance: compliant all of the time, diabetic diet compliance: compliant most of the time, further diabetic ROS: no polyuria or polydipsia, no chest pain, dyspnea or TIA's, no numbness, tingling or pain in extremities.  Other symptoms and concerns: Patient continues [place] and Jardiance. She states sugars are stable.  Hypertension ROS: taking medications as instructed, no medication side effects noted, no TIA's, no chest pain on exertion, no dyspnea on exertion, no swelling of ankles.   New concerns:  Patient's BP in office today is 130/71.   Hyperlipidemia:  Cardiovascular risk analysis - 36 y.o. female LDL goal is under 130.   ROS: taking medications as instructed, no medication side effects noted, no TIA's, no chest pain on exertion, no dyspnea on exertion, no swelling of ankles. Tolerating meds, no myalgias or other side effects noted  New concerns: Last LDL was 51 on 02/03/16. She continues atorvastatin 10 mg.    Review of Systems   All other systems reviewed and are negative.      Physical Exam   Constitutional: She is oriented to person, place, and time. She appears well-developed and well-nourished.   HENT:   Head: Normocephalic and atraumatic.   Right Ear: External ear normal.   Left Ear: External ear normal.   Nose: Nose normal.   Mouth/Throat: Oropharynx is clear and moist.   Eyes: Conjunctivae and EOM are normal.    Neck: Normal range of motion. Neck supple. Carotid bruit is not present. No thyroid mass and no thyromegaly present.   Cardiovascular: Normal rate, regular rhythm, S1 normal, S2 normal, normal heart sounds and intact distal pulses.    Pulmonary/Chest: Effort normal and breath sounds normal.   Abdominal: Soft. Normal appearance and bowel sounds are normal. There is no hepatosplenomegaly. There is no tenderness.   Musculoskeletal: Normal range of motion.   Neurological: She is alert and oriented to person, place, and time. She has normal strength. No cranial nerve deficit or sensory deficit. Coordination normal.   Skin: Skin is warm, dry and intact. No abrasion and no rash noted.   Psychiatric: She has a normal mood and affect. Her behavior is normal. Judgment and thought content normal.   Nursing note and vitals reviewed.      ASSESSMENT and PLAN  Diagnoses and all orders for this visit:    1. Gastroenteritis  Suspicious hernia contributing to nausea and pain, ordered XR. Patient will follow up with ER if pain worsens. She will treat nausea with Zofran.   -     XR ABD ([facility]); Future    2. Hyperlipidemia, unspecified hyperlipidemia type  Stable, patient tolerating meds, no myalgias. I do not recommend any change in medications.    3. Type 2 diabetes mellitus with complication, without long-term current use of insulin (HCC)  Sugars stable. Continue to follow diabetic diet and monitor sugars.     4. Essential hypertension  BP is at goal. I do not  recommend any change in medications.    Other orders  -     atorvastatin (LIPITOR) 10 mg tablet; Take 1 Tab by mouth daily.  -     ondansetron (ZOFRAN ODT) 8 mg disintegrating tablet; Take 1 Tab by mouth every eight (8) hours as needed for Nausea.    lab results and schedule of future lab studies reviewed with patient  reviewed diet, exercise and weight control  This note will not be viewable in MyChart.         Written by Wallie Renshaw, ScribeKick, as dictated by Girtha Hake, MD.     Current diagnosis and concerns discussed with pt at length. Understands risks and benefits or current treatment plan and medications and accepts the treatment and medication with any possible risks. Pt asks appropriate questions which were answered. Pt instructed to call with any concerns or problems.

## 2016-09-08 MED ORDER — LORATADINE 10 MG TAB
10 mg | ORAL_TABLET | ORAL | 4 refills | Status: DC
Start: 2016-09-08 — End: 2016-10-12

## 2016-09-10 ENCOUNTER — Encounter

## 2016-09-10 ENCOUNTER — Inpatient Hospital Stay: Admit: 2016-09-10 | Payer: MEDICAID | Primary: Family Medicine

## 2016-09-10 DIAGNOSIS — K598 Other specified functional intestinal disorders: Secondary | ICD-10-CM

## 2016-09-17 ENCOUNTER — Encounter

## 2016-09-18 MED ORDER — IBUPROFEN 600 MG TAB
600 mg | ORAL_TABLET | ORAL | 1 refills | Status: DC
Start: 2016-09-18 — End: 2016-11-05

## 2016-09-18 MED ORDER — GABAPENTIN 300 MG CAP
300 mg | ORAL_CAPSULE | ORAL | 1 refills | Status: DC
Start: 2016-09-18 — End: 2016-11-05

## 2016-09-29 ENCOUNTER — Encounter: Attending: Surgery | Primary: Family Medicine

## 2016-09-29 NOTE — Progress Notes (Signed)
Received office notes and path report from [facility] to Dr. Dema Severin for review.

## 2016-09-29 NOTE — Progress Notes (Signed)
Spoke to [facility] with Dr. Milas Kocher office to request office notes and path reports.

## 2016-10-04 ENCOUNTER — Encounter

## 2016-10-04 MED ORDER — JANUMET 50 MG-1,000 MG TABLET
50-1000 mg | ORAL_TABLET | ORAL | 3 refills | Status: DC
Start: 2016-10-04 — End: 2016-11-03

## 2016-10-04 MED ORDER — LISINOPRIL 10 MG TAB
10 mg | ORAL_TABLET | ORAL | 3 refills | Status: DC
Start: 2016-10-04 — End: 2017-03-07

## 2016-10-04 MED ORDER — GLIPIZIDE 10 MG TAB
10 mg | ORAL_TABLET | ORAL | 3 refills | Status: DC
Start: 2016-10-04 — End: 2016-11-03

## 2016-10-06 ENCOUNTER — Ambulatory Visit
Admit: 2016-10-06 | Discharge: 2016-10-06 | Payer: PRIVATE HEALTH INSURANCE | Attending: Surgery | Primary: Family Medicine

## 2016-10-06 DIAGNOSIS — K432 Incisional hernia without obstruction or gangrene: Secondary | ICD-10-CM

## 2016-10-06 NOTE — Progress Notes (Signed)
1. Have you been to the ER, urgent care clinic since your last visit?  Hospitalized since your last visit?No    2. Have you seen or consulted any other health care providers outside of the [facility] since your last visit?  Include any pap smears or colon screening. Yes, Dr. Kasandra Knudsen
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
## 2016-10-07 DIAGNOSIS — K432 Incisional hernia without obstruction or gangrene: Secondary | ICD-10-CM

## 2016-10-12 ENCOUNTER — Ambulatory Visit
Admit: 2016-10-12 | Discharge: 2016-10-12 | Payer: PRIVATE HEALTH INSURANCE | Attending: Internal Medicine | Primary: Family Medicine

## 2016-10-12 DIAGNOSIS — E118 Type 2 diabetes mellitus with unspecified complications: Secondary | ICD-10-CM

## 2016-10-12 MED ORDER — BLOOD SUGAR DIAGNOSTIC TEST STRIPS
ORAL_STRIP | 11 refills | Status: DC
Start: 2016-10-12 — End: 2016-10-13

## 2016-10-12 MED ORDER — LORATADINE 10 MG TAB
10 mg | ORAL_TABLET | ORAL | 11 refills | Status: DC
Start: 2016-10-12 — End: 2016-11-24

## 2016-10-12 MED ORDER — BLOOD SUGAR DIAGNOSTIC TEST STRIPS
ORAL_STRIP | 11 refills | Status: DC
Start: 2016-10-12 — End: 2016-10-12

## 2016-10-12 MED ORDER — LANCETS
11 refills | Status: DC
Start: 2016-10-12 — End: 2016-11-24
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
## 2016-10-13 MED ORDER — BLOOD SUGAR DIAGNOSTIC TEST STRIPS
ORAL_STRIP | 11 refills | Status: DC
Start: 2016-10-13 — End: 2016-11-24

## 2016-10-18 LAB — METABOLIC PANEL, BASIC
BUN/Creatinine ratio: 12 (ref 9–23)
BUN: 8 mg/dL (ref 6–20)
CO2: 24 mmol/L (ref 20–29)
Calcium: 9.6 mg/dL (ref 8.7–10.2)
Chloride: 96 mmol/L (ref 96–106)
Creatinine: 0.65 mg/dL (ref 0.57–1.00)
GFR est AA: 131 mL/min/{1.73_m2} (ref >59)
GFR est non-AA: 114 mL/min/{1.73_m2} (ref >59)
Glucose: 321 mg/dL — ABNORMAL HIGH (ref 65–99)
Potassium: 4.6 mmol/L (ref 3.5–5.2)
Sodium: 137 mmol/L (ref 134–144)

## 2016-10-18 LAB — HEMOGLOBIN A1C WITH EAG
Estimated average glucose: 235 mg/dL
Hemoglobin A1c: 9.8 % — ABNORMAL HIGH (ref 4.8–5.6)

## 2016-10-19 NOTE — Progress Notes (Signed)
Message sent. Can you please reach out to her about her DM. Compliance is part of her problem!!

## 2016-10-27 ENCOUNTER — Emergency Department

## 2016-10-27 ENCOUNTER — Emergency Department: Admit: 2016-10-27 | Payer: MEDICAID | Primary: Family Medicine

## 2016-10-27 ENCOUNTER — Inpatient Hospital Stay
Admit: 2016-10-27 | Discharge: 2016-10-27 | Disposition: A | Discharge status: Home / Self Care | Payer: MEDICAID | Attending: Emergency Medicine

## 2016-10-27 ENCOUNTER — Encounter: Primary: Family Medicine

## 2016-10-27 DIAGNOSIS — N83202 Unspecified ovarian cyst, left side: Secondary | ICD-10-CM

## 2016-10-27 LAB — URINALYSIS W/ REFLEX CULTURE
Bacteria: NEGATIVE /hpf
Bilirubin: NEGATIVE
Blood: NEGATIVE
Glucose: >1000 mg/dL — AB
Ketone: NEGATIVE mg/dL
Leukocyte Esterase: NEGATIVE
Nitrites: NEGATIVE
Protein: NEGATIVE mg/dL
Specific gravity: 1.005 (ref 1.003–1.030)
Urobilinogen: 0.2 EU/dL (ref 0.2–1.0)
pH (UA): 6.5 (ref 5.0–8.0)

## 2016-10-27 LAB — METABOLIC PANEL, COMPREHENSIVE
A-G Ratio: 0.7 — ABNORMAL LOW (ref 1.1–2.2)
ALT (SGPT): 18 U/L (ref 12–78)
AST (SGOT): 13 U/L — ABNORMAL LOW (ref 15–37)
Albumin: 3.3 g/dL — ABNORMAL LOW (ref 3.5–5.0)
Alk. phosphatase: 65 U/L (ref 45–117)
Anion gap: 9 mmol/L (ref 5–15)
BUN/Creatinine ratio: 10 — ABNORMAL LOW (ref 12–20)
BUN: 10 MG/DL (ref 6–20)
Bilirubin, total: 0.6 MG/DL (ref 0.2–1.0)
CO2: 28 mmol/L (ref 21–32)
Calcium: 9.2 MG/DL (ref 8.5–10.1)
Chloride: 98 mmol/L (ref 97–108)
Creatinine: 0.97 MG/DL (ref 0.55–1.02)
GFR est AA: >60 mL/min/{1.73_m2} (ref >60)
GFR est non-AA: >60 mL/min/{1.73_m2} (ref >60)
Globulin: 4.6 g/dL — ABNORMAL HIGH (ref 2.0–4.0)
Glucose: 342 mg/dL — ABNORMAL HIGH (ref 65–100)
Potassium: 4 mmol/L (ref 3.5–5.1)
Protein, total: 7.9 g/dL (ref 6.4–8.2)
Sodium: 135 mmol/L — ABNORMAL LOW (ref 136–145)

## 2016-10-27 LAB — CBC WITH AUTOMATED DIFF
ABS. BASOPHILS: 0.1 10*3/uL (ref 0.0–0.1)
ABS. EOSINOPHILS: 0.1 10*3/uL (ref 0.0–0.4)
ABS. IMM. GRANS.: 0.1 10*3/uL — ABNORMAL HIGH (ref 0.00–0.04)
ABS. LYMPHOCYTES: 2.2 10*3/uL (ref 0.8–3.5)
ABS. MONOCYTES: 0.6 10*3/uL (ref 0.0–1.0)
ABS. NEUTROPHILS: 6.3 10*3/uL (ref 1.8–8.0)
ABSOLUTE NRBC: 0 10*3/uL (ref 0.00–0.01)
BASOPHILS: 1 % (ref 0–1)
EOSINOPHILS: 1 % (ref 0–7)
HCT: 38.7 % (ref 35.0–47.0)
HGB: 12.1 g/dL (ref 11.5–16.0)
IMMATURE GRANULOCYTES: 1 % — ABNORMAL HIGH (ref 0.0–0.5)
LYMPHOCYTES: 24 % (ref 12–49)
MCH: 24.9 PG — ABNORMAL LOW (ref 26.0–34.0)
MCHC: 31.3 g/dL (ref 30.0–36.5)
MCV: 79.8 FL — ABNORMAL LOW (ref 80.0–99.0)
MONOCYTES: 7 % (ref 5–13)
MPV: 11.5 FL (ref 8.9–12.9)
NEUTROPHILS: 67 % (ref 32–75)
NRBC: 0 PER 100 WBC
PLATELET: 380 10*3/uL (ref 150–400)
RBC: 4.85 M/uL (ref 3.80–5.20)
RDW: 16.9 % — ABNORMAL HIGH (ref 11.5–14.5)
WBC: 9.3 10*3/uL (ref 3.6–11.0)

## 2016-10-27 LAB — GLUCOSE, POC
Glucose (POC): 357 mg/dL — ABNORMAL HIGH (ref 65–100)
Glucose (POC): 208 mg/dL — ABNORMAL HIGH (ref 65–100)

## 2016-10-27 LAB — LIPASE: Lipase: 201 U/L (ref 73–393)

## 2016-10-27 LAB — HCG URINE, QL. - POC: Pregnancy test,urine (POC): NEGATIVE

## 2016-10-27 MED ORDER — HYDROCODONE-ACETAMINOPHEN 5 MG-325 MG TAB
5-325 mg | ORAL_TABLET | ORAL | 0 refills | Status: DC | PRN
Start: 2016-10-27 — End: 2016-11-02

## 2016-10-27 MED ORDER — IOPAMIDOL 76 % IV SOLN
370 mg iodine /mL (76 %) | Freq: Once | INTRAVENOUS | Status: AC
Start: 2016-10-27 — End: 2016-10-27
  Administered 2016-10-27: 17:00:00 via INTRAVENOUS

## 2016-10-27 MED ORDER — INSULIN REGULAR HUMAN 100 UNIT/ML INJECTION
100 unit/mL | INTRAMUSCULAR | Status: AC
Start: 2016-10-27 — End: 2016-10-27
  Administered 2016-10-27: 17:00:00 via INTRAVENOUS

## 2016-10-27 MED ORDER — SODIUM CHLORIDE 0.9% BOLUS IV
0.9 % | Freq: Once | INTRAVENOUS | Status: AC
Start: 2016-10-27 — End: 2016-10-27
  Administered 2016-10-27: 16:00:00 via INTRAVENOUS

## 2016-10-27 MED ORDER — ONDANSETRON 4 MG TAB, RAPID DISSOLVE
4 mg | ORAL_TABLET | Freq: Three times a day (TID) | ORAL | 0 refills | Status: DC | PRN
Start: 2016-10-27 — End: 2016-11-27

## 2016-10-27 MED ORDER — ONDANSETRON (PF) 4 MG/2 ML INJECTION
4 mg/2 mL | INTRAMUSCULAR | Status: AC
Start: 2016-10-27 — End: 2016-10-27
  Administered 2016-10-27: 16:00:00 via INTRAVENOUS

## 2016-10-27 MED ORDER — KETOROLAC TROMETHAMINE 30 MG/ML INJECTION
30 mg/mL (1 mL) | INTRAMUSCULAR | Status: AC
Start: 2016-10-27 — End: 2016-10-27
  Administered 2016-10-27: 16:00:00 via INTRAVENOUS

## 2016-10-27 MED FILL — ONDANSETRON (PF) 4 MG/2 ML INJECTION: 4 mg/2 mL | INTRAMUSCULAR | Qty: 2

## 2016-10-27 MED FILL — SODIUM CHLORIDE 0.9 % IV: INTRAVENOUS | Qty: 1000

## 2016-10-27 MED FILL — ISOVUE-370  76 % INTRAVENOUS SOLUTION: 370 mg iodine /mL (76 %) | INTRAVENOUS | Qty: 100

## 2016-10-27 MED FILL — INSULIN REGULAR HUMAN 100 UNIT/ML INJECTION: 100 unit/mL | INTRAMUSCULAR | Qty: 1

## 2016-10-27 MED FILL — KETOROLAC TROMETHAMINE 30 MG/ML INJECTION: 30 mg/mL (1 mL) | INTRAMUSCULAR | Qty: 1

## 2016-10-27 NOTE — Telephone Encounter (Signed)
-----   Message from Azzie Roup sent at 10/27/2016  9:46 AM EDT -----  Regarding: NP [place]/Telephone  Need to reschedule appt from 10/27/16. Best contact [phone]
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
## 2016-10-27 NOTE — ED Triage Notes (Signed)
Patient presents with left side pain since yesterday and BG of 321 this morning. Reports increased thirst.

## 2016-10-31 ENCOUNTER — Encounter: Attending: Obstetrics & Gynecology | Primary: Family Medicine

## 2016-11-02 ENCOUNTER — Inpatient Hospital Stay: Admit: 2016-11-02 | Payer: MEDICAID | Primary: Family Medicine

## 2016-11-02 DIAGNOSIS — Z01818 Encounter for other preprocedural examination: Secondary | ICD-10-CM

## 2016-11-02 LAB — CBC W/O DIFF
ABSOLUTE NRBC: 0 10*3/uL (ref 0.00–0.01)
HCT: 38.4 % (ref 35.0–47.0)
HGB: 12.2 g/dL (ref 11.5–16.0)
MCH: 25.3 PG — ABNORMAL LOW (ref 26.0–34.0)
MCHC: 31.8 g/dL (ref 30.0–36.5)
MCV: 79.7 FL — ABNORMAL LOW (ref 80.0–99.0)
MPV: 12.7 FL (ref 8.9–12.9)
NRBC: 0 PER 100 WBC
PLATELET: 390 10*3/uL (ref 150–400)
RBC: 4.82 M/uL (ref 3.80–5.20)
RDW: 16.3 % — ABNORMAL HIGH (ref 11.5–14.5)
WBC: 8.6 10*3/uL (ref 3.6–11.0)

## 2016-11-02 LAB — METABOLIC PANEL, BASIC
Anion gap: 9 mmol/L (ref 5–15)
BUN/Creatinine ratio: 12 (ref 12–20)
BUN: 9 MG/DL (ref 6–20)
CO2: 26 mmol/L (ref 21–32)
Calcium: 9.2 MG/DL (ref 8.5–10.1)
Chloride: 99 mmol/L (ref 97–108)
Creatinine: 0.78 MG/DL (ref 0.55–1.02)
GFR est AA: >60 mL/min/{1.73_m2} (ref >60)
GFR est non-AA: >60 mL/min/{1.73_m2} (ref >60)
Glucose: 377 mg/dL — ABNORMAL HIGH (ref 65–100)
Potassium: 4.3 mmol/L (ref 3.5–5.1)
Sodium: 134 mmol/L — ABNORMAL LOW (ref 136–145)

## 2016-11-02 LAB — PTT: aPTT: 30.7 s (ref 22.1–32.0)

## 2016-11-02 LAB — PROTHROMBIN TIME + INR
INR: 0.9 (ref 0.9–1.1)
Prothrombin time: 9.7 s (ref 9.0–11.1)

## 2016-11-02 NOTE — Other (Addendum)
PATIENT GIVEN SURGICAL SITE INFECTION FAQ HANDOUT AND HAND WASHING TIP SHEET. PREOP INSTRUCTIONS REVIEWED AND PATIENT VERBALIZES UNDERSTANDING OF INSTRUCTIONS. PATIENT HAS BEEN GIVEN THE OPPORTUNITY TO ASK ADDITIONAL QUESTIONS.    GAVE PATIENT 2 PACKAGES OF 6 CHG WIPES AND INSTRUCTIONS. PATIENT IS SLEEPY AS WORKED ALL LAST NIGHT IN [facility]. REINFORCED SHE RE READ INSTRUCTIONS CAREFULLY.     PATIENT HAS PCP APPT TOMORROW FOR BOIL THAT BURST TODAY ACCORDING TO PATIENT ON HER BUTTOCKS.     SENT PATIENT TO [facility] FOR PREOP CHEST XRAY.

## 2016-11-03 ENCOUNTER — Ambulatory Visit
Admit: 2016-11-03 | Discharge: 2016-11-03 | Payer: PRIVATE HEALTH INSURANCE | Attending: Internal Medicine | Primary: Family Medicine

## 2016-11-03 ENCOUNTER — Ambulatory Visit: Admit: 2016-11-03 | Discharge: 2016-11-03 | Payer: PRIVATE HEALTH INSURANCE | Primary: Family Medicine

## 2016-11-03 DIAGNOSIS — E118 Type 2 diabetes mellitus with unspecified complications: Secondary | ICD-10-CM

## 2016-11-03 DIAGNOSIS — L0291 Cutaneous abscess, unspecified: Secondary | ICD-10-CM

## 2016-11-03 MED ORDER — DOXYCYCLINE 100 MG TAB
100 mg | ORAL_TABLET | Freq: Two times a day (BID) | ORAL | 0 refills | Status: AC
Start: 2016-11-03 — End: 2016-11-13

## 2016-11-03 MED ORDER — SITAGLIPTIN PHOS 50 MG-METFORMIN ER 1,000 MG TABLET,EXTEND REL 24H MP
50-1000 mg | ORAL_TABLET | Freq: Every day | ORAL | 3 refills | Status: DC
Start: 2016-11-03 — End: 2017-03-07

## 2016-11-03 MED ORDER — GLIPIZIDE SR 10 MG 24 HR TAB
10 mg | ORAL_TABLET | Freq: Every day | ORAL | 3 refills | Status: DC
Start: 2016-11-03 — End: 2017-09-01

## 2016-11-03 NOTE — Patient Instructions (Signed)
Skin Abscess: Care Instructions  Your Care Instructions    A skin abscess is a bacterial infection that forms a pocket of pus. A boil is a kind of skin abscess. The doctor may have cut an opening in the abscess so that the pus can drain out. You may have gauze in the cut so that the abscess will stay open and keep draining. You may need antibiotics. You will need to follow up with your doctor to make sure the infection has gone away.  The doctor has checked you carefully, but problems can develop later. If you notice any problems or new symptoms, get medical treatment right away.  Follow-up care is a key part of your treatment and safety. Be sure to make and go to all appointments, and call your doctor if you are having problems. It's also a good idea to know your test results and keep a list of the medicines you take.  How can you care for yourself at home?  ?? Apply warm and dry compresses, a heating pad set on low, or a hot water bottle 3 or 4 times a day for pain. Keep a cloth between the heat source and your skin.  ?? If your doctor prescribed antibiotics, take them as directed. Do not stop taking them just because you feel better. You need to take the full course of antibiotics.  ?? Take pain medicines exactly as directed.  ?? If the doctor gave you a prescription medicine for pain, take it as prescribed.  ?? If you are not taking a prescription pain medicine, ask your doctor if you can take an over-the-counter medicine.  ?? Keep your bandage clean and dry. Change the bandage whenever it gets wet or dirty, or at least one time a day.  ?? If the abscess was packed with gauze:  ?? Keep follow-up appointments to have the gauze changed or removed. If the doctor instructed you to remove the gauze, gently pull out all of the gauze when your doctor tells you to.  ?? After the gauze is removed, soak the area in warm water for 15 to 20 minutes 2 times a day, until the wound closes.  When should you call for help?   Call your doctor now or seek immediate medical care if:  ?? ?? You have signs of worsening infection, such as:  ?? Increased pain, swelling, warmth, or redness.  ?? Red streaks leading from the infected skin.  ?? Pus draining from the wound.  ?? A fever.   ??Watch closely for changes in your health, and be sure to contact your doctor if:  ?? ?? You do not get better as expected.   Where can you learn more?  Go to http://www.healthwise.net/[employer].  Enter D633 in the search box to learn more about "Skin Abscess: Care Instructions."  Current as of: Aug 26, 2015  Content Version: 11.7  ?? 2006-2018 [employer]. Care instructions adapted under license by [employer] (which disclaims liability or warranty for this information). If you have questions about a medical condition or this instruction, always ask your healthcare professional. Healthwise, Inc[employer] disclaims any warranty or liability for your use of this information.

## 2016-11-03 NOTE — Progress Notes (Signed)
CC:  Diabetes management/education    S/O: Alyssa [name] is a 36 y.o. female referred by Dr. [name], MD for diabetes management.       WJX:BJYN week met with her boss about leave after surgery and her blood sugar was 357 at ER last week. They gave her insulin and it came down to 208. Patients primarily concerned with this boil that popped today and is hoping to see Dr. Verdon Cummins to make sure that her surgery isn't going to be delayed. Patient not adherent to diabetes medications and states that she sometimes doesn't take the doses due to side effects (being sleepy).    Current Diabetes Regimen:  Glipizide 47m twice a day  -- makes her sleepy so on days when she works, only takes it at [employer] --twice a day every day  Jardiance  --only takes a night  Misses 4 doses/week of medicines    ROS:   Patient denies hypoglycemic and hyperglycemic signs/symptoms, chest pain, shortness of breath, neuropathy, vision changes, and any foot changes.    Self-Monitoring Blood Glucose:  Fasting - 189, 187  Doesn't check often    Diet/Exercise:  Not addressed today    Social:  Works during the night; but about to move back to day shift      Data reviewed:  Lab Results   Component Value Date/Time    Hemoglobin A1c 9.8 (H) 10/12/2016 08:41 AM         Diabetes Health Maintenance: address at future visit  Last eye exam:  Last foot exam:  Last influenza vaccine:  Last Pneumovax 23 vaccine:  Last Prevnar-13 vaccine:  Hepatitis B Series:   ASA Therapy:  ACE/ARB Therapy:  Statin Therapy:    Assessment/Plan:     1.  Diabetes:  a1c not at goal<7% and hard to optimize therapy as she is frequently missing doses of medications. Spent a long time discussing adherence and will change 2 of her medications to extended release formulations to hopefully increase adherence. Depending where blood sugars are running when I see her in 1 month, may need to change therapy (?d/c  Januvia, start GLP-1?). Advised patient to check blood sugars over the next few weeks so that we can see where they are running in order to make changes. Did discuss the complications of diabetes that is poorly controlled. Dr. RVerdon Cumminswill see her today to discuss boil.   Orders Placed This Encounter   ??? glipiZIDE SR (GLUCOTROL XL) 10 mg CR tablet     Sig: Take 1 Tab by mouth daily.     Dispense:  30 Tab     Refill:  3   ??? SITagliptin-metFORMIN (JANUMET XR) 50-1,000 mg TM24     Sig: Take 2 Tabs by mouth daily.     Dispense:  60 Tab     Refill:  3           Patient verbalized understanding of the information presented and all of the patient???s questions were answered.  AVS was handed to the patient and information reviewed.      Patient advised to call me or Dr. JLoralie Champagne MD with any additional questions or concerns.    Follow-up: 1 month  Notification of recommendations will be sent to Dr. JLoralie Champagne MD for review.      TGaynelle Cage PharmD, BCPS, CDE

## 2016-11-03 NOTE — Other (Signed)
MESSAGE SENT THROUGH [facility] TO [facility] WHITE'S NURSE WITH ABNORMAL BMP. BS 377.
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
## 2016-11-04 NOTE — Telephone Encounter (Signed)
Patient states the antibiotic she was given for her boil requires a PA , requesting we contact her pharmacy at [phone]

## 2016-11-05 ENCOUNTER — Encounter

## 2016-11-05 MED ORDER — IBUPROFEN 600 MG TAB
600 mg | ORAL_TABLET | ORAL | 1 refills | Status: DC
Start: 2016-11-05 — End: 2016-12-29

## 2016-11-05 MED ORDER — GABAPENTIN 300 MG CAP
300 mg | ORAL_CAPSULE | ORAL | 1 refills | Status: DC
Start: 2016-11-05 — End: 2016-11-24

## 2016-11-07 NOTE — Progress Notes (Signed)
PA for Doxycycline has been approved. Pharmacy has been notified.

## 2016-11-07 NOTE — Telephone Encounter (Signed)
Approved, Pt and pharmacy notified.

## 2016-11-08 NOTE — Telephone Encounter (Signed)
Called patient to discuss rescheduling surgery but she was unaware that it had been cancelled.  She thought that since she was on antibiotics she would still have her surgery.  She was surprised and I couldn't answer any of her questions so she'd like to speak to Dr. Dema Severin.

## 2016-11-09 NOTE — Telephone Encounter (Signed)
Pt is calling back about scheduling an office visit with Dr Dema Severin just let me know if you want me to schedule her to come in next week

## 2016-11-10 ENCOUNTER — Encounter: Attending: Surgery | Primary: Family Medicine

## 2016-11-10 ENCOUNTER — Ambulatory Visit: Admit: 2016-11-10 | Payer: PRIVATE HEALTH INSURANCE | Attending: Nurse Practitioner | Primary: Family Medicine

## 2016-11-10 ENCOUNTER — Encounter: Attending: Nurse Practitioner | Primary: Family Medicine

## 2016-11-10 DIAGNOSIS — R309 Painful micturition, unspecified: Secondary | ICD-10-CM

## 2016-11-10 LAB — AMB POC URINALYSIS DIP STICK AUTO W/O MICRO
Bilirubin (UA POC): NEGATIVE
Ketones (UA POC): NEGATIVE
Nitrites (UA POC): NEGATIVE
Protein (UA POC): NEGATIVE
Specific gravity (UA POC): 1.005 (ref 1.001–1.035)
Urobilinogen (UA POC): 0.2 (ref 0.2–1)
pH (UA POC): 5.5 (ref 4.6–8.0)

## 2016-11-10 MED ORDER — CLOTRIMAZOLE-BETAMETHASONE 1 %-0.05 % TOPICAL CREAM
Freq: Two times a day (BID) | CUTANEOUS | 0 refills | Status: DC
Start: 2016-11-10 — End: 2016-11-24

## 2016-11-10 MED ORDER — FLUCONAZOLE 150 MG TAB
150 mg | ORAL_TABLET | Freq: Every day | ORAL | 0 refills | Status: AC
Start: 2016-11-10 — End: 2016-11-11

## 2016-11-10 NOTE — Patient Instructions (Addendum)
Vaginal Yeast Infection: Care Instructions  Your Care Instructions    A vaginal yeast infection is caused by too many yeast cells in the vagina. This is common in women of all ages. Itching, vaginal discharge and irritation, and other symptoms can bother you. But yeast infections don't often cause other health problems.  Some medicines can increase your risk of getting a yeast infection. These include antibiotics, birth control pills, hormones, and steroids. You may also be more likely to get a yeast infection if you are pregnant, have diabetes, douche, or wear tight clothes.  With treatment, most yeast infections get better in 2 to 3 days.  Follow-up care is a key part of your treatment and safety. Be sure to make and go to all appointments, and call your doctor if you are having problems. It's also a good idea to know your test results and keep a list of the medicines you take.  How can you care for yourself at home?  ?? Take your medicines exactly as prescribed. Call your doctor if you think you are having a problem with your medicine.  ?? Ask your doctor about over-the-counter (OTC) medicines for yeast infections. They may cost less than prescription medicines. If you use an OTC treatment, read and follow all instructions on the label.  ?? Do not use tampons while using a vaginal cream or suppository. The tampons can absorb the medicine. Use pads instead.  ?? Wear loose cotton clothing. Do not wear nylon or other fabric that holds body heat and moisture close to the skin.  ?? Try sleeping without underwear.  ?? Do not scratch. Relieve itching with a cold pack or a cool bath.  ?? Do not wash your vaginal area more than once a day. Use plain water or a mild, unscented soap. Air-dry the vaginal area.  ?? Change out of wet swimsuits after swimming.  ?? Do not have sex until you have finished your treatment.  ?? Do not douche.  When should you call for help?  Call your doctor now or seek immediate medical care if:   ?? ?? You have unexpected vaginal bleeding.   ?? ?? You have new or increased pain in your vagina or pelvis.   ??Watch closely for changes in your health, and be sure to contact your doctor if:  ?? ?? You have a fever.   ?? ?? You are not getting better after 2 days.   ?? ?? Your symptoms come back after you finish your medicines.   Where can you learn more?  Go to http://www.healthwise.net/[facility].  Enter F639 in the search box to learn more about "Vaginal Yeast Infection: Care Instructions."  Current as of: January 22, 2016  Content Version: 11.7  ?? 2006-2018 [employer]. Care instructions adapted under license by [employer] (which disclaims liability or warranty for this information). If you have questions about a medical condition or this instruction, always ask your healthcare professional. [employer]wise[employer] disclaims any warranty or liability for your use of this information.

## 2016-11-10 NOTE — Progress Notes (Signed)
HISTORY OF PRESENT ILLNESS  Alyssa [name] is a 36 y.o. female.  HPI  Presents with complaints of severe vaginal itching for the past several days.  Was seen in office on 7/19 for labial abscess and was prescribed Doxycycline for infection. Was not able to start this until 2 days ago due to insurance issues.  Noted some vaginal itching beginning on 7/20 and this has significantly worsened over the past few days.  Notes swelling of vaginal labia but denies sores or ulcerations.  Has had thick clumpy discharge and notes vaginal odor as well.  Having some burning with voiding especially when urine comes into contact with vaginal tissues.  Used Monistat 1 day ovule yesterday and cream to external vaginal tissues without any relief.  Denies fever, chills, pelvic pain.    Review of Systems   Constitutional: Negative for chills, fever and malaise/fatigue.   Respiratory: Negative for cough.    Cardiovascular: Negative for chest pain and palpitations.   Gastrointestinal: Negative for abdominal pain, nausea and vomiting.   Genitourinary: Positive for dysuria. Negative for frequency.   Musculoskeletal: Negative for myalgias.   Skin: Positive for itching and rash.       Physical Exam   Constitutional: She is oriented to person, place, and time. She appears well-developed and well-nourished.   HENT:   Head: Normocephalic and atraumatic.   Cardiovascular: Normal rate and regular rhythm.    Pulmonary/Chest: Effort normal and breath sounds normal.   Abdominal: Soft. Bowel sounds are normal. There is no tenderness.   Genitourinary:       Cervix exhibits discharge. There is erythema in the vagina. Vaginal discharge found.   Genitourinary Comments: Thick white vaginal discharge with deep erythema and edema to labia minora and vaginal tissues.  Pustule to right perineal area healing without surrounding erythema   Neurological: She is alert and oriented to person, place, and time.   Skin: Skin is warm and dry.    Psychiatric: She has a normal mood and affect. Her behavior is normal.   Nursing note and vitals reviewed.      ASSESSMENT and PLAN  Diagnoses and all orders for this visit:    1. Urinary pain  -     AMB POC URINALYSIS DIP STICK AUTO W/O MICRO  -     CULTURE, URINE    2. Yeast vaginitis  -     fluconazole (DIFLUCAN) 150 mg tablet; Take 1 Tab by mouth daily for 1 day. Repeat in 4 days with 2nd tablet  -     clotrimazole-betamethasone (LOTRISONE) topical cream; Apply  to affected area two (2) times a day.  -     NUSWAB VAGINITIS PLUS    3. Vaginal pain  -     NUSWAB VAGINITIS PLUS      lab results and schedule of future lab studies reviewed with patient  reviewed diet, exercise and weight control  reviewed medications and side effects in detail

## 2016-11-12 ENCOUNTER — Encounter

## 2016-11-12 LAB — CULTURE, URINE

## 2016-11-12 MED ORDER — JARDIANCE 25 MG TABLET
25 mg | ORAL_TABLET | ORAL | 4 refills | Status: DC
Start: 2016-11-12 — End: 2017-03-07

## 2016-11-13 NOTE — Progress Notes (Signed)
Message sent

## 2016-11-14 ENCOUNTER — Encounter

## 2016-11-14 ENCOUNTER — Ambulatory Visit
Admit: 2016-11-14 | Discharge: 2016-11-14 | Payer: PRIVATE HEALTH INSURANCE | Attending: Obstetrics & Gynecology | Primary: Family Medicine

## 2016-11-14 ENCOUNTER — Encounter: Attending: Internal Medicine | Primary: Family Medicine

## 2016-11-14 DIAGNOSIS — N9089 Other specified noninflammatory disorders of vulva and perineum: Secondary | ICD-10-CM

## 2016-11-14 MED ORDER — VALACYCLOVIR 1 G TAB
1 gram | ORAL_TABLET | Freq: Two times a day (BID) | ORAL | 0 refills | Status: DC
Start: 2016-11-14 — End: 2018-07-26
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
## 2016-11-15 ENCOUNTER — Ambulatory Visit
Admit: 2016-11-15 | Discharge: 2016-11-15 | Payer: PRIVATE HEALTH INSURANCE | Attending: Surgery | Primary: Family Medicine

## 2016-11-15 DIAGNOSIS — K432 Incisional hernia without obstruction or gangrene: Secondary | ICD-10-CM

## 2016-11-15 NOTE — Progress Notes (Signed)
[facility]      Clinic Note - Follow up    Subjective     Alyssa [name] returns for follow up today.  She had her planned surgery for laparoscopic incisional ventral hernia repair delayed due to the development of an abscess at her right buttocks gluteal fold/labial region last week.  This drained spontaneously and she was started on antibiotics.  She subsequently has developed a yeast infection and was given Fluconazole for this but states her abscess has now resolved.  She is here to be examined prior to surgery.      Objective     Visit Vitals   ??? BP 140/82 (BP 1 Location: Left arm, BP Patient Position: Sitting)   ??? Pulse 98   ??? Temp 98.7 ??F (37.1 ??C) (Oral)   ??? Resp 22   ??? Ht 5\' 3"  (1.6 m)   ??? Wt 256 lb (116.1 kg)   ??? LMP 03/08/2016  Comment: has heavy periods per patient   ??? SpO2 97%   ??? BMI 45.35 kg/m2         PE  GEN - Awake, alert, communicating appropriately.  NAD  Pulm - CTAB  CV - RRR  Abd - soft, NT, ND.  Incisional hernia is easily reducible.    Ext - warm, well perfused.  Right buttocks abscess now resolved without signs of persistent infection.     Labs  None    Assessment     Alyssa [name] is a 36 y.o.yr old female with a ventral incisional hernia.      Plan     Plan for laparoscopic repair of her ventral incisional hernia with mesh in OR tomorrow.  A complete discussion of the risks, benefits and alternatives to surgery were discussed with the patient who was keen to proceed.       Hetty Blend, MD  11/15/16     CC: [name], MD

## 2016-11-15 NOTE — Progress Notes (Signed)
1. Have you been to the ER, urgent care clinic since your last visit?  Hospitalized since your last visit?No    2. Have you seen or consulted any other health care providers outside of the [facility] since your last visit?  Include any pap smears or colon screening. No

## 2016-11-16 ENCOUNTER — Inpatient Hospital Stay: Payer: MEDICAID

## 2016-11-16 LAB — GLUCOSE, POC
Glucose (POC): 264 mg/dL — ABNORMAL HIGH (ref 65–100)
Glucose (POC): 263 mg/dL — ABNORMAL HIGH (ref 65–100)

## 2016-11-16 LAB — HCG URINE, QL. - POC: Pregnancy test,urine (POC): NEGATIVE

## 2016-11-16 MED ORDER — MIDAZOLAM 1 MG/ML IJ SOLN
1 mg/mL | INTRAMUSCULAR | Status: AC
Start: 2016-11-16 — End: ?

## 2016-11-16 MED ORDER — LIDOCAINE (PF) 10 MG/ML (1 %) IJ SOLN
10 mg/mL (1 %) | INTRAMUSCULAR | Status: DC | PRN
Start: 2016-11-16 — End: 2016-11-16

## 2016-11-16 MED ORDER — SODIUM CHLORIDE 0.9 % IV
INTRAVENOUS | Status: DC
Start: 2016-11-16 — End: 2016-11-16
  Administered 2016-11-16: 13:00:00 via INTRAVENOUS

## 2016-11-16 MED ORDER — MIDAZOLAM 1 MG/ML IJ SOLN
1 mg/mL | INTRAMUSCULAR | Status: DC | PRN
Start: 2016-11-16 — End: 2016-11-16

## 2016-11-16 MED ORDER — SODIUM CHLORIDE 0.9 % IV
INTRAVENOUS | Status: DC
Start: 2016-11-16 — End: 2016-11-16
  Administered 2016-11-16: 16:00:00 via INTRAVENOUS

## 2016-11-16 MED ORDER — HYDROMORPHONE 0.5 MG/0.5 ML SYRINGE
0.5 mg/ mL | Freq: Once | INTRAMUSCULAR | Status: AC
Start: 2016-11-16 — End: 2016-11-16

## 2016-11-16 MED ORDER — ONDANSETRON (PF) 4 MG/2 ML INJECTION
4 mg/2 mL | INTRAMUSCULAR | Status: DC | PRN
Start: 2016-11-16 — End: 2016-11-16
  Administered 2016-11-16: 18:00:00 via INTRAVENOUS

## 2016-11-16 MED ORDER — SODIUM CHLORIDE 0.9 % IJ SYRG
Freq: Three times a day (TID) | INTRAMUSCULAR | Status: DC
Start: 2016-11-16 — End: 2016-11-16
  Administered 2016-11-16: 13:00:00 via INTRAVENOUS

## 2016-11-16 MED ORDER — OXYCODONE-ACETAMINOPHEN 5 MG-325 MG TAB
5-325 mg | ORAL | Status: DC | PRN
Start: 2016-11-16 — End: 2016-11-16

## 2016-11-16 MED ORDER — BUPIVACAINE-EPINEPHRINE (PF) 0.25 %-1:200,000 IJ SOLN
0.25 %-1:200,000 | INTRAMUSCULAR | Status: DC | PRN
Start: 2016-11-16 — End: 2016-11-16
  Administered 2016-11-16: 15:00:00 via EPIDURAL

## 2016-11-16 MED ORDER — MIDAZOLAM 1 MG/ML IJ SOLN
1 mg/mL | INTRAMUSCULAR | Status: AC | PRN
Start: 2016-11-16 — End: 2016-11-16
  Administered 2016-11-16 (×4): via INTRAVENOUS

## 2016-11-16 MED ORDER — ONDANSETRON (PF) 4 MG/2 ML INJECTION
4 mg/2 mL | INTRAMUSCULAR | Status: DC | PRN
Start: 2016-11-16 — End: 2016-11-16
  Administered 2016-11-16 (×2): via INTRAVENOUS

## 2016-11-16 MED ORDER — LACTATED RINGERS IV
INTRAVENOUS | Status: DC
Start: 2016-11-16 — End: 2016-11-16
  Administered 2016-11-16: 16:00:00 via INTRAVENOUS

## 2016-11-16 MED ORDER — DIPHENHYDRAMINE HCL 50 MG/ML IJ SOLN
50 mg/mL | INTRAMUSCULAR | Status: DC | PRN
Start: 2016-11-16 — End: 2016-11-16

## 2016-11-16 MED ORDER — DOCUSATE SODIUM 100 MG CAP
100 mg | ORAL_CAPSULE | Freq: Two times a day (BID) | ORAL | 2 refills | Status: DC
Start: 2016-11-16 — End: 2017-02-01

## 2016-11-16 MED ORDER — HYDROMORPHONE 0.5 MG/0.5 ML SYRINGE
0.5 mg/ mL | INTRAMUSCULAR | Status: AC
Start: 2016-11-16 — End: ?

## 2016-11-16 MED ORDER — INSULIN REGULAR HUMAN 100 UNIT/ML INJECTION
100 unit/mL | INTRAMUSCULAR | Status: AC
Start: 2016-11-16 — End: ?

## 2016-11-16 MED ORDER — ROCURONIUM 10 MG/ML IV
10 mg/mL | INTRAVENOUS | Status: DC | PRN
Start: 2016-11-16 — End: 2016-11-16
  Administered 2016-11-16 (×3): via INTRAVENOUS

## 2016-11-16 MED ORDER — SODIUM CHLORIDE 0.9 % IJ SYRG
INTRAMUSCULAR | Status: DC | PRN
Start: 2016-11-16 — End: 2016-11-16

## 2016-11-16 MED ORDER — HYDROMORPHONE 0.5 MG/0.5 ML SYRINGE
0.5 mg/ mL | INTRAMUSCULAR | Status: AC
Start: 2016-11-16 — End: 2016-11-16
  Administered 2016-11-16: 18:00:00 via INTRAVENOUS

## 2016-11-16 MED ORDER — MIDAZOLAM 1 MG/ML IJ SOLN
1 mg/mL | INTRAMUSCULAR | Status: DC | PRN
Start: 2016-11-16 — End: 2016-11-16
  Administered 2016-11-16: 14:00:00 via INTRAVENOUS

## 2016-11-16 MED ORDER — FENTANYL CITRATE (PF) 50 MCG/ML IJ SOLN
50 mcg/mL | INTRAMUSCULAR | Status: DC | PRN
Start: 2016-11-16 — End: 2016-11-16

## 2016-11-16 MED ORDER — INSULIN REGULAR HUMAN 100 UNIT/ML INJECTION
100 unit/mL | INTRAMUSCULAR | Status: DC | PRN
Start: 2016-11-16 — End: 2016-11-16
  Administered 2016-11-16: 14:00:00 via SUBCUTANEOUS

## 2016-11-16 MED ORDER — FENTANYL CITRATE (PF) 50 MCG/ML IJ SOLN
50 mcg/mL | INTRAMUSCULAR | Status: AC
Start: 2016-11-16 — End: ?

## 2016-11-16 MED ORDER — GLYCOPYRROLATE 0.2 MG/ML IJ SOLN
0.2 mg/mL | INTRAMUSCULAR | Status: DC | PRN
Start: 2016-11-16 — End: 2016-11-16
  Administered 2016-11-16: 16:00:00 via INTRAVENOUS

## 2016-11-16 MED ORDER — LACTATED RINGERS IV
INTRAVENOUS | Status: DC
Start: 2016-11-16 — End: 2016-11-16

## 2016-11-16 MED ORDER — SUCCINYLCHOLINE CHLORIDE 20 MG/ML INJECTION
20 mg/mL | INTRAMUSCULAR | Status: DC | PRN
Start: 2016-11-16 — End: 2016-11-16
  Administered 2016-11-16: 14:00:00 via INTRAVENOUS

## 2016-11-16 MED ORDER — DEXMEDETOMIDINE 400 MCG/100 ML (4 MCG/ML) IN 0.9 % SODIUM CHLORIDE IV
400 mcg/100 mL (4 mcg/mL) | INTRAVENOUS | Status: DC | PRN
Start: 2016-11-16 — End: 2016-11-16
  Administered 2016-11-16 (×6): via INTRAVENOUS

## 2016-11-16 MED ORDER — LIDOCAINE (PF) 20 MG/ML (2 %) IJ SOLN
20 mg/mL (2 %) | INTRAMUSCULAR | Status: DC | PRN
Start: 2016-11-16 — End: 2016-11-16
  Administered 2016-11-16: 14:00:00 via INTRAVENOUS

## 2016-11-16 MED ORDER — HYDROMORPHONE (PF) 2 MG/ML IJ SOLN
2 mg/mL | INTRAMUSCULAR | Status: DC | PRN
Start: 2016-11-16 — End: 2016-11-16
  Administered 2016-11-16 (×2): via INTRAVENOUS

## 2016-11-16 MED ORDER — LACTATED RINGERS IV
INTRAVENOUS | Status: DC | PRN
Start: 2016-11-16 — End: 2016-11-16
  Administered 2016-11-16 (×2): via INTRAVENOUS

## 2016-11-16 MED ORDER — SODIUM CHLORIDE 0.9 % IJ SYRG
INTRAMUSCULAR | Status: DC | PRN
Start: 2016-11-16 — End: 2016-11-16
  Administered 2016-11-16: 18:00:00 via INTRAVENOUS

## 2016-11-16 MED ORDER — PROPOFOL 10 MG/ML IV EMUL
10 mg/mL | INTRAVENOUS | Status: DC | PRN
Start: 2016-11-16 — End: 2016-11-16
  Administered 2016-11-16 (×2): via INTRAVENOUS

## 2016-11-16 MED ORDER — NEOSTIGMINE METHYLSULFATE 1 MG/ML INJECTION
1 mg/mL | INTRAMUSCULAR | Status: DC | PRN
Start: 2016-11-16 — End: 2016-11-16
  Administered 2016-11-16: 16:00:00 via INTRAVENOUS

## 2016-11-16 MED ORDER — HYDROCODONE-ACETAMINOPHEN 5 MG-325 MG TAB
5-325 mg | ORAL_TABLET | ORAL | 0 refills | Status: DC | PRN
Start: 2016-11-16 — End: 2016-11-27

## 2016-11-16 MED ORDER — CEFAZOLIN 2 G IN 100 ML 0.9% NS
2 gram/100 mL | INTRAVENOUS | Status: DC | PRN
Start: 2016-11-16 — End: 2016-11-16
  Administered 2016-11-16: 14:00:00 via INTRAVENOUS

## 2016-11-16 MED ORDER — FENTANYL CITRATE (PF) 50 MCG/ML IJ SOLN
50 mcg/mL | INTRAMUSCULAR | Status: AC | PRN
Start: 2016-11-16 — End: 2016-11-16
  Administered 2016-11-16 (×4): via INTRAVENOUS

## 2016-11-16 MED ORDER — MORPHINE 10 MG/ML INJ SOLUTION
10 mg/ml | INTRAMUSCULAR | Status: DC | PRN
Start: 2016-11-16 — End: 2016-11-16

## 2016-11-16 MED ORDER — FENTANYL CITRATE (PF) 50 MCG/ML IJ SOLN
50 mcg/mL | INTRAMUSCULAR | Status: DC | PRN
Start: 2016-11-16 — End: 2016-11-16
  Administered 2016-11-16 (×2): via INTRAVENOUS

## 2016-11-16 MED FILL — OXYCODONE-ACETAMINOPHEN 5 MG-325 MG TAB: 5-325 mg | ORAL | Qty: 1

## 2016-11-16 MED FILL — LACTATED RINGERS IV: INTRAVENOUS | Qty: 1000

## 2016-11-16 MED FILL — SODIUM CHLORIDE 0.9 % IV: INTRAVENOUS | Qty: 1000

## 2016-11-16 MED FILL — INSULIN REGULAR HUMAN 100 UNIT/ML INJECTION: 100 unit/mL | INTRAMUSCULAR | Qty: 1

## 2016-11-16 MED FILL — XYLOCAINE-MPF 20 MG/ML (2 %) INJECTION SOLUTION: 20 mg/mL (2 %) | INTRAMUSCULAR | Qty: 5

## 2016-11-16 MED FILL — DILAUDID (PF) 0.5 MG/0.5 ML INJECTION SYRINGE: 0.5 mg/ mL | INTRAMUSCULAR | Qty: 0.5

## 2016-11-16 MED FILL — FENTANYL CITRATE (PF) 50 MCG/ML IJ SOLN: 50 mcg/mL | INTRAMUSCULAR | Qty: 2

## 2016-11-16 MED FILL — ROCURONIUM 10 MG/ML IV: 10 mg/mL | INTRAVENOUS | Qty: 6

## 2016-11-16 MED FILL — ONDANSETRON (PF) 4 MG/2 ML INJECTION: 4 mg/2 mL | INTRAMUSCULAR | Qty: 4

## 2016-11-16 MED FILL — LACTATED RINGERS IV: INTRAVENOUS | Qty: 1200

## 2016-11-16 MED FILL — ONDANSETRON (PF) 4 MG/2 ML INJECTION: 4 mg/2 mL | INTRAMUSCULAR | Qty: 2

## 2016-11-16 MED FILL — MIDAZOLAM 1 MG/ML IJ SOLN: 1 mg/mL | INTRAMUSCULAR | Qty: 2

## 2016-11-16 MED FILL — NEOSTIGMINE METHYLSULFATE 5 MG/5 ML (1 MG/ML) IV SYRINGE: 5 mg/ mL (1 mg/mL) | INTRAVENOUS | Qty: 3

## 2016-11-16 MED FILL — DIPRIVAN 10 MG/ML INTRAVENOUS EMULSION: 10 mg/mL | INTRAVENOUS | Qty: 18

## 2016-11-16 MED FILL — NORMAL SALINE FLUSH 0.9 % INJECTION SYRINGE: INTRAMUSCULAR | Qty: 10

## 2016-11-16 MED FILL — QUELICIN 20 MG/ML INJECTION SOLUTION: 20 mg/mL | INTRAMUSCULAR | Qty: 7

## 2016-11-16 MED FILL — CEFAZOLIN 2 GRAM/20 ML IN STERILE WATER INTRAVENOUS SYRINGE: 2 gram/0 mL | INTRAVENOUS | Qty: 20

## 2016-11-16 MED FILL — INSULIN REGULAR HUMAN 100 UNIT/ML INJECTION: 100 unit/mL | INTRAMUSCULAR | Qty: 4

## 2016-11-16 MED FILL — PRECEDEX 100 MCG/ML INTRAVENOUS SOLUTION: 100 mcg/mL | INTRAVENOUS | Qty: 30

## 2016-11-16 MED FILL — GLYCOPYRROLATE 0.6 MG/3 ML (0.2 MG/ML) INTRAVENOUS SYRINGE: 0.6 mg/3 mL (0.2 mg/mL) | INTRAVENOUS | Qty: 2.5

## 2016-11-16 NOTE — Other (Signed)
Patient: Alyssa [name] MRN: [id number]  SSN: [id number]   Date of Birth: [birth year]  Age: 36 y.o.  Sex: female     Patient is status post Procedure(s):  LAPAROSCOPIC INCISIONAL HERNIA REPAIR/ LYSIS OF ADHESIONS - 45 minutes.    Surgeon(s) and Role:     * Hetty Blend, MD - Primary    Local/Dose/Irrigation: see MAR                                         Dressing/Packing:  Wound Abdomen-DRESSING TYPE: Topical skin adhesive/glue (x3 trocar sites) (11/16/16 1100)  Splint/Cast:  ]    Other:

## 2016-11-16 NOTE — Brief Op Note (Signed)
BRIEF OPERATIVE NOTE    Date of Procedure: 11/16/2016   Preoperative Diagnosis: VENTRAL INCISIONAL HERNIA  Postoperative Diagnosis: VENTRAL INCISIONAL HERNIA    Procedure(s):  1) LAPAROSCOPIC INCISIONAL HERNIA REPAIR 2) LAPAROSCOPIC LYSIS OF ADHESIONS FOR > 45 minutes  Surgeon(s) and Role:     * Hetty Blend, MD - Primary         Surgical Assistant: None    Surgical Staff:  Circ-1: Cleophus Molt, RN  Circ-2: Twana First  Circ-Relief: Ranelle Oyster, RN  Scrub Tech-1: Lucile Crater  Scrub RN-Relief: Ranelle Oyster, RN  Surg Asst-1: Lyda Kalata  Event Time In   Incision Start 1016   Incision Close      Anesthesia: General   Estimated Blood Loss: 10 mL  Specimens: * No specimens in log *   Findings: Incisional hernia near umbilicus with multiple small, defects. Copious small bowel and omental adhesions to anterior abdominal wall prior prior surgeries.     Complications: None  Implants:   Implant Name Type Inv. Item Serial No. Manufacturer Lot No. LRB No. Used Action   MESH VENTRALIGHT ECHO 6X8 --  - SN/A   MESH VENTRALIGHT ECHO 6X8 --  N/A BARD DAVOL [id number] N/A 1 Implanted

## 2016-11-16 NOTE — Other (Signed)
Patient's family called and a message was left updating them on the progress of the case
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
## 2016-11-16 NOTE — Op Note (Signed)
[facility] REPORT    Name:Alyssa [name], Alyssa [name]  MR#: [id number]  DOB: 11/21/1980  ACCOUNT #: [id number]   DATE OF SERVICE: 11/16/2016    PREOPERATIVE DIAGNOSIS:  Ventral incisional hernia.    POSTOPERATIVE DIAGNOSIS:  Ventral incisional hernia.    PROCEDURES PERFORMED:  1.  Laparoscopic repair of incisional hernia with mesh.  2.  Laparoscopic lysis of adhesions for greater than 45 minutes.    SURGEON:  Charlene Brooke, MD    ASSISTANT:  Merceda Elks    ANESTHESIA:  General.    ESTIMATED BLOOD LOSS:  10 mL.    FINDINGS:  The patient had an incisional hernia near the umbilicus that was approximately 3 x 2 cm in size.  There were multiple subcentimeter small defects adjacent to this as well.  The patient had a copious small bowel and omental adhesions to the anterior abdominal wall from her prior surgeries.    INDICATIONS:  The patient is a 36 year old female who was referred secondary to development of an incisional hernia around the umbilicus.  She had a previous colectomy as well as a previous hysterectomy and had developed a hernia in the upper portion of her incision.  This was causing her discomfort and she requested repair.  A complete discussion of risks, benefits, and alternatives to surgery were had with the patient, and she was in agreement to proceed.  Informed consent was obtained.    DESCRIPTION OF OPERATION:  After informed consent was obtained, the patient was brought back to the operating room.  She was placed under general endotracheal anesthesia in the supine position on the operating room table.  Both arms were tucked, and the patient was then prepped and draped in the usual sterile fashion.  With this completed, a proper timeout was performed.  We then injected local anesthetic into the left upper quadrant skin and subcutaneous tissues approximately 1 fingerbreadth below the costal margin.  A small stab incision was made at this site and  a Veress needle was passed through this incision which was 1 fingerbreadth below the costal margin in the midclavicular line on the left side.  A standard water drop technique test was performed and the abdomen was then insufflated to 15 mmHg.  Once the abdomen was insufflated, we injected local anesthetic into the skin and subcutaneous tissues at the right upper quadrant just beneath the costal margin in the midclavicular line.  A 5 mm incision was made, and then we placed an Optiview trocar through this incision into the abdomen with the scope inserted.  The abdomen was entered safely.  We inspected the site of our Veress needle and there was noted to be no evidence of injury.  The Veress needle was then removed.  We then placed additional trocars including a 12 mm right mid abdominal trocar at the level of the anterior axillary line and a right lower quadrant 5 mm trocar anterior to the anterior superior iliac spine.  These were all placed after injection of local anesthetic and under direct visualization.  The patient was noted to have copious adhesions to the anterior abdominal wall around her midline incision.  The majority of this was omentum.  However, in the lower abdomen, there were copious, dense small bowel adhesions.  We began superiorly by taking down the omental adhesions using a combination of scissors and cautery.  As we reached the area where the small bowel adhesions were present, we used just sharp scissors laparoscopically to mobilize the  small intestine away from the anterior abdominal wall.  We were able to do this without injury or serosal tear to the small intestines.  Once we had this adequately cleared, this took approximately 45 minutes.  We inspected the anterior abdominal wall.  There was a 3 x 2 cm hernia at the upper portion of her incision around the umbilicus.  There were also noted to be several small, less than 1 cm defects adjacent to this consistent with a Swiss cheese  type abdomen.  The lower portion of her incision demonstrated no signs of incisional hernia.  We had good hemostasis and no evidence of bowel injury or contamination, and thus we proceeded to repair the hernia in a IPOM technique.  We chose a 6 x 8 inch Ventralight ST Echo mesh for coverage of the defect with the long axis of the mesh in the craniocaudal axis.  The mesh was secured with 2-0 Prolene and 2-0 nylon sutures in alternating quadrants at 12 o'clock, 3 o'clock, 6 o'clock, and 9 o'clock to be later used for transfascial sutures.  Once these were put in place, the mesh was rolled and placed in through the 12 mm trocar site.  The mesh was then pulled up to the anterior abdominal wall and the balloon inflated per the standard technique for this type of mesh.  This provided nice approximation of the mesh.  We then made small stab incisions at 12 o'clock, 3 o'clock, 6 o'clock, and 9 o'clock as coordinated with the edge of the mesh and the location of the sutures that were previously placed.  These were grasped separately using a suture passer after making small stab incisions with an 11 blade at each of these 4 locations.  These were then tied down for securing the transfascial fixation of the mesh.  Once this was completed and the sutures were cut, we then placed an outer row of tacks using a SecureStrap absorbable tacker device to place tacks around the periphery of the mesh.  The balloon tubing was then cut and then removed and then an inner row of tacks was   placed for a double crown technique.  This provided nice approximation of the mesh to the anterior abdominal wall without any folds or bending, and the edges were well secured.  There was no evidence of bleeding, and all hernia defects were nicely covered with good margin.  At this point, we turned our attention to the 12 mm trocar site in the right anterior axillary line.  This was closed using an 0 Vicryl suture on a suture  passer to reapproximate the fascia.  We then allowed the abdomen to desufflate.  Additional local anesthetic was injected at all incision sites, and then the skin incisions for the 3 laparoscopic trocar sites were closed using a 4-0 Monocryl subcuticular stitch followed by Dermabond skin adhesive.  All of the additional small stab incisions for the transfascial sutures and the Veress needle were closed using Dermabond skin adhesive.  The patient was then awakened, extubated, and taken to the postanesthesia care unit in stable condition.  All needle and instrument counts were correct at the completion of the case.  I was present and scrubbed throughout the entirety of the case.  There were no immediate complications.    SPECIMENS REMOVED:  None.    COMPLICATIONS:  None.    IMPLANTS:  Bard Ventralight Echo ST mesh 6 x 8 inches.    DISPOSITION:  The patient is in PACU.  Hetty Blend, MD       MAW / SN  D: 11/16/2016 11:57     T: 11/16/2016 14:28  JOB #: [id number]

## 2016-11-16 NOTE — Other (Addendum)
Patient's aunt verbalized understanding of all instructions.  RX for [facility] given.  Security called for belongings.

## 2016-11-16 NOTE — Other (Signed)
Post-op glucose in PACU 263.  DR Clydene Laming notified.  No coverage ordered for PACU.  "Have patient continue with what she uses at home".

## 2016-11-16 NOTE — Anesthesia Post-Procedure Evaluation (Signed)
Post-Anesthesia Evaluation and Assessment    Patient: Alyssa [name] MRN: [id number]  SSN: [id number]    Date of Birth: December 31, 1980  Age: 36 y.o.  Sex: female       Cardiovascular Function/Vital Signs  Visit Vitals   ??? BP 145/80   ??? Pulse 85   ??? Temp 36.7 ??C (98 ??F)   ??? Resp 17   ??? Ht 5\' 3"  (1.6 m)   ??? Wt 116.1 kg (256 lb)   ??? SpO2 95%   ??? BMI 45.35 kg/m2       Patient is status post general anesthesia for Procedure(s):  LAPAROSCOPIC INCISIONAL HERNIA REPAIR/ LYSIS OF ADHESIONS - 45 minutes.    Nausea/Vomiting: None    Postoperative hydration reviewed and adequate.    Pain:  Pain Scale 1: FLACC (11/16/16 1300)  Pain Intensity 1: 10 (11/16/16 1252)   Managed    Neurological Status:   Neuro (WDL): Within Defined Limits (11/16/16 0827)   At baseline    Mental Status and Level of Consciousness: Arousable    Pulmonary Status:   O2 Device: Nasal cannula (11/16/16 1300)   Adequate oxygenation and airway patent    Complications related to anesthesia: None    Post-anesthesia assessment completed. No concerns    Signed By: Armida Sans, MD     November 16, 2016

## 2016-11-16 NOTE — Anesthesia Pre-Procedure Evaluation (Signed)
Anesthetic History               Review of Systems / Medical History      Pulmonary            Asthma        Neuro/Psych              Cardiovascular    Hypertension                   GI/Hepatic/Renal     GERD      PUD     Endo/Other    Diabetes    Morbid obesity and arthritis     Other Findings              Physical Exam    Airway  Mallampati: II  TM Distance: > 6 cm  Neck ROM: normal range of motion   Mouth opening: Normal     Cardiovascular  Regular rate and rhythm,  S1 and S2 normal,  no murmur, click, rub, or gallop             Dental  No notable dental hx       Pulmonary  Breath sounds clear to auscultation               Abdominal  GI exam deferred       Other Findings            Anesthetic Plan    ASA: 3  Anesthesia type: general          Induction: Intravenous  Anesthetic plan and risks discussed with: Patient

## 2016-11-16 NOTE — Op Note (Signed)
[facility] REPORT    Name:Zettlemoyer, Adaleena  MR#: [id number]  DOB: 1980-04-27  ACCOUNT #: [id number]   DATE OF SERVICE: 11/16/2016    PREOPERATIVE DIAGNOSIS:  Ventral incisional hernia.    POSTOPERATIVE DIAGNOSIS:  Ventral incisional hernia.    PROCEDURES PERFORMED:  1.  Laparoscopic repair of incisional hernia with mesh.  2.  Laparoscopic lysis of adhesions for greater than 45 minutes.    SURGEON:  Charlene Brooke, MD    ASSISTANT:  Merceda Elks    ANESTHESIA:  General.    ESTIMATED BLOOD LOSS:  10 mL.    FINDINGS:  The patient had an incisional hernia near the umbilicus that was approximately 3 x 2 cm in size.  There were multiple subcentimeter small defects adjacent to this as well.  The patient had a copious small bowel and omental adhesions to the anterior abdominal wall from her prior surgeries.    INDICATIONS:  The patient is a 36 year old female who was referred secondary to development of an incisional hernia around the umbilicus.  She had a previous colectomy as well as a previous hysterectomy and had developed a hernia in the upper portion of her incision.  This was causing her discomfort and she requested repair.  A complete discussion of risks, benefits, and alternatives to surgery were had with the patient, and she was in agreement to proceed.  Informed consent was obtained.    DESCRIPTION OF OPERATION:  After informed consent was obtained, the patient was brought back to the operating room.  She was placed under general endotracheal anesthesia in the supine position on the operating room table.  Both arms were tucked, and the patient was then prepped and draped in the usual sterile fashion.  With this completed, a proper timeout was performed.  We then injected local anesthetic into the left upper quadrant skin and subcutaneous tissues approximately 1 fingerbreadth below the costal margin.  A small stab incision was made at this site and a Veress needle was passed  through this incision which was 1 fingerbreadth below the costal margin in the midclavicular line on the left side.  A standard water drop technique test was performed and the abdomen was then insufflated to 15 mmHg.  Once the abdomen was insufflated, we injected local anesthetic into the skin and subcutaneous tissues at the right upper quadrant just beneath the costal margin in the midclavicular line.  A 5 mm incision was made, and then we placed an Optiview trocar through this incision into the abdomen with the scope inserted.  The abdomen was entered safely.  We inspected the site of our Veress needle and there was noted to be no evidence of injury.  The Veress needle was then removed.  We then placed additional trocars including a 12 mm right mid abdominal trocar at the level of the anterior axillary line and a right lower quadrant 5 mm trocar anterior to the anterior superior iliac spine.  These were all placed after injection of local anesthetic and under direct visualization.  The patient was noted to have copious adhesions to the anterior abdominal wall around her midline incision.  The majority of this was omentum.  However, in the lower abdomen, there were copious, dense small bowel adhesions.  We began superiorly by taking down the omental adhesions using a combination of scissors and cautery.  As we reached the area where the small bowel adhesions were present, we used just sharp scissors laparoscopically to mobilize the  small intestine away from the anterior abdominal wall.  We were able to do this without injury or serosal tear to the small intestines.  Once we had this adequately cleared, this took approximately 45 minutes.  We inspected the anterior abdominal wall.  There was a 3 x 2 cm hernia at the upper portion of her incision around the umbilicus.  There were also noted to be several small, less than 1 cm defects adjacent to this consistent with a Swiss cheese type abdomen.  The lower portion of  her incision demonstrated no signs of incisional hernia.  We had good hemostasis and no evidence of bowel injury or contamination, and thus we proceeded to repair the hernia in a IPOM technique.  We chose a 6 x 8 inch Ventralight ST Echo mesh for coverage of the defect with the long axis of the mesh in the craniocaudal axis.  The mesh was secured with 2-0 Prolene and 2-0 nylon sutures in alternating quadrants at 12 o'clock, 3 o'clock, 6 o'clock, and 9 o'clock to be later used for transfascial sutures.  Once these were put in place, the mesh was rolled and placed in through the 12 mm trocar site.  The mesh was then pulled up to the anterior abdominal wall and the balloon inflated per the standard technique for this type of mesh.  This provided nice approximation of the mesh.  We then made small stab incisions at 12 o'clock, 3 o'clock, 6 o'clock, and 9 o'clock as coordinated with the edge of the mesh and the location of the sutures that were previously placed.  These were grasped separately using a suture passer after making small stab incisions with an 11 blade at each of these 4 locations.  These were then tied down for securing the transfascial fixation of the mesh.  Once this was completed and the sutures were cut, we then placed an outer row of tacks using a SecureStrap absorbable tacker device to place tacks around the periphery of the mesh.  The balloon tubing was then cut and then removed and then an inner row of tacks was   placed for a double crown technique.  This provided nice approximation of the mesh to the anterior abdominal wall without any folds or bending, and the edges were well secured.  There was no evidence of bleeding, and all hernia defects were nicely covered with good margin.  At this point, we turned our attention to the 12 mm trocar site in the right anterior axillary line.  This was closed using an 0 Vicryl suture on a suture passer to reapproximate the fascia.  We then allowed the abdomen  to desufflate.  Additional local anesthetic was injected at all incision sites, and then the skin incisions for the 3 laparoscopic trocar sites were closed using a 4-0 Monocryl subcuticular stitch followed by Dermabond skin adhesive.  All of the additional small stab incisions for the transfascial sutures and the Veress needle were closed using Dermabond skin adhesive.  The patient was then awakened, extubated, and taken to the postanesthesia care unit in stable condition.  All needle and instrument counts were correct at the completion of the case.  I was present and scrubbed throughout the entirety of the case.  There were no immediate complications.    SPECIMENS REMOVED:  None.    COMPLICATIONS:  None.    IMPLANTS:  Bard Ventralight Echo ST mesh 6 x 8 inches.    DISPOSITION:  The patient is in PACU.  Hetty Blend, MD       MAW / SN  D: 11/16/2016 11:57     T: 11/16/2016 14:28  JOB #: [id number]

## 2016-11-18 LAB — HERPES SIMPLEX VIRUS (HSV) NAA
HSV 1, NAA: NEGATIVE
HSV 2, NAA: NEGATIVE

## 2016-11-18 NOTE — Telephone Encounter (Signed)
Patient wants her x-ray results to be in [place].

## 2016-11-18 NOTE — Progress Notes (Signed)
Notified pt via mychart

## 2016-11-18 NOTE — Progress Notes (Signed)
Last read by [name] at 1:02 PM on 11/18/2016    She states the lesion is healing great.

## 2016-11-19 LAB — NUSWAB VAGINITIS PLUS
C. albicans, NAA: POSITIVE — AB
C. glabrata, NAA: NEGATIVE
C. trachomatis, NAA: NEGATIVE
N. gonorrhoeae, NAA: NEGATIVE
T. vaginalis, NAA: NEGATIVE

## 2016-11-19 NOTE — Progress Notes (Signed)
Message sent

## 2016-11-22 NOTE — Telephone Encounter (Signed)
From: Alyssa [name]  To: [name], MD  Sent: 11/18/2016 1:34 PM EDT  Subject:  Non-Urgent Medical Question    We  are fostering my niece and nephew and I need a something on my chart that says I don't have to and I know I took a chest x-ray on the 18th. The representative from [employer]) needs it. She just told me it needs to be screenshot to her or emailed to her by the close of business today that I don't have TB. I am sorry for any inconveniences this may cause. The representative is Amado Coe and her number is [phone] and her email is [email or web address]. Thank you for any help you can give me.  Alyssa [name]

## 2016-11-24 ENCOUNTER — Emergency Department: Admit: 2016-11-24 | Payer: MEDICAID | Primary: Family Medicine

## 2016-11-24 ENCOUNTER — Inpatient Hospital Stay
Admit: 2016-11-24 | Discharge: 2016-11-28 | Disposition: A | Discharge status: Home / Self Care | Payer: MEDICAID | Attending: Surgery | Admitting: Surgery

## 2016-11-24 DIAGNOSIS — G8918 Other acute postprocedural pain: Principal | ICD-10-CM

## 2016-11-24 LAB — CBC WITH AUTOMATED DIFF
ABS. BASOPHILS: 0.1 10*3/uL (ref 0.0–0.1)
ABS. EOSINOPHILS: 0.3 10*3/uL (ref 0.0–0.4)
ABS. IMM. GRANS.: 0 10*3/uL (ref 0.00–0.04)
ABS. LYMPHOCYTES: 2 10*3/uL (ref 0.8–3.5)
ABS. MONOCYTES: 0.6 10*3/uL (ref 0.0–1.0)
ABS. NEUTROPHILS: 4.9 10*3/uL (ref 1.8–8.0)
ABSOLUTE NRBC: 0 10*3/uL (ref 0.00–0.01)
BASOPHILS: 1 % (ref 0–1)
EOSINOPHILS: 4 % (ref 0–7)
HCT: 39.9 % (ref 35.0–47.0)
HGB: 12.7 g/dL (ref 11.5–16.0)
IMMATURE GRANULOCYTES: 1 % — ABNORMAL HIGH (ref 0.0–0.5)
LYMPHOCYTES: 26 % (ref 12–49)
MCH: 25.9 PG — ABNORMAL LOW (ref 26.0–34.0)
MCHC: 31.8 g/dL (ref 30.0–36.5)
MCV: 81.3 FL (ref 80.0–99.0)
MONOCYTES: 7 % (ref 5–13)
MPV: 10.8 FL (ref 8.9–12.9)
NEUTROPHILS: 62 % (ref 32–75)
NRBC: 0 PER 100 WBC
PLATELET: 460 10*3/uL — ABNORMAL HIGH (ref 150–400)
RBC: 4.91 M/uL (ref 3.80–5.20)
RDW: 15.9 % — ABNORMAL HIGH (ref 11.5–14.5)
WBC: 7.8 10*3/uL (ref 3.6–11.0)

## 2016-11-24 LAB — URINALYSIS W/MICROSCOPIC
Bacteria: NEGATIVE /hpf
Bilirubin: NEGATIVE
Blood: NEGATIVE
Glucose: >1000 mg/dL — AB
Leukocyte Esterase: NEGATIVE
Nitrites: NEGATIVE
Specific gravity: 1.026 (ref 1.003–1.030)
Urobilinogen: 1 EU/dL (ref 0.2–1.0)
pH (UA): 6.5 (ref 5.0–8.0)

## 2016-11-24 LAB — METABOLIC PANEL, COMPREHENSIVE
A-G Ratio: 0.7 — ABNORMAL LOW (ref 1.1–2.2)
ALT (SGPT): 17 U/L (ref 12–78)
AST (SGOT): 12 U/L — ABNORMAL LOW (ref 15–37)
Albumin: 3.3 g/dL — ABNORMAL LOW (ref 3.5–5.0)
Alk. phosphatase: 72 U/L (ref 45–117)
Anion gap: 9 mmol/L (ref 5–15)
BUN/Creatinine ratio: 12 (ref 12–20)
BUN: 8 MG/DL (ref 6–20)
Bilirubin, total: 0.6 MG/DL (ref 0.2–1.0)
CO2: 26 mmol/L (ref 21–32)
Calcium: 9.1 MG/DL (ref 8.5–10.1)
Chloride: 101 mmol/L (ref 97–108)
Creatinine: 0.65 MG/DL (ref 0.55–1.02)
GFR est AA: >60 mL/min/{1.73_m2} (ref >60)
GFR est non-AA: >60 mL/min/{1.73_m2} (ref >60)
Globulin: 5 g/dL — ABNORMAL HIGH (ref 2.0–4.0)
Glucose: 244 mg/dL — ABNORMAL HIGH (ref 65–100)
Potassium: 3.8 mmol/L (ref 3.5–5.1)
Protein, total: 8.3 g/dL — ABNORMAL HIGH (ref 6.4–8.2)
Sodium: 136 mmol/L (ref 136–145)

## 2016-11-24 LAB — GLUCOSE, POC: Glucose (POC): 180 mg/dL — ABNORMAL HIGH (ref 65–100)

## 2016-11-24 LAB — LIPASE: Lipase: 106 U/L (ref 73–393)

## 2016-11-24 LAB — URINE CULTURE HOLD SAMPLE

## 2016-11-24 MED ORDER — LACTATED RINGERS IV
INTRAVENOUS | Status: DC
Start: 2016-11-24 — End: 2016-11-28
  Administered 2016-11-26 – 2016-11-28 (×7): via INTRAVENOUS

## 2016-11-24 MED ORDER — .PHARMACY TO SUBSTITUTE PER PROTOCOL
Status: DC | PRN
Start: 2016-11-24 — End: 2016-11-25

## 2016-11-24 MED ORDER — DIPHENHYDRAMINE HCL 50 MG/ML IJ SOLN
50 mg/mL | Freq: Once | INTRAMUSCULAR | Status: AC | PRN
Start: 2016-11-24 — End: 2016-11-25

## 2016-11-24 MED ORDER — SODIUM CHLORIDE 0.9 % IJ SYRG
INTRAMUSCULAR | Status: DC | PRN
Start: 2016-11-24 — End: 2016-11-28

## 2016-11-24 MED ORDER — FENTANYL CITRATE (PF) 50 MCG/ML IJ SOLN
50 mcg/mL | INTRAMUSCULAR | Status: AC
Start: 2016-11-24 — End: 2016-11-24
  Administered 2016-11-24: 15:00:00 via INTRAVENOUS

## 2016-11-24 MED ORDER — SODIUM CHLORIDE 0.9 % IJ SYRG
Freq: Once | INTRAMUSCULAR | Status: AC
Start: 2016-11-24 — End: 2016-11-24
  Administered 2016-11-24: 16:00:00 via INTRAVENOUS

## 2016-11-24 MED ORDER — FAMOTIDINE 20 MG TAB
20 mg | Freq: Every evening | ORAL | Status: DC
Start: 2016-11-24 — End: 2016-11-25
  Administered 2016-11-24: 22:00:00 via ORAL

## 2016-11-24 MED ORDER — .PHARMACY TO SUBSTITUTE PER PROTOCOL
Status: DC | PRN
Start: 2016-11-24 — End: 2016-11-24

## 2016-11-24 MED ORDER — BUDESONIDE 0.5 MG/2 ML NEB SUSPENSION
0.5 mg/2 mL | Freq: Two times a day (BID) | RESPIRATORY_TRACT | Status: DC
Start: 2016-11-24 — End: 2016-11-28
  Administered 2016-11-24 – 2016-11-28 (×10): via RESPIRATORY_TRACT

## 2016-11-24 MED ORDER — ENOXAPARIN 40 MG/0.4 ML SUB-Q SYRINGE
40 mg/0.4 mL | SUBCUTANEOUS | Status: DC
Start: 2016-11-24 — End: 2016-11-28
  Administered 2016-11-24 – 2016-11-27 (×4): via SUBCUTANEOUS

## 2016-11-24 MED ORDER — ACETAMINOPHEN 325 MG TABLET
325 mg | Freq: Four times a day (QID) | ORAL | Status: DC
Start: 2016-11-24 — End: 2016-11-28
  Administered 2016-11-25 – 2016-11-28 (×15): via ORAL

## 2016-11-24 MED ORDER — ONDANSETRON (PF) 4 MG/2 ML INJECTION
4 mg/2 mL | INTRAMUSCULAR | Status: DC | PRN
Start: 2016-11-24 — End: 2016-11-28
  Administered 2016-11-25 – 2016-11-26 (×2): via INTRAVENOUS

## 2016-11-24 MED ORDER — HYDROMORPHONE 4 MG TAB
4 mg | ORAL | Status: DC | PRN
Start: 2016-11-24 — End: 2016-11-25
  Administered 2016-11-25 (×3): via ORAL

## 2016-11-24 MED ORDER — ONDANSETRON (PF) 4 MG/2 ML INJECTION
4 mg/2 mL | INTRAMUSCULAR | Status: AC
Start: 2016-11-24 — End: 2016-11-24
  Administered 2016-11-24: 17:00:00 via INTRAVENOUS

## 2016-11-24 MED ORDER — HYDROMORPHONE 2 MG TAB
2 mg | ORAL | Status: DC | PRN
Start: 2016-11-24 — End: 2016-11-25
  Administered 2016-11-24: 22:00:00 via ORAL

## 2016-11-24 MED ORDER — ALBUTEROL SULFATE 0.083 % (0.83 MG/ML) SOLN FOR INHALATION
2.5 mg /3 mL (0.083 %) | RESPIRATORY_TRACT | Status: DC | PRN
Start: 2016-11-24 — End: 2016-11-28

## 2016-11-24 MED ORDER — LORATADINE 10 MG TAB
10 mg | Freq: Every day | ORAL | Status: DC | PRN
Start: 2016-11-24 — End: 2016-11-28

## 2016-11-24 MED ORDER — DOCUSATE SODIUM 100 MG CAP
100 mg | Freq: Two times a day (BID) | ORAL | Status: DC
Start: 2016-11-24 — End: 2016-11-28
  Administered 2016-11-24 – 2016-11-28 (×7): via ORAL

## 2016-11-24 MED ORDER — LISINOPRIL 10 MG TAB
10 mg | Freq: Every day | ORAL | Status: DC
Start: 2016-11-24 — End: 2016-11-28
  Administered 2016-11-25 – 2016-11-28 (×4): via ORAL

## 2016-11-24 MED ORDER — SODIUM CHLORIDE 0.9% BOLUS IV
0.9 % | Freq: Once | INTRAVENOUS | Status: AC
Start: 2016-11-24 — End: 2016-11-24
  Administered 2016-11-24: 16:00:00 via INTRAVENOUS

## 2016-11-24 MED ORDER — GABAPENTIN 300 MG CAP
300 mg | Freq: Three times a day (TID) | ORAL | Status: DC
Start: 2016-11-24 — End: 2016-11-28
  Administered 2016-11-24 – 2016-11-28 (×12): via ORAL

## 2016-11-24 MED ORDER — KETOROLAC TROMETHAMINE 30 MG/ML INJECTION
30 mg/mL (1 mL) | Freq: Four times a day (QID) | INTRAMUSCULAR | Status: AC
Start: 2016-11-24 — End: 2016-11-26
  Administered 2016-11-24 – 2016-11-26 (×8): via INTRAVENOUS

## 2016-11-24 MED ORDER — IOPAMIDOL 76 % IV SOLN
370 mg iodine /mL (76 %) | Freq: Once | INTRAVENOUS | Status: AC
Start: 2016-11-24 — End: 2016-11-24
  Administered 2016-11-24: 16:00:00 via INTRAVENOUS

## 2016-11-24 MED ORDER — OXYBUTYNIN CHLORIDE SR 5 MG 24 HR TAB
5 mg | Freq: Every day | ORAL | Status: DC
Start: 2016-11-24 — End: 2016-11-28
  Administered 2016-11-25 – 2016-11-28 (×4): via ORAL

## 2016-11-24 MED ORDER — GLIPIZIDE SR 5 MG 24 HR TAB
5 mg | Freq: Every day | ORAL | Status: DC
Start: 2016-11-24 — End: 2016-11-25
  Administered 2016-11-25: 11:00:00 via ORAL

## 2016-11-24 MED ORDER — VALACYCLOVIR 500 MG TAB
500 mg | Freq: Two times a day (BID) | ORAL | Status: AC
Start: 2016-11-24 — End: 2016-11-27
  Administered 2016-11-24 – 2016-11-27 (×6): via ORAL

## 2016-11-24 MED ORDER — ALBUTEROL SULFATE HFA 90 MCG/ACTUATION AEROSOL INHALER
90 mcg/actuation | RESPIRATORY_TRACT | Status: DC | PRN
Start: 2016-11-24 — End: 2016-11-24

## 2016-11-24 MED ORDER — FENTANYL CITRATE (PF) 50 MCG/ML IJ SOLN
50 mcg/mL | Freq: Once | INTRAMUSCULAR | Status: AC
Start: 2016-11-24 — End: 2016-11-24
  Administered 2016-11-24: 17:00:00 via INTRAVENOUS

## 2016-11-24 MED ORDER — SODIUM CHLORIDE 0.9 % IJ SYRG
Freq: Three times a day (TID) | INTRAMUSCULAR | Status: DC
Start: 2016-11-24 — End: 2016-11-28
  Administered 2016-11-25 – 2016-11-28 (×12): via INTRAVENOUS

## 2016-11-24 MED ORDER — ATORVASTATIN 10 MG TAB
10 mg | Freq: Every evening | ORAL | Status: DC
Start: 2016-11-24 — End: 2016-11-28
  Administered 2016-11-25 – 2016-11-28 (×4): via ORAL

## 2016-11-24 MED FILL — LACTATED RINGERS IV: INTRAVENOUS | Qty: 1000

## 2016-11-24 MED FILL — FENTANYL CITRATE (PF) 50 MCG/ML IJ SOLN: 50 mcg/mL | INTRAMUSCULAR | Qty: 2

## 2016-11-24 MED FILL — ONDANSETRON (PF) 4 MG/2 ML INJECTION: 4 mg/2 mL | INTRAMUSCULAR | Qty: 2

## 2016-11-24 MED FILL — ENOXAPARIN 40 MG/0.4 ML SUB-Q SYRINGE: 40 mg/0.4 mL | SUBCUTANEOUS | Qty: 0.4

## 2016-11-24 MED FILL — KETOROLAC TROMETHAMINE 30 MG/ML INJECTION: 30 mg/mL (1 mL) | INTRAMUSCULAR | Qty: 1

## 2016-11-24 MED FILL — MONOJECT PREFILL ADVANCED 0.9 % SODIUM CHLORIDE INJECTION SYRINGE: INTRAMUSCULAR | Qty: 10

## 2016-11-24 MED FILL — HYDROMORPHONE 2 MG TAB: 2 mg | ORAL | Qty: 1

## 2016-11-24 MED FILL — FAMOTIDINE 20 MG TAB: 20 mg | ORAL | Qty: 2

## 2016-11-24 MED FILL — VALTREX 500 MG TABLET: 500 mg | ORAL | Qty: 2

## 2016-11-24 MED FILL — BUDESONIDE 0.5 MG/2 ML NEB SUSPENSION: 0.5 mg/2 mL | RESPIRATORY_TRACT | Qty: 1

## 2016-11-24 MED FILL — GABAPENTIN 300 MG CAP: 300 mg | ORAL | Qty: 1

## 2016-11-24 MED FILL — DOK 100 MG CAPSULE: 100 mg | ORAL | Qty: 1

## 2016-11-24 NOTE — ED Triage Notes (Signed)
Patient reports having hernia surgery 11/16/2016 by Dr Dema Severin. Reports not being able to stand straight d/t abd pain. Pain x1 week.
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
## 2016-11-24 NOTE — Other (Signed)
TRANSFER - OUT REPORT:    Verbal report given to Maria,RN(name) on Alyssa [name]  being transferred to 500(unit) for routine progression of care       Report consisted of patient???s Situation, Background, Assessment and   Recommendations(SBAR).     Information from the following report(s) SBAR, [facility], [facility] and Recent Results was reviewed with the receiving nurse.    Lines:   Peripheral IV 11/24/16 Right Antecubital (Active)   Site Assessment Clean, dry, & intact 11/24/2016 11:26 AM   Phlebitis Assessment 0 11/24/2016 11:26 AM   Infiltration Assessment 0 11/24/2016 11:26 AM   Dressing Status Clean, dry, & intact 11/24/2016 11:26 AM        Opportunity for questions and clarification was provided.      Patient transported with:   [employer]

## 2016-11-24 NOTE — ED Notes (Signed)
Attempted to call report, was told nurse will call back when done with contact room.

## 2016-11-24 NOTE — Progress Notes (Signed)
TRANSFER - IN REPORT:    Verbal report received from [place], RN(name) on Shamara Soza  being received from ED (unit) for routine progression of care      Report consisted of patient???s Situation, Background, Assessment and   Recommendations(SBAR).     Information from the following report(s) ED Summary was reviewed with the receiving nurse.    Opportunity for questions and clarification was provided.      Assessment completed upon patient???s arrival to unit and care assumed.
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
## 2016-11-25 LAB — METABOLIC PANEL, BASIC
Anion gap: 13 mmol/L (ref 5–15)
BUN/Creatinine ratio: 20 (ref 12–20)
BUN: 13 MG/DL (ref 6–20)
CO2: 20 mmol/L — ABNORMAL LOW (ref 21–32)
Calcium: 8.6 MG/DL (ref 8.5–10.1)
Chloride: 99 mmol/L (ref 97–108)
Creatinine: 0.65 MG/DL (ref 0.55–1.02)
GFR est AA: >60 mL/min/{1.73_m2} (ref >60)
GFR est non-AA: >60 mL/min/{1.73_m2} (ref >60)
Glucose: 243 mg/dL — ABNORMAL HIGH (ref 65–100)
Potassium: 3.7 mmol/L (ref 3.5–5.1)
Sodium: 132 mmol/L — ABNORMAL LOW (ref 136–145)

## 2016-11-25 LAB — GLUCOSE, POC
Glucose (POC): 250 mg/dL — ABNORMAL HIGH (ref 65–100)
Glucose (POC): 243 mg/dL — ABNORMAL HIGH (ref 65–100)
Glucose (POC): 275 mg/dL — ABNORMAL HIGH (ref 65–100)
Glucose (POC): 180 mg/dL — ABNORMAL HIGH (ref 65–100)

## 2016-11-25 LAB — CBC W/O DIFF
ABSOLUTE NRBC: 0 10*3/uL (ref 0.00–0.01)
HCT: 38 % (ref 35.0–47.0)
HGB: 12.1 g/dL (ref 11.5–16.0)
MCH: 25.7 PG — ABNORMAL LOW (ref 26.0–34.0)
MCHC: 31.8 g/dL (ref 30.0–36.5)
MCV: 80.9 FL (ref 80.0–99.0)
MPV: 10.9 FL (ref 8.9–12.9)
NRBC: 0 PER 100 WBC
PLATELET: 466 10*3/uL — ABNORMAL HIGH (ref 150–400)
RBC: 4.7 M/uL (ref 3.80–5.20)
RDW: 15.7 % — ABNORMAL HIGH (ref 11.5–14.5)
WBC: 9.2 10*3/uL (ref 3.6–11.0)

## 2016-11-25 MED ORDER — SODIUM PHOSPHATES 19 GRAM-7 GRAM/118 ML ENEMA
19-7 gram/118 mL | RECTAL | Status: AC
Start: 2016-11-25 — End: 2016-11-25
  Administered 2016-11-25: 21:00:00 via RECTAL

## 2016-11-25 MED ORDER — PROCHLORPERAZINE EDISYLATE 5 MG/ML INJECTION
5 mg/mL | Freq: Four times a day (QID) | INTRAMUSCULAR | Status: DC | PRN
Start: 2016-11-25 — End: 2016-11-28

## 2016-11-25 MED ORDER — FENTANYL CITRATE (PF) 50 MCG/ML IJ SOLN
50 mcg/mL | INTRAMUSCULAR | Status: DC | PRN
Start: 2016-11-25 — End: 2016-11-28
  Administered 2016-11-25 – 2016-11-27 (×12): via INTRAVENOUS

## 2016-11-25 MED ORDER — POLYETHYLENE GLYCOL 3350 17 GRAM (100 %) ORAL POWDER PACKET
17 gram | Freq: Every day | ORAL | Status: DC
Start: 2016-11-25 — End: 2016-11-28
  Administered 2016-11-25 – 2016-11-28 (×4): via ORAL

## 2016-11-25 MED ORDER — GLUCOSE 4 GRAM CHEWABLE TAB
4 gram | ORAL | Status: DC | PRN
Start: 2016-11-25 — End: 2016-11-28

## 2016-11-25 MED ORDER — BISACODYL 10 MG RECTAL SUPPOSITORY
10 mg | Freq: Once | RECTAL | Status: AC
Start: 2016-11-25 — End: 2016-11-25
  Administered 2016-11-25: 13:00:00 via RECTAL

## 2016-11-25 MED ORDER — SODIUM CHLORIDE 0.9 % INJECTION
202 mg/2 mL | Freq: Two times a day (BID) | INTRAMUSCULAR | Status: DC
Start: 2016-11-25 — End: 2016-11-28
  Administered 2016-11-25 – 2016-11-28 (×7): via INTRAVENOUS

## 2016-11-25 MED ORDER — SITAGLIPTIN 100 MG TAB
100 mg | Freq: Every day | ORAL | Status: DC
Start: 2016-11-25 — End: 2016-11-25
  Administered 2016-11-25: 11:00:00 via ORAL

## 2016-11-25 MED ORDER — METFORMIN SR 500 MG 24 HR TABLET
500 mg | Freq: Every day | ORAL | Status: DC
Start: 2016-11-25 — End: 2016-11-25
  Administered 2016-11-25: 11:00:00 via ORAL

## 2016-11-25 MED ORDER — INSULIN REGULAR HUMAN 100 UNIT/ML INJECTION
100 unit/mL | Freq: Four times a day (QID) | INTRAMUSCULAR | Status: DC
Start: 2016-11-25 — End: 2016-11-28
  Administered 2016-11-25 – 2016-11-28 (×11): via SUBCUTANEOUS

## 2016-11-25 MED ORDER — DEXTROSE 50% IN WATER (D50W) IV SYRG
INTRAVENOUS | Status: DC | PRN
Start: 2016-11-25 — End: 2016-11-28

## 2016-11-25 MED ORDER — BISACODYL 10 MG RECTAL SUPPOSITORY
10 mg | Freq: Every day | RECTAL | Status: DC | PRN
Start: 2016-11-25 — End: 2016-11-28

## 2016-11-25 MED ORDER — GLUCAGON 1 MG INJECTION
1 mg | INTRAMUSCULAR | Status: DC | PRN
Start: 2016-11-25 — End: 2016-11-28

## 2016-11-25 MED FILL — VALTREX 500 MG TABLET: 500 mg | ORAL | Qty: 2

## 2016-11-25 MED FILL — FENTANYL CITRATE (PF) 50 MCG/ML IJ SOLN: 50 mcg/mL | INTRAMUSCULAR | Qty: 2

## 2016-11-25 MED FILL — ONDANSETRON (PF) 4 MG/2 ML INJECTION: 4 mg/2 mL | INTRAMUSCULAR | Qty: 2

## 2016-11-25 MED FILL — FAMOTIDINE (PF) 20 MG/2 ML IV: 20 mg/2 mL | INTRAVENOUS | Qty: 2

## 2016-11-25 MED FILL — INSULIN REGULAR HUMAN 100 UNIT/ML INJECTION: 100 unit/mL | INTRAMUSCULAR | Qty: 1

## 2016-11-25 MED FILL — FLEET ENEMA 19 GRAM-7 GRAM/118 ML: 19-7 gram/118 mL | RECTAL | Qty: 133

## 2016-11-25 MED FILL — ATORVASTATIN 10 MG TAB: 10 mg | ORAL | Qty: 1

## 2016-11-25 MED FILL — POLYETHYLENE GLYCOL 3350 17 GRAM (100 %) ORAL POWDER PACKET: 17 gram | ORAL | Qty: 1

## 2016-11-25 MED FILL — KETOROLAC TROMETHAMINE 30 MG/ML INJECTION: 30 mg/mL (1 mL) | INTRAMUSCULAR | Qty: 1

## 2016-11-25 MED FILL — BISAC-EVAC 10 MG RECTAL SUPPOSITORY: 10 mg | RECTAL | Qty: 1

## 2016-11-25 MED FILL — JANUVIA 100 MG TABLET: 100 mg | ORAL | Qty: 1

## 2016-11-25 MED FILL — HYDROMORPHONE 4 MG TAB: 4 mg | ORAL | Qty: 1

## 2016-11-25 MED FILL — OXYBUTYNIN CHLORIDE SR 5 MG 24 HR TAB: 5 mg | ORAL | Qty: 3

## 2016-11-25 MED FILL — PROCHLORPERAZINE EDISYLATE 5 MG/ML INJECTION: 5 mg/mL | INTRAMUSCULAR | Qty: 2

## 2016-11-25 MED FILL — ACETAMINOPHEN 325 MG TABLET: 325 mg | ORAL | Qty: 2

## 2016-11-25 MED FILL — DOK 100 MG CAPSULE: 100 mg | ORAL | Qty: 1

## 2016-11-25 MED FILL — LISINOPRIL 10 MG TAB: 10 mg | ORAL | Qty: 1

## 2016-11-25 MED FILL — GABAPENTIN 300 MG CAP: 300 mg | ORAL | Qty: 1

## 2016-11-25 MED FILL — METFORMIN SR 500 MG 24 HR TABLET: 500 mg | ORAL | Qty: 4

## 2016-11-25 MED FILL — GLIPIZIDE SR 5 MG 24 HR TAB: 5 mg | ORAL | Qty: 2

## 2016-11-25 MED FILL — NORMAL SALINE FLUSH 0.9 % INJECTION SYRINGE: INTRAMUSCULAR | Qty: 10

## 2016-11-25 MED FILL — BUDESONIDE 0.5 MG/2 ML NEB SUSPENSION: 0.5 mg/2 mL | RESPIRATORY_TRACT | Qty: 1

## 2016-11-25 NOTE — Progress Notes (Signed)
Bedside shift change report given to Lisa (oncoming nurse) by Cameron (offgoing nurse). Report included the following information SBAR.

## 2016-11-25 NOTE — Progress Notes (Addendum)
Pt requesting on call md be paged to update re BM and to request diet order  Call placed    Update: clear liquid diet ordered per Dr Gretchen Portela

## 2016-11-25 NOTE — Other (Signed)
DTC Progress Note    Recommendations/ Comments: Chart reviewed due to hyperglycemia. Noted Glipizide 10 mg BID, Januvia 100 mg, Metformin 2000 mg at breakfast and correction scale Regular insulin with normal sensitivity ordered. If appropriate, please consider:  1. Changing metformin to 1000 mg BID versus 2000 mg at breakfast  2. Changing correction scale to Humalog with normal sensitivity.     Current hospital DM medication: Glipizide 10 mg BID, Januvia 100 mg, Metformin 2000 mg at breakfast and correction scale Regular insulin with normal sensitivity.     Chart reviewed on Alyssa [name].    Patient is a 36 y.o. female with known diabetes on Jardiance 25 mg, Glipizide 10 mg BID and Janumet 50-1000 mg at home.    A1c:   Lab Results   Component Value Date/Time    Hemoglobin A1c 9.8 (H) 10/12/2016 08:41 AM    Hemoglobin A1c 9.6 (H) 07/01/2016 10:06 AM       Recent Glucose Results: Lab Results   Component Value Date/Time    GLU 243 (H) 11/25/2016 01:30 AM    GLUCPOC 275 (H) 11/25/2016 11:27 AM    GLUCPOC 243 (H) 11/25/2016 06:56 AM    GLUCPOC 250 (H) 11/24/2016 09:27 PM        Lab Results   Component Value Date/Time    Creatinine 0.65 11/25/2016 01:30 AM     Estimated Creatinine Clearance: 148.2 mL/min (based on Cr of 0.65).    Active Orders   Diet    DIET NPO        PO intake: Patient Vitals for the past 72 hrs:   % Diet Eaten   11/24/16 1950 100 %       Will continue to follow as needed.    Thank you  Eula Listen, RD, CDE

## 2016-11-25 NOTE — Progress Notes (Signed)
Reason for Admission:  Post-op pain, s/p hernia repair on 11/16/16, h/o DM, asthma, endometriosis, obesity, PUD, HTN                    RRAT Score:  8/low                   Plan for utilizing home health: She will not need home health                        Likelihood of Readmission:  low                         Transition of Care Plan:  She will return home and f/u with MD.                     Care Management Interventions  PCP Verified by CM: Yes Alyssa [name], last visit was 3 weeks ago)  Palliative Care Criteria Met (RRAT>21 & CHF Dx)?: No (RRAT score 8)  Mode of Transport at Discharge: Other (see comment) (family)  Discharge Durable Medical Equipment: No  Physical Therapy Consult: No  Occupational Therapy Consult: No  Current Support Network: Own Home  Confirm Follow Up Transport: Self  Plan discussed with Pt/Family/Caregiver: Yes  Freedom of Choice Offered: Yes  Kelliher Provided?: No  Discharge Location  Discharge Placement: Home     Joanna Puff, RN ACM CRM

## 2016-11-25 NOTE — Progress Notes (Addendum)
[facility]      Subjective     Pain somewhat better controlled overnight but patient now concerned about her constipation.  She is passing gas.  Had some nausea as well last night but wants breakfast this morning.  Afebrile.      Objective     Patient Vitals for the past 24 hrs:   Temp Pulse Resp BP SpO2   11/25/16 0415 98 ??F (36.7 ??C) 82 16 112/75 97 %   11/25/16 0045 98 ??F (36.7 ??C) 82 16 (!) 143/91 97 %   11/24/16 1950 98.7 ??F (37.1 ??C) (!) 107 16 138/73 95 %   11/24/16 1947 - - - - 98 %   11/24/16 1659 99 ??F (37.2 ??C) 98 18 (!) 152/103 100 %   11/24/16 1600 98.3 ??F (36.8 ??C) 76 18 149/88 99 %   11/24/16 1315 - 85 18 (!) 156/102 94 %   11/24/16 0930 98.7 ??F (37.1 ??C) 86 18 (!) 194/110 96 %         Date 11/24/16 0700 - 11/25/16 0659 11/25/16 0700 - 11/26/16 0659   Shift 0700-1859 1900-0659 24 Hour Total 0700-1859 1638-4665 24 Hour Total   I  N  T  A  K  E   P.O. 120  120         P.O. 120  120       Shift Total  (mL/kg) 120  (1)  120  (1)      O  U  T  P  U  T   Urine  (mL/kg/hr)            Urine Occurrence(s) 1 x 1 x 2 x       Shift Total  (mL/kg)         NET 120  120      Weight (kg) 119.3 119.3 119.3 119.3 119.3 119.3       PE  GEN - Awake, alert, communicating appropriately.  NAD  Pulm - CTAB  CV - RRR  Abd - soft, ND, appropriately tender.  Incisions c/d/i.   Ext - warm, well perfused.     Labs  Recent Results (from the past 24 hour(s))   CBC WITH AUTOMATED DIFF    Collection Time: 11/24/16  9:50 AM   Result Value Ref Range    WBC 7.8 3.6 - 11.0 K/uL    RBC 4.91 3.80 - 5.20 M/uL    HGB 12.7 11.5 - 16.0 g/dL    HCT 39.9 35.0 - 47.0 %    MCV 81.3 80.0 - 99.0 FL    MCH 25.9 (L) 26.0 - 34.0 PG    MCHC 31.8 30.0 - 36.5 g/dL    RDW 15.9 (H) 11.5 - 14.5 %    PLATELET 460 (H) 150 - 400 K/uL    MPV 10.8 8.9 - 12.9 FL    NRBC 0.0 0 PER 100 WBC    ABSOLUTE NRBC 0.00 0.00 - 0.01 K/uL    NEUTROPHILS 62 32 - 75 %    LYMPHOCYTES 26 12 - 49 %     MONOCYTES 7 5 - 13 %    EOSINOPHILS 4 0 - 7 %    BASOPHILS 1 0 - 1 %    IMMATURE GRANULOCYTES 1 (H) 0.0 - 0.5 %    ABS. NEUTROPHILS 4.9 1.8 - 8.0 K/UL    ABS. LYMPHOCYTES 2.0 0.8 - 3.5 K/UL    ABS. MONOCYTES 0.6 0.0 -  1.0 K/UL    ABS. EOSINOPHILS 0.3 0.0 - 0.4 K/UL    ABS. BASOPHILS 0.1 0.0 - 0.1 K/UL    ABS. IMM. GRANS. 0.0 0.00 - 0.04 K/UL    DF AUTOMATED     METABOLIC PANEL, COMPREHENSIVE    Collection Time: 11/24/16  9:50 AM   Result Value Ref Range    Sodium 136 136 - 145 mmol/L    Potassium 3.8 3.5 - 5.1 mmol/L    Chloride 101 97 - 108 mmol/L    CO2 26 21 - 32 mmol/L    Anion gap 9 5 - 15 mmol/L    Glucose 244 (H) 65 - 100 mg/dL    BUN 8 6 - 20 MG/DL    Creatinine 0.65 0.55 - 1.02 MG/DL    BUN/Creatinine ratio 12 12 - 20      GFR est AA >60 >60 ml/min/1.29m    GFR est non-AA >60 >60 ml/min/1.768m   Calcium 9.1 8.5 - 10.1 MG/DL    Bilirubin, total 0.6 0.2 - 1.0 MG/DL    ALT (SGPT) 17 12 - 78 U/L    AST (SGOT) 12 (L) 15 - 37 U/L    Alk. phosphatase 72 45 - 117 U/L    Protein, total 8.3 (H) 6.4 - 8.2 g/dL    Albumin 3.3 (L) 3.5 - 5.0 g/dL    Globulin 5.0 (H) 2.0 - 4.0 g/dL    A-G Ratio 0.7 (L) 1.1 - 2.2     LIPASE    Collection Time: 11/24/16  9:50 AM   Result Value Ref Range    Lipase 106 73 - 393 U/L   URINALYSIS W/MICROSCOPIC    Collection Time: 11/24/16 11:28 AM   Result Value Ref Range    Color YELLOW/STRAW      Appearance CLEAR CLEAR      Specific gravity 1.026 1.003 - 1.030      pH (UA) 6.5 5.0 - 8.0      Protein TRACE (A) NEG mg/dL    Glucose >1000 (A) NEG mg/dL    Ketone TRACE (A) NEG mg/dL    Bilirubin NEGATIVE  NEG      Blood NEGATIVE  NEG      Urobilinogen 1.0 0.2 - 1.0 EU/dL    Nitrites NEGATIVE  NEG      Leukocyte Esterase NEGATIVE  NEG      WBC 0-4 0 - 4 /hpf    RBC 0-5 0 - 5 /hpf    Epithelial cells MODERATE (A) FEW /lpf    Bacteria NEGATIVE  NEG /hpf    Hyaline cast 0-2 0 - 5 /lpf   URINE CULTURE HOLD SAMPLE    Collection Time: 11/24/16 11:28 AM   Result Value Ref Range     Urine culture hold        URINE ON HOLD IN MICROBIOLOGY DEPT FOR 3 DAYS. IF UNPRESERVED URINE IS SUBMITTED, IT CANNOT BE USED FOR ADDITIONAL TESTING AFTER 24 HRS, RECOLLECTION WILL BE REQUIRED.   GLUCOSE, POC    Collection Time: 11/24/16  5:13 PM   Result Value Ref Range    Glucose (POC) 180 (H) 65 - 100 mg/dL    Performed by JoFuller Canada  GLUCOSE, POC    Collection Time: 11/24/16  9:27 PM   Result Value Ref Range    Glucose (POC) 250 (H) 65 - 100 mg/dL    Performed by ErDorisann FramesPCT)    METABOLIC PANEL, BASIC  Collection Time: 11/25/16  1:30 AM   Result Value Ref Range    Sodium 132 (L) 136 - 145 mmol/L    Potassium 3.7 3.5 - 5.1 mmol/L    Chloride 99 97 - 108 mmol/L    CO2 20 (L) 21 - 32 mmol/L    Anion gap 13 5 - 15 mmol/L    Glucose 243 (H) 65 - 100 mg/dL    BUN 13 6 - 20 MG/DL    Creatinine 0.65 0.55 - 1.02 MG/DL    BUN/Creatinine ratio 20 12 - 20      GFR est AA >60 >60 ml/min/1.16m    GFR est non-AA >60 >60 ml/min/1.766m   Calcium 8.6 8.5 - 10.1 MG/DL   CBC W/O DIFF    Collection Time: 11/25/16  1:30 AM   Result Value Ref Range    WBC 9.2 3.6 - 11.0 K/uL    RBC 4.70 3.80 - 5.20 M/uL    HGB 12.1 11.5 - 16.0 g/dL    HCT 38.0 35.0 - 47.0 %    MCV 80.9 80.0 - 99.0 FL    MCH 25.7 (L) 26.0 - 34.0 PG    MCHC 31.8 30.0 - 36.5 g/dL    RDW 15.7 (H) 11.5 - 14.5 %    PLATELET 466 (H) 150 - 400 K/uL    MPV 10.9 8.9 - 12.9 FL    NRBC 0.0 0 PER 100 WBC    ABSOLUTE NRBC 0.00 0.00 - 0.01 K/uL   GLUCOSE, POC    Collection Time: 11/25/16  6:56 AM   Result Value Ref Range    Glucose (POC) 243 (H) 65 - 100 mg/dL    Performed by ErDorisann FramesPCT)        Assessment     Alexine BrVeilleuxs a [age].o.yr old female with post-op pain, nausea and constipation after a laparoscopic ventral hernia repair.      Plan     - Add miralax and dulcolax suppository to bowel regimen today  - Continue current pain regimen  - Encourage ambulation  - Diet as tolerated  - Decrease IVF  - Lovenox and SCD's for DVT proph  - Home meds   - Hopefully home later today or tomorrow    MiHetty BlendMD  11/25/2016  7:34 AM      Addendum:   Patient with a couple episodes of n/v during the day.  No signs of obstruction CT yesterday.  No flatus or stool today.  Will make NPO except ice chips.  Changed pain and nausea meds to IV.  On IVF.  Will check KUB in am.  Labs in AMWesley ChapelMD  11/25/2016  4:21 PM

## 2016-11-25 NOTE — Other (Signed)
Bedside and Verbal shift change report given to Lysbeth Galas, RN (oncoming nurse) by Sonia Baller, RN (offgoing nurse). Report included the following information SBAR, Kardex, Intake/Output, MAR, Accordion and Recent Results.

## 2016-11-26 ENCOUNTER — Observation Stay: Admit: 2016-11-26 | Payer: MEDICAID | Primary: Family Medicine

## 2016-11-26 LAB — CBC WITH AUTOMATED DIFF
ABS. BASOPHILS: 0.1 10*3/uL (ref 0.0–0.1)
ABS. EOSINOPHILS: 0.3 10*3/uL (ref 0.0–0.4)
ABS. IMM. GRANS.: 0 10*3/uL (ref 0.00–0.04)
ABS. LYMPHOCYTES: 2.2 10*3/uL (ref 0.8–3.5)
ABS. MONOCYTES: 0.5 10*3/uL (ref 0.0–1.0)
ABS. NEUTROPHILS: 4 10*3/uL (ref 1.8–8.0)
ABSOLUTE NRBC: 0 10*3/uL (ref 0.00–0.01)
BASOPHILS: 1 % (ref 0–1)
EOSINOPHILS: 5 % (ref 0–7)
HCT: 35.5 % (ref 35.0–47.0)
HGB: 11.1 g/dL — ABNORMAL LOW (ref 11.5–16.0)
IMMATURE GRANULOCYTES: 0 % (ref 0.0–0.5)
LYMPHOCYTES: 31 % (ref 12–49)
MCH: 25.5 PG — ABNORMAL LOW (ref 26.0–34.0)
MCHC: 31.3 g/dL (ref 30.0–36.5)
MCV: 81.6 FL (ref 80.0–99.0)
MONOCYTES: 8 % (ref 5–13)
MPV: 11 FL (ref 8.9–12.9)
NEUTROPHILS: 56 % (ref 32–75)
NRBC: 0 PER 100 WBC
PLATELET: 425 10*3/uL — ABNORMAL HIGH (ref 150–400)
RBC: 4.35 M/uL (ref 3.80–5.20)
RDW: 15.7 % — ABNORMAL HIGH (ref 11.5–14.5)
WBC: 7.1 10*3/uL (ref 3.6–11.0)

## 2016-11-26 LAB — GLUCOSE, POC
Glucose (POC): 135 mg/dL — ABNORMAL HIGH (ref 65–100)
Glucose (POC): 150 mg/dL — ABNORMAL HIGH (ref 65–100)
Glucose (POC): 154 mg/dL — ABNORMAL HIGH (ref 65–100)
Glucose (POC): 142 mg/dL — ABNORMAL HIGH (ref 65–100)

## 2016-11-26 LAB — METABOLIC PANEL, BASIC
Anion gap: 11 mmol/L (ref 5–15)
BUN/Creatinine ratio: 19 (ref 12–20)
BUN: 12 MG/DL (ref 6–20)
CO2: 23 mmol/L (ref 21–32)
Calcium: 8.5 MG/DL (ref 8.5–10.1)
Chloride: 104 mmol/L (ref 97–108)
Creatinine: 0.63 MG/DL (ref 0.55–1.02)
GFR est AA: >60 mL/min/{1.73_m2} (ref >60)
GFR est non-AA: >60 mL/min/{1.73_m2} (ref >60)
Glucose: 150 mg/dL — ABNORMAL HIGH (ref 65–100)
Potassium: 3.5 mmol/L (ref 3.5–5.1)
Sodium: 138 mmol/L (ref 136–145)

## 2016-11-26 MED ORDER — ONDANSETRON (PF) 4 MG/2 ML INJECTION
4 mg/2 mL | Freq: Four times a day (QID) | INTRAMUSCULAR | Status: DC
Start: 2016-11-26 — End: 2016-11-27
  Administered 2016-11-26 – 2016-11-27 (×4): via INTRAVENOUS

## 2016-11-26 MED FILL — FENTANYL CITRATE (PF) 50 MCG/ML IJ SOLN: 50 mcg/mL | INTRAMUSCULAR | Qty: 2

## 2016-11-26 MED FILL — INSULIN REGULAR HUMAN 100 UNIT/ML INJECTION: 100 unit/mL | INTRAMUSCULAR | Qty: 1

## 2016-11-26 MED FILL — VALTREX 500 MG TABLET: 500 mg | ORAL | Qty: 2

## 2016-11-26 MED FILL — KETOROLAC TROMETHAMINE 30 MG/ML INJECTION: 30 mg/mL (1 mL) | INTRAMUSCULAR | Qty: 1

## 2016-11-26 MED FILL — OXYBUTYNIN CHLORIDE SR 5 MG 24 HR TAB: 5 mg | ORAL | Qty: 3

## 2016-11-26 MED FILL — NORMAL SALINE FLUSH 0.9 % INJECTION SYRINGE: INTRAMUSCULAR | Qty: 10

## 2016-11-26 MED FILL — FAMOTIDINE (PF) 20 MG/2 ML IV: 20 mg/2 mL | INTRAVENOUS | Qty: 2

## 2016-11-26 MED FILL — BUDESONIDE 0.5 MG/2 ML NEB SUSPENSION: 0.5 mg/2 mL | RESPIRATORY_TRACT | Qty: 1

## 2016-11-26 MED FILL — LISINOPRIL 10 MG TAB: 10 mg | ORAL | Qty: 1

## 2016-11-26 MED FILL — POLYETHYLENE GLYCOL 3350 17 GRAM (100 %) ORAL POWDER PACKET: 17 gram | ORAL | Qty: 1

## 2016-11-26 MED FILL — ONDANSETRON (PF) 4 MG/2 ML INJECTION: 4 mg/2 mL | INTRAMUSCULAR | Qty: 2

## 2016-11-26 MED FILL — ACETAMINOPHEN 325 MG TABLET: 325 mg | ORAL | Qty: 2

## 2016-11-26 MED FILL — DOK 100 MG CAPSULE: 100 mg | ORAL | Qty: 1

## 2016-11-26 MED FILL — GABAPENTIN 300 MG CAP: 300 mg | ORAL | Qty: 1

## 2016-11-26 MED FILL — ATORVASTATIN 10 MG TAB: 10 mg | ORAL | Qty: 1

## 2016-11-26 MED FILL — NORMAL SALINE FLUSH 0.9 % INJECTION SYRINGE: INTRAMUSCULAR | Qty: 30

## 2016-11-26 MED FILL — ENOXAPARIN 40 MG/0.4 ML SUB-Q SYRINGE: 40 mg/0.4 mL | SUBCUTANEOUS | Qty: 0.4

## 2016-11-26 NOTE — Progress Notes (Signed)
?? Surgery Progress Note    11/26/2016    Admit Date: 11/24/2016    Subjective:       Pt has complaint of n/v, did not tolerate clears last night, she is onIV fentanyl and taking it q 4, .  Pts pain present - adequately treated.  No SOB. No CP.  Pt is not  ambulating.  Patient 's current diet is Clear Liquid.. Pt reports  nausea and vomiting. Pt reports no fever or chills    Bowel Movements: bowel movement after fleets yesterday         Objective:     Blood pressure 144/87, pulse 74, temperature 97.9 ??F (36.6 ??C), resp. rate 18, height 5\' 3"  (1.6 m), weight 263 lb (119.3 kg), last menstrual period 03/08/2016, SpO2 93 %.              General appearance: alert, cooperative, no distress, moderately obese  Lungs: clear to auscultation bilaterally  Heart: regular rate and rhythm  PXT:GGYI, nondistended, tenderness mild - in the upper abdomen, without guarding, without rebound  Extremities: no edema  Neurologic: Grossly normal    Data Review    Recent Results (from the past 12 hour(s))   CBC WITH AUTOMATED DIFF    Collection Time: 11/26/16  5:22 AM   Result Value Ref Range    WBC 7.1 3.6 - 11.0 K/uL    RBC 4.35 3.80 - 5.20 M/uL    HGB 11.1 (L) 11.5 - 16.0 g/dL    HCT 35.5 35.0 - 47.0 %    MCV 81.6 80.0 - 99.0 FL    MCH 25.5 (L) 26.0 - 34.0 PG    MCHC 31.3 30.0 - 36.5 g/dL    RDW 15.7 (H) 11.5 - 14.5 %    PLATELET 425 (H) 150 - 400 K/uL    MPV 11.0 8.9 - 12.9 FL    NRBC 0.0 0 PER 100 WBC    ABSOLUTE NRBC 0.00 0.00 - 0.01 K/uL    NEUTROPHILS 56 32 - 75 %    LYMPHOCYTES 31 12 - 49 %    MONOCYTES 8 5 - 13 %    EOSINOPHILS 5 0 - 7 %    BASOPHILS 1 0 - 1 %    IMMATURE GRANULOCYTES 0 0.0 - 0.5 %    ABS. NEUTROPHILS 4.0 1.8 - 8.0 K/UL    ABS. LYMPHOCYTES 2.2 0.8 - 3.5 K/UL    ABS. MONOCYTES 0.5 0.0 - 1.0 K/UL    ABS. EOSINOPHILS 0.3 0.0 - 0.4 K/UL    ABS. BASOPHILS 0.1 0.0 - 0.1 K/UL    ABS. IMM. GRANS. 0.0 0.00 - 0.04 K/UL    DF AUTOMATED     METABOLIC PANEL, BASIC    Collection Time: 11/26/16  5:22 AM   Result Value Ref Range     Sodium 138 136 - 145 mmol/L    Potassium 3.5 3.5 - 5.1 mmol/L    Chloride 104 97 - 108 mmol/L    CO2 23 21 - 32 mmol/L    Anion gap 11 5 - 15 mmol/L    Glucose 150 (H) 65 - 100 mg/dL    BUN 12 6 - 20 MG/DL    Creatinine 0.63 0.55 - 1.02 MG/DL    BUN/Creatinine ratio 19 12 - 20      GFR est AA >60 >60 ml/min/1.3m2    GFR est non-AA >60 >60 ml/min/1.69m2    Calcium 8.5 8.5 - 10.1 MG/DL   GLUCOSE, POC  Collection Time: 11/26/16  6:48 AM   Result Value Ref Range    Glucose (POC) 150 (H) 65 - 100 mg/dL    Performed by Conni Elliot        Assessment:     Principal Problem:    Post-operative pain (11/24/2016)    Active Problems:    Abdominal wall seroma ([place]) (11/24/2016)      Pelvic ascites (11/24/2016)      Nausea and vomiting (11/24/2016)    POD 10 from hernia repair with pain, n/v.     Plan:   Diet sips as tolerted, zofran standing order,   Pain management--pt is on IV fentanyl   GI Prophylaxis  OOB  DM management   Continue anti emetics and bowel regimen, no acute surgical issues, wean IV narcotics as able   Further plan per Dr. Luellen Pucker PA-C

## 2016-11-26 NOTE — Progress Notes (Signed)
Bedside shift change report given to Lattie Haw, Therapist, sports (oncoming nurse) by Vicente Males RN, and Lolita Patella, RN (offgoing nurse). Report included the following information SBAR, Intake/Output and MAR.

## 2016-11-26 NOTE — Progress Notes (Signed)
Bedside and Verbal shift change report given to Mancel Bale., RN (oncoming nurse) by Lattie Haw, RN (offgoing nurse). Report included the following information SBAR, Kardex, ED Summary, Intake/Output, MAR and Recent Results.

## 2016-11-26 NOTE — Progress Notes (Signed)
I have read and agree with Assunta Curtis, RN ([place]) documentation.    Camille Bal, RN

## 2016-11-27 LAB — GLUCOSE, POC
Glucose (POC): 134 mg/dL — ABNORMAL HIGH (ref 65–100)
Glucose (POC): 163 mg/dL — ABNORMAL HIGH (ref 65–100)
Glucose (POC): 153 mg/dL — ABNORMAL HIGH (ref 65–100)
Glucose (POC): 214 mg/dL — ABNORMAL HIGH (ref 65–100)

## 2016-11-27 MED ORDER — HYDROMORPHONE 2 MG TAB
2 mg | ORAL | Status: DC | PRN
Start: 2016-11-27 — End: 2016-11-28
  Administered 2016-11-27 – 2016-11-28 (×6): via ORAL

## 2016-11-27 MED ORDER — DIPHENHYDRAMINE HCL 50 MG/ML IJ SOLN
50 mg/mL | INTRAMUSCULAR | Status: DC | PRN
Start: 2016-11-27 — End: 2016-11-28
  Administered 2016-11-27 – 2016-11-28 (×6): via INTRAVENOUS

## 2016-11-27 MED ORDER — HYDROCODONE-ACETAMINOPHEN 5 MG-325 MG TAB
5-325 mg | ORAL_TABLET | ORAL | 0 refills | Status: DC | PRN
Start: 2016-11-27 — End: 2016-11-28

## 2016-11-27 MED ORDER — ONDANSETRON 4 MG TAB, RAPID DISSOLVE
4 mg | ORAL_TABLET | Freq: Three times a day (TID) | ORAL | 0 refills | Status: DC | PRN
Start: 2016-11-27 — End: 2016-11-28

## 2016-11-27 MED FILL — VALTREX 500 MG TABLET: 500 mg | ORAL | Qty: 2

## 2016-11-27 MED FILL — GABAPENTIN 300 MG CAP: 300 mg | ORAL | Qty: 1

## 2016-11-27 MED FILL — ATORVASTATIN 10 MG TAB: 10 mg | ORAL | Qty: 1

## 2016-11-27 MED FILL — DOK 100 MG CAPSULE: 100 mg | ORAL | Qty: 1

## 2016-11-27 MED FILL — DIPHENHYDRAMINE HCL 50 MG/ML IJ SOLN: 50 mg/mL | INTRAMUSCULAR | Qty: 1

## 2016-11-27 MED FILL — NORMAL SALINE FLUSH 0.9 % INJECTION SYRINGE: INTRAMUSCULAR | Qty: 10

## 2016-11-27 MED FILL — LISINOPRIL 10 MG TAB: 10 mg | ORAL | Qty: 1

## 2016-11-27 MED FILL — INSULIN REGULAR HUMAN 100 UNIT/ML INJECTION: 100 unit/mL | INTRAMUSCULAR | Qty: 1

## 2016-11-27 MED FILL — FENTANYL CITRATE (PF) 50 MCG/ML IJ SOLN: 50 mcg/mL | INTRAMUSCULAR | Qty: 2

## 2016-11-27 MED FILL — BUDESONIDE 0.5 MG/2 ML NEB SUSPENSION: 0.5 mg/2 mL | RESPIRATORY_TRACT | Qty: 1

## 2016-11-27 MED FILL — FAMOTIDINE (PF) 20 MG/2 ML IV: 20 mg/2 mL | INTRAVENOUS | Qty: 2

## 2016-11-27 MED FILL — ACETAMINOPHEN 325 MG TABLET: 325 mg | ORAL | Qty: 2

## 2016-11-27 MED FILL — ENOXAPARIN 40 MG/0.4 ML SUB-Q SYRINGE: 40 mg/0.4 mL | SUBCUTANEOUS | Qty: 0.4

## 2016-11-27 MED FILL — NORMAL SALINE FLUSH 0.9 % INJECTION SYRINGE: INTRAMUSCULAR | Qty: 20

## 2016-11-27 MED FILL — ONDANSETRON (PF) 4 MG/2 ML INJECTION: 4 mg/2 mL | INTRAMUSCULAR | Qty: 2

## 2016-11-27 MED FILL — HYDROMORPHONE 2 MG TAB: 2 mg | ORAL | Qty: 1

## 2016-11-27 MED FILL — OXYBUTYNIN CHLORIDE SR 5 MG 24 HR TAB: 5 mg | ORAL | Qty: 3

## 2016-11-27 MED FILL — POLYETHYLENE GLYCOL 3350 17 GRAM (100 %) ORAL POWDER PACKET: 17 gram | ORAL | Qty: 1

## 2016-11-27 NOTE — Progress Notes (Signed)
Progress Note    Patient: Alyssa [name] MRN: [id number]  SSN: [id number]    Date of Birth: 1980-06-22  Age: 36 y.o.  Sex: female      Admit Date: 11/24/2016  Hernia repair    Subjective:     No acute surgical issues.  Pt reported some abdominal pain.  Has not been nauseated but she had scheduled Zofran.  Pt ready to try solids    Objective:     Visit Vitals   ??? BP 126/80 (BP 1 Location: Left arm, BP Patient Position: At rest)   ??? Pulse 73   ??? Temp 98.2 ??F (36.8 ??C)   ??? Resp 16   ??? Ht 5\' 3"  (1.6 m)   ??? Wt 263 lb (119.3 kg)   ??? SpO2 96%   ??? BMI 46.59 kg/m2       Temp (24hrs), Avg:98.5 ??F (36.9 ??C), Min:98.2 ??F (36.8 ??C), Max:98.7 ??F (37.1 ??C)        Physical Exam:    Gen:  NAD  Pulm:  Unlabored  Abd:  S/ND/appropriate TTP  Wound:  C/D/I    Recent Results (from the past 24 hour(s))   GLUCOSE, POC    Collection Time: 11/26/16 11:42 AM   Result Value Ref Range    Glucose (POC) 154 (H) 65 - 100 mg/dL    Performed by Oliver Barre    GLUCOSE, POC    Collection Time: 11/26/16  4:38 PM   Result Value Ref Range    Glucose (POC) 142 (H) 65 - 100 mg/dL    Performed by Fuller Canada    GLUCOSE, POC    Collection Time: 11/26/16  9:42 PM   Result Value Ref Range    Glucose (POC) 134 (H) 65 - 100 mg/dL    Performed by Trevor Mace    GLUCOSE, POC    Collection Time: 11/27/16  6:01 AM   Result Value Ref Range    Glucose (POC) 163 (H) 65 - 100 mg/dL    Performed by Michell Heinrich          Assessment:     Hospital Problems  Date Reviewed: Nov 04, 2016          Codes Class Noted POA    Abdominal wall seroma ([place]) ICD-10-CM: T88.8XXA, T79.2XXA  ICD-9-CM: 998.13  11/24/2016 Unknown        Pelvic ascites ICD-10-CM: R18.8  ICD-9-CM: 789.59  11/24/2016 Unknown        * (Principal)Post-operative pain ICD-10-CM: G89.18  ICD-9-CM: 338.18  11/24/2016 Unknown        Nausea and vomiting ICD-10-CM: R11.2  ICD-9-CM: 787.01  11/24/2016 Unknown              Plan/Recommendations/Medical Decision Making:     - Advance to GI lite diet  - Pain control   - DC schedule Zofran  - Possible DC home later today if pain is under control and pt is able to tolerate GI lite diet    Signed By: Carlis Abbott, MD     November 27, 2016

## 2016-11-27 NOTE — Progress Notes (Signed)
Informed MD pt's hands are itching. MD will place new orders.

## 2016-11-27 NOTE — Progress Notes (Signed)
Bedside and Verbal shift change report given to York Cerise, Therapist, sports (oncoming nurse) by Lattie Haw, RN (offgoing nurse). Report included the following information SBAR, Kardex, ED Summary, Intake/Output, MAR and Recent Results.

## 2016-11-28 ENCOUNTER — Encounter: Attending: Medical | Primary: Family Medicine

## 2016-11-28 LAB — GLUCOSE, POC
Glucose (POC): 200 mg/dL — ABNORMAL HIGH (ref 65–100)
Glucose (POC): 175 mg/dL — ABNORMAL HIGH (ref 65–100)
Glucose (POC): 204 mg/dL — ABNORMAL HIGH (ref 65–100)
Glucose (POC): 156 mg/dL — ABNORMAL HIGH (ref 65–100)

## 2016-11-28 MED ORDER — ONDANSETRON 4 MG TAB, RAPID DISSOLVE
4 mg | ORAL_TABLET | Freq: Three times a day (TID) | ORAL | 0 refills | Status: DC | PRN
Start: 2016-11-28 — End: 2017-03-07

## 2016-11-28 MED ORDER — POLYETHYLENE GLYCOL 3350 17 GRAM (100 %) ORAL POWDER PACKET
17 gram | PACK | Freq: Every day | ORAL | 0 refills | Status: DC | PRN
Start: 2016-11-28 — End: 2017-02-01

## 2016-11-28 MED ORDER — HYDROMORPHONE 2 MG TAB
2 mg | ORAL_TABLET | ORAL | 0 refills | Status: DC | PRN
Start: 2016-11-28 — End: 2017-02-01

## 2016-11-28 MED FILL — ACETAMINOPHEN 325 MG TABLET: 325 mg | ORAL | Qty: 2

## 2016-11-28 MED FILL — DIPHENHYDRAMINE HCL 50 MG/ML IJ SOLN: 50 mg/mL | INTRAMUSCULAR | Qty: 1

## 2016-11-28 MED FILL — NORMAL SALINE FLUSH 0.9 % INJECTION SYRINGE: INTRAMUSCULAR | Qty: 10

## 2016-11-28 MED FILL — INSULIN REGULAR HUMAN 100 UNIT/ML INJECTION: 100 unit/mL | INTRAMUSCULAR | Qty: 1

## 2016-11-28 MED FILL — HYDROMORPHONE 2 MG TAB: 2 mg | ORAL | Qty: 1

## 2016-11-28 MED FILL — LISINOPRIL 10 MG TAB: 10 mg | ORAL | Qty: 1

## 2016-11-28 MED FILL — OXYBUTYNIN CHLORIDE SR 5 MG 24 HR TAB: 5 mg | ORAL | Qty: 3

## 2016-11-28 MED FILL — BUDESONIDE 0.5 MG/2 ML NEB SUSPENSION: 0.5 mg/2 mL | RESPIRATORY_TRACT | Qty: 1

## 2016-11-28 MED FILL — DOK 100 MG CAPSULE: 100 mg | ORAL | Qty: 1

## 2016-11-28 MED FILL — POLYETHYLENE GLYCOL 3350 17 GRAM (100 %) ORAL POWDER PACKET: 17 gram | ORAL | Qty: 1

## 2016-11-28 MED FILL — ATORVASTATIN 10 MG TAB: 10 mg | ORAL | Qty: 1

## 2016-11-28 MED FILL — FAMOTIDINE (PF) 20 MG/2 ML IV: 20 mg/2 mL | INTRAVENOUS | Qty: 2

## 2016-11-28 MED FILL — GABAPENTIN 300 MG CAP: 300 mg | ORAL | Qty: 1

## 2016-11-28 NOTE — Progress Notes (Signed)
Written and verbal discharge instructions reviewed with patient.  Patient confirmed understanding with adequate teach back.  IV removed per protocol.  Belongings with patient at time of discharge.

## 2016-11-28 NOTE — Progress Notes (Signed)
Bedside and Verbal shift change report given to Anna, RN (oncoming nurse) by Narayan Scull, RN (offgoing nurse). Report included the following information SBAR, Kardex, ED Summary, Intake/Output, MAR and Recent Results.

## 2016-11-28 NOTE — Progress Notes (Signed)
Progress Note    Patient: Alyssa [name] MRN: [id number]  SSN: [id number]    Date of Birth: [birth year]  Age: 36 y.o.  Sex: female      Admit Date: 11/24/2016  Hernia repair    Subjective:     No acute events overnight.  Pain better controlled today.  Tolerating GI lite diet without n/v.  Wants to go home.  Having BM's.      Objective:     Visit Vitals   ??? BP (!) 145/94 (BP 1 Location: Left arm, BP Patient Position: At rest)   ??? Pulse 74   ??? Temp 98.1 ??F (36.7 ??C)   ??? Resp 18   ??? Ht 5\' 3"  (1.6 m)   ??? Wt 263 lb (119.3 kg)   ??? SpO2 93%   ??? BMI 46.59 kg/m2       Temp (24hrs), Avg:98.5 ??F (36.9 ??C), Min:98.1 ??F (36.7 ??C), Max:98.7 ??F (37.1 ??C)        Physical Exam:    Gen:  NAD  Pulm:  CTAB  CV: RRR  Abd:  Soft, ND, appropriately tender.  Incisions c/d/i  Ext : warm, well perfused.        Recent Results (from the past 24 hour(s))   GLUCOSE, POC    Collection Time: 11/27/16  1:04 PM   Result Value Ref Range    Glucose (POC) 153 (H) 65 - 100 mg/dL    Performed by [place], POC    Collection Time: 11/27/16  3:48 PM   Result Value Ref Range    Glucose (POC) 214 (H) 65 - 100 mg/dL    Performed by Fredirick Maudlin    GLUCOSE, POC    Collection Time: 11/27/16  9:43 PM   Result Value Ref Range    Glucose (POC) 200 (H) 65 - 100 mg/dL    Performed by Rayna Sexton    GLUCOSE, POC    Collection Time: 11/28/16  6:48 AM   Result Value Ref Range    Glucose (POC) 175 (H) 65 - 100 mg/dL    Performed by Andrey Farmer          Assessment:     Hospital Problems  Date Reviewed: 11/12/2016          Codes Class Noted POA    Abdominal wall seroma ([place]) ICD-10-CM: T88.8XXA, T79.2XXA  ICD-9-CM: 998.13  11/24/2016 Unknown        Pelvic ascites ICD-10-CM: R18.8  ICD-9-CM: 789.59  11/24/2016 Unknown        * (Principal)Post-operative pain ICD-10-CM: G89.18  ICD-9-CM: 338.18  11/24/2016 Unknown        Nausea and vomiting ICD-10-CM: R11.2  ICD-9-CM: 787.01  11/24/2016 Unknown              Plan/Recommendations/Medical Decision Making:      - Plan for d/c home this afternoon   - f/u at previously scheduled appointment for post-op follow up.      Signed By: Hetty Blend, MD     November 28, 2016

## 2016-11-28 NOTE — Other (Deleted)
The Medical medical record notes an Abdominal Seroma.     Can this be further specified as:    => Postprocedural seroma (Complication) of a digestive system organ following a digestive system procedure requiring Admission, CT of the abd. NPO, IV analageics.      => Other explanation of clinical findings  => Clinically Undetermined (no explanation for clinical findings)    The medical record reflects the following clinical findings, treatment, and risk factors.    Risk Factors:  Post op hernia repair with mesh on Aug. 1  Clinical Indicators:  CT abd. Generally Surgery consulted follwing  Treatment: NPO, CT, IV Analgecis, slowly advancing diet.    Please clarify and document your clinical opinion in the progress notes and discharge summary including the definitive and/or presumptive diagnosis, (suspected or probable), related to the above clinical findings. Please include clinical findings supporting your diagnosis.    Thank you   Marylynn Pearson, BSN, RN, [facility], [place]  CDMP  [phone]

## 2016-11-28 NOTE — Discharge Summary (Signed)
Physician Discharge Summary     Patient ID:  Alyssa [name]  [id number]  36 y.o.  08-28-1980    Admit Date: 11/24/2016    Discharge Date:  11/28/2016    Admission Diagnoses: Post-operative pain;Post-operative pain    Discharge Diagnoses:  Principal Problem:    Post-operative pain (11/24/2016)    Active Problems:    Abdominal wall seroma ([place]) (11/24/2016)      Pelvic ascites (11/24/2016)      Nausea and vomiting (11/24/2016)    Constipation    Admission Condition: [place]    Discharge Condition: Good    Procedure(s):    None     Hospital Course:   The patient was admitted due to post-operative pain, nausea, vomiting and constipation after a laparoscopic ventral hernia repair.  A CT scan was obtained that showed a small seroma around her hernia mesh but no signs of infection.  This was felt to be expected post-operative changes.  She had no signs of hernia recurrence or obstruction.  She was rehydrated with IVF and treated for constipation.  Her nausea/vomiting improved as well as her pain control and at the time of discharge she was ambulating, tolerating a GI lite diet, her pain and nausea were controlled and she was felt to be appropriate for discharge.  I will see her back at her previously scheduled post-operative clinic appointment.      Consults: None    Significant Diagnostic Studies: CT A/P - as above.      Disposition: home    Patient Instructions:   Current Discharge Medication List      START taking these medications    Details   HYDROmorphone (DILAUDID) 2 mg tablet Take 1 Tab by mouth every four (4) hours as needed. Max Daily Amount: 12 mg.  Qty: 50 Tab, Refills: 0    Associated Diagnoses: Post-operative pain      polyethylene glycol (MIRALAX) 17 gram packet Take 1 Packet by mouth daily as needed. Indications: constipation  Qty: 20 Packet, Refills: 0         CONTINUE these medications which have CHANGED    Details   ondansetron (ZOFRAN ODT) 4 mg disintegrating tablet Take 1 Tab by mouth  every eight (8) hours as needed for Nausea.  Qty: 20 Tab, Refills: 0    Associated Diagnoses: Nausea and vomiting, intractability of vomiting not specified, unspecified vomiting type         CONTINUE these medications which have NOT CHANGED    Details   loratadine (CLARITIN) 10 mg tablet Take 10 mg by mouth daily as needed for Allergies.      famotidine (PEPCID) 40 mg tablet Take 40 mg by mouth daily as needed.      gabapentin (NEURONTIN) 300 mg capsule Take 300 mg by mouth three (3) times daily.      albuterol (PROVENTIL HFA, VENTOLIN HFA, PROAIR HFA) 90 mcg/actuation inhaler Take 2 Puffs by inhalation every four (4) hours as needed for Wheezing.  Qty: 1 Inhaler, Refills: 2    Associated Diagnoses: Mild intermittent reactive airway disease with acute exacerbation      docusate sodium (COLACE) 100 mg capsule Take 1 Cap by mouth two (2) times a day for 90 days.  Qty: 60 Cap, Refills: 2    Associated Diagnoses: Incisional hernia, without obstruction or gangrene      valACYclovir (VALTREX) 1 gram tablet Take 1 Tab by mouth two (2) times a day.  Qty: 28 Tab, Refills: 0  JARDIANCE 25 mg tablet TAKE 1 TABLET BY MOUTH EVERY DAY  Qty: 30 Tab, Refills: 4    Associated Diagnoses: Type 2 diabetes mellitus with complication, without long-term current use of insulin (HCC)      ibuprofen (MOTRIN) 600 mg tablet TAKE 1 TABLET BY MOUTH EVERY 8 HOURS AS NEEDED FOR PAIN  Qty: 60 Tab, Refills: 1    Associated Diagnoses: S/P hysterectomy; Abdominal pain, unspecified abdominal location; Pain at surgical incision      glipiZIDE SR (GLUCOTROL XL) 10 mg CR tablet Take 1 Tab by mouth daily.  Qty: 30 Tab, Refills: 3      SITagliptin-metFORMIN (JANUMET XR) 50-1,000 mg TM24 Take 2 Tabs by mouth daily.  Qty: 60 Tab, Refills: 3      lisinopril (PRINIVIL, ZESTRIL) 10 mg tablet TAKE 1 TABLET BY MOUTH EVERY DAY  Qty: 30 Tab, Refills: 3    Associated Diagnoses: Essential hypertension       atorvastatin (LIPITOR) 10 mg tablet Take 1 Tab by mouth daily.  Qty: 90 Tab, Refills: 1      fluticasone furoate (ARNUITY ELLIPTA) 200 mcg/actuation dsdv inhaler Take 1 Puff by inhalation daily.  Qty: 1 Inhaler, Refills: 5    Associated Diagnoses: Mild intermittent reactive airway disease with acute exacerbation      tolterodine ER (DETROL-LA) 2 mg ER capsule TAKE ONE CAPSULE BY MOUTH EVERY DAY  Refills: 3         STOP taking these medications       HYDROcodone-acetaminophen (NORCO) 5-325 mg per tablet Comments:   Reason for Stopping:               Activity: No heavy lifting or strenuous activity for 4-6 weeks after surgery.   Diet: Diabetic Diet  Wound Care: None needed    Follow-up with Hetty Blend, MD in a few day(s) at your previously scheduled follow up appointment.   Follow-up tests/labs - None    Signed:  Hetty Blend, MD  11/28/2016  9:27 AM

## 2016-11-28 NOTE — Discharge Summary (Signed)
Physician Discharge Summary     Patient ID:  Alyssa [name]  [id number]  36 y.o.  21-Jul-1980    Admit Date: 11/24/2016    Discharge Date:  11/28/2016    Admission Diagnoses: Post-operative pain;Post-operative pain    Discharge Diagnoses:  Principal Problem:    Post-operative pain (11/24/2016)    Active Problems:    Abdominal wall seroma ([place]) (11/24/2016)      Pelvic ascites (11/24/2016)      Nausea and vomiting (11/24/2016)    Constipation    Admission Condition: [place]    Discharge Condition: Good    Procedure(s):    None     Hospital Course:   The patient was admitted due to post-operative pain, nausea, vomiting and constipation after a laparoscopic ventral hernia repair.  A CT scan was obtained that showed a small seroma around her hernia mesh but no signs of infection.  This was felt to be expected post-operative changes.  She had no signs of hernia recurrence or obstruction.  She was rehydrated with IVF and treated for constipation.  Her nausea/vomiting improved as well as her pain control and at the time of discharge she was ambulating, tolerating a GI lite diet, her pain and nausea were controlled and she was felt to be appropriate for discharge.  I will see her back at her previously scheduled post-operative clinic appointment.      Consults: None    Significant Diagnostic Studies: CT A/P - as above.      Disposition: home    Patient Instructions:   Current Discharge Medication List      START taking these medications    Details   HYDROmorphone (DILAUDID) 2 mg tablet Take 1 Tab by mouth every four (4) hours as needed. Max Daily Amount: 12 mg.  Qty: 50 Tab, Refills: 0    Associated Diagnoses: Post-operative pain      polyethylene glycol (MIRALAX) 17 gram packet Take 1 Packet by mouth daily as needed. Indications: constipation  Qty: 20 Packet, Refills: 0         CONTINUE these medications which have CHANGED    Details   ondansetron (ZOFRAN ODT) 4 mg disintegrating tablet Take 1 Tab by mouth every eight (8) hours as needed  for Nausea.  Qty: 20 Tab, Refills: 0    Associated Diagnoses: Nausea and vomiting, intractability of vomiting not specified, unspecified vomiting type         CONTINUE these medications which have NOT CHANGED    Details   loratadine (CLARITIN) 10 mg tablet Take 10 mg by mouth daily as needed for Allergies.      famotidine (PEPCID) 40 mg tablet Take 40 mg by mouth daily as needed.      gabapentin (NEURONTIN) 300 mg capsule Take 300 mg by mouth three (3) times daily.      albuterol (PROVENTIL HFA, VENTOLIN HFA, PROAIR HFA) 90 mcg/actuation inhaler Take 2 Puffs by inhalation every four (4) hours as needed for Wheezing.  Qty: 1 Inhaler, Refills: 2    Associated Diagnoses: Mild intermittent reactive airway disease with acute exacerbation      docusate sodium (COLACE) 100 mg capsule Take 1 Cap by mouth two (2) times a day for 90 days.  Qty: 60 Cap, Refills: 2    Associated Diagnoses: Incisional hernia, without obstruction or gangrene      valACYclovir (VALTREX) 1 gram tablet Take 1 Tab by mouth two (2) times a day.  Qty: 28 Tab, Refills: 0  JARDIANCE 25 mg tablet TAKE 1 TABLET BY MOUTH EVERY DAY  Qty: 30 Tab, Refills: 4    Associated Diagnoses: Type 2 diabetes mellitus with complication, without long-term current use of insulin (HCC)      ibuprofen (MOTRIN) 600 mg tablet TAKE 1 TABLET BY MOUTH EVERY 8 HOURS AS NEEDED FOR PAIN  Qty: 60 Tab, Refills: 1    Associated Diagnoses: S/P hysterectomy; Abdominal pain, unspecified abdominal location; Pain at surgical incision      glipiZIDE SR (GLUCOTROL XL) 10 mg CR tablet Take 1 Tab by mouth daily.  Qty: 30 Tab, Refills: 3      SITagliptin-metFORMIN (JANUMET XR) 50-1,000 mg TM24 Take 2 Tabs by mouth daily.  Qty: 60 Tab, Refills: 3      lisinopril (PRINIVIL, ZESTRIL) 10 mg tablet TAKE 1 TABLET BY MOUTH EVERY DAY  Qty: 30 Tab, Refills: 3    Associated Diagnoses: Essential hypertension      atorvastatin (LIPITOR) 10 mg tablet Take 1 Tab by mouth daily.  Qty: 90 Tab, Refills: 1       fluticasone furoate (ARNUITY ELLIPTA) 200 mcg/actuation dsdv inhaler Take 1 Puff by inhalation daily.  Qty: 1 Inhaler, Refills: 5    Associated Diagnoses: Mild intermittent reactive airway disease with acute exacerbation      tolterodine ER (DETROL-LA) 2 mg ER capsule TAKE ONE CAPSULE BY MOUTH EVERY DAY  Refills: 3         STOP taking these medications       HYDROcodone-acetaminophen (NORCO) 5-325 mg per tablet Comments:   Reason for Stopping:               Activity: No heavy lifting or strenuous activity for 4-6 weeks after surgery.   Diet: Diabetic Diet  Wound Care: None needed    Follow-up with Hetty Blend, MD in a few day(s) at your previously scheduled follow up appointment.   Follow-up tests/labs - None    Signed:  Hetty Blend, MD  11/28/2016  9:27 AM

## 2016-11-29 NOTE — Telephone Encounter (Signed)
Spoke with patient and advised she will need to contact her surgeons office Dr. Dema Severin regarding Rx for Dilaudid since he was the prescribing doctor.

## 2016-11-29 NOTE — Telephone Encounter (Signed)
-----   Message from Shaune Pollack sent at 11/28/2016  6:55 PM EDT -----  Regarding: Dr Crissie Reese  Pt states that she was prescribed Hydrocodone on 11/16/16, after surgery, but it is not working, had complications from surgery, was prescribed Hydromorphone but needs Prior Authorization.    Best contact # [phone].

## 2016-12-01 ENCOUNTER — Encounter: Primary: Family Medicine

## 2016-12-01 ENCOUNTER — Encounter: Attending: Surgery | Primary: Family Medicine

## 2016-12-07 NOTE — Telephone Encounter (Signed)
Patient stated she has used prune juice an miralax and had not had a BM for 2 days stated she finally went to the bathroom last night. Advised patient to continue miralax 3x a day.

## 2016-12-08 ENCOUNTER — Ambulatory Visit
Admit: 2016-12-08 | Discharge: 2016-12-08 | Payer: PRIVATE HEALTH INSURANCE | Attending: Surgery | Primary: Family Medicine

## 2016-12-08 DIAGNOSIS — K432 Incisional hernia without obstruction or gangrene: Secondary | ICD-10-CM

## 2016-12-08 NOTE — Progress Notes (Signed)
1. Have you been to the ER, urgent care clinic since your last visit?  Hospitalized since your last visit? Yes, [facility] 11/24/16 post op pain/constipation     2. Have you seen or consulted any other health care providers outside of the [facility] since your last visit?  Include any pap smears or colon screening. No

## 2016-12-08 NOTE — Progress Notes (Signed)
[facility]      Clinic Note - Follow up    Subjective     Alyssa [name] returns for scheduled follow up today.  She is s/p laparoscopic repair of ventral incisional hernia and lysis of adhesions on 11/16/16.  She was readmitted post-operatively due to constipation and n/v and was discharged again on 8/13.  She is now feeling better.  She still has some pain but her constipation is improved on miralax.  She denies any n/v now.  She states she still cannot lay flat due to pain but this is also improving.  She states she is not ready to go back to work at this point.      Objective     Visit Vitals   ??? BP 136/80 (BP 1 Location: Right arm, BP Patient Position: Sitting)   ??? Pulse 96   ??? Temp 98.3 ??F (36.8 ??C) (Oral)   ??? Resp 20   ??? Ht 5\' 3"  (1.6 m)   ??? Wt 263 lb (119.3 kg)   ??? LMP 03/08/2016  Comment: has heavy periods per patient   ??? SpO2 97%   ??? BMI 46.59 kg/m2         PE  GEN - Awake, alert, communicating appropriately.  NAD  Pulm - CTAB  CV - RRR  Abd - soft, ND, appropriately tender.  Incisions c/d/i.  No evidence of hernia recurrence or infection.   Ext - warm, well perfused.     Labs  None    Assessment     Alyssa [name] is a 36 y.o.yr old female s/p laparoscopic repair of ventral incisional hernia and lysis of adhesions on 11/16/16.  She was readmitted post-operatively on 8/9 due to constipation and n/v and was discharged again on 8/13.  She is doing better now.      Plan     I will plan to see her back in 2 more weeks to ensure she continues to improve and to see if she is able to go back to work at that time.  She will avoid any strenuous activity or heavy lifting until that time.  I have asked her to start doing some stretching exercises and walking to help reduce her stiffness and help improve her pain.  She should continue the miralax now to avoid constipation.      Hetty Blend, MD  12/08/16     CC: [name], MD

## 2016-12-12 ENCOUNTER — Inpatient Hospital Stay: Admit: 2016-12-12 | Payer: MEDICAID | Primary: Family Medicine

## 2016-12-12 ENCOUNTER — Ambulatory Visit
Admit: 2016-12-12 | Discharge: 2016-12-12 | Payer: PRIVATE HEALTH INSURANCE | Attending: Family | Primary: Family Medicine

## 2016-12-12 ENCOUNTER — Encounter: Attending: Obstetrics & Gynecology | Primary: Family Medicine

## 2016-12-12 DIAGNOSIS — Z09 Encounter for follow-up examination after completed treatment for conditions other than malignant neoplasm: Secondary | ICD-10-CM

## 2016-12-12 MED ORDER — PROMETHAZINE 12.5 MG TAB
12.5 mg | ORAL_TABLET | Freq: Four times a day (QID) | ORAL | 1 refills | Status: DC | PRN
Start: 2016-12-12 — End: 2016-12-12

## 2016-12-12 MED ORDER — PROMETHAZINE 25 MG TAB
25 mg | ORAL_TABLET | Freq: Four times a day (QID) | ORAL | 0 refills | Status: DC | PRN
Start: 2016-12-12 — End: 2017-02-01

## 2016-12-12 NOTE — Telephone Encounter (Signed)
Returned patients call.  HIPPA verified. Patient states she has been vomiting since 3am. Patient stated she isn't able to keep fluid or food down. Offered patient an appointment for today at 3pm.  Patient expressed thanks and understanding.

## 2016-12-12 NOTE — Progress Notes (Signed)
1. Have you been to the ER, urgent care clinic since your last visit?  Hospitalized since your last visit?No    2. Have you seen or consulted any other health care providers outside of the [facility] since your last visit?  Include any pap smears or colon screening. No

## 2016-12-12 NOTE — Progress Notes (Signed)
Subjective:    Britanee Vanblarcom is a 36 y.o. female presents for postop care 9 days following ventral hernia repair by Dr. Dema Severin. Appetite is poor. She was started to become nauseated yesterday and started to vomit at 3 am. Zofran is not helpful; last dose at 12pm. Had a small and very had BM today and last one before that was 2 days ago and it was "large." Taking miralax daily. Not taking dilaudid. Last took a tylenol PM last night at 11pm.  Pain is a level 4 and pt contributes it to vomitting. Denies fever, nausea, shortness of breath, chest pain, redness at incision site, vomiting and diarrhea   Pt presented to the ED 8 days post op for N&V and was admitted for IVF and anti-emetics. CT scan was negative for SBO and hernia recurrence but showed post op seroma.    Advance directive not on file      Objective:     Visit Vitals   ??? BP (!) 128/98 (BP 1 Location: Left arm, BP Patient Position: Sitting)   ??? Pulse 91   ??? Temp 99 ??F (37.2 ??C) (Oral)   ??? Resp 18   ??? Ht 5\' 3"  (1.6 m)   ??? Wt 252 lb 8 oz (114.5 kg)   ??? LMP 03/08/2016  Comment: has heavy periods per patient   ??? SpO2 94%   ??? BMI 44.73 kg/m2       General:  alert, no distress, holding blue bag, gagged but no vomit   Abdomen: Soft and obese, very hypoactive bowel sounds, +firmness noted at center of abd - ?seroma   Incision:   healing well, no drainage, no erythema, no seroma, no swelling, no dehiscence, incisions well approximated   Heart: regular rate and rhythm, S1, S2 normal, no murmur, click, rub or gallop   Lungs: clear to auscultation bilaterally       Assessment:     ventral hernia status post repair.   N&V    Plan:     1. KUB now    KUB Results:  INDICATION: N and V s/p ventral hernia repair  Encounter for follow-up  examination after completed treatment for conditions other than malignant  neoplasm  ??  A single frontal view of the abdomen shows normal small bowel gas pattern. There  is mild diffuse colorectal stool without colorectal distention. There are  no  significant calcifications. There is no apparent organomegaly.  ??  IMPRESSION  IMPRESSION: Normal KUB.    Pt reports that she feels slightly better.  REcommend clear fluids until she feels better. Gatorade, broths, etc.  Will give rx for phenergan to see if that works better than zofran  Pt to take magnesium citrate today  Will ask Dr. Orest Dikes nurse to give patient a call tomorrow to check in  Notified Dr. Dema Severin  Pt to keep already scheduled appointment with Dr. Dema Severin for next week    Ms. Bless has a reminder for a "due or due soon" health maintenance. I have asked that she contact her primary care provider for follow-up on this health maintenance.    Patient verbalized understanding and agreement.

## 2016-12-13 NOTE — Telephone Encounter (Signed)
-----   Message from Derrek Monaco, NP sent at 12/12/2016  5:31 PM EDT -----  Regarding: f/u phone call  Emmie Niemann,   Will you please give this patient a call tomorrow (Tuesday) to check how she is doing? Dr. Dema Severin is aware.    Thank you,  Eugene Garnet

## 2016-12-13 NOTE — Telephone Encounter (Signed)
Called patient.  HIPPA verified. Patient denies n/v states the phenergan helped. Patient is still experiencing some soreness to which she attributes to the amount of vomiting she had been doing.  PO-water, broth, and mag citrate. Patient states she had a very small bm this morning. Advised patient to eat soft foods and avoid spicy, heavy foods which may further upset her stomach and to call the office if symptoms return or worsen.  Patient expressed understanding.

## 2016-12-20 ENCOUNTER — Encounter

## 2016-12-21 ENCOUNTER — Ambulatory Visit

## 2016-12-21 ENCOUNTER — Inpatient Hospital Stay: Admit: 2016-12-21 | Payer: PRIVATE HEALTH INSURANCE | Attending: Surgery | Primary: Family Medicine

## 2016-12-21 DIAGNOSIS — R188 Other ascites: Secondary | ICD-10-CM

## 2016-12-21 MED ORDER — SODIUM CHLORIDE 0.9% BOLUS IV
0.9 % | Freq: Once | INTRAVENOUS | Status: AC
Start: 2016-12-21 — End: 2016-12-21
  Administered 2016-12-21: 18:00:00 via INTRAVENOUS

## 2016-12-21 MED ORDER — IOHEXOL 240 MG/ML IV SOLN
240 mg iodine/mL | Freq: Once | INTRAVENOUS | Status: AC
Start: 2016-12-21 — End: 2016-12-21
  Administered 2016-12-21: 18:00:00 via ORAL

## 2016-12-21 MED ORDER — SODIUM CHLORIDE 0.9 % IJ SYRG
Freq: Once | INTRAMUSCULAR | Status: AC
Start: 2016-12-21 — End: 2016-12-21
  Administered 2016-12-21: 18:00:00 via INTRAVENOUS

## 2016-12-21 MED ORDER — IOPAMIDOL 76 % IV SOLN
370 mg iodine /mL (76 %) | Freq: Once | INTRAVENOUS | Status: AC
Start: 2016-12-21 — End: 2016-12-21
  Administered 2016-12-21: 18:00:00 via INTRAVENOUS

## 2016-12-21 MED FILL — SODIUM CHLORIDE 0.9 % IV: INTRAVENOUS | Qty: 100

## 2016-12-21 MED FILL — NORMAL SALINE FLUSH 0.9 % INJECTION SYRINGE: INTRAMUSCULAR | Qty: 10

## 2016-12-21 MED FILL — OMNIPAQUE 240 MG IODINE/ML INTRAVENOUS SOLUTION: 240 mg iodine/mL | INTRAVENOUS | Qty: 50

## 2016-12-21 MED FILL — ISOVUE-370  76 % INTRAVENOUS SOLUTION: 370 mg iodine /mL (76 %) | INTRAVENOUS | Qty: 100

## 2016-12-22 ENCOUNTER — Ambulatory Visit
Admit: 2016-12-22 | Discharge: 2016-12-22 | Payer: PRIVATE HEALTH INSURANCE | Attending: Surgery | Primary: Family Medicine

## 2016-12-22 DIAGNOSIS — K439 Ventral hernia without obstruction or gangrene: Secondary | ICD-10-CM

## 2016-12-22 NOTE — Progress Notes (Signed)
[facility]      Clinic Note - Follow up    Subjective     Alyssa [name] returns for scheduled follow up today. She is s/p laparoscopic repair of ventral incisional hernia and lysis of adhesions on 11/16/16.  She was readmitted post-operatively due to constipation and n/v and was discharged again on 8/13.  She had a repeat CT due to her ongoing pain that shows a continued post-operative seroma above her hernia repair mesh that does not appear to be infected.  There is a small amount of free fluid in the pelvis that appears unchanged from her prior CT.  She continues to have some pain but states that it is slowly improving.  She is still sleeping in a chair since she can't lay flat and has pain when standing more than 30 minutes.  She rates her pain as a 2-3/10.  She denies any fevers, n/v, constipation or diarrhea.  She is using extra strength tylenol for pain control.     Objective     Visit Vitals   ??? BP 128/76 (BP 1 Location: Left arm, BP Patient Position: Sitting)   ??? Pulse 91   ??? Temp 97.9 ??F (36.6 ??C) (Oral)   ??? Resp 20   ??? Ht 5\' 3"  (1.6 m)   ??? Wt 252 lb (114.3 kg)   ??? LMP 03/08/2016  Comment: has heavy periods per patient   ??? SpO2 97%   ??? BMI 44.64 kg/m2         PE  GEN - Awake, alert, communicating appropriately.  NAD  Pulm - CTAB  CV - RRR  Abd - soft, ND.  Some tenderness around hernia repair. No erythema or signs of infection.  Incisions c/d/i.  Hernia repair seems intact.    Ext - warm, well perfused, no edema.      Labs  None    Imaging  12/21/16 CT A/P:  IMPRESSION:  ??  1. Noncomplex fluid again noted in the anterior abdominal wall which may be  seromatous fluid within the abdominal mesh. This has not changed nor  demonstrates inflammatory or infected findings.  2. There is ascites in the pelvis which has not changed in amount or  distribution since the previous study.      Assessment     Alyssa [name] is a 36 y.o.yr old female with persistent pain after  laparoscopic ventral hernia repair.  She has a post-operative seroma that appears non-infected.      Plan     I discussed with the patient options of continued observation vs aspiration of the seroma.  The risk with aspiration is causing an infection of her seroma and mesh which would lead to possible more complications.  She would like to give this more time to resolve.  I will plan to see her back in 4 more weeks for additional follow up.  If she is still symptomatic at that time, I will plan to aspirate her seroma.      Hetty Blend, MD  12/22/16     CC: Alyssa Champagne, MD

## 2016-12-22 NOTE — Progress Notes (Signed)
1. Have you been to the ER, urgent care clinic since your last visit?  Hospitalized since your last visit?No    2. Have you seen or consulted any other health care providers outside of the [facility] since your last visit?  Include any pap smears or colon screening. No

## 2016-12-29 ENCOUNTER — Encounter

## 2016-12-30 MED ORDER — IBUPROFEN 600 MG TAB
600 mg | ORAL_TABLET | ORAL | 1 refills | Status: DC
Start: 2016-12-30 — End: 2017-03-07

## 2016-12-30 MED ORDER — GABAPENTIN 300 MG CAP
300 mg | ORAL_CAPSULE | ORAL | 1 refills | Status: DC
Start: 2016-12-30 — End: 2017-02-01

## 2017-01-06 NOTE — Telephone Encounter (Signed)
Called patient. HIPPA verified.  Patient stated she is feeling better.  Patient stated she has an appointment with Dr. Dema Severin on 01/21/17, told patient that was a Saturday and she didn't have a scheduled foloow up appointment.  Patient given an appointment for 01/17/17 at 1:435pm for a follow-up visit. Patient expressed understanding.  Also, told patient I am in receipt of her [facility] paperwork.

## 2017-01-07 ENCOUNTER — Encounter

## 2017-01-08 MED ORDER — ATORVASTATIN 10 MG TAB
10 mg | ORAL_TABLET | ORAL | 0 refills | Status: DC
Start: 2017-01-08 — End: 2017-03-07

## 2017-01-09 MED ORDER — FAMOTIDINE 40 MG TAB
40 mg | ORAL_TABLET | ORAL | 4 refills | Status: DC
Start: 2017-01-09 — End: 2017-03-07

## 2017-01-09 NOTE — Telephone Encounter (Signed)
Pt calling to make sure you received her fmla/disability  paperwork

## 2017-01-09 NOTE — Telephone Encounter (Signed)
Returned patients call.  HIPPA verified.  Patient was calling to see if her [facility] paperwork was complete.  Advised patient paperwork was completed and faxed to [facility] and original mailed back to patient.  Patient expressed understanding.

## 2017-01-09 NOTE — Progress Notes (Signed)
Received and completed [facility] paperwork for patient.  Some spaces haven't been completed by patient/employer. Will fax to [facility] and place original in the mail back to patient and a copy to be scanned into the patients chart.

## 2017-01-16 MED ORDER — BLOOD SUGAR DIAGNOSTIC TEST STRIPS
ORAL_STRIP | 5 refills | Status: DC
Start: 2017-01-16 — End: 2017-09-01

## 2017-01-16 MED ORDER — BLOOD GLUCOSE METER KIT
PACK | 0 refills | Status: DC
Start: 2017-01-16 — End: 2017-09-01

## 2017-01-17 ENCOUNTER — Encounter: Attending: Surgery | Primary: Family Medicine

## 2017-01-18 NOTE — Progress Notes (Signed)
Received update request from [facility].  Completed form and faxed back.

## 2017-01-19 ENCOUNTER — Ambulatory Visit
Admit: 2017-01-19 | Discharge: 2017-01-19 | Payer: PRIVATE HEALTH INSURANCE | Attending: Surgery | Primary: Family Medicine

## 2017-01-19 DIAGNOSIS — K432 Incisional hernia without obstruction or gangrene: Secondary | ICD-10-CM

## 2017-01-19 NOTE — Progress Notes (Signed)
Received FMLA paperwork from patient.  Explained to patient I would have the paperwork completed no later than Monday.

## 2017-01-19 NOTE — Progress Notes (Signed)
1. Have you been to the ER, urgent care clinic since your last visit?  Hospitalized since your last visit?No    2. Have you seen or consulted any other health care providers outside of the  [facility] since your last visit?  Include any pap smears or colon screening. No

## 2017-01-19 NOTE — Progress Notes (Signed)
[facility]      Clinic Note - Follow up    Subjective     Susana Duell returns for scheduled follow up today.  She is s/p laparoscopic repair of ventral incisional hernia and lysis of adhesions on 11/16/16. ??She was readmitted post-operatively due to constipation and n/v and was discharged again on 8/13.  She had a repeat CT due to her ongoing pain that showed a continued post-operative seroma above her hernia repair mesh that does not appear to be infected.  Today she returns and feels much better.  She is off all meds at this time and can lay flat.  She is tolerating a diet and having normal bowel function. She wants to return to work next week.      Objective     Visit Vitals   ??? BP 128/80 (BP 1 Location: Left arm, BP Patient Position: Sitting)   ??? Pulse 79   ??? Temp 98.5 ??F (36.9 ??C) (Oral)   ??? Resp 18   ??? Ht 5\' 3"  (1.6 m)   ??? Wt 253 lb (114.8 kg)   ??? LMP 03/08/2016  Comment: has heavy periods per patient   ??? SpO2 98%   ??? BMI 44.82 kg/m2         PE  GEN - Awake, alert, communicating appropriately.  NAD  Pulm - CTAB  CV - RRR  Abd - soft, NT, ND.  No erythema or signs of infection.  No evidence of recurrent hernia.    Ext - warm, well perfused.      Labs  None    Assessment     Jolyssa Oplinger is a 36 y.o.yr old female s/p laparoscopic repair of ventral incisional hernia and lysis of adhesions on 11/16/16.  She has been slow to recover from this but is finally feeling better.      Plan     The patient is cleared to return to work and activity as tolerated.  I will see her back as needed in the future.      Hetty Blend, MD  01/19/17    CC: [name], MD

## 2017-02-01 ENCOUNTER — Ambulatory Visit
Admit: 2017-02-01 | Discharge: 2017-02-01 | Payer: PRIVATE HEALTH INSURANCE | Attending: Internal Medicine | Primary: Family Medicine

## 2017-02-01 DIAGNOSIS — N644 Mastodynia: Secondary | ICD-10-CM
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
## 2017-02-10 NOTE — Telephone Encounter (Signed)
Patient is requesting to pick up a confirmation letter or note that she received her flu vaccine. Patient works for [employer] and states they need to know the flu vaccine lot # , what arm it wad administered into , at what time and by who.   Patient will pick up this information on Monday states  She can not work with out it.  Patient can be reached at [phone]

## 2017-02-13 NOTE — Telephone Encounter (Signed)
Per nurse, she has faxed the requested information to the number provided. Patient informed.

## 2017-02-13 NOTE — Telephone Encounter (Signed)
-----   Message from Sanjuan Dame sent at 02/13/2017  2:18 PM EDT -----  Regarding: Dr.Ravussin/Telephone  Pt is requesting details regarding her Flu shot (Lot number etc) be faxed to [phone]. Pt advised that she will not be able to continue employment without this information. Pt advised that it is not listed on visit summary she obtained on 02/01/17.    [employer] YIRSWNI:[phone]

## 2017-02-13 NOTE — Telephone Encounter (Signed)
Left v.m advising vaccination record will be at the front desk for pick up.

## 2017-03-07 ENCOUNTER — Emergency Department: Admit: 2017-03-07 | Payer: PRIVATE HEALTH INSURANCE | Primary: Family Medicine

## 2017-03-07 ENCOUNTER — Inpatient Hospital Stay
Admit: 2017-03-07 | Discharge: 2017-03-07 | Disposition: A | Discharge status: Home / Self Care | Payer: PRIVATE HEALTH INSURANCE | Attending: Emergency Medicine

## 2017-03-07 DIAGNOSIS — J111 Influenza due to unidentified influenza virus with other respiratory manifestations: Secondary | ICD-10-CM

## 2017-03-07 LAB — GLUCOSE, POC: Glucose (POC): 180 mg/dL — ABNORMAL HIGH (ref 65–100)

## 2017-03-07 MED ORDER — IBUPROFEN 600 MG TAB
600 mg | ORAL | Status: AC
Start: 2017-03-07 — End: 2017-03-07
  Administered 2017-03-07: 16:00:00 via ORAL

## 2017-03-07 MED ORDER — ALBUTEROL SULFATE 0.083 % (0.83 MG/ML) SOLN FOR INHALATION
2.5 mg /3 mL (0.083 %) | RESPIRATORY_TRACT | Status: AC
Start: 2017-03-07 — End: 2017-03-07
  Administered 2017-03-07: 16:00:00 via RESPIRATORY_TRACT

## 2017-03-07 MED ORDER — ALBUTEROL SULFATE HFA 90 MCG/ACTUATION AEROSOL INHALER
90 mcg/actuation | RESPIRATORY_TRACT | 0 refills | Status: DC | PRN
Start: 2017-03-07 — End: 2018-03-26

## 2017-03-07 MED ORDER — INHALATIONAL SPACING DEVICE
0 refills | Status: DC | PRN
Start: 2017-03-07 — End: 2018-04-23

## 2017-03-07 MED FILL — IBUPROFEN 600 MG TAB: 600 mg | ORAL | Qty: 1

## 2017-03-07 MED FILL — ALBUTEROL SULFATE 0.083 % (0.83 MG/ML) SOLN FOR INHALATION: 2.5 mg /3 mL (0.083 %) | RESPIRATORY_TRACT | Qty: 1

## 2017-03-07 NOTE — ED Notes (Signed)
Peak Flow prior to neb admin = 425.
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
## 2017-03-07 NOTE — ED Notes (Signed)
Dr. Lynnae January has reviewed discharge instructions with patient and self including prescription(s) if applicable. Patient has received and verbalizes understanding of all instructions and has no further questions at this time. Patient exits ED via ambulatory accompanied by self. Patient in no acute distress.

## 2017-03-07 NOTE — ED Notes (Signed)
Peak Flow reading after neb tx admin = 425.

## 2017-03-07 NOTE — ED Triage Notes (Signed)
Patient sent her from [facility]. She was seen there recently and diagnosed with sinusitis. She went back today because she wasn't feeling better and they sent her here. She reports cough, hoarse voice, nasal congestion, fever. She has not been tested for flu, did receive a flu shot.

## 2017-04-13 MED ORDER — ATORVASTATIN 10 MG TAB
10 mg | ORAL_TABLET | ORAL | 0 refills | Status: DC
Start: 2017-04-13 — End: 2017-07-17

## 2017-04-27 ENCOUNTER — Ambulatory Visit
Admit: 2017-04-27 | Discharge: 2017-04-27 | Payer: PRIVATE HEALTH INSURANCE | Attending: Obstetrics & Gynecology | Primary: Family Medicine

## 2017-04-27 ENCOUNTER — Encounter: Attending: Obstetrics & Gynecology | Primary: Family Medicine

## 2017-04-27 DIAGNOSIS — M545 Low back pain, unspecified: Secondary | ICD-10-CM

## 2017-04-27 LAB — AMB POC URINALYSIS DIP STICK AUTO W/ MICRO
Bilirubin (UA POC): NEGATIVE
Blood (UA POC): NEGATIVE
Ketones (UA POC): NEGATIVE
Leukocyte esterase (UA POC): NEGATIVE
Nitrites (UA POC): NEGATIVE
Specific gravity (UA POC): 1.02 (ref 1.001–1.035)
Urobilinogen (UA POC): NORMAL (ref 0.2–1)
pH (UA POC): 6 (ref 4.6–8.0)

## 2017-04-27 MED ORDER — FLUCONAZOLE 100 MG TAB
100 mg | ORAL_TABLET | Freq: Every day | ORAL | 0 refills | Status: AC
Start: 2017-04-27 — End: 2017-05-07

## 2017-04-27 MED ORDER — NYSTATIN-TRIAMCINOLONE 100,000 UNIT/G-0.1 % OINTMENT
100000-0.1 unit/gram-% | Freq: Three times a day (TID) | CUTANEOUS | 1 refills | Status: DC
Start: 2017-04-27 — End: 2017-07-17

## 2017-04-27 NOTE — Addendum Note (Signed)
Addended by: Brett Canales on: 04/27/2017 11:31 AM     Modules accepted: Orders
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
## 2017-04-29 LAB — CULTURE, URINE
Urine Culture, Routine: NO GROWTH
Urine Culture, Routine: NO GROWTH

## 2017-06-16 ENCOUNTER — Encounter: Attending: Family Medicine | Primary: Family Medicine

## 2017-06-16 NOTE — Telephone Encounter (Signed)
Called pt regarding same day appointment scheduled for today at 4:15 pm, but was unable to reach her. Left message for pt to call office as we normally ask that pt arrive early if they've not been seen at this location before. Ldm

## 2017-07-17 ENCOUNTER — Ambulatory Visit
Admit: 2017-07-17 | Discharge: 2017-07-17 | Payer: PRIVATE HEALTH INSURANCE | Attending: Obstetrics & Gynecology | Primary: Family Medicine

## 2017-07-17 DIAGNOSIS — N9089 Other specified noninflammatory disorders of vulva and perineum: Secondary | ICD-10-CM

## 2017-07-17 LAB — AMB POC URINALYSIS DIP STICK AUTO W/O MICRO
Bilirubin (UA POC): NEGATIVE
Blood (UA POC): NEGATIVE
Leukocyte esterase (UA POC): NEGATIVE
Nitrites (UA POC): NEGATIVE
Urobilinogen (UA POC): NORMAL (ref 0.2–1)

## 2017-07-17 MED ORDER — FLUCONAZOLE 100 MG TAB
100 mg | ORAL_TABLET | Freq: Every day | ORAL | 0 refills | Status: AC
Start: 2017-07-17 — End: 2017-07-27

## 2017-07-17 MED ORDER — NYSTATIN-TRIAMCINOLONE 100,000 UNIT/G-0.1 % OINTMENT
100000-0.1 unit/gram-% | Freq: Three times a day (TID) | CUTANEOUS | 1 refills | Status: DC
Start: 2017-07-17 — End: 2017-11-28

## 2017-07-17 NOTE — Progress Notes (Signed)
Her cultures show yeast which was treated at her last visit. Also shows herpes. She can take Valtrex 1000 mg BID for 7 days

## 2017-07-17 NOTE — Progress Notes (Signed)
Patient advised of MD reviewed labs and recommendations.Prescription sent as per MD order to patient preferred pharmacy. Patient verbalized understanding.

## 2017-07-17 NOTE — Progress Notes (Signed)
This nurse attempted to reach the patient and left a detailed message for the patient to call the office back regarding her labs.
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
## 2017-07-20 LAB — NUSWAB VAGINITIS PLUS
C. albicans, NAA: POSITIVE — AB
C. glabrata, NAA: NEGATIVE
C. trachomatis, NAA: NEGATIVE
N. gonorrhoeae, NAA: NEGATIVE
T. vaginalis, NAA: NEGATIVE

## 2017-07-20 LAB — HERPES SIMPLEX VIRUS (HSV) NAA
HSV 1, NAA: NEGATIVE
HSV 2, NAA: POSITIVE — AB

## 2017-07-20 MED ORDER — VALACYCLOVIR 1 G TAB
1 gram | ORAL_TABLET | ORAL | 0 refills | Status: DC
Start: 2017-07-20 — End: 2017-11-02

## 2017-07-20 NOTE — Telephone Encounter (Signed)
Call received at 76:75am    37 year old patient calling back regarding her lab work.    Patient advised of MD reviewed labs and recommendations.       Message   Received: Today   Message Contents   Syble Creek, MD  Khoury Siemon, Jerrye Bushy, RN      ??      Her cultures show yeast which was treated at her last visit. Also shows herpes. She can take Valtrex 1000 mg BID for 7 days      Prescription sent as per MD order to patient preferred pharmacy. Patient verbalized understanding.

## 2017-08-30 ENCOUNTER — Ambulatory Visit
Admit: 2017-08-30 | Discharge: 2017-08-30 | Payer: PRIVATE HEALTH INSURANCE | Attending: Family Medicine | Primary: Family Medicine

## 2017-08-30 DIAGNOSIS — E118 Type 2 diabetes mellitus with unspecified complications: Secondary | ICD-10-CM

## 2017-08-30 NOTE — Progress Notes (Signed)
Reviewed with patient at visit.

## 2017-08-30 NOTE — Progress Notes (Signed)
Chief Complaint   Patient presents with   ??? Establish Care     1. Have you been to the ER, urgent care clinic since your last visit?  Hospitalized since your last visit? No    2. Have you seen or consulted any other health care providers outside of the [facility] since your last visit?  Include any pap smears or colon screening. No
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
## 2017-08-30 NOTE — Patient Instructions (Addendum)
T2 DM: has not had labs drawn in about 10 m, need to restart by drawing labs, likely should not continue jardiance b/c of repeated yeast infections. Discussed importance of control, was previously on 4 oral agents without control which may have been d/t poor medication compliance, will need for patient to  Commit to diabetic diet and medication compliance. Cannot safely restart medications without labs, patient not fasting, will start with labs  HTN/DM: should restart Lisinopril if labs stable  Hx IDA: will check cbc  Desires STI screening: per orders  DIscussed need for weight loss, healthier diet, increasing exercise (ambulation at work not sufficient for recommended exercise)  See patient back in less than 1 week to ensure we can start appropriate treatment  May need to start insulin, discussed briefly with patient, this will be dependent on the a1c value and also other lab criteria

## 2017-09-01 ENCOUNTER — Ambulatory Visit
Admit: 2017-09-01 | Discharge: 2017-09-01 | Payer: PRIVATE HEALTH INSURANCE | Attending: Family Medicine | Primary: Family Medicine

## 2017-09-01 DIAGNOSIS — I10 Essential (primary) hypertension: Secondary | ICD-10-CM

## 2017-09-01 LAB — HIV 1/2 ANTIGEN/ANTIBODY, FOURTH GENERATION W/RFL: HIV Screen 4th Generation wRfx: NONREACTIVE

## 2017-09-01 LAB — RPR
RPR: NONREACTIVE
RPR: NONREACTIVE

## 2017-09-01 LAB — CBC W/O DIFF
HCT: 41.6 % (ref 34.0–46.6)
HGB: 13 g/dL (ref 11.1–15.9)
MCH: 26 pg — ABNORMAL LOW (ref 26.6–33.0)
MCHC: 31.3 g/dL — ABNORMAL LOW (ref 31.5–35.7)
MCV: 83 fL (ref 79–97)
PLATELET: 395 10*3/uL — ABNORMAL HIGH (ref 150–379)
RBC: 5 x10E6/uL (ref 3.77–5.28)
RDW: 16 % — ABNORMAL HIGH (ref 12.3–15.4)
WBC: 7.2 10*3/uL (ref 3.4–10.8)

## 2017-09-01 LAB — METABOLIC PANEL, COMPREHENSIVE
A-G Ratio: 1.2 (ref 1.2–2.2)
ALT (SGPT): 13 IU/L (ref 0–32)
AST (SGOT): 14 IU/L (ref 0–40)
Albumin: 4 g/dL (ref 3.5–5.5)
Alk. phosphatase: 64 IU/L (ref 39–117)
BUN/Creatinine ratio: 13 (ref 9–23)
BUN: 9 mg/dL (ref 6–20)
Bilirubin, total: 0.3 mg/dL (ref 0.0–1.2)
CO2: 19 mmol/L — ABNORMAL LOW (ref 20–29)
Calcium: 9.3 mg/dL (ref 8.7–10.2)
Chloride: 99 mmol/L (ref 96–106)
Creatinine: 0.68 mg/dL (ref 0.57–1.00)
GFR est AA: 128 mL/min/{1.73_m2} (ref >59)
GFR est non-AA: 111 mL/min/{1.73_m2} (ref >59)
GLOBULIN, TOTAL: 3.3 g/dL (ref 1.5–4.5)
Glucose: 335 mg/dL — ABNORMAL HIGH (ref 65–99)
Potassium: 4.8 mmol/L (ref 3.5–5.2)
Protein, total: 7.3 g/dL (ref 6.0–8.5)
Sodium: 135 mmol/L (ref 134–144)

## 2017-09-01 LAB — CHLAMYDIA/GC PCR
Chlamydia trachomatis, NAA: NEGATIVE
Neisseria gonorrhoeae, NAA: NEGATIVE

## 2017-09-01 LAB — MICROALBUMIN, UR, RAND W/ MICROALB/CREAT RATIO
Creatinine, urine random: 25.3 mg/dL
Microalb/Creat ratio (ug/mg creat.): <11.9 mg/g creat (ref 0.0–30.0)
Microalbumin, urine: <3 ug/mL

## 2017-09-01 LAB — HEMOGLOBIN A1C WITH EAG
Estimated average glucose: 303 mg/dL
Hemoglobin A1c: 12.2 % — ABNORMAL HIGH (ref 4.8–5.6)

## 2017-09-01 LAB — HIV 1/2 AG/AB, 4TH GENERATION,W RFLX CONFIRM: HIV SCREEN 4TH GENERATION WRFX: NONREACTIVE

## 2017-09-01 LAB — TSH RFX ON ABNORMAL TO FREE T4: TSH: 0.951 u[IU]/mL (ref 0.450–4.500)

## 2017-09-01 MED ORDER — FLUCONAZOLE 150 MG TAB
150 mg | ORAL_TABLET | ORAL | 0 refills | Status: DC
Start: 2017-09-01 — End: 2017-11-28

## 2017-09-01 MED ORDER — METFORMIN SR 500 MG 24 HR TABLET
500 mg | ORAL_TABLET | Freq: Every day | ORAL | 0 refills | Status: DC
Start: 2017-09-01 — End: 2017-11-27

## 2017-09-01 MED ORDER — ATORVASTATIN 40 MG TAB
40 mg | ORAL_TABLET | Freq: Every day | ORAL | 1 refills | Status: DC
Start: 2017-09-01 — End: 2018-03-26

## 2017-09-01 MED ORDER — INSULIN GLARGINE 100 UNIT/ML (3 ML) SUB-Q PEN
100 unit/mL (3 mL) | SUBCUTANEOUS | 3 refills | Status: DC
Start: 2017-09-01 — End: 2018-02-08

## 2017-09-01 MED ORDER — LISINOPRIL 10 MG TAB
10 mg | ORAL_TABLET | Freq: Every day | ORAL | 0 refills | Status: DC
Start: 2017-09-01 — End: 2017-11-27

## 2017-09-01 MED ORDER — BLOOD GLUCOSE METER KIT
PACK | 0 refills | Status: DC
Start: 2017-09-01 — End: 2019-10-09

## 2017-09-01 MED ORDER — INSULIN NEEDLES (DISPOSABLE) 31 X 5/16"
31 gauge x 5/16" | PACK | 11 refills | Status: DC
Start: 2017-09-01 — End: 2019-01-28

## 2017-09-01 MED ORDER — BLOOD SUGAR DIAGNOSTIC TEST STRIPS
ORAL_STRIP | 11 refills | Status: DC
Start: 2017-09-01 — End: 2018-03-26

## 2017-09-01 MED ORDER — LANCETS
11 refills | Status: DC
Start: 2017-09-01 — End: 2018-06-07
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
## 2017-09-01 NOTE — Patient Instructions (Addendum)
Blood pressure: Not controlled, labs okay to restart lisinopril  Diabetes: Not controlled  Start metformin Er.  Okay to take with an iron containing multivitamin.  The take 1 tablet daily with dinner, can increase to 2 tablets daily with dinner if tolerating after 1 to 2 weeks.  Start Lantus: Inject 10 units each evening.  Increase by 2 units every 2 days until fasting blood sugar levels are 120 or below and then call my office with this new dose so that I can update your prescription  Go for diabetic education at the diabetic education center  Restart Lipitor at 40 mg moderate intensity dose  Continue to follow a healthy diet, increase exercise to minimum of 150 minutes of moderate intensity per week  Follow-up in 4 to 6 weeks or sooner if needed  Call if having any adverse effects or concerns  Diabetic supplies sent to the pharmacy as well but the patient can check with the diabetic education center regarding the supplies before getting them  Patient continues to have a yeast infection, I recommend that she get a Monistat product over-the-counter and she can absolutely buy the store brand not the name brand  I will send fluconazole, she can use 1 tablet now and 1 in 5 days if symptoms persist, the only way to truly get this under control is going to be for [place] to control her blood sugars the symptoms continue to persist I would recommend circling back with gynecology

## 2017-09-01 NOTE — Progress Notes (Signed)
Chief Complaint   Patient presents with   ??? Diabetes     Follow up     1. Have you been to the ER, urgent care clinic since your last visit?  Hospitalized since your last visit? No    2. Have you seen or consulted any other health care providers outside of the [facility] since your last visit?  Include any pap smears or colon screening. No

## 2017-09-05 NOTE — Telephone Encounter (Signed)
Called and spoke with pt, and she has been scheduled to be seen tomorrow.

## 2017-09-05 NOTE — Telephone Encounter (Signed)
Pt called stating she's in [facility] ER now due to severe leg pain and was advised to call Dr. Aleene Davidson after telling ER physician her symptoms started after taking Atorvastatin.      Pt requests call back at work number, [phone]. Ldm

## 2017-09-05 NOTE — Telephone Encounter (Signed)
Usually statin myalgia is both legs, so if one leg that is worrisome. Hard to assess by phone-recommend office visit to evaluate. OK to hold statin medication and see if improvement but without being seen very hard to manage.

## 2017-09-06 ENCOUNTER — Encounter: Attending: Family Medicine | Primary: Family Medicine

## 2017-09-13 ENCOUNTER — Ambulatory Visit: Attending: Family Medicine | Primary: Family Medicine

## 2017-09-13 ENCOUNTER — Ambulatory Visit: Admit: 2017-09-13 | Payer: PRIVATE HEALTH INSURANCE | Attending: Family Medicine | Primary: Family Medicine

## 2017-09-13 ENCOUNTER — Encounter: Primary: Family Medicine

## 2017-09-13 DIAGNOSIS — M791 Myalgia, unspecified site: Secondary | ICD-10-CM

## 2017-09-13 NOTE — Patient Instructions (Signed)
Suspect electrolyte abnormality given starting metformin (loose stools ) and insulin (can affect K homeostasis) rather than statin but will check cmp, cpk and magnesium for completeness, supportive care and plenty of fluids for now  Keep titrating insulin as we discussed  Ok to take home gabapentin PRN

## 2017-09-13 NOTE — Progress Notes (Signed)
The only lab abnormality noted is hyperglycemia, I suspect the aches and pains are from hydration status and I would encourage the patient to drink plenty of fluids, and no electrolyte no abnormality and no signs of muscle breakdown products indicating statin myopathy is most likely not the cause of her symptoms.  Advocate for stretching and hydration and exercise

## 2017-09-13 NOTE — Progress Notes (Signed)
Family Medicine Acute Visit Progress Note  Patient: Alyssa [name]  1980-06-17, 37 y.o., female  Encounter Date: 09/13/2017    ASSESSMENT & PLAN    ICD-10-CM ICD-9-CM    1. Myalgia M79.10 332.9 CK      METABOLIC PANEL, COMPREHENSIVE   2. Muscle cramp R25.2 518.84 CK      METABOLIC PANEL, COMPREHENSIVE      MAGNESIUM       Orders Placed This Encounter   ??? CK   ??? METABOLIC PANEL, COMPREHENSIVE   ??? MAGNESIUM   ??? TRUE METRIX GLUCOSE METER misc     Sig: USE TO CHECK BLOOD SUGAR 1 TO 3 TIMES A DAY AS DIRECTED     Refill:  0       Patient Instructions   Suspect electrolyte abnormality given starting metformin (loose stools ) and insulin (can affect K homeostasis) rather than statin but will check cmp, cpk and magnesium for completeness, supportive care and plenty of fluids for now  Keep titrating insulin as we discussed  Ok to take home gabapentin PRN      CHIEF COMPLAINT  Chief Complaint   Patient presents with   ??? Leg Pain     Bilateral       SUBJECTIVE  Alyssa [name] is a 37 y.o. female presenting today for 10 days of bilateral leg pain, spoke to a doctor at her work, recommended reach out to [place], had JUST started atorvastatin, has still had pain even after d/c more than 1 wk. Had gabapentin at home, took 2x and it seems to be helping. Pain returns when not taking medication.  Reports that pain is "a beginning of a charlie horse" where it feels the muscle is pulling , has it generally on the sides and the backs of the calves bilaterally. Alternating sides of legs, not constant but intermittent. Noticing it more during the day, not waking from sleep at night.   Limping with walking  Calves both mildly tender feeling but no swelling or redness  Review of Systems  A 12 point review of systems was negative except as noted here or in the HPI.    OBJECTIVE  Visit Vitals  BP 142/84 (BP 1 Location: Left arm, BP Patient Position: Sitting)   Pulse 96   Temp 98.6 ??F (37 ??C) (Oral)   Resp 18   Ht 5\' 3"  (1.6 m)   Wt 248 lb (112.5 kg)    LMP 03/08/2016 Comment: has heavy periods per patient   SpO2 99%   BMI 43.93 kg/m??       Physical Exam   Constitutional: She is oriented to person, place, and time.   Morbidly obese, no acute distress and nontoxic, well-appearing   HENT:   Head: Normocephalic and atraumatic.   Mouth/Throat: Oropharynx is clear and moist.   Eyes: Conjunctivae and EOM are normal. Right eye exhibits no discharge. Left eye exhibits no discharge. No scleral icterus.   Neck: Neck supple.   Pulmonary/Chest: Effort normal. No stridor. No respiratory distress.   Abdominal: She exhibits no distension.   Obese   Musculoskeletal: She exhibits tenderness (bilateral calf with moderate touch, straight leg neg). She exhibits no edema.   Antalgic gait   Neurological: She is alert and oriented to person, place, and time. No cranial nerve deficit.   Skin:   Warm and dry without rash, acanthosis nigricans noted at base of neck   Psychiatric: She has a normal mood and affect. Her behavior is normal. Judgment and  thought content normal.   Nursing note and vitals reviewed.      No results found for any visits on 09/13/17.    HISTORICAL  PMH, PSH, FHX, SOCHX, ALLERGIES and MES were reviewed and updated today.    Doneen Poisson, MD  [facility]  09/13/17 3:26 PM    Portions of this note may have been populated using smart dictation software and may have "sounds-like" errors present.     Pt was counseled on risks, benefits and alternatives of treatment options. All questions were asked and answered and the patient was agreeable with the treatment plan as outlined.

## 2017-09-13 NOTE — Progress Notes (Signed)
Family Medicine Acute Visit Progress Note  Patient: Alyssa [name]  06-Nov-1980, 37 y.o., female  Encounter Date: 09/13/2017    ASSESSMENT & PLAN    ICD-10-CM ICD-9-CM    1. Myalgia M79.10 606.3 CK      METABOLIC PANEL, COMPREHENSIVE   2. Muscle cramp R25.2 016.01 CK      METABOLIC PANEL, COMPREHENSIVE      MAGNESIUM       Orders Placed This Encounter   ??? CK   ??? METABOLIC PANEL, COMPREHENSIVE   ??? MAGNESIUM   ??? TRUE METRIX GLUCOSE METER misc     Sig: USE TO CHECK BLOOD SUGAR 1 TO 3 TIMES A DAY AS DIRECTED     Refill:  0       Patient Instructions   Suspect electrolyte abnormality given starting metformin (loose stools ) and insulin (can affect K homeostasis) rather than statin but will check cmp, cpk and magnesium for completeness, supportive care and plenty of fluids for now  Keep titrating insulin as we discussed  Ok to take home gabapentin PRN      CHIEF COMPLAINT  Chief Complaint   Patient presents with   ??? Leg Pain     Bilateral       SUBJECTIVE  Alyssa [name] is a 37 y.o. female presenting today for 10 days of bilateral leg pain, spoke to a doctor at her work, recommended reach out to [place], had JUST started atorvastatin, has still had pain even after d/c more than 1 wk. Had gabapentin at home, took 2x and it seems to be helping. Pain returns when not taking medication.  Reports that pain is "a beginning of a charlie horse" where it feels the muscle is pulling , has it generally on the sides and the backs of the calves bilaterally. Alternating sides of legs, not constant but intermittent. Noticing it more during the day, not waking from sleep at night.   Limping with walking  Calves both mildly tender feeling but no swelling or redness  Review of Systems  A 12 point review of systems was negative except as noted here or in the HPI.    OBJECTIVE  Visit Vitals  BP 142/84 (BP 1 Location: Left arm, BP Patient Position: Sitting)   Pulse 96   Temp 98.6 ??F (37 ??C) (Oral)   Resp 18   Ht 5\' 3"  (1.6 m)   Wt 248 lb (112.5 kg)    LMP 03/08/2016 Comment: has heavy periods per patient   SpO2 99%   BMI 43.93 kg/m??       Physical Exam   Constitutional: She is oriented to person, place, and time.   Morbidly obese, no acute distress and nontoxic, well-appearing   HENT:   Head: Normocephalic and atraumatic.   Mouth/Throat: Oropharynx is clear and moist.   Eyes: Conjunctivae and EOM are normal. Right eye exhibits no discharge. Left eye exhibits no discharge. No scleral icterus.   Neck: Neck supple.   Pulmonary/Chest: Effort normal. No stridor. No respiratory distress.   Abdominal: She exhibits no distension.   Obese   Musculoskeletal: She exhibits tenderness (bilateral calf with moderate touch, straight leg neg). She exhibits no edema.   Antalgic gait   Neurological: She is alert and oriented to person, place, and time. No cranial nerve deficit.   Skin:   Warm and dry without rash, acanthosis nigricans noted at base of neck   Psychiatric: She has a normal mood and affect. Her behavior is normal. Judgment and  thought content normal.   Nursing note and vitals reviewed.      No results found for any visits on 09/13/17.    HISTORICAL  PMH, PSH, FHX, SOCHX, ALLERGIES and MES were reviewed and updated today.    Doneen Poisson, MD  [facility]  09/13/17 3:26 PM    Portions of this note may have been populated using smart dictation software and may have "sounds-like" errors present.     Pt was counseled on risks, benefits and alternatives of treatment options. All questions were asked and answered and the patient was agreeable with the treatment plan as outlined.

## 2017-09-14 LAB — COMPREHENSIVE METABOLIC PANEL
ALT: 11 IU/L (ref 0–32)
AST: 9 IU/L (ref 0–40)
Albumin/Globulin Ratio: 1.3 NA (ref 1.2–2.2)
Albumin: 4.2 g/dL (ref 3.5–5.5)
Alkaline Phosphatase: 65 IU/L (ref 39–117)
BUN/Creatinine Ratio: 13 NA (ref 9–23)
BUN: 11 mg/dL (ref 6–20)
CO2: 25 mmol/L (ref 20–29)
Calcium: 9.4 mg/dL (ref 8.7–10.2)
Chloride: 100 mmol/L (ref 96–106)
Creatinine: 0.85 mg/dL (ref 0.57–1.00)
GFR African American: 101 mL/min/{1.73_m2} (ref >59)
Globulin, Total: 3.2 g/dL (ref 1.5–4.5)
Glucose: 238 mg/dL — ABNORMAL HIGH (ref 65–99)
Potassium: 4.4 mmol/L (ref 3.5–5.2)
Sodium: 141 mmol/L (ref 134–144)
Total Bilirubin: 0.3 mg/dL (ref 0.0–1.2)
Total Protein: 7.4 g/dL (ref 6.0–8.5)
eGFR NON-AA: 87 mL/min/{1.73_m2} (ref >59)

## 2017-09-14 LAB — MAGNESIUM
Magnesium: 1.8 mg/dL (ref 1.6–2.3)
Magnesium: 1.8 mg/dL (ref 1.6–2.3)

## 2017-09-14 LAB — CK
Creatine Kinase,Total: 60 U/L (ref 24–173)
Total CK: 60 U/L (ref 24–173)

## 2017-09-14 LAB — METABOLIC PANEL, COMPREHENSIVE
A-G Ratio: 1.3 (ref 1.2–2.2)
ALT (SGPT): 11 IU/L (ref 0–32)
AST (SGOT): 9 IU/L (ref 0–40)
Albumin: 4.2 g/dL (ref 3.5–5.5)
Alk. phosphatase: 65 IU/L (ref 39–117)
BUN/Creatinine ratio: 13 (ref 9–23)
BUN: 11 mg/dL (ref 6–20)
Bilirubin, total: 0.3 mg/dL (ref 0.0–1.2)
CO2: 25 mmol/L (ref 20–29)
Calcium: 9.4 mg/dL (ref 8.7–10.2)
Chloride: 100 mmol/L (ref 96–106)
Creatinine: 0.85 mg/dL (ref 0.57–1.00)
GFR est AA: 101 mL/min/{1.73_m2} (ref >59)
GFR est non-AA: 87 mL/min/{1.73_m2} (ref >59)
GLOBULIN, TOTAL: 3.2 g/dL (ref 1.5–4.5)
Glucose: 238 mg/dL — ABNORMAL HIGH (ref 65–99)
Potassium: 4.4 mmol/L (ref 3.5–5.2)
Protein, total: 7.4 g/dL (ref 6.0–8.5)
Sodium: 141 mmol/L (ref 134–144)

## 2017-09-29 ENCOUNTER — Emergency Department: Admit: 2017-09-30 | Payer: PRIVATE HEALTH INSURANCE | Primary: Family Medicine

## 2017-09-29 DIAGNOSIS — J069 Acute upper respiratory infection, unspecified: Secondary | ICD-10-CM
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
## 2017-09-29 NOTE — ED Triage Notes (Signed)
Pt reports having diarrhea 3-4 days ago. yesterday she began to get a sore throat. Today she had a fever. Pt is coughing up green phlegm. She has body aches

## 2017-09-29 NOTE — ED Notes (Signed)
Medicated per order, pt off the floor to imaging.

## 2017-09-29 NOTE — ED Notes (Signed)
Patient discharged home after receiving discharge instructions from MD.  Patient voiced understanding and doesn't have any questions at this time. Patient in no distress at this time. Pt ambulated out of the ER.

## 2017-09-29 NOTE — ED Notes (Signed)
Pt resting on stretcher, reports feeling much better. Repeat temperature 99.2, blanket provided for comfort. Call bell in reach
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
## 2017-09-29 NOTE — ED Notes (Signed)
Patient discharged home after receiving discharge instructions from MD.  Patient voiced understanding and doesn't have any questions at this time. Patient in no distress at this time. Pt ambulated out of the ER

## 2017-09-29 NOTE — ED Notes (Signed)
Pt reports having diarrhea 3-4 days ago. yesterday she began to get a sore throat. Today she had a fever. Pt is coughing up green phlegm. She has body aches

## 2017-09-30 ENCOUNTER — Inpatient Hospital Stay
Admit: 2017-09-30 | Discharge: 2017-09-30 | Disposition: A | Discharge status: Home / Self Care | Payer: PRIVATE HEALTH INSURANCE | Attending: Emergency Medicine

## 2017-09-30 LAB — COMPREHENSIVE METABOLIC PANEL
ALT: 24 U/L (ref 12–78)
AST: 15 U/L (ref 15–37)
Albumin/Globulin Ratio: 0.7 — ABNORMAL LOW (ref 1.1–2.2)
Albumin: 3.6 g/dL (ref 3.5–5.0)
Alkaline Phosphatase: 84 U/L (ref 45–117)
Anion Gap: 11 mmol/L (ref 5–15)
BUN: 10 MG/DL (ref 6–20)
Bun/Cre Ratio: 12 (ref 12–20)
CO2: 25 mmol/L (ref 21–32)
Calcium: 9.1 MG/DL (ref 8.5–10.1)
Chloride: 96 mmol/L — ABNORMAL LOW (ref 97–108)
Creatinine: 0.85 MG/DL (ref 0.55–1.02)
EGFR IF NonAfrican American: >60 mL/min/{1.73_m2} (ref >60)
GFR African American: >60 mL/min/{1.73_m2} (ref >60)
Globulin: 5 g/dL — ABNORMAL HIGH (ref 2.0–4.0)
Glucose: 308 mg/dL — ABNORMAL HIGH (ref 65–100)
Potassium: 3.9 mmol/L (ref 3.5–5.1)
Sodium: 132 mmol/L — ABNORMAL LOW (ref 136–145)
Total Bilirubin: 0.4 MG/DL (ref 0.2–1.0)
Total Protein: 8.6 g/dL — ABNORMAL HIGH (ref 6.4–8.2)

## 2017-09-30 LAB — CBC WITH AUTO DIFFERENTIAL
Basophils %: 0 % (ref 0–1)
Basophils Absolute: 0 10*3/uL (ref 0.0–0.1)
Eosinophils %: 1 % (ref 0–7)
Eosinophils Absolute: 0.1 10*3/uL (ref 0.0–0.4)
Hematocrit: 40.1 % (ref 35.0–47.0)
Hemoglobin: 13 g/dL (ref 11.5–16.0)
Lymphocytes %: 14 % (ref 12–49)
Lymphocytes Absolute: 1.6 10*3/uL (ref 0.8–3.5)
MCH: 26 PG (ref 26.0–34.0)
MCHC: 32.4 g/dL (ref 30.0–36.5)
MCV: 80.2 FL (ref 80.0–99.0)
MPV: 11.8 FL (ref 8.9–12.9)
Monocytes %: 9 % (ref 5–13)
Monocytes Absolute: 1 10*3/uL (ref 0.0–1.0)
Neutrophils %: 76 % — ABNORMAL HIGH (ref 32–75)
Neutrophils Absolute: 8.8 10*3/uL — ABNORMAL HIGH (ref 1.8–8.0)
Platelets: 370 10*3/uL (ref 150–400)
RBC: 5 M/uL (ref 3.80–5.20)
RDW: 15.2 % — ABNORMAL HIGH (ref 11.5–14.5)
WBC: 11.5 10*3/uL — ABNORMAL HIGH (ref 3.6–11.0)

## 2017-09-30 LAB — URINALYSIS WITH MICROSCOPIC
BACTERIA, URINE: NEGATIVE /hpf
Bilirubin, Urine: NEGATIVE
Blood, Urine: NEGATIVE
Glucose, Ur: >1000 mg/dL — AB
Ketones, Urine: NEGATIVE mg/dL
Leukocyte Esterase, Urine: NEGATIVE
Nitrite, Urine: NEGATIVE
Protein, UA: NEGATIVE mg/dL
Specific Gravity, UA: 1.01 (ref 1.003–1.030)
Urobilinogen, UA, POCT: 0.2 EU/dL (ref 0.2–1.0)
pH, UA: 7.5 (ref 5.0–8.0)

## 2017-09-30 LAB — POCT LACTIC ACID: POC Lactic Acid: 1.73 mmol/L (ref 0.40–2.00)

## 2017-09-30 LAB — URINALYSIS W/MICROSCOPIC
Bacteria: NEGATIVE /hpf
Bilirubin: NEGATIVE
Blood: NEGATIVE
Glucose: >1000 mg/dL — AB
Ketone: NEGATIVE mg/dL
Leukocyte Esterase: NEGATIVE
Nitrites: NEGATIVE
Protein: NEGATIVE mg/dL
Specific gravity: 1.01 (ref 1.003–1.030)
Urobilinogen: 0.2 EU/dL (ref 0.2–1.0)
pH (UA): 7.5 (ref 5.0–8.0)

## 2017-09-30 LAB — CBC WITH AUTOMATED DIFF
ABS. BASOPHILS: 0 10*3/uL (ref 0.0–0.1)
ABS. EOSINOPHILS: 0.1 10*3/uL (ref 0.0–0.4)
ABS. LYMPHOCYTES: 1.6 10*3/uL (ref 0.8–3.5)
ABS. MONOCYTES: 1 10*3/uL (ref 0.0–1.0)
ABS. NEUTROPHILS: 8.8 10*3/uL — ABNORMAL HIGH (ref 1.8–8.0)
BASOPHILS: 0 % (ref 0–1)
EOSINOPHILS: 1 % (ref 0–7)
HCT: 40.1 % (ref 35.0–47.0)
HGB: 13 g/dL (ref 11.5–16.0)
LYMPHOCYTES: 14 % (ref 12–49)
MCH: 26 PG (ref 26.0–34.0)
MCHC: 32.4 g/dL (ref 30.0–36.5)
MCV: 80.2 FL (ref 80.0–99.0)
MONOCYTES: 9 % (ref 5–13)
MPV: 11.8 FL (ref 8.9–12.9)
NEUTROPHILS: 76 % — ABNORMAL HIGH (ref 32–75)
PLATELET: 370 10*3/uL (ref 150–400)
RBC: 5 M/uL (ref 3.80–5.20)
RDW: 15.2 % — ABNORMAL HIGH (ref 11.5–14.5)
WBC: 11.5 10*3/uL — ABNORMAL HIGH (ref 3.6–11.0)

## 2017-09-30 LAB — URINE CULTURE HOLD SAMPLE

## 2017-09-30 LAB — METABOLIC PANEL, COMPREHENSIVE
A-G Ratio: 0.7 — ABNORMAL LOW (ref 1.1–2.2)
ALT (SGPT): 24 U/L (ref 12–78)
AST (SGOT): 15 U/L (ref 15–37)
Albumin: 3.6 g/dL (ref 3.5–5.0)
Alk. phosphatase: 84 U/L (ref 45–117)
Anion gap: 11 mmol/L (ref 5–15)
BUN/Creatinine ratio: 12 (ref 12–20)
BUN: 10 MG/DL (ref 6–20)
Bilirubin, total: 0.4 MG/DL (ref 0.2–1.0)
CO2: 25 mmol/L (ref 21–32)
Calcium: 9.1 MG/DL (ref 8.5–10.1)
Chloride: 96 mmol/L — ABNORMAL LOW (ref 97–108)
Creatinine: 0.85 MG/DL (ref 0.55–1.02)
GFR est AA: >60 mL/min/{1.73_m2} (ref >60)
GFR est non-AA: >60 mL/min/{1.73_m2} (ref >60)
Globulin: 5 g/dL — ABNORMAL HIGH (ref 2.0–4.0)
Glucose: 308 mg/dL — ABNORMAL HIGH (ref 65–100)
Potassium: 3.9 mmol/L (ref 3.5–5.1)
Protein, total: 8.6 g/dL — ABNORMAL HIGH (ref 6.4–8.2)
Sodium: 132 mmol/L — ABNORMAL LOW (ref 136–145)

## 2017-09-30 LAB — SAMPLES BEING HELD: SAMPLES BEING HELD: 1

## 2017-09-30 LAB — POC LACTIC ACID: Lactic Acid (POC): 1.73 mmol/L (ref 0.40–2.00)

## 2017-09-30 MED ORDER — SODIUM CHLORIDE 0.9% BOLUS IV
0.9 % | Freq: Once | INTRAVENOUS | Status: AC
Start: 2017-09-30 — End: 2017-09-29
  Administered 2017-09-30: 02:00:00 via INTRAVENOUS

## 2017-09-30 MED ORDER — KETOROLAC TROMETHAMINE 30 MG/ML INJECTION
30 mg/mL (1 mL) | INTRAMUSCULAR | Status: AC
Start: 2017-09-30 — End: 2017-09-29
  Administered 2017-09-30: 02:00:00 via INTRAVENOUS

## 2017-09-30 MED ORDER — AZITHROMYCIN 250 MG TAB
250 mg | ORAL_TABLET | Freq: Every day | ORAL | 0 refills | Status: AC
Start: 2017-09-30 — End: 2017-10-03

## 2017-09-30 MED ORDER — AZITHROMYCIN 250 MG TAB
250 mg | ORAL | Status: AC
Start: 2017-09-30 — End: 2017-09-29
  Administered 2017-09-30: 03:00:00 via ORAL

## 2017-09-30 MED ORDER — ACETAMINOPHEN 500 MG TAB
500 mg | ORAL | Status: AC
Start: 2017-09-30 — End: 2017-09-29
  Administered 2017-09-30: 02:00:00 via ORAL

## 2017-09-30 MED FILL — KETOROLAC TROMETHAMINE 30 MG/ML INJECTION: 30 mg/mL (1 mL) | INTRAMUSCULAR | Qty: 1

## 2017-09-30 MED FILL — SODIUM CHLORIDE 0.9 % IV: INTRAVENOUS | Qty: 1000

## 2017-09-30 MED FILL — ACETAMINOPHEN 500 MG TAB: 500 mg | ORAL | Qty: 2

## 2017-09-30 MED FILL — AZITHROMYCIN 250 MG TAB: 250 mg | ORAL | Qty: 2

## 2017-10-13 ENCOUNTER — Encounter: Attending: Family Medicine | Primary: Family Medicine

## 2017-11-02 MED ORDER — VALACYCLOVIR 1 G TAB
1 gram | ORAL_TABLET | ORAL | 0 refills | Status: DC
Start: 2017-11-02 — End: 2017-12-22

## 2017-11-02 NOTE — Telephone Encounter (Signed)
Last visit with MH that indicates positive hx of herpes, can use valtrex 1000 mg bid x 7d      RX sent

## 2017-11-08 ENCOUNTER — Encounter: Attending: Family Medicine | Primary: Family Medicine

## 2017-11-14 ENCOUNTER — Encounter: Attending: Family Medicine | Primary: Family Medicine

## 2017-11-15 ENCOUNTER — Ambulatory Visit
Admit: 2017-11-15 | Discharge: 2017-11-15 | Payer: PRIVATE HEALTH INSURANCE | Attending: Family Medicine | Primary: Family Medicine

## 2017-11-15 DIAGNOSIS — K219 Gastro-esophageal reflux disease without esophagitis: Secondary | ICD-10-CM

## 2017-11-15 MED ORDER — FAMOTIDINE 40 MG TAB
40 mg | ORAL_TABLET | Freq: Every day | ORAL | 1 refills | Status: DC
Start: 2017-11-15 — End: 2017-11-28

## 2017-11-15 MED ORDER — OMEPRAZOLE 40 MG CAP, DELAYED RELEASE
40 mg | ORAL_CAPSULE | Freq: Two times a day (BID) | ORAL | 0 refills | Status: DC
Start: 2017-11-15 — End: 2017-11-27

## 2017-11-15 NOTE — Progress Notes (Signed)
Family Medicine Acute Visit Progress Note  Patient: Alyssa [name]  June 29, 1980, 37 y.o., female  Encounter Date: 11/15/2017    ASSESSMENT & PLAN    ICD-10-CM ICD-9-CM    1. Gastroesophageal reflux disease, esophagitis presence not specified K21.9 530.81 REFERRAL TO GASTROENTEROLOGY   2. Type 2 diabetes mellitus with diabetic neuropathy, with long-term current use of insulin (HCC) E11.40 250.60 HEMOGLOBIN A1C WITH EAG    Z79.4 357.2      V58.67    3. Obesity, morbid ([place]) E66.01 278.01        Orders Placed This Encounter   ??? HEMOGLOBIN A1C WITH EAG     Standing Status:   Future     Standing Expiration Date:   05/18/2018   ??? REFERRAL TO GASTROENTEROLOGY     Referral Priority:   Routine     Referral Type:   Consultation     Referral Reason:   Specialty Services Required     Referred to Provider:   Suszanne Conners, MD     Requested Specialty:   Gastroenterology     Number of Visits Requested:   1   ??? DISCONTD: omeprazole (PRILOSEC OTC) 20 mg tablet     Sig: Take 20 mg by mouth daily.   ??? omeprazole (PRILOSEC) 40 mg capsule     Sig: Take 1 Cap by mouth Before breakfast and dinner.     Dispense:  30 Cap     Refill:  0   ??? famotidine (PEPCID) 40 mg tablet     Sig: Take 1 Tab by mouth daily.     Dispense:  90 Tab     Refill:  1       Patient Instructions   Believe the patient's symptoms are related to an exacerbation of her gastroesophageal reflux disease and I would recommend at this point that we increase her PPI dosing to twice daily before meals and we schedule her with gastroenterology, I think she would benefit from an EGD to ensure that there are no changes to the esophageal lining, also to do a biopsy possibly for H. pylori, she has been on a PPI and so of course the stool antigen would not be reliable or specific right now  A1c was elevated at last check, due again in 15 days and A1c has been ordered for her today to get done on or after August 16   She has changed her diet and is working towards lowering the A1c number in addition to medication compliance  I discussed with her that weight loss is paramount to control of both her diabetes and her acid reflux symptoms, avoiding tight constricting close is important but also exercising regularly eating a healthy low-fat diet is important as well  We will discuss her lab results when they were concerned and I would like for her to ask her gastroenterology doctor to send me a copy of his note he has seen her  Call with questions or concerns      [place]  Chief Complaint   Patient presents with   ??? Chest Pain     [facility] - per patient comes and goes    ??? GERD     pt has started staking OTC Prilosec        SUBJECTIVE  Alyssa [name] is a 37 y.o. female presenting today for follow up from an ER visit at [facility]  On July 17th she laid down and went  to sleep and she felt that chest pain woke her up out of her sleep within about 20 minutes. She was worried about the pain but she tried to drive herself to the ER, her aunt ended up taking her.  She reports she had this pain intermittently since 2010, she reports it starts "at the top of her clevage and up into the bottom of her neck, she feels a pain/pressure feeling in her mid abdomen.  She used to take pepcid for this (last rx was 2015) and she has stopped it  Taking OTC prilosec  Generally worse when she lays down and she report that when she drinks something ice cold the feeling goes away and iyt gets better, also better with sitting up.  Frustrated with inability to lose weight--wondering if something is wrong with her thyroid  Am: wheat bagel with peanut butter  Lunch: salad  Dinner: mostly protein (she had cabbage, potatoes and chicken last night)  Switched to iced tea that isn't sweet from sugar, limiting carbs  Has gained 6 pounds since May  Review of Systems   A 12 point review of systems was negative except as noted here or in the HPI.    OBJECTIVE  Visit Vitals  BP 143/88   Pulse 99   Temp 98.3 ??F (36.8 ??C) (Oral)   Resp 18   Ht 5\' 3"  (1.6 m)   Wt 254 lb (115.2 kg)   LMP 03/08/2016 Comment: has heavy periods per patient   BMI 44.99 kg/m??       Physical Exam   Constitutional: She is oriented to person, place, and time.   Morbidly obese, no acute distress and nontoxic, well-appearing   HENT:   Head: Normocephalic and atraumatic.   Mouth/Throat: Oropharynx is clear and moist.   Eyes: Conjunctivae and EOM are normal. Right eye exhibits no discharge. Left eye exhibits no discharge. No scleral icterus.   Neck: Neck supple.   Pulmonary/Chest: Effort normal. No stridor. No respiratory distress.   Abdominal: She exhibits no distension. There is tenderness (Mid epigastrium). There is no rebound and no guarding.   Obese   Musculoskeletal: She exhibits no edema.   Ambulates independently   Neurological: She is alert and oriented to person, place, and time. No cranial nerve deficit.   Skin:   Warm and dry without rash, acanthosis nigricans noted at base of neck   Psychiatric: She has a normal mood and affect. Her behavior is normal. Judgment and thought content normal.   Nursing note and vitals reviewed.      No results found for any visits on 11/15/17.    HISTORICAL  PMH, PSH, FHX, SOCHX, ALLERGIES and MES were reviewed and updated today.    Doneen Poisson, MD  [facility]  11/15/17 11:53 AM    Portions of this note may have been populated using smart dictation software and may have "sounds-like" errors present.     Pt was counseled on risks, benefits and alternatives of treatment options. All questions were asked and answered and the patient was agreeable with the treatment plan as outlined.

## 2017-11-15 NOTE — Patient Instructions (Signed)
Believe the patient's symptoms are related to an exacerbation of her gastroesophageal reflux disease and I would recommend at this point that we increase her PPI dosing to twice daily before meals and we schedule her with gastroenterology, I think she would benefit from an EGD to ensure that there are no changes to the esophageal lining, also to do a biopsy possibly for H. pylori, she has been on a PPI and so of course the stool antigen would not be reliable or specific right now  A1c was elevated at last check, due again in 15 days and A1c has been ordered for her today to get done on or after August 16  She has changed her diet and is working towards lowering the A1c number in addition to medication compliance  I discussed with her that weight loss is paramount to control of both her diabetes and her acid reflux symptoms, avoiding tight constricting close is important but also exercising regularly eating a healthy low-fat diet is important as well  We will discuss her lab results when they were concerned and I would like for her to ask her gastroenterology doctor to send me a copy of his note he has seen her  Call with questions or concerns

## 2017-11-15 NOTE — Progress Notes (Signed)
Chief Complaint   Patient presents with   ??? Chest Pain     [facility] - per patient comes and goes    ??? GERD     pt has started staking OTC Prilosec

## 2017-11-15 NOTE — Progress Notes (Signed)
Chief Complaint   Patient presents with   . Chest Pain     [facility] - per patient comes and goes    . GERD     pt has started staking OTC Prilosec

## 2017-11-15 NOTE — Progress Notes (Signed)
Family Medicine Acute Visit Progress Note  Patient: Alyssa [name]  October 08, 1980, 37 y.o., female  Encounter Date: 11/15/2017    ASSESSMENT & PLAN    ICD-10-CM ICD-9-CM    1. Gastroesophageal reflux disease, esophagitis presence not specified K21.9 530.81 REFERRAL TO GASTROENTEROLOGY   2. Type 2 diabetes mellitus with diabetic neuropathy, with long-term current use of insulin (HCC) E11.40 250.60 HEMOGLOBIN A1C WITH EAG    Z79.4 357.2      V58.67    3. Obesity, morbid ([place]) E66.01 278.01        Orders Placed This Encounter   ??? HEMOGLOBIN A1C WITH EAG     Standing Status:   Future     Standing Expiration Date:   05/18/2018   ??? REFERRAL TO GASTROENTEROLOGY     Referral Priority:   Routine     Referral Type:   Consultation     Referral Reason:   Specialty Services Required     Referred to Provider:   Suszanne Conners, MD     Requested Specialty:   Gastroenterology     Number of Visits Requested:   1   ??? DISCONTD: omeprazole (PRILOSEC OTC) 20 mg tablet     Sig: Take 20 mg by mouth daily.   ??? omeprazole (PRILOSEC) 40 mg capsule     Sig: Take 1 Cap by mouth Before breakfast and dinner.     Dispense:  30 Cap     Refill:  0   ??? famotidine (PEPCID) 40 mg tablet     Sig: Take 1 Tab by mouth daily.     Dispense:  90 Tab     Refill:  1       Patient Instructions   Believe the patient's symptoms are related to an exacerbation of her gastroesophageal reflux disease and I would recommend at this point that we increase her PPI dosing to twice daily before meals and we schedule her with gastroenterology, I think she would benefit from an EGD to ensure that there are no changes to the esophageal lining, also to do a biopsy possibly for H. pylori, she has been on a PPI and so of course the stool antigen would not be reliable or specific right now  A1c was elevated at last check, due again in 15 days and A1c has been ordered for her today to get done on or after August 16  She has changed her diet and is working towards lowering the A1c  number in addition to medication compliance  I discussed with her that weight loss is paramount to control of both her diabetes and her acid reflux symptoms, avoiding tight constricting close is important but also exercising regularly eating a healthy low-fat diet is important as well  We will discuss her lab results when they were concerned and I would like for her to ask her gastroenterology doctor to send me a copy of his note he has seen her  Call with questions or concerns      Crystal  Chief Complaint   Patient presents with   ??? Chest Pain     [facility] - per patient comes and goes    ??? GERD     pt has started staking OTC Prilosec        SUBJECTIVE  Alyssa [name] is a 37 y.o. female presenting today for follow up from an ER visit at [facility]  On July 17th she laid down and went  to sleep and she felt that chest pain woke her up out of her sleep within about 20 minutes. She was worried about the pain but she tried to drive herself to the ER, her aunt ended up taking her.  She reports she had this pain intermittently since 2010, she reports it starts "at the top of her clevage and up into the bottom of her neck, she feels a pain/pressure feeling in her mid abdomen.  She used to take pepcid for this (last rx was 2015) and she has stopped it  Taking OTC prilosec  Generally worse when she lays down and she report that when she drinks something ice cold the feeling goes away and iyt gets better, also better with sitting up.  Frustrated with inability to lose weight--wondering if something is wrong with her thyroid  Am: wheat bagel with peanut butter  Lunch: salad  Dinner: mostly protein (she had cabbage, potatoes and chicken last night)  Switched to iced tea that isn't sweet from sugar, limiting carbs  Has gained 6 pounds since May  Review of Systems  A 12 point review of systems was negative except as noted here or in the HPI.    OBJECTIVE  Visit Vitals  BP 143/88   Pulse  99   Temp 98.3 ??F (36.8 ??C) (Oral)   Resp 18   Ht 5\' 3"  (1.6 m)   Wt 254 lb (115.2 kg)   LMP 03/08/2016 Comment: has heavy periods per patient   BMI 44.99 kg/m??       Physical Exam   Constitutional: She is oriented to person, place, and time.   Morbidly obese, no acute distress and nontoxic, well-appearing   HENT:   Head: Normocephalic and atraumatic.   Mouth/Throat: Oropharynx is clear and moist.   Eyes: Conjunctivae and EOM are normal. Right eye exhibits no discharge. Left eye exhibits no discharge. No scleral icterus.   Neck: Neck supple.   Pulmonary/Chest: Effort normal. No stridor. No respiratory distress.   Abdominal: She exhibits no distension. There is tenderness (Mid epigastrium). There is no rebound and no guarding.   Obese   Musculoskeletal: She exhibits no edema.   Ambulates independently   Neurological: She is alert and oriented to person, place, and time. No cranial nerve deficit.   Skin:   Warm and dry without rash, acanthosis nigricans noted at base of neck   Psychiatric: She has a normal mood and affect. Her behavior is normal. Judgment and thought content normal.   Nursing note and vitals reviewed.      No results found for any visits on 11/15/17.    HISTORICAL  PMH, PSH, FHX, SOCHX, ALLERGIES and MES were reviewed and updated today.    Doneen Poisson, MD  [facility]  11/15/17 11:53 AM    Portions of this note may have been populated using smart dictation software and may have "sounds-like" errors present.     Pt was counseled on risks, benefits and alternatives of treatment options. All questions were asked and answered and the patient was agreeable with the treatment plan as outlined.

## 2017-11-27 MED ORDER — LISINOPRIL 10 MG TAB
10 mg | ORAL_TABLET | ORAL | 2 refills | Status: DC
Start: 2017-11-27 — End: 2018-06-08

## 2017-11-27 MED ORDER — METFORMIN SR 500 MG 24 HR TABLET
500 mg | ORAL_TABLET | ORAL | 2 refills | Status: DC
Start: 2017-11-27 — End: 2018-06-08

## 2017-11-27 MED ORDER — OMEPRAZOLE 40 MG CAP, DELAYED RELEASE
40 mg | ORAL_CAPSULE | ORAL | 0 refills | Status: DC
Start: 2017-11-27 — End: 2017-12-22

## 2017-11-28 ENCOUNTER — Emergency Department: Admit: 2017-11-29 | Payer: PRIVATE HEALTH INSURANCE | Primary: Family Medicine

## 2017-11-28 ENCOUNTER — Inpatient Hospital Stay
Admit: 2017-11-28 | Discharge: 2017-11-29 | Disposition: A | Discharge status: Home / Self Care | Payer: PRIVATE HEALTH INSURANCE | Attending: Emergency Medicine

## 2017-11-28 DIAGNOSIS — K219 Gastro-esophageal reflux disease without esophagitis: Secondary | ICD-10-CM

## 2017-11-28 LAB — CBC WITH AUTO DIFFERENTIAL
Basophils %: 1 % (ref 0–1)
Basophils Absolute: 0 10*3/uL (ref 0.0–0.1)
Eosinophils %: 1 % (ref 0–7)
Eosinophils Absolute: 0.1 10*3/uL (ref 0.0–0.4)
Hematocrit: 36.8 % (ref 35.0–47.0)
Hemoglobin: 12.1 g/dL (ref 11.5–16.0)
Lymphocytes %: 38 % (ref 12–49)
Lymphocytes Absolute: 3.2 10*3/uL (ref 0.8–3.5)
MCH: 26.9 PG (ref 26.0–34.0)
MCHC: 32.9 g/dL (ref 30.0–36.5)
MCV: 82 FL (ref 80.0–99.0)
MPV: 12 FL (ref 8.9–12.9)
Monocytes %: 8 % (ref 5–13)
Monocytes Absolute: 0.7 10*3/uL (ref 0.0–1.0)
Neutrophils %: 52 % (ref 32–75)
Neutrophils Absolute: 4.4 10*3/uL (ref 1.8–8.0)
Platelets: 341 10*3/uL (ref 150–400)
RBC: 4.49 M/uL (ref 3.80–5.20)
RDW: 14.6 % — ABNORMAL HIGH (ref 11.5–14.5)
WBC: 8.4 10*3/uL (ref 3.6–11.0)

## 2017-11-28 LAB — CBC WITH AUTOMATED DIFF
ABS. BASOPHILS: 0 10*3/uL (ref 0.0–0.1)
ABS. EOSINOPHILS: 0.1 10*3/uL (ref 0.0–0.4)
ABS. LYMPHOCYTES: 3.2 10*3/uL (ref 0.8–3.5)
ABS. MONOCYTES: 0.7 10*3/uL (ref 0.0–1.0)
ABS. NEUTROPHILS: 4.4 10*3/uL (ref 1.8–8.0)
BASOPHILS: 1 % (ref 0–1)
EOSINOPHILS: 1 % (ref 0–7)
HCT: 36.8 % (ref 35.0–47.0)
HGB: 12.1 g/dL (ref 11.5–16.0)
LYMPHOCYTES: 38 % (ref 12–49)
MCH: 26.9 PG (ref 26.0–34.0)
MCHC: 32.9 g/dL (ref 30.0–36.5)
MCV: 82 FL (ref 80.0–99.0)
MONOCYTES: 8 % (ref 5–13)
MPV: 12 FL (ref 8.9–12.9)
NEUTROPHILS: 52 % (ref 32–75)
PLATELET: 341 10*3/uL (ref 150–400)
RBC: 4.49 M/uL (ref 3.80–5.20)
RDW: 14.6 % — ABNORMAL HIGH (ref 11.5–14.5)
WBC: 8.4 10*3/uL (ref 3.6–11.0)

## 2017-11-28 LAB — SAMPLES BEING HELD: SAMPLES BEING HELD: 1

## 2017-11-28 MED ORDER — ONDANSETRON (PF) 4 MG/2 ML INJECTION
4 mg/2 mL | Freq: Once | INTRAMUSCULAR | Status: AC
Start: 2017-11-28 — End: 2017-11-28
  Administered 2017-11-29: via INTRAVENOUS

## 2017-11-28 MED ORDER — MORPHINE 4 MG/ML INTRAVENOUS SOLUTION
4 mg/mL | Freq: Once | INTRAVENOUS | Status: AC
Start: 2017-11-28 — End: 2017-11-28
  Administered 2017-11-29: via INTRAVENOUS

## 2017-11-28 MED ORDER — SODIUM CHLORIDE 0.9% BOLUS IV
0.9 % | Freq: Once | INTRAVENOUS | Status: AC
Start: 2017-11-28 — End: 2017-11-28
  Administered 2017-11-29: via INTRAVENOUS

## 2017-11-28 MED FILL — ONDANSETRON (PF) 4 MG/2 ML INJECTION: 4 mg/2 mL | INTRAMUSCULAR | Qty: 2

## 2017-11-28 MED FILL — SODIUM CHLORIDE 0.9 % IV: INTRAVENOUS | Qty: 1000
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
## 2017-11-28 NOTE — ED Notes (Signed)
Bedside shift change report given to TS, RN  (oncoming nurse) by PB, RN  (offgoing nurse). Report included the following information SBAR.

## 2017-11-28 NOTE — ED Notes (Addendum)
Patient became immediately calm after receiving IV medications. Has been on the phone and is now having a conversation in the room with a friend. Does not appear to be in distress

## 2017-11-28 NOTE — ED Triage Notes (Signed)
Pt presents with complaint of mid abd pain since 1PM with nausea. Pt states pain comes and goes in waves. Call bell within reach.

## 2017-11-28 NOTE — ED Notes (Signed)
Patient has been given a soda and jello per Dr Terrilyn Saver. After approximately 10 minutes she started to cry and say that "the pain has come back." Reported back to the physician and she is reevaluating

## 2017-11-28 NOTE — ED Notes (Signed)
Discharge note: The patient was discharged home in stable condition, accompanied by family member. The patient is alert and oriented, is in no respiratory distress. The patient's diagnosis, condition and treatment were explained to patient by Dr Terrilyn Saver and reinforced by nurse. The patient and family party expressed understanding of discharge instructions and plan of care.  A discharge plan has been developed. A case manager was not involved in the process. Patient offered a wheelchair to ED lobby for discharge but declined at this time. Patient ambulated with a steady gate to ED lobby to go home with family member.

## 2017-11-28 NOTE — ED Notes (Signed)
"  I don't really have any pain, I just can tell that there was a wave in my belly." Currently talking on her phone with her mom.

## 2017-11-28 NOTE — ED Notes (Signed)
Patient on the phone with her mom at this time, also watching TV

## 2017-11-28 NOTE — ED Notes (Signed)
Mother is at bedside and Dr. Terrilyn Saver is at bedside to discuss pt's care.
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
## 2017-11-28 NOTE — ED Notes (Signed)
Mother is at bedside and Dr. Babette Relic is at bedside to discuss pt's care.

## 2017-11-28 NOTE — ED Notes (Signed)
Pt presents with complaint of mid abd pain since 1PM with nausea. Pt states pain comes and goes in waves. Call bell within reach.

## 2017-11-28 NOTE — ED Notes (Signed)
Patient became immediately calm after receiving IV medications. Has been on the phone and is now having a conversation in the room with a friend. Does not appear to be in distress

## 2017-11-28 NOTE — ED Notes (Signed)
 Patient has been given a soda and jello per Dr Wadell. After approximately 10 minutes she started to cry and say that the pain has come back. Reported back to the physician and she is reevaluating

## 2017-11-28 NOTE — ED Notes (Signed)
 I don't really have any pain, I just can tell that there was a wave in my belly. Currently talking on her phone with her mom.

## 2017-11-29 LAB — COMPREHENSIVE METABOLIC PANEL
ALT: 19 U/L (ref 12–78)
AST: 10 U/L — ABNORMAL LOW (ref 15–37)
Albumin/Globulin Ratio: 0.8 — ABNORMAL LOW (ref 1.1–2.2)
Albumin: 3.4 g/dL — ABNORMAL LOW (ref 3.5–5.0)
Alkaline Phosphatase: 72 U/L (ref 45–117)
Anion Gap: 11 mmol/L (ref 5–15)
BUN: 15 MG/DL (ref 6–20)
Bun/Cre Ratio: 16 (ref 12–20)
CO2: 23 mmol/L (ref 21–32)
Calcium: 9.1 MG/DL (ref 8.5–10.1)
Chloride: 100 mmol/L (ref 97–108)
Creatinine: 0.93 MG/DL (ref 0.55–1.02)
EGFR IF NonAfrican American: >60 mL/min/{1.73_m2} (ref >60)
GFR African American: >60 mL/min/{1.73_m2} (ref >60)
Globulin: 4.4 g/dL — ABNORMAL HIGH (ref 2.0–4.0)
Glucose: 314 mg/dL — ABNORMAL HIGH (ref 65–100)
Potassium: 4.1 mmol/L (ref 3.5–5.1)
Sodium: 134 mmol/L — ABNORMAL LOW (ref 136–145)
Total Bilirubin: 0.3 MG/DL (ref 0.2–1.0)
Total Protein: 7.8 g/dL (ref 6.4–8.2)

## 2017-11-29 LAB — URINALYSIS WITH MICROSCOPIC
BACTERIA, URINE: NEGATIVE /hpf
Bilirubin, Urine: NEGATIVE
Blood, Urine: NEGATIVE
Glucose, Ur: >1000 mg/dL — AB
Ketones, Urine: NEGATIVE mg/dL
Leukocyte Esterase, Urine: NEGATIVE
Nitrite, Urine: NEGATIVE
Protein, UA: NEGATIVE mg/dL
Specific Gravity, UA: 1.02 (ref 1.003–1.030)
Urobilinogen, UA, POCT: 0.2 EU/dL (ref 0.2–1.0)
pH, UA: 6 (ref 5.0–8.0)

## 2017-11-29 LAB — LIPASE
Lipase: 206 U/L (ref 73–393)
Lipase: 206 U/L (ref 73–393)

## 2017-11-29 LAB — METABOLIC PANEL, COMPREHENSIVE
A-G Ratio: 0.8 — ABNORMAL LOW (ref 1.1–2.2)
ALT (SGPT): 19 U/L (ref 12–78)
AST (SGOT): 10 U/L — ABNORMAL LOW (ref 15–37)
Albumin: 3.4 g/dL — ABNORMAL LOW (ref 3.5–5.0)
Alk. phosphatase: 72 U/L (ref 45–117)
Anion gap: 11 mmol/L (ref 5–15)
BUN/Creatinine ratio: 16 (ref 12–20)
BUN: 15 MG/DL (ref 6–20)
Bilirubin, total: 0.3 MG/DL (ref 0.2–1.0)
CO2: 23 mmol/L (ref 21–32)
Calcium: 9.1 MG/DL (ref 8.5–10.1)
Chloride: 100 mmol/L (ref 97–108)
Creatinine: 0.93 MG/DL (ref 0.55–1.02)
GFR est AA: >60 mL/min/{1.73_m2} (ref >60)
GFR est non-AA: >60 mL/min/{1.73_m2} (ref >60)
Globulin: 4.4 g/dL — ABNORMAL HIGH (ref 2.0–4.0)
Glucose: 314 mg/dL — ABNORMAL HIGH (ref 65–100)
Potassium: 4.1 mmol/L (ref 3.5–5.1)
Protein, total: 7.8 g/dL (ref 6.4–8.2)
Sodium: 134 mmol/L — ABNORMAL LOW (ref 136–145)

## 2017-11-29 LAB — URINALYSIS W/MICROSCOPIC
Bacteria: NEGATIVE /hpf
Bilirubin: NEGATIVE
Blood: NEGATIVE
Glucose: >1000 mg/dL — AB
Ketone: NEGATIVE mg/dL
Leukocyte Esterase: NEGATIVE
Nitrites: NEGATIVE
Protein: NEGATIVE mg/dL
Specific gravity: 1.02 (ref 1.003–1.030)
Urobilinogen: 0.2 EU/dL (ref 0.2–1.0)
pH (UA): 6 (ref 5.0–8.0)

## 2017-11-29 LAB — URINE CULTURE HOLD SAMPLE

## 2017-11-29 MED ORDER — DICYCLOMINE 10 MG/ML IM SOLN
10 mg/mL | INTRAMUSCULAR | Status: AC
Start: 2017-11-29 — End: 2017-11-28
  Administered 2017-11-29: 02:00:00 via INTRAMUSCULAR

## 2017-11-29 MED ORDER — FAMOTIDINE 20 MG TAB
20 mg | ORAL | Status: AC
Start: 2017-11-29 — End: 2017-11-28
  Administered 2017-11-29: 03:00:00 via ORAL

## 2017-11-29 MED ORDER — ONDANSETRON (PF) 4 MG/2 ML INJECTION
4 mg/2 mL | Freq: Once | INTRAMUSCULAR | Status: AC
Start: 2017-11-29 — End: 2017-11-28
  Administered 2017-11-29: 02:00:00 via INTRAVENOUS

## 2017-11-29 MED ORDER — IOPAMIDOL 76 % IV SOLN
370 mg iodine /mL (76 %) | Freq: Once | INTRAVENOUS | Status: AC
Start: 2017-11-29 — End: 2017-11-28
  Administered 2017-11-29: 01:00:00 via INTRAVENOUS

## 2017-11-29 MED ORDER — ONDANSETRON (PF) 4 MG/2 ML INJECTION
4 mg/2 mL | Freq: Once | INTRAMUSCULAR | Status: DC
Start: 2017-11-29 — End: 2017-11-28

## 2017-11-29 MED ORDER — LIDOCAINE 2 % MUCOSAL SOLN
2 % | Freq: Once | Status: AC
Start: 2017-11-29 — End: 2017-11-28
  Administered 2017-11-29: 02:00:00 via ORAL

## 2017-11-29 MED FILL — ONDANSETRON (PF) 4 MG/2 ML INJECTION: 4 mg/2 mL | INTRAMUSCULAR | Qty: 2

## 2017-11-29 MED FILL — MAG-AL PLUS 200 MG-200 MG-20 MG/5 ML ORAL SUSPENSION: 200-200-20 mg/5 mL | ORAL | Qty: 30

## 2017-11-29 MED FILL — DICYCLOMINE 10 MG/ML IM SOLN: 10 mg/mL | INTRAMUSCULAR | Qty: 2

## 2017-11-29 MED FILL — FAMOTIDINE 20 MG TAB: 20 mg | ORAL | Qty: 1

## 2017-11-29 MED FILL — MORPHINE 4 MG/ML INTRAVENOUS SOLUTION: 4 mg/mL | INTRAVENOUS | Qty: 1

## 2017-12-01 ENCOUNTER — Encounter

## 2017-12-04 NOTE — Telephone Encounter (Signed)
Pt called stating she scheduled appointment with gastroenterologist for today at 3 pm, but wants Dr. Aleene Davidson to be aware she went to ER for abdominal pain on 11/28/17 and diarrhea.      Pt advised to also share this information with her gastroenterologist at her appointment today so records can be obtained and pt agrees to plan. Ldm

## 2017-12-14 ENCOUNTER — Ambulatory Visit: Attending: Internal Medicine | Primary: Family Medicine

## 2017-12-14 ENCOUNTER — Ambulatory Visit
Admit: 2017-12-14 | Discharge: 2017-12-14 | Payer: PRIVATE HEALTH INSURANCE | Attending: Internal Medicine | Primary: Family Medicine

## 2017-12-14 DIAGNOSIS — R079 Chest pain, unspecified: Secondary | ICD-10-CM

## 2017-12-14 NOTE — Progress Notes (Signed)
Alyssa [name] is a 37 y.o. female    Chief Complaint   Patient presents with   ??? New Patient     Ref by Dr. Alberteen Spindle   ??? Other     Possible stress test   ??? Palpitations       Chest pain NO    SOB Patients states having SOB with exertion.    Dizziness NO    Swelling NO    Refills NO    Visit Vitals  BP 134/80 (BP 1 Location: Left arm, BP Patient Position: Sitting)   Pulse 97   Resp 18   Ht 5\' 3"  (1.6 m)   Wt 262 lb 9.6 oz (119.1 kg)   LMP 03/08/2016 Comment: has heavy periods per patient   SpO2 96%   BMI 46.52 kg/m??       1. Have you been to the ER, urgent care clinic since your last visit?  Hospitalized since your last visit? YES, 11/28/17 for abdominal pain.    2. Have you seen or consulted any other health care providers outside of the [facility] since your last visit?  Include any pap smears or colon screening. No

## 2017-12-14 NOTE — Addendum Note (Signed)
Addended by: Stefano Gaul A on: 12/19/2017 10:07 AM     Modules accepted: Orders
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
## 2017-12-14 NOTE — Progress Notes (Signed)
 Alyssa [name] is a 37 y.o. female    Chief Complaint   Patient presents with   . New Patient     Ref by Dr. Gallahan   . Other     Possible stress test   . Palpitations       Chest pain NO    SOB Patients states having SOB with exertion.    Dizziness NO    Swelling NO    Refills NO    Visit Vitals  BP 134/80 (BP 1 Location: Left arm, BP Patient Position: Sitting)   Pulse 97   Resp 18   Ht 5' 3 (1.6 m)   Wt 262 lb 9.6 oz (119.1 kg)   LMP 03/08/2016 Comment: has heavy periods per patient   SpO2 96%   BMI 46.52 kg/m       1. Have you been to the ER, urgent care clinic since your last visit?  Hospitalized since your last visit? YES, 11/28/17 for abdominal pain.    2. Have you seen or consulted any other health care providers outside of the [facility] since your last visit?  Include any pap smears or colon screening. No
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
## 2017-12-14 NOTE — Addendum Note (Signed)
Addendum Note by Rosezena Sensor, LPN at 09/32/35 5732                Author: Rosezena Sensor, LPN  Service: --  Author Type: Licensed Nurse       Filed: 12/19/17 1007  Encounter Date: 12/14/2017  Status: Signed          Editor: Rosezena Sensor, LPN (Licensed Nurse)          Addended by: Stefano Gaul A on: 12/19/2017 10:07 AM    Modules accepted: Orders

## 2017-12-19 ENCOUNTER — Telehealth

## 2017-12-19 NOTE — Telephone Encounter (Signed)
Patient needs a 24 hour Holter monitor per Dr Eldridge Abrahams office visit on 12/14/17.

## 2017-12-20 ENCOUNTER — Encounter

## 2017-12-20 ENCOUNTER — Ambulatory Visit: Attending: Obstetrics & Gynecology | Primary: Family Medicine

## 2017-12-20 ENCOUNTER — Encounter: Admit: 2017-12-20 | Discharge: 2017-12-20 | Payer: PRIVATE HEALTH INSURANCE | Primary: Family Medicine

## 2017-12-20 ENCOUNTER — Ambulatory Visit
Admit: 2017-12-20 | Discharge: 2017-12-20 | Payer: PRIVATE HEALTH INSURANCE | Attending: Obstetrics & Gynecology | Primary: Family Medicine

## 2017-12-20 ENCOUNTER — Encounter: Admit: 2017-12-20 | Primary: Family Medicine

## 2017-12-20 DIAGNOSIS — R102 Pelvic and perineal pain: Secondary | ICD-10-CM

## 2017-12-20 NOTE — Patient Instructions (Signed)
Pelvic Pain: Care Instructions  Your Care Instructions    Pelvic pain, or pain in the lower belly, can have many causes. Often pelvic pain is not serious and gets better in a few days. If your pain continues or gets worse, you may need tests and treatment. Tell your doctor about any new symptoms. These may be signs of a serious problem.  Follow-up care is a key part of your treatment and safety. Be sure to make and go to all appointments, and call your doctor if you are having problems. It's also a good idea to know your test results and keep a list of the medicines you take.  How can you care for yourself at home?  ?? Rest until you feel better. Lie down, and raise your legs by placing a pillow under your knees.  ?? Drink plenty of fluids. You may find that small, frequent sips are easier on your stomach than if you drink a lot at once. Avoid drinks with carbonation or caffeine, such as soda pop, tea, or coffee.  ?? Try eating several small meals instead of 2 or 3 large ones. Eat mild foods, such as rice, dry toast or crackers, bananas, and applesauce. Avoid fatty and spicy foods, other fruits, and alcohol until 48 hours after your symptoms have gone away.  ?? Take an over-the-counter pain medicine, such as acetaminophen (Tylenol), ibuprofen (Advil, Motrin), or naproxen (Aleve). Read and follow all instructions on the label.  ?? Do not take two or more pain medicines at the same time unless the doctor told you to. Many pain medicines have acetaminophen, which is Tylenol. Too much acetaminophen (Tylenol) can be harmful.  ?? You can put a heating pad, a warm cloth, or moist heat on your belly to relieve pain.  When should you call for help?  Call your doctor now or seek immediate medical care if:  ?? ?? You have a new or higher fever.   ?? ?? You have unusual vaginal bleeding.   ?? ?? You have new or worse belly or pelvic pain.   ?? ?? You have vaginal discharge that has increased in amount or smells bad.    ??Watch closely for changes in your health, and be sure to contact your doctor if:  ?? ?? You do not get better as expected.   Where can you learn more?  Go to [email or web address].  Enter B514 in the search box to learn more about "Pelvic Pain: Care Instructions."  Current as of: June 06, 2017  Content Version: 12.1  ?? 2006-2019 [employer]. Care instructions adapted under license by [employer] (which disclaims liability or warranty for this information). If you have questions about a medical condition or this instruction, always ask your healthcare professional. [place] any warranty or liability for your use of this information.
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
## 2017-12-22 MED ORDER — VALACYCLOVIR 1 G TAB
1 gram | ORAL_TABLET | ORAL | 0 refills | Status: DC
Start: 2017-12-22 — End: 2018-01-12

## 2017-12-22 MED ORDER — OMEPRAZOLE 40 MG CAP, DELAYED RELEASE
40 mg | ORAL_CAPSULE | Freq: Every day | ORAL | 3 refills | Status: DC
Start: 2017-12-22 — End: 2018-03-26

## 2017-12-22 NOTE — Telephone Encounter (Signed)
38 year old patient seen in the office on 07/17/17 for problem visit.    Patient has up coming appointment on 02/01/18 .    Requested prescription last sent on 11/14/17.      ? Refill please advise

## 2017-12-22 NOTE — Telephone Encounter (Signed)
OK. Script signed

## 2017-12-22 NOTE — Telephone Encounter (Signed)
Prescription sent by MD

## 2017-12-25 NOTE — Telephone Encounter (Signed)
Refill sent 3 days ago, this is a duplicate. Perhaps a prior Josem Kaufmann is required and so then I recommend the patient consider getting this over the counter

## 2017-12-29 ENCOUNTER — Encounter

## 2017-12-29 ENCOUNTER — Institutional Professional Consult (permissible substitution): Admit: 2017-12-29 | Discharge: 2017-12-29 | Payer: PRIVATE HEALTH INSURANCE | Primary: Family Medicine

## 2017-12-29 DIAGNOSIS — R002 Palpitations: Secondary | ICD-10-CM

## 2017-12-29 NOTE — Progress Notes (Signed)
24hr holter monitor applied for palpitations per Dr Kandice Moos. Pt given instructions and verbalized understanding. Pt will return monitor tomorrow.

## 2017-12-29 NOTE — Progress Notes (Signed)
Applied 24 hr holter per Dr Kandice Moos dx: palps.  Pt has #[id number] & due back on 9/16.  Chargeable visit.  [employer]

## 2017-12-29 NOTE — Progress Notes (Signed)
24hr holter monitor applied for palpitations per Dr Donnella Bi. Pt given instructions and verbalized understanding. Pt will return monitor tomorrow.

## 2017-12-29 NOTE — Progress Notes (Signed)
Applied 24 hr holter per Dr Kandice Moos dx: palps.  Pt has #[id number] & due back on 9/16.  Chargeable visit.  [employer]

## 2017-12-30 ENCOUNTER — Inpatient Hospital Stay
Admit: 2017-12-30 | Discharge: 2017-12-30 | Disposition: A | Discharge status: Home / Self Care | Payer: PRIVATE HEALTH INSURANCE | Attending: Emergency Medicine

## 2017-12-30 DIAGNOSIS — L02214 Cutaneous abscess of groin: Secondary | ICD-10-CM

## 2017-12-30 MED ORDER — IBUPROFEN 800 MG TAB
800 mg | Freq: Once | ORAL | Status: AC
Start: 2017-12-30 — End: 2017-12-30
  Administered 2017-12-30: 15:00:00 via ORAL

## 2017-12-30 MED ORDER — DOXYCYCLINE HYCLATE 100 MG TAB
100 mg | ORAL_TABLET | Freq: Two times a day (BID) | ORAL | 0 refills | Status: AC
Start: 2017-12-30 — End: 2018-01-06

## 2017-12-30 MED ORDER — LIDOCAINE-EPINEPHRINE 1 %-1:100,000 IJ SOLN
1 %-:00,000 | Freq: Once | INTRAMUSCULAR | Status: AC
Start: 2017-12-30 — End: 2017-12-30
  Administered 2017-12-30: 15:00:00 via INTRADERMAL

## 2017-12-30 MED ORDER — OXYCODONE-ACETAMINOPHEN 5 MG-325 MG TAB
5-325 mg | Freq: Once | ORAL | Status: AC
Start: 2017-12-30 — End: 2017-12-30
  Administered 2017-12-30: 15:00:00 via ORAL

## 2017-12-30 MED ORDER — CEPHALEXIN 250 MG CAP
250 mg | ORAL_CAPSULE | Freq: Three times a day (TID) | ORAL | 0 refills | Status: AC
Start: 2017-12-30 — End: 2018-01-04

## 2017-12-30 MED FILL — IBUPROFEN 800 MG TAB: 800 mg | ORAL | Qty: 1

## 2017-12-30 MED FILL — OXYCODONE-ACETAMINOPHEN 5 MG-325 MG TAB: 5-325 mg | ORAL | Qty: 1

## 2017-12-30 MED FILL — LIDOCAINE-EPINEPHRINE 1 %-1:100,000 IJ SOLN: 1 %-:00,000 | INTRAMUSCULAR | Qty: 10
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
## 2017-12-30 NOTE — ED Notes (Signed)
Pt given discharge instructions by Dr Raiford Simmonds she verbalizes an understanding pt stable at time of discharge ambulates to car with Aunt

## 2017-12-30 NOTE — ED Triage Notes (Signed)
Pt ambulates to treatment area she states that for the past 2 days she has had a abscess to her private area that seemed to grow overnight.  She states it is on the left side of her "private area"  She denies any fever or chills

## 2017-12-30 NOTE — ED Notes (Addendum)
Assisted Dr Patrica Duel with I&D, pt tolerates well

## 2017-12-30 NOTE — ED Notes (Signed)
 Pt ambulates to treatment area she states that for the past 2 days she has had a abscess to her private area that seemed to grow overnight.  She states it is on the left side of her private area  She denies any fever or chills

## 2017-12-30 NOTE — ED Notes (Signed)
Assisted Dr Lilla Shook with I&D, pt tolerates well
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
## 2018-01-01 NOTE — Progress Notes (Addendum)
CCM progress note    Patient eligible for [place] care management    Two patient identifiers verified. Verified DOB and address for HIPAA security.    [employer] employee - registrar    RRAT Score - 0    Patient reported she is doing better today. Reported packing fell out while taking a bath, not able to do a sits bath. Discusses concerns related to taking a tub bath versus alternatives such as taking a shower and using a hand held shower spray. Encouraged pt to attempt to repack wound after flushing out wound with shower spray. Reported 2/10 on pain scale without pain med when walking. Reported "burnt orange drainage with no odor coming from wound." BS - 135 this AM. Only checking daily in AM. Encouraged to check BID. Taking Lantus 70 Units QHS.    Patient denies C/P, SOB, cough, wheezing, fever, pain/swelling of legs or feet, N/V, diarrhea, difficulty urinating or constipation. Appetite/hydration good.       Discussed the care management program.  Patient agrees to care management services at this time.     Patient's primary care provider relationship reviewed with patient and modified, as applicable.    Patient has significant diagnosis of Abscess of left groin and diabetes .      Care management assessment completed:    Patient stated problem: " Abscess of left groin, but getting better"    Care manager identified primary concern -  Abscess of left groin, packing fell out and need f/u with provider in 3 days.      Emotional Status/Adjustment to Health State: good    Readiness to Change: []   Pre-contemplative    []   Contemplative  [x]   Preparation               []   Action                  []   Maintenance    Barriers/Challenges to Care: []   Decline in memory    []   Language barrier     []   Emotional                  []   Limited mobility  []   Lack of motivation     []  Vision, hearing or cognitive impairment [x]   Knowledge []  Financial Barriers  []   Other      Patient stated goal - will schedule f/u from ER appt within 3 days    Care Manager/Provider identified medical goal - monitor for red flags and schedule ER TOC appt within 3 days    Support System/Resources: - family    Advance Care Planning: deferred     ADLS/DME: none    Referrals: OB/GYN      Upcoming appointments:   Future Appointments   Date Time Provider [place]   01/19/2018  1:00 PM Margette Fast, MD [place]   02/01/2018  3:00 PM Merlene Morse [facility] SCHED   02/01/2018  3:30 PM Earna Coder, MD [facility]:    1. Diagnosis 1-  Abscess of left groin     2. Diagnosis 2- Type 2 diabetes on insulin    3. Focused Assessment- Surgical/Wound Condition Focused Assessment    Skin- any open wounds or incisions? yes  Description and location of wound- left groin  In the last 24 hour have you experienced;    Fever no  Low body temperature no    Chills or shaking no    Sweating no    Fast heart rate no    Fast breathing no    Dizziness/lightheadedness no    Confusion or unusual change in mental status no    Diarrhea no    Nausea no    Vomiting no    Shortness of breath or difficulty breathing no    Less urine output no    Cold, clammy, and pale skin no     Skin rash or skin color changes no  New or worsening pain? yes  If yes, pain rated 0-10: 2 Location/pain characteristics: left groin   New or worsening numbness or tingling? no  If yes, location of numbness and tingling: n/a    Patient reported important numbers to know:  Temperature afebrile   Heart rate 18   Last BP 154/72   Weight 262     Activity level- moving several times a day, or as recommended? yes  Abnormal activity level reported: no   Bathing and showering instructions? yes  Nutrition- prescribed diet? yes   Tolerating food? yes  Hydration- (how much in ounces per day) >16 oz day  Medications- new antibiotic? yes             Pain medication? no   Does patient understand how and when to take their medications? yes  If no, instructed patient on proper medication use? n/a  Does patient have incentive spirometer? no  If yes, how frequently is patient using incentive spirometer? n/a      4. Key pt activities to achieve better health:   [x]   Weight loss  [x]   Improved diabetic control  []   Decreased cholesterol levels  [x]   Decreased blood pressure  []     []     Patient verbalized understanding of all information discussed.    Pt has my contact information for any further questions, concerns, or needs.    Plan for next call:  1 week

## 2018-01-01 NOTE — Progress Notes (Signed)
CCM progress note    Patient eligible for [facility] Employee care management    Two patient identifiers verified. Verified DOB and address for HIPAA security.    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 Reported packing fell out while taking a bath, not able to do a sits bath. Discusses concerns related to taking a tub bath versus alternatives such as taking a shower and using a hand held shower spray. Encouraged pt to attempt to repack wound after flushing out wound with shower spray. Reported 2/10 on pain scale without pain med when walking. Reported "burnt orange drainage with no odor coming from wound." BS - 135 this AM. Only checking daily in AM. Encouraged to check BID. Taking Lantus 70 Units QHS.    Patient denies C/P, SOB, cough, wheezing, fever, pain/swelling of legs or feet, N/V, diarrhea, difficulty urinating or constipation. Appetite/hydration good.       Discussed the care management program.  Patient agrees to care management services at this time.     Patient's primary care provider relationship reviewed with patient and modified, as applicable.    Patient has significant diagnosis of Abscess of left groin and diabetes .      Care management assessment completed:    Patient stated problem: " Abscess of left groin, but getting better"    Care manager identified primary concern -  Abscess of left groin, packing fell out and need f/u with provider in 3 days.      Emotional Status/Adjustment to Health State: good    Readiness to Change: []   Pre-contemplative    []   Contemplative  [x]   Preparation               []   Action                  []   Maintenance    Barriers/Challenges to Care: []   Decline in memory    []   Language barrier     []   Emotional                  []   Limited mobility  []   Lack of motivation     []  Vision, hearing or cognitive impairment [x]   Knowledge []  Financial Barriers  []   Other     Patient stated goal - will schedule f/u  from ER appt within 3 days    Care Manager/Provider identified medical goal - monitor for red flags and schedule ER TOC appt within 3 days    Support System/Resources: - family    Advance Care Planning: deferred     ADLS/DME: none    Referrals: OB/GYN      Upcoming appointments:   Future Appointments   Date Time Provider Department Center   01/19/2018  1:00 PM Lilli Few, MD [facility] SCHED   02/01/2018  3:00 PM Hope Budds [facility] SCHED   02/01/2018  3:30 PM Jossie Ng, MD [facility]       Care Plan for Chronic Disease:    1. Diagnosis 1-  Abscess of left groin     2. Diagnosis 2- Type 2 diabetes on insulin    3. Focused Assessment- Surgical/Wound Condition Focused Assessment    Skin- any open wounds or incisions? yes  Description and location of wound- left groin  In the last 24 hour have you experienced;    Fever no  Low body temperature no    Chills or shaking no    Sweating no    Fast heart rate no    Fast breathing no    Dizziness/lightheadedness no    Confusion or unusual change in mental status no    Diarrhea no    Nausea no    Vomiting no    Shortness of breath or difficulty breathing no    Less urine output no    Cold, clammy, and pale skin no     Skin rash or skin color changes no  New or worsening pain? yes  If yes, pain rated 0-10: 2 Location/pain characteristics: left groin   New or worsening numbness or tingling? no  If yes, location of numbness and tingling: n/a    Patient reported important numbers to know:  Temperature afebrile   Heart rate 18   Last BP 154/72   Weight 262     Activity level- moving several times a day, or as recommended? yes  Abnormal activity level reported: no   Bathing and showering instructions? yes  Nutrition- prescribed diet? yes   Tolerating food? yes  Hydration- (how much in ounces per day) >16 oz day  Medications- new antibiotic? yes             Pain medication? no  Does patient understand how and when to take their medications? yes  If  no, instructed patient on proper medication use? n/a  Does patient have incentive spirometer? no  If yes, how frequently is patient using incentive spirometer? n/a      4. Key pt activities to achieve better health:   [x]   Weight loss  [x]   Improved diabetic control  []   Decreased cholesterol levels  [x]   Decreased blood pressure  []     []     Patient verbalized understanding of all information discussed.    Pt has my contact information for any further questions, concerns, or needs.    Plan for next call:  1 week

## 2018-01-05 NOTE — Telephone Encounter (Signed)
Patient called would like to know results from her 24 hr Holter from 12/29/17.  I have put the report on your desk for review.    Please advise.

## 2018-01-08 NOTE — Progress Notes (Signed)
Discussed with patient about Holter monitor report 12/29/2017 minimum heart rate 72 bpm maximum heart rate 163 bpm.  Ventricular ectopic-2, supraventricular ectopics-1.  One sinus pause-3 seconds at 2306 hrs..  No diary entries.    Will need E cardio event monitor-we will discuss on follow-up visit.  We will also need to be evaluated for OSA    Discussed with patient by telephone.

## 2018-01-10 NOTE — Progress Notes (Addendum)
CCM Outreach    Call placed to pt, Verified DOB and address for HIPAA security.    Goals     ??? Attends follow-up appointments as directed.      Pt will call OBGYN for ER f/u appt within 3 days for f/u on wound. Cardio f/u - 01/19/18, PCP - TBD    01/10/18 pt did not f/u with provider as instructed, "busy working." advised pt to f/u ASAP with PCP for wound recheck and to f/u on diabetes, agreed       ??? Care Coodination (pt-stated)      Pt will f/u with provider in 3 days  Pt will f/u with PCP to address gaps in care and check Lipid    01/10/18 PCP f/u - TBD,  cardio 01/16/18, GYN - 02/01/18          ??? Knowledge and adherence to medication plan.     ??? Patient verbalizes understanding of self -management goals of living with Diabetes.      Pt will monitor BS at least BID, take diabetic medication as prescribed and work with dietician to assist with wt loss and assist with lowering A1C.    01/10/18 Pt is not monitoring BS's as advised. Pt didn't check this AM, but did check last night. BS 130. Pt reported she is now taking 75 units of Lantus, up from 70 units. Advised pt to f/u withy PCP and to request PCP fax order over order to Diabetic treatment [facility]. Pt may also benefit from seeing Endo. Advised pt to discuss with PCP.        ??? Understands red flags post discharge.      Increased pain, swelling, warmth, or redness.  Red streaks leading from the infected skin.  Pus draining from the wound.  A fever.    01/10/18 pt denies increased pain, swelling, warmth, or redness, red streaks leading from the infected skin, pus draining from the wound or fever. Pt reported wound has closed.            CM will f/u with pt in 1 week

## 2018-01-10 NOTE — Progress Notes (Signed)
 CCM Outreach    Call placed to pt, Verified DOB and address for HIPAA security.    Goals     . Attends follow-up appointments as directed.      Pt will call OBGYN for ER f/u appt within 3 days for f/u on wound. Cardio f/u - 01/19/18, PCP - TBD    01/10/18 pt did not f/u with provider as instructed, busy working. advised pt to f/u ASAP with PCP for wound recheck and to f/u on diabetes, agreed       . Care Coodination (pt-stated)      Pt will f/u with provider in 3 days  Pt will f/u with PCP to address gaps in care and check Lipid    01/10/18 PCP f/u - TBD,  cardio 01/16/18, GYN - 02/01/18          . Knowledge and adherence to medication plan.     . Patient verbalizes understanding of self -management goals of living with Diabetes.      Pt will monitor BS at least BID, take diabetic medication as prescribed and work with dietician to assist with wt loss and assist with lowering A1C.    01/10/18 Pt is not monitoring BS's as advised. Pt didn't check this AM, but did check last night. BS 130. Pt reported she is now taking 75 units of Lantus , up from 70 units. Advised pt to f/u withy PCP and to request PCP fax order over order to Diabetic treatment [facility]. Pt may also benefit from seeing Endo. Advised pt to discuss with PCP.        [name] Understands red flags post discharge.      Increased pain, swelling, warmth, or redness.  Red streaks leading from the infected skin.  Pus draining from the wound.  A fever.    01/10/18 pt denies increased pain, swelling, warmth, or redness, red streaks leading from the infected skin, pus draining from the wound or fever. Pt reported wound has closed.            CM will f/u with pt in 1 week

## 2018-01-11 NOTE — Telephone Encounter (Signed)
Katrina from [place] authorization department is calling to inform that patient's secondary insurance [facility]) is denying coverage for Nuc stress test due to needing more information and a peer to peer @ [place] can be reached at [phone]

## 2018-01-12 MED ORDER — VALACYCLOVIR 1 G TAB
1 gram | ORAL_TABLET | ORAL | 0 refills | Status: DC
Start: 2018-01-12 — End: 2018-01-19

## 2018-01-12 NOTE — Telephone Encounter (Signed)
Prescription sent by MD to patient preferred pharmacy.

## 2018-01-12 NOTE — Telephone Encounter (Signed)
Signed.

## 2018-01-12 NOTE — Telephone Encounter (Signed)
37 year old patient last seen in the office on 12/20/17.    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 Refill as pended

## 2018-01-15 ENCOUNTER — Ambulatory Visit: Payer: PRIVATE HEALTH INSURANCE | Primary: Family Medicine

## 2018-01-15 ENCOUNTER — Encounter: Attending: Obstetrics & Gynecology | Primary: Family Medicine

## 2018-01-15 NOTE — Telephone Encounter (Addendum)
Katrina from [facility] authorization department is following up on the peer peer request from the patients secondary insurance. Patient is scheduled for tomorrow..  Please advise.     Peer to peer @ [place] Phone: [phone]

## 2018-01-15 NOTE — Telephone Encounter (Signed)
Called and spoke with Katrina from [facility] authorization department the [employer] stress has been cancelled tomorrow due to secondary insurance - Medicaid requesting more information.    I called patient no answer, LM/Vm that tests have been cancelled for tomorrow and will reschedule once we hear back from insurance.

## 2018-01-16 ENCOUNTER — Ambulatory Visit: Payer: PRIVATE HEALTH INSURANCE | Primary: Family Medicine

## 2018-01-16 ENCOUNTER — Inpatient Hospital Stay: Payer: PRIVATE HEALTH INSURANCE | Attending: Internal Medicine | Primary: Family Medicine

## 2018-01-16 NOTE — Telephone Encounter (Signed)
Pt stating she was r/t Theresa's call from [phone].     [employer]

## 2018-01-17 ENCOUNTER — Ambulatory Visit: Payer: PRIVATE HEALTH INSURANCE | Primary: Family Medicine

## 2018-01-17 NOTE — Progress Notes (Addendum)
CCM Outreach    Call placed to pt, Verified DOB and address for HIPAA security.    Goals     ??? Attends follow-up appointments as directed.      Pt will call OBGYN for ER f/u appt within 3 days for f/u on wound. Cardio f/u - 01/19/18, PCP - TBD    01/10/18 pt did not f/u with provider as instructed, "busy working." advised pt to f/u ASAP with PCP for wound recheck and to f/u on diabetes, agreed     01/17/18 Pt reported she has scheduled f/u wiuth PCP and GYN, but does not know the date. Unable to provide date since she is driving. Scheduled to f/u with cardio 01/19/18 to address holter monitor and the need to be evaluated for OSA; GYN - 02/01/18 and [place] of abdomen. According to CC, pt has rescheduled appt's multiple times.       ??? Care Coodination (pt-stated)      Pt will f/u with provider in 3 days  Pt will f/u with PCP to address gaps in care and check Lipid    01/10/18 PCP f/u - TBD,  cardio 01/16/18, GYN - 02/01/18          ??? Knowledge and adherence to medication plan.     ??? Patient verbalizes understanding of self -management goals of living with Diabetes.      Pt will monitor BS at least BID, take diabetic medication as prescribed and work with dietician to assist with wt loss and assist with lowering A1C.    01/10/18 Pt is not monitoring BS's as advised. Pt didn't check this AM, but did check last night. BS 130. Pt reported she is now taking 75 units of Lantus, up from 70 units. Advised pt to f/u withy PCP and to request PCP fax order over order to [facility]. Pt may also benefit from seeing Endo. Advised pt to discuss with PCP.      01/17/18 pt continues to not check BS as advised. Pt has not checked BS today. Last BS was yesterday AM - 130. Pt verbalizes that she understands the importance, but states she is too busy to check or falls asleep before checking.      ??? Understands red flags post discharge.      Increased pain, swelling, warmth, or redness.  Red streaks leading from the infected skin.   Pus draining from the wound.  A fever.    01/10/18 pt denies increased pain, swelling, warmth, or redness, red streaks leading from the infected skin, pus draining from the wound or fever. Pt reported wound has closed.    01/17/18 pt denies further issues with abscess's or wounds. Denies temp. Pt denies CP or SOB at this time. Pt does report "fluid in my abdomen," scheduled for [place]. Reported last wt wass 255 lb            CM will f/u in 1 week

## 2018-01-17 NOTE — Progress Notes (Signed)
Formatting of this note is different from the original.  CCM Outreach    Call placed to pt, Verified DOB and address for HIPAA security.    Goals     ? Attends follow-up appointments as directed.      Pt will call OBGYN for ER f/u appt within 3 days for f/u on wound. Cardio f/u - 01/19/18, PCP - TBD    01/10/18 pt did not f/u with provider as instructed, "busy working." advised pt to f/u ASAP with PCP for wound recheck and to f/u on diabetes, agreed     01/17/18 Pt reported she has scheduled f/u wiuth PCP and GYN, but does not know the date. Unable to provide date since she is driving. Scheduled to f/u with cardio 01/19/18 to address holter monitor and the need to be evaluated for OSA; GYN - 02/01/18 and [place] of abdomen. According to CC, pt has rescheduled appt's multiple times.      ? Care Coodination (pt-stated)      Pt will f/u with provider in 3 days  Pt will f/u with PCP to address gaps in care and check Lipid    01/10/18 PCP f/u - TBD,  cardio 01/16/18, GYN - 02/01/18       ? Knowledge and adherence to medication plan.     ? Patient verbalizes understanding of self -management goals of living with Diabetes.      Pt will monitor BS at least BID, take diabetic medication as prescribed and work with dietician to assist with wt loss and assist with lowering A1C.    01/10/18 Pt is not monitoring BS's as advised. Pt didn't check this AM, but did check last night. BS 130. Pt reported she is now taking 75 units of Lantus, up from 70 units. Advised pt to f/u withy PCP and to request PCP fax order over order to [facility]. Pt may also benefit from seeing Endo. Advised pt to discuss with PCP.      01/17/18 pt continues to not check BS as advised. Pt has not checked BS today. Last BS was yesterday AM - 130. Pt verbalizes that she understands the importance, but states she is too busy to check or falls asleep before checking.     ? Understands red flags post discharge.      Increased pain, swelling, warmth, or  redness.  Red streaks leading from the infected skin.  Pus draining from the wound.  A fever.    01/10/18 pt denies increased pain, swelling, warmth, or redness, red streaks leading from the infected skin, pus draining from the wound or fever. Pt reported wound has closed.    01/17/18 pt denies further issues with abscess's or wounds. Denies temp. Pt denies CP or SOB at this time. Pt does report "fluid in my abdomen," scheduled for [place]. Reported last wt wass 255 lb          CM will f/u in 1 week      Electronically signed by Mosetta Putt, RN at 01/17/2018  8:59 PM EDT

## 2018-01-17 NOTE — Progress Notes (Signed)
CCM Outreach    Call placed to pt, Verified DOB and address for HIPAA security.    Goals     . Attends follow-up appointments as directed.      Pt will call OBGYN for ER f/u appt within 3 days for f/u on wound. Cardio f/u - 01/19/18, PCP - TBD    01/10/18 pt did not f/u with provider as instructed, "busy working." advised pt to f/u ASAP with PCP for wound recheck and to f/u on diabetes, agreed     01/17/18 Pt reported she has scheduled f/u wiuth PCP and GYN, but does not know the date. Unable to provide date since she is driving. Scheduled to f/u with cardio 01/19/18 to address holter monitor and the need to be evaluated for OSA; GYN - 02/01/18 and [place] of abdomen. According to CC, pt has rescheduled appt's multiple times.       . Care Coodination (pt-stated)      Pt will f/u with provider in 3 days  Pt will f/u with PCP to address gaps in care and check Lipid    01/10/18 PCP f/u - TBD,  cardio 01/16/18, GYN - 02/01/18          . Knowledge and adherence to medication plan.     . Patient verbalizes understanding of self -management goals of living with Diabetes.      Pt will monitor BS at least BID, take diabetic medication as prescribed and work with dietician to assist with wt loss and assist with lowering A1C.    01/10/18 Pt is not monitoring BS's as advised. Pt didn't check this AM, but did check last night. BS 130. Pt reported she is now taking 75 units of Lantus, up from 70 units. Advised pt to f/u withy PCP and to request PCP fax order over order to [facility]. Pt may also benefit from seeing Endo. Advised pt to discuss with PCP.      01/17/18 pt continues to not check BS as advised. Pt has not checked BS today. Last BS was yesterday AM - 130. Pt verbalizes that she understands the importance, but states she is too busy to check or falls asleep before checking.      . Understands red flags post discharge.      Increased pain, swelling, warmth, or redness.  Red streaks leading from the infected skin.  Pus  draining from the wound.  A fever.    01/10/18 pt denies increased pain, swelling, warmth, or redness, red streaks leading from the infected skin, pus draining from the wound or fever. Pt reported wound has closed.    01/17/18 pt denies further issues with abscess's or wounds. Denies temp. Pt denies CP or SOB at this time. Pt does report "fluid in my abdomen," scheduled for [place]. Reported last wt wass 255 lb            CM will f/u in 1 week

## 2018-01-18 NOTE — Telephone Encounter (Signed)
Called patient and she would like to have the test done but to only bill her primary insurance not secondary.    Patient is scheduled for the Echo on 01/25/18 arrive at 8:30 am the first floor of the hospital and following that 10:00 she will proceed with the Nuclear Stress.    Patient verbalized understanding.

## 2018-01-19 ENCOUNTER — Ambulatory Visit: Attending: Internal Medicine | Primary: Family Medicine

## 2018-01-19 ENCOUNTER — Ambulatory Visit
Admit: 2018-01-19 | Discharge: 2018-01-19 | Payer: PRIVATE HEALTH INSURANCE | Attending: Internal Medicine | Primary: Family Medicine

## 2018-01-19 DIAGNOSIS — I10 Essential (primary) hypertension: Secondary | ICD-10-CM

## 2018-01-19 NOTE — Addendum Note (Signed)
Addended by: Stefano Gaul A on: 01/22/2018 06:00 PM     Modules accepted: Orders

## 2018-01-19 NOTE — Progress Notes (Signed)
Chief Complaint   Patient presents with   ??? Follow-up     Patient presents today for a follow up visit for CP, HTN and Dyspnea   ??? Chest Pain   ??? Hypertension     Visit Vitals  BP 132/78 (BP 1 Location: Left arm, BP Patient Position: Sitting)   Pulse 100   Resp 18   Ht 5\' 3"  (1.6 m)   Wt 260 lb 12.8 oz (118.3 kg)   SpO2 97%   BMI 46.20 kg/m??         Chest pain denied  SOB - denied  Dizziness denied  Swelling/Edema - denied  Recent hospital visit - 12/2017 Abscess of L Groin  Refills denied

## 2018-01-19 NOTE — Progress Notes (Addendum)
Alyssa Fast, MD    [address] [place] [place] [address]    Office [phone]  Pager [phone]    Alyssa [name] is a 37 y.o. female is here for f/u visit.    Primary care physician:  Madelin Rear, MD    Patient Active Problem List   Diagnosis Code   ??? Essential hypertension I10   ??? Hyperlipidemia E78.5   ??? Obesity, morbid ([place]) E66.01   ??? SUI (stress urinary incontinence, female) N39.3   ??? Pelvic pain R10.2   ??? Type 2 diabetes mellitus with complication, without long-term current use of insulin ([facility]) E11.8   ??? Incisional hernia, without obstruction or gangrene K43.2   ??? Abdominal wall seroma S30.1XXA   ??? Pelvic ascites R18.8   ??? Post-operative pain G89.18   ??? Nausea and vomiting R11.2   ??? Type 2 diabetes mellitus with diabetic neuropathy [facility]) E11.40       Dear Dr Aleene Davidson,  ??  I had the pleasure of seeing Alyssa [name] in the office today.  ??  ??  Assessment:  ??  Dyspnea/chest tightness  Palpitations  DM   Hypertension hyperlipidemia  Morbidly obese  History of GERD  Clinically has OSA-  ??24 hrs Holter monitor showed - 3 sec sinuspause; Rare PVC/PAC  ??  Plan:   ??  [facility] nuclear study  Echocardiogram  E cardio event Holter monitor  Blood pressure controlled  Patient already understands that she needs to lose weight.  Sleep study referral  Aggressive cardiovascular risk factor modification.  Follow-up in 2 to 3 weeks or earlier as needed  ??  Patient understands the plan. All questions were answered to the patient's satisfaction.  ??  Medication Side Effects and Warnings were discussed with patient: yes  Patient Labs were reviewed and or requested:  yes  Patient Past Records were reviewed and or requested: yes  ??  I appreciate the opportunity to be involved in [facility]. Please see note below for details. Please do not hesitate to contact us with questions or concerns.  ??  Alyssa Fast, MD  Cardiac Testing/ Procedures:    A.Cardiac Cath/PCI:     B.ECHO/TEE:    C.StressNuclear/Stress ECHO/Stress test:    D.Vascular:    E. HK:VQQVZD monitor report 12/29/2017 minimum heart rate 72 bpm maximum heart rate 163 bpm.  Ventricular ectopic-2, supraventricular ectopics-1.  One sinus pause-3 seconds at 2306 hrs..  No diary entries.    F. Miscellaneous:    Subjective:  Alyssa [name] is a 37 y.o. female who returns for follow up visit.         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  Climbing a flight of stairs causes dyspnea.  No rest pain.  Chest tightness is moderate in intensity.  Relieved spontaneously.  No swelling lower extremities.  June 2019 and had an episode where she had palpitations [facility]  Work-up was negative but she was advised to follow-up with cardiology.  Has not had any recurrence of palpitations.  No dizziness/syncope.  He is also scheduled to undergo endoscopic procedure under general anesthesia and is here for a preop evaluation.  No prior history of CAD.)    ROS:  (bold if positive, if negative)  Medications before admission:    Current Outpatient Medications   Medication Sig Dispense   ??? gabapentin (NEURONTIN) 300 mg capsule     ??? omeprazole (PRILOSEC) 40 mg capsule Take 1 Cap by mouth daily. 30 Cap   ??? lisinopril (PRINIVIL, ZESTRIL) 10 mg tablet TAKE 1 TABLET BY MOUTH EVERY DAY 30 Tab   ??? metFORMIN ER (GLUCOPHAGE XR) 500 mg tablet TAKE 2 TABLETS BY MOUTH DAILY WITH DINNER 60 Tab   ??? acetaminophen (TYLENOL ARTHRITIS PAIN) 650 mg TbER Take 650 mg by mouth every eight (8) hours.    ??? TRUE METRIX GLUCOSE METER misc USE TO CHECK BLOOD SUGAR 1 TO 3 TIMES A DAY AS DIRECTED    ??? Blood-Glucose Meter monitoring kit Check blood sugar 1-3 times daily as directed by physician E 11.9 1 Kit    ??? glucose blood VI test strips (BLOOD GLUCOSE TEST) strip Check blood sugar 1-3 times daily as directed by physician E 11.9 100 Strip   ??? insulin glargine (LANTUS,BASAGLAR) 100 unit/mL (3 mL) inpn 10 U qhs. Increase by 2 U every 2 Days until FBG <120, call office to report. E 11.9 5 Adjustable Dose Pre-filled Pen Syringe   ??? Insulin Needles, Disposable, 31 gauge x 5/16" ndle Use with Lantus Solostar pen 1 Package   ??? lancets misc Blood sugar 1-3 times daily as directed by physician E 11.9 1 Each   ??? atorvastatin (LIPITOR) 40 mg tablet Take 1 Tab by mouth daily. 90 Tab   ??? aspirin-acetaminophen-caffeine (EXCEDRIN ES) 250-250-65 mg per tablet Take 1 Tab by mouth every six (6) hours as needed.    ??? ibuprofen (MOTRIN) 600 mg tablet every six (6) hours as needed.    ??? inhalational spacing device 1 Each by Does Not Apply route as needed. 1 Device   ??? albuterol (PROVENTIL HFA, VENTOLIN HFA, PROAIR HFA) 90 mcg/actuation inhaler Take 2 Puffs by inhalation every four (4) hours as needed for Wheezing. 1 Inhaler   ??? valACYclovir (VALTREX) 1 gram tablet Take 1 Tab by mouth two (2) times a day. 28 Tab     No current facility-administered medications for this visit.        Family History of CAD:    No    Social History:  Current  Smoker  No    Physical Exam:  Visit Vitals  BP 132/78 (BP 1 Location: Left arm, BP Patient Position: Sitting)   Pulse 100   Resp 18   Ht 5' 3"  (1.6 m)   Wt 260 lb 12.8 oz (118.3 kg)   LMP 03/08/2016 Comment: has heavy periods per patient   SpO2 97%   BMI 46.20 kg/m??          Gen: Well-developed, well-nourished, in no acute distress  Neck: Supple,No JVD, No Carotid Bruit,   Resp: No accessory muscle use, Clear breath sounds, No rales or rhonchi  Card: Regular Rate,Rythm,Normal S1, S2, No murmurs, rubs or gallop. No thrills.   Abd:  Soft, non-tender, non-distended,BS+,   MSK: No cyanosis  Skin: No rashes    Neuro: moving all four extremities , follows commands appropriately   Psych:  Good insight, oriented to person, place , alert, Nml Affect  LE: No edema    EKG:       LABS:        Lab Results   Component Value Date/Time    WBC 8.4 11/28/2017 07:50 PM    HGB 12.1 11/28/2017 07:50 PM    HCT 36.8  11/28/2017 07:50 PM    PLATELET 341 11/28/2017 07:50 PM     Lab Results   Component Value Date/Time    Sodium 134 (L) 11/28/2017 07:50 PM    Potassium 4.1 11/28/2017 07:50 PM    Chloride 100 11/28/2017 07:50 PM    CO2 23 11/28/2017 07:50 PM    Anion gap 11 11/28/2017 07:50 PM    Glucose 314 (H) 11/28/2017 07:50 PM    BUN 15 11/28/2017 07:50 PM    Creatinine 0.93 11/28/2017 07:50 PM    BUN/Creatinine ratio 16 11/28/2017 07:50 PM    GFR est AA >60 11/28/2017 07:50 PM    GFR est non-AA >60 11/28/2017 07:50 PM    Calcium 9.1 11/28/2017 07:50 PM       Lab Results   Component Value Date/Time    aPTT 30.7 11/02/2016 09:28 AM     Lab Results   Component Value Date/Time    INR 0.9 11/02/2016 09:28 AM    Prothrombin time 9.7 11/02/2016 09:28 AM     No components found for: LAST LIPIDS    ATTENTION:   This medical record was transcribed using an electronic medical records/speech recognition system.  Although proofread, it may and can contain electronic, spelling and other errors.  Corrections may be executed at a later time.  Please feel free to contact us for any clarifications as needed.      Alyssa Fast, MD

## 2018-01-19 NOTE — Progress Notes (Signed)
Chief Complaint   Patient presents with   . Follow-up     Patient presents today for a follow up visit for CP, HTN and Dyspnea   . Chest Pain   . Hypertension     Visit Vitals  BP 132/78 (BP 1 Location: Left arm, BP Patient Position: Sitting)   Pulse 100   Resp 18   Ht 5\' 3"  (1.6 m)   Wt 260 lb 12.8 oz (118.3 kg)   SpO2 97%   BMI 46.20 kg/m         Chest pain denied  SOB - denied  Dizziness denied  Swelling/Edema - denied  Recent hospital visit - 12/2017 Abscess of L Groin  Refills denied

## 2018-01-19 NOTE — Addendum Note (Signed)
Addendum Note by Rosezena Sensor, LPN at 42/59/56 [id number]                Author: Rosezena Sensor, LPN  Service: --  Author Type: Licensed Nurse       Filed: 01/22/18 1800  Encounter Date: 01/19/2018  Status: Signed          Editor: Rosezena Sensor, LPN (Licensed Nurse)          Addended by: Stefano Gaul A on: 01/22/2018 06:00 PM    Modules accepted: Orders

## 2018-01-19 NOTE — Progress Notes (Signed)
Progress Notes by Margette Fast, MD at 01/19/18 1300                Author: Margette Fast, MD  Service: --  Author Type: Physician       Filed: 01/19/18 1416  Encounter Date: 01/19/2018  Status: Addendum          Editor: Margette Fast, MD (Physician)          Related Notes: Original Note by Margette Fast, MD (Physician) filed at 01/19/18 [place], MD      [address] [place] [address]      Office [phone]   Pager [phone]      Alyssa [name] is a 37 y.o.  female is here for f/u visit.      Primary care physician:   Madelin Rear, MD        Patient Active Problem List        Diagnosis  Code         ?  Essential hypertension  I10     ?  Hyperlipidemia  E78.5     ?  Obesity, morbid ([facility])  E66.01     ?  SUI (stress urinary incontinence, female)  N39.3     ?  Pelvic pain  R10.2     ?  Type 2 diabetes mellitus with complication, without long-term current use of insulin (HCC)  E11.8     ?  Incisional hernia, without obstruction or gangrene  K43.2     ?  Abdominal wall seroma  S30.1XXA     ?  Pelvic ascites  R18.8     ?  Post-operative pain  G89.18     ?  Nausea and vomiting  R11.2         ?  Type 2 diabetes mellitus with diabetic neuropathy [facility])  E11.40           Dear Dr Aleene Davidson,   ??   I had the pleasure of seeing Alyssa [name] in the office today.   ??   ??   Assessment:   ??   Dyspnea/chest tightness   Palpitations   DM    Hypertension hyperlipidemia   Morbidly obese   History of GERD   Clinically has OSA-   ??24 hrs Holter monitor showed - 3 sec sinuspause; Rare PVC/PAC   ??   Plan:    ??   Lexi nuclear study   Echocardiogram   E cardio event Holter monitor   Blood pressure controlled   Patient already understands that she needs to lose weight.   Sleep study referral   Aggressive cardiovascular risk factor modification.   Follow-up in 2 to 3 weeks or earlier as needed   ??   Patient understands  the plan. All questions were answered to the patient's satisfaction.   ??   Medication Side Effects and Warnings were discussed with patient: yes   Patient Labs were reviewed and or requested:  yes   Patient Past Records were reviewed and or requested: yes   ??   I appreciate the opportunity to be involved in [facility]. Please see note below for details. Please do not hesitate to contact us with questions or concerns.   ??   Margette Fast, MD   Cardiac Testing/  Procedures:      A.Cardiac Cath/PCI:      B.ECHO/TEE:      C.StressNuclear/Stress ECHO/Stress test:      D.Vascular:      E. XJ:DBZMCE monitor report 12/29/2017 minimum heart rate 72 bpm maximum heart rate 163 bpm.   Ventricular ectopic-2, supraventricular ectopics-1.  One sinus pause-3 seconds at 2306 hrs..  No diary entries.      F. Miscellaneous:      Subjective:   Alyssa [name] is a 37 y.o.  female who returns for follow up visit.             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  Climbing a flight of stairs causes dyspnea.  No rest pain.  Chest tightness  is moderate in intensity.  Relieved spontaneously.  No swelling lower extremities.  June 2019 and had an episode where she had palpitations [facility]  Work-up was negative but she was advised to follow-up with cardiology.   Has not had any recurrence of palpitations.  No dizziness/syncope.  He is also scheduled to undergo endoscopic procedure under general anesthesia and is here for a preop evaluation.  No prior history of CAD.)      ROS:   (bold if positive,  if negative)                Medications before admission:        Current Outpatient Medications         Medication  Sig  Dispense          ?  gabapentin (NEURONTIN) 300 mg capsule         ?  omeprazole (PRILOSEC) 40 mg capsule  Take 1 Cap by  mouth daily.  30 Cap     ?  lisinopril (PRINIVIL, ZESTRIL) 10 mg tablet  TAKE 1 TABLET BY MOUTH EVERY DAY  30 Tab     ?  metFORMIN ER (GLUCOPHAGE XR) 500 mg tablet  TAKE 2 TABLETS BY MOUTH DAILY WITH DINNER  60 Tab     ?  acetaminophen (TYLENOL ARTHRITIS PAIN) 650 mg TbER  Take 650 mg by mouth every eight (8) hours.       ?  TRUE METRIX GLUCOSE METER misc  USE TO CHECK BLOOD SUGAR 1 TO 3 TIMES A DAY AS DIRECTED       ?  Blood-Glucose Meter monitoring kit  Check blood sugar 1-3 times daily as directed by physician E 11.9  1 Kit     ?  glucose blood VI test strips (BLOOD GLUCOSE TEST) strip  Check blood sugar 1-3 times daily as directed by physician E 11.9  100 Strip     ?  insulin glargine (LANTUS,BASAGLAR) 100 unit/mL (3 mL) inpn  10 U qhs. Increase by 2 U every 2 Days until FBG <120, call office to report. E 11.9  5 Adjustable Dose Pre-filled Pen Syringe     ?  Insulin Needles, Disposable, 31 gauge x 5/16" ndle  Use with Lantus Solostar pen  1 Package     ?  lancets misc  Blood sugar 1-3 times daily as directed by physician E 11.9  1 Each     ?  atorvastatin (LIPITOR) 40 mg tablet  Take 1 Tab by mouth daily.  90 Tab     ?  aspirin-acetaminophen-caffeine (EXCEDRIN ES) 250-250-65 mg per tablet  Take 1 Tab by mouth every six (6) hours as needed.       ?  ibuprofen (MOTRIN) 600 mg tablet  every six (6) hours as needed.       ?  inhalational spacing device  1 Each by Does Not Apply route as needed.  1 Device     ?  albuterol (PROVENTIL HFA, VENTOLIN HFA, PROAIR HFA) 90 mcg/actuation inhaler  Take 2 Puffs by inhalation every four (4) hours as needed for Wheezing.  1 Inhaler          ?  valACYclovir (VALTREX) 1 gram tablet  Take 1 Tab by mouth two (2) times a day.  28 Tab          No current facility-administered medications for this visit.            Family History of CAD:    No      Social History:  Current  Smoker  No      Physical Exam:   Visit Vitals      BP  132/78 (BP 1 Location: Left arm, BP Patient Position:  Sitting)     Pulse  100     Resp  18     Ht  '5\' 3"'$  (1.6 m)     Wt  260 lb 12.8 oz (118.3 kg)         LMP  03/08/2016  Comment: has heavy periods per patient        SpO2  97%        BMI  46.20 kg/m??               Gen: Well-developed, well-nourished, in no acute distress   Neck: Supple,No JVD, No Carotid Bruit,    Resp: No accessory muscle use, Clear breath sounds, No rales or rhonchi   Card: Regular Rate,Rythm,Normal S1, S2, No murmurs, rubs or gallop. No thrills.    Abd:  Soft, non-tender, non-distended,BS+,    MSK: No cyanosis   Skin: No rashes     Neuro: moving all four extremities , follows commands appropriately   Psych:  Good insight, oriented to person, place , alert, Nml Affect   LE: No edema      EKG:          LABS:              Lab Results         Component  Value  Date/Time            WBC  8.4  11/28/2017 07:50 PM       HGB  12.1  11/28/2017 07:50 PM       HCT  36.8  11/28/2017 07:50 PM            PLATELET  341  11/28/2017 07:50 PM          Lab Results         Component  Value  Date/Time            Sodium  134 (L)  11/28/2017 07:50 PM       Potassium  4.1  11/28/2017 07:50 PM       Chloride  100  11/28/2017 07:50 PM       CO2  23  11/28/2017 07:50 PM       Anion gap  11  11/28/2017 07:50 PM  Glucose  314 (H)  11/28/2017 07:50 PM       BUN  15  11/28/2017 07:50 PM       Creatinine  0.93  11/28/2017 07:50 PM       BUN/Creatinine ratio  16  11/28/2017 07:50 PM       GFR est AA  >60  11/28/2017 07:50 PM       GFR est non-AA  >60  11/28/2017 07:50 PM            Calcium  9.1  11/28/2017 07:50 PM             Lab Results         Component  Value  Date/Time            aPTT  30.7  11/02/2016 09:28 AM          Lab Results         Component  Value  Date/Time            INR  0.9  11/02/2016 09:28 AM            Prothrombin time  9.7  11/02/2016 09:28 AM        No components found for: LAST LIPIDS      ATTENTION:    This medical record was transcribed using an electronic medical records/speech recognition system.   Although proofread, it may and can contain electronic, spelling and other errors.  Corrections may be executed at a later time.  Please feel free to  contact us for any clarifications as needed.         Margette Fast, MD

## 2018-01-23 ENCOUNTER — Encounter

## 2018-01-23 ENCOUNTER — Institutional Professional Consult (permissible substitution): Primary: Family Medicine

## 2018-01-23 NOTE — Progress Notes (Signed)
MCT loop mailed per Dr Prabhakar dx: palps  Chargeable visit.

## 2018-01-25 ENCOUNTER — Inpatient Hospital Stay: Admit: 2018-01-25 | Payer: PRIVATE HEALTH INSURANCE | Attending: Internal Medicine | Primary: Family Medicine

## 2018-01-25 DIAGNOSIS — I10 Essential (primary) hypertension: Secondary | ICD-10-CM

## 2018-01-25 LAB — TRANSTHORACIC ECHOCARDIOGRAM (TTE) COMPLETE (CONTRAST/BUBBLE/3D PRN)
AV Area by Peak Velocity: 2 cm2
AV Peak Gradient: 7.3 mmHg
AV Peak Velocity: 135.14 cm/s
Aortic Root: 3.72 cm
Est. RA Pressure: 3 mmHg
IVSd: 0.8 cm (ref 0.6–0.9)
LA Major Axis: 3.44 cm
LA Volume 2C: 48.1 mL (ref 22–52)
LA Volume 4C: 82.34 mL — AB (ref 22–52)
LA Volume BP: 77.53 mL (ref 22–52)
LA Volume Index 2C: 22.25 ml/m2 (ref 16–28)
LA Volume Index 4C: 38.09 ml/m2 (ref 16–28)
LA Volume Index BP: 35.86 ml/m2 (ref 16–28)
LA/AO Root Ratio: 0.92
LV Mass 2D Index: 56.1 g/m2 (ref 43–95)
LV Mass 2D: 121.2 g (ref 67–162)
LVIDd: 4.83 cm (ref 3.9–5.3)
LVIDs: 3.18 cm
LVOT Diameter: 1.93 cm
LVOT Peak Gradient: 3.5 mmHg
LVOT Peak Velocity: 93.62 cm/s
LVPWd: 0.6 cm (ref 0.6–0.9)
Left Ventricular Ejection Fraction: 58
MR Peak Gradient: 27.5 mmHg
MR Peak Velocity: 262.26 cm/s
MV A Velocity: 78.78 cm/s
MV E Velocity: 103.71 cm/s
MV E Wave Deceleration Time: 202.2 ms
MV E/A: 1.32
PASP: 28.6 mmHg
PV Max Velocity: 67.15 cm/s
PV Peak Gradient: 1.8 mmHg
RVIDd: 3.72 cm
RVSP: 28.6 mmHg
TR Max Velocity: 252.84 cm/s
TR Peak Gradient: 25.6 mmHg

## 2018-01-25 LAB — ECHO ADULT COMPLETE
AV Area by Peak Velocity: 2 cm2
AV Peak Gradient: 7.3 mmHg
AV Peak Velocity: 135.14 cm/s
Aortic Root: 3.72 cm
Est. RA Pressure: 3 mmHg
IVSd: 0.8 cm (ref 0.6–0.9)
LA Major Axis: 3.44 cm
LA Volume 2C: 48.1 mL (ref 22–52)
LA Volume 4C: 82.34 mL — AB (ref 22–52)
LA Volume BP: 77.53 mL (ref 22–52)
LA Volume Index 2C: 22.25 ml/m2 (ref 16–28)
LA Volume Index 4C: 38.09 ml/m2 (ref 16–28)
LA Volume Index BP: 35.86 ml/m2 (ref 16–28)
LA/AO Root Ratio: 0.92
LV Mass 2D Index: 56.1 g/m2 (ref 43–95)
LV Mass 2D: 121.2 g (ref 67–162)
LVIDd: 4.83 cm (ref 3.9–5.3)
LVIDs: 3.18 cm
LVOT Diameter: 1.93 cm
LVOT Peak Gradient: 3.5 mmHg
LVOT Peak Velocity: 93.62 cm/s
LVPWd: 0.6 cm (ref 0.6–0.9)
MR Peak Gradient: 27.5 mmHg
MR Peak Velocity: 262.26 cm/s
MV A Velocity: 78.78 cm/s
MV E Velocity: 103.71 cm/s
MV E Wave Deceleration Time: 202.2 ms
MV E/A: 1.32
PASP: 28.6 mmHg
PV Max Velocity: 67.15 cm/s
PV Peak Gradient: 1.8 mmHg
RVIDd: 3.72 cm
RVSP: 28.6 mmHg
TR Max Velocity: 252.84 cm/s
TR Peak Gradient: 25.6 mmHg

## 2018-01-25 MED ORDER — REGADENOSON 0.4 MG/5 ML IV SYRINGE
0.4 mg/5 mL | Freq: Once | INTRAVENOUS | Status: AC
Start: 2018-01-25 — End: 2018-01-25
  Administered 2018-01-25: 15:00:00 via INTRAVENOUS

## 2018-01-25 MED ORDER — TECHNETIUM TC 99M SESTAMIBI - CARDIOLITE
Freq: Once | Status: AC
Start: 2018-01-25 — End: 2018-01-25
  Administered 2018-01-25: 15:00:00 via INTRAVENOUS

## 2018-01-25 MED ORDER — PERFLUTREN LIPID MICROSPHERES 1.1 MG/ML IV
1.1 mg/mL | Freq: Once | INTRAVENOUS | Status: AC
Start: 2018-01-25 — End: 2018-01-25
  Administered 2018-01-25: 14:00:00 via INTRAVENOUS

## 2018-01-25 MED FILL — LEXISCAN 0.4 MG/5 ML INTRAVENOUS SYRINGE: 0.4 mg/5 mL | INTRAVENOUS | Qty: 5

## 2018-01-25 MED FILL — DEFINITY 1.1 MG/ML INTRAVENOUS SUSPENSION: 1.1 mg/mL | INTRAVENOUS | Qty: 2

## 2018-01-25 NOTE — Telephone Encounter (Signed)
Patient needs a E Cardio Event Monitor from [facility] 01/19/18 for Palpitations.

## 2018-01-25 NOTE — Progress Notes (Signed)
Called patient no answer, LM/Vm to call office in regards to below results.    Nml stress nuc study/nml pump function-normal EF

## 2018-01-25 NOTE — Progress Notes (Signed)
Called patient ID verified X2 reviewed below results per Dr Kandice Moos.    Nml stress nuc study/nml pump function-normal EF    Patient verbalized understanding.

## 2018-01-25 NOTE — Progress Notes (Signed)
Called, left vm for pt to return call to office.     Nml stress nuc study/nml pump function-normal EF

## 2018-01-25 NOTE — Progress Notes (Signed)
Called patient ID verified X2 reviewed below results per Dr Donnella Bi.    Nml stress nuc study/nml pump function-normal EF    Patient verbalized understanding.

## 2018-01-26 ENCOUNTER — Inpatient Hospital Stay: Admit: 2018-01-26 | Payer: PRIVATE HEALTH INSURANCE | Attending: Internal Medicine | Primary: Family Medicine

## 2018-01-26 LAB — NM STRESS TEST WITH MYOCARDIAL PERFUSION
Baseline Diastolic BP: 99 mmHg
Baseline HR: 80 {beats}/min
Baseline Systolic BP: 138 mmHg
Left Ventricular Ejection Fraction: 59
Stress Diastolic BP: 92 mmHg
Stress Estimated Workload: 1 METS
Stress Peak HR: 127 {beats}/min
Stress Percent HR Achieved: 70 %
Stress Rate Pressure Product: 19050 BPM*mmHg
Stress ST Depression: 0 mm
Stress ST Elevation: 0 mm
Stress Systolic BP: 150 mmHg
Stress Target HR: 182 {beats}/min

## 2018-01-26 LAB — NUCLEAR CARDIAC STRESS TEST
Baseline Diastolic BP: 99 mmHg
Baseline HR: 80 {beats}/min
Baseline Systolic BP: 138 mmHg
Stress Diastolic BP: 92 mmHg
Stress Estimated Workload: 1 METS
Stress Peak HR: 127 {beats}/min
Stress Percent HR Achieved: 70 %
Stress Rate Pressure Product: 19050 BPM*mmHg
Stress ST Depression: 0 mm
Stress ST Elevation: 0 mm
Stress Systolic BP: 150 mmHg
Stress Target HR: 182 {beats}/min

## 2018-01-26 MED ORDER — TECHNETIUM TC 99M SESTAMIBI - CARDIOLITE
Freq: Once | Status: AC
Start: 2018-01-26 — End: 2018-01-26
  Administered 2018-01-26: 14:00:00 via INTRAVENOUS

## 2018-01-26 NOTE — Progress Notes (Signed)
CCM Outreach    Goals     ??? Attends follow-up appointments as directed.      Pt will call OBGYN for ER f/u appt within 3 days for f/u on wound. Cardio f/u - 01/19/18, PCP - TBD    01/10/18 pt did not f/u with provider as instructed, "busy working." advised pt to f/u ASAP with PCP for wound recheck and to f/u on diabetes, agreed     01/17/18 Pt reported she has scheduled f/u wiuth PCP and GYN, but does not know the date. Unable to provide date since she is driving. Scheduled to f/u with cardio 01/19/18 to address holter monitor and the need to be evaluated for OSA; GYN - 02/01/18 and [place] of abdomen. According to CC, pt has rescheduled appt's multiple times.     01/26/18 call placed to pt, no answer. VM left to return call       ??? Care Coodination (pt-stated)      Pt will f/u with provider in 3 days  Pt will f/u with PCP to address gaps in care and check Lipid    01/10/18 PCP f/u - TBD,  cardio 01/16/18, GYN - 02/01/18          ??? Knowledge and adherence to medication plan.     ??? Patient verbalizes understanding of self -management goals of living with Diabetes.      Pt will monitor BS at least BID, take diabetic medication as prescribed and work with dietician to assist with wt loss and assist with lowering A1C.    01/10/18 Pt is not monitoring BS's as advised. Pt didn't check this AM, but did check last night. BS 130. Pt reported she is now taking 75 units of Lantus, up from 70 units. Advised pt to f/u withy PCP and to request PCP fax order over order to Diabetic treatment [facility]. Pt may also benefit from seeing Endo. Advised pt to discuss with PCP.      01/17/18 pt continues to not check BS as advised. Pt has not checked BS today. Last BS was yesterday AM - 130. Pt verbalizes that she understands the importance, but states she is too busy to check or falls asleep before checking.      ??? Understands red flags post discharge.      Increased pain, swelling, warmth, or redness.  Red streaks leading from the infected skin.   Pus draining from the wound.  A fever.    01/10/18 pt denies increased pain, swelling, warmth, or redness, red streaks leading from the infected skin, pus draining from the wound or fever. Pt reported wound has closed.    01/17/18 pt denies further issues with abscess's or wounds. Denies temp. Pt denies CP or SOB at this time. Pt does report "fluid in my abdomen," scheduled for [place]. Reported last wt wass 255 lb

## 2018-01-26 NOTE — Progress Notes (Signed)
 CCM Outreach    Goals     . Attends follow-up appointments as directed.      Pt will call OBGYN for ER f/u appt within 3 days for f/u on wound. Cardio f/u - 01/19/18, PCP - TBD    01/10/18 pt did not f/u with provider as instructed, busy working. advised pt to f/u ASAP with PCP for wound recheck and to f/u on diabetes, agreed     01/17/18 Pt reported she has scheduled f/u wiuth PCP and GYN, but does not know the date. Unable to provide date since she is driving. Scheduled to f/u with cardio 01/19/18 to address holter monitor and the need to be evaluated for OSA; GYN - 02/01/18 and US  of abdomen. According to CC, pt has rescheduled appt's multiple times.     01/26/18 call placed to pt, no answer. VM left to return call       . Care Coodination (pt-stated)      Pt will f/u with provider in 3 days  Pt will f/u with PCP to address gaps in care and check Lipid    01/10/18 PCP f/u - TBD,  cardio 01/16/18, GYN - 02/01/18          . Knowledge and adherence to medication plan.     . Patient verbalizes understanding of self -management goals of living with Diabetes.      Pt will monitor BS at least BID, take diabetic medication as prescribed and work with dietician to assist with wt loss and assist with lowering A1C.    01/10/18 Pt is not monitoring BS's as advised. Pt didn't check this AM, but did check last night. BS 130. Pt reported she is now taking 75 units of Lantus , up from 70 units. Advised pt to f/u withy PCP and to request PCP fax order over order to Diabetic treatment [facility]. Pt may also benefit from seeing Endo. Advised pt to discuss with PCP.      01/17/18 pt continues to not check BS as advised. Pt has not checked BS today. Last BS was yesterday AM - 130. Pt verbalizes that she understands the importance, but states she is too busy to check or falls asleep before checking.      . Understands red flags post discharge.      Increased pain, swelling, warmth, or redness.  Red streaks leading from the infected skin.  Pus  draining from the wound.  A fever.    01/10/18 pt denies increased pain, swelling, warmth, or redness, red streaks leading from the infected skin, pus draining from the wound or fever. Pt reported wound has closed.    01/17/18 pt denies further issues with abscess's or wounds. Denies temp. Pt denies CP or SOB at this time. Pt does report fluid in my abdomen, scheduled for US . Reported last wt wass 255 lb

## 2018-01-29 MED ORDER — VALACYCLOVIR 1 G TAB
1 gram | ORAL_TABLET | ORAL | 0 refills | Status: DC
Start: 2018-01-29 — End: 2018-03-10

## 2018-02-01 ENCOUNTER — Encounter

## 2018-02-01 ENCOUNTER — Ambulatory Visit: Attending: Obstetrics & Gynecology | Primary: Family Medicine

## 2018-02-01 ENCOUNTER — Encounter: Admit: 2018-02-01 | Primary: Family Medicine

## 2018-02-01 ENCOUNTER — Ambulatory Visit
Admit: 2018-02-01 | Discharge: 2018-02-01 | Payer: PRIVATE HEALTH INSURANCE | Attending: Obstetrics & Gynecology | Primary: Family Medicine

## 2018-02-01 ENCOUNTER — Encounter: Admit: 2018-02-01 | Discharge: 2018-02-01 | Payer: PRIVATE HEALTH INSURANCE | Primary: Family Medicine

## 2018-02-01 DIAGNOSIS — R102 Pelvic and perineal pain: Secondary | ICD-10-CM

## 2018-02-01 NOTE — Progress Notes (Addendum)
Ultrasound followup    Alyssa [name] is a 37 y.o. female is here today to review the results of her ultrasound evaluation.  Her U/S evaluation is performed because of a previous encounter revealing RUQ which was identified 6 weeks ago.   She is here for an initial ultrasound study.  The sonogram: UTERUS IS PARTIALLY ABSENT.  CERVIX APPEARS WNL.  RIGHT OVARY IS SURGICALLY ABSENT.  RIGHT ADNEXA APPEARS FLUID FILLED.  LEFT OVARY APPEARS TO HAVE A FOLLICULAR CYST.  LEFT ADNEXA APPEARS FLUID FILLED.  COPIUS FREE FLUID IS SEEN IN THE PELVIS.Marland Kitchen    See detailed report for more information.   Findings unchanged since last [place].  Ovary is smaller and not suspicious for malignancy.  No gyn source of ascites is seen.  Suggest she see Dr. Dema Severin for further evaluation.  I spent 15 minutes with the patient, more than half of which was face to face counseling.

## 2018-02-01 NOTE — Progress Notes (Signed)
Ultrasound followup    Alyssa [name] is a 37 y.o. female is here today to review the results of her ultrasound evaluation.  Her U/S evaluation is performed because of a previous encounter revealing RUQ which was identified 6 weeks ago.   She is here for an initial ultrasound study.  The sonogram: UTERUS IS PARTIALLY ABSENT.  CERVIX APPEARS WNL.  RIGHT OVARY IS SURGICALLY ABSENT.  RIGHT ADNEXA APPEARS FLUID FILLED.  LEFT OVARY APPEARS TO HAVE A FOLLICULAR CYST.  LEFT ADNEXA APPEARS FLUID FILLED.  COPIUS FREE FLUID IS SEEN IN THE PELVIS.Marland Kitchen    See detailed report for more information.   Findings unchanged since last [place].  Ovary is smaller and not suspicious for malignancy.  No gyn source of ascites is seen.  Suggest she see Dr. Dema Severin for further evaluation.  I spent 15 minutes with the patient, more than half of which was face to face counseling.

## 2018-02-12 MED ORDER — INSULIN GLARGINE 100 UNIT/ML (3 ML) SUB-Q PEN
100 unit/mL (3 mL) | SUBCUTANEOUS | 0 refills | Status: DC
Start: 2018-02-12 — End: 2018-02-26

## 2018-02-12 NOTE — Telephone Encounter (Signed)
Called patient ID verified X2 reviewed below results per Dr Kandice Moos.    Nml stress nuc study/nml pump function-normal EF    Patient verbalized understanding.

## 2018-02-12 NOTE — Telephone Encounter (Signed)
Please call Alyssa [name] at [phone].  She states she's returning your call.     Thank you, [name]

## 2018-02-12 NOTE — Progress Notes (Signed)
CCM Outreach    Call placed to pt. Pt stated that she is at work and that she did not have time to speak with CM, waiting on a return call. Requested that pt call CM back when time permits. [facility] letter sent to pt addressing uncontrolled diabetes.     Goals     ??? Attends follow-up appointments as directed.      Pt will call OBGYN for ER f/u appt within 3 days for f/u on wound. Cardio f/u - 01/19/18, PCP - TBD    01/10/18 pt did not f/u with provider as instructed, "busy working." advised pt to f/u ASAP with PCP for wound recheck and to f/u on diabetes, agreed     01/17/18 Pt reported she has scheduled f/u wiuth PCP and GYN, but does not know the date. Unable to provide date since she is driving. Scheduled to f/u with cardio 01/19/18 to address holter monitor and the need to be evaluated for OSA; GYN - 02/01/18 and [place] of abdomen. According to CC, pt has rescheduled appt's multiple times.     01/26/18 call placed to pt, no answer. VM left to return call 02/01/18.     02/12/18 Pt attenbded appt with GYN F/U TBD, Cardio F/U 03/01/18. PCP F/U - TBD. Need to address uncontrolled diabetes.        ??? Care Coodination (pt-stated)      Pt will f/u with provider in 3 days  Pt will f/u with PCP to address gaps in care and check Lipid    01/10/18 PCP f/u - TBD,  cardio 01/16/18, GYN - 02/01/18          ??? Knowledge and adherence to medication plan.     ??? Patient verbalizes understanding of self -management goals of living with Diabetes.      Pt will monitor BS at least BID, take diabetic medication as prescribed and work with dietician to assist with wt loss and assist with lowering A1C.    01/10/18 Pt is not monitoring BS's as advised. Pt didn't check this AM, but did check last night. BS 130. Pt reported she is now taking 75 units of Lantus, up from 70 units. Advised pt to f/u withy PCP and to request PCP fax order over order to Diabetic treatment [facility]. Pt may also benefit from seeing Endo. Advised pt to discuss with PCP.       01/17/18 pt continues to not check BS as advised. Pt has not checked BS today. Last BS was yesterday AM - 130. Pt verbalizes that she understands the importance, but states she is too busy to check or falls asleep before checking.    02/12/18 call placed to pt. Pt was not able to speak with CM at this time. Requested a return call when time permits. Pt has not made a f/u appt with PCP to address diabetes.    Lab Results   Component Value Date/Time    Hemoglobin A1c 12.2 (H) 08/30/2017 11:24 AM           ??? Understands red flags post discharge.      Increased pain, swelling, warmth, or redness.  Red streaks leading from the infected skin.  Pus draining from the wound.  A fever.    01/10/18 pt denies increased pain, swelling, warmth, or redness, red streaks leading from the infected skin, pus draining from the wound or fever. Pt reported wound has closed.    01/17/18 pt denies further issues with abscess's or  wounds. Denies temp. Pt denies CP or SOB at this time. Pt does report "fluid in my abdomen," scheduled for [place]. Reported last wt wass 255 lb            CM will f/u in 3 weeks

## 2018-02-12 NOTE — Progress Notes (Signed)
 CCM Outreach    Call placed to Alyssa [name]. Alyssa [name] stated that she is at work and that she did not have time to speak with CM, waiting on a return call. Requested that Alyssa [name] call CM back when time permits. [facility] letter sent to Alyssa [name] addressing uncontrolled diabetes.     Goals     . Attends follow-up appointments as directed.      Alyssa [name] will call OBGYN for ER f/u appt within 3 days for f/u on wound. Cardio f/u - 01/19/18, PCP - TBD    01/10/18 Alyssa [name] did not f/u with provider as instructed, busy working. advised Alyssa [name] to f/u ASAP with PCP for wound recheck and to f/u on diabetes, agreed     01/17/18 Alyssa [name] reported she has scheduled f/u wiuth PCP and GYN, but does not know the date. Unable to provide date since she is driving. Scheduled to f/u with cardio 01/19/18 to address holter monitor and the need to be evaluated for OSA; GYN - 02/01/18 and US  of abdomen. According to CC, Alyssa [name] has rescheduled appt's multiple times.     01/26/18 call placed to Alyssa [name], no answer. VM left to return call 02/01/18.     02/12/18 Alyssa [name] attenbded appt with GYN F/U TBD, Cardio F/U 03/01/18. PCP F/U - TBD. Need to address uncontrolled diabetes.        . Care Coodination (Alyssa [name]-stated)      Alyssa [name] will f/u with provider in 3 days  Alyssa [name] will f/u with PCP to address gaps in care and check Lipid    01/10/18 PCP f/u - TBD,  cardio 01/16/18, GYN - 02/01/18          . Knowledge and adherence to medication plan.     . Patient verbalizes understanding of self -management goals of living with Diabetes.      Alyssa [name] will monitor BS at least BID, take diabetic medication as prescribed and work with dietician to assist with wt loss and assist with lowering A1C.    01/10/18 Alyssa [name] is not monitoring BS's as advised. Alyssa [name] didn't check this AM, but did check last night. BS 130. Alyssa [name] reported she is now taking 75 units of Lantus , up from 70 units. Advised Alyssa [name] to f/u withy PCP and to request PCP fax order over order to Diabetic treatment [facility]. Alyssa [name] may also benefit from seeing Endo. Advised Alyssa [name] to discuss with PCP.       01/17/18 Alyssa [name] continues to not check BS as advised. Alyssa [name] has not checked BS today. Last BS was yesterday AM - 130. Alyssa [name] verbalizes that she understands the importance, but states she is too busy to check or falls asleep before checking.    02/12/18 call placed to Alyssa [name]. Alyssa [name] was not able to speak with CM at this time. Requested a return call when time permits. Alyssa [name] has not made a f/u appt with PCP to address diabetes.    Lab Results   Component Value Date/Time    Hemoglobin A1c 12.2 (H) 08/30/2017 11:24 AM           . Understands red flags post discharge.      Increased pain, swelling, warmth, or redness.  Red streaks leading from the infected skin.  Pus draining from the wound.  A fever.    01/10/18 Alyssa [name] denies increased pain, swelling, warmth, or redness, red streaks leading from the infected skin, pus draining from the wound or fever. Alyssa [name] reported wound has closed.    01/17/18 Alyssa [name] denies further issues with abscess's or  wounds. Denies temp. Alyssa [name] denies CP or SOB at this time. Alyssa [name] does report fluid in my abdomen, scheduled for US . Reported last wt wass 255 lb            CM will f/u in 3 weeks

## 2018-02-15 NOTE — Progress Notes (Signed)
Received serious loop alert      02/14/18   8:39pm   Auto Trigger   Sinus Rhythm, Sinus Bradycardia, Pause (3.0 x2 Seconds) with rates up to 40 bpm    **[employer] Note:  Awaiting Call Back from Pt**      Informed Dr Kandice Moos & Dr Kathyrn Sheriff

## 2018-02-16 NOTE — Telephone Encounter (Signed)
Patient states that she believes she is allergic to the lead pads being used for her event monitor, she a sensitive, red, raised rash. She also stated that she was out of them, I advised her to call the company to get more per [place].     Phone: [phone] or [phone]

## 2018-02-22 NOTE — Progress Notes (Signed)
E cardio event monitor-day 16 of 30.  Sinus bradycardia heart rate 4 40 and 31.  Sinus pause-2-second pauses x3.  Not on beta-blockers.  Continue to wear event monitor.  Has follow-up appointment on 11/40/19.

## 2018-02-22 NOTE — Telephone Encounter (Signed)
Called patient no answer, LM/Vm to call office in regards to below message.      E cardio event monitor-day 16 of 30.  Sinus bradycardia heart rate 4 40 and 31.  Sinus pause-2-second pauses x3.  Not on beta-blockers.  Continue to wear event monitor.  Has follow-up appointment on 11/40/19.

## 2018-02-26 MED ORDER — INSULIN GLARGINE 100 UNIT/ML (3 ML) SUB-Q PEN
100 unit/mL (3 mL) | SUBCUTANEOUS | 1 refills | Status: DC
Start: 2018-02-26 — End: 2018-03-01

## 2018-02-26 NOTE — Telephone Encounter (Signed)
Overdue for a1c, we also need clarity on how the patient is dosing her lantus because I had set her to tritate and we should have heard back about where she landed with dosing. Could we call and ask her to come in for a visit

## 2018-02-26 NOTE — Telephone Encounter (Signed)
[facility] faxed 90 day refill request

## 2018-02-28 ENCOUNTER — Encounter: Attending: Family Medicine | Primary: Family Medicine

## 2018-02-28 NOTE — Telephone Encounter (Signed)
Called and spoke with pt, and she has been scheduled to be seen today.

## 2018-03-01 ENCOUNTER — Ambulatory Visit: Attending: Family Medicine | Primary: Family Medicine

## 2018-03-01 ENCOUNTER — Ambulatory Visit: Attending: Internal Medicine | Primary: Family Medicine

## 2018-03-01 ENCOUNTER — Ambulatory Visit
Admit: 2018-03-01 | Discharge: 2018-03-01 | Payer: PRIVATE HEALTH INSURANCE | Attending: Family Medicine | Primary: Family Medicine

## 2018-03-01 ENCOUNTER — Ambulatory Visit: Admit: 2018-03-01 | Payer: PRIVATE HEALTH INSURANCE | Attending: Internal Medicine | Primary: Family Medicine

## 2018-03-01 ENCOUNTER — Inpatient Hospital Stay: Admit: 2018-03-01 | Payer: MEDICAID | Primary: Family Medicine

## 2018-03-01 DIAGNOSIS — E1140 Type 2 diabetes mellitus with diabetic neuropathy, unspecified: Secondary | ICD-10-CM

## 2018-03-01 DIAGNOSIS — R001 Bradycardia, unspecified: Secondary | ICD-10-CM

## 2018-03-01 MED ORDER — FLASH GLUCOSE SENSOR KIT
PACK | 5 refills | Status: DC
Start: 2018-03-01 — End: 2018-03-26

## 2018-03-01 MED ORDER — FLASH GLUCOSE SCANNING READER
1 refills | Status: DC
Start: 2018-03-01 — End: 2018-03-26

## 2018-03-01 MED ORDER — INSULIN GLARGINE 100 UNIT/ML (3 ML) SUB-Q PEN
100 unit/mL (3 mL) | SUBCUTANEOUS | 1 refills | Status: DC
Start: 2018-03-01 — End: 2018-08-22
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
## 2018-03-01 NOTE — Patient Instructions (Signed)
Denine is here today in follow-up, it is been a long interval since her last labs.  We will start by getting lab work again.  I am pleased to see that she has been titrating up her insulin and she reached 74 units of Lantus nightly.  I believe it would benefit her to get a continuous glucose monitor so that we could watch for peaks or valleys after meals and consider adding mealtime if appropriate  With regards to her obesity her weight is stable.  I encouraged her to follow a healthy diet and exercise with a goal of moderate weight loss to improve cardiovascular risk factors.  I know she was recently advised by her cardiologist on cardiovascular risk modification  She does have a history of iron deficiency anemia, she is due for recheck and of course a CBC is appropriate when getting an A1c to ensure there is no anemia and the accuracy of the A1c test
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
## 2018-03-01 NOTE — Progress Notes (Signed)
Chief Complaint   Patient presents with   ??? Follow-up     Patient presents today for a folow up visit for HTN & palpitations   ??? Hypertension   ??? Palpitations     Patient mailed back her monitor today     Visit Vitals  BP 124/78 (BP 1 Location: Left arm, BP Patient Position: Sitting)   Pulse (!) 101   Resp 18   Ht 5\' 3"  (1.6 m)   Wt 263 lb 12.8 oz (119.7 kg)   SpO2 96%   BMI 46.73 kg/m??         Chest pain denied  SOB - denied  Dizziness denied  Swelling/Edema- denied  Recent hospital visit denied  Refills denied

## 2018-03-01 NOTE — Progress Notes (Signed)
Chief Complaint   Patient presents with   ??? Diabetes     f/u     1. Have you been to the ER, urgent care clinic since your last visit?  Hospitalized since your last visit? Yes 12/30/17 [facility] for abscess    2. Have you seen or consulted any other health care providers outside of the [place] since your last visit?  Include any pap smears or colon screening. Yes eye dr Silvestre Mesi
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
## 2018-03-01 NOTE — Progress Notes (Signed)
 Chief Complaint   Patient presents with   . Diabetes     f/u     1. Have you been to the ER, urgent care clinic since your last visit?  Hospitalized since your last visit? Yes 12/30/17 [facility] for abscess    2. Have you seen or consulted any other health care providers outside of the [facility] since your last visit?  Include any pap smears or colon screening. Yes eye dr DELENA

## 2018-03-01 NOTE — Progress Notes (Signed)
Diabetic control is improved but not sufficiently at goal.  We are going to need to make some changes to management, please keep your follow-up appointment to discuss this with me  A1c is 11.1  Sodium is slightly low because the sugar is so high.  We will keep an eye on this but it does not require any intervention other than controlling diabetes  Platelets are high, this is chronic and stable
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
## 2018-03-01 NOTE — Progress Notes (Signed)
 Chief Complaint   Patient presents with   . Follow-up     Patient presents today for a folow up visit for HTN & palpitations   . Hypertension   . Palpitations     Patient mailed back her monitor today     Visit Vitals  BP 124/78 (BP 1 Location: Left arm, BP Patient Position: Sitting)   Pulse (!) 101   Resp 18   Ht 5' 3 (1.6 m)   Wt 263 lb 12.8 oz (119.7 kg)   SpO2 96%   BMI 46.73 kg/m         Chest pain denied  SOB - denied  Dizziness denied  Swelling/Edema- denied  Recent hospital visit denied  Refills denied

## 2018-03-02 LAB — COMPREHENSIVE METABOLIC PANEL
ALT: 20 U/L (ref 12–78)
AST: 11 U/L — ABNORMAL LOW (ref 15–37)
Albumin/Globulin Ratio: 0.9 — ABNORMAL LOW (ref 1.1–2.2)
Albumin: 3.9 g/dL (ref 3.5–5.0)
Alkaline Phosphatase: 71 U/L (ref 45–117)
Anion Gap: 8 mmol/L (ref 5–15)
BUN/Creatinine Ratio: 10 — ABNORMAL LOW (ref 12–20)
BUN: 7 MG/DL (ref 6–20)
CO2: 24 mmol/L (ref 21–32)
Calcium: 9.5 MG/DL (ref 8.5–10.1)
Chloride: 102 mmol/L (ref 97–108)
Creatinine: 0.69 MG/DL (ref 0.55–1.02)
GFR African American: >60 mL/min/{1.73_m2} (ref >60)
Globulin: 4.2 g/dL — ABNORMAL HIGH (ref 2.0–4.0)
Glucose: 237 mg/dL — ABNORMAL HIGH (ref 65–100)
Potassium: 4 mmol/L (ref 3.5–5.1)
Sodium: 134 mmol/L — ABNORMAL LOW (ref 136–145)
Total Bilirubin: 0.5 MG/DL (ref 0.2–1.0)
Total Protein: 8.1 g/dL (ref 6.4–8.2)
eGFR NON-AA: >60 mL/min/{1.73_m2} (ref >60)

## 2018-03-02 LAB — CBC
Hematocrit: 41.4 % (ref 35.0–47.0)
Hemoglobin: 13.1 g/dL (ref 11.5–16.0)
MCH: 26.4 PG (ref 26.0–34.0)
MCHC: 31.6 g/dL (ref 30.0–36.5)
MCV: 83.5 FL (ref 80.0–99.0)
MPV: 11.9 FL (ref 8.9–12.9)
NRBC Absolute: 0 10*3/uL (ref 0.00–0.01)
Nucleated RBCs: 0 PER 100 WBC
Platelets: 444 10*3/uL — ABNORMAL HIGH (ref 150–400)
RBC: 4.96 M/uL (ref 3.80–5.20)
RDW: 14.2 % (ref 11.5–14.5)
WBC: 10.3 10*3/uL (ref 3.6–11.0)

## 2018-03-02 LAB — HEMOGLOBIN A1C W/EAG
Estimated Avg Glucose: 272 mg/dL
Hemoglobin A1C: 11.1 % — ABNORMAL HIGH (ref 4.0–5.6)

## 2018-03-02 LAB — METABOLIC PANEL, COMPREHENSIVE
A-G Ratio: 0.9 — ABNORMAL LOW (ref 1.1–2.2)
ALT (SGPT): 20 U/L (ref 12–78)
AST (SGOT): 11 U/L — ABNORMAL LOW (ref 15–37)
Albumin: 3.9 g/dL (ref 3.5–5.0)
Alk. phosphatase: 71 U/L (ref 45–117)
Anion gap: 8 mmol/L (ref 5–15)
BUN/Creatinine ratio: 10 — ABNORMAL LOW (ref 12–20)
BUN: 7 MG/DL (ref 6–20)
Bilirubin, total: 0.5 MG/DL (ref 0.2–1.0)
CO2: 24 mmol/L (ref 21–32)
Calcium: 9.5 MG/DL (ref 8.5–10.1)
Chloride: 102 mmol/L (ref 97–108)
Creatinine: 0.69 MG/DL (ref 0.55–1.02)
GFR est AA: >60 mL/min/{1.73_m2} (ref >60)
GFR est non-AA: >60 mL/min/{1.73_m2} (ref >60)
Globulin: 4.2 g/dL — ABNORMAL HIGH (ref 2.0–4.0)
Glucose: 237 mg/dL — ABNORMAL HIGH (ref 65–100)
Potassium: 4 mmol/L (ref 3.5–5.1)
Protein, total: 8.1 g/dL (ref 6.4–8.2)
Sodium: 134 mmol/L — ABNORMAL LOW (ref 136–145)

## 2018-03-02 LAB — CBC W/O DIFF
ABSOLUTE NRBC: 0 10*3/uL (ref 0.00–0.01)
HCT: 41.4 % (ref 35.0–47.0)
HGB: 13.1 g/dL (ref 11.5–16.0)
MCH: 26.4 PG (ref 26.0–34.0)
MCHC: 31.6 g/dL (ref 30.0–36.5)
MCV: 83.5 FL (ref 80.0–99.0)
MPV: 11.9 FL (ref 8.9–12.9)
NRBC: 0 PER 100 WBC
PLATELET: 444 10*3/uL — ABNORMAL HIGH (ref 150–400)
RBC: 4.96 M/uL (ref 3.80–5.20)
RDW: 14.2 % (ref 11.5–14.5)
WBC: 10.3 10*3/uL (ref 3.6–11.0)

## 2018-03-02 LAB — HEMOGLOBIN A1C WITH EAG
Est. average glucose: 272 mg/dL
Hemoglobin A1c: 11.1 % — ABNORMAL HIGH (ref 4.0–5.6)

## 2018-03-02 NOTE — Progress Notes (Signed)
Diabetic control is improved but not sufficiently at goal.  We are going to need to make some changes to management, please keep your follow-up appointment to discuss this with me  A1c is 11.1  Sodium is slightly low because the sugar is so high.  We will keep an eye on this but it does not require any intervention other than controlling diabetes  Platelets are high, this is chronic and stable

## 2018-03-12 MED ORDER — VALACYCLOVIR 1 G TAB
1 gram | ORAL_TABLET | ORAL | 0 refills | Status: DC
Start: 2018-03-12 — End: 2018-03-26

## 2018-03-12 NOTE — Progress Notes (Signed)
Ms. Vu has an appointment on 04/20/2018 with Dr Renea Ee- [facility] at 9:00am.  The patient has been notified of appointment.

## 2018-03-12 NOTE — Progress Notes (Signed)
Ms. Lowder has an appointment on 04/20/2018 with Dr Renea Ee- [facility] at 9:00am.  The patient has been notified of appointment.

## 2018-03-19 ENCOUNTER — Encounter: Attending: Family Medicine | Primary: Family Medicine

## 2018-03-22 MED ORDER — INSULIN GLARGINE 100 UNIT/ML (3 ML) SUB-Q PEN
100 unit/mL (3 mL) | PEN_INJECTOR | SUBCUTANEOUS | 1 refills | Status: DC
Start: 2018-03-22 — End: 2018-03-26

## 2018-03-26 ENCOUNTER — Inpatient Hospital Stay
Admit: 2018-03-26 | Discharge: 2018-03-26 | Disposition: A | Discharge status: Home / Self Care | Payer: PRIVATE HEALTH INSURANCE | Attending: Student in an Organized Health Care Education/Training Program

## 2018-03-26 ENCOUNTER — Emergency Department: Admit: 2018-03-26 | Payer: PRIVATE HEALTH INSURANCE | Primary: Family Medicine

## 2018-03-26 DIAGNOSIS — S90122A Contusion of left lesser toe(s) without damage to nail, initial encounter: Secondary | ICD-10-CM

## 2018-03-26 MED ORDER — IBUPROFEN 600 MG TAB
600 mg | ORAL_TABLET | Freq: Four times a day (QID) | ORAL | 0 refills | Status: DC | PRN
Start: 2018-03-26 — End: 2018-04-23
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
## 2018-03-26 NOTE — ED Notes (Signed)
The patient was discharged home by provider in stable condition. The patient is alert and oriented, in no respiratory distress. The patient's diagnosis, condition and treatment were explained. The patient expressed understanding. No prescriptions given. No work/school note given. A discharge plan has been developed. A case manager was not involved in the process. Aftercare instructions were given.  Pt ambulatory out of the ED.

## 2018-03-26 NOTE — ED Triage Notes (Signed)
Pt rpts injuring her left 4th toe this weekend.  + redness with swelling.

## 2018-03-26 NOTE — ED Notes (Signed)
Pt resting on the stretcher in no distress. All results available for review and awaiting further care mgmt.  Will continue to monitor.

## 2018-03-26 NOTE — ED Notes (Signed)
Pt rpts injuring her left 4th toe this weekend.  + redness with swelling.

## 2018-03-26 NOTE — ED Notes (Signed)
 Pt resting on the stretcher in no distress. All results available for review and awaiting further care mgmt.  Will continue to monitor.
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
## 2018-03-28 NOTE — Progress Notes (Addendum)
CCM Outreach    Call placed to pt, Verified DOB and address for HIPAA security.    Pt was seen at [facility] ER 03/26/18. Diagnosis: Contusion of fourth toe of left foot. Pt reported she is applying ice to foot, taped toes together and has been resting; pain is minimal.     CM attempted to provide alternative resources to pt other than going to the ER. Pt ended call abruptly with CM and was not interested in CM assistance at this time. Pt has been disengaged.    CM will close episode. CM provided contact information for future assistance if needed.

## 2018-03-28 NOTE — Progress Notes (Signed)
CCM Outreach    Call placed to pt, Verified DOB and address for HIPAA security.    Pt was seen at [facility] ER 03/26/18. Diagnosis: Contusion of fourth toe of left foot. Pt reported she is applying ice to foot, taped toes together and has been resting; pain is minimal.     CM attempted to provide alternative resources to pt other than going to the ER. Pt ended call abruptly with CM and was not interested in CM assistance at this time. Pt has been disengaged.    CM will close episode. CM provided contact information for future assistance if needed.

## 2018-04-23 ENCOUNTER — Emergency Department: Admit: 2018-04-24 | Payer: PRIVATE HEALTH INSURANCE | Primary: Family Medicine

## 2018-04-23 DIAGNOSIS — M545 Low back pain: Secondary | ICD-10-CM

## 2018-04-23 NOTE — ED Notes (Signed)
Assumed care of pt in [place] [facility].

## 2018-04-23 NOTE — ED Triage Notes (Signed)
Patient reports lifting a computer on wheels at work today around 6:30 p.m. and injuring middle back

## 2018-04-23 NOTE — ED Notes (Signed)
Assumed care of pt in [facility].

## 2018-04-23 NOTE — ED Notes (Signed)
The patient was discharged home by provider in stable condition. The patient is alert and oriented, in no respiratory distress and discharge vital signs obtained. The patient's diagnosis, condition and treatment were explained. The patient expressed understanding. Three prescriptions given. No work/school note given. A discharge plan has been developed. A case manager was not involved in the process. Aftercare instructions were given.  Pt ambulatory out of the ED.
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
## 2018-04-23 NOTE — ED Notes (Signed)
Assumed care of pt in [place] [facility].

## 2018-04-23 NOTE — ED Notes (Signed)
Patient reports lifting a computer on wheels at work today around 6:30 p.m. and injuring middle back

## 2018-04-24 ENCOUNTER — Inpatient Hospital Stay
Admit: 2018-04-24 | Discharge: 2018-04-24 | Disposition: A | Discharge status: Home / Self Care | Payer: PRIVATE HEALTH INSURANCE | Attending: Emergency Medicine

## 2018-04-24 MED ORDER — LIDOCAINE 5 % (700 MG/PATCH) ADHESIVE PATCH
5 % | CUTANEOUS | 0 refills | Status: DC
Start: 2018-04-24 — End: 2018-10-24

## 2018-04-24 MED ORDER — KETOROLAC TROMETHAMINE 60 MG/2 ML IM
60 mg/2 mL | INTRAMUSCULAR | Status: AC
Start: 2018-04-24 — End: 2018-04-23
  Administered 2018-04-24: 02:00:00 via INTRAMUSCULAR

## 2018-04-24 MED ORDER — CYCLOBENZAPRINE 10 MG TAB
10 mg | ORAL_TABLET | Freq: Three times a day (TID) | ORAL | 0 refills | Status: DC | PRN
Start: 2018-04-24 — End: 2019-07-10

## 2018-04-24 MED ORDER — LIDOCAINE 4 % TOPICAL PATCH (12 HOUR DURATION)
4 % | CUTANEOUS | Status: DC
Start: 2018-04-24 — End: 2018-04-24

## 2018-04-24 MED ORDER — DICLOFENAC 75 MG TAB, DELAYED RELEASE
75 mg | ORAL_TABLET | Freq: Two times a day (BID) | ORAL | 0 refills | Status: DC
Start: 2018-04-24 — End: 2018-10-24

## 2018-04-24 MED FILL — ASPERCREME (LIDOCAINE) 4 % TOPICAL PATCH: 4 % | CUTANEOUS | Qty: 1

## 2018-04-24 MED FILL — KETOROLAC TROMETHAMINE 60 MG/2 ML IM: 60 mg/2 mL | INTRAMUSCULAR | Qty: 2

## 2018-04-26 ENCOUNTER — Encounter: Attending: Family Medicine | Primary: Family Medicine

## 2018-05-15 NOTE — Progress Notes (Signed)
Patient on report as eligible for Case Management.  Left discreet message on voicemail with this CM contact information.  Will attempt to contact again to offer [facility] [employer] Care [employer] services.

## 2018-05-15 NOTE — Progress Notes (Signed)
Patient on report as eligible for Case Management.  Left discreet message on voicemail with this CM contact information.  Will attempt to contact again to offer [facility] [employer] Care Management services.

## 2018-05-17 NOTE — Progress Notes (Signed)
Verified DOB and address for HIPAA security.  Introduced [employer] for patient.   Patient does not identify any Care Management needs at this time and declines services.  Patient asked for assistance with finding dentists who are in network with [employer].  Located two groups and provided phone numbers to their offices.  Patient was appreciative.  Did not feel that she had a need for services through [facility].

## 2018-05-21 ENCOUNTER — Ambulatory Visit: Attending: Internal Medicine | Primary: Family Medicine

## 2018-05-21 ENCOUNTER — Ambulatory Visit
Admit: 2018-05-21 | Discharge: 2018-05-21 | Payer: PRIVATE HEALTH INSURANCE | Attending: Internal Medicine | Primary: Family Medicine

## 2018-05-21 DIAGNOSIS — R001 Bradycardia, unspecified: Secondary | ICD-10-CM

## 2018-05-21 NOTE — Progress Notes (Signed)
Margette Fast, MD    [address] [place] [address]    Office [phone]  Pager [phone]    Alyssa [name] is a 38 y.o. female is here for f/u visit.    Primary care physician:  Madelin Rear, MD    Patient Active Problem List   Diagnosis Code   ??? Essential hypertension I10   ??? Hyperlipidemia E78.5   ??? Obesity, morbid ([place]) E66.01   ??? SUI (stress urinary incontinence, female) N39.3   ??? Pelvic pain R10.2   ??? Type 2 diabetes mellitus with complication, without long-term current use of insulin (HCC) E11.8   ??? Incisional hernia, without obstruction or gangrene K43.2   ??? Abdominal wall seroma S30.1XXA   ??? Pelvic ascites R18.8   ??? Post-operative pain G89.18   ??? Nausea and vomiting R11.2   ??? Type 2 diabetes mellitus with diabetic neuropathy [facility]) E11.40       Dear Dr Aleene Davidson,  ??  I had the pleasure of seeing Alyssa [name] in the office today.  ??  ??  Assessment:  ??    Less than 3-second sinus pause, history of sinus bradycardia on Holter/event monitor  DM   Hypertension/ hyperlipidemia  Morbidly obese  History of GERD  Clinically has OSA-  ??24 hrs Holter monitor showed - 3 sec sinuspause; Rare PVC/PAC  Event monitor 01/2018 - Sinus brady/ 2 sec pauses times 2  ??  Plan:   ??    Blood pressure controlled  Patient already understands that she needs to lose weight.  Sleep study referral-we have referred her previously but because of insurance reasons she has not been able to get it.  She wants another referral to be done.  Aggressive cardiovascular risk factor modification.  Follow-up in 6 months or earlier as needed  ??  Patient understands the plan. All questions were answered to the patient's satisfaction.  ??  Medication Side Effects and Warnings were discussed with patient: yes  Patient Labs were reviewed and or requested:  yes  Patient Past Records were reviewed and or requested: yes  ??   I appreciate the opportunity to be involved in [facility]. Please see note below for details. Please do not hesitate to contact us with questions or concerns.  ??  Margette Fast, MD  Cardiac Testing/ Procedures:    A.Cardiac Cath/PCI:    B.ECHO/TEE: 01/25/18 - ECHO-EF 56-60%    C.StressNuclear/Stress ECHO/Stress test: 01/26/18 Lexi nuc - Nml/EF 59%    D.Vascular:    E. JO:ACZYSA monitor report 12/29/2017 minimum heart rate 72 bpm maximum heart rate 163 bpm.  Ventricular ectopic-2, supraventricular ectopics-1.  One sinus pause-3 seconds at 2306 hrs..  No diary entries.    E cardio event monitor-day 16 of 30.  Sinus bradycardia heart rate of 40 and 31.  Sinus pause-2-second pauses x3.  Not on beta-blockers    F. Miscellaneous:    Subjective:  Alyssa [name] is a 38 y.o. female who returns for follow up visit.        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  Climbing a flight of stairs causes dyspnea.  No rest pain.  Chest tightness is moderate in intensity.  Relieved spontaneously.  No swelling lower extremities.  June 2019 and had an episode where she had palpitations  [facility]  Work-up was negative but she was advised to follow-up with cardiology.  Has not had any recurrence of palpitations.  No dizziness/syncope.  He is also scheduled to undergo endoscopic procedure under general anesthesia and is here for a preop evaluation.  No prior history of CAD.)    ROS:  (bold if positive, if negative)           Medications before admission:    Current Outpatient Medications   Medication Sig Dispense   ??? diclofenac EC (VOLTAREN) 75 mg EC tablet Take 1 Tab by mouth two (2) times a day. 30 Tab   ??? cyclobenzaprine (FLEXERIL) 10 mg tablet Take 1 Tab by mouth three (3) times daily as needed for Muscle Spasm(s). 20 Tab    ??? lidocaine (LIDODERM) 5 % Apply patch to the affected area for 12 hours a day and remove for 12 hours a day. 15 Each   ??? insulin glargine (LANTUS SOLOSTAR U-100 INSULIN) 100 unit/mL (3 mL) inpn 74 Units Every Bedtime  Indications: type 2 diabetes mellitus 5 Adjustable Dose Pre-filled Pen Syringe   ??? lisinopril (PRINIVIL, ZESTRIL) 10 mg tablet TAKE 1 TABLET BY MOUTH EVERY DAY 30 Tab   ??? metFORMIN ER (GLUCOPHAGE XR) 500 mg tablet TAKE 2 TABLETS BY MOUTH DAILY WITH DINNER 60 Tab   ??? acetaminophen (TYLENOL ARTHRITIS PAIN) 650 mg TbER Take 650 mg by mouth every eight (8) hours.    ??? TRUE METRIX GLUCOSE METER misc USE TO CHECK BLOOD SUGAR 1 TO 3 TIMES A DAY AS DIRECTED    ??? Blood-Glucose Meter monitoring kit Check blood sugar 1-3 times daily as directed by physician E 11.9 1 Kit   ??? Insulin Needles, Disposable, 31 gauge x 5/16" ndle Use with Lantus Solostar pen 1 Package   ??? lancets misc Blood sugar 1-3 times daily as directed by physician E 11.9 1 Each   ??? valACYclovir (VALTREX) 1 gram tablet Take 1 Tab by mouth two (2) times a day. 28 Tab     No current facility-administered medications for this visit.        Family History of CAD:    No    Social History:  Current  Smoker  No    Physical Exam:  Visit Vitals  BP 136/78 (BP 1 Location: Left arm, BP Patient Position: Sitting)   Pulse 96   Ht 5' 3"  (1.6 m)   Wt 264 lb (119.7 kg)   LMP 03/08/2016   SpO2 97%   BMI 46.77 kg/m??          Gen: Well-developed, well-nourished, in no acute distress  Neck: Supple,No JVD, No Carotid Bruit,   Resp: No accessory muscle use, Clear breath sounds, No rales or rhonchi  Card: Regular Rate,Rythm,Normal S1, S2, No murmurs, rubs or gallop. No thrills.   Abd:  Soft, non-tender, non-distended,BS+,   MSK: No cyanosis  Skin: No rashes    Neuro: moving all four extremities , follows commands appropriately  Psych:  Good insight, oriented to person, place , alert, Nml Affect  LE: No edema    EKG:       LABS:        Lab Results    Component Value Date/Time    WBC 10.3 03/01/2018 04:32 PM    HGB 13.1 03/01/2018 04:32 PM    HCT 41.4 03/01/2018 04:32 PM    PLATELET 444 (H) 03/01/2018 04:32 PM     Lab Results  Component Value Date/Time    Sodium 134 (L) 03/01/2018 04:32 PM    Potassium 4.0 03/01/2018 04:32 PM    Chloride 102 03/01/2018 04:32 PM    CO2 24 03/01/2018 04:32 PM    Anion gap 8 03/01/2018 04:32 PM    Glucose 237 (H) 03/01/2018 04:32 PM    BUN 7 03/01/2018 04:32 PM    Creatinine 0.69 03/01/2018 04:32 PM    BUN/Creatinine ratio 10 (L) 03/01/2018 04:32 PM    GFR est AA >60 03/01/2018 04:32 PM    GFR est non-AA >60 03/01/2018 04:32 PM    Calcium 9.5 03/01/2018 04:32 PM       Lab Results   Component Value Date/Time    aPTT 30.7 11/02/2016 09:28 AM     Lab Results   Component Value Date/Time    INR 0.9 11/02/2016 09:28 AM    Prothrombin time 9.7 11/02/2016 09:28 AM     No components found for: LAST LIPIDS    ATTENTION:   This medical record was transcribed using an electronic medical records/speech recognition system.  Although proofread, it may and can contain electronic, spelling and other errors.  Corrections may be executed at a later time.  Please feel free to contact us for any clarifications as needed.      Margette Fast, MD

## 2018-05-21 NOTE — Progress Notes (Signed)
Alyssa [name] is a 38 y.o. female    Chief Complaint   Patient presents with   ??? Hypertension   ??? Palpitations   ??? Slow Heart Rate       Chest pain patient states she gets a heavy pressure that goes towards her back     SOB No    Dizziness No    Swelling No    Refills No    Visit Vitals  BP 136/78 (BP 1 Location: Left arm, BP Patient Position: Sitting)   Pulse 96   Ht 5\' 3"  (1.6 m)   Wt 264 lb (119.7 kg)   LMP 03/08/2016   SpO2 97%   BMI 46.77 kg/m??       1. Have you been to the ER, urgent care clinic since your last visit?  Hospitalized since your last visit? ED 03/26/2018 & 04/23/2018    2. Have you seen or consulted any other health care providers outside of the [facility] since your last visit?  Include any pap smears or colon screening.  No

## 2018-05-21 NOTE — Progress Notes (Signed)
 Suhaila Troiano is a 38 y.o. female    Chief Complaint   Patient presents with   . Hypertension   . Palpitations   . Slow Heart Rate       Chest pain patient states she gets a heavy pressure that goes towards her back     SOB No    Dizziness No    Swelling No    Refills No    Visit Vitals  BP 136/78 (BP 1 Location: Left arm, BP Patient Position: Sitting)   Pulse 96   Ht 5' 3 (1.6 m)   Wt 264 lb (119.7 kg)   LMP 03/08/2016   SpO2 97%   BMI 46.77 kg/m       1. Have you been to the ER, urgent care clinic since your last visit?  Hospitalized since your last visit? ED 03/26/2018 & 04/23/2018    2. Have you seen or consulted any other health care providers outside of the [facility] since your last visit?  Include any pap smears or colon screening.  No

## 2018-05-21 NOTE — Progress Notes (Signed)
Progress Notes by Margette Fast, MD at 05/21/18 1500                Author: Margette Fast, MD  Service: --  Author Type: Physician       Filed: 05/21/18 1714  Encounter Date: 05/21/2018  Status: Signed          Editor: Margette Fast, MD (Physician)                       Margette Fast, MD      [address] [place] [address]      Office [phone]   Pager [phone]      Alyssa [name] is a 38 y.o.  female is here for f/u visit.      Primary care physician:   Madelin Rear, MD        Patient Active Problem List        Diagnosis  Code         ?  Essential hypertension  I10     ?  Hyperlipidemia  E78.5     ?  Obesity, morbid ([place])  E66.01     ?  SUI (stress urinary incontinence, female)  N39.3     ?  Pelvic pain  R10.2     ?  Type 2 diabetes mellitus with complication, without long-term current use of insulin (HCC)  E11.8     ?  Incisional hernia, without obstruction or gangrene  K43.2     ?  Abdominal wall seroma  S30.1XXA     ?  Pelvic ascites  R18.8     ?  Post-operative pain  G89.18     ?  Nausea and vomiting  R11.2         ?  Type 2 diabetes mellitus with diabetic neuropathy [facility])  E11.40           Dear Dr Aleene Davidson,   ??   I had the pleasure of seeing Ms Alyssa [name] in the office today.   ??   ??   Assessment:   ??      Less than 3-second sinus pause, history of sinus bradycardia on Holter/event monitor   DM    Hypertension/ hyperlipidemia   Morbidly obese   History of GERD   Clinically has OSA-   ??24 hrs Holter monitor showed - 3 sec sinuspause; Rare PVC/PAC   Event monitor 01/2018 - Sinus brady/ 2 sec pauses times 2   ??   Plan:    ??      Blood pressure controlled   Patient already understands that she needs to lose weight.   Sleep study referral-we have referred her previously but because of insurance reasons she has not been able to get it.  She wants another referral to be done.   Aggressive cardiovascular risk factor  modification.   Follow-up in 6 months or earlier as needed   ??   Patient understands the plan. All questions were answered to the patient's satisfaction.   ??   Medication Side Effects and Warnings were discussed with patient: yes   Patient Labs were reviewed and or requested:  yes   Patient Past Records were reviewed and or requested: yes   ??   I appreciate the opportunity to be involved in [facility]. Please see note below for details. Please do not hesitate to contact us with questions or concerns.   ??  Margette Fast, MD   Cardiac Testing/ Procedures:      A.Cardiac Cath/PCI:      B.ECHO/TEE: 01/25/18 - ECHO-EF 56-60%      C.StressNuclear/Stress ECHO/Stress test: 01/26/18 Lexi nuc - Nml/EF 59%      D.Vascular:      E. TG:PQDIYM monitor report 12/29/2017 minimum heart rate 72 bpm maximum heart rate 163 bpm.   Ventricular ectopic-2, supraventricular ectopics-1.  One sinus pause-3 seconds at 2306 hrs..  No diary entries.      E cardio event monitor-day 16 of 30.   Sinus bradycardia heart rate of 40 and 31.  Sinus pause-2-second pauses x3.   Not on beta-blockers      F. Miscellaneous:      Subjective:   Alyssa [name] is a 38 y.o.  female who returns for follow up visit.            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  Climbing a flight of stairs causes dyspnea.  No rest pain.  Chest tightness  is moderate in intensity.  Relieved spontaneously.  No swelling lower extremities.  June 2019 and had an episode where she had palpitations [facility]  Work-up was negative but she was advised to follow-up with cardiology.   Has not had any recurrence of palpitations.  No dizziness/syncope.  He is also scheduled to undergo endoscopic procedure under general anesthesia and is here for a preop evaluation.  No prior history  of CAD.)      ROS:   (bold if positive,  if negative)                Medications before admission:        Current Outpatient Medications         Medication  Sig  Dispense          ?  diclofenac EC (VOLTAREN) 75 mg EC tablet  Take 1 Tab by mouth two (2) times a day.  30 Tab     ?  cyclobenzaprine (FLEXERIL) 10 mg tablet  Take 1 Tab by mouth three (3) times daily as needed for Muscle Spasm(s).  20 Tab     ?  lidocaine (LIDODERM) 5 %  Apply patch to the affected area for 12 hours a day and remove for 12 hours a day.  15 Each     ?  insulin glargine (LANTUS SOLOSTAR U-100 INSULIN) 100 unit/mL (3 mL) inpn  74 Units Every Bedtime  Indications: type 2 diabetes mellitus  5 Adjustable Dose Pre-filled Pen Syringe     ?  lisinopril (PRINIVIL, ZESTRIL) 10 mg tablet  TAKE 1 TABLET BY MOUTH EVERY DAY  30 Tab     ?  metFORMIN ER (GLUCOPHAGE XR) 500 mg tablet  TAKE 2 TABLETS BY MOUTH DAILY WITH DINNER  60 Tab     ?  acetaminophen (TYLENOL ARTHRITIS PAIN) 650 mg TbER  Take 650 mg by mouth every eight (8) hours.       ?  TRUE METRIX GLUCOSE METER misc  USE TO CHECK BLOOD SUGAR 1 TO 3 TIMES A DAY AS DIRECTED       ?  Blood-Glucose Meter monitoring kit  Check blood sugar 1-3 times daily as directed by physician E 11.9  1 Kit     ?  Insulin Needles, Disposable, 31 gauge x 5/16" ndle  Use with Lantus Solostar pen  1 Package     ?  lancets misc  Blood sugar 1-3 times daily as directed by physician E 11.9  1 Each          ?  valACYclovir (VALTREX) 1 gram tablet  Take 1 Tab by mouth two (2) times a day.  28 Tab          No current facility-administered medications for this visit.            Family History of CAD:    No      Social History:  Current  Smoker  No      Physical Exam:   Visit Vitals      BP  136/78 (BP 1 Location: Left arm, BP Patient Position: Sitting)     Pulse  96     Ht  '5\' 3"'  (1.6 m)     Wt  264 lb (119.7 kg)     LMP  03/08/2016     SpO2  97%        BMI  46.77 kg/m??               Gen: Well-developed, well-nourished, in no  acute distress   Neck: Supple,No JVD, No Carotid Bruit,    Resp: No accessory muscle use, Clear breath sounds, No rales or rhonchi   Card: Regular Rate,Rythm,Normal S1, S2, No murmurs, rubs or gallop. No thrills.    Abd:  Soft, non-tender, non-distended,BS+,    MSK: No cyanosis   Skin: No rashes     Neuro: moving all four extremities , follows commands appropriately   Psych:  Good insight, oriented to person, place , alert, Nml Affect   LE: No edema      EKG:          LABS:              Lab Results         Component  Value  Date/Time            WBC  10.3  03/01/2018 04:32 PM       HGB  13.1  03/01/2018 04:32 PM       HCT  41.4  03/01/2018 04:32 PM            PLATELET  444 (H)  03/01/2018 04:32 PM          Lab Results         Component  Value  Date/Time            Sodium  134 (L)  03/01/2018 04:32 PM       Potassium  4.0  03/01/2018 04:32 PM       Chloride  102  03/01/2018 04:32 PM       CO2  24  03/01/2018 04:32 PM       Anion gap  8  03/01/2018 04:32 PM       Glucose  237 (H)  03/01/2018 04:32 PM       BUN  7  03/01/2018 04:32 PM       Creatinine  0.69  03/01/2018 04:32 PM       BUN/Creatinine ratio  10 (L)  03/01/2018 04:32 PM       GFR est AA  >60  03/01/2018 04:32 PM       GFR est non-AA  >60  03/01/2018 04:32 PM  Calcium  9.5  03/01/2018 04:32 PM             Lab Results         Component  Value  Date/Time            aPTT  30.7  11/02/2016 09:28 AM          Lab Results         Component  Value  Date/Time            INR  0.9  11/02/2016 09:28 AM            Prothrombin time  9.7  11/02/2016 09:28 AM        No components found for: LAST LIPIDS      ATTENTION:    This medical record was transcribed using an electronic medical records/speech recognition system.  Although proofread, it may and can contain electronic, spelling and other errors.  Corrections may be executed at a later time.  Please feel free to  contact us for any clarifications as needed.         Margette Fast, MD

## 2018-05-24 ENCOUNTER — Inpatient Hospital Stay
Admit: 2018-05-24 | Discharge: 2018-05-25 | Disposition: A | Discharge status: Home / Self Care | Payer: PRIVATE HEALTH INSURANCE | Attending: Student in an Organized Health Care Education/Training Program

## 2018-05-24 DIAGNOSIS — L500 Allergic urticaria: Secondary | ICD-10-CM

## 2018-05-24 MED ORDER — FAMOTIDINE (PF) 20 MG/2 ML IV
20 mg/2 mL | INTRAVENOUS | Status: AC
Start: 2018-05-24 — End: 2018-05-24

## 2018-05-24 MED ORDER — FAMOTIDINE 20 MG TAB
20 mg | ORAL_TABLET | Freq: Two times a day (BID) | ORAL | 0 refills | Status: DC
Start: 2018-05-24 — End: 2018-05-24

## 2018-05-24 MED ORDER — DIPHENHYDRAMINE HCL 50 MG/ML IJ SOLN
50 mg/mL | INTRAMUSCULAR | Status: AC
Start: 2018-05-24 — End: 2018-05-24
  Administered 2018-05-24: 22:00:00 via INTRAVENOUS

## 2018-05-24 MED ORDER — DIPHENHYDRAMINE 25 MG CAP
25 mg | ORAL_CAPSULE | Freq: Four times a day (QID) | ORAL | 0 refills | Status: AC | PRN
Start: 2018-05-24 — End: 2018-05-28

## 2018-05-24 MED ORDER — DIPHENHYDRAMINE HCL 50 MG/ML IJ SOLN
50 mg/mL | INTRAMUSCULAR | Status: DC
Start: 2018-05-24 — End: 2018-05-24

## 2018-05-24 MED ORDER — EPINEPHRINE 0.3 MG/0.3 ML IM PEN INJECTOR
0.3 mg/ mL | INJECTION | Freq: Once | INTRAMUSCULAR | 0 refills | Status: AC | PRN
Start: 2018-05-24 — End: 2018-05-24

## 2018-05-24 MED ORDER — FAMOTIDINE (PF) 20 MG/2 ML IV
20 mg/2 mL | INTRAVENOUS | Status: AC
Start: 2018-05-24 — End: 2018-05-24
  Administered 2018-05-24: 22:00:00 via INTRAVENOUS

## 2018-05-24 MED ORDER — PREDNISONE 20 MG TAB
20 mg | ORAL | Status: AC
Start: 2018-05-24 — End: 2018-05-24
  Administered 2018-05-24: 22:00:00 via ORAL

## 2018-05-24 MED FILL — PREDNISONE 20 MG TAB: 20 mg | ORAL | Qty: 1

## 2018-05-24 MED FILL — FAMOTIDINE (PF) 20 MG/2 ML IV: 20 mg/2 mL | INTRAVENOUS | Qty: 2

## 2018-05-24 MED FILL — DIPHENHYDRAMINE HCL 50 MG/ML IJ SOLN: 50 mg/mL | INTRAMUSCULAR | Qty: 1

## 2018-05-24 NOTE — ED Triage Notes (Signed)
The patient states she was eating lunch in the cafeteria when she developed some itching and became sweaty. She arrived in the ED very anxious with red blotches forming on her body and arms. She denies any difficulty breathing and her speech is clear. She was immediately taken to room 21 where she was evaluated by Dr. Stanford Breed and an IV was established.
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
## 2018-05-24 NOTE — ED Notes (Signed)
Patient discharged from ED by provider. Discharge instructions reviewed with patient and all questions answered. Patient ambulatory from ED in NAD.

## 2018-05-24 NOTE — ED Notes (Signed)
The patient states she was eating lunch in the cafeteria when she developed some itching and became sweaty. She arrived in the ED very anxious with red blotches forming on her body and arms. She denies any difficulty breathing and her speech is clear. She was immediately taken to room 21 where she was evaluated by Dr. Stanford Breed and an IV was established.
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
## 2018-05-25 MED ORDER — FAMOTIDINE 20 MG TAB
20 mg | ORAL_TABLET | Freq: Two times a day (BID) | ORAL | 0 refills | Status: AC
Start: 2018-05-25 — End: 2018-06-03

## 2018-05-25 NOTE — Progress Notes (Signed)
Patient on report as eligible for Case Management.  Left discreet message on voicemail with this CM contact information.  Will attempt to contact again to offer [place] Management services.  Patient had an ED visit yesterday, 2/6, for an allergic reaction.  She was discharged with an EpiPen and advised to take Benadryl and pepcid and return precautions were given.    I will reach out again on 2/10 if I don't hear back from her.

## 2018-05-25 NOTE — Progress Notes (Signed)
Patient on report as eligible for Case Management.  Left discreet message on voicemail with this CM contact information.  Will attempt to contact again to offer [place] Management services.  Patient had an ED visit yesterday, 2/6, for an allergic reaction.  She was discharged with an EpiPen and advised to take Benadryl and pepcid and return precautions were given.    I will reach out again on 2/10 if I don't hear back from her.

## 2018-05-28 NOTE — Progress Notes (Signed)
CCM progress note     Patient eligible for [place] care management    Received notification of discharge from [facility] on 2/6 for allergic reaction to [place] in food she had eaten (no respiratory or airway issues)    Contacted patient to discuss post discharge needs and offer care management services. Two patient identifiers verified. Discussed the care management program.  Patient agrees to care management services at this time.     Unable to complete med rec and full goals as patient had to get off of the phone.  Will finish assessment on our next call.    Patient's primary care provider relationship reviewed with patient and modified, as applicable.    Care management assessment completed:    Condition Focused Assessment:  Patient reported vital signs and important numbers to know:    Last BP 131/83   Pulse for 60 seconds 102   Temp 98.8   Pain 0-10 0   Location/pain characteristics n/a   Last daily weight in lbs 258 lbs     In the last 24 hour have you experienced;    Fever no    Low body temperature no    Chills or shaking no    Sweating no    Fast heart rate no    Fast breathing no    Dizziness/lightheadedness no    Confusion or unusual change in mental status no    Diarrhea no    Nausea no    Vomiting no    Shortness of breath or difficulty breathing no    Less urine output no    Cold, clammy, and pale skin no     Skin rash or skin color changes no  Skin- any open wounds or incisions? no  Description of wound- n/a  New or worsening pain? no  If yes, pain rated 0-10: n/a Location/pain characteristics: n/a   New or worsening numbness or tingling? no  If yes, location of numbness and tingling: n/a  Any IV access site for antibiotics? n/a  Recent urinary catheter? n/a  Does patient have incentive spirometer? no  If yes, how frequently is patient using incentive spirometer? n/a      Red Flags:  Has shown family how to use her new epipen.  Give an epinephrine shot if:   ?? ?? You think you are having a severe allergic reaction.   ?? ?? You have symptoms in more than one body area, such as mild nausea and an itchy mouth.   ??After giving an epinephrine shot call 911, even if you feel better.  ??Call 911 if:  ?? ?? You have symptoms of a severe allergic reaction. These may include:  ? Sudden raised, red areas (hives) all over your body.  ? Swelling of the throat, mouth, lips, or tongue.  ? Trouble breathing.  ? Passing out (losing consciousness). Or you may feel very lightheaded or suddenly feel weak, confused, or restless.   ?? ?? You have been given an epinephrine shot, even if you feel better.   ??Call your doctor now or seek immediate medical care if:  ?? ?? You have symptoms of an allergic reaction, such as:  ? A rash or hives (raised, red areas on the skin).  ? Itching.  ? Swelling.  ? Belly pain, nausea, or vomiting.   ??Watch closely for changes in your health, and be sure to contact your doctor if:  ?? ?? You do not get better as expected.  Medications:  New Medications at Discharge: benadryl, epipen (discussed instructions for using), pepcid  Changed Medications at Discharge: no  Discontinued Medications at Discharge: no  Current Outpatient Medications   Medication Sig   ??? diphenhydrAMINE (BENADRYL) 25 mg capsule Take 1 Cap by mouth every six (6) hours as needed for Itching or Skin Irritation for up to 4 days.   ??? famotidine (PEPCID) 20 mg tablet Take 1 Tab by mouth two (2) times a day for 10 days.   ??? diclofenac EC (VOLTAREN) 75 mg EC tablet Take 1 Tab by mouth two (2) times a day.   ??? cyclobenzaprine (FLEXERIL) 10 mg tablet Take 1 Tab by mouth three (3) times daily as needed for Muscle Spasm(s).   ??? lidocaine (LIDODERM) 5 % Apply patch to the affected area for 12 hours a day and remove for 12 hours a day.   ??? insulin glargine (LANTUS SOLOSTAR U-100 INSULIN) 100 unit/mL (3 mL) inpn 74 Units Every Bedtime  Indications: type 2 diabetes mellitus    ??? lisinopril (PRINIVIL, ZESTRIL) 10 mg tablet TAKE 1 TABLET BY MOUTH EVERY DAY   ??? metFORMIN ER (GLUCOPHAGE XR) 500 mg tablet TAKE 2 TABLETS BY MOUTH DAILY WITH DINNER   ??? acetaminophen (TYLENOL ARTHRITIS PAIN) 650 mg TbER Take 650 mg by mouth every eight (8) hours.   ??? TRUE METRIX GLUCOSE METER misc USE TO CHECK BLOOD SUGAR 1 TO 3 TIMES A DAY AS DIRECTED   ??? Blood-Glucose Meter monitoring kit Check blood sugar 1-3 times daily as directed by physician E 11.9   ??? Insulin Needles, Disposable, 31 gauge x 5/16" ndle Use with Lantus Solostar pen   ??? lancets misc Blood sugar 1-3 times daily as directed by physician E 11.9   ??? valACYclovir (VALTREX) 1 gram tablet Take 1 Tab by mouth two (2) times a day.     No current facility-administered medications for this visit.      Barriers/Support system:  patient and mother    Barriers/Challenges to Care: []   Decline in memory    []   Language barrier   []   Emotional                  []   Limited mobility  []   Lack of motivation     []  Vision, hearing or cognitive impairment []   Knowledge []  Financial Barriers []   Lack of support  []   Pain []   Other [x]   None    CM Identified  Problems   1. Potential knowledge deficit  2. Risk for allergic reaction  3. Risk for impaired health maintenance    Goals        Patient Stated    ??? Demonstrates no return to ED or red flags within 30 days (pt-stated)      ?? Assess patient knowledge of how to contact the provider for questions if needed;  ?? Educate patient on importance of monitoring for any red flags;  ?? Review importance of reporting any changes, ???red flags??? to the provider and/or PCP;  ?? Assess patient???s knowledge of discharge instructions and any restrictions;  ?? Educate patient when to call 911;            Key pt activities to achieve better health:   []   Weight loss  []   Improved diabetic control  []   Decreased cholesterol levels  []   Decreased blood pressure  [x]   Take care with hidden spices in food  PCP/Specialist follow up:   No follow up scheduled at this time.  Will discuss importance of this on the next call.    Future Appointments   Date Time Provider [place]   11/22/2018  2:20 PM Prabhakar, Janyce Llanos, MD [place]      Reviewed red flags with patient, and patient verbalizes understanding.  Patient given an opportunity to ask questions. No other clinical/social/functional needs noted.   The patient agrees to contact the PCP office for questions related to their healthcare.  The patient expressed thanks, offered no additional questions and ended the call.      Plan for next call:  Unable to complete med rec and goals due to patient needing to get off the phone.  Will complete assessment on our next call.  Call back 2/18.

## 2018-05-28 NOTE — Progress Notes (Signed)
Faxed referral & demographics over to [facility] at   Fax # [phone]

## 2018-05-28 NOTE — Progress Notes (Signed)
CCM progress note     Patient eligible for [facility] care management    Received notification of discharge from [facility] on 2/6 for allergic reaction to [place] in food she had eaten (no respiratory or airway issues)    Contacted patient to discuss post discharge needs and offer care management services. Two patient identifiers verified. Discussed the care management program.  Patient agrees to care management services at this time.     Unable to complete med rec and full goals as patient had to get off of the phone.  Will finish assessment on our next call.    Patient's primary care provider relationship reviewed with patient and modified, as applicable.    Care management assessment completed:    Condition Focused Assessment:  Patient reported vital signs and important numbers to know:    Last BP 131/83   Pulse for 60 seconds 102   Temp 98.8   Pain 0-10 0   Location/pain characteristics n/a   Last daily weight in lbs 258 lbs     In the last 24 hour have you experienced;    Fever no    Low body temperature no    Chills or shaking no    Sweating no    Fast heart rate no    Fast breathing no    Dizziness/lightheadedness no    Confusion or unusual change in mental status no    Diarrhea no    Nausea no    Vomiting no    Shortness of breath or difficulty breathing no    Less urine output no    Cold, clammy, and pale skin no     Skin rash or skin color changes no  Skin- any open wounds or incisions? no  Description of wound- n/a  New or worsening pain? no  If yes, pain rated 0-10: n/a Location/pain characteristics: n/a   New or worsening numbness or tingling? no  If yes, location of numbness and tingling: n/a  Any IV access site for antibiotics? n/a  Recent urinary catheter? n/a  Does patient have incentive spirometer? no  If yes, how frequently is patient using incentive spirometer? n/a      Red Flags:  Has shown family how to use her new epipen.  Give an epinephrine shot if:    You think you are having a severe  allergic reaction.     You have symptoms in more than one body area, such as mild nausea and an itchy mouth.   After giving an epinephrine shot call 911, even if you feel better.  Call 911 if:    You have symptoms of a severe allergic reaction. These may include:  ? Sudden raised, red areas (hives) all over your body.  ? Swelling of the throat, mouth, lips, or tongue.  ? Trouble breathing.  ? Passing out (losing consciousness). Or you may feel very lightheaded or suddenly feel weak, confused, or restless.     You have been given an epinephrine shot, even if you feel better.   Call your doctor now or seek immediate medical care if:    You have symptoms of an allergic reaction, such as:  ? A rash or hives (raised, red areas on the skin).  ? Itching.  ? Swelling.  ? Belly pain, nausea, or vomiting.   Watch closely for changes in your health, and be sure to contact your doctor if:    You do not get better as expected.  Medications:  New Medications at Discharge: benadryl, epipen (discussed instructions for using), pepcid  Changed Medications at Discharge: no  Discontinued Medications at Discharge: no  Current Outpatient Medications   Medication Sig   . diphenhydrAMINE (BENADRYL) 25 mg capsule Take 1 Cap by mouth every six (6) hours as needed for Itching or Skin Irritation for up to 4 days.   . famotidine (PEPCID) 20 mg tablet Take 1 Tab by mouth two (2) times a day for 10 days.   . diclofenac EC (VOLTAREN) 75 mg EC tablet Take 1 Tab by mouth two (2) times a day.   . cyclobenzaprine (FLEXERIL) 10 mg tablet Take 1 Tab by mouth three (3) times daily as needed for Muscle Spasm(s).   [place] lidocaine (LIDODERM) 5 % Apply patch to the affected area for 12 hours a day and remove for 12 hours a day.   . insulin glargine (LANTUS SOLOSTAR U-100 INSULIN) 100 unit/mL (3 mL) inpn 74 Units Every Bedtime  Indications: type 2 diabetes mellitus   . lisinopril (PRINIVIL, ZESTRIL) 10 mg tablet TAKE 1 TABLET BY MOUTH EVERY  DAY   . metFORMIN ER (GLUCOPHAGE XR) 500 mg tablet TAKE 2 TABLETS BY MOUTH DAILY WITH DINNER   . acetaminophen (TYLENOL ARTHRITIS PAIN) 650 mg TbER Take 650 mg by mouth every eight (8) hours.   . TRUE METRIX GLUCOSE METER misc USE TO CHECK BLOOD SUGAR 1 TO 3 TIMES A DAY AS DIRECTED   . Blood-Glucose Meter monitoring kit Check blood sugar 1-3 times daily as directed by physician E 11.9   . Insulin Needles, Disposable, 31 gauge x 5/16" ndle Use with Lantus Solostar pen   . lancets misc Blood sugar 1-3 times daily as directed by physician E 11.9   . valACYclovir (VALTREX) 1 gram tablet Take 1 Tab by mouth two (2) times a day.     No current facility-administered medications for this visit.      Barriers/Support system:  patient and mother    Barriers/Challenges to Care: []   Decline in memory    []   Language barrier   []   Emotional                  []   Limited mobility  []   Lack of motivation     []  Vision, hearing or cognitive impairment []   Knowledge []  Financial Barriers []   Lack of support  []   Pain []   Other [x]   None    CM Identified  Problems   1. Potential knowledge deficit  2. Risk for allergic reaction  3. Risk for impaired health maintenance    Goals        Patient Stated    . Demonstrates no return to ED or red flags within 30 days (pt-stated)       Assess patient knowledge of how to contact the provider for questions if needed;   Educate patient on importance of monitoring for any red flags;   Review importance of reporting any changes, "red flags" to the provider and/or PCP;   Assess patient's knowledge of discharge instructions and any restrictions;   Educate patient when to call 911;            Key pt activities to achieve better health:   []   Weight loss  []   Improved diabetic control  []   Decreased cholesterol levels  []   Decreased blood pressure  [x]   Take care with hidden spices in food       PCP/Specialist  follow up:   No follow up scheduled at this time.  Will discuss importance of this on the  next call.    Future Appointments   Date Time Provider Department Center   11/22/2018  2:20 PM Prabhakar, Sharion Balloon, MD [facility]      Reviewed red flags with patient, and patient verbalizes understanding.  Patient given an opportunity to ask questions. No other clinical/social/functional needs noted.   The patient agrees to contact the PCP office for questions related to their healthcare.  The patient expressed thanks, offered no additional questions and ended the call.      Plan for next call:  Unable to complete med rec and goals due to patient needing to get off the phone.  Will complete assessment on our next call.  Call back 2/18.

## 2018-05-28 NOTE — Progress Notes (Signed)
Faxed referral & demographics over to [facility] at   Fax # [phone]

## 2018-06-05 ENCOUNTER — Inpatient Hospital Stay
Admit: 2018-06-05 | Discharge: 2018-06-05 | Disposition: A | Discharge status: Home / Self Care | Payer: PRIVATE HEALTH INSURANCE | Attending: Student in an Organized Health Care Education/Training Program

## 2018-06-05 DIAGNOSIS — J069 Acute upper respiratory infection, unspecified: Secondary | ICD-10-CM

## 2018-06-05 LAB — INFLUENZA A+B VIRAL AGS
Flu A Antigen: NEGATIVE
Influenza A Antigen: NEGATIVE
Influenza B Antigen: NEGATIVE
Influenza B Antigen: NEGATIVE

## 2018-06-05 MED ORDER — PREDNISONE 20 MG TAB
20 mg | ORAL | Status: AC
Start: 2018-06-05 — End: 2018-06-05
  Administered 2018-06-05: 16:00:00 via ORAL

## 2018-06-05 MED ORDER — PREDNISONE 50 MG TAB
50 mg | ORAL_TABLET | Freq: Every day | ORAL | 0 refills | Status: AC
Start: 2018-06-05 — End: 2018-06-08

## 2018-06-05 MED FILL — PREDNISONE 20 MG TAB: 20 mg | ORAL | Qty: 3

## 2018-06-05 NOTE — ED Notes (Signed)
The patient was discharged home by Dr. Ouida Sills.  Pt ambulatory to discharge.

## 2018-06-05 NOTE — ED Triage Notes (Signed)
Pt reports cough and sinus pain that started this morning. Reports "I think I have bronchitis."
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
## 2018-06-05 NOTE — ED Notes (Signed)
 Pt reports cough and sinus pain that started this morning. Reports I think I have bronchitis.

## 2018-06-05 NOTE — ED Notes (Signed)
The patient was discharged home by Dr. Dareen Piano.  Pt ambulatory to discharge.
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
## 2018-06-06 NOTE — Telephone Encounter (Signed)
Called pt, and left a voice message, advising she will need to be seen for an appointment.     Advised pt to call office back to schedule.

## 2018-06-06 NOTE — Telephone Encounter (Signed)
-----   Message from Nicole Cella sent at 06/06/2018  3:15 PM EST -----  Regarding: Dr. Scot Dock  Medication Refill    Caller (if not patient):      Relationship of caller (if not patient):      Best contact number(s):[phone]      Name of medication and dosage if known:Rx for antibiotic for yeast infection and a topical cream for itching      Is patient out of this medication (yes/no):      Pharmacy name:[facility]    Pharmacy listed in chart? (yes/no):yes  Pharmacy phone number:[phone]      Details to clarify the request: Pt stated she was recently in the hosp for an allergic reaction on 05/27/18 and was prescribed Prednisone , and also on yesterday for bronchitis and was also given Prednisone and has a yeast infection, but can not get OTC medication with her HSA card and would like the doctor to call 2 Rxs into the pharmacy today, if poss.      Nicole Cella

## 2018-06-06 NOTE — Progress Notes (Signed)
Briefly spoke with patient who states she is at the [place] office with her son, waiting to be called back.  Discussed ED visit, states she was diagnosed with bronchitis.  Talked about new medication, prednisone.  Completed med rec.  Patient had to go for the appointment, stated she would call me back.

## 2018-06-06 NOTE — Progress Notes (Signed)
Briefly spoke with patient who states she is at the [place] office with her son, waiting to be called back.  Discussed ED visit, states she was diagnosed with bronchitis.  Talked about new medication, prednisone.  Completed med rec.  Patient had to go for the appointment, stated she would call me back.

## 2018-06-07 ENCOUNTER — Ambulatory Visit: Attending: Family Medicine | Primary: Family Medicine

## 2018-06-07 ENCOUNTER — Encounter

## 2018-06-07 ENCOUNTER — Inpatient Hospital Stay: Admit: 2018-06-07 | Payer: MEDICAID | Primary: Family Medicine

## 2018-06-07 ENCOUNTER — Ambulatory Visit
Admit: 2018-06-07 | Discharge: 2018-06-07 | Payer: PRIVATE HEALTH INSURANCE | Attending: Family Medicine | Primary: Family Medicine

## 2018-06-07 DIAGNOSIS — B379 Candidiasis, unspecified: Secondary | ICD-10-CM

## 2018-06-07 MED ORDER — TRUE METRIX GLUCOSE TEST STRIP
ORAL_STRIP | 1 refills | Status: DC
Start: 2018-06-07 — End: 2018-08-03

## 2018-06-07 MED ORDER — LANCETS
11 refills | Status: DC
Start: 2018-06-07 — End: 2019-10-09

## 2018-06-07 MED ORDER — FLUCONAZOLE 150 MG TAB
150 mg | ORAL_TABLET | Freq: Once | ORAL | 0 refills | Status: AC
Start: 2018-06-07 — End: 2018-06-07

## 2018-06-07 MED ORDER — TRUE METRIX GLUCOSE METER
0 refills | Status: DC
Start: 2018-06-07 — End: 2019-10-09

## 2018-06-07 MED ORDER — MICONAZOLE NITRATE 2 % TOPICAL CREAM
2 % | Freq: Two times a day (BID) | CUTANEOUS | 0 refills | Status: DC
Start: 2018-06-07 — End: 2019-05-15

## 2018-06-07 MED ORDER — FLUTICASONE 50 MCG/ACTUATION NASAL SPRAY, SUSP
50 mcg/actuation | Freq: Every day | NASAL | 5 refills | Status: DC
Start: 2018-06-07 — End: 2019-05-15

## 2018-06-07 NOTE — Patient Instructions (Signed)
Nu swab sent, empiric treatment with diflucan and miconazole, f/u if worsening  DM supplies sent, labs pending  PND to be treated with flonase x 2 wk, sent to pharmacy    F/u for DM visit ASAP    Encounter time today was >25 minutes and more than 50% of this encounter was spent in counseling face-to-face regarding Diagnosis, Patient Education, Medication Management, Compliance and Impressions.

## 2018-06-07 NOTE — Progress Notes (Signed)
Called and spoke with pt, and she has been advised of this and agrees with plan.

## 2018-06-07 NOTE — Progress Notes (Signed)
Vaginal swab positive only for the usual kind of yeast. The medication prescribed previously should cover this and improve symptoms. This will likely keep recurring if we do not get blood sugar under control

## 2018-06-07 NOTE — Progress Notes (Signed)
Urgent: Patient scheduled for 345 today, please call her and have her get her labs done before she comes to see me

## 2018-06-07 NOTE — Progress Notes (Signed)
Chief Complaint   Patient presents with   ??? Diabetes     follow up-pt had labs drawn today    ??? Yeast Infection     white vaginal discharge x 4 days
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
## 2018-06-07 NOTE — Progress Notes (Signed)
Worsening diabetic control, please keep your upcoming appointment and will talk about all of your labs.  It is imperative that we work to get this under control

## 2018-06-07 NOTE — Progress Notes (Signed)
 Chief Complaint   Patient presents with   . Diabetes     follow up-pt had labs drawn today    . Yeast Infection     white vaginal discharge x 4 days

## 2018-06-07 NOTE — Progress Notes (Signed)
Called and spoke with pt, and she has been advised of this and agrees with plan.

## 2018-06-08 LAB — CBC
Hematocrit: 39.4 % (ref 35.0–47.0)
Hemoglobin: 12.2 g/dL (ref 11.5–16.0)
MCH: 26.1 PG (ref 26.0–34.0)
MCHC: 31 g/dL (ref 30.0–36.5)
MCV: 84.2 FL (ref 80.0–99.0)
MPV: 11.9 FL (ref 8.9–12.9)
NRBC Absolute: 0 10*3/uL (ref 0.00–0.01)
Nucleated RBCs: 0 PER 100 WBC
Platelets: 413 10*3/uL — ABNORMAL HIGH (ref 150–400)
RBC: 4.68 M/uL (ref 3.80–5.20)
RDW: 14.8 % — ABNORMAL HIGH (ref 11.5–14.5)
WBC: 13.3 10*3/uL — ABNORMAL HIGH (ref 3.6–11.0)

## 2018-06-08 LAB — COMPREHENSIVE METABOLIC PANEL
ALT: 15 U/L (ref 12–78)
AST: 6 U/L — ABNORMAL LOW (ref 15–37)
Albumin/Globulin Ratio: 0.9 — ABNORMAL LOW (ref 1.1–2.2)
Albumin: 3.5 g/dL (ref 3.5–5.0)
Alkaline Phosphatase: 82 U/L (ref 45–117)
Anion Gap: 9 mmol/L (ref 5–15)
BUN/Creatinine Ratio: 12 (ref 12–20)
BUN: 10 MG/DL (ref 6–20)
CO2: 27 mmol/L (ref 21–32)
Calcium: 9 MG/DL (ref 8.5–10.1)
Chloride: 98 mmol/L (ref 97–108)
Creatinine: 0.85 MG/DL (ref 0.55–1.02)
GFR African American: >60 mL/min/{1.73_m2} (ref >60)
Globulin: 4.1 g/dL — ABNORMAL HIGH (ref 2.0–4.0)
Glucose: 391 mg/dL — ABNORMAL HIGH (ref 65–100)
Potassium: 3.9 mmol/L (ref 3.5–5.1)
Sodium: 134 mmol/L — ABNORMAL LOW (ref 136–145)
Total Bilirubin: 0.3 MG/DL (ref 0.2–1.0)
Total Protein: 7.6 g/dL (ref 6.4–8.2)
eGFR NON-AA: >60 mL/min/{1.73_m2} (ref >60)

## 2018-06-08 LAB — LIPID PANEL
CHOL/HDL Ratio: 4 (ref 0.0–5.0)
Chol/HDL Ratio: 4 (ref 0.0–5.0)
Cholesterol, Total: 187 MG/DL (ref <200)
Cholesterol, total: 187 MG/DL (ref <200)
HDL Cholesterol: 47 MG/DL
HDL: 47 MG/DL
LDL Calculated: 79.2 MG/DL (ref 0–100)
LDL, calculated: 79.2 MG/DL (ref 0–100)
Triglyceride: 304 MG/DL — ABNORMAL HIGH (ref <150)
Triglycerides: 304 MG/DL — ABNORMAL HIGH (ref <150)
VLDL Cholesterol Calculated: 60.8 MG/DL
VLDL, calculated: 60.8 MG/DL

## 2018-06-08 LAB — MICROALBUMIN / CREATININE URINE RATIO
Creatinine, Ur: 58.5 mg/dL
Microalb, Ur: 2.85 MG/DL
Microalb/Creat Ratio: 49 mg/g — ABNORMAL HIGH (ref 0–30)

## 2018-06-08 LAB — HEMOGLOBIN A1C W/EAG
Estimated Avg Glucose: 318 mg/dL
Hemoglobin A1C: 12.7 % — ABNORMAL HIGH (ref 4.0–5.6)

## 2018-06-08 LAB — METABOLIC PANEL, COMPREHENSIVE
A-G Ratio: 0.9 — ABNORMAL LOW (ref 1.1–2.2)
ALT (SGPT): 15 U/L (ref 12–78)
AST (SGOT): 6 U/L — ABNORMAL LOW (ref 15–37)
Albumin: 3.5 g/dL (ref 3.5–5.0)
Alk. phosphatase: 82 U/L (ref 45–117)
Anion gap: 9 mmol/L (ref 5–15)
BUN/Creatinine ratio: 12 (ref 12–20)
BUN: 10 MG/DL (ref 6–20)
Bilirubin, total: 0.3 MG/DL (ref 0.2–1.0)
CO2: 27 mmol/L (ref 21–32)
Calcium: 9 MG/DL (ref 8.5–10.1)
Chloride: 98 mmol/L (ref 97–108)
Creatinine: 0.85 MG/DL (ref 0.55–1.02)
GFR est AA: >60 mL/min/{1.73_m2} (ref >60)
GFR est non-AA: >60 mL/min/{1.73_m2} (ref >60)
Globulin: 4.1 g/dL — ABNORMAL HIGH (ref 2.0–4.0)
Glucose: 391 mg/dL — ABNORMAL HIGH (ref 65–100)
Potassium: 3.9 mmol/L (ref 3.5–5.1)
Protein, total: 7.6 g/dL (ref 6.4–8.2)
Sodium: 134 mmol/L — ABNORMAL LOW (ref 136–145)

## 2018-06-08 LAB — MICROALBUMIN, UR, RAND W/ MICROALB/CREAT RATIO
Creatinine, urine random: 58.5 mg/dL
Microalbumin,urine random: 2.85 MG/DL
Microalbumin/Creat ratio (mg/g creat): 49 mg/g — ABNORMAL HIGH (ref 0–30)

## 2018-06-08 LAB — CBC W/O DIFF
ABSOLUTE NRBC: 0 10*3/uL (ref 0.00–0.01)
HCT: 39.4 % (ref 35.0–47.0)
HGB: 12.2 g/dL (ref 11.5–16.0)
MCH: 26.1 PG (ref 26.0–34.0)
MCHC: 31 g/dL (ref 30.0–36.5)
MCV: 84.2 FL (ref 80.0–99.0)
MPV: 11.9 FL (ref 8.9–12.9)
NRBC: 0 PER 100 WBC
PLATELET: 413 10*3/uL — ABNORMAL HIGH (ref 150–400)
RBC: 4.68 M/uL (ref 3.80–5.20)
RDW: 14.8 % — ABNORMAL HIGH (ref 11.5–14.5)
WBC: 13.3 10*3/uL — ABNORMAL HIGH (ref 3.6–11.0)

## 2018-06-08 LAB — HEMOGLOBIN A1C WITH EAG
Est. average glucose: 318 mg/dL
Hemoglobin A1c: 12.7 % — ABNORMAL HIGH (ref 4.0–5.6)

## 2018-06-08 LAB — SAMPLES BEING HELD

## 2018-06-08 LAB — URINE CULTURE HOLD SAMPLE

## 2018-06-08 MED ORDER — METFORMIN SR 500 MG 24 HR TABLET
500 mg | ORAL_TABLET | ORAL | 2 refills | Status: DC
Start: 2018-06-08 — End: 2018-08-31

## 2018-06-08 MED ORDER — LISINOPRIL 10 MG TAB
10 mg | ORAL_TABLET | ORAL | 2 refills | Status: DC
Start: 2018-06-08 — End: 2018-08-31

## 2018-06-08 NOTE — Progress Notes (Signed)
Worsening diabetic control, please keep your upcoming appointment and will talk about all of your labs.  It is imperative that we work to get this under control

## 2018-06-12 LAB — NUSWAB VAGINITIS PLUS (VG+)
Candida albicans, NAA: POSITIVE — AB
Candida glabrata by PCR: NEGATIVE
Chlamydia trachomatis, NAA: NEGATIVE
Neisseria Gonorrhoeae, NAA: NEGATIVE
Trichomonas Vaginalis by NAA: NEGATIVE

## 2018-06-12 LAB — NUSWAB VAGINITIS PLUS
C. albicans, NAA: POSITIVE — AB
C. glabrata, NAA: NEGATIVE
C. trachomatis, NAA: NEGATIVE
N. gonorrhoeae, NAA: NEGATIVE
T. vaginalis, NAA: NEGATIVE
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
## 2018-06-14 ENCOUNTER — Ambulatory Visit: Attending: Family Medicine | Primary: Family Medicine

## 2018-06-14 ENCOUNTER — Encounter: Attending: Family Medicine | Primary: Family Medicine

## 2018-06-14 ENCOUNTER — Ambulatory Visit
Admit: 2018-06-14 | Discharge: 2018-06-14 | Payer: PRIVATE HEALTH INSURANCE | Attending: Family Medicine | Primary: Family Medicine

## 2018-06-14 DIAGNOSIS — E1165 Type 2 diabetes mellitus with hyperglycemia: Secondary | ICD-10-CM

## 2018-06-14 MED ORDER — ROSUVASTATIN 10 MG TAB
10 mg | ORAL_TABLET | Freq: Every evening | ORAL | 0 refills | Status: DC
Start: 2018-06-14 — End: 2018-08-31

## 2018-06-14 MED ORDER — GLIPIZIDE 5 MG TAB
5 mg | ORAL_TABLET | Freq: Two times a day (BID) | ORAL | 1 refills | Status: DC
Start: 2018-06-14 — End: 2018-08-20

## 2018-06-14 MED ORDER — FLASH GLUCOSE SENSOR KIT
PACK | 3 refills | Status: DC
Start: 2018-06-14 — End: 2019-01-29

## 2018-06-14 NOTE — Progress Notes (Signed)
Chief Complaint   Patient presents with   ??? Diabetes     Follow up     1. Have you been to the ER, urgent care clinic since your last visit?  Hospitalized since your last visit? No    2. Have you seen or consulted any other health care providers outside of the [facility] since your last visit?  Include any pap smears or colon screening. No
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
## 2018-06-14 NOTE — Patient Instructions (Signed)
Here today to discuss uncontrolled diabetes  We are going to get started on a new diabetic plan  Continue with metformin  Try her hardest to increase your exercise and modulate your diet with a goal of moderate weight loss  Referral to diabetic education  Referral to onsite nutritionist  Add glipizide which the patient reports she previously tolerated, 5 mg with breakfast and 5 mg with dinner daily.  If feeling low make sure you check your sugar and take a snack if necessary  If in 6 to 12 weeks we are not seeing this assessed that we need to see which I expect we will probably not with just the addition of 1 medicine, we will plan to try to add Ozempic or Victoza to help [place] achieve our goal of both better diabetic control and moderate weight loss.  The patient will also add rosuvastatin or Crestor, once daily in the evening time.  If she has muscle aches with this medicine I advised her to take it only Monday and Wednesday and Friday rather than every single day.  I think she will likely tolerate it very well at this low dose and hopefully it will help her triglycerides and it protects from vascular damage associated with diabetes which is important  I recommend she have an annual eye exam with an ophthalmologist or optometrist to evaluate her retina for diabetic retinopathy  We should do annual foot exams  I will see her back in 6 weeks or sooner on an as-needed basis and she will send me a message in my chart with any questions or concerns    Of note she previously tried and failed Janumet due to GI upset

## 2018-06-14 NOTE — Progress Notes (Signed)
Chief Complaint   Patient presents with   ??? Diabetes     Follow up     1. Have you been to the ER, urgent care clinic since your last visit?  Hospitalized since your last visit? No    2. Have you seen or consulted any other health care providers outside of the [facility] since your last visit?  Include any pap smears or colon screening. No
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
## 2018-06-15 ENCOUNTER — Ambulatory Visit: Attending: Pediatric Pulmonology | Primary: Family Medicine

## 2018-06-15 ENCOUNTER — Ambulatory Visit
Admit: 2018-06-15 | Discharge: 2018-06-15 | Payer: PRIVATE HEALTH INSURANCE | Attending: Pediatric Pulmonology | Primary: Family Medicine

## 2018-06-15 DIAGNOSIS — G4733 Obstructive sleep apnea (adult) (pediatric): Secondary | ICD-10-CM

## 2018-06-15 NOTE — Patient Instructions (Signed)
[address]  [place] [address]  Tel.  [phone]  Fax. [phone] [address]  [place] [address]  Tel.  [phone]  Fax. [phone] [address] [place] [address]  Tel.  [phone]  Fax. [phone]     Sleep Apnea: After Your Visit  Your Care Instructions  Sleep apnea occurs when you frequently stop breathing for 10 seconds or longer during sleep. It can be mild to severe, based on the number of times per hour that you stop breathing or have slowed breathing. Blocked or narrowed airways in your nose, mouth, or throat can cause sleep apnea. Your airway can become blocked when your throat muscles and tongue relax during sleep.  Sleep apnea is common, occurring in 1 out of 20 individuals.  Individuals having any of the following characteristics should be evaluated and treated right away due to high risk and detrimental consequences from untreated sleep apnea:  1. Obesity  2. Congestive Heart failure  3. Atrial Fibrillation  4. Uncontrolled Hypertension  5. Type II Diabetes  6. Night-time Arrhythmias  7. Stroke  8. Pulmonary Hypertension  9. High-risk Driving Populations (pilots, truck drivers, etc.)  10. Patients Considering Weight-loss Surgery    How do you know you have sleep apnea?  You probably have sleep apnea if you answer 'yes' to 3 or more of the following questions:  S - Have you been told that you Snore?   T - Are you often Tired during the day?  O - Has anyone Observed you stop breathing while sleeping?  P- Do you have (or are being treated for) high blood Pressure?    B - Are you obese (Body Mass Index > 35)?  A - Is your Age 40 years old or older?  N - Is your Neck size greater than 16 inches?  G - Are you female Gender?  A sleep physician can prescribe a breathing device that prevents tissues in the throat from blocking your airway. Or your doctor may recommend using a dental device (oral breathing device) to help keep your airway  open. In some cases, surgery may be needed to remove enlarged tissues in the throat.  Follow-up care is a key part of your treatment and safety. Be sure to make and go to all appointments, and call your doctor if you are having problems. It's also a good idea to know your test results and keep a list of the medicines you take.  How can you care for yourself at home?  ?? Lose weight, if needed. It may reduce the number of times you stop breathing or have slowed breathing.  ?? Go to bed at the same time every night.  ?? Sleep on your side. It may stop mild apnea. If you tend to roll onto your back, sew a pocket in the back of your pajama top. Put a tennis ball into the pocket, and stitch the pocket shut. This will help keep you from sleeping on your back.  ?? Avoid alcohol and medicines such as sleeping pills and sedatives before bed.  ?? Do not smoke. Smoking can make sleep apnea worse. If you need help quitting, talk to your doctor about stop-smoking programs and medicines. These can increase your chances of quitting for good.  ?? Prop up the head of your bed 4 to 6 inches by putting bricks under the legs of the bed.  ?? Treat breathing problems, such as a stuffy nose, caused   by a cold or allergies.  ?? Use a continuous positive airway pressure (CPAP) breathing machine if lifestyle changes do not help your apnea and your doctor recommends it. The machine keeps your airway from closing when you sleep.  ?? If CPAP does not help you, ask your doctor whether you should try other breathing machines. A bilevel positive airway pressure machine has two types of air pressure????????one for breathing in and one for breathing out. Another device raises or lowers air pressure as needed while you breathe.  ?? If your nose feels dry or bleeds when using one of these machines, talk with your doctor about increasing moisture in the air. A humidifier may help.  ?? If your nose is runny or stuffy from using a breathing machine, talk  with your doctor about using decongestants or a corticosteroid nasal spray.  When should you call for help?  Watch closely for changes in your health, and be sure to contact your doctor if:  ?? You still have sleep apnea even though you have made lifestyle changes.  ?? You are thinking of trying a device such as CPAP.  ?? You are having problems using a CPAP or similar machine.                Where can you learn more?   Go to http://www.healthwise.net/[facility].  Enter J936 in the search box to learn more about "Sleep Apnea: After Your Visit."   ?? 2006-2010 Healthwise, Incorporated. Care instructions adapted under license by [place] (which disclaims liability or warranty for this information). This care instruction is for use with your licensed healthcare professional. If you have questions about a medical condition or this instruction, always ask your healthcare professional. Healthwise disclaims any warranty or liability for your use of this information.      PROPER SLEEP HYGIENE    What to avoid  ?? Do not have drinks with caffeine, such as coffee or black tea, for 8 hours before bed.  ?? Do not smoke or use other types of tobacco near bedtime. Nicotine is a stimulant and can keep you awake.  ?? Avoid drinking alcohol late in the evening, because it can cause you to wake in the middle of the night.  ?? Do not eat a big meal close to bedtime. If you are hungry, eat a light snack.  ?? Do not drink a lot of water close to bedtime, because the need to urinate may wake you up during the night.  ?? Do not read or watch TV in bed. Use the bed only for sleeping and sexual activity.  What to try  ?? Go to bed at the same time every night, and wake up at the same time every morning. Do not take naps during the day.  ?? Keep your bedroom quiet, dark, and cool.  ?? Get regular exercise, but not within 3 to 4 hours of your bedtime..  ?? Sleep on a comfortable pillow and mattress.   ?? If watching the clock makes you anxious, turn it facing away from you so you cannot see the time.  ?? If you worry when you lie down, start a worry book. Well before bedtime, write down your worries, and then set the book and your concerns aside.  ?? Try meditation or other relaxation techniques before you go to bed.  ?? If you cannot fall asleep, get up and go to another room until you feel sleepy. Do something relaxing. Repeat your bedtime routine   before you go to bed again.  ?? Make your house quiet and calm about an hour before bedtime. Turn down the lights, turn off the TV, log off the computer, and turn down the volume on music. This can help you relax after a busy day.    Drowsy Driving  The [place] National Highway Traffic Safety Administration cites drowsiness as a causing factor in more than 100,000 police reported crashes annually, resulting in 76,000 injuries and 1,500 deaths. Other surveys suggest 55% of people polled have driven while drowsy in the past year, 23% had fallen asleep but not crashed, 3% crashed, and 2% had and accident due to drowsy driving.  Who is at risk?   Young Drivers: One study of drowsy driving accidents states that 55% of the drivers were under 25 years. Of those, 75% were female.   Shift Workers and Travelers: People who work overnight or travel across time zones frequently are at higher risk of experiencing Circadian Rhythm Disorders. They are trying to work and function when their body is programed to sleep.   Sleep Deprived: Lack of sleep has a serious impact on your ability to pay attention or focus on a task. Consistently getting less than the average of 8 hours your body needs creates partial or cumulative sleep deprivation.   Untreated Sleep Disorders: Sleep Apnea, Narcolepsy, R.L.S., and other sleep disorders (untreated) prevent a person from getting enough restful sleep. This leads to excessive daytime sleepiness and increases the risk  for drowsy driving accidents by up to 7 times.  Medications / Alcohol: Even over the counter medications can cause drowsiness. Medications that impair a drivers attention should have a warning label. Alcohol naturally makes you sleepy and on its own can cause accidents. Combined with excessive drowsiness its effects are amplified.   Signs of Drowsy Driving:   * You don't remember driving the last few miles   * You may drift out of your lane   * You are unable to focus and your thoughts wander   * You may yawn more often than normal   * You have difficulty keeping your eyes open / nodding off   * Missing traffic signs, speeding, or tailgating  Prevention-   Good sleep hygiene, lifestyle and behavioral choices have the most impact on drowsy driving. There is no substitute for sleep and the average person requires 8 hours nightly. If you find yourself driving drowsy, stop and sleep. Consider the sleep hygiene tips provided during your visit as well.     Medication Refill Policy: Refills for all medications require 1 week advance notice. Please have your pharmacy fax a refill request. We are unable to fax, or call in "controled substance" medications and you will need to pick these prescriptions up from our office.     MyChart Activation    Thank you for requesting access to MyChart. Please follow the instructions below to securely access and download your online medical record. MyChart allows you to send messages to your doctor, view your test results, renew your prescriptions, schedule appointments, and more.    How Do I Sign Up?    1. In your internet browser, go to [email or web address].  2. Click on the First Time User? Click Here link in the Sign In box. You will see the New Member Sign Up page.  3. Enter your MyChart Access Code exactly as it appears below. You will not need to use this code after you???ve completed the sign-up process. If    you do not sign up before the expiration date, you must request a new code.    MyChart Access Code: Activation code not generated  Current MyChart Status: Active (This is the date your MyChart access code will expire)    4. Enter the last four digits of your Social Security Number (xxxx) and Date of Birth (mm/dd/yyyy) as indicated and click Submit. You will be taken to the next sign-up page.  5. Create a MyChart ID. This will be your MyChart login ID and cannot be changed, so think of one that is secure and easy to remember.  6. Create a MyChart password. You can change your password at any time.  7. Enter your Password Reset Question and Answer. This can be used at a later time if you forget your password.   8. Enter your e-mail address. You will receive e-mail notification when new information is available in MyChart.  9. Click Sign Up. You can now view and download portions of your medical record.  10. Click the Download Summary menu link to download a portable copy of your medical information.    Additional Information    If you have questions, please call [phone]. Remember, MyChart is NOT to be used for urgent needs. For medical emergencies, dial 911.

## 2018-06-15 NOTE — Progress Notes (Signed)
[address] [place] [address]  Tel.  [phone]  [place] [address]  Tel.  [phone]  Fax. [phone] [place] [address]  Tel.  [phone]  Fax. [phone]         Subjective:      Alyssa [name] is an 38 y.o. female referred for evaluation for a sleep disorder. She complains of snoring associated with feeling tired on waking.  Symptoms began 10 years ago, unchanged since that time. She usually can fall asleep in 5 minutes.  Family or house members note snoring. She denies completely or partially paralyzed while falling asleep or waking up.  Alyssa [name] does not wake up frequently at night. She is not bothered by waking up too early and left unable to get back to sleep. She actually sleeps about 6 hours at night and wakes up about 3 times during the night. She does work shifts:  Wellsite geologist.   Alyssa [name] indicates she does get too little sleep at night. Her bedtime is 2200. She awakens at 0600. She does not take naps. She has the following observed behaviors: Loud snoring, Light snoring;  .  Other remarks:  Referred by Cardiology due to (a three second pause in heart beat during sleep) on holter monitoring performed to evaluate chest pain.    Epworth Sleepiness Score: 10 which reflect mild daytime drowsiness.    Allergies   Allergen Reactions   ??? Betadine [Povidone-Iodine] Hives and Rash   ??? Codeine Nausea and Vomiting   ??? [place] Hives   ??? Sulfa (Sulfonamide Antibiotics) Rash   ??? Tramadol Itching         Current Outpatient Medications:   ???  glipiZIDE (GLUCOTROL) 5 mg tablet, Take 1 Tab by mouth two (2) times a day., Disp: 180 Tab, Rfl: 1  ???  rosuvastatin (CRESTOR) 10 mg tablet, Take 1 Tab by mouth nightly., Disp: 90 Tab, Rfl: 0  ???  flash glucose sensor (FREESTYLE LIBRE 14 DAY SENSOR) kit, Change sensor every 14 days per package instructions E 11.65, Disp: 6 Kit, Rfl: 3   ???  lisinopril (PRINIVIL, ZESTRIL) 10 mg tablet, TAKE 1 TABLET BY MOUTH EVERY DAY, Disp: 30 Tab, Rfl: 2  ???  metFORMIN ER (GLUCOPHAGE XR) 500 mg tablet, TAKE 2 TABLETS BY MOUTH DAILY WITH DINNER, Disp: 60 Tab, Rfl: 2  ???  TRUE METRIX GLUCOSE METER misc, USE TO CHECK BLOOD SUGAR 1 TO 3 TIMES A DAY AS DIRECTED, Disp: 1 Each, Rfl: 0  ???  lancets misc, Blood sugar 1-3 times daily as directed by physician E 11.9, Disp: 100 Each, Rfl: 11  ???  TRUE METRIX GLUCOSE TEST STRIP strip, Test Blood sugars 1-3 times daily or as directed., Disp: 100 Strip, Rfl: 1  ???  miconazole (MICOTIN) 2 % topical cream, Apply  to affected area two (2) times a day., Disp: 15 g, Rfl: 0  ???  fluticasone propionate (FLONASE) 50 mcg/actuation nasal spray, 2 Sprays by Both Nostrils route daily. Indications: PND, Disp: 1 Bottle, Rfl: 5  ???  diclofenac EC (VOLTAREN) 75 mg EC tablet, Take 1 Tab by mouth two (2) times a day., Disp: 30 Tab, Rfl: 0  ???  cyclobenzaprine (FLEXERIL) 10 mg tablet, Take 1 Tab by mouth three (3) times daily as needed for Muscle Spasm(s)., Disp: 20 Tab, Rfl: 0  ???  insulin glargine (LANTUS SOLOSTAR U-100 INSULIN) 100 unit/mL (3 mL) inpn, 74  Units Every Bedtime  Indications: type 2 diabetes mellitus, Disp: 5 Adjustable Dose Pre-filled Pen Syringe, Rfl: 1  ???  acetaminophen (TYLENOL ARTHRITIS PAIN) 650 mg TbER, Take 650 mg by mouth every eight (8) hours., Disp: , Rfl:   ???  Blood-Glucose Meter monitoring kit, Check blood sugar 1-3 times daily as directed by physician E 11.9, Disp: 1 Kit, Rfl: 0  ???  Insulin Needles, Disposable, 31 gauge x 5/16" ndle, Use with Lantus Solostar pen, Disp: 1 Package, Rfl: 11  ???  valACYclovir (VALTREX) 1 gram tablet, Take 1 Tab by mouth two (2) times a day., Disp: 28 Tab, Rfl: 0  ???  lidocaine (LIDODERM) 5 %, Apply patch to the affected area for 12 hours a day and remove for 12 hours a day., Disp: 15 Each, Rfl: 0     She  has a past medical history of Anemia, Arthritis, Asthma (2011, 2014),  Chronic pain, Diabetes ([place]) (2012), Endometriosis, GERD (gastroesophageal reflux disease) (2013), Hypertension (2007), Morbid obesity ([place]) (2008), Ovarian cyst, and PUD (peptic ulcer disease).    She  has a past surgical history that includes hx gi (2013); hx colectomy (2013); hx colectomy (05/01/2011); pr abdomen surgery proc unlisted (05/01/2011); hx gi (05/01/2011); hx orthopaedic; hx carpal tunnel release (Right); hx urological (03/21/2016); hx urological (03/21/2016); hx cesarean section; hx tubal ligation (2011); hx appendectomy (11/2002); hx hernia repair (11/16/2016); hx hysteroscopy with endometrial ablation (11/2015); hx laparoscopic supracervical hysterectomy (03/23/2016); and hx right??salpingo-oophorectomy.    She family history includes Breast Cancer in an other family member; Cancer in her maternal grandmother, paternal grandfather, and paternal grandmother; Diabetes in her brother and maternal aunt; Hypertension in her brother, father, and mother; Psychiatric Disorder in her maternal grandmother; Stroke in her father.    She  reports that she quit smoking about 9 years ago. She has a 9.00 pack-year smoking history. She has never used smokeless tobacco. She reports that she does not drink alcohol or use drugs.     Review of Systems:  Constitutional:  No significant weight loss or weight gain  Eyes:  No blurred vision  CVS:  No significant chest pain  Pulm:  No significant shortness of breath  GI:  No significant nausea or vomiting  GU:  + significant nocturia  Musculoskeletal:  No significant joint pain at night  Skin:  No significant rashes  Neuro:  No significant dizziness   Psych:  No active mood issues    Sleep Review of Systems: notable for no difficulty falling asleep; frequent awakenings at night;  regular dreaming noted; no nightmares ; no early morning headaches; no memory problems; no concentration issues; no history of any automobile or occupational accidents due to daytime drowsiness.       Objective:     Visit Vitals  BP 114/71 (BP 1 Location: Left arm, BP Patient Position: Sitting)   Pulse 95   Temp 96.2 ??F (35.7 ??C) (Oral)   Ht 5' 3" (1.6 m)   Wt 260 lb (117.9 kg) Comment: patient reported   LMP 03/08/2016   SpO2 98%   BMI 46.06 kg/m??         General:   Not in acute distress   Eyes:  Anicteric sclerae, no obvious strabismus   Nose:  No obvious nasal septum deviation    Oropharynx:   Class 4 oropharyngeal outlet, thick tongue base, uvula could not be seen due to low-lying soft palate, narrow tonsilo-pharyngeal pilars   Tonsils:   tonsils are not seen  due to low-lying soft palate   Neck:   Neck circ. in "inches": 16.5; midline trachea   Chest/Lungs:  Equal lung expansion, clear on auscultation    CVS:  Normal rate, regular rhythm; no JVD   Skin:  Warm to touch; no obvious rashes   Neuro:  No focal deficits ; no obvious tremor    Psych:  Normal affect,  normal countenance;          Assessment:       ICD-10-CM ICD-9-CM    1. OSA (obstructive sleep apnea) G47.33 327.23 POLYSOMNOGRAPHY 1 NIGHT   2. Essential hypertension I10 401.9    3. BMI 45.0-49.9, adult [facility]) Z68.42 V85.42          Plan:     * The patient currently has a Moderate Risk for having sleep apnea.  STOP-BANG score 5.  * Sleep testing was ordered for initial evaluation.    * She was provided information on sleep apnea including coresponding risk factors and the importance of proper treatment.   * Treatment options if indicated were reviewed today. Patient agrees to a trial of PAP therapy if indicated.  * Counseling was provided regarding proper sleep hygiene (including effect of light on sleep) and safe driving.  * Effect of sleep disturbance on weight was reviewed. We have recommended a dedicated weight loss through appropriate diet and an exercise regiment as significant weight reduction has been shown to reduce severity of obstructive sleep apnea.     * Patient agrees to telephone [phone]  follow-up by myself or lead  sleep technologist shortly after sleep study to review results and plan final management.     (patient has given permission for a message to be left regarding test results and further management if patient cannot be cannot be reached directly).      Thank you for allowing [place] to participate in your patient's medical care.  We'll keep you updated on these investigations.    Cam Hai, MD, FAASM  Electronically signed. 06/15/18

## 2018-06-15 NOTE — Progress Notes (Signed)
Progress  Notes by Alyssa Hai, MD at 06/15/18 0900                Author: Cam Hai, MD  Service: --  Author Type: Physician       Filed: 06/15/18 0954  Encounter Date: 06/15/2018  Status: Signed          Editor: Alyssa Hai, MD (Physician)                                          [address] [place] [address]   Tel.  [phone]   [place] [address]   Tel.  [phone]   Fax. [phone]  [place] [address]   Tel.  [phone]   Fax. [phone]                Subjective:         Alyssa [name] is an 38 y.o.  female referred for evaluation for a sleep disorder. She  complains of snoring associated with feeling tired on waking.  Symptoms began 10 years ago, unchanged since that time. She usually can fall  asleep in 5 minutes.  Family or house members note snoring. She denies completely or partially paralyzed while falling asleep or waking up.   Alyssa [name] does not  wake up frequently at night. She is not  bothered by waking up too early and left unable to get back to sleep. She actually sleeps about  6 hours at night and wakes up about 3 times during the night.  She does work shifts:  Wellsite geologist .    [name] indicates she  does get too little sleep at night. Her  bedtime is 2200. She  awakens at 0600.  She does not take naps.  She has the following observed behaviors: Loud snoring, Light snoring;   .   Other remarks:  Referred by Cardiology due to (a three second pause in heart beat during sleep) on holter monitoring performed to evaluate chest  pain.      Epworth Sleepiness Score: 10 which reflect mild  daytime drowsiness.        Allergies        Allergen  Reactions         ?  Betadine [Povidone-Iodine]  Hives and Rash     ?  Codeine  Nausea and Vomiting     ?  Alyssa [name]  Hives     ?  Sulfa (Sulfonamide Antibiotics)  Rash         ?  Tramadol  Itching              Current Outpatient  Medications:    ?  glipiZIDE (GLUCOTROL) 5 mg tablet, Take 1 Tab by mouth two (2) times a day., Disp: 180 Tab, Rfl: 1   ?  rosuvastatin (CRESTOR) 10 mg tablet, Take 1 Tab by mouth nightly., Disp: 90 Tab, Rfl: 0   ?  flash glucose sensor (FREESTYLE LIBRE 14 DAY SENSOR) kit, Change sensor every 14 days per package instructions E 11.65, Disp: 6 Kit, Rfl: 3   ?  lisinopril (PRINIVIL, ZESTRIL) 10 mg tablet, TAKE 1 TABLET BY MOUTH EVERY DAY, Disp: 30 Tab, Rfl: 2   ?  metFORMIN ER (GLUCOPHAGE XR) 500  mg tablet, TAKE 2 TABLETS BY MOUTH DAILY WITH DINNER, Disp: 60 Tab, Rfl: 2   ?  TRUE METRIX GLUCOSE METER misc, USE TO CHECK BLOOD SUGAR 1 TO 3 TIMES A DAY AS DIRECTED, Disp: 1 Each, Rfl: 0   ?  lancets misc, Blood sugar 1-3 times daily as directed by physician E 11.9, Disp: 100 Each, Rfl: 11   ?  TRUE METRIX GLUCOSE TEST STRIP strip, Test Blood sugars 1-3 times daily or as directed., Disp: 100 Strip, Rfl: 1   ?  miconazole (MICOTIN) 2 % topical cream, Apply  to affected area two (2) times a day., Disp: 15 g, Rfl: 0   ?  fluticasone propionate (FLONASE) 50 mcg/actuation nasal spray, 2 Sprays by Both Nostrils route daily. Indications: PND, Disp: 1 Bottle, Rfl: 5   ?  diclofenac EC (VOLTAREN) 75 mg EC tablet, Take 1 Tab by mouth two (2) times a day., Disp: 30 Tab, Rfl: 0   ?  cyclobenzaprine (FLEXERIL) 10 mg tablet, Take 1 Tab by mouth three (3) times daily as needed for Muscle Spasm(s)., Disp: 20 Tab, Rfl: 0   ?  insulin glargine (LANTUS SOLOSTAR U-100 INSULIN) 100 unit/mL (3 mL) inpn, 74 Units Every Bedtime  Indications: type 2 diabetes mellitus, Disp: 5 Adjustable Dose Pre-filled Pen Syringe, Rfl: 1   ?  acetaminophen (TYLENOL ARTHRITIS PAIN) 650 mg TbER, Take 650 mg by mouth every eight (8) hours., Disp: , Rfl:    ?  Blood-Glucose Meter monitoring kit, Check blood sugar 1-3 times daily as directed by physician E 11.9, Disp: 1 Kit, Rfl: 0   ?  Insulin Needles, Disposable, 31 gauge x 5/16" ndle, Use with Lantus Solostar pen,  Disp: 1 Package, Rfl: 11   ?  valACYclovir (VALTREX) 1 gram tablet, Take 1 Tab by mouth two (2) times a day., Disp: 28 Tab, Rfl: 0   ?  lidocaine (LIDODERM) 5 %, Apply patch to the affected area for 12 hours a day and remove for 12 hours a day., Disp: 15 Each, Rfl: 0       She  has a past medical history of Anemia,  Arthritis, Asthma (2011, 2014), Chronic pain, Diabetes ([place]) (2012), Endometriosis, GERD (gastroesophageal reflux disease) (2013), Hypertension (2007), Morbid obesity ([place]) (2008), Ovarian cyst, and PUD (peptic ulcer disease).      She  has a past surgical history that includes hx gi (2013); hx colectomy (2013); hx  colectomy (05/01/2011); pr abdomen surgery proc unlisted (05/01/2011); hx gi (05/01/2011); hx orthopaedic; hx carpal tunnel release (Right); hx urological (03/21/2016); hx urological (03/21/2016); hx cesarean section; hx tubal ligation (2011); hx appendectomy  (11/2002); hx hernia repair (11/16/2016); hx hysteroscopy with endometrial ablation (11/2015); hx laparoscopic supracervical hysterectomy (03/23/2016); and hx right??salpingo-oophorectomy.      She family history includes Breast Cancer in an other family member; Cancer in her maternal  grandmother, paternal grandfather, and paternal grandmother; Diabetes in her brother and maternal aunt; Hypertension in her brother, father, and mother; Psychiatric Disorder in her maternal grandmother; Stroke in her father.      She  reports that she quit smoking about 9 years ago. She has a 9.00 pack-year smoking  history. She has never used smokeless tobacco. She reports that she does not drink alcohol or use drugs.       Review of Systems:   Constitutional:  No significant weight loss or weight gain   Eyes:  No blurred vision   CVS:  No significant chest pain  Pulm:  No significant shortness of breath   GI:  No significant nausea or vomiting   GU:  + significant nocturia   Musculoskeletal:  No significant joint pain at night   Skin:  No significant  rashes   Neuro:  No significant dizziness    Psych:  No active mood issues      Sleep Review of Systems: notable for no difficulty falling asleep; frequent awakenings at night;  regular dreaming noted;  no nightmares ; no early morning headaches; no memory problems; no concentration issues; no history of any automobile or occupational accidents due to daytime drowsiness.           Objective:        Visit Vitals      BP  114/71 (BP 1 Location: Left arm, BP Patient Position: Sitting)     Pulse  95     Temp  96.2 ??F (35.7 ??C) (Oral)     Ht  '5\' 3"'  (1.6 m)         Wt  260 lb (117.9 kg)  Comment: patient reported        LMP  03/08/2016     SpO2  98%        BMI  46.06 kg/m??                 General:    Not in acute distress     Eyes:   Anicteric sclerae, no obvious strabismus     Nose:   No obvious nasal septum deviation      Oropharynx:    Class 4 oropharyngeal outlet, thick tongue base, uvula could not be seen due to low-lying soft palate, narrow tonsilo-pharyngeal pilars     Tonsils:    tonsils are not seen due to low-lying soft palate     Neck:    Neck circ. in "inches": 16.5; midline trachea        Chest/Lungs:   Equal lung expansion, clear on auscultation      CVS:   Normal rate, regular rhythm; no JVD     Skin:   Warm to touch; no obvious rashes     Neuro:   No focal deficits ; no obvious tremor      Psych:   Normal affect,  normal countenance;                Assessment:                  ICD-10-CM  ICD-9-CM             1.  OSA (obstructive sleep apnea)  G47.33  327.23  POLYSOMNOGRAPHY 1 NIGHT     2.  Essential hypertension  I10  401.9             3.  BMI 45.0-49.9, adult [facility])  Z68.42  V85.42                  Plan:        * The patient currently has a Moderate Risk for having sleep apnea.  STOP-BANG score 5.   * Sleep testing was ordered for initial evaluation.     * She was provided information on sleep apnea including coresponding risk factors and the importance of proper treatment.    * Treatment options if  indicated were reviewed today. Patient agrees to a trial of PAP therapy if indicated.   * Counseling was provided regarding proper sleep hygiene (including effect of  light on sleep) and safe driving.   * Effect of sleep disturbance on weight was reviewed. We have recommended a dedicated weight loss through appropriate diet and an exercise regiment as significant weight reduction has been shown to reduce severity of obstructive sleep apnea.       * Patient agrees to telephone [phone]  follow-up by myself or lead sleep technologist shortly after sleep study to review results and plan final management.       (patient has given permission for a message to be left regarding test results and further management if patient cannot be cannot be reached directly).         Thank you for allowing [place] to participate in your patient's medical care.  We'll keep you updated on these investigations.      Alyssa Hai, MD, FAASM   Electronically signed. 06/15/18

## 2018-06-20 NOTE — Progress Notes (Signed)
Attempt to reach patient for follow up. Discreet VM left with contact information. Will try back in two weeks if I don't hear from her, 3/18

## 2018-07-05 NOTE — Progress Notes (Signed)
Attempt to reach patient for follow up. Discreet VM left with contact information. To send lost to follow up letter.  Patient has follow up appointments scheduled.

## 2018-07-11 NOTE — Telephone Encounter (Signed)
Pt has a DM follow up appt with Dr Aleene Davidson on 07/26/18 and is asking if she will be getting lab ordered? If so, can she have them done prior to the visit?  Please call [phone]

## 2018-07-11 NOTE — Telephone Encounter (Signed)
Too soon for follow up labs  Offer patient as virtual visit

## 2018-07-11 NOTE — Telephone Encounter (Signed)
Called pt and left a voice message, asking that she call the office back when able in regards to her appointment.

## 2018-07-12 ENCOUNTER — Telehealth

## 2018-07-12 NOTE — Telephone Encounter (Signed)
HSAT or reschedule

## 2018-07-17 NOTE — Telephone Encounter (Signed)
LMVM TO CANCEL SLEEP STUDY SCHEDULED ON 07/27/18 PER CDC GUIDELINES

## 2018-07-19 NOTE — Progress Notes (Signed)
Resolving current episode of case management.  Patient has met patient stated and/or medical goals.      Patient consistenly demonstrates understanding of the medical action plan through execution of plan. Appointments with key providers are scheduled and attended. Plan of care is modified and updated to address new challenges and barriers with minimal support from the CM team(proactively uses physicians and community resources) The support system remains current and has been modified as needed.Patient continues to acquire needed resources from the current support system established. Teach back demonstrates that patient understands education for self management of chronic conditions. Patient consistenly communicates understanding of signs,symptoms and complications for all major diagnoses.   Patient modifies his/her lifestyle toreduce or avoid risk factors to his/her health.     ECM will follow as needed and patient has ECM contact information for future needs.

## 2018-07-25 ENCOUNTER — Encounter: Payer: MEDICAID | Attending: Registered" | Primary: Family Medicine

## 2018-07-25 NOTE — Telephone Encounter (Signed)
Scheduled

## 2018-07-25 NOTE — Telephone Encounter (Signed)
*   Due to the current COVID-19 outbreak I will order an HSAT in an effort to expedite care.    Orders Placed This Encounter   ??? SLEEP STUDY UNATTENDED, 4 CHANNEL     Order Specific Question:   Reason for Exam     Answer:   osa

## 2018-07-25 NOTE — Addendum Note (Signed)
Addended by: Cam Hai on: 07/25/2018 01:10 PM     Modules accepted: Orders

## 2018-07-25 NOTE — Addendum Note (Signed)
Addendum  Note by Cam Hai, MD at 07/25/18 1310                Author: Cam Hai, MD  Service: --  Author Type: Physician       Filed: 07/25/18 1310  Encounter Date: 07/12/2018  Status: Signed          Editor: Cam Hai, MD (Physician)          Addended by: Cam Hai on: 07/25/2018 01:10 PM    Modules accepted: Orders

## 2018-07-26 ENCOUNTER — Telehealth: Attending: Family Medicine | Primary: Family Medicine

## 2018-07-26 ENCOUNTER — Encounter: Attending: Family Medicine | Primary: Family Medicine

## 2018-07-26 ENCOUNTER — Encounter: Payer: MEDICAID | Attending: Registered" | Primary: Family Medicine

## 2018-07-26 ENCOUNTER — Telehealth
Admit: 2018-07-26 | Discharge: 2018-07-26 | Payer: PRIVATE HEALTH INSURANCE | Attending: Family Medicine | Primary: Family Medicine

## 2018-07-26 DIAGNOSIS — E1165 Type 2 diabetes mellitus with hyperglycemia: Secondary | ICD-10-CM
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
## 2018-07-27 ENCOUNTER — Ambulatory Visit: Primary: Family Medicine

## 2018-07-27 ENCOUNTER — Ambulatory Visit: Admit: 2018-07-27 | Discharge: 2018-07-27 | Payer: PRIVATE HEALTH INSURANCE | Primary: Family Medicine

## 2018-07-27 ENCOUNTER — Ambulatory Visit: Payer: MEDICAID | Primary: Family Medicine

## 2018-07-27 DIAGNOSIS — G4733 Obstructive sleep apnea (adult) (pediatric): Secondary | ICD-10-CM

## 2018-07-27 NOTE — Progress Notes (Signed)
[address] [place] [address]  Tel.  [phone]  Fax. [place] [address]  Tel.  [phone]  Fax. [phone] [place] [address]  Tel.  [phone]  Fax. [phone]       S>Alyssa [name] is a 38 y.o. female seen today to receive a home sleep testing unit (HST).    ?? Patient was educated on proper hookup and operation of the HST.  ?? Instruction forms and documentation were reviewed and signed.  ?? The patient demonstrated good understanding of the HST.    O>    Visit Vitals  LMP 03/08/2016         A>  No diagnosis found.      P>  ?? General information regarding operations and maintenance of the device was provided.  ?? She was provided information on sleep apnea including coresponding risk factors and the importance of proper treatment.   ?? Follow-up appointment was made to return the HST. She will be contacted once the results have been reviewed.  ?? She was asked to contact our office for any problems regarding her home sleep test study.

## 2018-07-27 NOTE — Progress Notes (Signed)
Downloaded device, two attempts made on 07-27-2018; 26 minutes & 07-28-2018, 30 minutes.  Ms Truax was re-issued the device for another attempt.

## 2018-07-27 NOTE — Progress Notes (Signed)
 Progress Notes  by Jodi Saunas at 07/27/18 1030                Author: Jodi Saunas  Service: --  Author Type: Technologist       Filed: 07/27/18 1207  Encounter Date: 07/27/2018  Status: Signed          Editor: Jodi Saunas (Technologist)                                            [address] [place], [place] [address]   Tel.  [phone]   Fax. [phone]  [address]   [place], [place] [address]   Tel.  [phone]   Fax. [phone]  [address]   [place], [place] [address]   Tel.  [phone]   Fax. [phone]           S>Alyssa [name] is a  38 y.o. female seen today to receive a home sleep testing unit (HST).        Patient was educated on proper hookup and operation of the HST.     Instruction forms and documentation were reviewed and signed.     The patient demonstrated good understanding of the HST.      O>     Visit Vitals      LMP  03/08/2016             A>   No diagnosis found.         P>     General information regarding operations and maintenance of the device was provided.     She was provided information on sleep apnea including coresponding risk factors and the importance of proper treatment.      Follow-up appointment was made to return the HST. She will be contacted once the results have been reviewed.     She was asked to contact our office for any problems regarding her  home sleep test study.

## 2018-07-27 NOTE — Progress Notes (Signed)
Downloaded device, two attempts made on 07-27-2018; 26 minutes & 07-28-2018, 30 minutes.  Alyssa [name] was re-issued the device for another attempt.

## 2018-08-02 ENCOUNTER — Inpatient Hospital Stay: Admit: 2018-08-17 | Payer: PRIVATE HEALTH INSURANCE | Primary: Family Medicine

## 2018-08-02 DIAGNOSIS — G4733 Obstructive sleep apnea (adult) (pediatric): Secondary | ICD-10-CM

## 2018-08-03 ENCOUNTER — Encounter

## 2018-08-03 MED ORDER — TRUE METRIX GLUCOSE TEST STRIP
ORAL_STRIP | 1 refills | Status: DC
Start: 2018-08-03 — End: 2019-10-09

## 2018-08-13 NOTE — Telephone Encounter (Signed)
Left message for the patient to call back and return the HSAT device.

## 2018-08-16 NOTE — Telephone Encounter (Signed)
Spoke with [name], she stated that she would drop off her HSAT on the morning of 08-17-2018

## 2018-08-17 ENCOUNTER — Telehealth

## 2018-08-17 NOTE — Telephone Encounter (Signed)
Pt called to advise Dr. Aleene Davidson she had to call EMS to her home yesterday due to episode of low blood sugar, noting she woke up feeling shaky, but was unable to get reading other than Lo on her glucometer, ate orange and readings went up from 40's, 70's, up to 180 within an hour.    Pt wants to discuss changing her diabetic medications, stating she's changed her diet, limiting carbs and increasing protein.    Pt scheduled for virtual visit with Dr. Aleene Davidson for Monday at 1:15 pm.    Please call if further instructions for pt. Ldm

## 2018-08-17 NOTE — Telephone Encounter (Signed)
Results of Sleep Testing reviewed with patient who agrees to a trial of APAP therapy. PAP prescription and follow-up discussed with patient. Patient encouraged to call if there were any further questions regarding sleep symptoms.    Encounter Diagnosis   Name Primary?   ??? Obstructive sleep apnea (adult) (pediatric) Yes       Orders Placed This Encounter   ??? AMB SUPPLY ORDER     Diagnosis: Obstructive Sleep Apnea ICD-10 Code (G47.33)    Positive Airway Pressure Therapy: Duration of need: 99 months.      ResMed APAP Device with Heated Humidifer [id number] / [id number].   Minimum Pressure: 4 cmH2O, Maximum Pressure: 20 cmH2O.      G8185 Nasal Cushion (Replace) 2 per month.    U3149 Nasal Interface Mask 1 every 3 months.  F0263 Headgear 1 every 6 months.    Z8588 Filter(s) Disposable 2 per month.  F0277 Filter(s) Non-Disposable 1 every 6 months.     [place] for [employer] (Replace) 1 every 6 months.  A1287 Tubing with heating element 1 every 3 months.    Perform Mask Fitting per patient preference and comfort - replace as above.    Cam Hai, MD, FAASM; NPI: [id number]  Electronically signed.08/17/18

## 2018-08-20 ENCOUNTER — Telehealth: Attending: Family Medicine | Primary: Family Medicine

## 2018-08-20 ENCOUNTER — Telehealth
Admit: 2018-08-20 | Discharge: 2018-08-20 | Payer: PRIVATE HEALTH INSURANCE | Attending: Family Medicine | Primary: Family Medicine

## 2018-08-20 DIAGNOSIS — E118 Type 2 diabetes mellitus with unspecified complications: Secondary | ICD-10-CM

## 2018-08-20 NOTE — Progress Notes (Signed)
Alyssa [name] is a 38 y.o. female who was seen by synchronous (real-time) audio-video technology on 08/20/2018.      Consent: Alyssa [name], who was seen by synchronous (real-time) audio-video technology, and/or her healthcare decision maker, is aware that this patient-initiated, Telehealth encounter on 08/20/2018 is a billable service, with coverage as determined by her insurance carrier. She is aware that she may receive a bill and has provided verbal consent to proceed: Yes.      Assessment & Plan:   Diagnoses and all orders for this visit:    1. Type 2 diabetes mellitus with complication, with long-term current use of insulin ([place])    2. Hypoglycemia    overnight hypoglycemia  STOP GLIPIZIDE  Keep using freestyle libre  Does have symptoms  Average bg is less than 1/2 what it was 3 m ago, probably over-correcting now with dietary and lifestyle changes, option to decr insulin if needed too  Labs in 3  Wk then f/u appt  Asked pt to message averages after next week to let me know how its going  Advised her to get a sugar source for home and work to keep on her bedside and in her pocket (fruit snack packs, mini juice boxes for example or glucose tabs)  Call with concerns, seek care if low that can't increase with home measures.    Pt was counseled on risks, benefits and alternatives of treatment options. All questions were asked and answered and the patient was agreeable with the treatment plan as outlined.      Time documented includes face to face time, documentation and chart review if applicable except as noted.  Subjective:   Alyssa [name] is a 38 y.o. female who was seen for Blood sugar problem (hypoglycemic- started running low last week- was  150 after breakfast )  Doing ok today  She repquested this appt because she had an episode of hypoglycemia, she reports it wouldn't register on her Elenor Legato and it ran "LO" for an hour  She felt shakey and a sense of impending doom or like she would pass out   She got a few orange slices and she came up to 47 then 70 then eventually over 150  Her aunt called EMS whens he was low, but by the time they got there she had come up to 220 and she was feeling less symptomatic.   She refused to go to the ER    This is the first time she's had a profoundly low episode of hypoglycemia. In the past she did have one that was hypoglycemic in the past    She reports that her highs have been around 150 or 155 and her lows are running around 70-100s    Medications, allergies, PMH, PSH, SOCH, [facility] reviewed and updated per routine protocol, see chart for review and changes if not noted here.    ROS  A 12 point review of systems was negative except as noted here or in the HPI.    Objective:   Vital Signs: (As obtained by patient/caregiver at home)  Visit Vitals  Ht 5\' 3"  (1.6 m)   Wt 243 lb (110.2 kg)   LMP 03/08/2016   BMI 43.05 kg/m??        [INSTRUCTIONS:  "[x] " Indicates a positive item  "[] " Indicates a negative item  -- DELETE ALL ITEMS NOT EXAMINED]    Constitutional: [x]  Appears well-developed and well-nourished [x]  No apparent distress      []  Abnormal -  Mental status: [x]  Alert and awake  [x]  Oriented to person/place/time [x]  Able to follow commands    []  Abnormal -     Eyes:   EOM    [x]   Normal    []  Abnormal -   Sclera  [x]   Normal    []  Abnormal -          Discharge [x]   None visible   []  Abnormal -     HENT: [x]  Normocephalic, atraumatic  []  Abnormal -   [x]  Mouth/Throat: Mucous membranes are moist    External Ears [x]  Normal  []  Abnormal -    Neck: [x]  No visualized mass []  Abnormal -     Pulmonary/Chest: [x]  Respiratory effort normal   [x]  No visualized signs of difficulty breathing or respiratory distress        []  Abnormal -      Musculoskeletal:   [x]  Normal gait with no signs of ataxia         [x]  Normal range of motion of neck        []  Abnormal -     Neurological:        [x]  No Facial Asymmetry (Cranial nerve 7 motor function) (limited exam due to video visit)           [x]  No gaze palsy        []  Abnormal -          Skin:        [x]  No significant exanthematous lesions or discoloration noted on facial skin         []  Abnormal -            Psychiatric:       [x]  Normal Affect []  Abnormal -        [x]  No Hallucinations    Other pertinent observable physical exam findings:none obvious    We discussed the expected course, resolution and complications of the diagnosis(es) in detail.  Medication risks, benefits, costs, interactions, and alternatives were discussed as indicated.  I advised her to contact the office if her condition worsens, changes or fails to improve as anticipated. She expressed understanding with the diagnosis(es) and plan.       Alyssa [name] is a 38 y.o. female who was evaluated by a video visit encounter for concerns as above. Patient identification was verified prior to start of the visit. A caregiver was present when appropriate. Due to this being a Scientist, physiological (During XNATF-57 public health emergency), evaluation of the following organ systems was limited: Vitals/Constitutional/EENT/Resp/CV/GI/GU/MS/Neuro/Skin/Heme-Lymph-Imm.  Pursuant to the emergency declaration under the [place], 1135 waiver authority and the [employer] Act, this Virtual  Visit was conducted, with patient's (and/or legal guardian's) consent, to reduce the patient's risk of exposure to COVID-19 and provide necessary medical care.     Services were provided through a video synchronous discussion virtually to substitute for in-person clinic visit.   Patient and provider were located at their individual homes.      Doneen Poisson, MD  [facility]  08/20/18 1:13 PM     Portions of this note may have been populated using smart dictation software and may have "sounds-like" errors present.

## 2018-08-20 NOTE — Progress Notes (Signed)
Chief Complaint   Patient presents with   ??? Blood sugar problem     hypoglycemic- started running low last week- was  150 after breakfast      Pt is having virtual appointment at work in [place], [place].

## 2018-08-20 NOTE — Telephone Encounter (Signed)
Noted, appt scheduled for today

## 2018-08-20 NOTE — Progress Notes (Signed)
Chief Complaint   Patient presents with   . Blood sugar problem     hypoglycemic- started running low last week- was  150 after breakfast      Pt is having virtual appointment at work in [place], [place].

## 2018-08-20 NOTE — Progress Notes (Signed)
Alyssa [name] is a 38 y.o. female who was seen by synchronous (real-time) audio-video technology on 08/20/2018.      Consent: Alyssa [name], who was seen by synchronous (real-time) audio-video technology, and/or her healthcare decision maker, is aware that this patient-initiated, Telehealth encounter on 08/20/2018 is a billable service, with coverage as determined by her insurance carrier. She is aware that she may receive a bill and has provided verbal consent to proceed: Yes.      Assessment & Plan:   Diagnoses and all orders for this visit:    1. Type 2 diabetes mellitus with complication, with long-term current use of insulin ([place])    2. Hypoglycemia    overnight hypoglycemia  STOP GLIPIZIDE  Keep using freestyle libre  Does have symptoms  Average bg is less than 1/2 what it was 3 m ago, probably over-correcting now with dietary and lifestyle changes, option to decr insulin if needed too  Labs in 3  Wk then f/u appt  Asked pt to message averages after next week to let me know how its going  Advised her to get a sugar source for home and work to keep on her bedside and in her pocket (fruit snack packs, mini juice boxes for example or glucose tabs)  Call with concerns, seek care if low that can't increase with home measures.    Pt was counseled on risks, benefits and alternatives of treatment options. All questions were asked and answered and the patient was agreeable with the treatment plan as outlined.      Time documented includes face to face time, documentation and chart review if applicable except as noted.  Subjective:   Alyssa [name] is a 38 y.o. female who was seen for Blood sugar problem (hypoglycemic- started running low last week- was  150 after breakfast )  Doing ok today  She repquested this appt because she had an episode of hypoglycemia, she reports it wouldn't register on her Elenor Legato and it ran "LO" for an hour  She felt shakey and a sense of impending doom or like she would pass out  She got a few  orange slices and she came up to 40 then 61 then eventually over 150  Her aunt called EMS whens he was low, but by the time they got there she had come up to 220 and she was feeling less symptomatic.   She refused to go to the ER    This is the first time she's had a profoundly low episode of hypoglycemia. In the past she did have one that was hypoglycemic in the past    She reports that her highs have been around 150 or 155 and her lows are running around 70-100s    Medications, allergies, PMH, PSH, SOCH, [facility] reviewed and updated per routine protocol, see chart for review and changes if not noted here.    ROS  A 12 point review of systems was negative except as noted here or in the HPI.    Objective:   Vital Signs: (As obtained by patient/caregiver at home)  Visit Vitals  Ht 5\' 3"  (1.6 m)   Wt 243 lb (110.2 kg)   LMP 03/08/2016   BMI 43.05 kg/m??        [INSTRUCTIONS:  "[x] " Indicates a positive item  "[] " Indicates a negative item  -- DELETE ALL ITEMS NOT EXAMINED]    Constitutional: [x]  Appears well-developed and well-nourished [x]  No apparent distress      []  Abnormal -  Mental status: [x]  Alert and awake  [x]  Oriented to person/place/time [x]  Able to follow commands    []  Abnormal -     Eyes:   EOM    [x]   Normal    []  Abnormal -   Sclera  [x]   Normal    []  Abnormal -          Discharge [x]   None visible   []  Abnormal -     HENT: [x]  Normocephalic, atraumatic  []  Abnormal -   [x]  Mouth/Throat: Mucous membranes are moist    External Ears [x]  Normal  []  Abnormal -    Neck: [x]  No visualized mass []  Abnormal -     Pulmonary/Chest: [x]  Respiratory effort normal   [x]  No visualized signs of difficulty breathing or respiratory distress        []  Abnormal -      Musculoskeletal:   [x]  Normal gait with no signs of ataxia         [x]  Normal range of motion of neck        []  Abnormal -     Neurological:        [x]  No Facial Asymmetry (Cranial nerve 7 motor function) (limited exam due to video visit)          [x]  No  gaze palsy        []  Abnormal -          Skin:        [x]  No significant exanthematous lesions or discoloration noted on facial skin         []  Abnormal -            Psychiatric:       [x]  Normal Affect []  Abnormal -        [x]  No Hallucinations    Other pertinent observable physical exam findings:none obvious    We discussed the expected course, resolution and complications of the diagnosis(es) in detail.  Medication risks, benefits, costs, interactions, and alternatives were discussed as indicated.  I advised her to contact the office if her condition worsens, changes or fails to improve as anticipated. She expressed understanding with the diagnosis(es) and plan.       Alyssa [name] is a 38 y.o. female who was evaluated by a video visit encounter for concerns as above. Patient identification was verified prior to start of the visit. A caregiver was present when appropriate. Due to this being a Scientist, physiological (During CHENI-77 public health emergency), evaluation of the following organ systems was limited: Vitals/Constitutional/EENT/Resp/CV/GI/GU/MS/Neuro/Skin/Heme-Lymph-Imm.  Pursuant to the emergency declaration under the [place], 1135 waiver authority and the [employer] Act, this Virtual  Visit was conducted, with patient's (and/or legal guardian's) consent, to reduce the patient's risk of exposure to COVID-19 and provide necessary medical care.     Services were provided through a video synchronous discussion virtually to substitute for in-person clinic visit.   Patient and provider were located at their individual homes.      Doneen Poisson, MD  [facility]  08/20/18 1:13 PM     Portions of this note may have been populated using smart dictation software and may have "sounds-like" errors present.

## 2018-08-22 MED ORDER — LANTUS SOLOSTAR U-100 INSULIN 100 UNIT/ML (3 ML) SUBCUTANEOUS PEN
100 unit/mL (3 mL) | SUBCUTANEOUS | 5 refills | Status: DC
Start: 2018-08-22 — End: 2019-01-29

## 2018-08-29 ENCOUNTER — Telehealth: Primary: Family Medicine

## 2018-08-29 ENCOUNTER — Telehealth: Admit: 2018-08-29 | Discharge: 2018-08-29 | Payer: PRIVATE HEALTH INSURANCE | Primary: Family Medicine

## 2018-08-29 DIAGNOSIS — E1165 Type 2 diabetes mellitus with hyperglycemia: Secondary | ICD-10-CM

## 2018-08-29 NOTE — Progress Notes (Signed)
[employer] [facility]  Diabetes Self-Management Education   Pre-program Assessment    Reason for Referral: DSMES. Patient to determine if she wants to complete class series vs focused education that is tailored to her needs, as she completed a DSMES program 8 years ago.  Referral Source: Madelin Rear, MD    Metric Patient responses (08/29/2018)   A1c  (Goal: 7%)   Recent value:   Lab Results   Component Value Date/Time    Hemoglobin A1c 12.7 (H) 06/07/2018 03:58 PM        Healthy Eating     24-hour Dietary Recall:  Breakfast: breakfast bowl from [employer] (egg, beef/steak, veggies) OR eggs with 2 pc of cheese, bacon, & sausage  Lunch: salad w/ grilled chicken, egg, cheese , romaine lettuce OR chicken tenders w/ a little breading, non-starchy veggies  Dinner: 4 oz salmon & shrimp or air fried chicken w/ non-starchy veggies  Snacks: pork rinds, imitation crab meat, mixed nuts  Beverages: water, Crystal Lite, occasional diet Mountain Dew  Alcohol: very rare, 1x/6 months    Stage of change: Action  Patient has been seeing a Chief Operating Officer and reports she has significantly changed her diet over the past 3 months. She is eating more protein and has decreased or cut out breads, starches, & starchy vegetables. She is choosing foods with less added sugar & real ingredients. She avoids fruits because she is fearful that they will trigger a sugar craving that will lead to overeating fruit.   Being Active     Physical Activity Vital Sign:  How many days during the past week have you performed physical activity where your heart beats faster and your breathing is harder than normal for 30 minutes or more?  0 days    How many days in a typical week do you perform activity such as this?  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 She is not doing any other physical activity at this time.    Monitoring Do you monitor your blood sugar? Yes    How often do you monitor? 4-6x/day per [facility]    What are the range of readings? 85-157 mg/dL  Breakfast: 114 mg/dL (always <121 mg/dL)  Lunch: 123 mg/dL (2 hr post prandial 119 mg/dL)  Dinner: <157 mg/dL  Bedtime: <157 mg/dL    Do you know your last A1c measurement? Yes    Do you know the meaning of the A1c? Yes    Stage of change: Action  Patient reports blood sugars were in the 400s up until recently when she changed her diet.   Taking Medications Medication Management:  Do you understand what your diabetes medications do? Yes    Can you afford your diabetes medications? Yes    How often do you miss doses of your diabetes medications? Typically never, but recently missed 2 doses in 1 week d/t falling asleep    Current dosing:   Key Antihyperglycemic Medications             insulin glargine (Lantus Solostar U-100 Insulin) 100 unit/mL (3 mL) inpn INJECT 74 UNITS UNDER THE SKIN AT BEDTIME    metFORMIN ER (GLUCOPHAGE XR) 500 mg tablet TAKE 2 TABLETS BY MOUTH DAILY WITH DINNER          Blood Pressure Management:  Key ACE/ARB Medications  lisinopril (PRINIVIL, ZESTRIL) 10 mg tablet TAKE 1 TABLET BY MOUTH EVERY DAY          Lipid Management:  Key Antihyperlipidemia Meds             rosuvastatin (CRESTOR) 10 mg tablet Take 1 Tab by mouth nightly.          Clot Prevention:  Key Anti-Platelet Anticoagulant Meds     The patient is on no antiplatelet meds or anticoagulants.          Stage of change: Maintenance     Healthy Coping Diabetes Skills, Confidence and Preparedness Index:  Total score: n/a  Skills: n/a  Confidence: n/a  Preparedness: n/a    Stage of change: Unable to assess. Patient to complete [facility] tool if she decides to complete DSMES class series.     Reducing Risks Vaccines:  Influenza:   Immunization History   Administered Date(s) Administered   ??? Influenza Vaccine 01/16/2018   ??? Influenza Vaccine (Quad) PF 02/01/2017       Pneumococcal:    Immunization History   Administered Date(s) Administered   ??? Pneumococcal Polysaccharide (PPSV-23) 03/24/2011        Hepatitis: Patient believes she received the vaccine in 2017 when she started her job.    Examinations:  Diabetic Foot and Eye Exam HM Status   Topic Date Due   ??? Diabetic Foot Care  03/04/2017   ??? 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 Shaking/trembling    How do you prevent hypoglycemia? Consistent meals/snack times and Monitors BG frequently    How do you treat hypoglycemia? Rule of 15   Reviewed rule of 15    Patient was having hypoglycemia when she was taking glipizide, but this medication was d/c by her PCP. Since then, she denies any more episodes of hypoglycemia.    Hyperglycemia Management:  What are signs and symptoms of hyperglycemia that you experience? n/a    How can you prevent hyperglycemia? Take medication as instructed, Focus on carbohydrate counting/meal planning, Monitor blood glucose    Sick Day Management:  What do you do differently on sick days? Stay hydrated, Take diabetes medication as instructed by provider    Pattern Management:  Do you notice blood glucose patterns when you look at the readings in your meter or logbook? Yes     How do you use the blood glucose readings from your meter or logbook? Understand how body responds to meals, Understand how body responds to medications and/or insulin    Stage of change: Action   Note: Content derived from the American Association of Diabetes Educators' Diabetes Education  Curriculum: A Guide to Successful Self-Management (2nd edition)      Diabetes Educator Recommendations:  - Address diabetes self-care behaviors through education and support using AADE7 Curriculum. Pt to attend weekly individual sessions on reducing risks, healthy eating, being active, monitoring, taking medications, healthy coping and problem solving.  - Pt to follow up with provider on the following: n/a  -Patient will follow up with her insurance company to determine number of hours of DSMES covered this year, as she previously completed a DSMES program 8 years ago and is not sure if she should complete a full program or receive focused education tailored to her needs.    Diabetes Self-Management Education Follow-up Visit: TBD. Patient to contact this RN with her decision re: completing class series vs tailored education.      ZOXWR-60 Public Health Emergency Adaptations for Telehealth:  Alyssa [name] is a 38 y.o. female being evaluated by a Virtual Visit (video visit) encounter to address concerns as mentioned above.  A caregiver was present when appropriate. Due to this being a Scientist, physiological (During AVWUJ-81 public health emergency), evaluation of the following organ systems was limited: Vitals/Constitutional/EENT/Resp/CV/GI/GU/MS/Neuro/Skin/Heme-Lymph-Imm.  Pursuant to the emergency declaration under the [place], [place] waiver authority and the [employer] Act, this Virtual Visit was conducted with patient's (and/or legal guardian's) consent, to reduce the risk of exposure to COVID-19 and provide necessary medical care.       Services were provided through a video synchronous discussion virtually to substitute for in-person encounter.    --Laqueta Linden, RN on 08/29/2018 at 9:34 AM    An electronic signature was used to authenticate this note. I was in the office for the appointment and time spent: 30 minutes

## 2018-08-29 NOTE — Progress Notes (Signed)
[facility]  Diabetes Self-Management Education   Pre-program Assessment    Reason for Referral: DSMES. Patient to determine if she wants to complete class series vs focused education that is tailored to her needs, as she completed a DSMES program 8 years ago.  Referral Source: Scheryl Marten, MD    Metric Patient responses (08/29/2018)   A1c  (Goal: 7%)   Recent value:   Lab Results   Component Value Date/Time    Hemoglobin A1c 12.7 (H) 06/07/2018 03:58 PM        Healthy Eating     24-hour Dietary Recall:  Breakfast: breakfast bowl from [employer] (egg, beef/steak, veggies) OR eggs with 2 pc of cheese, bacon, & sausage  Lunch: salad w/ grilled chicken, egg, cheese , romaine lettuce OR chicken tenders w/ a little breading, non-starchy veggies  Dinner: 4 oz salmon & shrimp or air fried chicken w/ non-starchy veggies  Snacks: pork rinds, imitation crab meat, mixed nuts  Beverages: water, Crystal Lite, occasional diet Mountain Dew  Alcohol: very rare, 1x/6 months    Stage of change: Action  Patient has been seeing a Artist and reports she has significantly changed her diet over the past 3 months. She is eating more protein and has decreased or cut out breads, starches, & starchy vegetables. She is choosing foods with less added sugar & real ingredients. She avoids fruits because she is fearful that they will trigger a sugar craving that will lead to overeating fruit.   Being Active     Physical Activity Vital Sign:  How many days during the past week have you performed physical activity where your heart beats faster and your breathing is harder than normal for 30 minutes or more?  0 days    How many days in a typical week do you perform activity such as this?  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 She is not doing any other physical activity at this time.   Monitoring Do you monitor  your blood sugar? Yes    How often do you monitor? 4-6x/day per [facility]    What are the range of readings? 85-157 mg/dL  Breakfast: 161 mg/dL (always <096 mg/dL)  Lunch: 045 mg/dL (2 hr post prandial 409 mg/dL)  Dinner: <811 mg/dL  Bedtime: <914 mg/dL    Do you know your last A1c measurement? Yes    Do you know the meaning of the A1c? Yes    Stage of change: Action  Patient reports blood sugars were in the 400s up until recently when she changed her diet.   Taking Medications Medication Management:  Do you understand what your diabetes medications do? Yes    Can you afford your diabetes medications? Yes    How often do you miss doses of your diabetes medications? Typically never, but recently missed 2 doses in 1 week d/t falling asleep    Current dosing:   Key Antihyperglycemic Medications             insulin glargine (Lantus Solostar U-100 Insulin) 100 unit/mL (3 mL) inpn INJECT 74 UNITS UNDER THE SKIN AT BEDTIME    metFORMIN ER (GLUCOPHAGE XR) 500 mg tablet TAKE 2 TABLETS BY MOUTH DAILY WITH DINNER          Blood Pressure Management:  Key ACE/ARB Medications  lisinopril (PRINIVIL, ZESTRIL) 10 mg tablet TAKE 1 TABLET BY MOUTH EVERY DAY          Lipid Management:  Key Antihyperlipidemia Meds             rosuvastatin (CRESTOR) 10 mg tablet Take 1 Tab by mouth nightly.          Clot Prevention:  Key Anti-Platelet Anticoagulant Meds     The patient is on no antiplatelet meds or anticoagulants.          Stage of change: Maintenance     Healthy Coping Diabetes Skills, Confidence and Preparedness Index:  Total score: n/a  Skills: n/a  Confidence: n/a  Preparedness: n/a    Stage of change: Unable to assess. Patient to complete [facility] tool if she decides to complete DSMES class series.     Reducing Risks Vaccines:  Influenza:   Immunization History   Administered Date(s) Administered   . Influenza Vaccine 01/16/2018   . Influenza Vaccine (Quad) PF 02/01/2017       Pneumococcal:   Immunization History    Administered Date(s) Administered   . Pneumococcal Polysaccharide (PPSV-23) 03/24/2011        Hepatitis: Patient believes she received the vaccine in 2017 when she started her job.    Examinations:  Diabetic Foot and Eye Exam HM Status   Topic Date Due   . Diabetic Foot Care  03/04/2017   . 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 Shaking/trembling    How do you prevent hypoglycemia? Consistent meals/snack times and Monitors BG frequently    How do you treat hypoglycemia? Rule of 15   Reviewed rule of 15    Patient was having hypoglycemia when she was taking glipizide, but this medication was d/c by her PCP. Since then, she denies any more episodes of hypoglycemia.    Hyperglycemia Management:  What are signs and symptoms of hyperglycemia that you experience? n/a    How can you prevent hyperglycemia? Take medication as instructed, Focus on carbohydrate counting/meal planning, Monitor blood glucose    Sick Day Management:  What do you do differently on sick days? Stay hydrated, Take diabetes medication as instructed by provider    Pattern Management:  Do you notice blood glucose patterns when you look at the readings in your meter or logbook? Yes    How do you use the blood glucose readings from  your meter or logbook? Understand how body responds to meals, Understand how body responds to medications and/or insulin    Stage of change: Action   Note: Content derived from the American Association of Diabetes Educators' Diabetes Education  Curriculum: A Guide to Successful Self-Management (2nd edition)      Diabetes Educator Recommendations:  - Address diabetes self-care behaviors through education and support using AADE7 Curriculum. Pt to attend weekly individual sessions on reducing risks, healthy eating, being active, monitoring, taking medications, healthy coping and problem solving.  - Pt to follow up with provider on the following: n/a  -Patient will follow up with her insurance company to determine number of hours of DSMES covered this year, as she previously completed a DSMES program 8 years ago and is not sure if she should complete a full program or receive focused education tailored to her needs.    Diabetes Self-Management Education Follow-up Visit: TBD. Patient to contact this RN with her decision re: completing class series vs tailored education.      COVID-19 Public Health Emergency Adaptations for Telehealth:  Alyssa [name] is a 38 y.o. female being evaluated by a Virtual Visit (video visit) encounter to address concerns as mentioned above.  A caregiver was present when appropriate. Due to this being a Scientist, research (medical) (During COVID-19 public health emergency), evaluation of the following organ systems was limited: Vitals/Constitutional/EENT/Resp/CV/GI/GU/MS/Neuro/Skin/Heme-Lymph-Imm.  Pursuant to the emergency declaration under the [facility] Act and the [employer], 1135 waiver authority and the [employer] and [employer] Act, this Virtual Visit was conducted with patient's (and/or legal guardian's) consent, to reduce the risk of exposure to COVID-19 and provide necessary medical care.      Services were provided through a video synchronous  discussion virtually to substitute for in-person encounter.    --Danie Binder, RN on 08/29/2018 at 9:34 AM    An electronic signature was used to authenticate this note. I was in the office for the appointment and time spent: 30 minutes

## 2018-08-31 ENCOUNTER — Encounter

## 2018-08-31 MED ORDER — LISINOPRIL 10 MG TAB
10 mg | ORAL_TABLET | ORAL | 2 refills | Status: DC
Start: 2018-08-31 — End: 2018-11-22

## 2018-08-31 MED ORDER — METFORMIN SR 500 MG 24 HR TABLET
500 mg | ORAL_TABLET | ORAL | 2 refills | Status: DC
Start: 2018-08-31 — End: 2018-11-22

## 2018-08-31 MED ORDER — ROSUVASTATIN 10 MG TAB
10 mg | ORAL_TABLET | ORAL | 0 refills | Status: DC
Start: 2018-08-31 — End: 2018-11-22

## 2018-09-14 ENCOUNTER — Inpatient Hospital Stay: Admit: 2018-09-14 | Payer: MEDICAID | Primary: Family Medicine

## 2018-09-14 NOTE — Progress Notes (Signed)
Good work, not at goal but your a1c was improved, keep it up! downf rom 12.7 to 9.5, goal is 7.5 or below  Electrolytes and kidney functions OK, liver functions OK  Persistent mild elevation in Platelets, no anemia or signs of infection

## 2018-09-15 LAB — CBC
Hematocrit: 37.9 % (ref 35.0–47.0)
Hemoglobin: 11.7 g/dL (ref 11.5–16.0)
MCH: 25.7 PG — ABNORMAL LOW (ref 26.0–34.0)
MCHC: 30.9 g/dL (ref 30.0–36.5)
MCV: 83.1 FL (ref 80.0–99.0)
MPV: 12.4 FL (ref 8.9–12.9)
NRBC Absolute: 0 10*3/uL (ref 0.00–0.01)
Nucleated RBCs: 0 PER 100 WBC
Platelets: 402 10*3/uL — ABNORMAL HIGH (ref 150–400)
RBC: 4.56 M/uL (ref 3.80–5.20)
RDW: 15.2 % — ABNORMAL HIGH (ref 11.5–14.5)
WBC: 9.6 10*3/uL (ref 3.6–11.0)

## 2018-09-15 LAB — COMPREHENSIVE METABOLIC PANEL
ALT: 21 U/L (ref 12–78)
AST: 10 U/L — ABNORMAL LOW (ref 15–37)
Albumin/Globulin Ratio: 0.9 — ABNORMAL LOW (ref 1.1–2.2)
Albumin: 3.6 g/dL (ref 3.5–5.0)
Alkaline Phosphatase: 62 U/L (ref 45–117)
Anion Gap: 7 mmol/L (ref 5–15)
BUN/Creatinine Ratio: 13 (ref 12–20)
BUN: 10 MG/DL (ref 6–20)
CO2: 25 mmol/L (ref 21–32)
Calcium: 9.4 MG/DL (ref 8.5–10.1)
Chloride: 103 mmol/L (ref 97–108)
Creatinine: 0.77 MG/DL (ref 0.55–1.02)
GFR African American: >60 mL/min/{1.73_m2} (ref >60)
Globulin: 4.2 g/dL — ABNORMAL HIGH (ref 2.0–4.0)
Glucose: 231 mg/dL — ABNORMAL HIGH (ref 65–100)
Potassium: 4 mmol/L (ref 3.5–5.1)
Sodium: 135 mmol/L — ABNORMAL LOW (ref 136–145)
Total Bilirubin: 0.7 MG/DL (ref 0.2–1.0)
Total Protein: 7.8 g/dL (ref 6.4–8.2)
eGFR NON-AA: >60 mL/min/{1.73_m2} (ref >60)

## 2018-09-15 LAB — HEMOGLOBIN A1C W/EAG
Estimated Avg Glucose: 226 mg/dL
Hemoglobin A1C: 9.5 % — ABNORMAL HIGH (ref 4.0–5.6)

## 2018-09-15 LAB — METABOLIC PANEL, COMPREHENSIVE
A-G Ratio: 0.9 — ABNORMAL LOW (ref 1.1–2.2)
ALT (SGPT): 21 U/L (ref 12–78)
AST (SGOT): 10 U/L — ABNORMAL LOW (ref 15–37)
Albumin: 3.6 g/dL (ref 3.5–5.0)
Alk. phosphatase: 62 U/L (ref 45–117)
Anion gap: 7 mmol/L (ref 5–15)
BUN/Creatinine ratio: 13 (ref 12–20)
BUN: 10 MG/DL (ref 6–20)
Bilirubin, total: 0.7 MG/DL (ref 0.2–1.0)
CO2: 25 mmol/L (ref 21–32)
Calcium: 9.4 MG/DL (ref 8.5–10.1)
Chloride: 103 mmol/L (ref 97–108)
Creatinine: 0.77 MG/DL (ref 0.55–1.02)
GFR est AA: >60 mL/min/{1.73_m2} (ref >60)
GFR est non-AA: >60 mL/min/{1.73_m2} (ref >60)
Globulin: 4.2 g/dL — ABNORMAL HIGH (ref 2.0–4.0)
Glucose: 231 mg/dL — ABNORMAL HIGH (ref 65–100)
Potassium: 4 mmol/L (ref 3.5–5.1)
Protein, total: 7.8 g/dL (ref 6.4–8.2)
Sodium: 135 mmol/L — ABNORMAL LOW (ref 136–145)

## 2018-09-15 LAB — CBC W/O DIFF
ABSOLUTE NRBC: 0 10*3/uL (ref 0.00–0.01)
HCT: 37.9 % (ref 35.0–47.0)
HGB: 11.7 g/dL (ref 11.5–16.0)
MCH: 25.7 PG — ABNORMAL LOW (ref 26.0–34.0)
MCHC: 30.9 g/dL (ref 30.0–36.5)
MCV: 83.1 FL (ref 80.0–99.0)
MPV: 12.4 FL (ref 8.9–12.9)
NRBC: 0 PER 100 WBC
PLATELET: 402 10*3/uL — ABNORMAL HIGH (ref 150–400)
RBC: 4.56 M/uL (ref 3.80–5.20)
RDW: 15.2 % — ABNORMAL HIGH (ref 11.5–14.5)
WBC: 9.6 10*3/uL (ref 3.6–11.0)

## 2018-09-15 LAB — HEMOGLOBIN A1C WITH EAG
Est. average glucose: 226 mg/dL
Hemoglobin A1c: 9.5 % — ABNORMAL HIGH (ref 4.0–5.6)

## 2018-09-17 NOTE — Progress Notes (Signed)
Good work, not at goal but your a1c was improved, keep it up! downf rom 12.7 to 9.5, goal is 7.5 or below  Electrolytes and kidney functions OK, liver functions OK  Persistent mild elevation in Platelets, no anemia or signs of infection

## 2018-09-24 NOTE — Telephone Encounter (Signed)
Left message    Cancelled in-lab sleep study scheduled in June, due to COVID-19 Department Closure.     Routing message to ordering provider for next steps.

## 2018-09-26 ENCOUNTER — Ambulatory Visit: Payer: PRIVATE HEALTH INSURANCE | Primary: Family Medicine

## 2018-09-26 NOTE — Telephone Encounter (Signed)
Patient was prescribed an APAP device on 08-17-18. Adherence visit to be scheduled, need for additional testing to be determined at visit.

## 2018-10-10 NOTE — Telephone Encounter (Signed)
Agree.

## 2018-10-10 NOTE — Telephone Encounter (Signed)
Pt having problems with her blood sugar and needs to speak with someone.  Please call [phone]

## 2018-10-10 NOTE — Telephone Encounter (Signed)
After verifying patient's ID, patient advised that her blood sugar has been in the low to mid 200'2 for the pat week. Dr Aleene Davidson had previously d/c'd patient's Glipizide because patient had changed her diet and blood sugars we're dropping in the mid to upper 100's. Patient states that she did eat white rice and refried beans on yesterday. However patient would like to go back to her diet and monitor blood sugars over the weekend. Patient states that she will wait until Monday to follow up with Dr Aleene Davidson directly.

## 2018-10-24 ENCOUNTER — Telehealth: Attending: Family Medicine | Primary: Family Medicine

## 2018-10-24 ENCOUNTER — Encounter: Attending: Family Medicine | Primary: Family Medicine

## 2018-10-24 ENCOUNTER — Telehealth
Admit: 2018-10-24 | Discharge: 2018-10-24 | Payer: PRIVATE HEALTH INSURANCE | Attending: Family Medicine | Primary: Family Medicine

## 2018-10-24 DIAGNOSIS — G5602 Carpal tunnel syndrome, left upper limb: Secondary | ICD-10-CM

## 2018-10-24 MED ORDER — NAPROXEN 500 MG TAB
500 mg | ORAL_TABLET | Freq: Two times a day (BID) | ORAL | 0 refills | Status: DC
Start: 2018-10-24 — End: 2019-07-10

## 2018-10-24 NOTE — Progress Notes (Signed)
Alyssa [name] is a 38 y.o. female who was seen by synchronous (real-time) audio-video technology on 10/24/2018.      Consent: Alvia Jablonski, who was seen by synchronous (real-time) audio-video technology, and/or her healthcare decision maker, is aware that this patient-initiated, Telehealth encounter on 10/24/2018 is a billable service, with coverage as determined by her insurance carrier. She is aware that she may receive a bill and has provided verbal consent to proceed: Yes.    Assessment & Plan:   1. Left carpal tunnel syndrome  Get brace (her mom has a Carpal Tunnel brace for the L)  Start NSAID (take bid with food)  If not sufficient, ortho hand referral placed for eval  Consider tsh check with n ext labs, last tsh 1 year ago was well in normal range  Consider that weight gain may worsen Carpal tunnel symptoms and weight loss may improve them  - REFERRAL TO ORTHOPEDICS    2. Type 2 diabetes mellitus with complication, with long-term current use of insulin (HCC)  IMPROVED but not yet to goal. Pt has goal in next few months of increasing exercise and eating different working with life and nutrition coach and following a keto diet with a goal of weight loss and improved overal health and risk factor reduction    3. Obesity, morbid ([place])  As above  Continue all meds as prescribed for now          Pt was counseled on risks, benefits and alternatives of treatment options. All questions were asked and answered and the patient was agreeable with the treatment plan as outlined.    Encounter time today was 25 minutes and more than 50% of this encounter was spent in counseling face-to-face regarding Diagnosis, Patient Education, Medication Management, Compliance and Impressions.  Time documented includes face to face time, documentation and chart review if applicable except as noted.  Subjective:   Alyssa [name] is a 38 y.o. female who was seen for Numbness (in left hand- thinks it may be carpal tunnel ) and Tingling       Numbness in L hand  Ongoing but worsening  Just holding the phone causes tingling and if she shakes it out it gets better most of the time  Its OK in the morning, but aggravated when using hand (typing on computer)    Weight is going up she is 270 lbs now, she reports she's eating more protein and she thinks that is the problem    8-10000 steps per day on her fit bit, doing 15 jumping jacks in the morning and eating a predominantly keto diet  Lab Results   Component Value Date/Time    Hemoglobin A1c 9.5 (H) 09/14/2018 02:42 PM    Hemoglobin A1c 12.7 (H) 06/07/2018 03:58 PM    Hemoglobin A1c 11.1 (H) 03/01/2018 04:32 PM     She's doing a GREAT job with diabetes, brought a1c down 3 points. She is using insulin now and being diligent with control. No events of hypoglycemia that she is reporting.    Medications, allergies, PMH, PSH, SOCH, [place] reviewed and updated per routine protocol, see chart for review and changes if not noted here.    ROS  A 12 point review of systems was negative except as noted here or in the HPI.    Objective:   Vital Signs: (As obtained by patient/caregiver at home)  Visit Vitals  LMP 03/08/2016        [INSTRUCTIONS:  "[x] " Indicates a positive item  "[] "  Indicates a negative item  -- DELETE ALL ITEMS NOT EXAMINED]    Constitutional: [x]  Appears well-developed and well-nourished [x]  No apparent distress      []  Abnormal -     Mental status: [x]  Alert and awake  [x]  Oriented to person/place/time [x]  Able to follow commands    []  Abnormal -     Eyes:   EOM    [x]   Normal    []  Abnormal -   Sclera  [x]   Normal    []  Abnormal -          Discharge [x]   None visible   []  Abnormal -     HENT: [x]  Normocephalic, atraumatic  []  Abnormal -   [x]  Mouth/Throat: Mucous membranes are moist    External Ears [x]  Normal  []  Abnormal -    Neck: [x]  No visualized mass []  Abnormal -     Pulmonary/Chest: [x]  Respiratory effort normal   [x]  No visualized signs of difficulty breathing or respiratory distress         []  Abnormal -      Musculoskeletal:   [x]  Normal gait with no signs of ataxia         [x]  Normal range of motion of neck        []  Abnormal -     Neurological:        [x]  No Facial Asymmetry (Cranial nerve 7 motor function) (limited exam due to video visit)          [x]  No gaze palsy        []  Abnormal -          Skin:        [x]  No significant exanthematous lesions or discoloration noted on facial skin         []  Abnormal -            Psychiatric:       [x]  Normal Affect []  Abnormal -        [x]  No Hallucinations    Other pertinent observable physical exam findings:morbidly obese, shaking left hand as if trying to wake it up, + self report of tinnels and phalens on the left    We discussed the expected course, resolution and complications of the diagnosis(es) in detail.  Medication risks, benefits, costs, interactions, and alternatives were discussed as indicated.  I advised her to contact the office if her condition worsens, changes or fails to improve as anticipated. She expressed understanding with the diagnosis(es) and plan.       Alyssa [name] is a 38 y.o. female who was evaluated by a video visit encounter for concerns as above. Patient identification was verified prior to start of the visit. A caregiver was present when appropriate. Due to this being a Scientist, physiological (During MWUXL-24 public health emergency), evaluation of the following organ systems was limited: Vitals/Constitutional/EENT/Resp/CV/GI/GU/MS/Neuro/Skin/Heme-Lymph-Imm.  Pursuant to the emergency declaration under the [place], 1135 waiver authority and the [employer] Act, this Virtual  Visit was conducted, with patient's (and/or legal guardian's) consent, to reduce the patient's risk of exposure to COVID-19 and provide necessary medical care.     Services were provided through a video synchronous discussion virtually to  substitute for in-person clinic visit.   Patient and provider were located at their individual homes.      Doneen Poisson, MD  [facility]  10/24/18 1:28 PM  Portions of this note may have been populated using smart dictation software and may have "sounds-like" errors present.

## 2018-10-24 NOTE — Progress Notes (Signed)
Pt is having virtual appointment at home in [place], [place].   Chief Complaint   Patient presents with   ??? Numbness     in left hand- thinks it may be carpal tunnel    ??? Tingling

## 2018-10-24 NOTE — Progress Notes (Signed)
Alyssa [name] is a 38 y.o. female who was seen by synchronous (real-time) audio-video technology on 10/24/2018.      Consent: Alyssa [name], who was seen by synchronous (real-time) audio-video technology, and/or her healthcare decision maker, is aware that this patient-initiated, Telehealth encounter on 10/24/2018 is a billable service, with coverage as determined by her insurance carrier. She is aware that she may receive a bill and has provided verbal consent to proceed: Yes.    Assessment & Plan:   1. Left carpal tunnel syndrome  Get brace (her mom has a Carpal Tunnel brace for the L)  Start NSAID (take bid with food)  If not sufficient, ortho hand referral placed for eval  Consider tsh check with n ext labs, last tsh 1 year ago was well in normal range  Consider that weight gain may worsen Carpal tunnel symptoms and weight loss may improve them  - REFERRAL TO ORTHOPEDICS    2. Type 2 diabetes mellitus with complication, with long-term current use of insulin (HCC)  IMPROVED but not yet to goal. Pt has goal in next few months of increasing exercise and eating different working with life and nutrition coach and following a keto diet with a goal of weight loss and improved overal health and risk factor reduction    3. Obesity, morbid ([place])  As above  Continue all meds as prescribed for now          Pt was counseled on risks, benefits and alternatives of treatment options. All questions were asked and answered and the patient was agreeable with the treatment plan as outlined.    Encounter time today was 25 minutes and more than 50% of this encounter was spent in counseling face-to-face regarding Diagnosis, Patient Education, Medication Management, Compliance and Impressions.  Time documented includes face to face time, documentation and chart review if applicable except as noted.  Subjective:   Alyssa [name] is a 38 y.o. female who was seen for Numbness (in left hand- thinks it may be carpal tunnel ) and  Tingling      Numbness in L hand  Ongoing but worsening  Just holding the phone causes tingling and if she shakes it out it gets better most of the time  Its OK in the morning, but aggravated when using hand (typing on computer)    Weight is going up she is 270 lbs now, she reports she's eating more protein and she thinks that is the problem    8-10000 steps per day on her fit bit, doing 15 jumping jacks in the morning and eating a predominantly keto diet  Lab Results   Component Value Date/Time    Hemoglobin A1c 9.5 (H) 09/14/2018 02:42 PM    Hemoglobin A1c 12.7 (H) 06/07/2018 03:58 PM    Hemoglobin A1c 11.1 (H) 03/01/2018 04:32 PM     She's doing a GREAT job with diabetes, brought a1c down 3 points. She is using insulin now and being diligent with control. No events of hypoglycemia that she is reporting.    Medications, allergies, PMH, PSH, SOCH, Alyssa [name] reviewed and updated per routine protocol, see chart for review and changes if not noted here.    ROS  A 12 point review of systems was negative except as noted here or in the HPI.    Objective:   Vital Signs: (As obtained by patient/caregiver at home)  Visit Vitals  LMP 03/08/2016        [INSTRUCTIONS:  "[x] " Indicates a positive item  "[] "  Indicates a negative item  -- DELETE ALL ITEMS NOT EXAMINED]    Constitutional: [x]  Appears well-developed and well-nourished [x]  No apparent distress      []  Abnormal -     Mental status: [x]  Alert and awake  [x]  Oriented to person/place/time [x]  Able to follow commands    []  Abnormal -     Eyes:   EOM    [x]   Normal    []  Abnormal -   Sclera  [x]   Normal    []  Abnormal -          Discharge [x]   None visible   []  Abnormal -     HENT: [x]  Normocephalic, atraumatic  []  Abnormal -   [x]  Mouth/Throat: Mucous membranes are moist    External Ears [x]  Normal  []  Abnormal -    Neck: [x]  No visualized mass []  Abnormal -     Pulmonary/Chest: [x]  Respiratory effort normal   [x]  No visualized signs of difficulty breathing or respiratory  distress        []  Abnormal -      Musculoskeletal:   [x]  Normal gait with no signs of ataxia         [x]  Normal range of motion of neck        []  Abnormal -     Neurological:        [x]  No Facial Asymmetry (Cranial nerve 7 motor function) (limited exam due to video visit)          [x]  No gaze palsy        []  Abnormal -          Skin:        [x]  No significant exanthematous lesions or discoloration noted on facial skin         []  Abnormal -            Psychiatric:       [x]  Normal Affect []  Abnormal -        [x]  No Hallucinations    Other pertinent observable physical exam findings:morbidly obese, shaking left hand as if trying to wake it up, + self report of tinnels and phalens on the left    We discussed the expected course, resolution and complications of the diagnosis(es) in detail.  Medication risks, benefits, costs, interactions, and alternatives were discussed as indicated.  I advised her to contact the office if her condition worsens, changes or fails to improve as anticipated. She expressed understanding with the diagnosis(es) and plan.       Alyssa [name] is a 38 y.o. female who was evaluated by a video visit encounter for concerns as above. Patient identification was verified prior to start of the visit. A caregiver was present when appropriate. Due to this being a Scientist, physiological (During VFIEP-32 public health emergency), evaluation of the following organ systems was limited: Vitals/Constitutional/EENT/Resp/CV/GI/GU/MS/Neuro/Skin/Heme-Lymph-Imm.  Pursuant to the emergency declaration under the [place], 1135 waiver authority and the [employer] Act, this Virtual  Visit was conducted, with patient's (and/or legal guardian's) consent, to reduce the patient's risk of exposure to COVID-19 and provide necessary medical care.     Services were provided through a video synchronous discussion virtually to substitute  for in-person clinic visit.   Patient and provider were located at their individual homes.      Doneen Poisson, MD  [facility]  10/24/18 1:28 PM  Portions of this note may have been populated using smart dictation software and may have "sounds-like" errors present.

## 2018-10-24 NOTE — Progress Notes (Signed)
 Pt is having virtual appointment at home in [place], [place].   Chief Complaint   Patient presents with   . Numbness     in left hand- thinks it may be carpal tunnel    . Tingling

## 2018-10-25 ENCOUNTER — Emergency Department: Admit: 2018-10-26 | Payer: PRIVATE HEALTH INSURANCE | Primary: Family Medicine

## 2018-10-25 DIAGNOSIS — R0789 Other chest pain: Secondary | ICD-10-CM

## 2018-10-25 NOTE — Telephone Encounter (Signed)
Reason for Disposition  ??? [1] Chest pain lasts > 5 minutes AND [2] described as crushing, pressure-like, or heavy    Answer Assessment - Initial Assessment Questions  1. LOCATION: "Where does it hurt?"        Center of chest   2. RADIATION: "Does the pain go anywhere else?" (e.g., into neck, jaw, arms, back)      Denies pain radiating anywhere else  3. ONSET: "When did the chest pain begin?" (Minutes, hours or days)       Yesterday afternoon  4. PATTERN "Does the pain come and go, or has it been constant since it started?"  "Does it get worse with exertion?"       Comes and goes   5. DURATION: "How long does it last" (e.g., seconds, minutes, hours)      Current episode started 20 minutes ago  6. SEVERITY: "How bad is the pain?"  (e.g., Scale 1-10; mild, moderate, or severe)     - MILD (1-3): doesn't interfere with normal activities      - MODERATE (4-7): interferes with normal activities or awakens from sleep     - SEVERE (8-10): excruciating pain, unable to do any normal activities        Current pain level is 5/10  7. CARDIAC RISK FACTORS: "Do you have any history of heart problems or risk factors for heart disease?" (e.g., prior heart attack, angina; high blood pressure, diabetes, being overweight, high cholesterol, smoking, or strong family history of heart disease)      Does see a cardiologist states for sleep apnea  8. PULMONARY RISK FACTORS: "Do you have any history of lung disease?"  (e.g., blood clots in lung, asthma, emphysema, birth control pills)      No lung issues  9. CAUSE: "What do you think is causing the chest pain?"      Unusre  10. OTHER SYMPTOMS: "Do you have any other symptoms?" (e.g., dizziness, nausea, vomiting, sweating, fever, difficulty breathing, cough)        No dizziness, no lightheadedness, no nausea, no vomiting, no fever, no sob, no cough, no unusual sweating.    Protocols used: CHEST PAIN-ADULT-AH

## 2018-10-25 NOTE — ED Triage Notes (Signed)
Patient presents to ED for complaints of chest pain/pressure that began last night. Denies any radiation to jaw or arms. States that it is midline and radiates to left breast region. Pain can be reproduced.

## 2018-10-25 NOTE — ED Notes (Signed)
Patient presents to ED for complaints of chest pain/pressure that began last night. Denies any radiation to jaw or arms. States that it is midline and radiates to left breast region. Pain can be reproduced.

## 2018-10-26 ENCOUNTER — Inpatient Hospital Stay
Admit: 2018-10-26 | Discharge: 2018-10-26 | Disposition: A | Discharge status: Home / Self Care | Payer: PRIVATE HEALTH INSURANCE | Attending: Emergency Medicine

## 2018-10-26 LAB — COMPREHENSIVE METABOLIC PANEL
ALT: 16 U/L (ref 12–78)
AST: 12 U/L — ABNORMAL LOW (ref 15–37)
Albumin/Globulin Ratio: 0.8 — ABNORMAL LOW (ref 1.1–2.2)
Albumin: 3.4 g/dL — ABNORMAL LOW (ref 3.5–5.0)
Alkaline Phosphatase: 65 U/L (ref 45–117)
Anion Gap: 8 mmol/L (ref 5–15)
BUN: 12 MG/DL (ref 6–20)
Bun/Cre Ratio: 15 (ref 12–20)
CO2: 26 mmol/L (ref 21–32)
Calcium: 8.6 MG/DL (ref 8.5–10.1)
Chloride: 100 mmol/L (ref 97–108)
Creatinine: 0.82 MG/DL (ref 0.55–1.02)
EGFR IF NonAfrican American: >60 mL/min/{1.73_m2} (ref >60)
GFR African American: >60 mL/min/{1.73_m2} (ref >60)
Globulin: 4.2 g/dL — ABNORMAL HIGH (ref 2.0–4.0)
Glucose: 347 mg/dL — ABNORMAL HIGH (ref 65–100)
Potassium: 4 mmol/L (ref 3.5–5.1)
Sodium: 134 mmol/L — ABNORMAL LOW (ref 136–145)
Total Bilirubin: 0.3 MG/DL (ref 0.2–1.0)
Total Protein: 7.6 g/dL (ref 6.4–8.2)

## 2018-10-26 LAB — CBC WITH AUTO DIFFERENTIAL
Basophils %: 1 % (ref 0–1)
Basophils Absolute: 0.1 10*3/uL (ref 0.0–0.1)
Eosinophils %: 2 % (ref 0–7)
Eosinophils Absolute: 0.2 10*3/uL (ref 0.0–0.4)
Granulocyte Absolute Count: 0.1 10*3/uL — ABNORMAL HIGH (ref 0.00–0.04)
Hematocrit: 36 % (ref 35.0–47.0)
Hemoglobin: 11.6 g/dL (ref 11.5–16.0)
Immature Granulocytes: 1 % — ABNORMAL HIGH (ref 0.0–0.5)
Lymphocytes %: 33 % (ref 12–49)
Lymphocytes Absolute: 3.5 10*3/uL (ref 0.8–3.5)
MCH: 26.1 PG (ref 26.0–34.0)
MCHC: 32.2 g/dL (ref 30.0–36.5)
MCV: 81.1 FL (ref 80.0–99.0)
MPV: 11.5 FL (ref 8.9–12.9)
Monocytes %: 6 % (ref 5–13)
Monocytes Absolute: 0.6 10*3/uL (ref 0.0–1.0)
NRBC Absolute: 0 10*3/uL (ref 0.00–0.01)
Neutrophils %: 57 % (ref 32–75)
Neutrophils Absolute: 6 10*3/uL (ref 1.8–8.0)
Nucleated RBCs: 0 PER 100 WBC
Platelets: 374 10*3/uL (ref 150–400)
RBC: 4.44 M/uL (ref 3.80–5.20)
RDW: 14.4 % (ref 11.5–14.5)
WBC: 10.5 10*3/uL (ref 3.6–11.0)

## 2018-10-26 LAB — POCT GLUCOSE
POC Glucose: 344 mg/dL — ABNORMAL HIGH (ref 65–100)
POC Glucose: 255 mg/dL — ABNORMAL HIGH (ref 65–100)

## 2018-10-26 LAB — EKG 12-LEAD
Atrial Rate: 91 {beats}/min
Diagnosis: NORMAL
P Axis: 27 degrees
P-R Interval: 176 ms
Q-T Interval: 378 ms
QRS Duration: 82 ms
QTc Calculation (Bazett): 464 ms
R Axis: 78 degrees
T Axis: 22 degrees
Ventricular Rate: 91 {beats}/min

## 2018-10-26 LAB — D-DIMER, QUANTITATIVE: D-Dimer, Quant: 0.56 mg/L FEU (ref 0.00–0.65)

## 2018-10-26 LAB — TROPONIN: Troponin I: <0.05 ng/mL (ref <0.05)

## 2018-10-26 LAB — LIPASE
Lipase: 236 U/L (ref 73–393)
Lipase: 236 U/L (ref 73–393)

## 2018-10-26 LAB — CBC WITH AUTOMATED DIFF
ABS. BASOPHILS: 0.1 10*3/uL (ref 0.0–0.1)
ABS. EOSINOPHILS: 0.2 10*3/uL (ref 0.0–0.4)
ABS. IMM. GRANS.: 0.1 10*3/uL — ABNORMAL HIGH (ref 0.00–0.04)
ABS. LYMPHOCYTES: 3.5 10*3/uL (ref 0.8–3.5)
ABS. MONOCYTES: 0.6 10*3/uL (ref 0.0–1.0)
ABS. NEUTROPHILS: 6 10*3/uL (ref 1.8–8.0)
ABSOLUTE NRBC: 0 10*3/uL (ref 0.00–0.01)
BASOPHILS: 1 % (ref 0–1)
EOSINOPHILS: 2 % (ref 0–7)
HCT: 36 % (ref 35.0–47.0)
HGB: 11.6 g/dL (ref 11.5–16.0)
IMMATURE GRANULOCYTES: 1 % — ABNORMAL HIGH (ref 0.0–0.5)
LYMPHOCYTES: 33 % (ref 12–49)
MCH: 26.1 PG (ref 26.0–34.0)
MCHC: 32.2 g/dL (ref 30.0–36.5)
MCV: 81.1 FL (ref 80.0–99.0)
MONOCYTES: 6 % (ref 5–13)
MPV: 11.5 FL (ref 8.9–12.9)
NEUTROPHILS: 57 % (ref 32–75)
NRBC: 0 PER 100 WBC
PLATELET: 374 10*3/uL (ref 150–400)
RBC: 4.44 M/uL (ref 3.80–5.20)
RDW: 14.4 % (ref 11.5–14.5)
WBC: 10.5 10*3/uL (ref 3.6–11.0)

## 2018-10-26 LAB — SAMPLES BEING HELD

## 2018-10-26 LAB — GLUCOSE, POC
Glucose (POC): 344 mg/dL — ABNORMAL HIGH (ref 65–100)
Glucose (POC): 255 mg/dL — ABNORMAL HIGH (ref 65–100)

## 2018-10-26 LAB — EKG, 12 LEAD, INITIAL
Atrial Rate: 91 {beats}/min
Calculated P Axis: 27 degrees
Calculated R Axis: 78 degrees
Calculated T Axis: 22 degrees
Diagnosis: NORMAL
P-R Interval: 176 ms
Q-T Interval: 378 ms
QRS Duration: 82 ms
QTC Calculation (Bezet): 464 ms
Ventricular Rate: 91 {beats}/min

## 2018-10-26 LAB — METABOLIC PANEL, COMPREHENSIVE
A-G Ratio: 0.8 — ABNORMAL LOW (ref 1.1–2.2)
ALT (SGPT): 16 U/L (ref 12–78)
AST (SGOT): 12 U/L — ABNORMAL LOW (ref 15–37)
Albumin: 3.4 g/dL — ABNORMAL LOW (ref 3.5–5.0)
Alk. phosphatase: 65 U/L (ref 45–117)
Anion gap: 8 mmol/L (ref 5–15)
BUN/Creatinine ratio: 15 (ref 12–20)
BUN: 12 MG/DL (ref 6–20)
Bilirubin, total: 0.3 MG/DL (ref 0.2–1.0)
CO2: 26 mmol/L (ref 21–32)
Calcium: 8.6 MG/DL (ref 8.5–10.1)
Chloride: 100 mmol/L (ref 97–108)
Creatinine: 0.82 MG/DL (ref 0.55–1.02)
GFR est AA: >60 mL/min/{1.73_m2} (ref >60)
GFR est non-AA: >60 mL/min/{1.73_m2} (ref >60)
Globulin: 4.2 g/dL — ABNORMAL HIGH (ref 2.0–4.0)
Glucose: 347 mg/dL — ABNORMAL HIGH (ref 65–100)
Potassium: 4 mmol/L (ref 3.5–5.1)
Protein, total: 7.6 g/dL (ref 6.4–8.2)
Sodium: 134 mmol/L — ABNORMAL LOW (ref 136–145)

## 2018-10-26 LAB — D DIMER: D-dimer: 0.56 mg/L FEU (ref 0.00–0.65)

## 2018-10-26 LAB — TROPONIN I: Troponin-I, Qt.: <0.05 ng/mL (ref <0.05)

## 2018-10-26 MED ORDER — METFORMIN 500 MG TAB
500 mg | ORAL | Status: AC
Start: 2018-10-26 — End: 2018-10-26
  Administered 2018-10-26: 05:00:00 via ORAL

## 2018-10-26 MED ORDER — SODIUM CHLORIDE 0.9% BOLUS IV
0.9 % | Freq: Once | INTRAVENOUS | Status: AC
Start: 2018-10-26 — End: 2018-10-26
  Administered 2018-10-26: 04:00:00 via INTRAVENOUS

## 2018-10-26 MED ORDER — ALUM-MAG HYDROXIDE-SIMETH 200 MG-200 MG-20 MG/5 ML ORAL SUSP
200-200-20 mg/5 mL | Freq: Once | ORAL | Status: AC
Start: 2018-10-26 — End: 2018-10-25
  Administered 2018-10-26: 03:00:00 via ORAL

## 2018-10-26 MED ORDER — INSULIN GLARGINE 100 UNIT/ML INJECTION
100 unit/mL | SUBCUTANEOUS | Status: AC
Start: 2018-10-26 — End: 2018-10-26
  Administered 2018-10-26: 05:00:00 via SUBCUTANEOUS

## 2018-10-26 MED ORDER — METFORMIN 500 MG TAB
500 mg | Freq: Two times a day (BID) | ORAL | Status: DC
Start: 2018-10-26 — End: 2018-10-26

## 2018-10-26 MED FILL — INSULIN GLARGINE 100 UNIT/ML INJECTION: 100 unit/mL | SUBCUTANEOUS | Qty: 1

## 2018-10-26 MED FILL — MAG-AL PLUS 200 MG-200 MG-20 MG/5 ML ORAL SUSPENSION: 200-200-20 mg/5 mL | ORAL | Qty: 30

## 2018-10-26 MED FILL — SODIUM CHLORIDE 0.9 % IV: INTRAVENOUS | Qty: 1000

## 2018-10-26 MED FILL — METFORMIN 500 MG TAB: 500 mg | ORAL | Qty: 2

## 2018-10-26 NOTE — ED Notes (Signed)
Patient seen by provider prior to discharge, given discharge papers by RN. Patient with no further questions. Ambulatory to home and no apparent distress.
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
## 2018-10-27 NOTE — Progress Notes (Signed)
CCM progress note    Patient eligible for [place] care management    Received notification of discharge from [facility] on 7/9 for chest pain.  Called nurse access line prior to going to ED.      Contacted patient to discuss post discharge needs and offer care management services. Two patient identifiers verified. Discussed the care management program.  Patient agrees to care management services at this time.       Patient's primary care provider relationship reviewed with patient and modified, as applicable.    Care management assessment completed:    Patient has significant diagnosis of diabetes and migraines.     Cardiac Condition Focused Assessment  Skin- any open wounds or incisions? no  Description of wound- n/a  Edema or swelling? no   If yes, where? n/a  Change in weight >3 lbs in one day, or > 10lbs without explanation? no   If yes, was this reported to provider? no  Recommended goal weight in lbs n/a  Weight at discharge in lbs 270 lbs  Activity level- moving several times a day, or as recommended? yes   Abnormal activity level reported: n/a  Does patient have incentive spirometer? no  If yes, how frequently is patient using incentive spirometer? n/a  Nutrition- prescribed diet? no    Diet type: Regular diet - has not been eating carbs (from Feb to June) A1c dropped to 9.5 from 12.7 in Feb.  Last reported BM: 7/11  Urinating regularly? yes  clear, yellow? yes (more yellow due to vitamins)  Abnormal urine characteristics reported: n/a  Was aspirin or other anticoagulant prescribed? no   Betablocker? no   ACE/ARB? yes   Is patient on anticoagulant therapy? no   If so, any needed lab monitoring needed, but not ordered? no     Red Flags:  Call911 anytime you think you may need emergency care. For example, call if:  You have chest pain or pressure. This may occur with:  Sweating.  Shortness of breath.  Nausea or vomiting.  Pain that spreads from the chest to the neck, jaw, or one or both  shoulders or arms.  Dizziness or lightheadedness.  A fast or uneven pulse.  After calling 911, chew 1 adult-strength aspirin. Wait for an ambulance. Do not try to drive yourself.  You have sudden chest pain and shortness of breath, or you cough up blood.  Call your doctor now or seek immediate medical care if:  You have any trouble breathing.  Your chest pain gets worse.  Your chest pain occurs consistently with exercise and is relieved by rest.  Watch closely for changes in your health, and be sure to contact your doctor if:  Your chest pain does not get better after 1 week.    Medications:  New Medications at Discharge: no  Changed Medications at Discharge: no  Discontinued Medications at Discharge: no  Current Outpatient Medications   Medication Sig   ??? naproxen (NAPROSYN) 500 mg tablet Take 1 Tab by mouth two (2) times daily (with meals).   ??? metFORMIN ER (GLUCOPHAGE XR) 500 mg tablet TAKE 2 TABLETS BY MOUTH DAILY WITH DINNER   ??? lisinopriL (PRINIVIL, ZESTRIL) 10 mg tablet TAKE 1 TABLET BY MOUTH EVERY DAY   ??? rosuvastatin (CRESTOR) 10 mg tablet TAKE 1 TABLET BY MOUTH EVERY DAY AT NIGHT   ??? insulin glargine (Lantus Solostar U-100 Insulin) 100 unit/mL (3 mL) inpn INJECT 74 UNITS UNDER THE SKIN AT BEDTIME   ???  True Metrix Glucose Test Strip strip TEST BLOOD SUGARS 1-3 TIMES DAILY OR AS DIRECTED.   ??? flash glucose sensor (FREESTYLE LIBRE 14 DAY SENSOR) kit Change sensor every 14 days per package instructions E 11.65   ??? TRUE METRIX GLUCOSE METER misc USE TO CHECK BLOOD SUGAR 1 TO 3 TIMES A DAY AS DIRECTED   ??? lancets misc Blood sugar 1-3 times daily as directed by physician E 11.9   ??? miconazole (MICOTIN) 2 % topical cream Apply  to affected area two (2) times a day.   ??? fluticasone propionate (FLONASE) 50 mcg/actuation nasal spray 2 Sprays by Both Nostrils route daily. Indications: PND   ??? cyclobenzaprine (FLEXERIL) 10 mg tablet Take 1 Tab by mouth three (3) times daily as needed for Muscle Spasm(s).    ??? acetaminophen (TYLENOL ARTHRITIS PAIN) 650 mg TbER Take 650 mg by mouth every eight (8) hours.   ??? Blood-Glucose Meter monitoring kit Check blood sugar 1-3 times daily as directed by physician E 11.9   ??? Insulin Needles, Disposable, 31 gauge x 5/16" ndle Use with Lantus Solostar pen     No current facility-administered medications for this visit.        There are no discontinued medications.    Performed medication reconciliation with patient, and patient verbalizes understanding of administration of home medications.  There were no barriers to obtaining medications identified at this time.    Barriers/Support system:  patient and other:  aunt    Barriers/Challenges to Care: _0   Decline in memory    _1   Language barrier     _2   Emotional                  _3   Limited mobility  _4   Lack of motivation     _5  Vision, hearing or cognitive impairment _6   Knowledge _7  Financial Barriers _8   Lack of support  _9   Pain _10   Other _11   None      CM Identified  Problems   1. Potential Knowledge Deficit  2. Follow up  3. Risk for ineffective health    Goals     ??? Completes all follow-up appointments      ? Educate and encourage importance of FU for prevention of complications or disease;  ? Assess the patient's relationship with a PCP and next FU visit scheduled;  o States she will call on Monday to make her FU appt.  ? Discuss importance of adherence to treatment plan and follow up visits;  ? Identify any barriers in transportation or access to FU appointments.   ? Assist patient with making FU appointments as needed;   ? It's also a good idea to know your test results.   ? Keep a list of the medicines you take.      ??? Demonstrates no return to ED or red flags      ?? Assess patient knowledge of how to contact the provider for questions if needed;  ?? Educate patient on importance of monitoring for any red flags;  ?? Denies red flags at this time  ?? Review importance of reporting any changes, ???red flags??? to the provider  and/or PCP;  ?? Assess patient's knowledge of discharge instructions and any restrictions;  ?? Educate patient when to call 911;  ?? Assess for any barriers with safety at home  ?? Discuss use of nurse access line and location of number on front of insurance card.  ?? Utilized NAL for this visit      ???  Demonstrates self-management strategies and behaviors for a healthy lifestylenter goal here      ? Verbalizes importance of a healthy lifestyle and impacts it has on health  o can lower risks for serious health problems such as high blood pressure, heart disease and diabetes;  o can prevent complications or exacerbations of chronic health conditions like Diabetes;  ? Has been on decreased carb diet, A1C down to 9.5 from 12.7 in about 6 months  ? Recognizes importance of completing follow up appointments with PCP and specialists;  ? Discussed healthy diet options   o generally: protein source (deck of cards = 3 ounces), five servings of fruits and vegetables (a piece of fruit or cup of vegetables) with every meal;  ? Sleep 7-8 hours per night;  ? Verbalizes importance of exercise as part of a daily routine             Key pt activities to achieve better health:   _0   Weight loss  _1   Improved diabetic control  _2   Decreased cholesterol levels  _3   Decreased blood pressure  _4     _5        PCP/Specialist follow up: Patient scheduled to follow up with Madelin Rear, MD on   Future Appointments   Date Time Provider [place]   11/22/2018  8:40 AM Prabhakar, Janyce Llanos, MD [place]      Reviewed red flags with patient, and patient verbalizes understanding.  Patient given an opportunity to ask questions. No other clinical/social/functional needs noted.   The patient agrees to contact the PCP office for questions related to their healthcare.  The patient expressed thanks, offered no additional questions and ended the call.      Plan for next call:   Call on 7/20.

## 2018-10-27 NOTE — Progress Notes (Signed)
 CCM progress note    Patient eligible for [employer] care management    Received notification of discharge from [facility] on 7/9 for chest pain.  Called nurse access line prior to going to ED.      Contacted patient to discuss post discharge needs and offer care management services. Two patient identifiers verified. Discussed the care management program.  Patient agrees to care management services at this time.       Patient's primary care provider relationship reviewed with patient and modified, as applicable.    Care management assessment completed:    Patient has significant diagnosis of diabetes and migraines.     Cardiac Condition Focused Assessment  Skin- any open wounds or incisions? no  Description of wound- n/a  Edema or swelling? no   If yes, where? n/a  Change in weight >3 lbs in one day, or > 10lbs without explanation? no   If yes, was this reported to provider? no  Recommended goal weight in lbs n/a  Weight at discharge in lbs 270 lbs  Activity level- moving several times a day, or as recommended? yes   Abnormal activity level reported: n/a  Does patient have incentive spirometer? no  If yes, how frequently is patient using incentive spirometer? n/a  Nutrition- prescribed diet? no    Diet type: Regular diet - has not been eating carbs (from Feb to June) A1c dropped to 9.5 from 12.7 in Feb.  Last reported BM: 7/11  Urinating regularly? yes  clear, yellow? yes (more yellow due to vitamins)  Abnormal urine characteristics reported: n/a  Was aspirin or other anticoagulant prescribed? no   Betablocker? no   ACE/ARB? yes   Is patient on anticoagulant therapy? no   If so, any needed lab monitoring needed, but not ordered? no     Red Flags:  Call911 anytime you think you may need emergency care. For example, call if:  You have chest pain or pressure. This may occur with:  Sweating.  Shortness of breath.  Nausea or vomiting.  Pain that spreads from the chest to the neck, jaw, or one or both shoulders or  arms.  Dizziness or lightheadedness.  A fast or uneven pulse.  After calling 911, chew 1 adult-strength aspirin. Wait for an ambulance. Do not try to drive yourself.  You have sudden chest pain and shortness of breath, or you cough up blood.  Call your doctor now or seek immediate medical care if:  You have any trouble breathing.  Your chest pain gets worse.  Your chest pain occurs consistently with exercise and is relieved by rest.  Watch closely for changes in your health, and be sure to contact your doctor if:  Your chest pain does not get better after 1 week.    Medications:  New Medications at Discharge: no  Changed Medications at Discharge: no  Discontinued Medications at Discharge: no  Current Outpatient Medications   Medication Sig   . naproxen (NAPROSYN) 500 mg tablet Take 1 Tab by mouth two (2) times daily (with meals).   . metFORMIN  ER (GLUCOPHAGE  XR) 500 mg tablet TAKE 2 TABLETS BY MOUTH DAILY WITH DINNER   . lisinopriL  (PRINIVIL , ZESTRIL ) 10 mg tablet TAKE 1 TABLET BY MOUTH EVERY DAY   . rosuvastatin  (CRESTOR ) 10 mg tablet TAKE 1 TABLET BY MOUTH EVERY DAY AT NIGHT   . insulin  glargine (Lantus  Solostar U-100 Insulin ) 100 unit/mL (3 mL) inpn INJECT 74 UNITS UNDER THE SKIN AT BEDTIME   .  True Metrix Glucose Test Strip strip TEST BLOOD SUGARS 1-3 TIMES DAILY OR AS DIRECTED.   . flash glucose sensor (FREESTYLE LIBRE 14 DAY SENSOR) kit Change sensor every 14 days per package instructions E 11.65   . TRUE METRIX GLUCOSE METER misc USE TO CHECK BLOOD SUGAR 1 TO 3 TIMES A DAY AS DIRECTED   . lancets misc Blood sugar 1-3 times daily as directed by physician E 11.9   . miconazole (MICOTIN) 2 % topical cream Apply  to affected area two (2) times a day.   . fluticasone  propionate (FLONASE ) 50 mcg/actuation nasal spray 2 Sprays by Both Nostrils route daily. Indications: PND   . cyclobenzaprine (FLEXERIL) 10 mg tablet Take 1 Tab by mouth three (3) times daily as needed for Muscle Spasm(s).   . acetaminophen  (TYLENOL   ARTHRITIS PAIN) 650 mg TbER Take 650 mg by mouth every eight (8) hours.   . Blood-Glucose Meter monitoring kit Check blood sugar 1-3 times daily as directed by physician E 11.9   . Insulin  Needles, Disposable, 31 gauge x 5/16 ndle Use with Lantus  Solostar pen     No current facility-administered medications for this visit.        There are no discontinued medications.    Performed medication reconciliation with patient, and patient verbalizes understanding of administration of home medications.  There were no barriers to obtaining medications identified at this time.    Barriers/Support system:  patient and other:  aunt    Barriers/Challenges to Care: []   Decline in memory    []   Language barrier     []   Emotional                  []   Limited mobility  []   Lack of motivation     []  Vision, hearing or cognitive impairment []   Knowledge []  Financial Barriers []   Lack of support  []   Pain []   Other [x]   None      CM Identified  Problems   1. Potential Knowledge Deficit  2. Follow up  3. Risk for ineffective health    Goals     . Completes all follow-up appointments      . Educate and encourage importance of FU for prevention of complications or disease;  . Assess the patient's relationship with a PCP and next FU visit scheduled;  o States she will call on Monday to make her FU appt.  . Discuss importance of adherence to treatment plan and follow up visits;  . Identify any barriers in transportation or access to FU appointments.   . Assist patient with making FU appointments as needed;   . It's also a good idea to know your test results.   [name] Keep a list of the medicines you take.      . Demonstrates no return to ED or red flags       Assess patient knowledge of how to contact the provider for questions if needed;   Educate patient on importance of monitoring for any red flags;   Denies red flags at this time   Review importance of reporting any changes, "red flags" to the provider and/or PCP;   Assess patient's  knowledge of discharge instructions and any restrictions;   Educate patient when to call 911;   Assess for any barriers with safety at home   Discuss use of nurse access line and location of number on front of insurance card.   Utilized NAL for this visit      .  Demonstrates self-management strategies and behaviors for a healthy lifestylenter goal here      . Verbalizes importance of a healthy lifestyle and impacts it has on health  o can lower risks for serious health problems such as high blood pressure, heart disease and diabetes;  o can prevent complications or exacerbations of chronic health conditions like Diabetes;  - Has been on decreased carb diet, A1C down to 9.5 from 12.7 in about 6 months  . Recognizes importance of completing follow up appointments with PCP and specialists;  [name] Discussed healthy diet options   o generally: protein source (deck of cards = 3 ounces), five servings of fruits and vegetables (a piece of fruit or cup of vegetables) with every meal;  . Sleep 7-8 hours per night;  [name] [place] importance of exercise as part of a daily routine             Key pt activities to achieve better health:   [x]   Weight loss  [x]   Improved diabetic control  []   Decreased cholesterol levels  []   Decreased blood pressure  []     []        PCP/Specialist follow up: Patient scheduled to follow up with Dillon, Caitlin K, MD on   Future Appointments   Date Time Provider Department Center   11/22/2018  8:40 AM Prabhakar, Tomasa POUR, MD [facility]      Reviewed red flags with patient, and patient verbalizes understanding.  Patient given an opportunity to ask questions. No other clinical/social/functional needs noted.   The patient agrees to contact the PCP office for questions related to their healthcare.  The patient expressed thanks, offered no additional questions and ended the call.      Plan for next call:   Call on 7/20.

## 2018-11-05 NOTE — Progress Notes (Signed)
Follow up phone call to patient, two pt identifiers verified. Discussed patient's goals:   Goals     ??? Completes all follow-up appointments      ? Educate and encourage importance of FU for prevention of complications or disease;  ? Assess the patient's relationship with a PCP and next FU visit scheduled;  o States she will call on Monday to make her FU appt.  ? Discuss importance of adherence to treatment plan and follow up visits;  ? Identify any barriers in transportation or access to FU appointments.   ? Assist patient with making FU appointments as needed;   ? It's also a good idea to know your test results.   ? Keep a list of the medicines you take.  7/20: Patient states she did get an appointment to see her PCP.  Has cardio appt on 8/6.      ??? Demonstrates no return to ED or red flags      ?? Assess patient knowledge of how to contact the provider for questions if needed;  ?? Educate patient on importance of monitoring for any red flags;  ?? Denies red flags at this time  ?? Review importance of reporting any changes, ???red flags??? to the provider and/or PCP;  ?? Assess patient's knowledge of discharge instructions and any restrictions;  ?? Educate patient when to call 911;  ?? Assess for any barriers with safety at home  ?? Discuss use of nurse access line and location of number on front of insurance card.  ?? Utilized NAL for this visit  ?? 7/20: Doing well.  No red flags.      ??? Demonstrates self-management strategies and behaviors for a healthy lifestylenter goal here      ? Verbalizes importance of a healthy lifestyle and impacts it has on health  o can lower risks for serious health problems such as high blood pressure, heart disease and diabetes;  o can prevent complications or exacerbations of chronic health conditions like Diabetes;  ? Has been on decreased carb diet, A1C down to 9.5 from 12.7 in about 6 months  ? Recognizes importance of completing follow up appointments with PCP and specialists;   ? Discussed healthy diet options   o generally: protein source (deck of cards = 3 ounces), five servings of fruits and vegetables (a piece of fruit or cup of vegetables) with every meal;  ? Sleep 7-8 hours per night;  ? Verbalizes importance of exercise as part of a daily routine               Patient's primary care provider relationship reviewed with patient and modified, as applicable.    Readiness to Change: []   Pre-contemplative    []   Contemplative  []   Preparation               [x]   Action                  []   Maintenance    Barriers/Challenges to Care: []   Decline in memory    []   Language barrier     []   Emotional                  []   Limited mobility  []   Lack of motivation     []  Vision, hearing or cognitive impairment []   Knowledge []  Financial Barriers []   Lack of support  []   Pain []   Other [x]   None    Key  pt activities to achieve better health:   [x]   Weight loss  []   Improved diabetic control  []   Decreased cholesterol levels  []   Decreased blood pressure  []     []     Upcoming appointments:   Future Appointments   Date Time Provider [place]   11/22/2018  8:40 AM Margette Fast, MD [place]     Plan for next call: Will call back in 10 days, 7/30.

## 2018-11-05 NOTE — Progress Notes (Signed)
 Follow up phone call to patient, two pt identifiers verified. Discussed patient's goals:   Goals     . Completes all follow-up appointments      . Educate and encourage importance of FU for prevention of complications or disease;  . Assess the patient's relationship with a PCP and next FU visit scheduled;  o States she will call on Monday to make her FU appt.  . Discuss importance of adherence to treatment plan and follow up visits;  . Identify any barriers in transportation or access to FU appointments.   . Assist patient with making FU appointments as needed;   . It's also a good idea to know your test results.   [name] Keep a list of the medicines you take.  7/20: Patient states she did get an appointment to see her PCP.  Has cardio appt on 8/6.      [name] Demonstrates no return to ED or red flags       Assess patient knowledge of how to contact the provider for questions if needed;   Educate patient on importance of monitoring for any red flags;   Denies red flags at this time   Review importance of reporting any changes, "red flags" to the provider and/or PCP;   Assess patient's knowledge of discharge instructions and any restrictions;   Educate patient when to call 911;   Assess for any barriers with safety at home   Discuss use of nurse access line and location of number on front of insurance card.   Utilized NAL for this visit   7/20: Doing well.  No red flags.      . Demonstrates self-management strategies and behaviors for a healthy lifestylenter goal here      . Verbalizes importance of a healthy lifestyle and impacts it has on health  o can lower risks for serious health problems such as high blood pressure, heart disease and diabetes;  o can prevent complications or exacerbations of chronic health conditions like Diabetes;  - Has been on decreased carb diet, A1C down to 9.5 from 12.7 in about 6 months  . Recognizes importance of completing follow up appointments with PCP and specialists;  [name] Discussed  healthy diet options   o generally: protein source (deck of cards = 3 ounces), five servings of fruits and vegetables (a piece of fruit or cup of vegetables) with every meal;  . Sleep 7-8 hours per night;  [name] [place] importance of exercise as part of a daily routine               Patient's primary care provider relationship reviewed with patient and modified, as applicable.    Readiness to Change: []   Pre-contemplative    []   Contemplative  []   Preparation               [x]   Action                  []   Maintenance    Barriers/Challenges to Care: []   Decline in memory    []   Language barrier     []   Emotional                  []   Limited mobility  []   Lack of motivation     []  Vision, hearing or cognitive impairment []   Knowledge []  Financial Barriers []   Lack of support  []   Pain []   Other [x]   None    Key  pt activities to achieve better health:   [x]   Weight loss  []   Improved diabetic control  []   Decreased cholesterol levels  []   Decreased blood pressure  []     []     Upcoming appointments:   Future Appointments   Date Time Provider Department Center   11/22/2018  8:40 AM Raymund Tomasa POUR, MD [facility]     Plan for next call: Will call back in 10 days, 7/30.

## 2018-11-15 NOTE — Progress Notes (Signed)
Follow up phone call to patient, two pt identifiers verified. Discussed patient's goals:   Goals     ??? Completes all follow-up appointments      ? Educate and encourage importance of FU for prevention of complications or disease;  ? Assess the patient's relationship with a PCP and next FU visit scheduled;  o States she will call on Monday to make her FU appt.  ? Discuss importance of adherence to treatment plan and follow up visits;  ? Identify any barriers in transportation or access to FU appointments.   ? Assist patient with making FU appointments as needed;   ? It's also a good idea to know your test results.   ? Keep a list of the medicines you take.  7/20: Patient states she did get an appointment to see her PCP.  Has cardio appt on 8/6.  7/30: Follow up - Cardio 8/6.  Did not see her PCP because her chest pain ended up being muscular.      ??? Demonstrates no return to ED or red flags      ?? Assess patient knowledge of how to contact the provider for questions if needed;  ?? Educate patient on importance of monitoring for any red flags;  ?? Denies red flags at this time  ?? Review importance of reporting any changes, ???red flags??? to the provider and/or PCP;  ?? Assess patient's knowledge of discharge instructions and any restrictions;  ?? Educate patient when to call 911;  ?? Assess for any barriers with safety at home  ?? Discuss use of nurse access line and location of number on front of insurance card.  ?? Utilized NAL for this visit  ?? 7/20: Doing well.  No red flags.      ??? Demonstrates self-management strategies and behaviors for a healthy lifestylenter goal here      ? Verbalizes importance of a healthy lifestyle and impacts it has on health  o can lower risks for serious health problems such as high blood pressure, heart disease and diabetes;  o can prevent complications or exacerbations of chronic health conditions like Diabetes;  ? Has been on decreased carb diet, A1C down to 9.5 from 12.7 in about 6 months   ? Recognizes importance of completing follow up appointments with PCP and specialists;  ? Discussed healthy diet options   o generally: protein source (deck of cards = 3 ounces), five servings of fruits and vegetables (a piece of fruit or cup of vegetables) with every meal;  ? Sleep 7-8 hours per night;  ? Verbalizes importance of exercise as part of a daily routine               Patient's primary care provider relationship reviewed with patient and modified, as applicable.    Readiness to Change: []   Pre-contemplative    []   Contemplative  []   Preparation               [x]   Action                  []   Maintenance    Barriers/Challenges to Care: []   Decline in memory    []   Language barrier     []   Emotional                  []   Limited mobility  []   Lack of motivation     []  Vision, hearing or cognitive impairment []   Knowledge []   Financial Barriers []   Lack of support  []   Pain []   Other [x]   None      Upcoming appointments:   Future Appointments   Date Time Provider [place]   11/22/2018  8:40 AM Prabhakar, Janyce Llanos, MD [facility]     Plan for next call: Will check in on 8/20.

## 2018-11-15 NOTE — Progress Notes (Signed)
Follow up phone call to patient, two pt identifiers verified. Discussed patient's goals:   Goals     . Completes all follow-up appointments      . Educate and encourage importance of FU for prevention of complications or disease;  . Assess the patient's relationship with a PCP and next FU visit scheduled;  o States she will call on Monday to make her FU appt.  . Discuss importance of adherence to treatment plan and follow up visits;  . Identify any barriers in transportation or access to FU appointments.   . Assist patient with making FU appointments as needed;   . It's also a good idea to know your test results.   Marland Kitchen Keep a list of the medicines you take.  7/20: Patient states she did get an appointment to see her PCP.  Has cardio appt on 8/6.  7/30: Follow up - Cardio 8/6.  Did not see her PCP because her chest pain ended up being muscular.      . Demonstrates no return to ED or red flags       Assess patient knowledge of how to contact the provider for questions if needed;   Educate patient on importance of monitoring for any red flags;   Denies red flags at this time   Review importance of reporting any changes, "red flags" to the provider and/or PCP;   Assess patient's knowledge of discharge instructions and any restrictions;   Educate patient when to call 911;   Assess for any barriers with safety at home   Discuss use of nurse access line and location of number on front of insurance card.   Utilized NAL for this visit   7/20: Doing well.  No red flags.      . Demonstrates self-management strategies and behaviors for a healthy lifestylenter goal here      . Verbalizes importance of a healthy lifestyle and impacts it has on health  o can lower risks for serious health problems such as high blood pressure, heart disease and diabetes;  o can prevent complications or exacerbations of chronic health conditions like Diabetes;  - Has been on decreased carb diet, A1C down to 9.5 from 12.7 in about 6  months  . Recognizes importance of completing follow up appointments with PCP and specialists;  Marland Kitchen Discussed healthy diet options   o generally: protein source (deck of cards = 3 ounces), five servings of fruits and vegetables (a piece of fruit or cup of vegetables) with every meal;  . Sleep 7-8 hours per night;  Trenton Gammon importance of exercise as part of a daily routine               Patient's primary care provider relationship reviewed with patient and modified, as applicable.    Readiness to Change: []   Pre-contemplative    []   Contemplative  []   Preparation               [x]   Action                  []   Maintenance    Barriers/Challenges to Care: []   Decline in memory    []   Language barrier     []   Emotional                  []   Limited mobility  []   Lack of motivation     []  Vision, hearing or cognitive impairment []   Knowledge []   Financial Barriers []   Lack of support  []   Pain []   Other [x]   None      Upcoming appointments:   Future Appointments   Date Time Provider Department Center   11/22/2018  8:40 AM Prabhakar, Sharion Balloon, MD [facility]     Plan for next call: Will check in on 8/20.

## 2018-11-22 ENCOUNTER — Encounter: Attending: Internal Medicine | Primary: Family Medicine

## 2018-11-22 ENCOUNTER — Encounter

## 2018-11-22 ENCOUNTER — Ambulatory Visit: Attending: Internal Medicine | Primary: Family Medicine

## 2018-11-22 MED ORDER — ROSUVASTATIN 10 MG TAB
10 mg | ORAL_TABLET | ORAL | 0 refills | Status: DC
Start: 2018-11-22 — End: 2019-01-29

## 2018-11-22 MED ORDER — LISINOPRIL 10 MG TAB
10 mg | ORAL_TABLET | ORAL | 0 refills | Status: DC
Start: 2018-11-22 — End: 2019-01-29

## 2018-11-22 MED ORDER — METFORMIN SR 500 MG 24 HR TABLET
500 mg | ORAL_TABLET | ORAL | 0 refills | Status: DC
Start: 2018-11-22 — End: 2018-12-19

## 2018-11-22 NOTE — Progress Notes (Deleted)
No show

## 2018-12-06 NOTE — Progress Notes (Signed)
Follow up phone call to patient, two pt identifiers verified. Discussed patient's goals:   Goals     ??? Completes all follow-up appointments      ? Educate and encourage importance of FU for prevention of complications or disease;  ? Assess the patient's relationship with a PCP and next FU visit scheduled;  o States she will call on Monday to make her FU appt.  ? Discuss importance of adherence to treatment plan and follow up visits;  ? Identify any barriers in transportation or access to FU appointments.   ? Assist patient with making FU appointments as needed;   ? It's also a good idea to know your test results.   ? Keep a list of the medicines you take.  7/20: Patient states she did get an appointment to see her PCP.  Has cardio appt on 8/6.  7/30: Follow up - Cardio 8/6.  Did not see her PCP because her chest pain ended up being muscular.  8/20: Follow completed.  Now seeing a chiropractor for back pain.      ??? Demonstrates self-management strategies and behaviors for a healthy lifestylenter goal here      ? Verbalizes importance of a healthy lifestyle and impacts it has on health  o can lower risks for serious health problems such as high blood pressure, heart disease and diabetes;  o can prevent complications or exacerbations of chronic health conditions like Diabetes;  ? Has been on decreased carb diet, A1C down to 9.5 from 12.7 in about 6 months  ? Recognizes importance of completing follow up appointments with PCP and specialists;  ? Discussed healthy diet options   o generally: protein source (deck of cards = 3 ounces), five servings of fruits and vegetables (a piece of fruit or cup of vegetables) with every meal;  ? Sleep 7-8 hours per night;  ? Verbalizes importance of exercise as part of a daily routine   8/20: She hurt her back at work.  Did not file worker's comp claim because she didn't want to be told she would have to do covid testing to come back  to work.  She is seeing a chiropractor regularly and is icing for 12 minutes every three to four.              Patient's primary care provider relationship reviewed with patient and modified, as applicable.    Readiness to Change: []   Pre-contemplative    []   Contemplative  []   Preparation               [x]   Action                  []   Maintenance    Barriers/Challenges to Care: []   Decline in memory    []   Language barrier     []   Emotional                  []   Limited mobility  []   Lack of motivation     []  Vision, hearing or cognitive impairment []   Knowledge []  Financial Barriers []   Lack of support  []   Pain []   Other [x]   None    Key pt activities to achieve better health:   [x]   Weight loss  []   Improved diabetic control  []   Decreased cholesterol levels  []   Decreased blood pressure  []     []     Plan for next call: Follow up in  three weeks, 9/10.

## 2018-12-06 NOTE — Progress Notes (Signed)
Follow up phone call to patient, two pt identifiers verified. Discussed patient's goals:   Goals     . Completes all follow-up appointments      . Educate and encourage importance of FU for prevention of complications or disease;  . Assess the patient's relationship with a PCP and next FU visit scheduled;  o States she will call on Monday to make her FU appt.  . Discuss importance of adherence to treatment plan and follow up visits;  . Identify any barriers in transportation or access to FU appointments.   . Assist patient with making FU appointments as needed;   . It's also a good idea to know your test results.   Marland Kitchen Keep a list of the medicines you take.  7/20: Patient states she did get an appointment to see her PCP.  Has cardio appt on 8/6.  7/30: Follow up - Cardio 8/6.  Did not see her PCP because her chest pain ended up being muscular.  8/20: Follow completed.  Now seeing a chiropractor for back pain.      . Demonstrates self-management strategies and behaviors for a healthy lifestylenter goal here      . Verbalizes importance of a healthy lifestyle and impacts it has on health  o can lower risks for serious health problems such as high blood pressure, heart disease and diabetes;  o can prevent complications or exacerbations of chronic health conditions like Diabetes;  - Has been on decreased carb diet, A1C down to 9.5 from 12.7 in about 6 months  . Recognizes importance of completing follow up appointments with PCP and specialists;  Marland Kitchen Discussed healthy diet options   o generally: protein source (deck of cards = 3 ounces), five servings of fruits and vegetables (a piece of fruit or cup of vegetables) with every meal;  . Sleep 7-8 hours per night;  Trenton Gammon importance of exercise as part of a daily routine   8/20: She hurt her back at work.  Did not file worker's comp claim because she didn't want to be told she would have to do covid testing to come back to work.  She is seeing a chiropractor regularly and  is icing for 12 minutes every three to four.              Patient's primary care provider relationship reviewed with patient and modified, as applicable.    Readiness to Change: []   Pre-contemplative    []   Contemplative  []   Preparation               [x]   Action                  []   Maintenance    Barriers/Challenges to Care: []   Decline in memory    []   Language barrier     []   Emotional                  []   Limited mobility  []   Lack of motivation     []  Vision, hearing or cognitive impairment []   Knowledge []  Financial Barriers []   Lack of support  []   Pain []   Other [x]   None    Key pt activities to achieve better health:   [x]   Weight loss  []   Improved diabetic control  []   Decreased cholesterol levels  []   Decreased blood pressure  []     []     Plan for next call: Follow up in  three weeks, 9/10.

## 2018-12-19 MED ORDER — METFORMIN SR 500 MG 24 HR TABLET
500 mg | ORAL_TABLET | ORAL | 0 refills | Status: DC
Start: 2018-12-19 — End: 2019-01-29

## 2018-12-19 NOTE — Telephone Encounter (Signed)
From: [name]  To: Madelin Rear, MD  Sent: 12/19/2018 9:29 AM EDT  Subject: Prescription Question    I received a letter from [facility] informing me that the Metformin had been recalled. Can I safely stop taking it without any adverse medical reaction and if so what can I replace the Metformin with? Also I see on my my chart that I need my A1C checked and an updated Tdap?

## 2018-12-27 NOTE — Progress Notes (Signed)
Attempt to reach patient for follow up. Discreet VM left with contact information. Will follow up on 9/24.

## 2019-01-10 NOTE — Progress Notes (Signed)
Follow up phone call to patient, two pt identifiers verified. Discussed patient's goals:   Goals     ??? Completes all follow-up appointments      ? Educate and encourage importance of FU for prevention of complications or disease;  ? Assess the patient's relationship with a PCP and next FU visit scheduled;  o States she will call on Monday to make her FU appt.  ? Discuss importance of adherence to treatment plan and follow up visits;  ? Identify any barriers in transportation or access to FU appointments.   ? Assist patient with making FU appointments as needed;   ? It's also a good idea to know your test results.   ? Keep a list of the medicines you take.  7/20: Patient states she did get an appointment to see her PCP.  Has cardio appt on 8/6.  7/30: Follow up - Cardio 8/6.  Did not see her PCP because her chest pain ended up being muscular.  8/20: Follow completed.  Now seeing a chiropractor for back pain.  9/24: PCP in six weeks.        ??? Demonstrates self-management strategies and behaviors for a healthy lifestylenter goal here      ? Verbalizes importance of a healthy lifestyle and impacts it has on health  o can lower risks for serious health problems such as high blood pressure, heart disease and diabetes;  o can prevent complications or exacerbations of chronic health conditions like Diabetes;  ? Has been on decreased carb diet, A1C down to 9.5 from 12.7 in about 6 months  ? Recognizes importance of completing follow up appointments with PCP and specialists;  ? Discussed healthy diet options   o generally: protein source (deck of cards = 3 ounces), five servings of fruits and vegetables (a piece of fruit or cup of vegetables) with every meal;  ? Sleep 7-8 hours per night;  ? Verbalizes importance of exercise as part of a daily routine   8/20: She hurt her back at work.  Did not file worker's comp claim because she didn't want to be told she would have to do covid testing to come back  to work.  She is seeing a chiropractor regularly and is icing for 12 minutes every three to four.  9/24: Patient described meals she is eating - high protein, low carbs - Breakfast - eggs, cheese and shrimp or salmon, healthy snacks for lunch, dinner ground beef w/ cabbage and potatoes.  Drinking water excessively, average 130 ml/day.  Her provider is aware.  Plans to go to her PCP for A1C check in about 6 weeks.              Patient's primary care provider relationship reviewed with patient and modified, as applicable.    Readiness to Change: []   Pre-contemplative    []   Contemplative  []   Preparation               [x]   Action                  []   Maintenance    Barriers/Challenges to Care: []   Decline in memory    []   Language barrier     []   Emotional                  []   Limited mobility  []   Lack of motivation     []  Vision, hearing or cognitive impairment []   Knowledge []   Financial Barriers []   Lack of support  []   Pain []   Other [x]   None    Key pt activities to achieve better health:   [x]   Weight loss  [x]   Improved diabetic control  []   Decreased cholesterol levels  []   Decreased blood pressure  []     []     Upcoming appointments: PCP in six weeks, last week of October.  Plan for next call: Two weeks, 10/8

## 2019-01-10 NOTE — Progress Notes (Signed)
 Follow up phone call to patient, two pt identifiers verified. Discussed patient's goals:   Goals     . Completes all follow-up appointments      . Educate and encourage importance of FU for prevention of complications or disease;  . Assess the patient's relationship with a PCP and next FU visit scheduled;  o States she will call on Monday to make her FU appt.  . Discuss importance of adherence to treatment plan and follow up visits;  . Identify any barriers in transportation or access to FU appointments.   . Assist patient with making FU appointments as needed;   . It's also a good idea to know your test results.   [name] Keep a list of the medicines you take.  7/20: Patient states she did get an appointment to see her PCP.  Has cardio appt on 8/6.  7/30: Follow up - Cardio 8/6.  Did not see her PCP because her chest pain ended up being muscular.  8/20: Follow completed.  Now seeing a chiropractor for back pain.  9/24: PCP in six weeks.        . Demonstrates self-management strategies and behaviors for a healthy lifestylenter goal here      . Verbalizes importance of a healthy lifestyle and impacts it has on health  o can lower risks for serious health problems such as high blood pressure, heart disease and diabetes;  o can prevent complications or exacerbations of chronic health conditions like Diabetes;  - Has been on decreased carb diet, A1C down to 9.5 from 12.7 in about 6 months  . Recognizes importance of completing follow up appointments with PCP and specialists;  [name] Discussed healthy diet options   o generally: protein source (deck of cards = 3 ounces), five servings of fruits and vegetables (a piece of fruit or cup of vegetables) with every meal;  . Sleep 7-8 hours per night;  [name] [place] importance of exercise as part of a daily routine   8/20: She hurt her back at work.  Did not file worker's comp claim because she didn't want to be told she would have to do covid testing to come back to work.  She is seeing a  chiropractor regularly and is icing for 12 minutes every three to four.  9/24: Patient described meals she is eating - high protein, low carbs - Breakfast - eggs, cheese and shrimp or salmon, healthy snacks for lunch, dinner ground beef w/ cabbage and potatoes.  Drinking water  excessively, average 130 ml/day.  Her provider is aware.  Plans to go to her PCP for A1C check in about 6 weeks.              Patient's primary care provider relationship reviewed with patient and modified, as applicable.    Readiness to Change: []   Pre-contemplative    []   Contemplative  []   Preparation               [x]   Action                  []   Maintenance    Barriers/Challenges to Care: []   Decline in memory    []   Language barrier     []   Emotional                  []   Limited mobility  []   Lack of motivation     []  Vision, hearing or cognitive impairment []   Knowledge []   Financial Barriers []   Lack of support  []   Pain []   Other [x]   None    Key pt activities to achieve better health:   [x]   Weight loss  [x]   Improved diabetic control  []   Decreased cholesterol levels  []   Decreased blood pressure  []     []     Upcoming appointments: PCP in six weeks, last week of October.  Plan for next call: Two weeks, 10/8

## 2019-01-20 NOTE — Telephone Encounter (Signed)
On Call Note:     Reports headache x 2 days now with nausea and abdominal heaviness. Has h/o diverticulitis and symptoms are similar to previous episode. Overall does not feel well. Recommend urgent care evaluation and patient verbalizes understanding.     Shellia Carwin, MD

## 2019-01-24 NOTE — Progress Notes (Signed)
Follow up phone call to patient, two pt identifiers verified. Discussed patient's goals:   Goals     ??? Completes all follow-up appointments      ? Educate and encourage importance of FU for prevention of complications or disease;  ? Assess the patient's relationship with a PCP and next FU visit scheduled;  o States she will call on Monday to make her FU appt.  ? Discuss importance of adherence to treatment plan and follow up visits;  ? Identify any barriers in transportation or access to FU appointments.   ? Assist patient with making FU appointments as needed;   ? It's also a good idea to know your test results.   ? Keep a list of the medicines you take.  7/20: Patient states she did get an appointment to see her PCP.  Has cardio appt on 8/6.  7/30: Follow up - Cardio 8/6.  Did not see her PCP because her chest pain ended up being muscular.  8/20: Follow completed.  Now seeing a chiropractor for back pain.  9/24: PCP in six weeks.        ??? Demonstrates self-management strategies and behaviors for a healthy lifestylenter goal here      ? Verbalizes importance of a healthy lifestyle and impacts it has on health  o can lower risks for serious health problems such as high blood pressure, heart disease and diabetes;  o can prevent complications or exacerbations of chronic health conditions like Diabetes;  ? Has been on decreased carb diet, A1C down to 9.5 from 12.7 in about 6 months  ? Recognizes importance of completing follow up appointments with PCP and specialists;  ? Discussed healthy diet options   o generally: protein source (deck of cards = 3 ounces), five servings of fruits and vegetables (a piece of fruit or cup of vegetables) with every meal;  ? Sleep 7-8 hours per night;  ? Verbalizes importance of exercise as part of a daily routine   8/20: She hurt her back at work.  Did not file worker's comp claim because she didn't want to be told she would have to do covid testing to come back  to work.  She is seeing a chiropractor regularly and is icing for 12 minutes every three to four.  9/24: Patient described meals she is eating - high protein, low carbs - Breakfast - eggs, cheese and shrimp or salmon, healthy snacks for lunch, dinner ground beef w/ cabbage and potatoes.  Drinking water excessively, average 130 ml/day.  Her provider is aware.  Plans to go to her PCP for A1C check in about 6 weeks.  10/8: States she had salmon and eggs this morning for breakfast. States diet has been going well.  Leaves to go to [place], [place] on 10/16 to visit family.  Encouraged her to plan meals.                Patient's primary care provider relationship reviewed with patient and modified, as applicable.      Readiness to Change: []   Pre-contemplative    []   Contemplative  []   Preparation               [x]   Action                  []   Maintenance    Barriers/Challenges to Care: []   Decline in memory    []   Language barrier     []   Emotional                  []   Limited mobility  []   Lack of motivation     []  Vision, hearing or cognitive impairment []   Knowledge []  Financial Barriers []   Lack of support  []   Pain []   Other [x]   None    Key pt activities to achieve better health:   [x]   Weight loss  [x]   Improved diabetic control  []   Decreased cholesterol levels  []   Decreased blood pressure  []     []       Plan for next call: 11/5.  Check in

## 2019-01-24 NOTE — Progress Notes (Signed)
Follow up phone call to patient, two pt identifiers verified. Discussed patient's goals:   Goals     . Completes all follow-up appointments      . Educate and encourage importance of FU for prevention of complications or disease;  . Assess the patient's relationship with a PCP and next FU visit scheduled;  o States she will call on Monday to make her FU appt.  . Discuss importance of adherence to treatment plan and follow up visits;  . Identify any barriers in transportation or access to FU appointments.   . Assist patient with making FU appointments as needed;   . It's also a good idea to know your test results.   Marland Kitchen Keep a list of the medicines you take.  7/20: Patient states she did get an appointment to see her PCP.  Has cardio appt on 8/6.  7/30: Follow up - Cardio 8/6.  Did not see her PCP because her chest pain ended up being muscular.  8/20: Follow completed.  Now seeing a chiropractor for back pain.  9/24: PCP in six weeks.        . Demonstrates self-management strategies and behaviors for a healthy lifestylenter goal here      . Verbalizes importance of a healthy lifestyle and impacts it has on health  o can lower risks for serious health problems such as high blood pressure, heart disease and diabetes;  o can prevent complications or exacerbations of chronic health conditions like Diabetes;  - Has been on decreased carb diet, A1C down to 9.5 from 12.7 in about 6 months  . Recognizes importance of completing follow up appointments with PCP and specialists;  Marland Kitchen Discussed healthy diet options   o generally: protein source (deck of cards = 3 ounces), five servings of fruits and vegetables (a piece of fruit or cup of vegetables) with every meal;  . Sleep 7-8 hours per night;  Trenton Gammon importance of exercise as part of a daily routine   8/20: She hurt her back at work.  Did not file worker's comp claim because she didn't want to be told she would have to do covid testing to come back to work.  She is seeing a  chiropractor regularly and is icing for 12 minutes every three to four.  9/24: Patient described meals she is eating - high protein, low carbs - Breakfast - eggs, cheese and shrimp or salmon, healthy snacks for lunch, dinner ground beef w/ cabbage and potatoes.  Drinking water excessively, average 130 ml/day.  Her provider is aware.  Plans to go to her PCP for A1C check in about 6 weeks.  10/8: States she had salmon and eggs this morning for breakfast. States diet has been going well.  Leaves to go to [place], [place] on 10/16 to visit family.  Encouraged her to plan meals.                Patient's primary care provider relationship reviewed with patient and modified, as applicable.      Readiness to Change: []   Pre-contemplative    []   Contemplative  []   Preparation               [x]   Action                  []   Maintenance    Barriers/Challenges to Care: []   Decline in memory    []   Language barrier     []   Emotional                  []   Limited mobility  []   Lack of motivation     []  Vision, hearing or cognitive impairment []   Knowledge []  Financial Barriers []   Lack of support  []   Pain []   Other [x]   None    Key pt activities to achieve better health:   [x]   Weight loss  [x]   Improved diabetic control  []   Decreased cholesterol levels  []   Decreased blood pressure  []     []       Plan for next call: 11/5.  Check in

## 2019-01-28 NOTE — Telephone Encounter (Signed)
-----   Message from [name] sent at 01/27/2019  3:58 AM EDT -----  Regarding: Prescription Question  Contact: [phone]  I tried to get my prescriptions before I go out of town but other [facility] doesn't have my prescriptions on file other. I don't see the prescription for lantus needles either when I tried to get them filled at [facility] said that I need prescriptions on file with them but also for emergencies I need to have them on file at [facility] as well.

## 2019-01-28 NOTE — Telephone Encounter (Signed)
-----   Message from [name] sent at 01/27/2019  3:58 AM EDT -----  Regarding: Prescription Question  Contact: [phone]  I tried to get my prescriptions before I go out of town but other [facility] doesn't have my prescriptions on file other. I don't see the prescription for lantus needles either when I tried to get them filled at [facility] said that I need prescriptions on file with them but also for emergencies I need to have them on file at [facility] as well.

## 2019-01-29 ENCOUNTER — Encounter

## 2019-01-29 MED ORDER — ROSUVASTATIN 10 MG TAB
10 mg | ORAL_TABLET | ORAL | 0 refills | Status: DC
Start: 2019-01-29 — End: 2019-03-30
  Filled 2019-02-05: qty 90, 90d supply, fill #0

## 2019-01-29 MED ORDER — INSULIN NEEDLES (DISPOSABLE) 31 X 5/16"
31 gauge x 5/16" | PACK | 11 refills | Status: DC
Start: 2019-01-29 — End: 2019-08-14

## 2019-01-29 MED ORDER — METFORMIN SR 500 MG 24 HR TABLET
500 mg | ORAL_TABLET | ORAL | 0 refills | Status: DC
Start: 2019-01-29 — End: 2019-03-30
  Filled 2019-02-05: qty 180, 90d supply, fill #0

## 2019-01-29 MED ORDER — LANTUS SOLOSTAR U-100 INSULIN 100 UNIT/ML (3 ML) SUBCUTANEOUS PEN
100 unit/mL (3 mL) | SUBCUTANEOUS | 5 refills | Status: DC
Start: 2019-01-29 — End: 2019-05-16

## 2019-01-29 MED ORDER — LISINOPRIL 10 MG TAB
10 mg | ORAL_TABLET | ORAL | 0 refills | Status: DC
Start: 2019-01-29 — End: 2019-03-30
  Filled 2019-02-05: qty 90, 90d supply, fill #0

## 2019-01-29 MED ORDER — FREESTYLE LIBRE 14 DAY SENSOR KIT
PACK | 3 refills | Status: DC
Start: 2019-01-29 — End: 2019-05-01

## 2019-01-29 MED ORDER — INSULIN NEEDLES (DISPOSABLE) 31 X 5/16"
31 gauge x 5/16" | PACK | 11 refills | Status: DC
Start: 2019-01-29 — End: 2019-05-15
  Filled 2019-01-31: qty 60, 80d supply, fill #0

## 2019-01-29 NOTE — Telephone Encounter (Signed)
-----   Message from [name] sent at 01/28/2019  6:17 PM EDT -----  Regarding: RE: Prescription Question  Contact: [phone]    Metformin HCL ER 500 mg Tab  Lisinopril 10 mg Tab  Rosuvastatin Calcium 10 mg  Lantus 100 unit pen & needles for lantus injections  Freestyle Libre 14 day sensor    Thank you  ----- Message -----  From: Remi Haggard Phyliss Hulick  Sent: 01/28/19 4:46 PM  To: [name]  Subject: RE: Prescription Question    Good Afternoon,   Which medication/medications need to be refilled?  Thank you,   Lexine Baton       ----- Message -----       From:Rosalin Gaspar Bidding       Sent:01/28/2019  4:44 PM EDT         YE:[id number] Etta Grandchild, MD    Subject:Prescription Question    Running out of medicine and there is none on fill for me at [facility]

## 2019-02-05 MED FILL — LEADER PEN NEEDLE 31G X 8MM (5/16""): 90 days supply | Qty: 300 | Fill #0 | Status: AC

## 2019-02-21 NOTE — Progress Notes (Signed)
Attempt to reach patient for follow up. Discreet VM left with contact information. Will call back in two weeks, 11/19.

## 2019-03-07 NOTE — Progress Notes (Signed)
Follow up phone call to patient, two pt identifiers verified. Discussed patient's goals:   Goals     ??? Completes all follow-up appointments      ? Educate and encourage importance of FU for prevention of complications or disease;  ? Assess the patient's relationship with a PCP and next FU visit scheduled;  o States she will call on Monday to make her FU appt.  ? Discuss importance of adherence to treatment plan and follow up visits;  ? Identify any barriers in transportation or access to FU appointments.   ? Assist patient with making FU appointments as needed;   ? It's also a good idea to know your test results.   ? Keep a list of the medicines you take.  7/20: Patient states she did get an appointment to see her PCP.  Has cardio appt on 8/6.  7/30: Follow up - Cardio 8/6.  Did not see her PCP because her chest pain ended up being muscular.  8/20: Follow completed.  Now seeing a chiropractor for back pain.  9/24: PCP in six weeks.    11/19: She has not made a f/up yet because of covid.  Has an elderly aunt who lives with her and does not want to expose any of her family members.  Encouraged her to continue to follow healthy diet.      ??? Demonstrates self-management strategies and behaviors for a healthy lifestylenter goal here      ? Verbalizes importance of a healthy lifestyle and impacts it has on health  o can lower risks for serious health problems such as high blood pressure, heart disease and diabetes;  o can prevent complications or exacerbations of chronic health conditions like Diabetes;  ? Has been on decreased carb diet, A1C down to 9.5 from 12.7 in about 6 months  ? Recognizes importance of completing follow up appointments with PCP and specialists;  ? Discussed healthy diet options   o generally: protein source (deck of cards = 3 ounces), five servings of fruits and vegetables (a piece of fruit or cup of vegetables) with every meal;  ? Sleep 7-8 hours per night;   ? Verbalizes importance of exercise as part of a daily routine   8/20: She hurt her back at work.  Did not file worker's comp claim because she didn't want to be told she would have to do covid testing to come back to work.  She is seeing a chiropractor regularly and is icing for 12 minutes every three to four.  9/24: Patient described meals she is eating - high protein, low carbs - Breakfast - eggs, cheese and shrimp or salmon, healthy snacks for lunch, dinner ground beef w/ cabbage and potatoes.  Drinking water excessively, average 130 ml/day.  Her provider is aware.  Plans to go to her PCP for A1C check in about 6 weeks.  10/8: States she had salmon and eggs this morning for breakfast. States diet has been going well.  Leaves to go to [place], [place] on 10/16 to visit family.  Encouraged her to plan meals.    11/19: Patient states she just had an eye appointment and eyes are healthy.  No effects of diabetes.  She is still maintaining a healthy diet.  States she is eating egg and shrimp for breakfast.  States her son is becoming engaged in better diet.               Patient's primary care provider relationship reviewed with  patient and modified, as applicable.    Readiness to Change: []   Pre-contemplative    []   Contemplative  []   Preparation               [x]   Action                  []   Maintenance    Barriers/Challenges to Care: []   Decline in memory    []   Language barrier     []   Emotional                  []   Limited mobility  []   Lack of motivation     []  Vision, hearing or cognitive impairment []   Knowledge []  Financial Barriers []   Lack of support  []   Pain []   Other [x]   None    Key pt activities to achieve better health:   [x]   Weight loss  [x]   Improved diabetic control  []   Decreased cholesterol levels  []   Decreased blood pressure  []     []     Plan for next call: Requested a call back in one month, 12/17.

## 2019-03-07 NOTE — Progress Notes (Signed)
 Follow up phone call to patient, two pt identifiers verified. Discussed patient's goals:   Goals     . Completes all follow-up appointments      . Educate and encourage importance of FU for prevention of complications or disease;  . Assess the patient's relationship with a PCP and next FU visit scheduled;  o States she will call on Monday to make her FU appt.  . Discuss importance of adherence to treatment plan and follow up visits;  . Identify any barriers in transportation or access to FU appointments.   . Assist patient with making FU appointments as needed;   . It's also a good idea to know your test results.   [name] Keep a list of the medicines you take.  7/20: Patient states she did get an appointment to see her PCP.  Has cardio appt on 8/6.  7/30: Follow up - Cardio 8/6.  Did not see her PCP because her chest pain ended up being muscular.  8/20: Follow completed.  Now seeing a chiropractor for back pain.  9/24: PCP in six weeks.    11/19: She has not made a f/up yet because of covid.  Has an elderly aunt who lives with her and does not want to expose any of her family members.  Encouraged her to continue to follow healthy diet.      . Demonstrates self-management strategies and behaviors for a healthy lifestylenter goal here      . Verbalizes importance of a healthy lifestyle and impacts it has on health  o can lower risks for serious health problems such as high blood pressure, heart disease and diabetes;  o can prevent complications or exacerbations of chronic health conditions like Diabetes;  - Has been on decreased carb diet, A1C down to 9.5 from 12.7 in about 6 months  . Recognizes importance of completing follow up appointments with PCP and specialists;  [name] Discussed healthy diet options   o generally: protein source (deck of cards = 3 ounces), five servings of fruits and vegetables (a piece of fruit or cup of vegetables) with every meal;  . Sleep 7-8 hours per night;  [name] [place] importance of exercise as  part of a daily routine   8/20: She hurt her back at work.  Did not file worker's comp claim because she didn't want to be told she would have to do covid testing to come back to work.  She is seeing a chiropractor regularly and is icing for 12 minutes every three to four.  9/24: Patient described meals she is eating - high protein, low carbs - Breakfast - eggs, cheese and shrimp or salmon, healthy snacks for lunch, dinner ground beef w/ cabbage and potatoes.  Drinking water  excessively, average 130 ml/day.  Her provider is aware.  Plans to go to her PCP for A1C check in about 6 weeks.  10/8: States she had salmon and eggs this morning for breakfast. States diet has been going well.  Leaves to go to [place], [place] on 10/16 to visit family.  Encouraged her to plan meals.    11/19: Patient states she just had an eye appointment and eyes are healthy.  No effects of diabetes.  She is still maintaining a healthy diet.  States she is eating egg and shrimp for breakfast.  States her son is becoming engaged in better diet.               Patient's primary care provider relationship reviewed with  patient and modified, as applicable.    Readiness to Change: []   Pre-contemplative    []   Contemplative  []   Preparation               [x]   Action                  []   Maintenance    Barriers/Challenges to Care: []   Decline in memory    []   Language barrier     []   Emotional                  []   Limited mobility  []   Lack of motivation     []  Vision, hearing or cognitive impairment []   Knowledge []  Financial Barriers []   Lack of support  []   Pain []   Other [x]   None    Key pt activities to achieve better health:   [x]   Weight loss  [x]   Improved diabetic control  []   Decreased cholesterol levels  []   Decreased blood pressure  []     []     Plan for next call: Requested a call back in one month, 12/17.

## 2019-03-20 ENCOUNTER — Ambulatory Visit: Attending: Obstetrics & Gynecology | Primary: Family Medicine

## 2019-03-20 ENCOUNTER — Ambulatory Visit
Admit: 2019-03-20 | Discharge: 2019-03-20 | Payer: MEDICAID | Attending: Obstetrics & Gynecology | Primary: Family Medicine

## 2019-03-20 DIAGNOSIS — N644 Mastodynia: Secondary | ICD-10-CM
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
## 2019-03-20 NOTE — Patient Instructions (Signed)
Breast Pain: Care Instructions  Your Care Instructions     Breast tenderness and pain may come and go with your monthly periods (cyclic), or it may not follow any pattern (noncyclic). Breast pain is rarely caused by a serious health problem. You may need tests to find the cause.  Follow-up care is a key part of your treatment and safety. Be sure to make and go to all appointments, and call your doctor if you are having problems. It's also a good idea to know your test results and keep a list of the medicines you take.  How can you care for yourself at home?  ?? If your doctor gave you medicine, take it exactly as prescribed. Call your doctor if you think you are having a problem with your medicine.  ?? Take an over-the-counter pain medicine, such as acetaminophen (Tylenol), ibuprofen (Advil, Motrin), or naproxen (Aleve), to relieve pain and swelling. Read and follow all instructions on the label.  ?? Do not take two or more pain medicines at the same time unless the doctor told you to. Many pain medicines have acetaminophen, which is Tylenol. Too much acetaminophen (Tylenol) can be harmful.  ?? Wear a supportive bra, such as a sports bra or a jog bra.  ?? Cut down on the amount of fat in your diet. If you need help planning healthy meals, see a dietitian.  ?? Get at least 30 minutes of exercise on most days of the week. Walking is a good choice. You also may want to do other activities, such as running, swimming, cycling, or playing tennis or team sports.  ?? Keep a healthy sleep pattern. Go to bed at the same time every night, and get up at the same time every day.  When should you call for help?   Call your doctor now or seek immediate medical care if:  ?? ?? You have new changes in a breast, such as:  ? A lump or thickening in your breast or armpit.  ? A change in the breast's size or shape.  ? Skin changes, such as dimples or puckers.  ? Nipple discharge.   ? A change in the color or feel of the skin of your breast or the darker area around the nipple (areola).  ? A change in the shape of the nipple (it may look like it's being pulled into the breast).   ?? ?? You have symptoms of a breast infection, such as:  ? Increased pain, swelling, redness, or warmth around a breast.  ? Red streaks extending from the breast.  ? Pus draining from a breast.  ? A fever.   Watch closely for changes in your health, and be sure to contact your doctor if:  ?? ?? Your breast pain does not get better after 1 week.   ?? ?? You have a lump or thickening in your breast or armpit.   Where can you learn more?  Go to http://[facility]-rivera.info/  Enter Z395 in the search box to learn more about "Breast Pain: Care Instructions."  Current as of: October 11, 2017??????????????????????????????Content Version: 12.6  ?? 2006-2020 [employer].   Care instructions adapted under license by [employer] (which disclaims liability or warranty for this information). If you have questions about a medical condition or this instruction, always ask your healthcare professional. [place] any warranty or liability for your use of this information.
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
## 2019-03-29 ENCOUNTER — Encounter

## 2019-03-30 MED ORDER — LISINOPRIL 10 MG TAB
10 mg | ORAL_TABLET | ORAL | 0 refills | Status: DC
Start: 2019-03-30 — End: 2019-05-15

## 2019-03-30 MED ORDER — METFORMIN SR 500 MG 24 HR TABLET
500 mg | ORAL_TABLET | ORAL | 0 refills | Status: DC
Start: 2019-03-30 — End: 2019-10-16

## 2019-03-30 MED ORDER — ROSUVASTATIN 10 MG TAB
10 mg | ORAL_TABLET | ORAL | 0 refills | Status: DC
Start: 2019-03-30 — End: 2019-09-10

## 2019-04-04 NOTE — Progress Notes (Signed)
Follow up phone call to patient, two pt identifiers verified. Discussed patient's goals:   Goals     ??? Completes all follow-up appointments      ? Educate and encourage importance of FU for prevention of complications or disease;  ? Assess the patient's relationship with a PCP and next FU visit scheduled;  o States she will call on Monday to make her FU appt.  ? Discuss importance of adherence to treatment plan and follow up visits;  ? Identify any barriers in transportation or access to FU appointments.   ? Assist patient with making FU appointments as needed;   ? It's also a good idea to know your test results.   ? Keep a list of the medicines you take.  7/20: Patient states she did get an appointment to see her PCP.  Has cardio appt on 8/6.  7/30: Follow up - Cardio 8/6.  Did not see her PCP because her chest pain ended up being muscular.  8/20: Follow completed.  Now seeing a chiropractor for back pain.  9/24: PCP in six weeks.    11/19: She has not made a f/up yet because of covid.  Has an elderly aunt who lives with her and does not want to expose any of her family members.  Encouraged her to continue to follow healthy diet.  12/17: States doing well with diet.  Keeping f/up visits.      ??? Demonstrates self-management strategies and behaviors for a healthy lifestylenter goal here      ? Verbalizes importance of a healthy lifestyle and impacts it has on health  o can lower risks for serious health problems such as high blood pressure, heart disease and diabetes;  o can prevent complications or exacerbations of chronic health conditions like Diabetes;  ? Has been on decreased carb diet, A1C down to 9.5 from 12.7 in about 6 months  ? Recognizes importance of completing follow up appointments with PCP and specialists;  ? Discussed healthy diet options   o generally: protein source (deck of cards = 3 ounces), five servings of fruits and vegetables (a piece of fruit or cup of vegetables) with every meal;   ? Sleep 7-8 hours per night;  ? Verbalizes importance of exercise as part of a daily routine   8/20: She hurt her back at work.  Did not file worker's comp claim because she didn't want to be told she would have to do covid testing to come back to work.  She is seeing a chiropractor regularly and is icing for 12 minutes every three to four.  9/24: Patient described meals she is eating - high protein, low carbs - Breakfast - eggs, cheese and shrimp or salmon, healthy snacks for lunch, dinner ground beef w/ cabbage and potatoes.  Drinking water excessively, average 130 ml/day.  Her provider is aware.  Plans to go to her PCP for A1C check in about 6 weeks.  10/8: States she had salmon and eggs this morning for breakfast. States diet has been going well.  Leaves to go to [place], [place] on 10/16 to visit family.  Encouraged her to plan meals.    11/19: Patient states she just had an eye appointment and eyes are healthy.  No effects of diabetes.  She is still maintaining a healthy diet.  States she is eating egg and shrimp for breakfast.  States her son is becoming engaged in better diet.  12/17: Diet remains on track.  Likes my calls  for accountability.  Asked me to keep calling until March.  Provided support and encouragement.                Patient's primary care provider relationship reviewed with patient and modified, as applicable.    Readiness to Change: []   Pre-contemplative    []   Contemplative  []   Preparation               [x]   Action                  []   Maintenance    Barriers/Challenges to Care: []   Decline in memory    []   Language barrier     []   Emotional                  []   Limited mobility  []   Lack of motivation     []  Vision, hearing or cognitive impairment []   Knowledge []  Financial Barriers []   Lack of support  []   Pain []   Other [x]   None    Key pt activities to achieve better health:   [x]   Weight loss  [x]   Improved diabetic control  []   Decreased cholesterol levels   []   Decreased blood pressure  []     []     Upcoming appointments:   Future Appointments   Date Time Provider [place]   05/23/2019  2:20 PM R[facility] MAMMO1 [facility] AMB   05/23/2019  2:40 PM Earna Coder, MD [facility] AMB     Plan for next call: Call in four weeks, 1/14.

## 2019-04-04 NOTE — Progress Notes (Signed)
 Follow up phone call to patient, two pt identifiers verified. Discussed patient's goals:   Goals     . Completes all follow-up appointments      . Educate and encourage importance of FU for prevention of complications or disease;  . Assess the patient's relationship with a PCP and next FU visit scheduled;  o States she will call on Monday to make her FU appt.  . Discuss importance of adherence to treatment plan and follow up visits;  . Identify any barriers in transportation or access to FU appointments.   . Assist patient with making FU appointments as needed;   . It's also a good idea to know your test results.   [name] Keep a list of the medicines you take.  7/20: Patient states she did get an appointment to see her PCP.  Has cardio appt on 8/6.  7/30: Follow up - Cardio 8/6.  Did not see her PCP because her chest pain ended up being muscular.  8/20: Follow completed.  Now seeing a chiropractor for back pain.  9/24: PCP in six weeks.    11/19: She has not made a f/up yet because of covid.  Has an elderly aunt who lives with her and does not want to expose any of her family members.  Encouraged her to continue to follow healthy diet.  12/17: States doing well with diet.  Keeping f/up visits.      . Demonstrates self-management strategies and behaviors for a healthy lifestylenter goal here      . Verbalizes importance of a healthy lifestyle and impacts it has on health  o can lower risks for serious health problems such as high blood pressure, heart disease and diabetes;  o can prevent complications or exacerbations of chronic health conditions like Diabetes;  - Has been on decreased carb diet, A1C down to 9.5 from 12.7 in about 6 months  . Recognizes importance of completing follow up appointments with PCP and specialists;  [name] Discussed healthy diet options   o generally: protein source (deck of cards = 3 ounces), five servings of fruits and vegetables (a piece of fruit or cup of vegetables) with every meal;  . Sleep  7-8 hours per night;  [name] [place] importance of exercise as part of a daily routine   8/20: She hurt her back at work.  Did not file worker's comp claim because she didn't want to be told she would have to do covid testing to come back to work.  She is seeing a chiropractor regularly and is icing for 12 minutes every three to four.  9/24: Patient described meals she is eating - high protein, low carbs - Breakfast - eggs, cheese and shrimp or salmon, healthy snacks for lunch, dinner ground beef w/ cabbage and potatoes.  Drinking water  excessively, average 130 ml/day.  Her provider is aware.  Plans to go to her PCP for A1C check in about 6 weeks.  10/8: States she had salmon and eggs this morning for breakfast. States diet has been going well.  Leaves to go to [place], [place] on 10/16 to visit family.  Encouraged her to plan meals.    11/19: Patient states she just had an eye appointment and eyes are healthy.  No effects of diabetes.  She is still maintaining a healthy diet.  States she is eating egg and shrimp for breakfast.  States her son is becoming engaged in better diet.  12/17: Diet remains on track.  Likes my calls  for accountability.  Asked me to keep calling until March.  Provided support and encouragement.                Patient's primary care provider relationship reviewed with patient and modified, as applicable.    Readiness to Change: []   Pre-contemplative    []   Contemplative  []   Preparation               [x]   Action                  []   Maintenance    Barriers/Challenges to Care: []   Decline in memory    []   Language barrier     []   Emotional                  []   Limited mobility  []   Lack of motivation     []  Vision, hearing or cognitive impairment []   Knowledge []  Financial Barriers []   Lack of support  []   Pain []   Other [x]   None    Key pt activities to achieve better health:   [x]   Weight loss  [x]   Improved diabetic control  []   Decreased cholesterol levels  []   Decreased blood pressure  []      []     Upcoming appointments:   Future Appointments   Date Time Provider Department Center   05/23/2019  2:20 PM [facility] M[facility]MO1 [facility] AM[facility]   05/23/2019  2:40 PM Walden Barrio, MD [facility] AMB     Plan for next call: Call in four weeks, 1/14.

## 2019-04-08 NOTE — Telephone Encounter (Signed)
Pt having problems urinating with lower back pain. Not sure if she has a UTI. No appts available until 04/10/19.  Please advise. [phone]

## 2019-04-08 NOTE — Telephone Encounter (Signed)
Urgent care or dispatch for eval

## 2019-04-09 DIAGNOSIS — B3741 Candidal cystitis and urethritis: Secondary | ICD-10-CM

## 2019-04-09 NOTE — Telephone Encounter (Signed)
After verifying patient's ID, advised patient per Dr Aleene Davidson to go to urgent care or dispatch health for eval. Patient verbalized understanding and states that she will go to [facility] urgent [facility] and return call to office to let us know the outcome.
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
## 2019-04-09 NOTE — ED Triage Notes (Signed)
Pt reports that she started with vaginal burning 10 days ago.  Pt reports that she used an over the counter vagisil and the area is now swollen and irritated.

## 2019-04-09 NOTE — ED Notes (Signed)
Pt reports that she started with vaginal burning 10 days ago.  Pt reports that she used an over the counter vagisil and the area is now swollen and irritated.
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
## 2019-04-10 ENCOUNTER — Inpatient Hospital Stay
Admit: 2019-04-10 | Discharge: 2019-04-10 | Disposition: A | Discharge status: Home / Self Care | Payer: PRIVATE HEALTH INSURANCE | Attending: Emergency Medicine

## 2019-04-10 LAB — URINALYSIS WITH MICROSCOPIC
Bilirubin, Urine: NEGATIVE
Glucose, Ur: >1000 mg/dL — AB
Ketones, Urine: NEGATIVE mg/dL
Leukocyte Esterase, Urine: NEGATIVE
Nitrite, Urine: NEGATIVE
Protein, UA: NEGATIVE mg/dL
Specific Gravity, UA: 1.015 (ref 1.003–1.030)
Urobilinogen, UA, POCT: 0.2 EU/dL (ref 0.2–1.0)
pH, UA: 5.5 (ref 5.0–8.0)

## 2019-04-10 LAB — URINALYSIS W/MICROSCOPIC
Bilirubin: NEGATIVE
Glucose: >1000 mg/dL — AB
Ketone: NEGATIVE mg/dL
Leukocyte Esterase: NEGATIVE
Nitrites: NEGATIVE
Protein: NEGATIVE mg/dL
Specific gravity: 1.015 (ref 1.003–1.030)
Urobilinogen: 0.2 EU/dL (ref 0.2–1.0)
pH (UA): 5.5 (ref 5.0–8.0)

## 2019-04-10 LAB — URINE CULTURE HOLD SAMPLE

## 2019-04-10 MED ORDER — CEPHALEXIN 500 MG CAP
500 mg | ORAL_CAPSULE | Freq: Three times a day (TID) | ORAL | 0 refills | Status: AC
Start: 2019-04-10 — End: 2019-04-16

## 2019-04-10 MED ORDER — IBUPROFEN 600 MG TAB
600 mg | ORAL_TABLET | Freq: Four times a day (QID) | ORAL | 0 refills | Status: DC | PRN
Start: 2019-04-10 — End: 2019-10-09

## 2019-04-10 MED ORDER — FLUCONAZOLE 150 MG TAB
150 mg | ORAL_TABLET | ORAL | 0 refills | Status: AC
Start: 2019-04-10 — End: 2019-04-09

## 2019-04-10 MED ORDER — VALACYCLOVIR 1 G TAB
1 gram | ORAL_TABLET | Freq: Every day | ORAL | 0 refills | Status: AC
Start: 2019-04-10 — End: 2019-04-23

## 2019-04-10 MED ORDER — CEPHALEXIN 250 MG CAP
250 mg | ORAL | Status: DC
Start: 2019-04-10 — End: 2019-04-10

## 2019-04-10 MED ORDER — IBUPROFEN 600 MG TAB
600 mg | ORAL | Status: AC
Start: 2019-04-10 — End: 2019-04-09
  Administered 2019-04-10: 03:00:00 via ORAL

## 2019-04-10 MED ORDER — FLUCONAZOLE 100 MG TAB
100 mg | Freq: Every day | ORAL | Status: DC
Start: 2019-04-10 — End: 2019-04-10

## 2019-04-10 MED FILL — IBUPROFEN 600 MG TAB: 600 mg | ORAL | Qty: 1

## 2019-04-10 NOTE — Progress Notes (Signed)
CCM follow up.    * Covering for ACM Syliva Overman, RN    Patient on with   [facility] ED visit for Vaginal yeast infection,  Acute cystitis without hematuria with D/C 04/09/19 .    Contacted patient for care management services. Introduced myself as covering for [employer] RN/Angie [employer]. Verified DOB and Zipcode for HIPAA security.    *Spoke with patient who reports that she is doing better today.  Prescriptions were filled and using medications as directed.  Patient has taken 2 does of her Antibiotic.  Drinking plenty of water.    *Patient states that today is the first time she has been able to use a wash cloth due to the irrigation and soreness at her vaginal area for at least the last 10 days. Had been using a bottle to spray the area with water.    *Patient thinks that because she was using Flavor packets in her water at least 10x a day the chemicals caused the UTI.  States she will not be using the Flavor packets as much during the day.    *Offered to make follow up appointments for her and patient declined. States that she will make PCP and OB/GYN follow ups as soon as we finish with phone call.      Chart review: 04/09/19  Durene Cal, DO - Emergency  This is a 38 year old female comes emergency room with chief complaint of vaginal burning.  States that this has been going on for the past 10 days.  Patient states that she has been using over-the-counter Vagisil in the pain and swelling has increased.  Patient denies any fever or chills.  Patient denies any diarrhea constipation.  Patient states that she is also diabetic and has been unable to check her glucose levels secondary to insurance and lack of availability.  Patient also states her machine broke.      ??       Current Discharge Medication List   ??        START taking these medications   ?? Details   cephALEXin (Keflex) 500 mg capsule Take 1 Cap by mouth three (3) times daily for 7 days.  Qty: 21 Cap, Refills: 0   ??    fluconazole (Diflucan) 150 mg tablet Take 1 Tab by mouth now for 1 dose. FDA advises cautious prescribing of oral fluconazole in pregnancy. Take in one week.  Qty: 1 Tab, Refills: 0   ??   ibuprofen (MOTRIN) 600 mg tablet Take 1 Tab by mouth every six (6) hours as needed for Pain.  Qty: 20 Tab, Refills: 0   ??   valACYclovir (VALTREX) 1 gram tablet Take 1 Tab by mouth daily for 14 days.  Qty: 14 Tab, Refills: 0   ??       Patient had no other questions or concerns. Contact information provided and reminded her that I can be reached if any needs arise or unable to reach ACM.      Next outreach: Per ACM Syliva Overman

## 2019-04-10 NOTE — Progress Notes (Signed)
A user error has taken place: encounter opened in error, closed for administrative reasons.

## 2019-04-10 NOTE — Progress Notes (Signed)
A user error has taken place: encounter opened in error, closed for administrative reasons.

## 2019-04-10 NOTE — Progress Notes (Signed)
 CCM follow up.    * Covering for ACM [place] Ferrier, RN    Patient on with  [facility] ED visit for Vaginal yeast infection,  Acute cystitis without hematuria with D/C 04/09/19 .    Contacted patient for care management services. Introduced myself as covering for [employer] RN/Angie [employer]. Verified DOB and Zipcode for HIPAA security.    *Spoke with patient who reports that she is doing better today.  Prescriptions were filled and using medications as directed.  Patient has taken 2 does of her Antibiotic.  Drinking plenty of water .    *Patient states that today is the first time she has been able to use a wash cloth due to the irrigation and soreness at her vaginal area for at least the last 10 days. Had been using a bottle to spray the area with water .    *Patient thinks that because she was using Flavor packets in her water  at least 10x a day the chemicals caused the UTI.  States she will not be using the Flavor packets as much during the day.    *Offered to make follow up appointments for her and patient declined. States that she will make PCP and OB/GYN follow ups as soon as we finish with phone call.      Chart review: 04/09/19  Vinita Lonni HERO, DO - Emergency  This is a 38 year old female comes emergency room with chief complaint of vaginal burning.  States that this has been going on for the past 10 days.  Patient states that she has been using over-the-counter Vagisil in the pain and swelling has increased.  Patient denies any fever or chills.  Patient denies any diarrhea constipation.  Patient states that she is also diabetic and has been unable to check her glucose levels secondary to insurance and lack of availability.  Patient also states her machine broke.             Current Discharge Medication List           START taking these medications    Details   cephALEXin (Keflex) 500 mg capsule Take 1 Cap by mouth three (3) times daily for 7 days.  Qty: 21 Cap, Refills: 0      fluconazole (Diflucan) 150 mg tablet  Take 1 Tab by mouth now for 1 dose. FDA advises cautious prescribing of oral fluconazole in pregnancy. Take in one week.  Qty: 1 Tab, Refills: 0      ibuprofen  (MOTRIN ) 600 mg tablet Take 1 Tab by mouth every six (6) hours as needed for Pain.  Qty: 20 Tab, Refills: 0      valACYclovir  (VALTREX ) 1 gram tablet Take 1 Tab by mouth daily for 14 days.  Qty: 14 Tab, Refills: 0          Patient had no other questions or concerns. Contact information provided and reminded her that I can be reached if any needs arise or unable to reach ACM.      Next outreach: Per ACM Bruno Ferrier

## 2019-04-11 LAB — CULTURE, URINE
Culture result:: NO GROWTH
Culture: NO GROWTH

## 2019-05-01 ENCOUNTER — Telehealth: Attending: Family Medicine | Primary: Family Medicine

## 2019-05-01 ENCOUNTER — Telehealth
Admit: 2019-05-01 | Discharge: 2019-05-01 | Payer: PRIVATE HEALTH INSURANCE | Attending: Family Medicine | Primary: Family Medicine

## 2019-05-01 DIAGNOSIS — I10 Essential (primary) hypertension: Secondary | ICD-10-CM

## 2019-05-01 MED ORDER — FREESTYLE LIBRE 14 DAY READER
11 refills | Status: DC
Start: 2019-05-01 — End: 2020-04-21

## 2019-05-01 MED ORDER — FREESTYLE LIBRE 14 DAY SENSOR KIT
PACK | 11 refills | Status: DC
Start: 2019-05-01 — End: 2020-04-21

## 2019-05-01 NOTE — Progress Notes (Signed)
Chief Complaint   Patient presents with   ??? Hospital Follow Up     1. Have you been to the ER, urgent care clinic since your last visit?  Hospitalized since your last visit? Yes 04/23/2019 [facility], UTI.    2. Have you seen or consulted any other health care providers outside of the [facility] since your last visit?  Include any pap smears or colon screening. No

## 2019-05-01 NOTE — Progress Notes (Signed)
Alyssa [name] is a 39 y.o. female who was seen by synchronous (real-time) audio-video technology on 05/01/2019.      Consent: Aracelia Brinson, who was seen by synchronous (real-time) audio-video technology, and/or her healthcare decision maker, is aware that this patient-initiated, Telehealth encounter on 05/01/2019 is a billable service, with coverage as determined by her insurance carrier. She is aware that she may receive a bill and has provided verbal consent to proceed: Yes.    Assessment & Plan:   1. Type 2 diabetes mellitus with complication, with long-term current use of insulin ([place])  Labs per orders  +glucosuria  Hx hypoglycemia--was prev using libre, will send to pharmacy  Recommend we need to change med management after the patient has labs done,s he'll call for a follow up appoitnment  - HEMOGLOBIN A1C WITH EAG; Future  - CBC W/O DIFF; Future  - METABOLIC PANEL, COMPREHENSIVE; Future  - MICROALBUMIN, UR, RAND W/ MICROALB/CREAT RATIO; Future  - LIPID PANEL; Future  - flash glucose scanning reader (FreeStyle Libre 14 Day Reader) misc; Check bg regularly, use per package instructions dx: 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  Dispense: 1 Each; Refill: 11  - flash glucose sensor (FreeStyle Libre 14 Day Sensor) kit; Check bg regularly use per package instructions: dx 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  Dispense: 1 Kit; Refill: 11    2. Obesity, morbid ([place])  Encourage patient with healthy habits, reinforced some of the dm education she prev had with our team  - HEMOGLOBIN A1C WITH EAG; Future  - CBC W/O DIFF; Future  - METABOLIC PANEL, COMPREHENSIVE; Future  - MICROALBUMIN, UR, RAND W/ MICROALB/CREAT RATIO; Future  - LIPID PANEL; Future    3. Essential hypertension  bp medication might be causing dizziness? There was some question about this or maybe fatigue, will monitor, this is important for renal protection  - HEMOGLOBIN A1C WITH EAG; Future  - CBC W/O DIFF; Future   - METABOLIC PANEL, COMPREHENSIVE; Future  - MICROALBUMIN, UR, RAND W/ MICROALB/CREAT RATIO; Future  - LIPID PANEL; Future    4. Hypertriglyceridemia  On crestor, need recheck of labs  - HEMOGLOBIN A1C WITH EAG; Future  - CBC W/O DIFF; Future  - METABOLIC PANEL, COMPREHENSIVE; Future  - MICROALBUMIN, UR, RAND W/ MICROALB/CREAT RATIO; Future  - LIPID PANEL; Future    5. Hypoglycemia  As above  - HEMOGLOBIN A1C WITH EAG; Future  - CBC W/O DIFF; Future  - METABOLIC PANEL, COMPREHENSIVE; Future  - MICROALBUMIN, UR, RAND W/ MICROALB/CREAT RATIO; Future  - LIPID PANEL; Future  - flash glucose scanning reader (FreeStyle Libre 14 Day Reader) misc; Check bg regularly, use per package instructions dx: 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  Dispense: 1 Each; Refill: 11  - flash glucose sensor (FreeStyle Libre 14 Day Sensor) kit; Check bg regularly use per package instructions: dx 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  Dispense: 1 Kit; Refill: 11    6. Uncontrolled type 2 diabetes mellitus with complication (HCC)  As above  - flash glucose scanning reader (FreeStyle Libre 14 Day Reader) misc; Check bg regularly, use per package instructions dx: 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  Dispense: 1 Each; Refill: 11  - flash glucose sensor (FreeStyle Libre 14 Day Sensor) kit; Check bg regularly use per package instructions: dx 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  Dispense: 1 Kit; Refill: 11    7. Thyroid nodule  Due for recheck, was seen on outside  imaging she reports, asymptomatic  - TSH 3RD GENERATION; Future          Pt was counseled on risks, benefits and alternatives of treatment options. All questions were asked and answered and the patient was agreeable with the treatment plan as outlined.    Subjective:   Alyssa [name] is a 39 y.o. female who was seen for [place] to [facility] 04/09/19 for Yeast infection  Her urine culture was <1000 cfu   ua showed significant glucosuria  She hasn't been testing her sugars  She reports her chard snyder card doesn't have funds on it  She's been out of the [employer] not using the finger stick blood glucose monitor because she lost it  Medications, allergies, PMH, PSH, SOCH, [facility] reviewed and updated per routine protocol, see chart for review and changes if not noted here.    Insulin--using 80 of lantus at night time--unknown fasting sugars    Lisinopril sometimes makes her a little dizzy at times    Taking metformin with compliance    Did have diabetic ed, did not follow through on sticking with the plans in place as were directed recently    ROS  A 12 point review of systems was negative except as noted here or in the HPI.    Objective:   Vital Signs: (As obtained by patient/caregiver at home)  Patient-Reported Vitals 05/01/2019   Patient-Reported Weight 274lb   Patient-Reported Height -   Patient-Reported Temperature -   Patient-Reported Systolic  -        [INSTRUCTIONS:  "[x] " Indicates a positive item  "[] " Indicates a negative item  -- DELETE ALL ITEMS NOT EXAMINED]    Constitutional: [x]  Appears well-developed and well-nourished [x]  No apparent distress      []  Abnormal -     Mental status: [x]  Alert and awake  [x]  Oriented to person/place/time [x]  Able to follow commands    []  Abnormal -     Eyes:   EOM    [x]   Normal    []  Abnormal -   Sclera  [x]   Normal    []  Abnormal -          Discharge [x]   None visible   []  Abnormal -     HENT: [x]  Normocephalic, atraumatic  []  Abnormal -   [x]  Mouth/Throat: Mucous membranes are moist    External Ears [x]  Normal  []  Abnormal -    Neck: [x]  No visualized mass []  Abnormal -     Pulmonary/Chest: [x]  Respiratory effort normal   [x]  No visualized signs of difficulty breathing or respiratory distress        []  Abnormal -      Musculoskeletal:   [x]  Normal gait with no signs of ataxia         [x]  Normal range of motion of neck        []  Abnormal -      Neurological:        [x]  No Facial Asymmetry (Cranial nerve 7 motor function) (limited exam due to video visit)          [x]  No gaze palsy        []  Abnormal -          Skin:        [x]  No significant exanthematous lesions or discoloration noted on facial skin         []  Abnormal -  Psychiatric:       [x]  Normal Affect []  Abnormal -        [x]  No Hallucinations    Other pertinent observable physical exam findings:obese, ambulates through home    We discussed the expected course, resolution and complications of the diagnosis(es) in detail.  Medication risks, benefits, costs, interactions, and alternatives were discussed as indicated.  I advised her to contact the office if her condition worsens, changes or fails to improve as anticipated. She expressed understanding with the diagnosis(es) and plan.       Alyssa [name] is a 39 y.o. female who was evaluated by a video visit encounter for concerns as above. Patient identification was verified prior to start of the visit. A caregiver was present when appropriate. Due to this being a Scientist, physiological (During URKYH-06 public health emergency), evaluation of the following organ systems was limited: Vitals/Constitutional/EENT/Resp/CV/GI/GU/MS/Neuro/Skin/Heme-Lymph-Imm.  Pursuant to the emergency declaration under the [place], 1135 waiver authority and the [employer] Act, this Virtual  Visit was conducted, with patient's (and/or legal guardian's) consent, to reduce the patient's risk of exposure to COVID-19 and provide necessary medical care.     Services were provided through a video synchronous discussion virtually to substitute for in-person clinic visit.   Patient and provider were located at their individual homes.      Doneen Poisson, MD  [facility]  05/01/19 9:31 AM      Portions of this note may have been populated using smart dictation software and may have "sounds-like" errors present.

## 2019-05-01 NOTE — Progress Notes (Signed)
Alyssa [name] is a 39 y.o. female who was seen by synchronous (real-time) audio-video technology on 05/01/2019.      Consent: Alyssa [name], who was seen by synchronous (real-time) audio-video technology, and/or her healthcare decision maker, is aware that this patient-initiated, Telehealth encounter on 05/01/2019 is a billable service, with coverage as determined by her insurance carrier. She is aware that she may receive a bill and has provided verbal consent to proceed: Yes.    Assessment & Plan:   1. Type 2 diabetes mellitus with complication, with long-term current use of insulin ([place])  Labs per orders  +glucosuria  Hx hypoglycemia--was prev using libre, will send to pharmacy  Recommend we need to change med management after the patient has labs done,s he'll call for a follow up appoitnment  - HEMOGLOBIN A1C WITH EAG; Future  - CBC W/O DIFF; Future  - METABOLIC PANEL, COMPREHENSIVE; Future  - MICROALBUMIN, UR, RAND W/ MICROALB/CREAT RATIO; Future  - LIPID PANEL; Future  - flash glucose scanning reader (FreeStyle Libre 14 Day Reader) misc; Check bg regularly, use per package instructions dx: 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  Dispense: 1 Each; Refill: 11  - flash glucose sensor (FreeStyle Libre 14 Day Sensor) kit; Check bg regularly use per package instructions: dx 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  Dispense: 1 Kit; Refill: 11    2. Obesity, morbid ([place])  Encourage patient with healthy habits, reinforced some of the dm education she prev had with our team  - HEMOGLOBIN A1C WITH EAG; Future  - CBC W/O DIFF; Future  - METABOLIC PANEL, COMPREHENSIVE; Future  - MICROALBUMIN, UR, RAND W/ MICROALB/CREAT RATIO; Future  - LIPID PANEL; Future    3. Essential hypertension  bp medication might be causing dizziness? There was some question about this or maybe fatigue, will monitor, this is important for renal protection  - HEMOGLOBIN A1C WITH EAG; Future  - CBC W/O DIFF; Future  -  METABOLIC PANEL, COMPREHENSIVE; Future  - MICROALBUMIN, UR, RAND W/ MICROALB/CREAT RATIO; Future  - LIPID PANEL; Future    4. Hypertriglyceridemia  On crestor, need recheck of labs  - HEMOGLOBIN A1C WITH EAG; Future  - CBC W/O DIFF; Future  - METABOLIC PANEL, COMPREHENSIVE; Future  - MICROALBUMIN, UR, RAND W/ MICROALB/CREAT RATIO; Future  - LIPID PANEL; Future    5. Hypoglycemia  As above  - HEMOGLOBIN A1C WITH EAG; Future  - CBC W/O DIFF; Future  - METABOLIC PANEL, COMPREHENSIVE; Future  - MICROALBUMIN, UR, RAND W/ MICROALB/CREAT RATIO; Future  - LIPID PANEL; Future  - flash glucose scanning reader (FreeStyle Libre 14 Day Reader) misc; Check bg regularly, use per package instructions dx: 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  Dispense: 1 Each; Refill: 11  - flash glucose sensor (FreeStyle Libre 14 Day Sensor) kit; Check bg regularly use per package instructions: dx 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  Dispense: 1 Kit; Refill: 11    6. Uncontrolled type 2 diabetes mellitus with complication (HCC)  As above  - flash glucose scanning reader (FreeStyle Libre 14 Day Reader) misc; Check bg regularly, use per package instructions dx: 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  Dispense: 1 Each; Refill: 11  - flash glucose sensor (FreeStyle Libre 14 Day Sensor) kit; Check bg regularly use per package instructions: dx 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  Dispense: 1 Kit; Refill: 11    7. Thyroid nodule  Due for recheck, was seen on outside  imaging she reports, asymptomatic  - TSH 3RD GENERATION; Future          Pt was counseled on risks, benefits and alternatives of treatment options. All questions were asked and answered and the patient was agreeable with the treatment plan as outlined.    Subjective:   Alyssa [name] is a 39 y.o. female who was seen for [place] to [facility] 04/09/19 for Yeast infection  Her urine culture was <1000 cfu  ua  showed significant glucosuria  She hasn't been testing her sugars  She reports her chard snyder card doesn't have funds on it  She's been out of the [employer] not using the finger stick blood glucose monitor because she lost it  Medications, allergies, PMH, PSH, SOCH, [facility] reviewed and updated per routine protocol, see chart for review and changes if not noted here.    Insulin--using 80 of lantus at night time--unknown fasting sugars    Lisinopril sometimes makes her a little dizzy at times    Taking metformin with compliance    Did have diabetic ed, did not follow through on sticking with the plans in place as were directed recently    ROS  A 12 point review of systems was negative except as noted here or in the HPI.    Objective:   Vital Signs: (As obtained by patient/caregiver at home)  Patient-Reported Vitals 05/01/2019   Patient-Reported Weight 274lb   Patient-Reported Height -   Patient-Reported Temperature -   Patient-Reported Systolic  -        [INSTRUCTIONS:  "_0 " Indicates a positive item  "_1 " Indicates a negative item  -- DELETE ALL ITEMS NOT EXAMINED]    Constitutional: _2  Appears well-developed and well-nourished _3  No apparent distress      _4  Abnormal -     Mental status: _5  Alert and awake  _6  Oriented to person/place/time _7  Able to follow commands    _8  Abnormal -     Eyes:   EOM    _9   Normal    _10  Abnormal -   Sclera  _11   Normal    _12  Abnormal -          Discharge _13   None visible   _14  Abnormal -     HENT: <UNGBMBOMQTTCNGFR>_4<\/VQWQVLDKCCQFJUVQ>_22  Normocephalic, atraumatic  <IVHOYWVXUCJARWPT>_0<\/YPEJYLTEIHDTPNSQ>_58  Abnormal -   _17  Mouth/Throat: Mucous membranes are moist    External Ears _18  Normal  _19  Abnormal -    Neck: _20  No visualized mass _21  Abnormal -     Pulmonary/Chest: _22  Respiratory effort normal   _23  No visualized signs of difficulty breathing or respiratory distress        _24  Abnormal -      Musculoskeletal:   _25  Normal gait with no signs of ataxia         _26  Normal range of motion of neck        _27  Abnormal -     Neurological:        _28  No  Facial Asymmetry (Cranial nerve 7 motor function) (limited exam due to video visit)          _29  No gaze palsy        _30  Abnormal -          Skin:        _31  No significant exanthematous lesions or discoloration noted on facial skin         _32  Abnormal -  Psychiatric:       _0  Normal Affect _1  Abnormal -        _2  No Hallucinations    Other pertinent observable physical exam findings:obese, ambulates through home    We discussed the expected course, resolution and complications of the diagnosis(es) in detail.  Medication risks, benefits, costs, interactions, and alternatives were discussed as indicated.  I advised her to contact the office if her condition worsens, changes or fails to improve as anticipated. She expressed understanding with the diagnosis(es) and plan.       Alyssa [name] is a 39 y.o. female who was evaluated by a video visit encounter for concerns as above. Patient identification was verified prior to start of the visit. A caregiver was present when appropriate. Due to this being a Scientist, physiological (During EGBTD-17 public health emergency), evaluation of the following organ systems was limited: Vitals/Constitutional/EENT/Resp/CV/GI/GU/MS/Neuro/Skin/Heme-Lymph-Imm.  Pursuant to the emergency declaration under the [place], 1135 waiver authority and the [employer] Act, this Virtual  Visit was conducted, with patient's (and/or legal guardian's) consent, to reduce the patient's risk of exposure to COVID-19 and provide necessary medical care.     Services were provided through a video synchronous discussion virtually to substitute for in-person clinic visit.   Patient and provider were located at their individual homes.      Doneen Poisson, MD  [facility]  05/01/19 9:31 AM     Portions of this note may have been populated using smart dictation software and may have  "sounds-like" errors present.

## 2019-05-01 NOTE — Progress Notes (Signed)
 Chief Complaint   Patient presents with   . Hospital Follow Up     1. Have you been to the ER, urgent care clinic since your last visit?  Hospitalized since your last visit? Yes 04/23/2019 [facility], UTI.    2. Have you seen or consulted any other health care providers outside of the [facility] since your last visit?  Include any pap smears or colon screening. No

## 2019-05-02 MED FILL — FREESTYLE LIBRE 14 DAY SENSO  MISC: 28 days supply | Qty: 2 | Fill #0 | Status: AC

## 2019-05-02 NOTE — Progress Notes (Signed)
Follow up phone call to patient, two pt identifiers verified. Discussed patient's goals:   Goals     ??? Completes all follow-up appointments      ? Educate and encourage importance of FU for prevention of complications or disease;  ? Assess the patient's relationship with a PCP and next FU visit scheduled;  o States she will call on Monday to make her FU appt.  ? Discuss importance of adherence to treatment plan and follow up visits;  ? Identify any barriers in transportation or access to FU appointments.   ? Assist patient with making FU appointments as needed;   ? It's also a good idea to know your test results.   ? Keep a list of the medicines you take.  7/20: Patient states she did get an appointment to see her PCP.  Has cardio appt on 8/6.  7/30: Follow up - Cardio 8/6.  Did not see her PCP because her chest pain ended up being muscular.  8/20: Follow completed.  Now seeing a chiropractor for back pain.  9/24: PCP in six weeks.    11/19: She has not made a f/up yet because of covid.  Has an elderly aunt who lives with her and does not want to expose any of her family members.  Encouraged her to continue to follow healthy diet.  12/17: States doing well with diet.  Keeping f/up visits.  1/14: had follow up with her PCP yesterday, going to get labs done on 1/15.  Will make a follow up appointment on 1/19.        ??? Demonstrates self-management strategies and behaviors for a healthy lifestylenter goal here      ? Verbalizes importance of a healthy lifestyle and impacts it has on health  o can lower risks for serious health problems such as high blood pressure, heart disease and diabetes;  o can prevent complications or exacerbations of chronic health conditions like Diabetes;  ? Has been on decreased carb diet, A1C down to 9.5 from 12.7 in about 6 months  ? Recognizes importance of completing follow up appointments with PCP and specialists;  ? Discussed healthy diet options    o generally: protein source (deck of cards = 3 ounces), five servings of fruits and vegetables (a piece of fruit or cup of vegetables) with every meal;  ? Sleep 7-8 hours per night;  ? Verbalizes importance of exercise as part of a daily routine   8/20: She hurt her back at work.  Did not file worker's comp claim because she didn't want to be told she would have to do covid testing to come back to work.  She is seeing a chiropractor regularly and is icing for 12 minutes every three to four.  9/24: Patient described meals she is eating - high protein, low carbs - Breakfast - eggs, cheese and shrimp or salmon, healthy snacks for lunch, dinner ground beef w/ cabbage and potatoes.  Drinking water excessively, average 130 ml/day.  Her provider is aware.  Plans to go to her PCP for A1C check in about 6 weeks.  10/8: States she had salmon and eggs this morning for breakfast. States diet has been going well.  Leaves to go to [place], [place] on 10/16 to visit family.  Encouraged her to plan meals.    11/19: Patient states she just had an eye appointment and eyes are healthy.  No effects of diabetes.  She is still maintaining a healthy diet.  States she  is eating egg and shrimp for breakfast.  States her son is becoming engaged in better diet.  12/17: Diet remains on track.  Likes my calls for accountability.  Asked me to keep calling until March.  Provided support and encouragement.    1/14: States she had a spell of eating bad. Had shrimp and eggs for breakfast, dinner melanesia - ([place]), provided support and encouragement.              Patient's primary care provider relationship reviewed with patient and modified, as applicable.      Upcoming appointments:   Future Appointments   Date Time Provider [place]   05/23/2019  2:20 PM RIC ROBGYN MAMMO1 [facility] BS AMB   05/23/2019  2:40 PM Earna Coder, MD [facility] BS AMB     Plan for next call: Will call back in three weeks, 2/4.

## 2019-05-02 NOTE — Progress Notes (Signed)
Attempt to reach patient for follow up. Discreet VM left with contact information. Will call back in three weeks, 2/4.

## 2019-05-02 NOTE — Progress Notes (Signed)
 Follow up phone call to patient, two pt identifiers verified. Discussed patient's goals:   Goals     . Completes all follow-up appointments      . Educate and encourage importance of FU for prevention of complications or disease;  . Assess the patient's relationship with a PCP and next FU visit scheduled;  o States she will call on Monday to make her FU appt.  . Discuss importance of adherence to treatment plan and follow up visits;  . Identify any barriers in transportation or access to FU appointments.   . Assist patient with making FU appointments as needed;   . It's also a good idea to know your test results.   [name] Keep a list of the medicines you take.  7/20: Patient states she did get an appointment to see her PCP.  Has cardio appt on 8/6.  7/30: Follow up - Cardio 8/6.  Did not see her PCP because her chest pain ended up being muscular.  8/20: Follow completed.  Now seeing a chiropractor for back pain.  9/24: PCP in six weeks.    11/19: She has not made a f/up yet because of covid.  Has an elderly aunt who lives with her and does not want to expose any of her family members.  Encouraged her to continue to follow healthy diet.  12/17: States doing well with diet.  Keeping f/up visits.  1/14: had follow up with her PCP yesterday, going to get labs done on 1/15.  Will make a follow up appointment on 1/19.        . Demonstrates self-management strategies and behaviors for a healthy lifestylenter goal here      . Verbalizes importance of a healthy lifestyle and impacts it has on health  o can lower risks for serious health problems such as high blood pressure, heart disease and diabetes;  o can prevent complications or exacerbations of chronic health conditions like Diabetes;  - Has been on decreased carb diet, A1C down to 9.5 from 12.7 in about 6 months  . Recognizes importance of completing follow up appointments with PCP and specialists;  [name] Discussed healthy diet options   o generally: protein source (deck of  cards = 3 ounces), five servings of fruits and vegetables (a piece of fruit or cup of vegetables) with every meal;  . Sleep 7-8 hours per night;  [name] [place] importance of exercise as part of a daily routine   8/20: She hurt her back at work.  Did not file worker's comp claim because she didn't want to be told she would have to do covid testing to come back to work.  She is seeing a chiropractor regularly and is icing for 12 minutes every three to four.  9/24: Patient described meals she is eating - high protein, low carbs - Breakfast - eggs, cheese and shrimp or salmon, healthy snacks for lunch, dinner ground beef w/ cabbage and potatoes.  Drinking water  excessively, average 130 ml/day.  Her provider is aware.  Plans to go to her PCP for A1C check in about 6 weeks.  10/8: States she had salmon and eggs this morning for breakfast. States diet has been going well.  Leaves to go to [place], [place] on 10/16 to visit family.  Encouraged her to plan meals.    11/19: Patient states she just had an eye appointment and eyes are healthy.  No effects of diabetes.  She is still maintaining a healthy diet.  States she  is eating egg and shrimp for breakfast.  States her son is becoming engaged in better diet.  12/17: Diet remains on track.  Likes my calls for accountability.  Asked me to keep calling until March.  Provided support and encouragement.    1/14: States she had a spell of eating bad. Had shrimp and eggs for breakfast, dinner [place] provided support and encouragement.              Patient's primary care provider relationship reviewed with patient and modified, as applicable.      Upcoming appointments:   Future Appointments   Date Time Provider Department Center   05/23/2019  2:20 PM [facility]N [facility] AMB   05/23/2019  2:40 PM Walden Barrio, MD [facility] AMB     Plan for next call: Will call back in three weeks, 2/4.

## 2019-05-03 NOTE — Telephone Encounter (Signed)
Your information has been sent to [place].

## 2019-05-06 NOTE — Telephone Encounter (Signed)
Pt's Freestyle Alyssa [name] has been denied.    Per [employer] pt must have tried and failed   Dexcom G6( which will also require a PA)

## 2019-05-13 ENCOUNTER — Encounter

## 2019-05-13 NOTE — Progress Notes (Signed)
Urine microalbumin normal  Thyroid normal  Cholesterol imiproving, Triglycerides not quite to goal yet but big improvement from last year  Electrolytes, kidney and liver functions normal  Sugar is worse than last check with a1c at 10.2    Blood counts normal except mild increase in PLT count which might be reactive, we'll watch this with our next set of labs    Talk to you tomorrow about all of this

## 2019-05-14 LAB — TSH 3RD GENERATION
TSH: 1.95 u[IU]/mL (ref 0.36–3.74)
TSH: 1.95 u[IU]/mL (ref 0.36–3.74)

## 2019-05-14 LAB — COMPREHENSIVE METABOLIC PANEL
ALT: 20 U/L (ref 12–78)
AST: 12 U/L — ABNORMAL LOW (ref 15–37)
Albumin/Globulin Ratio: 1 — ABNORMAL LOW (ref 1.1–2.2)
Albumin: 4 g/dL (ref 3.5–5.0)
Alkaline Phosphatase: 69 U/L (ref 45–117)
Anion Gap: 9 mmol/L (ref 5–15)
BUN/Creatinine Ratio: 17 (ref 12–20)
BUN: 13 MG/DL (ref 6–20)
CO2: 23 mmol/L (ref 21–32)
Calcium: 10 MG/DL (ref 8.5–10.1)
Chloride: 104 mmol/L (ref 97–108)
Creatinine: 0.77 MG/DL (ref 0.55–1.02)
GFR African American: >60 mL/min/{1.73_m2} (ref >60)
Globulin: 4.1 g/dL — ABNORMAL HIGH (ref 2.0–4.0)
Glucose: 198 mg/dL — ABNORMAL HIGH (ref 65–100)
Potassium: 4.2 mmol/L (ref 3.5–5.1)
Sodium: 136 mmol/L (ref 136–145)
Total Bilirubin: 0.5 MG/DL (ref 0.2–1.0)
Total Protein: 8.1 g/dL (ref 6.4–8.2)
eGFR NON-AA: >60 mL/min/{1.73_m2} (ref >60)

## 2019-05-14 LAB — LIPID PANEL
CHOL/HDL Ratio: 3 (ref 0.0–5.0)
Chol/HDL Ratio: 3 (ref 0.0–5.0)
Cholesterol, Total: 142 MG/DL (ref <200)
Cholesterol, total: 142 MG/DL (ref <200)
HDL Cholesterol: 48 MG/DL
HDL: 48 MG/DL
LDL Calculated: 54 MG/DL (ref 0–100)
LDL, calculated: 54 MG/DL (ref 0–100)
Triglyceride: 200 MG/DL — ABNORMAL HIGH (ref <150)
Triglycerides: 200 MG/DL — ABNORMAL HIGH (ref <150)
VLDL Cholesterol Calculated: 40 MG/DL
VLDL, calculated: 40 MG/DL

## 2019-05-14 LAB — CBC
Hematocrit: 41.3 % (ref 35.0–47.0)
Hemoglobin: 12.6 g/dL (ref 11.5–16.0)
MCH: 25.5 PG — ABNORMAL LOW (ref 26.0–34.0)
MCHC: 30.5 g/dL (ref 30.0–36.5)
MCV: 83.6 FL (ref 80.0–99.0)
MPV: 11.6 FL (ref 8.9–12.9)
NRBC Absolute: 0 10*3/uL (ref 0.00–0.01)
Nucleated RBCs: 0 PER 100 WBC
Platelets: 490 10*3/uL — ABNORMAL HIGH (ref 150–400)
RBC: 4.94 M/uL (ref 3.80–5.20)
RDW: 15.7 % — ABNORMAL HIGH (ref 11.5–14.5)
WBC: 10.6 10*3/uL (ref 3.6–11.0)

## 2019-05-14 LAB — HEMOGLOBIN A1C W/EAG
Estimated Avg Glucose: 246 mg/dL
Hemoglobin A1C: 10.2 % — ABNORMAL HIGH (ref 4.0–5.6)

## 2019-05-14 LAB — MICROALBUMIN / CREATININE URINE RATIO
Creatinine, Ur: 94.8 mg/dL
Microalb, Ur: 1.8 MG/DL
Microalb/Creat Ratio: 19 mg/g (ref 0–30)

## 2019-05-14 LAB — CBC W/O DIFF
ABSOLUTE NRBC: 0 10*3/uL (ref 0.00–0.01)
HCT: 41.3 % (ref 35.0–47.0)
HGB: 12.6 g/dL (ref 11.5–16.0)
MCH: 25.5 PG — ABNORMAL LOW (ref 26.0–34.0)
MCHC: 30.5 g/dL (ref 30.0–36.5)
MCV: 83.6 FL (ref 80.0–99.0)
MPV: 11.6 FL (ref 8.9–12.9)
NRBC: 0 PER 100 WBC
PLATELET: 490 10*3/uL — ABNORMAL HIGH (ref 150–400)
RBC: 4.94 M/uL (ref 3.80–5.20)
RDW: 15.7 % — ABNORMAL HIGH (ref 11.5–14.5)
WBC: 10.6 10*3/uL (ref 3.6–11.0)

## 2019-05-14 LAB — METABOLIC PANEL, COMPREHENSIVE
A-G Ratio: 1 — ABNORMAL LOW (ref 1.1–2.2)
ALT (SGPT): 20 U/L (ref 12–78)
AST (SGOT): 12 U/L — ABNORMAL LOW (ref 15–37)
Albumin: 4 g/dL (ref 3.5–5.0)
Alk. phosphatase: 69 U/L (ref 45–117)
Anion gap: 9 mmol/L (ref 5–15)
BUN/Creatinine ratio: 17 (ref 12–20)
BUN: 13 MG/DL (ref 6–20)
Bilirubin, total: 0.5 MG/DL (ref 0.2–1.0)
CO2: 23 mmol/L (ref 21–32)
Calcium: 10 MG/DL (ref 8.5–10.1)
Chloride: 104 mmol/L (ref 97–108)
Creatinine: 0.77 MG/DL (ref 0.55–1.02)
GFR est AA: >60 mL/min/{1.73_m2} (ref >60)
GFR est non-AA: >60 mL/min/{1.73_m2} (ref >60)
Globulin: 4.1 g/dL — ABNORMAL HIGH (ref 2.0–4.0)
Glucose: 198 mg/dL — ABNORMAL HIGH (ref 65–100)
Potassium: 4.2 mmol/L (ref 3.5–5.1)
Protein, total: 8.1 g/dL (ref 6.4–8.2)
Sodium: 136 mmol/L (ref 136–145)

## 2019-05-14 LAB — MICROALBUMIN, UR, RAND W/ MICROALB/CREAT RATIO
Creatinine, urine random: 94.8 mg/dL
Microalbumin,urine random: 1.8 MG/DL
Microalbumin/Creat ratio (mg/g creat): 19 mg/g (ref 0–30)

## 2019-05-14 LAB — SAMPLES BEING HELD

## 2019-05-14 LAB — HEMOGLOBIN A1C WITH EAG
Est. average glucose: 246 mg/dL
Hemoglobin A1c: 10.2 % — ABNORMAL HIGH (ref 4.0–5.6)

## 2019-05-15 ENCOUNTER — Telehealth: Attending: Family Medicine | Primary: Family Medicine

## 2019-05-15 ENCOUNTER — Telehealth
Admit: 2019-05-15 | Discharge: 2019-05-15 | Payer: PRIVATE HEALTH INSURANCE | Attending: Family Medicine | Primary: Family Medicine

## 2019-05-15 DIAGNOSIS — E118 Type 2 diabetes mellitus with unspecified complications: Secondary | ICD-10-CM

## 2019-05-15 MED ORDER — LISINOPRIL 10 MG TAB
10 mg | ORAL_TABLET | Freq: Every day | ORAL | 0 refills | Status: DC
Start: 2019-05-15 — End: 2019-09-10

## 2019-05-15 MED ORDER — OZEMPIC 0.25 MG OR 0.5 MG (2 MG/1.5 ML) SUBCUTANEOUS PEN INJECTOR
0.25 mg or 0.5 mg(2 mg/1.5 mL) | PEN_INJECTOR | SUBCUTANEOUS | 0 refills | Status: AC
Start: 2019-05-15 — End: 2019-08-07
  Filled 2019-05-24: qty 6, 84d supply, fill #0

## 2019-05-15 NOTE — Progress Notes (Signed)
Alyssa [name] is a 39 y.o. female who was seen by synchronous (real-time) audio-video technology on 05/15/2019.      Consent: Earlyne Feeser, who was seen by synchronous (real-time) audio-video technology, and/or her healthcare decision maker, is aware that this patient-initiated, Telehealth encounter on 05/15/2019 is a billable service, with coverage as determined by her insurance carrier. She is aware that she may receive a bill and has provided verbal consent to proceed: Yes.    Assessment & Plan:       ICD-10-CM ICD-9-CM    1. Type 2 diabetes mellitus with complication, with long-term current use of insulin (HCC)  E11.8 250.90 semaglutide (Ozempic) 0.25 mg/0.2 mL (2 mg/1.5 mL) sub-q pen    Z79.4 V58.67    2. Obesity, morbid ([place])  E66.01 278.01 semaglutide (Ozempic) 0.25 mg/0.2 mL (2 mg/1.5 mL) sub-q pen   3. Essential hypertension  I10 401.9 semaglutide (Ozempic) 0.25 mg/0.2 mL (2 mg/1.5 mL) sub-q pen      lisinopriL (PRINIVIL, ZESTRIL) 10 mg tablet   4. Uncontrolled type 2 diabetes mellitus with complication (HCC)  B76.2 250.92 semaglutide (Ozempic) 0.25 mg/0.2 mL (2 mg/1.5 mL) sub-q pen    E11.65     5. Type 2 diabetes with nephropathy (HCC)  E11.21 250.40 semaglutide (Ozempic) 0.25 mg/0.2 mL (2 mg/1.5 mL) sub-q pen     583.81 lisinopriL (PRINIVIL, ZESTRIL) 10 mg tablet     HTN: on lisinopirl, no change, has nephropathy  DM2: not controlled  With insulin we are making progress  C/w insulin  Continue with metformin  Add ozempic as ordered--on the date you start ozempic, cut back insulin by 10 units, can then re titrate up so we don't "over shoot" pt understanding and agreeable to this plan  We decided NOT to use Jardiance/Invokana b/c pt has recurrent yeast vaginitis and is not a candidate for this  Further the ozempic is indicated given comorbidities  She will continue to work on wieght loss through diet, exercise and healthy habits, ozempic may help this goal as well         Pt was counseled on risks, benefits and alternatives of treatment options. All questions were asked and answered and the patient was agreeable with the treatment plan as outlined.    Encounter time today was 35 minutes and more than 50% of this encounter was spent in counseling face-to-face regarding Diagnosis, Patient Education, Medication Management, Compliance and Impressions.  Time documented includes face to face time, documentation and chart review if applicable except as noted.  Subjective:   Alyssa [name] is a 39 y.o. female who was seen for Labs (Follow up)      Lab Results   Component Value Date/Time    Hemoglobin A1c 10.2 (H) 05/13/2019 08:26 AM    Hemoglobin A1c 9.5 (H) 09/14/2018 02:42 PM    Hemoglobin A1c 12.7 (H) 06/07/2018 03:58 PM    Glucose 198 (H) 05/13/2019 08:26 AM    Glucose (POC) 255 (H) 10/26/2018 02:01 AM    Microalbumin/Creat ratio (mg/g creat) 19 05/13/2019 08:26 AM    Microalbumin,urine random 1.80 05/13/2019 08:26 AM    LDL, calculated 54 05/13/2019 08:26 AM    Creatinine 0.77 05/13/2019 08:26 AM     Key Antihyperglycemic Medications             semaglutide (Ozempic) 0.25 mg/0.2 mL (2 mg/1.5 mL) sub-q pen (Taking) 0.25 mg every seven (7) days for 28 days, THEN 0.5 mg every seven (7) days for 56 days.  metFORMIN ER (GLUCOPHAGE XR) 500 mg tablet (Taking) TAKE 2 TABLETS BY MOUTH DAILY WITH DINNER    insulin glargine (Lantus Solostar U-100 Insulin) 100 unit/mL (3 mL) inpn (Taking) INJECT 74 UNITS UNDER THE SKIN AT BEDTIME      since last visit got labs  a1c not at goal, pt reports she's motivated to change  Does want to change by being compliant with meds and diet before seeing endocrine  She has self-titrated insulin up to 80U qhs  Fasting sugars--this morning 97, yesterday was 116, this is a huge change for her, she knew something was wrong when she went to the hospital but until she was ready to make the change she needed to get back on that track to diabetic success     She is motivated to take meds, work on diet  She is cooking at home, making changes, trying to get the kids to eat with her and set that good example    bp is at goal, she is hypertensive and periodically checking bp     Medications, allergies, PMH, PSH, SOCH, [facility] reviewed and updated per routine protocol, see chart for review and changes if not noted here.    ROS  A 12 point review of systems was negative except as noted here or in the HPI.    Objective:   Vital Signs: (As obtained by patient/caregiver at home)  Patient-Reported Vitals 05/14/2019   Patient-Reported Weight 273   Patient-Reported Height 5'3   Patient-Reported Pulse 93   Patient-Reported Temperature 97.9   Patient-Reported SpO2 N/A   Patient-Reported Systolic  123456   Patient-Reported Diastolic 80   Patient-Reported Peak Flow N/A   Patient-Reported LMP N/A        [INSTRUCTIONS:  "[x] " Indicates a positive item  "[] " Indicates a negative item  -- DELETE ALL ITEMS NOT EXAMINED]    Constitutional: [x]  Appears well-developed and well-nourished [x]  No apparent distress      []  Abnormal -     Mental status: [x]  Alert and awake  [x]  Oriented to person/place/time [x]  Able to follow commands    []  Abnormal -     Eyes:   EOM    [x]   Normal    []  Abnormal -   Sclera  [x]   Normal    []  Abnormal -          Discharge [x]   None visible   []  Abnormal -     HENT: [x]  Normocephalic, atraumatic  []  Abnormal -   [x]  Mouth/Throat: Mucous membranes are moist    External Ears [x]  Normal  []  Abnormal -    Neck: [x]  No visualized mass []  Abnormal -     Pulmonary/Chest: [x]  Respiratory effort normal   [x]  No visualized signs of difficulty breathing or respiratory distress        []  Abnormal -      Musculoskeletal:   [x]  Normal gait with no signs of ataxia         [x]  Normal range of motion of neck        []  Abnormal -     Neurological:        [x]  No Facial Asymmetry (Cranial nerve 7 motor function) (limited exam due to video visit)          [x]  No gaze palsy         []  Abnormal -          Skin:        [x]   No significant exanthematous lesions or discoloration noted on facial skin         []  Abnormal -            Psychiatric:       [x]  Normal Affect []  Abnormal -        [x]  No Hallucinations    Other pertinent observable physical exam findings: able to ambulate, well appearing, obese    We discussed the expected course, resolution and complications of the diagnosis(es) in detail.  Medication risks, benefits, costs, interactions, and alternatives were discussed as indicated.  I advised her to contact the office if her condition worsens, changes or fails to improve as anticipated. She expressed understanding with the diagnosis(es) and plan.       Merrit Reichert is a 39 y.o. female who was evaluated by a video visit encounter for concerns as above. Patient identification was verified prior to start of the visit. A caregiver was present when appropriate. Due to this being a Scientist, physiological (During [id number] public health emergency), evaluation of the following organ systems was limited: Vitals/Constitutional/EENT/Resp/CV/GI/GU/MS/Neuro/Skin/Heme-Lymph-Imm.  Pursuant to the emergency declaration under the [place], 1135 waiver authority and the [employer] Act, this Virtual  Visit was conducted, with patient's (and/or legal guardian's) consent, to reduce the patient's risk of exposure to COVID-19 and provide necessary medical care.     Services were provided through a video synchronous discussion virtually to substitute for in-person clinic visit.   Patient and provider were located at their individual homes.      Doneen Poisson, MD  [facility]  05/15/19 2:11 PM     Portions of this note may have been populated using smart dictation software and may have "sounds-like" errors present.

## 2019-05-15 NOTE — Progress Notes (Signed)
Chief Complaint   Patient presents with   ??? Labs     Follow up     1. Have you been to the ER, urgent care clinic since your last visit?  Hospitalized since your last visit? Yes, 04/09/2019 [facility] ER, elevated glucose.    2. Have you seen or consulted any other health care providers outside of the [facility] since your last visit?  Include any pap smears or colon screening. No

## 2019-05-15 NOTE — Telephone Encounter (Signed)
-----   Message from Madelin Rear, MD sent at 05/15/2019  2:28 PM EST -----  Regarding: pls schedule  Fingerstick a1c  4/27  IN PERSON  3pm

## 2019-05-15 NOTE — Telephone Encounter (Signed)
Pt has been scheduled.

## 2019-05-15 NOTE — Progress Notes (Signed)
Alyssa [name] is a 39 y.o. female who was seen by synchronous (real-time) audio-video technology on 05/15/2019.      Consent: Alyssa [name], who was seen by synchronous (real-time) audio-video technology, and/or her healthcare decision maker, is aware that this patient-initiated, Telehealth encounter on 05/15/2019 is a billable service, with coverage as determined by her insurance carrier. She is aware that she may receive a bill and has provided verbal consent to proceed: Yes.    Assessment & Plan:       ICD-10-CM ICD-9-CM    1. Type 2 diabetes mellitus with complication, with long-term current use of insulin (HCC)  E11.8 250.90 semaglutide (Ozempic) 0.25 mg/0.2 mL (2 mg/1.5 mL) sub-q pen    Z79.4 V58.67    2. Obesity, morbid ([place])  E66.01 278.01 semaglutide (Ozempic) 0.25 mg/0.2 mL (2 mg/1.5 mL) sub-q pen   3. Essential hypertension  I10 401.9 semaglutide (Ozempic) 0.25 mg/0.2 mL (2 mg/1.5 mL) sub-q pen      lisinopriL (PRINIVIL, ZESTRIL) 10 mg tablet   4. Uncontrolled type 2 diabetes mellitus with complication (HCC)  Q000111Q 250.92 semaglutide (Ozempic) 0.25 mg/0.2 mL (2 mg/1.5 mL) sub-q pen    E11.65     5. Type 2 diabetes with nephropathy (HCC)  E11.21 250.40 semaglutide (Ozempic) 0.25 mg/0.2 mL (2 mg/1.5 mL) sub-q pen     583.81 lisinopriL (PRINIVIL, ZESTRIL) 10 mg tablet     HTN: on lisinopirl, no change, has nephropathy  DM2: not controlled  With insulin we are making progress  C/w insulin  Continue with metformin  Add ozempic as ordered--on the date you start ozempic, cut back insulin by 10 units, can then re titrate up so we don't "over shoot" pt understanding and agreeable to this plan  We decided NOT to use Jardiance/Invokana b/c pt has recurrent yeast vaginitis and is not a candidate for this  Further the ozempic is indicated given comorbidities  She will continue to work on wieght loss through diet, exercise and healthy habits, ozempic may help this goal as well        Pt was counseled on risks, benefits  and alternatives of treatment options. All questions were asked and answered and the patient was agreeable with the treatment plan as outlined.    Encounter time today was 35 minutes and more than 50% of this encounter was spent in counseling face-to-face regarding Diagnosis, Patient Education, Medication Management, Compliance and Impressions.  Time documented includes face to face time, documentation and chart review if applicable except as noted.  Subjective:   Alyssa [name] is a 39 y.o. female who was seen for Labs (Follow up)      Lab Results   Component Value Date/Time    Hemoglobin A1c 10.2 (H) 05/13/2019 08:26 AM    Hemoglobin A1c 9.5 (H) 09/14/2018 02:42 PM    Hemoglobin A1c 12.7 (H) 06/07/2018 03:58 PM    Glucose 198 (H) 05/13/2019 08:26 AM    Glucose (POC) 255 (H) 10/26/2018 02:01 AM    Microalbumin/Creat ratio (mg/g creat) 19 05/13/2019 08:26 AM    Microalbumin,urine random 1.80 05/13/2019 08:26 AM    LDL, calculated 54 05/13/2019 08:26 AM    Creatinine 0.77 05/13/2019 08:26 AM     Key Antihyperglycemic Medications             semaglutide (Ozempic) 0.25 mg/0.2 mL (2 mg/1.5 mL) sub-q pen (Taking) 0.25 mg every seven (7) days for 28 days, THEN 0.5 mg every seven (7) days for 56 days.  metFORMIN ER (GLUCOPHAGE XR) 500 mg tablet (Taking) TAKE 2 TABLETS BY MOUTH DAILY WITH DINNER    insulin glargine (Lantus Solostar U-100 Insulin) 100 unit/mL (3 mL) inpn (Taking) INJECT 74 UNITS UNDER THE SKIN AT BEDTIME      since last visit got labs  a1c not at goal, pt reports she's motivated to change  Does want to change by being compliant with meds and diet before seeing endocrine  She has self-titrated insulin up to 80U qhs  Fasting sugars--this morning 97, yesterday was 116, this is a huge change for her, she knew something was wrong when she went to the hospital but until she was ready to make the change she needed to get back on that track to diabetic success    She is motivated to take meds, work on diet  She is  cooking at home, making changes, trying to get the kids to eat with her and set that good example    bp is at goal, she is hypertensive and periodically checking bp     Medications, allergies, PMH, PSH, SOCH, [facility] reviewed and updated per routine protocol, see chart for review and changes if not noted here.    ROS  A 12 point review of systems was negative except as noted here or in the HPI.    Objective:   Vital Signs: (As obtained by patient/caregiver at home)  Patient-Reported Vitals 05/14/2019   Patient-Reported Weight 273   Patient-Reported Height 5'3   Patient-Reported Pulse 93   Patient-Reported Temperature 97.9   Patient-Reported SpO2 N/A   Patient-Reported Systolic  123456   Patient-Reported Diastolic 80   Patient-Reported Peak Flow N/A   Patient-Reported LMP N/A        [INSTRUCTIONS:  "[x] " Indicates a positive item  "[] " Indicates a negative item  -- DELETE ALL ITEMS NOT EXAMINED]    Constitutional: [x]  Appears well-developed and well-nourished [x]  No apparent distress      []  Abnormal -     Mental status: [x]  Alert and awake  [x]  Oriented to person/place/time [x]  Able to follow commands    []  Abnormal -     Eyes:   EOM    [x]   Normal    []  Abnormal -   Sclera  [x]   Normal    []  Abnormal -          Discharge [x]   None visible   []  Abnormal -     HENT: [x]  Normocephalic, atraumatic  []  Abnormal -   [x]  Mouth/Throat: Mucous membranes are moist    External Ears [x]  Normal  []  Abnormal -    Neck: [x]  No visualized mass []  Abnormal -     Pulmonary/Chest: [x]  Respiratory effort normal   [x]  No visualized signs of difficulty breathing or respiratory distress        []  Abnormal -      Musculoskeletal:   [x]  Normal gait with no signs of ataxia         [x]  Normal range of motion of neck        []  Abnormal -     Neurological:        [x]  No Facial Asymmetry (Cranial nerve 7 motor function) (limited exam due to video visit)          [x]  No gaze palsy        []  Abnormal -          Skin:        [x]  No  significant  exanthematous lesions or discoloration noted on facial skin         []  Abnormal -            Psychiatric:       [x]  Normal Affect []  Abnormal -        [x]  No Hallucinations    Other pertinent observable physical exam findings: able to ambulate, well appearing, obese    We discussed the expected course, resolution and complications of the diagnosis(es) in detail.  Medication risks, benefits, costs, interactions, and alternatives were discussed as indicated.  I advised her to contact the office if her condition worsens, changes or fails to improve as anticipated. She expressed understanding with the diagnosis(es) and plan.       Alyssa [name] is a 39 y.o. female who was evaluated by a video visit encounter for concerns as above. Patient identification was verified prior to start of the visit. A caregiver was present when appropriate. Due to this being a Scientist, physiological (During [id number] public health emergency), evaluation of the following organ systems was limited: Vitals/Constitutional/EENT/Resp/CV/GI/GU/MS/Neuro/Skin/Heme-Lymph-Imm.  Pursuant to the emergency declaration under the [place], 1135 waiver authority and the [employer] Act, this Virtual  Visit was conducted, with patient's (and/or legal guardian's) consent, to reduce the patient's risk of exposure to COVID-19 and provide necessary medical care.     Services were provided through a video synchronous discussion virtually to substitute for in-person clinic visit.   Patient and provider were located at their individual homes.      Doneen Poisson, MD  [facility]  05/15/19 2:11 PM     Portions of this note may have been populated using smart dictation software and may have "sounds-like" errors present.

## 2019-05-15 NOTE — Progress Notes (Signed)
 Chief Complaint   Patient presents with   . Labs     Follow up     1. Have you been to the ER, urgent care clinic since your last visit?  Hospitalized since your last visit? Yes, 04/09/2019 [facility] ER, elevated glucose.    2. Have you seen or consulted any other health care providers outside of the [facility] since your last visit?  Include any pap smears or colon screening. No

## 2019-05-16 MED ORDER — LANTUS SOLOSTAR U-100 INSULIN 100 UNIT/ML (3 ML) SUBCUTANEOUS PEN
100 unit/mL (3 mL) | PEN_INJECTOR | SUBCUTANEOUS | 5 refills | Status: DC
Start: 2019-05-16 — End: 2020-07-14
  Filled 2019-05-27: qty 45, 56d supply, fill #0

## 2019-05-21 MED FILL — LEADER PEN NEEDLE 31G X 8MM (5/16""): 90 days supply | Qty: 300 | Fill #1 | Status: AC

## 2019-05-23 ENCOUNTER — Ambulatory Visit: Attending: Obstetrics & Gynecology | Primary: Family Medicine

## 2019-05-23 ENCOUNTER — Encounter: Primary: Family Medicine

## 2019-05-23 ENCOUNTER — Ambulatory Visit
Admit: 2019-05-23 | Discharge: 2019-05-23 | Payer: PRIVATE HEALTH INSURANCE | Attending: Obstetrics & Gynecology | Primary: Family Medicine

## 2019-05-23 DIAGNOSIS — Z01419 Encounter for gynecological examination (general) (routine) without abnormal findings: Secondary | ICD-10-CM

## 2019-05-23 MED ORDER — CICLOPIROX 0.77 % TOPICAL GEL
0.77 % | Freq: Two times a day (BID) | CUTANEOUS | 2 refills | Status: DC
Start: 2019-05-23 — End: 2019-07-10
  Filled 2019-05-27: qty 45, 3d supply, fill #0

## 2019-05-23 MED ORDER — FLUCONAZOLE 200 MG TAB
200 mg | ORAL_TABLET | ORAL | 3 refills | Status: DC
Start: 2019-05-23 — End: 2019-07-10
  Filled 2019-05-24: qty 18, 84d supply, fill #0

## 2019-05-23 NOTE — Progress Notes (Signed)
Briefly spoke with patient who stated she was at the doctor's office and requested to call me back.  Will await call back.
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
## 2019-05-23 NOTE — Patient Instructions (Signed)
Well Visit, Ages 18 to 50: Care Instructions  Your Care Instructions     Physical exams can help you stay healthy. Your doctor has checked your overall health and may have suggested ways to take good care of yourself. He or she also may have recommended tests. At home, you can help prevent illness with healthy eating, regular exercise, and other steps.  Follow-up care is a key part of your treatment and safety. Be sure to make and go to all appointments, and call your doctor if you are having problems. It's also a good idea to know your test results and keep a list of the medicines you take.  How can you care for yourself at home?  ?? Reach and stay at a healthy weight. This will lower your risk for many problems, such as obesity, diabetes, heart disease, and high blood pressure.  ?? Get at least 30 minutes of physical activity on most days of the week. Walking is a good choice. You also may want to do other activities, such as running, swimming, cycling, or playing tennis or team sports. Discuss any changes in your exercise program with your doctor.  ?? Do not smoke or allow others to smoke around you. If you need help quitting, talk to your doctor about stop-smoking programs and medicines. These can increase your chances of quitting for good.  ?? Talk to your doctor about whether you have any risk factors for sexually transmitted infections (STIs). Having one sex partner (who does not have STIs and does not have sex with anyone else) is a good way to avoid these infections.  ?? Use birth control if you do not want to have children at this time. Talk with your doctor about the choices available and what might be best for you.  ?? Protect your skin from too much sun. When you're outdoors from 10 a.m. to 4 p.m., stay in the shade or cover up with clothing and a hat with a wide brim. Wear sunglasses that block UV rays. Even when it's cloudy, put broad-spectrum sunscreen (SPF 30 or higher) on any exposed skin.   ?? See a dentist one or two times a year for checkups and to have your teeth cleaned.  ?? Wear a seat belt in the car.  Follow your doctor's advice about when to have certain tests. These tests can spot problems early.  For everyone  ?? Cholesterol. Have the fat (cholesterol) in your blood tested after age 20. Your doctor will tell you how often to have this done based on your age, family history, or other things that can increase your risk for heart disease.  ?? Blood pressure. Have your blood pressure checked during a routine doctor visit. Your doctor will tell you how often to check your blood pressure based on your age, your blood pressure results, and other factors.  ?? Vision. Talk with your doctor about how often to have a glaucoma test.  ?? Diabetes. Ask your doctor whether you should have tests for diabetes.  ?? Colon cancer. Your risk for colorectal cancer gets higher as you get older. Some experts say that adults should start regular screening at age 50 and stop at age 75. Others say to start before age 50 or continue after age 75. Talk with your doctor about your risk and when to start and stop screening.  For women  ?? Breast exam and mammogram. Talk to your doctor about when you should have a clinical breast exam and   a mammogram. Medical experts differ on whether and how often women under 50 should have these tests. Your doctor can help you decide what is right for you.  ?? Cervical cancer screening test and pelvic exam. Begin with a Pap test at age 21. The test often is part of a pelvic exam. Starting at age 30, you may choose to have a Pap test, an HPV test, or both tests at the same time (called co-testing). Talk with your doctor about how often to have testing.  ?? Tests for sexually transmitted infections (STIs). Ask whether you should have tests for STIs. You may be at risk if you have sex with more than one person, especially if your partners do not wear condoms.  For men   ?? Tests for sexually transmitted infections (STIs). Ask whether you should have tests for STIs. You may be at risk if you have sex with more than one person, especially if you do not wear a condom.  ?? Testicular cancer exam. Ask your doctor whether you should check your testicles regularly.  ?? Prostate exam. Talk to your doctor about whether you should have a blood test (called a PSA test) for prostate cancer. Experts differ on whether and when men should have this test. Some experts suggest it if you are older than 45 and are African-American or have a father or brother who got prostate cancer when he was younger than 65.  When should you call for help?  Watch closely for changes in your health, and be sure to contact your doctor if you have any problems or symptoms that concern you.  Where can you learn more?  Go to https://www.healthwise.net/[facility]  Enter P072 in the search box to learn more about "Well Visit, Ages 18 to 50: Care Instructions."  Current as of: Sep 12, 2018??????????????????????????????Content Version: 12.6  ?? 2006-2020 [employer].   Care instructions adapted under license by [employer] (which disclaims liability or warranty for this information). If you have questions about a medical condition or this instruction, always ask your healthcare professional. [employer]wise, [employer] disclaims any warranty or liability for your use of this information.
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
## 2019-05-24 NOTE — Telephone Encounter (Signed)
PA for Ozempic has been submitted.

## 2019-05-29 ENCOUNTER — Inpatient Hospital Stay: Admit: 2019-05-29 | Payer: PRIVATE HEALTH INSURANCE | Primary: Family Medicine

## 2019-05-29 ENCOUNTER — Encounter

## 2019-05-29 ENCOUNTER — Ambulatory Visit: Admit: 2019-05-29 | Discharge: 2019-05-29 | Payer: PRIVATE HEALTH INSURANCE | Primary: Family Medicine

## 2019-05-29 DIAGNOSIS — Z1231 Encounter for screening mammogram for malignant neoplasm of breast: Secondary | ICD-10-CM

## 2019-05-29 LAB — PAP IG, APTIMA HPV AND RFX 16/18,45 (199305)
HPV Aptima: NEGATIVE
LABCORP 019018: 0

## 2019-05-29 LAB — PAP IG, APTIMA HPV AND RFX 16/18,45 (507805)
.: 0
HPV APTIMA: NEGATIVE

## 2019-05-31 NOTE — Telephone Encounter (Signed)
Per [employer], no PA is needed for pt's Ozempic.

## 2019-06-20 MED ORDER — DEXCOM G6 TRANSMITTER DEVICE
5 refills | Status: DC
Start: 2019-06-20 — End: 2020-04-21

## 2019-06-20 MED ORDER — DEXCOM G6 RECEIVER
5 refills | Status: DC
Start: 2019-06-20 — End: 2020-04-21

## 2019-06-20 MED ORDER — DEXCOM G6 SENSOR DEVICE
2 refills | Status: DC
Start: 2019-06-20 — End: 2020-04-21

## 2019-06-20 NOTE — Telephone Encounter (Addendum)
Freestyle Libre 14 day Sensor   [employer] is unable to respond with clinical questions. Please see more information at the bottom of the page for next steps.   A new prior authorization (PA) request cannot be started at this time because this member has other primary health insurance    PA has been sent, via cover my meds to [place].     A new prior authorization (PA) request cannot be started at this time because this member has other primary health insurance. Please contact the PA call center if you have questions on the status of this request      Dexcom  [employer] is unable to respond with clinical questions. Please see more information at the bottom of the page for next steps.       PA has beensent, via cover my meds to [employer]    A new prior authorization (PA) request cannot be started at this time because this member has other primary health insurance. Please contact the PA call center if you have questions on the status of this request

## 2019-06-20 NOTE — Telephone Encounter (Signed)
From: [name]  To: Madelin Rear, MD  Sent: 06/05/2019 3:15 PM EST  Subject: Update Medical Information    Went to [facility] in [place] and could not get the [place] because Medicaid needs information from you. The pharmacy sent a notification to you.

## 2019-06-28 NOTE — Telephone Encounter (Signed)
Pt's Dexcom G6 has been approved.   06/17/2019-06/24/2020.    Pt advised via mychart.

## 2019-06-28 NOTE — Progress Notes (Signed)
Follow up phone call to patient, two pt identifiers verified. Discussed patient's goals:   Goals     ??? Completes all follow-up appointments      ??? Educate and encourage importance of FU for prevention of complications or disease;  ??? Assess the patient's relationship with a PCP and next FU visit scheduled;  o States she will call on Monday to make her FU appt.  ??? Discuss importance of adherence to treatment plan and follow up visits;  ??? Identify any barriers in transportation or access to FU appointments.   ??? Assist patient with making FU appointments as needed;   ??? It's also a good idea to know your test results.   ??? Keep a list of the medicines you take.  7/20: Patient states she did get an appointment to see her PCP.  Has cardio appt on 8/6.  7/30: Follow up - Cardio 8/6.  Did not see her PCP because her chest pain ended up being muscular.  8/20: Follow completed.  Now seeing a chiropractor for back pain.  9/24: PCP in six weeks.    11/19: She has not made a f/up yet because of covid.  Has an elderly aunt who lives with her and does not want to expose any of her family members.  Encouraged her to continue to follow healthy diet.  12/17: States doing well with diet.  Keeping f/up visits.  1/14: had follow up with her PCP yesterday, going to get labs done on 1/15.  Will make a follow up appointment on 1/19.    3/12: Has f/up with Dr. Aleene Davidson on 4/27.      ??? Demonstrates self-management strategies and behaviors for a healthy lifestylenter goal here      ??? Verbalizes importance of a healthy lifestyle and impacts it has on health  o can lower risks for serious health problems such as high blood pressure, heart disease and diabetes;  o can prevent complications or exacerbations of chronic health conditions like Diabetes;  - Has been on decreased carb diet, A1C down to 9.5 from 12.7 in about 6 months  ??? Recognizes importance of completing follow up appointments with PCP and specialists;  ??? Discussed healthy diet options    o generally: protein source (deck of cards = 3 ounces), five servings of fruits and vegetables (a piece of fruit or cup of vegetables) with every meal;  ??? Sleep 7-8 hours per night;  ??? Verbalizes importance of exercise as part of a daily routine   8/20: She hurt her back at work.  Did not file worker's comp claim because she didn't want to be told she would have to do covid testing to come back to work.  She is seeing a chiropractor regularly and is icing for 12 minutes every three to four.  9/24: Patient described meals she is eating - high protein, low carbs - Breakfast - eggs, cheese and shrimp or salmon, healthy snacks for lunch, dinner ground beef w/ cabbage and potatoes.  Drinking water excessively, average 130 ml/day.  Her provider is aware.  Plans to go to her PCP for A1C check in about 6 weeks.  10/8: States she had salmon and eggs this morning for breakfast. States diet has been going well.  Leaves to go to [place], [place] on 10/16 to visit family.  Encouraged her to plan meals.    11/19: Patient states she just had an eye appointment and eyes are healthy.  No effects of diabetes.  She  is still maintaining a healthy diet.  States she is eating egg and shrimp for breakfast.  States her son is becoming engaged in better diet.  12/17: Diet remains on track.  Likes my calls for accountability.  Asked me to keep calling until March.  Provided support and encouragement.    1/14: States she had a spell of eating bad. Had shrimp and eggs for breakfast, dinner melanesia - ([place]), provided support and encouragement.  2/12: Eating salmon and eggs again.  Kids at home, going back to school in September.  Sleeping 6-7 hours a night. Migraine for last two days, now dull headache. Has not taken her Excedrin today, took last two days. Still doing good with water intake.   3/12: Patient states she is doing well.  No issues or concerns.            Patient's primary care provider relationship reviewed with patient and  modified, as applicable.        Upcoming appointments:   Future Appointments   Date Time Provider [place]   08/13/2019  3:00 PM Madelin Rear, MD [facility]     Plan for next call: Will call in one month, 4/9.

## 2019-06-28 NOTE — Progress Notes (Signed)
 Follow up phone call to patient, two pt identifiers verified. Discussed patient's goals:   Goals     . Completes all follow-up appointments      . Educate and encourage importance of FU for prevention of complications or disease;  . Assess the patient's relationship with a PCP and next FU visit scheduled;  o States she will call on Monday to make her FU appt.  . Discuss importance of adherence to treatment plan and follow up visits;  . Identify any barriers in transportation or access to FU appointments.   . Assist patient with making FU appointments as needed;   . It's also a good idea to know your test results.   Alyssa [name] Keep a list of the medicines you take.  7/20: Patient states she did get an appointment to see her PCP.  Has cardio appt on 8/6.  7/30: Follow up - Cardio 8/6.  Did not see her PCP because her chest pain ended up being muscular.  8/20: Follow completed.  Now seeing a chiropractor for back pain.  9/24: PCP in six weeks.    11/19: She has not made a f/up yet because of covid.  Has an elderly aunt who lives with her and does not want to expose any of her family members.  Encouraged her to continue to follow healthy diet.  12/17: States doing well with diet.  Keeping f/up visits.  1/14: had follow up with her PCP yesterday, going to get labs done on 1/15.  Will make a follow up appointment on 1/19.    3/12: Has f/up with Dr. Olita on 4/27.      Alyssa [name] Demonstrates self-management strategies and behaviors for a healthy lifestylenter goal here      . Verbalizes importance of a healthy lifestyle and impacts it has on health  o can lower risks for serious health problems such as high blood pressure, heart disease and diabetes;  o can prevent complications or exacerbations of chronic health conditions like Diabetes;  - Has been on decreased carb diet, A1C down to 9.5 from 12.7 in about 6 months  . Recognizes importance of completing follow up appointments with PCP and specialists;  Alyssa [name] Discussed healthy diet options    o generally: protein source (deck of cards = 3 ounces), five servings of fruits and vegetables (a piece of fruit or cup of vegetables) with every meal;  . Sleep 7-8 hours per night;  Alyssa [name] [place] importance of exercise as part of a daily routine   8/20: She hurt her back at work.  Did not file worker's comp claim because she didn't want to be told she would have to do covid testing to come back to work.  She is seeing a chiropractor regularly and is icing for 12 minutes every three to four.  9/24: Patient described meals she is eating - high protein, low carbs - Breakfast - eggs, cheese and shrimp or salmon, healthy snacks for lunch, dinner ground beef w/ cabbage and potatoes.  Drinking water  excessively, average 130 ml/day.  Her provider is aware.  Plans to go to her PCP for A1C check in about 6 weeks.  10/8: States she had salmon and eggs this morning for breakfast. States diet has been going well.  Leaves to go to [place], [place] on 10/16 to visit family.  Encouraged her to plan meals.    11/19: Patient states she just had an eye appointment and eyes are healthy.  No effects of diabetes.  She  is still maintaining a healthy diet.  States she is eating egg and shrimp for breakfast.  States her son is becoming engaged in better diet.  12/17: Diet remains on track.  Likes my calls for accountability.  Asked me to keep calling until March.  Provided support and encouragement.    1/14: States she had a spell of eating bad. Had shrimp and eggs for breakfast, dinner melanesia - ([place]), provided support and encouragement.  2/12: Eating salmon and eggs again.  Kids at home, going back to school in September.  Sleeping 6-7 hours a night. Migraine for last two days, now dull headache. Has not taken her Excedrin today, took last two days. Still doing good with water  intake.   3/12: Patient states she is doing well.  No issues or concerns.            Patient's primary care provider relationship reviewed with patient and  modified, as applicable.        Upcoming appointments:   Future Appointments   Date Time Provider Department Center   08/13/2019  3:00 PM Dillon, Caitlin K, MD [facility]     Plan for next call: Will call in one month, 4/9.

## 2019-07-10 ENCOUNTER — Inpatient Hospital Stay
Admit: 2019-07-10 | Discharge: 2019-07-11 | Disposition: A | Discharge status: Home / Self Care | Payer: PRIVATE HEALTH INSURANCE | Attending: Emergency Medicine

## 2019-07-10 DIAGNOSIS — S39012A Strain of muscle, fascia and tendon of lower back, initial encounter: Secondary | ICD-10-CM

## 2019-07-10 MED ORDER — METHOCARBAMOL 500 MG TAB
500 mg | ORAL_TABLET | Freq: Four times a day (QID) | ORAL | 0 refills | Status: AC | PRN
Start: 2019-07-10 — End: 2019-07-15
  Filled 2019-07-11: qty 20, 5d supply, fill #0

## 2019-07-10 MED ORDER — IBUPROFEN 800 MG TAB
800 mg | ORAL_TABLET | Freq: Four times a day (QID) | ORAL | 0 refills | Status: DC | PRN
Start: 2019-07-10 — End: 2020-06-23
  Filled 2019-07-11: qty 30, 8d supply, fill #0

## 2019-07-10 MED ORDER — METHOCARBAMOL 500 MG TAB
500 mg | Freq: Once | ORAL | Status: AC
Start: 2019-07-10 — End: 2019-07-10
  Administered 2019-07-11: via ORAL

## 2019-07-10 MED ORDER — LIDOCAINE 5 % (700 MG/PATCH) ADHESIVE PATCH
5 % | MEDICATED_PATCH | CUTANEOUS | 0 refills | Status: DC
Start: 2019-07-10 — End: 2019-10-09
  Filled 2019-07-11: qty 5, 5d supply, fill #0

## 2019-07-10 MED ORDER — LIDOCAINE 4 % TOPICAL PATCH (12 HOUR DURATION)
4 % | CUTANEOUS | Status: DC
Start: 2019-07-10 — End: 2019-07-10

## 2019-07-10 MED ORDER — IBUPROFEN 800 MG TAB
800 mg | ORAL | Status: AC
Start: 2019-07-10 — End: 2019-07-10
  Administered 2019-07-11: via ORAL

## 2019-07-10 MED ORDER — ACETAMINOPHEN 500 MG TAB
500 mg | ORAL | Status: AC
Start: 2019-07-10 — End: 2019-07-10
  Administered 2019-07-11: via ORAL

## 2019-07-10 MED ORDER — ACETAMINOPHEN 500 MG TAB
500 mg | ORAL_TABLET | Freq: Three times a day (TID) | ORAL | 0 refills | Status: AC
Start: 2019-07-10 — End: 2019-07-14
  Filled 2019-07-11: qty 24, 6d supply, fill #0
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
## 2019-07-10 NOTE — ED Triage Notes (Signed)
Pt has pain in her back/neck since yesterday.  Pt denies injury.

## 2019-07-10 NOTE — ED Notes (Signed)
Pt has pain in her back/neck since yesterday.  Pt denies injury.
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
## 2019-07-11 MED FILL — ACETAMINOPHEN 500 MG TAB: 500 mg | ORAL | Qty: 2

## 2019-07-11 MED FILL — METHOCARBAMOL 500 MG TAB: 500 mg | ORAL | Qty: 1

## 2019-07-11 MED FILL — LIDOCAINE PAIN RELIEF 4 % TOPICAL PATCH: 4 % | CUTANEOUS | Qty: 1

## 2019-07-11 MED FILL — IBUPROFEN 800 MG TAB: 800 mg | ORAL | Qty: 1

## 2019-07-11 NOTE — Telephone Encounter (Signed)
[facility].   Advised pharmacist RX was approved.   She advises pt's primary is covering, but Medicaid is not, and I need to complete a PA through Medicaid.       Attempted A PA through [place] new prior authorization (PA) request cannot be started at this time because this member has other primary health insurance    Called Pharmacist back to advise.   She, confirms on her end the same.   Primary has approved and secondary is not, but there is no way around getting RX approved.     She will advise pt of remainder balance for meter.

## 2019-07-11 NOTE — Telephone Encounter (Signed)
Dexcom G6 Sensor not covered under pt's insurance. Prior auth or covered alternative required. Ldm

## 2019-07-11 NOTE — Telephone Encounter (Signed)
This is frustrating. I originally asked for a freestyle libre, which I wonder if medicaid would cover but [place] would not?  Patient may benefit from seeing endo for the care coordination aspect at this point, we can offer if she calls back

## 2019-07-11 NOTE — Progress Notes (Signed)
CCM follow up.    * Covering for ACM Syliva Overman, RN    Introduced myself as covering for [employer], ACM RN.    Received notification of discharge from [facility] ED on 07/10/19 for Back Strain.    Contacted patient to discuss post discharge needs Two patient identifiers verified.     *Spoke with patient who reports that she is not having Back Pain at this time.  Rates pain as 0 on 0-10 pain scale. Taking Medications as directed which is helping with pain control.  Using the Lidocaine Patch correctly  - 12 hours on and 12 hours off.  Has not tested BS this AM - -States that she needs to go pick up her Animator at the her Pharmacy today.    *Asked if she needed assistance making follow appointment with her PCP or Ortho and she states not at this time. If she does not get better will make appointments.    Orthopedic Condition Focused Assessment  Have you recently had any of the following?    Any recent changes in activity no  Mobility or assistive device in place no  DME needs addressed no  PT/OT/ST ordered not at this time  Pain rated as 0/10 to Back described as pain free with medication/patch  Numbness or tingling no  Weakness no  Trouble with coordinating your movement, or change in gait no  New or worsening pain to calf, back of knee or groin no   Redness, swelling to leg or groin no  Fever no  Nausea no  Vomiting no  Chills or clammy skin no  Change in urine output no  Recent change in urinary or bowel continence no  Change in speech no  Difficulty swallowing no  Dizziness/lightheadedness no  Sleep disturbance no     Visual changes no  Medication changes? No    Red Flags:  Call your doctor now or seek immediate medical care if:  You get muscle cramps often that do not go away after home treatment.  Your muscle cramps often wake you up at night.  You have new or worsening numbness in your legs.  You have new or worsening weakness in your legs. (This could make it hard to stand up.)  You lose  control of your bladder or bowels.  You have a fever, lose weight, or don't feel well.    Medications:  New Medications at Discharge:   acetaminophen 500 mg tablet  ??? ibuprofen 800 mg tablet  ??? lidocaine 5 %  ??? methocarbamoL 500 mg tablet     Changed Medications at Discharge:  CHANGE how you take:  ibuprofen (MOTRIN)    Discontinued Medications at Discharge:   STOP taking:  ciclopirox 0.77 % topical gel (LOPROX)  COLLAGEN  fluconazole 200 mg tablet (DIFLUCAN)  loteprednol etabonate 0.5 % ophthalmic  suspension (LOTEMAX)  naproxen 500 mg tablet (NAPROSYN)     Reviewed red flags with patient, and patient verbalizes understanding.  Patient given an opportunity to ask questions. No other clinical/social/functional needs noted.     The patient agrees to contact the PCP office for questions related to their healthcare.  The patient expressed thanks, offered no additional questions and ended the call.      Patient had no other questions or concerns. Contact information provided and reminded her that I can be reached if any needs arise or if unable to reach ACM.     Plan for next call: Informed patient that  her ACM Angie will follow up. Patient verbalized understanding.

## 2019-07-11 NOTE — Progress Notes (Signed)
 CCM follow up.    * Covering for ACM [place] Ferrier, RN    Introduced myself as covering for [employer], ACM RN.    Received notification of discharge from [facility] ED on 07/10/19 for Back Strain.    Contacted patient to discuss post discharge needs Two patient identifiers verified.     *Spoke with patient who reports that she is not having Back Pain at this time.  Rates pain as 0 on 0-10 pain scale. Taking Medications as directed which is helping with pain control.  Using the Lidocaine  Patch correctly  - 12 hours on and 12 hours off.  Has not tested BS this AM - -States that she needs to go pick up her Dexcom Sensor and Transmitter at the her Pharmacy today.    *Asked if she needed assistance making follow appointment with her PCP or Ortho and she states not at this time. If she does not get better will make appointments.    Orthopedic Condition Focused Assessment  Have you recently had any of the following?    Any recent changes in activity no  Mobility or assistive device in place no  DME needs addressed no  PT/OT/ST ordered not at this time  Pain rated as 0/10 to Back described as pain free with medication/patch  Numbness or tingling no  Weakness no  Trouble with coordinating your movement, or change in gait no  New or worsening pain to calf, back of knee or groin no   Redness, swelling to leg or groin no  Fever no  Nausea no  Vomiting no  Chills or clammy skin no  Change in urine output no  Recent change in urinary or bowel continence no  Change in speech no  Difficulty swallowing no  Dizziness/lightheadedness no  Sleep disturbance no     Visual changes no  Medication changes? No    Red Flags:  Call your doctor now or seek immediate medical care if:  You get muscle cramps often that do not go away after home treatment.  Your muscle cramps often wake you up at night.  You have new or worsening numbness in your legs.  You have new or worsening weakness in your legs. (This could make it hard to stand up.)  You lose  control of your bladder or bowels.  You have a fever, lose weight, or don't feel well.    Medications:  New Medications at Discharge:   acetaminophen  500 mg tablet  . ibuprofen  800 mg tablet  . lidocaine  5 %  . methocarbamoL 500 mg tablet     Changed Medications at Discharge:  CHANGE how you take:  ibuprofen  (MOTRIN )    Discontinued Medications at Discharge:   STOP taking:  ciclopirox 0.77 % topical gel (LOPROX)  COLLAGEN  fluconazole 200 mg tablet (DIFLUCAN)  loteprednol etabonate 0.5 % ophthalmic  suspension (LOTEMAX)  naproxen 500 mg tablet (NAPROSYN)     Reviewed red flags with patient, and patient verbalizes understanding.  Patient given an opportunity to ask questions. No other clinical/social/functional needs noted.     The patient agrees to contact the PCP office for questions related to their healthcare.  The patient expressed thanks, offered no additional questions and ended the call.      Patient had no other questions or concerns. Contact information provided and reminded her that I can be reached if any needs arise or if unable to reach ACM.     Plan for next call: Informed patient that  her ACM Angie will follow up. Patient verbalized understanding.

## 2019-07-23 NOTE — Telephone Encounter (Signed)
Incoming call regarding enrollment for DM program.  Already utilizing [employer].  Would like financial assistance with program with Dexcom.    Emailed enrollment forms for both using email on file.    Routed to Civil engineer, contracting for tracking purposes.

## 2019-07-24 NOTE — Telephone Encounter (Signed)
Pharmacy Pop Care Documentation:   Patient is missing the following requirements: DM Program Application.  If completed by 08/11/19 patient will be eligible for enrollment in the DM Program on 08/17/19.    Alyssa [name], [place] [facility]  Department, toll free: [phone], option 7

## 2019-07-26 NOTE — Progress Notes (Signed)
Attempt to reach patient for follow up. Discreet VM left with contact information. Will reach out again in three weeks, 4/30.  Per chart review, patient has an appointment with her PCP on 4/27.

## 2019-07-26 NOTE — Progress Notes (Signed)
Follow up phone call to patient, two pt identifiers verified. Discussed patient's goals:   Goals     ??? Completes all follow-up appointments      ??? Educate and encourage importance of FU for prevention of complications or disease;  ??? Assess the patient's relationship with a PCP and next FU visit scheduled;  o States she will call on Monday to make her FU appt.  ??? Discuss importance of adherence to treatment plan and follow up visits;  ??? Identify any barriers in transportation or access to FU appointments.   ??? Assist patient with making FU appointments as needed;   ??? It's also a good idea to know your test results.   ??? Keep a list of the medicines you take.  7/20: Patient states she did get an appointment to see her PCP.  Has cardio appt on 8/6.  7/30: Follow up - Cardio 8/6.  Did not see her PCP because her chest pain ended up being muscular.  8/20: Follow completed.  Now seeing a chiropractor for back pain.  9/24: PCP in six weeks.    11/19: She has not made a f/up yet because of covid.  Has an elderly aunt who lives with her and does not want to expose any of her family members.  Encouraged her to continue to follow healthy diet.  12/17: States doing well with diet.  Keeping f/up visits.  1/14: had follow up with her PCP yesterday, going to get labs done on 1/15.  Will make a follow up appointment on 1/19.    3/12: Has f/up with Dr. Aleene Davidson on 4/27.  4/9: F/up with Dr. Aleene Davidson on 4/27.  [place] application to be submitted by 4/25 to be eligible for dexcom on 5/1.      ??? Demonstrates self-management strategies and behaviors for a healthy lifestylenter goal here      ??? Verbalizes importance of a healthy lifestyle and impacts it has on health  o can lower risks for serious health problems such as high blood pressure, heart disease and diabetes;  o can prevent complications or exacerbations of chronic health conditions like Diabetes;  - Has been on decreased carb diet, A1C down to 9.5 from 12.7 in about 6 months  ??? Recognizes  importance of completing follow up appointments with PCP and specialists;  ??? Discussed healthy diet options   o generally: protein source (deck of cards = 3 ounces), five servings of fruits and vegetables (a piece of fruit or cup of vegetables) with every meal;  ??? Sleep 7-8 hours per night;  ??? Verbalizes importance of exercise as part of a daily routine   8/20: She hurt her back at work.  Did not file worker's comp claim because she didn't want to be told she would have to do covid testing to come back to work.  She is seeing a chiropractor regularly and is icing for 12 minutes every three to four.  9/24: Patient described meals she is eating - high protein, low carbs - Breakfast - eggs, cheese and shrimp or salmon, healthy snacks for lunch, dinner ground beef w/ cabbage and potatoes.  Drinking water excessively, average 130 ml/day.  Her provider is aware.  Plans to go to her PCP for A1C check in about 6 weeks.  10/8: States she had salmon and eggs this morning for breakfast. States diet has been going well.  Leaves to go to [place], [place] on 10/16 to visit family.  Encouraged her to plan meals.  11/19: Patient states she just had an eye appointment and eyes are healthy.  No effects of diabetes.  She is still maintaining a healthy diet.  States she is eating egg and shrimp for breakfast.  States her son is becoming engaged in better diet.  12/17: Diet remains on track.  Likes my calls for accountability.  Asked me to keep calling until March.  Provided support and encouragement.    1/14: States she had a spell of eating bad. Had shrimp and eggs for breakfast, dinner melanesia - ([place]), provided support and encouragement.  2/12: Eating salmon and eggs again.  Kids at home, going back to school in September.  Sleeping 6-7 hours a night. Migraine for last two days, now dull headache. Has not taken her Excedrin today, took last two days. Still doing good with water intake.   3/12: Patient states she is doing well.   No issues or concerns.  4/9: Doing well.  Patient to complete Be Well with Diabetes enrollment.  Walked her through how to get to the Be Well platform through [facility] central.            Patient's primary care provider relationship reviewed with patient and modified, as applicable.    Readiness to Change: []   Pre-contemplative    []   Contemplative  []   Preparation               [x]   Action                  []   Maintenance    Barriers/Challenges to Care: []   Decline in memory    []   Language barrier     []   Emotional                  []   Limited mobility  []   Lack of motivation     []  Vision, hearing or cognitive impairment []   Knowledge []  Financial Barriers []   Lack of support  []   Pain []   Other [x]   None    Key pt activities to achieve better health:   [x]   Weight loss  [x]   Improved diabetic control  []   Decreased cholesterol levels  []   Decreased blood pressure  []     []     Upcoming appointments:   Future Appointments   Date Time Provider [place]   08/13/2019 11:45 AM Madelin Rear, MD [facility] AM[facility]     Plan for next call: Call in three weeks.

## 2019-07-26 NOTE — Progress Notes (Signed)
 Follow up phone call to patient, two pt identifiers verified. Discussed patient's goals:   Goals     . Completes all follow-up appointments      . Educate and encourage importance of FU for prevention of complications or disease;  . Assess the patient's relationship with a PCP and next FU visit scheduled;  o States she will call on Monday to make her FU appt.  . Discuss importance of adherence to treatment plan and follow up visits;  . Identify any barriers in transportation or access to FU appointments.   . Assist patient with making FU appointments as needed;   . It's also a good idea to know your test results.   [name] Keep a list of the medicines you take.  7/20: Patient states she did get an appointment to see her PCP.  Has cardio appt on 8/6.  7/30: Follow up - Cardio 8/6.  Did not see her PCP because her chest pain ended up being muscular.  8/20: Follow completed.  Now seeing a chiropractor for back pain.  9/24: PCP in six weeks.    11/19: She has not made a f/up yet because of covid.  Has an elderly aunt who lives with her and does not want to expose any of her family members.  Encouraged her to continue to follow healthy diet.  12/17: States doing well with diet.  Keeping f/up visits.  1/14: had follow up with her PCP yesterday, going to get labs done on 1/15.  Will make a follow up appointment on 1/19.    3/12: Has f/up with Dr. Olita on 4/27.  4/9: F/up with Dr. Olita on 4/27.  HH application to be submitted by 4/25 to be eligible for dexcom on 5/1.      [name] Demonstrates self-management strategies and behaviors for a healthy lifestylenter goal here      . Verbalizes importance of a healthy lifestyle and impacts it has on health  o can lower risks for serious health problems such as high blood pressure, heart disease and diabetes;  o can prevent complications or exacerbations of chronic health conditions like Diabetes;  - Has been on decreased carb diet, A1C down to 9.5 from 12.7 in about 6 months  . Recognizes  importance of completing follow up appointments with PCP and specialists;  [name] Discussed healthy diet options   o generally: protein source (deck of cards = 3 ounces), five servings of fruits and vegetables (a piece of fruit or cup of vegetables) with every meal;  . Sleep 7-8 hours per night;  [name] [place] importance of exercise as part of a daily routine   8/20: She hurt her back at work.  Did not file worker's comp claim because she didn't want to be told she would have to do covid testing to come back to work.  She is seeing a chiropractor regularly and is icing for 12 minutes every three to four.  9/24: Patient described meals she is eating - high protein, low carbs - Breakfast - eggs, cheese and shrimp or salmon, healthy snacks for lunch, dinner ground beef w/ cabbage and potatoes.  Drinking water  excessively, average 130 ml/day.  Her provider is aware.  Plans to go to her PCP for A1C check in about 6 weeks.  10/8: States she had salmon and eggs this morning for breakfast. States diet has been going well.  Leaves to go to [place], [place] on 10/16 to visit family.  Encouraged her to plan meals.  11/19: Patient states she just had an eye appointment and eyes are healthy.  No effects of diabetes.  She is still maintaining a healthy diet.  States she is eating egg and shrimp for breakfast.  States her son is becoming engaged in better diet.  12/17: Diet remains on track.  Likes my calls for accountability.  Asked me to keep calling until March.  Provided support and encouragement.    1/14: States she had a spell of eating bad. Had shrimp and eggs for breakfast, dinner [place] - ([place]), provided support and encouragement.  2/12: Eating salmon and eggs again.  Kids at home, going back to school in September.  Sleeping 6-7 hours a night. Migraine for last two days, now dull headache. Has not taken her Excedrin today, took last two days. Still doing good with water  intake.   3/12: Patient states she is doing well.   No issues or concerns.  4/9: Doing well.  Patient to complete Be Well with Diabetes enrollment.  Walked her through how to get to the Be Well platform through [facility] central.            Patient's primary care provider relationship reviewed with patient and modified, as applicable.    Readiness to Change: []   Pre-contemplative    []   Contemplative  []   Preparation               [x]   Action                  []   Maintenance    Barriers/Challenges to Care: []   Decline in memory    []   Language barrier     []   Emotional                  []   Limited mobility  []   Lack of motivation     []  Vision, hearing or cognitive impairment []   Knowledge []  Financial Barriers []   Lack of support  []   Pain []   Other [x]   None    Key pt activities to achieve better health:   [x]   Weight loss  [x]   Improved diabetic control  []   Decreased cholesterol levels  []   Decreased blood pressure  []     []     Upcoming appointments:   Future Appointments   Date Time Provider Department Center   08/13/2019 11:45 AM Dillon, Caitlin K, MD [facility] AMB     Plan for next call: Call in three weeks.

## 2019-08-13 ENCOUNTER — Encounter: Attending: Family Medicine | Primary: Family Medicine

## 2019-08-13 ENCOUNTER — Ambulatory Visit: Payer: PRIVATE HEALTH INSURANCE | Attending: Family Medicine | Primary: Family Medicine

## 2019-08-14 ENCOUNTER — Telehealth: Attending: Family Medicine | Primary: Family Medicine

## 2019-08-14 ENCOUNTER — Ambulatory Visit: Attending: Family Medicine | Primary: Family Medicine

## 2019-08-14 ENCOUNTER — Telehealth
Admit: 2019-08-14 | Discharge: 2019-08-14 | Payer: PRIVATE HEALTH INSURANCE | Attending: Family Medicine | Primary: Family Medicine

## 2019-08-14 DIAGNOSIS — E118 Type 2 diabetes mellitus with unspecified complications: Secondary | ICD-10-CM

## 2019-08-14 MED ORDER — INSULIN NEEDLES (DISPOSABLE) 31 X 5/16"
31 gauge x 5/16" | PACK | 11 refills | Status: DC
Start: 2019-08-14 — End: 2019-10-09
  Filled 2019-09-10: qty 45, 56d supply, fill #0

## 2019-08-14 MED ORDER — OZEMPIC 1 MG/DOSE (2 MG/1.5 ML) SUBCUTANEOUS PEN INJECTOR
1 mg/dose (2 mg/.5 mL) | SUBCUTANEOUS | 3 refills | Status: DC
Start: 2019-08-14 — End: 2019-10-16
  Filled 2019-08-15: qty 9, 84d supply, fill #0

## 2019-08-14 MED ORDER — FLUTICASONE 50 MCG/ACTUATION NASAL SPRAY, SUSP
50 mcg/actuation | Freq: Every day | NASAL | 1 refills | Status: DC
Start: 2019-08-14 — End: 2019-10-09
  Filled 2019-08-14: qty 48, 90d supply, fill #0

## 2019-08-14 MED FILL — B-D UF PEN NEED 5/16"" 31G (8MM): 90 days supply | Qty: 300 | Fill #0 | Status: AC

## 2019-08-14 NOTE — Progress Notes (Signed)
Alyssa [name] is a 39 y.o. female who was seen by synchronous (real-time) audio-video technology on 08/14/2019.      Consent: Ellyse Faille, who was seen by synchronous (real-time) audio-video technology, and/or her healthcare decision maker, is aware that this patient-initiated, Telehealth encounter on 08/14/2019 is a billable service, with coverage as determined by her insurance carrier. She is aware that she may receive a bill and has provided verbal consent to proceed: Yes.    Assessment & Plan:   1. Type 2 diabetes mellitus with complication, with long-term current use of insulin (HCC)  meds per orders   -uptitrate ozempic  -labs per orders  -ref to pharmacology so we can straighten out the issues with evaluating bg monitoring supplies and optimize bg control (more hands on your back)  - CBC WITH AUTOMATED DIFF; Future  - HEMOGLOBIN A1C WITH EAG; Future  - REFERRAL TO PHARMACIST  - semaglutide (Ozempic) 1 mg/dose (2 mg/1.5 mL) sub-q pen; 1 mg by SubCUTAneous route every seven (7) days.  Dispense: 3 Box; Refill: 3  - Insulin Needles, Disposable, 31 gauge x 5/16" ndle; Use with Lantus Solostar pen  Dispense: 3 Package; Refill: 11    2. Essential hypertension  Unclear control  Would benefit from in office visit for this  Will reassess2  - CBC WITH AUTOMATED DIFF; Future  - semaglutide (Ozempic) 1 mg/dose (2 mg/1.5 mL) sub-q pen; 1 mg by SubCUTAneous route every seven (7) days.  Dispense: 3 Box; Refill: 3    3. Thrombocytosis ([place])  Due for lab follow up  - CBC WITH AUTOMATED DIFF; Future    4. Environmental and seasonal allergies  Trial flonase in addition to otc claritin/zyrtec to help with symptoms  - fluticasone propionate (FLONASE) 50 mcg/actuation nasal spray; 2 Sprays by Both Nostrils route daily.  Dispense: 3 Bottle; Refill: 1    5. Post-nasal drip  As above  - fluticasone propionate (FLONASE) 50 mcg/actuation nasal spray; 2 Sprays by Both Nostrils route daily.  Dispense: 3 Bottle; Refill: 1    6. Obesity,  morbid ([place])  ozempic titration  - semaglutide (Ozempic) 1 mg/dose (2 mg/1.5 mL) sub-q pen; 1 mg by SubCUTAneous route every seven (7) days.  Dispense: 3 Box; Refill: 3          Pt was counseled on risks, benefits and alternatives of treatment options. All questions were asked and answered and the patient was agreeable with the treatment plan as outlined.      Subjective:   Alyssa [name] is a 39 y.o. female who was seen for Diabetes (Follow up)       DM: haven't been eating the right things  Not consistently checking her sugars  Has med mutual/medicaid  Has been on ozempic at 0.25, will go up to 0.5 then up to 1  She is taking her insulin 80 U every night  No signs of lows  She's got a fingerstick recently and she hasn't checked, the libre and dexcom it turns out she never got    Env and seasonal allergies  -has tried cetirizine, benadryl and sudafed  She has thick mucous at the back of her throat, hard to swallow down, worse when laying back  Medications, allergies, PMH, PSH, SOCH, [facility] reviewed and updated per routine protocol, see chart for review and changes if not noted here.    HTN: patient reports stable on medications. No chest pain, sob, headache, blurred vision, leg swelling, diaphoresis, falls. Compliant with medications as prescribed. Is intermittently  checking blood pressures at home and reports range is unk/unk. No significant hyper or hypotensive episodes that the patient reports today.  Has her bp cuff in storage unit but last time she checked it it was "pretty good"    ROS  A 12 point review of systems was negative except as noted here or in the HPI.    Objective:   Vital Signs: (As obtained by patient/caregiver at home)  Patient-Reported Vitals 08/14/2019   Patient-Reported Weight 269lb   Patient-Reported Height -   Patient-Reported Pulse -   Patient-Reported Temperature -   Patient-Reported SpO2 -   Patient-Reported Systolic  -   Patient-Reported Diastolic -   Patient-Reported Peak Flow -    Patient-Reported LMP -        [INSTRUCTIONS:  "[x] " Indicates a positive item  "[] " Indicates a negative item  -- DELETE ALL ITEMS NOT EXAMINED]    Constitutional: [x]  Appears well-developed and well-nourished [x]  No apparent distress      []  Abnormal -     Mental status: [x]  Alert and awake  [x]  Oriented to person/place/time [x]  Able to follow commands    []  Abnormal -     Eyes:   EOM    [x]   Normal    []  Abnormal -   Sclera  [x]   Normal    []  Abnormal -          Discharge [x]   None visible   []  Abnormal -     HENT: [x]  Normocephalic, atraumatic  []  Abnormal -   [x]  Mouth/Throat: Mucous membranes are moist    External Ears [x]  Normal  []  Abnormal -    Neck: [x]  No visualized mass []  Abnormal -     Pulmonary/Chest: [x]  Respiratory effort normal   [x]  No visualized signs of difficulty breathing or respiratory distress        []  Abnormal -      Musculoskeletal:   []  Normal gait with no signs of ataxia         [x]  Normal range of motion of neck        []  Abnormal -     Neurological:        [x]  No Facial Asymmetry (Cranial nerve 7 motor function) (limited exam due to video visit)          [x]  No gaze palsy        []  Abnormal -          Skin:        [x]  No significant exanthematous lesions or discoloration noted on facial skin         []  Abnormal -            Psychiatric:       []  Normal Affect [x]  Abnormal - mood is flat       [x]  No Hallucinations    Other pertinent observable physical exam findings:no distress, obese, non toxic    We discussed the expected course, resolution and complications of the diagnosis(es) in detail.  Medication risks, benefits, costs, interactions, and alternatives were discussed as indicated.  I advised her to contact the office if her condition worsens, changes or fails to improve as anticipated. She expressed understanding with the diagnosis(es) and plan.       Alyssa [name] is a 39 y.o. female who was evaluated by a video visit encounter for concerns as above. Patient identification was  verified prior to start of the visit. A caregiver was present when  appropriate. Due to this being a Scientist, physiological (During [id number] public health emergency), evaluation of the following organ systems was limited: Vitals/Constitutional/EENT/Resp/CV/GI/GU/MS/Neuro/Skin/Heme-Lymph-Imm.  Pursuant to the emergency declaration under the [place], 1135 waiver authority and the [employer] Act, this Virtual  Visit was conducted, with patient's (and/or legal guardian's) consent, to reduce the patient's risk of exposure to COVID-19 and provide necessary medical care.     Services were provided through a video synchronous discussion virtually to substitute for in-person clinic visit.   Patient and provider were located at their individual homes.      Doneen Poisson, MD  [facility]  08/14/19 11:40 AM     Portions of this note may have been populated using smart dictation software and may have "sounds-like" errors present.

## 2019-08-14 NOTE — Progress Notes (Signed)
Chief Complaint   Patient presents with   ??? Diabetes     Follow up     1. Have you been to the ER, urgent care clinic since your last visit?  Hospitalized since your last visit? No    2. Have you seen or consulted any other health care providers outside of the [facility] since your last visit?  Include any pap smears or colon screening. No

## 2019-08-14 NOTE — Progress Notes (Signed)
Carroll Isabella is a 39 y.o. female who was seen by synchronous (real-time) audio-video technology on 08/14/2019.      Consent: Josephina Hattar, who was seen by synchronous (real-time) audio-video technology, and/or her healthcare decision maker, is aware that this patient-initiated, Telehealth encounter on 08/14/2019 is a billable service, with coverage as determined by her insurance carrier. She is aware that she may receive a bill and has provided verbal consent to proceed: Yes.    Assessment & Plan:   1. Type 2 diabetes mellitus with complication, with long-term current use of insulin (HCC)  meds per orders   -uptitrate ozempic  -labs per orders  -ref to pharmacology so we can straighten out the issues with evaluating bg monitoring supplies and optimize bg control (more hands on your back)  - CBC WITH AUTOMATED DIFF; Future  - HEMOGLOBIN A1C WITH EAG; Future  - REFERRAL TO PHARMACIST  - semaglutide (Ozempic) 1 mg/dose (2 mg/1.5 mL) sub-q pen; 1 mg by SubCUTAneous route every seven (7) days.  Dispense: 3 Box; Refill: 3  - Insulin Needles, Disposable, 31 gauge x 5/16" ndle; Use with Lantus Solostar pen  Dispense: 3 Package; Refill: 11    2. Essential hypertension  Unclear control  Would benefit from in office visit for this  Will reassess2  - CBC WITH AUTOMATED DIFF; Future  - semaglutide (Ozempic) 1 mg/dose (2 mg/1.5 mL) sub-q pen; 1 mg by SubCUTAneous route every seven (7) days.  Dispense: 3 Box; Refill: 3    3. Thrombocytosis ([place])  Due for lab follow up  - CBC WITH AUTOMATED DIFF; Future    4. Environmental and seasonal allergies  Trial flonase in addition to otc claritin/zyrtec to help with symptoms  - fluticasone propionate (FLONASE) 50 mcg/actuation nasal spray; 2 Sprays by Both Nostrils route daily.  Dispense: 3 Bottle; Refill: 1    5. Post-nasal drip  As above  - fluticasone propionate (FLONASE) 50 mcg/actuation nasal spray; 2 Sprays by Both Nostrils route daily.  Dispense: 3 Bottle; Refill: 1    6. Obesity,  morbid ([place])  ozempic titration  - semaglutide (Ozempic) 1 mg/dose (2 mg/1.5 mL) sub-q pen; 1 mg by SubCUTAneous route every seven (7) days.  Dispense: 3 Box; Refill: 3          Pt was counseled on risks, benefits and alternatives of treatment options. All questions were asked and answered and the patient was agreeable with the treatment plan as outlined.      Subjective:   Catie Echard is a 39 y.o. female who was seen for Diabetes (Follow up)       DM: haven't been eating the right things  Not consistently checking her sugars  Has med mutual/medicaid  Has been on ozempic at 0.25, will go up to 0.5 then up to 1  She is taking her insulin 80 U every night  No signs of lows  She's got a fingerstick recently and she hasn't checked, the libre and dexcom it turns out she never got    Env and seasonal allergies  -has tried cetirizine, benadryl and sudafed  She has thick mucous at the back of her throat, hard to swallow down, worse when laying back  Medications, allergies, PMH, PSH, SOCH, [facility] reviewed and updated per routine protocol, see chart for review and changes if not noted here.    HTN: patient reports stable on medications. No chest pain, sob, headache, blurred vision, leg swelling, diaphoresis, falls. Compliant with medications as prescribed. Is intermittently  checking blood pressures at home and reports range is unk/unk. No significant hyper or hypotensive episodes that the patient reports today.  Has her bp cuff in storage unit but last time she checked it it was "pretty good"    ROS  A 12 point review of systems was negative except as noted here or in the HPI.    Objective:   Vital Signs: (As obtained by patient/caregiver at home)  Patient-Reported Vitals 08/14/2019   Patient-Reported Weight 269lb   Patient-Reported Height -   Patient-Reported Pulse -   Patient-Reported Temperature -   Patient-Reported SpO2 -   Patient-Reported Systolic  -   Patient-Reported Diastolic -   Patient-Reported Peak Flow -    Patient-Reported LMP -        [INSTRUCTIONS:  "[x] " Indicates a positive item  "[] " Indicates a negative item  -- DELETE ALL ITEMS NOT EXAMINED]    Constitutional: [x]  Appears well-developed and well-nourished [x]  No apparent distress      []  Abnormal -     Mental status: [x]  Alert and awake  [x]  Oriented to person/place/time [x]  Able to follow commands    []  Abnormal -     Eyes:   EOM    [x]   Normal    []  Abnormal -   Sclera  [x]   Normal    []  Abnormal -          Discharge [x]   None visible   []  Abnormal -     HENT: [x]  Normocephalic, atraumatic  []  Abnormal -   [x]  Mouth/Throat: Mucous membranes are moist    External Ears [x]  Normal  []  Abnormal -    Neck: [x]  No visualized mass []  Abnormal -     Pulmonary/Chest: [x]  Respiratory effort normal   [x]  No visualized signs of difficulty breathing or respiratory distress        []  Abnormal -      Musculoskeletal:   []  Normal gait with no signs of ataxia         [x]  Normal range of motion of neck        []  Abnormal -     Neurological:        [x]  No Facial Asymmetry (Cranial nerve 7 motor function) (limited exam due to video visit)          [x]  No gaze palsy        []  Abnormal -          Skin:        [x]  No significant exanthematous lesions or discoloration noted on facial skin         []  Abnormal -            Psychiatric:       []  Normal Affect [x]  Abnormal - mood is flat       [x]  No Hallucinations    Other pertinent observable physical exam findings:no distress, obese, non toxic    We discussed the expected course, resolution and complications of the diagnosis(es) in detail.  Medication risks, benefits, costs, interactions, and alternatives were discussed as indicated.  I advised her to contact the office if her condition worsens, changes or fails to improve as anticipated. She expressed understanding with the diagnosis(es) and plan.       Aliceia Lamonica is a 39 y.o. female who was evaluated by a video visit encounter for concerns as above. Patient identification was  verified prior to start of the visit. A caregiver was present when  appropriate. Due to this being a Scientist, physiological (During [id number] public health emergency), evaluation of the following organ systems was limited: Vitals/Constitutional/EENT/Resp/CV/GI/GU/MS/Neuro/Skin/Heme-Lymph-Imm.  Pursuant to the emergency declaration under the [place], 1135 waiver authority and the [employer] Act, this Virtual  Visit was conducted, with patient's (and/or legal guardian's) consent, to reduce the patient's risk of exposure to COVID-19 and provide necessary medical care.     Services were provided through a video synchronous discussion virtually to substitute for in-person clinic visit.   Patient and provider were located at their individual homes.      Doneen Poisson, MD  [facility]  08/14/19 11:40 AM     Portions of this note may have been populated using smart dictation software and may have "sounds-like" errors present.

## 2019-08-14 NOTE — Progress Notes (Signed)
Chief Complaint   Patient presents with    Diabetes     Follow up.     1. Have you been to the ER, urgent care clinic since your last visit?  Hospitalized since your last visit?No    2. Have you seen or consulted any other health care providers outside of the [facility] since your last visit?  Include any pap smears or colon screening. No

## 2019-08-21 NOTE — Telephone Encounter (Signed)
Patient referred by PCP for diabetes management. Called patient (ID verified x2) and set up an appointment. Patient to be seen virtually on 08/28/2019     Gaynelle Cage, PharmD, BCPS, [place] Only    ??? CPA in place: YES   ??? Recommendation Provided To: Patient/Caregiver: 1 via Telephone  ??? Intervention Detail: Scheduled Appointment  ??? Total # of Interventions Recommended: 1  ??? Total # of Interventions Accepted: 1  ??? Intervention Accepted By: Patient/Caregiver: 1  ??? Time Spent (min): 5

## 2019-08-23 NOTE — Progress Notes (Signed)
Follow up phone call to patient, two pt identifiers verified. Discussed patient's goals:   Goals     ??? Completes all follow-up appointments      ??? Educate and encourage importance of FU for prevention of complications or disease;  ??? Assess the patient's relationship with a PCP and next FU visit scheduled;  o States she will call on Monday to make her FU appt.  ??? Discuss importance of adherence to treatment plan and follow up visits;  ??? Identify any barriers in transportation or access to FU appointments.   ??? Assist patient with making FU appointments as needed;   ??? It's also a good idea to know your test results.   ??? Keep a list of the medicines you take.  7/20: Patient states she did get an appointment to see her PCP.  Has cardio appt on 8/6.  7/30: Follow up - Cardio 8/6.  Did not see her PCP because her chest pain ended up being muscular.  8/20: Follow completed.  Now seeing a chiropractor for back pain.  9/24: PCP in six weeks.    11/19: She has not made a f/up yet because of covid.  Has an elderly aunt who lives with her and does not want to expose any of her family members.  Encouraged her to continue to follow healthy diet.  12/17: States doing well with diet.  Keeping f/up visits.  1/14: had follow up with her PCP yesterday, going to get labs done on 1/15.  Will make a follow up appointment on 1/19.    3/12: Has f/up with Dr. Aleene Davidson on 4/27.  4/9: F/up with Dr. Aleene Davidson on 4/27.  [place] application to be submitted by 4/25 to be eligible for dexcom on 5/1.  5/7: Had apt with PCP, 4/28.  Pharmacy has been in contact with patient to work on diabetes program, scheduled for 5/12.      ??? Demonstrates self-management strategies and behaviors for a healthy lifestylenter goal here      ??? Verbalizes importance of a healthy lifestyle and impacts it has on health  o can lower risks for serious health problems such as high blood pressure, heart disease and diabetes;  o can prevent complications or exacerbations of chronic  health conditions like Diabetes;  - Has been on decreased carb diet, A1C down to 9.5 from 12.7 in about 6 months  ??? Recognizes importance of completing follow up appointments with PCP and specialists;  ??? Discussed healthy diet options   o generally: protein source (deck of cards = 3 ounces), five servings of fruits and vegetables (a piece of fruit or cup of vegetables) with every meal;  ??? Sleep 7-8 hours per night;  ??? Verbalizes importance of exercise as part of a daily routine   8/20: She hurt her back at work.  Did not file worker's comp claim because she didn't want to be told she would have to do covid testing to come back to work.  She is seeing a chiropractor regularly and is icing for 12 minutes every three to four.  9/24: Patient described meals she is eating - high protein, low carbs - Breakfast - eggs, cheese and shrimp or salmon, healthy snacks for lunch, dinner ground beef w/ cabbage and potatoes.  Drinking water excessively, average 130 ml/day.  Her provider is aware.  Plans to go to her PCP for A1C check in about 6 weeks.  10/8: States she had salmon and eggs this morning for breakfast. States  diet has been going well.  Leaves to go to [place], [place] on 10/16 to visit family.  Encouraged her to plan meals.    11/19: Patient states she just had an eye appointment and eyes are healthy.  No effects of diabetes.  She is still maintaining a healthy diet.  States she is eating egg and shrimp for breakfast.  States her son is becoming engaged in better diet.  12/17: Diet remains on track.  Likes my calls for accountability.  Asked me to keep calling until March.  Provided support and encouragement.    1/14: States she had a spell of eating bad. Had shrimp and eggs for breakfast, dinner melanesia - ([place]), provided support and encouragement.  2/12: Eating salmon and eggs again.  Kids at home, going back to school in September.  Sleeping 6-7 hours a night. Migraine for last two days, now dull headache. Has  not taken her Excedrin today, took last two days. Still doing good with water intake.   3/12: Patient states she is doing well.  No issues or concerns.  4/9: Doing well.  Patient to complete Be Well with Diabetes enrollment.  Walked her through how to get to the Be Well platform through [facility] central.  5/7: Will get labs drawn on Monday, 5/10 for A1C.              Patient's primary care provider relationship reviewed with patient and modified, as applicable.    Readiness to Change: []   Pre-contemplative    []   Contemplative  []   Preparation               [x]   Action                  []   Maintenance    Barriers/Challenges to Care: []   Decline in memory    []   Language barrier     []   Emotional                  []   Limited mobility  []   Lack of motivation     []  Vision, hearing or cognitive impairment []   Knowledge []  Financial Barriers []   Lack of support  []   Pain []   Other []   None    Key pt activities to achieve better health:   [x]   Weight loss  [x]   Improved diabetic control  []   Decreased cholesterol levels  []   Decreased blood pressure  []     []     Upcoming appointments:   Future Appointments   Date Time Provider [place]   08/28/2019  9:00 AM Buffington, Pleas Patricia, PHARMD [facility] BS AMB     Plan for next call:  Will call in one week to check on A1C.  5/14

## 2019-08-23 NOTE — Progress Notes (Signed)
Follow up phone call to patient, two pt identifiers verified. Discussed patient's goals:   Goals     . Completes all follow-up appointments      . Educate and encourage importance of FU for prevention of complications or disease;  . Assess the patient's relationship with a PCP and next FU visit scheduled;  o States she will call on Monday to make her FU appt.  . Discuss importance of adherence to treatment plan and follow up visits;  . Identify any barriers in transportation or access to FU appointments.   . Assist patient with making FU appointments as needed;   . It's also a good idea to know your test results.   Marland Kitchen Keep a list of the medicines you take.  7/20: Patient states she did get an appointment to see her PCP.  Has cardio appt on 8/6.  7/30: Follow up - Cardio 8/6.  Did not see her PCP because her chest pain ended up being muscular.  8/20: Follow completed.  Now seeing a chiropractor for back pain.  9/24: PCP in six weeks.    11/19: She has not made a f/up yet because of covid.  Has an elderly aunt who lives with her and does not want to expose any of her family members.  Encouraged her to continue to follow healthy diet.  12/17: States doing well with diet.  Keeping f/up visits.  1/14: had follow up with her PCP yesterday, going to get labs done on 1/15.  Will make a follow up appointment on 1/19.    3/12: Has f/up with Dr. Madelin Rear on 4/27.  4/9: F/up with Dr. Madelin Rear on 4/27.  [facility] application to be submitted by 4/25 to be eligible for dexcom on 5/1.  5/7: Had apt with PCP, 4/28.  Pharmacy has been in contact with patient to work on diabetes program, scheduled for 5/12.      . Demonstrates self-management strategies and behaviors for a healthy lifestylenter goal here      . Verbalizes importance of a healthy lifestyle and impacts it has on health  o can lower risks for serious health problems such as high blood pressure, heart disease and diabetes;  o can prevent complications or exacerbations of chronic  health conditions like Diabetes;  - Has been on decreased carb diet, A1C down to 9.5 from 12.7 in about 6 months  . Recognizes importance of completing follow up appointments with PCP and specialists;  Marland Kitchen Discussed healthy diet options   o generally: protein source (deck of cards = 3 ounces), five servings of fruits and vegetables (a piece of fruit or cup of vegetables) with every meal;  . Sleep 7-8 hours per night;  Trenton Gammon importance of exercise as part of a daily routine   8/20: She hurt her back at work.  Did not file worker's comp claim because she didn't want to be told she would have to do covid testing to come back to work.  She is seeing a chiropractor regularly and is icing for 12 minutes every three to four.  9/24: Patient described meals she is eating - high protein, low carbs - Breakfast - eggs, cheese and shrimp or salmon, healthy snacks for lunch, dinner ground beef w/ cabbage and potatoes.  Drinking water excessively, average 130 ml/day.  Her provider is aware.  Plans to go to her PCP for A1C check in about 6 weeks.  10/8: States she had salmon and eggs this morning for breakfast. States  diet has been going well.  Leaves to go to [place], [place] on 10/16 to visit family.  Encouraged her to plan meals.    11/19: Patient states she just had an eye appointment and eyes are healthy.  No effects of diabetes.  She is still maintaining a healthy diet.  States she is eating egg and shrimp for breakfast.  States her son is becoming engaged in better diet.  12/17: Diet remains on track.  Likes my calls for accountability.  Asked me to keep calling until March.  Provided support and encouragement.    1/14: States she had a spell of eating bad. Had shrimp and eggs for breakfast, dinner melanesia - ([place]), provided support and encouragement.  2/12: Eating salmon and eggs again.  Kids at home, going back to school in September.  Sleeping 6-7 hours a night. Migraine for last two days, now dull headache. Has  not taken her Excedrin today, took last two days. Still doing good with water intake.   3/12: Patient states she is doing well.  No issues or concerns.  4/9: Doing well.  Patient to complete Be Well with Diabetes enrollment.  Walked her through how to get to the Be Well platform through [facility] central.  5/7: Will get labs drawn on Monday, 5/10 for A1C.              Patient's primary care provider relationship reviewed with patient and modified, as applicable.    Readiness to Change: []   Pre-contemplative    []   Contemplative  []   Preparation               [x]   Action                  []   Maintenance    Barriers/Challenges to Care: []   Decline in memory    []   Language barrier     []   Emotional                  []   Limited mobility  []   Lack of motivation     []  Vision, hearing or cognitive impairment []   Knowledge []  Financial Barriers []   Lack of support  []   Pain []   Other []   None    Key pt activities to achieve better health:   [x]   Weight loss  [x]   Improved diabetic control  []   Decreased cholesterol levels  []   Decreased blood pressure  []     []     Upcoming appointments:   Future Appointments   Date Time Provider Department Center   08/28/2019  9:00 AM Buffington, Mliss Sax, PHARMD [facility] BS AMB     Plan for next call:  Will call in one week to check on A1C.  5/14

## 2019-08-28 ENCOUNTER — Telehealth: Primary: Family Medicine

## 2019-08-28 ENCOUNTER — Telehealth: Admit: 2019-08-28 | Payer: MEDICAID | Primary: Family Medicine

## 2019-08-28 DIAGNOSIS — E1140 Type 2 diabetes mellitus with diabetic neuropathy, unspecified: Secondary | ICD-10-CM

## 2019-08-28 NOTE — Progress Notes (Signed)
Diabetes management/education    S/O: Alyssa [name] is a 39 y.o. female referred by Dr. Aleene Davidson, Colvin Caroli, MD for diabetes management. Patient is seen through a virtual visit today utilizing the [email or web address] platform.        HPI: Patient states that she doesn't feel like her sugars have been out of control but that Dr. Aleene Davidson wanted her to meet with me so that she could get her dexcom order figured out. She doesn't have a preference between freestyle libre/dexcom but was told her insurance preferred dexcom. Patient has been dealing with this issue of getting her CGM covered for >6 months.     Insurance:  [employer] - [employer] insurance  [employer] - Medicaid    Current Diabetes Regimen:  -Ozempic 1mg  once weekly  -Lantus 80 units once daily  -Metformin 500mg  2 tabs once daily  Denies any missed doses    ROS:   Patient denies hypoglycemic and hyperglycemic signs/symptoms, chest pain, shortness of breath, neuropathy, vision changes, and any foot changes.    Self-Monitoring Blood Glucose/Continue Glucose Monitoring:  -hasn't been checking blood sugars  -waiting to get CGM covered again - does not like checking     Diet/Exercise:  Not addressed today    Data reviewed:     Lab Results   Component Value Date/Time    Hemoglobin A1c 10.2 (H) 05/13/2019 08:26 AM    Hemoglobin A1c 9.5 (H) 09/14/2018 02:42 PM    Hemoglobin A1c 12.7 (H) 06/07/2018 03:58 PM         Diabetes Health Maintenance:  Last eye exam: 02/08/18  Last foot exam: 03/04/16  Last influenza vaccine: 01/04/2019  Last Pneumovax 23 vaccine: 03/24/2011  Last Prevnar-13 vaccine: not indicated  Hepatitis B Series: unknown  ASA Therapy:none  ACE/ARB Therapy: lisinopril 10mg   Statin Therapy: rosuvastatin 10mg     Assessment/Plan:     1.  Diabetes:  a1c not at goal <7% per ADA guidelines and appears to be out of goal ranges for the last year. Unclear where blood sugars are running as patient is not checking her blood sugars. Will reach out to employee diabetes  program to see if someone can follow-up with her about getting involved with this. This may give her a certain amount of money that can be used for CGM coverage, even for just a few months, to help [place] figure out a plan to get her sugars better controlled. Will also reach out to DME company about medicaid coverage since patients a1c >10%. Patient due to have her a1c rechcked soon. Will follow up with patient once I have more information on CGM coverage.        Patient verbalized understanding of the information presented and all of the patient???s questions were answered.  Advised information from visit would be available in [place] for review if needed.     Patient advised to call me or Dr. Aleene Davidson, Colvin Caroli, MD with any additional questions or concerns.    Follow-up:.with more CGM information    Notification of recommendations will be sent to Dr. Aleene Davidson, Colvin Caroli, MD for review.      Gaynelle Cage, PharmD, BCPS, [place] Only    ??? CPA in place: NO   ??? Recommendation Provided To: Patient/Caregiver: 0 via Virtual Visit  ??? Time Spent (min): 30

## 2019-08-28 NOTE — Progress Notes (Signed)
Diabetes management/education    S/O: Alyssa [name] is a 39 y.o. female referred by Dr. Madelin Rear, Alyssa Sams, MD for diabetes management. Patient is seen through a virtual visit today utilizing the doxy.me platform.        HPI: Patient states that she doesn't feel like her sugars have been out of control but that Dr. Madelin Rear wanted her to meet with me so that she could get her dexcom order figured out. She doesn't have a preference between freestyle libre/dexcom but was told her insurance preferred dexcom. Patient has been dealing with this issue of getting her CGM covered for >6 months.     Insurance:  [employer] insurance  [employer] - Medicaid    Current Diabetes Regimen:  -Ozempic 1mg  once weekly  -Lantus 80 units once daily  -Metformin 500mg  2 tabs once daily  Denies any missed doses    ROS:   Patient denies hypoglycemic and hyperglycemic signs/symptoms, chest pain, shortness of breath, neuropathy, vision changes, and any foot changes.    Self-Monitoring Blood Glucose/Continue Glucose Monitoring:  -hasn't been checking blood sugars  -waiting to get CGM covered again - does not like checking     Diet/Exercise:  Not addressed today    Data reviewed:     Lab Results   Component Value Date/Time    Hemoglobin A1c 10.2 (H) 05/13/2019 08:26 AM    Hemoglobin A1c 9.5 (H) 09/14/2018 02:42 PM    Hemoglobin A1c 12.7 (H) 06/07/2018 03:58 PM         Diabetes Health Maintenance:  Last eye exam: 02/08/18  Last foot exam: 03/04/16  Last influenza vaccine: 01/04/2019  Last Pneumovax 23 vaccine: 03/24/2011  Last Prevnar-13 vaccine: not indicated  Hepatitis B Series: unknown  ASA Therapy:none  ACE/ARB Therapy: lisinopril 10mg   Statin Therapy: rosuvastatin 10mg     Assessment/Plan:     1.  Diabetes:  a1c not at goal <7% per ADA guidelines and appears to be out of goal ranges for the last year. Unclear where blood sugars are running as patient is not checking her blood sugars. Will reach out to employee diabetes  program to see if someone can follow-up with her about getting involved with this. This may give her a certain amount of money that can be used for CGM coverage, even for just a few months, to help [place] figure out a plan to get her sugars better controlled. Will also reach out to DME company about medicaid coverage since patients a1c >10%. Patient due to have her a1c rechcked soon. Will follow up with patient once I have more information on CGM coverage.        Patient verbalized understanding of the information presented and all of the patient's questions were answered.  Advised information from visit would be available in [place] for review if needed.     Patient advised to call me or Dr. Madelin Rear, Alyssa Sams, MD with any additional questions or concerns.    Follow-up:.with more CGM information    Notification of recommendations will be sent to Dr. Madelin Rear, Alyssa Sams, MD for review.      Alyssa [name], PharmD, BCPS, CDCES    For Pharmacy Admin Tracking Only    . CPA in place: NO   . Recommendation Provided To: Patient/Caregiver: 0 via Virtual Visit  . Time Spent (min): 30

## 2019-09-02 NOTE — Telephone Encounter (Signed)
Attempted to reach out to patient about options for CGM.    Patient has [facility] insurance primary, and [place] medicaid secondary.  She will not be able to get CGM covered by [facility]  She is currently in process of signing up for our employee diabetes program. We are going to discuss the different CGM options and what might be best. These include Dexcom G6 or Freestyle libre.    I can also complete her yearly RPh visit since she will be enrolled 6/1 if she sends the app back in.    Will attempt one more time to reach patient.    Vertell Novak, PharmD, [place]  Phone: [phone] option-7

## 2019-09-02 NOTE — Telephone Encounter (Signed)
Per 07/23/19 Encounter:  Pharmacy Pop Care Documentation:   Patient is missing??the following requirements: DM Program Application.  If completed??by 08/11/19 patient will be eligible for enrollment in the DM Program on 08/17/19.  ??  Alonza Smoker, [place] Support Specialist  Department, toll free: [phone], option 7

## 2019-09-02 NOTE — Telephone Encounter (Signed)
Received the following e-mail:  Good Afternoon,  I am reaching out for clarification or help with getting a patient set up with a CGM device. She shared with me trying to get coverage for this has been going on for >6 months. Her PCP is at an office I???m just getting involved with helping. 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 I don???t think she???s going to meet the requirements to get it covered through anthem because she isn???t on multiple injections/day. However her a1c has been significantly uncontrolled for a long time and I feel like this is our biggest chance at figuring out her meds and making a difference in her diabetes management.     MRN [id number]    If nothing else, I was hopeful that she could get enrolled in the diabetes program and at least utilize some of that incentive money to have CGM coverage for a couple months (likely freestyle libre since lower cost).     Any thoughts or help with this would be much appreciated.    Thank you,  Benjaman Lobe, PharmD, BCPS, [place] Pharmacist Specialist and Diabetes Educator  Industry / [facility]    [address] [place], [address] / [place] [address]  Office: [phone] / Mobile: [phone] (preferred) / Fax: [phone]

## 2019-09-02 NOTE — Telephone Encounter (Signed)
Patient still needs to complete the DM Program Application by [id number] for enrollment into the program on 09/17/19.    Patient was advised of the above information and the pharmacist requested that we outreach to patient as soon as possible to discuss CGM Options to help control her high blood sugars.      Alonza Smoker, [place] Support Specialist  Department, toll free: [phone], option 7

## 2019-09-02 NOTE — Telephone Encounter (Signed)
Called patient to inform her about the diabetes program. Asked if she had received application before or if she had it and she said no. Advised that I would have the team reach out with the application and have her sign up. Advised I needed her to do this ASAP since this month could significantly impact how much money she gets. Will forward to team to get things started.    Gaynelle Cage, PharmD, BCPS, [place] Only    ??? CPA in place: No  ??? Recommendation Provided To: Patient/Caregiver: 0 via Telephone  ??? Time Spent (min): 5

## 2019-09-04 NOTE — Telephone Encounter (Signed)
Spoke with patient today.  She is interested in [place].  She has 2 insurance plans: Med mutual thru [facility] and [place].  Based on PA criteria, she will not qualify for coverage through medicaid, but may be able to qualify through Med [employer].  If covered, copay would be $130 for 3 months, but would be reduced to 0$ if she joins the DM program. Patient has to complete application before [id number] to be enrolled 6/1.    Plan:  -  I have sent her a copy of enrollment form via email and will wait for her to fill out and return. Once she does this and copay waiver is in place, I will work to obtain a prescription and a PA from med mutual.  If approved she will be able to get free of charge.    Vertell Novak, PharmD, [place]  Phone: [phone] option-7

## 2019-09-09 NOTE — Telephone Encounter (Signed)
Received DM Program Application via fax.    Pharmacy Pop Care Documentation:   Patient has completed all the requirements by 09/10/19 and therefore will be enrolled in the DM Program on 09/17/19.    Application received: [id number]  Application scanned.    Letter mailed to patient.    Alonza Smoker, [place] Support Specialist  Department, toll free: [phone], option 7

## 2019-09-09 NOTE — Telephone Encounter (Signed)
Received DM Program Application via fax.    Pharmacy Pop Care Documentation:   Patient has completed all the requirements by 09/10/19 and therefore will be enrolled in the DM Program on 09/17/19.    Application received: [id number]  Application scanned.    Letter mailed to patient.    Alonza Smoker, [place] Support Specialist  Department, toll free: [phone], option 7

## 2019-09-10 ENCOUNTER — Encounter

## 2019-09-10 MED ORDER — ROSUVASTATIN 10 MG TAB
10 mg | ORAL_TABLET | ORAL | 0 refills | Status: DC
Start: 2019-09-10 — End: 2019-12-04
  Filled 2019-09-11: qty 90, 90d supply, fill #0

## 2019-09-10 MED ORDER — LISINOPRIL 10 MG TAB
10 mg | ORAL_TABLET | ORAL | 0 refills | Status: DC
Start: 2019-09-10 — End: 2019-12-04
  Filled 2019-09-11: qty 90, 90d supply, fill #0

## 2019-09-10 MED FILL — METFORMIN HCL ER 500MG TB24: 500 mg | 90 days supply | Qty: 180 | Fill #0 | Status: AC

## 2019-09-10 MED FILL — B-D UF PEN NEED 5/16"" 31G (8MM): 90 days supply | Qty: 100 | Fill #1 | Status: AC

## 2019-09-17 NOTE — Progress Notes (Signed)
The application for Alyssa [name] for enrollment into the diabetes management program has been reviewed and accepted on 09/17/19.    Alyssa [name]

## 2019-09-17 NOTE — Progress Notes (Signed)
The application for Alyssa [name] for enrollment into the diabetes management program has been reviewed and accepted on 09/17/19.    Lynett Fish

## 2019-09-18 NOTE — Telephone Encounter (Signed)
Called patient to schedule 2021 yearly pharmacist appointment to discuss medications for Diabetes Management Program.    Spoke to patient and appointment scheduled for 09/25/19 at 1:30 PM.    Alonza Smoker, [place] Support Specialist  Department, toll free: [phone], option 7

## 2019-09-18 NOTE — Telephone Encounter (Signed)
Opened In error

## 2019-09-18 NOTE — Telephone Encounter (Signed)
Called patient to schedule 2021 yearly pharmacist appointment to discuss medications for Diabetes Management Program.    Spoke to patient and appointment scheduled for 09/25/19 at 1:30 PM.    Christen Butter, CphT  Sondra Barges [facility]  Clinical Pharmacy Support Specialist  Department, toll free: [phone], option 7

## 2019-09-25 ENCOUNTER — Telehealth: Primary: Family Medicine

## 2019-09-25 NOTE — Telephone Encounter (Signed)
POPULATION HEALTH CLINICAL PHARMACY REVIEW - BE WELL WITH DIABETES  =================================================================  Alyssa [name] is a 39 y.o. female enrolled in the [facility] Employee Diabetes Program.    Two attempts made to reach patient by telephone for pharmacist appointment. Left voice message for patient to return clinician's phone call to direct and department line. Will prepare MyChart message and send to patient.    Vicki Mallet, PharmD, BCPS  Population Health Pharmacy  [facility] Clinical Pharmacist  Direct: [phone]  Department: [phone], option 7  =====================================  For Pharmacy Admin Tracking Only    . Gap Closed?: No  . Time Spent (min): 10

## 2019-09-26 NOTE — Progress Notes (Signed)
Follow up phone call to patient, two pt identifiers verified. Discussed patient's goals:   Goals     . Completes all follow-up appointments      . Educate and encourage importance of FU for prevention of complications or disease;  . Assess the patient's relationship with a PCP and next FU visit scheduled;  o States she will call on Monday to make her FU appt.  . Discuss importance of adherence to treatment plan and follow up visits;  . Identify any barriers in transportation or access to FU appointments.   . Assist patient with making FU appointments as needed;   . It's also a good idea to know your test results.   Marland Kitchen Keep a list of the medicines you take.  7/20: Patient states she did get an appointment to see her PCP.  Has cardio appt on 8/6.  7/30: Follow up - Cardio 8/6.  Did not see her PCP because her chest pain ended up being muscular.  8/20: Follow completed.  Now seeing a chiropractor for back pain.  9/24: PCP in six weeks.    11/19: She has not made a f/up yet because of covid.  Has an elderly aunt who lives with her and does not want to expose any of her family members.  Encouraged her to continue to follow healthy diet.  12/17: States doing well with diet.  Keeping f/up visits.  1/14: had follow up with her PCP yesterday, going to get labs done on 1/15.  Will make a follow up appointment on 1/19.    3/12: Has f/up with Dr. Madelin Rear on 4/27.  4/9: F/up with Dr. Madelin Rear on 4/27.  HH application to be submitted by 4/25 to be eligible for dexcom on 5/1.  5/7: Had apt with PCP, 4/28.  Pharmacy has been in contact with patient to work on diabetes program, scheduled for 5/12.  6/10: Has not gotten labs done.  Saturday, second covid shot.  Discussed [facility] and Be Well w/ Diabetes program.  She is enrolled but needs to call HH back.  States she will after we hang up.      . Demonstrates self-management strategies and behaviors for a healthy lifestylenter goal here      . Verbalizes importance of a healthy  lifestyle and impacts it has on health  o can lower risks for serious health problems such as high blood pressure, heart disease and diabetes;  o can prevent complications or exacerbations of chronic health conditions like Diabetes;  - Has been on decreased carb diet, A1C down to 9.5 from 12.7 in about 6 months  . Recognizes importance of completing follow up appointments with PCP and specialists;  Marland Kitchen Discussed healthy diet options   o generally: protein source (deck of cards = 3 ounces), five servings of fruits and vegetables (a piece of fruit or cup of vegetables) with every meal;  . Sleep 7-8 hours per night;  [place] Gammon importance of exercise as part of a daily routine   8/20: She hurt her back at work.  Did not file worker's comp claim because she didn't want to be told she would have to do covid testing to come back to work.  She is seeing a chiropractor regularly and is icing for 12 minutes every three to four.  9/24: Patient described meals she is eating - high protein, low carbs - Breakfast - eggs, cheese and shrimp or salmon, healthy snacks for lunch, dinner ground beef w/ cabbage and  potatoes.  Drinking water excessively, average 130 ml/day.  Her provider is aware.  Plans to go to her PCP for A1C check in about 6 weeks.  10/8: States she had salmon and eggs this morning for breakfast. States diet has been going well.  Leaves to go to [place], [place] on 10/16 to visit family.  Encouraged her to plan meals.    11/19: Patient states she just had an eye appointment and eyes are healthy.  No effects of diabetes.  She is still maintaining a healthy diet.  States she is eating egg and shrimp for breakfast.  States her son is becoming engaged in better diet.  12/17: Diet remains on track.  Likes my calls for accountability.  Asked me to keep calling until March.  Provided support and encouragement.    1/14: States she had a spell of eating bad. Had shrimp and eggs for breakfast, dinner [place] - ([place]  provided support and encouragement.  2/12: Eating salmon and eggs again.  Kids at home, going back to school in September.  Sleeping 6-7 hours a night. Migraine for last two days, now dull headache. Has not taken her Excedrin today, took last two days. Still doing good with water intake.   3/12: Patient states she is doing well.  No issues or concerns.  4/9: Doing well.  Patient to complete Be Well with Diabetes enrollment.  Walked her through how to get to the Be Well platform through [facility] central.  5/7: Will get labs drawn on Monday, 5/10 for A1C.    6/10: Just moved.  Juggling house and work.  Trouble with eating right due to stress.  Was rushed.  Still good with drinking water.  Nicknamed "[place]".  Feels she is in a good place to do better with diet.  Eating salmon and salads. Chicken breast, using ranch seasoning.              Patient's primary care provider relationship reviewed with patient and modified, as applicable.      Readiness to Change: []   Pre-contemplative    []   Contemplative  []   Preparation               [x]   Action                  []   Maintenance    Barriers/Challenges to Care: []   Decline in memory    []   Language barrier     []   Emotional                  []   Limited mobility  []   Lack of motivation     []  Vision, hearing or cognitive impairment []   Knowledge []  Financial Barriers []   Lack of support  []   Pain []   Other [x]   None    Key pt activities to achieve better health:   [x]   Weight loss  [x]   Improved diabetic control  []   Decreased cholesterol levels  []   Decreased blood pressure  []     []     Upcoming appointments: No future appointments.  Plan for next call: Call in four weeks, 7/8.

## 2019-10-09 NOTE — Telephone Encounter (Signed)
Dr. Madelin Rear, Hazel Sams, MD,    Your patient is currently enrolled in the Be Well with Diabetes program. After your patient's recent visit with a [facility] Clinical Pharmacist, the below were identified as opportunities to assist with their diabetes management:    Patient A1c not at goal:   Consider increase in metformin XR dose to 2000 mg per day. Patient states she would be willing to try this   Could consider conversion to tresiba since on high doses of lantus and changing lantus to BID not an option since patient has adherence issues. Tresiba longer acting, lower risk of hypoglycemia. Consider tresiba 80 units sq once a day   Could consider conversion to trulicity from ozempic to titrate to higher dose of trulicity to 4.5 mg   Consider mealtime insulin. If does mealtime insulin then patient would meet criteria to get freestyle libre or dexcom approved by insurance    Patient has not follow up on her thyroid nodule- has not found endocrinologist     Please send any prescriptions to [facility] for 90 day supply if you agree with any of the above recommendations. All recommendations would be covered at no cost through DM program.     See pharmacist note dated 10/09/19 for complete details.     To remain active in the program, the patient is to meet the requirements and documentation must be provided by pre-established deadlines (see below).       Program Requirements  Initial Program Requirements (to be completed by 10/17/2019):   Office visit with provider for DM (1st)   A1c (1st)    Ongoing Program Requirements (to be completed by 04/17/2020):   Office visit with provider for DM (2nd)   A1c (2nd)   Diabetes Education if A1c >8%   Lipid panel   Urine microalbumin    Pneumococcal vaccination (if applicable)   Influenza vaccination for Fall 2021   On statin or contraindication   On ACEi/ARB or contraindication    Thank you,  Vicki Mallet, PharmD, BCPS  Population Health  Pharmacy  [facility] Clinical Pharmacist  Direct: [phone]  Department: [phone], option 7

## 2019-10-09 NOTE — Telephone Encounter (Signed)
 POPULATION HEALTH CLINICAL PHARMACY REVIEW - Be Well with Diabetes    Alyssa [name] is a 39 y.o. female enrolled in the [facility] Employee Diabetes Program. Patient provided Clinical research associate with verbal consent to remain in the program for this year. Patient enrolled 09/17/19.    Has medical mutual and medicaid- was getting freestyle herlene but now she cant get freestyle libre or dexcom bc can not PA approved through either insurance    Medications:   Current Outpatient Medications   Medication Sig   . rosuvastatin  (CRESTOR ) 10 mg tablet TAKE ONE TABLET BY MOUTH NIGHTLY   . lisinopriL  (PRINIVIL , ZESTRIL ) 10 mg tablet TAKE 1 TABLET BY MOUTH ONE TIME A DAY   . fluticasone  propionate (FLONASE ) 50 mcg/actuation nasal spray 2 Sprays by Both Nostrils route daily.  - prn   . semaglutide (Ozempic) 1 mg/dose (2 mg/1.5 mL) sub-q pen 1 mg by SubCUTAneous route every seven (7) days.   . Insulin  Needles, Disposable, 31 gauge x 5/16 ndle Use with Lantus  Solostar pen   . lidocaine  (Lidoderm ) 5 % 1 Patch by TransDERmal route every twenty-four (24) hours. Apply patch to the affected area for 12 hours a day and remove for 12 hours a day.  Indications: pain   . ibuprofen  (MOTRIN ) 800 mg tablet Take 1 Tab by mouth every six (6) hours as needed for Pain. Indications: minor musculoskeletal injury, pain   . Blood-Glucose Meter,Continuous (Dexcom G6 Receiver) misc Use per package instructions e 11.9   . Blood-Glucose Sensor (Dexcom G6 Sensor) devi Use per package instruction e11.9   . Blood-Glucose Transmitter (Dexcom G6 Transmitter) devi Use per package instructio e11.9   . insulin  glargine (Lantus  Solostar U-100 Insulin ) 100 unit/mL (3 mL) inpn INJECT 80 UNITS UNDER THE SKIN AT BEDTIME   . Restasis 0.05 % dpet INSTILL 1 DROP INTO BOTH EYES TWICE A DAY   . flash glucose scanning reader (FreeStyle Libre 14 Day Reader) misc Check bg regularly, use per package instructions dx: insulin  dependent diabetes with history of hypoglycemia   .  flash glucose sensor (FreeStyle Libre 14 Day Sensor) kit Check bg regularly use per package instructions: dx insulin  dependent diabetes with history of hypoglycemia   . ibuprofen  (MOTRIN ) 600 mg tablet Take 1 Tab by mouth every six (6) hours as needed for Pain.   . metFORMIN  ER (GLUCOPHAGE  XR) 500 mg tablet TAKE 2 TABLETS BY MOUTH DAILY WITH DINNER   . True Metrix Glucose Test Strip strip TEST BLOOD SUGARS 1-3 TIMES DAILY OR AS DIRECTED.   [name] TRUE METRIX GLUCOSE METER misc USE TO CHECK BLOOD SUGAR 1 TO 3 TIMES A DAY AS DIRECTED   . lancets misc Blood sugar 1-3 times daily as directed by physician E 11.9   . Blood-Glucose Meter monitoring kit Check blood sugar 1-3 times daily as directed by physician E 11.9     Current Pharmacy: [facility] willing to switch to [facility]- but is getting everything for $0 through [facility] due to billing to her secondary insurance as well  Current testing supplies/frequency: does not have a meter was on freestyle- but has been without these since Sept 2020 and has not been checking sugars since then. interested in jazz- to test twice a day  Pen needles/syringes: has enough supply but just paid $65 for BD brand pen needles, is willing to switch to leader brand, discussed with pt    Allergies:  Allergies   Allergen Reactions   . Betadine [Povidone-Iodine] Hives and  Rash   . Codeine Nausea and Vomiting   . Curry Leaf-Tree Hives   . Sulfa (Sulfonamide Antibiotics) Rash   . Tramadol Itching      Vitals/Labs:  BP Readings from Last 3 Encounters:   07/10/19 (!) 160/80   05/23/19 130/72   04/09/19 (!) 138/90     Lab Results   Component Value Date/Time    Microalbumin/Creat ratio (mg/g creat) 19 05/13/2019 08:26 AM    Microalbumin,urine random 1.80 05/13/2019 08:26 AM     Lab Results   Component Value Date/Time    Hemoglobin A1c 10.2 (H) 05/13/2019 08:26 AM    Hemoglobin A1c 9.5 (H) 09/14/2018 02:42 PM    Hemoglobin A1c 12.7 (H) 06/07/2018 03:58 PM     Lab Results   Component Value Date/Time     Cholesterol, total 142 05/13/2019 08:26 AM    HDL Cholesterol 48 05/13/2019 08:26 AM    LDL, calculated 54 05/13/2019 08:26 AM    VLDL, calculated 40 05/13/2019 08:26 AM    Triglyceride 200 (H) 05/13/2019 08:26 AM    CHOL/HDL Ratio 3.0 05/13/2019 08:26 AM     ALT (SGPT)   Date Value Ref Range Status   05/13/2019 20 12 - 78 U/L Final     AST (SGOT)   Date Value Ref Range Status   05/13/2019 12 (L) 15 - 37 U/L Final     The 10-year ASCVD risk score Verdon DC Jr., et al., 2013) is: 11.7%    Values used to calculate the score:      Age: 39 years      Sex: Female      Is Non-Hispanic African American: Yes      Diabetic: Yes      Tobacco smoker: No      Systolic Blood Pressure: 160 mmHg      Is BP treated: Yes      HDL Cholesterol: 48 MG/DL      Total Cholesterol: 142 MG/DL     Lab Results   Component Value Date/Time    Creatinine 0.77 05/13/2019 08:26 AM     Estimated Creatinine Clearance: 125.3 mL/min (by C-G formula based on SCr of 0.77 mg/dL).    Lab Results   Component Value Date/Time    GFR est non-AA >60 05/13/2019 08:26 AM    GFR est AA >60 05/13/2019 08:26 AM       Immunizations:  Immunization History   Administered Date(s) Administered   . Influenza Vaccine 01/16/2018, 01/04/2019   . Influenza Vaccine (Quad) Mdck Pf (>4 Yrs Flucelvax QUAD [id number]) 01/04/2019   . Influenza Vaccine [employer]) PF (>6 Mo Flulaval, Fluarix, and >3 Yrs Afluria, Fluzone [id number]) 02/01/2017   . Pneumococcal Polysaccharide (PPSV-23) 03/24/2011      Social History:  Social History     Tobacco Use   . Smoking status: Former Smoker     Packs/day: 0.50     Years: 18.00     Pack years: 9.00     Quit date: 06/21/2008     Years since quitting: 11.3   . Smokeless tobacco: Never Used   Substance Use Topics   . Alcohol use: No     ASSESSMENT:  Initial Program Requirements (Y indicates has completed for the year, N indicates needs to be completed by 10/17/2019):  Yes - Provider Visit for DM (1st)  Yes - A1c (1st)     Ongoing Program Requirements (Y indicates  has completed for the year, N indicates needs to be completed  by 04/17/2020):  No - Provider Visit for DM (2nd)  Yes - ACC/diabetes educator visit (if A1c over 8%)- is seeing pharmD virtual visit from office  No - A1c (2nd)  Yes - Lipid panel  Yes - Urine microalbumin  Yes - Pneumococcal vaccination: Up to date, not needed again until 63  No - Influenza vaccination for Fall 2021  No - Medication adherence over 70%  Yes - On statin - rosuvastatin    Yes - On ACEi/ARB - lisinopril      Current medications eligible for copay waiver, up to $600, through [facility]:  - lantus , lisinopril , metformin  XR, rosuvastatin , ozempic  - Agamatrix Jazz     Diabetes Care:   - Glycemic Goal: <7.0% and directed by provider. Is not at blood glucose goal which may be related to missing doses of all her meds. Try pill-minder or she does have pill box and she also puts pen needle in there to reminder her to take lantus . Type 2 DM under poor control as evidenced by > 10. However, patient states she moves her pill box  Works 630pm-7am- she does have pill reminder set on her phone for Sat Sun Mon at 730am and sets it Tues thurs at 730pm for days she does not work. She did just set up pill alarms couple days ago and hoping this helps with adherence  - Current symptoms/problems include polyuria- but states she drinks a lot of water . States her urine is always clear from how much water  she drinks  - Home blood sugar records:  has not been checking  - Any episodes of hypoglycemia? yes - when was on glipizide  it was going low and patient was changing her diet at the same time. March of last year she had to be taken off glipizide . This is when A1c went from 12 to 10 but patient was dizzy and symptomatic. Aunt did have to call ambulance twice when patient was not responding due to low blood sugars and this is when glipizide  was stopped by pcp. Discussed the risks of being on insulin  and not checking sugars.   - Known  diabetic complications: none  - Eye exam current (within one year): yes  - Foot exam current (within one year): yes  - Appropriateness of Insulin  Therapy: managed by pcp. Discussed endo with pcp but has not seen one. She does have nodule on her thyroid and this is when endocrinologist was recommend. She still has not found an endo to see. Educated on importance of following up on this nodule. And also endo will help lower A1c  - Therapy Optimization: denies side effects to all meds except some diarrhea to metformin  sometimes. Could consider tresiba or toujeo  since on high dose lantus  and would not recommend splitting lantus  dose BID due to current adherence issues.  Could consider trulicity  to titrate to higher doses. Patient does already get recurrent yeast infections and SGL2 would not be good option. Is not on max dose of metformin - states does get diarrhea from metformin  when she misses her dose for like 3 days in a row. Patient states she would be willing to try increase in dose in metformin .   - Daily aspirin? No:   - Medication compliance: compliant most of the time- does admit to missing doses of all meds. 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   - Diet compliance: compliant most of the time. Due to stressors patient admits to  not following DM diet. She and her children did not have a place to live. She has found a place to stay and is getting back on track. Reviewed ADA diet recommendations.   - Current exercise: no regular exercise. Discussed increasing exercise    Other Considerations:  - Blood Pressure Goal: BP less than 140/90 mmHg due to history of DM: Is at blood pressure goal. Has wrist BP cuff- states it is 130/70-83  - Lipids: Patient has a 10-yr ASCVD risk of <20% with DM and is therefore a candidate for moderate-intensity statin therapy based on updated guidelines.  - Smoking status: former    PLAN:  - Consideration(s) for provider:    Patient should  highly consider endocrinologist- pcp has discussed with patient plus patient has a thyroid nodule she has not followed up about. Encouraged patient to find endocrinologist or follow up with pcp for nodule   Does need leader pen needle script- currently paying $65 for BD brand   agamatrix jazz BID   Does have A1c ordered- needs to go to lab to get done- Patient states will try to get done Monday morning after she gets off work   Pt wants CGM, fingers hurt if she checks, but does not meet criteria through our insurance for this- patient is aware would need mealtime insulin  to meet this requirement. Patient states she thinks same requirement through [employer] as well. Patient is not interested in starting mealtime insulin .    Increase metformin    Change to tesiba since on high doses of lantus  and has adherence issues so BID lantus  not really an option   Consider trulicity  to titrate to higher doses  - DM program gaps identified:    Initial requirements: Requirements met    Ongoing requirements: Provider visit for DM (2nd), ACC/diabetes educator visit (if A1c over 8%), A1c (2nd), Influenza vaccination for 2021-2022 and Medication adherence over 70%   - Education to patient: Discussed general issues about diabetes pathophysiology and management., Addressed diet and exercise, Reminded to get yearly retinal exam., Overview of Be Well With Diabetes program and Overview of [facility]   - Follow up: PCP for identified gaps or as scheduled below  - Upcoming appointments:   No future appointments.    [name], PharmD, BCPS  Population Health Pharmacy  [facility] Clinical Pharmacist  Direct: [phone]  Department: [phone], option 7

## 2019-10-09 NOTE — Telephone Encounter (Signed)
 Pharmacy Pop Care Documentation:   Called patient with reminder for requirements for Diabetes Management Program.     According to our records, patient is missing the following requirement(s) that must be completed by July 1st 2021:       2021 Annual Pharmacist Telephone Appointment       Spoke to patient at mobile number and advised them of the above information. Patient was available to do visit now. Advised her that RPh would be in touch shortly.            Caron Shone, CPhT  [facility]  Clinical Pharmacy Support Specialist  Department, toll free: [phone], option 7

## 2019-10-09 NOTE — Telephone Encounter (Signed)
Scheryl Marten, MD  - Patient currently does not have a meter. Interested in Publishing rights manager at no cost to patient if prescriptions provided  - Patient paying for pen needles- will pend [facility] preferred pen needles for patient to receive at no cost    Thank you,  Vicki Mallet, PharmD, BCPS  Population Health Pharmacy  [facility] Clinical Pharmacist  Direct: [phone]  Department: [phone], option 7

## 2019-10-10 MED ORDER — LANCETS
3 refills | Status: DC
Start: 2019-10-10 — End: 2021-03-31

## 2019-10-10 MED ORDER — INSULIN NEEDLES (DISPOSABLE) 31 X 5/16"
31 gauge x 5/16" | PACK | 3 refills | Status: DC
Start: 2019-10-10 — End: 2021-07-19
  Filled 2019-12-05: qty 75, 90d supply, fill #0

## 2019-10-10 MED ORDER — BLOOD GLUCOSE CONTROL, HIGH AND NORMAL SOLUTION
5 refills | Status: DC
Start: 2019-10-10 — End: 2020-04-21

## 2019-10-10 MED ORDER — WAVESENSE JAZZ STRIPS
ORAL_STRIP | 3 refills | Status: DC
Start: 2019-10-10 — End: 2020-04-21

## 2019-10-10 MED ORDER — BLOOD GLUCOSE METER KIT
PACK | 0 refills | Status: DC
Start: 2019-10-10 — End: 2020-04-21

## 2019-10-10 NOTE — Telephone Encounter (Signed)
Please call patient  Remind her she has pending diabetes labs to be drawn and then recommend f/up appt for DM management

## 2019-10-14 LAB — CBC WITH AUTO DIFFERENTIAL
Basophils %: 1 % (ref 0–1)
Basophils Absolute: 0.1 10*3/uL (ref 0.0–0.1)
Eosinophils %: 7 % (ref 0–7)
Eosinophils Absolute: 0.7 10*3/uL — ABNORMAL HIGH (ref 0.0–0.4)
Granulocyte Absolute Count: 0 10*3/uL (ref 0.00–0.04)
Hematocrit: 35.5 % (ref 35.0–47.0)
Hemoglobin: 11.1 g/dL — ABNORMAL LOW (ref 11.5–16.0)
Immature Granulocytes %: 0 % (ref 0.0–0.5)
Lymphocytes %: 37 % (ref 12–49)
Lymphocytes Absolute: 3.5 10*3/uL (ref 0.8–3.5)
MCH: 26.2 PG (ref 26.0–34.0)
MCHC: 31.3 g/dL (ref 30.0–36.5)
MCV: 83.9 FL (ref 80.0–99.0)
MPV: 11.8 FL (ref 8.9–12.9)
Monocytes %: 6 % (ref 5–13)
Monocytes Absolute: 0.6 10*3/uL (ref 0.0–1.0)
NRBC Absolute: 0 10*3/uL (ref 0.00–0.01)
Neutrophils %: 49 % (ref 32–75)
Neutrophils Absolute: 4.7 10*3/uL (ref 1.8–8.0)
Nucleated RBCs: 0 PER 100 WBC
Platelets: 410 10*3/uL — ABNORMAL HIGH (ref 150–400)
RBC: 4.23 M/uL (ref 3.80–5.20)
RDW: 14.9 % — ABNORMAL HIGH (ref 11.5–14.5)
WBC: 9.5 10*3/uL (ref 3.6–11.0)

## 2019-10-14 LAB — HEMOGLOBIN A1C W/EAG
Estimated Avg Glucose: 194 mg/dL
Hemoglobin A1C: 8.4 % — ABNORMAL HIGH (ref 4.0–5.6)

## 2019-10-14 LAB — CBC WITH AUTOMATED DIFF
ABS. BASOPHILS: 0.1 10*3/uL (ref 0.0–0.1)
ABS. EOSINOPHILS: 0.7 10*3/uL — ABNORMAL HIGH (ref 0.0–0.4)
ABS. IMM. GRANS.: 0 10*3/uL (ref 0.00–0.04)
ABS. LYMPHOCYTES: 3.5 10*3/uL (ref 0.8–3.5)
ABS. MONOCYTES: 0.6 10*3/uL (ref 0.0–1.0)
ABS. NEUTROPHILS: 4.7 10*3/uL (ref 1.8–8.0)
ABSOLUTE NRBC: 0 10*3/uL (ref 0.00–0.01)
BASOPHILS: 1 % (ref 0–1)
EOSINOPHILS: 7 % (ref 0–7)
HCT: 35.5 % (ref 35.0–47.0)
HGB: 11.1 g/dL — ABNORMAL LOW (ref 11.5–16.0)
IMMATURE GRANULOCYTES: 0 % (ref 0.0–0.5)
LYMPHOCYTES: 37 % (ref 12–49)
MCH: 26.2 PG (ref 26.0–34.0)
MCHC: 31.3 g/dL (ref 30.0–36.5)
MCV: 83.9 FL (ref 80.0–99.0)
MONOCYTES: 6 % (ref 5–13)
MPV: 11.8 FL (ref 8.9–12.9)
NEUTROPHILS: 49 % (ref 32–75)
NRBC: 0 PER 100 WBC
PLATELET: 410 10*3/uL — ABNORMAL HIGH (ref 150–400)
RBC: 4.23 M/uL (ref 3.80–5.20)
RDW: 14.9 % — ABNORMAL HIGH (ref 11.5–14.5)
WBC: 9.5 10*3/uL (ref 3.6–11.0)

## 2019-10-14 LAB — HEMOGLOBIN A1C WITH EAG
Est. average glucose: 194 mg/dL
Hemoglobin A1c: 8.4 % — ABNORMAL HIGH (ref 4.0–5.6)

## 2019-10-16 ENCOUNTER — Telehealth: Attending: Family Medicine | Primary: Family Medicine

## 2019-10-16 ENCOUNTER — Telehealth: Admit: 2019-10-16 | Discharge: 2019-10-16 | Payer: MEDICAID | Attending: Family Medicine | Primary: Family Medicine

## 2019-10-16 DIAGNOSIS — E1121 Type 2 diabetes mellitus with diabetic nephropathy: Secondary | ICD-10-CM

## 2019-10-16 MED ORDER — TRULICITY 0.75 MG/0.5 ML SUBCUTANEOUS PEN INJECTOR
0.75 mg/0.5 mL | SUBCUTANEOUS | 5 refills | Status: DC
Start: 2019-10-16 — End: 2019-12-05

## 2019-10-16 MED ORDER — METFORMIN SR 500 MG 24 HR TABLET
500 mg | ORAL_TABLET | Freq: Two times a day (BID) | ORAL | 1 refills | Status: DC
Start: 2019-10-16 — End: 2019-12-04

## 2019-10-16 NOTE — Progress Notes (Signed)
 Chief Complaint   Patient presents with   . Diabetes     follow up and discuss lab results

## 2019-10-16 NOTE — Progress Notes (Signed)
Alyssa [name] is a 39 y.o. female who was seen by synchronous (real-time) audio-video technology on 10/16/2019.      Consent: Adrian Specht, who was seen by synchronous (real-time) audio-video technology, and/or her healthcare decision maker, is aware that this patient-initiated, Telehealth encounter on 10/16/2019 is a billable service, with coverage as determined by her insurance carrier. She is aware that she may receive a bill and has provided verbal consent to proceed: Yes.    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 Thrombocytosis (HCC)  Just 10 above the ULN, will watch    3. Hypertriglyceridemia  Watching, labs due january2    4. Essential hypertension  bp at goal, compliant, c/w current          Pt was counseled on risks, benefits and alternatives of treatment options. All questions were asked and answered and the patient was agreeable with the treatment plan as outlined.    Subjective:   Alyssa [name] is a 39 y.o. female who was seen for Diabetes (follow up and discuss lab results )      HTN: patient reports stable on medications. No chest pain, sob, headache, blurred vision, leg swelling, diaphoresis, falls. Compliant with medications as prescribed. is checking blood pressures at home and reports range is 130s/80s. No significant hyper or hypotensive episodes that the patient reports today.    DM:  So happy regarding improvement  Living on her own is making some great strides for dietary changes  Lab Results   Component Value Date/Time    Hemoglobin A1c 8.4 (H) 10/14/2019 08:50 AM    Hemoglobin A1c 10.2 (H) 05/13/2019 08:26 AM    Hemoglobin A1c 9.5 (H) 09/14/2018 02:42 PM   was seeing pharmacist, is now willing to increase metformin, we had held  b/f b/c of gi se  Would be ok switching ozempic to trulicity in order to up titrate dosing  Does not want to add mealtime insulin          Medications, allergies, PMH, PSH, SOCH, [place] reviewed and updated per routine protocol, see chart for review and changes if not noted here.    ROS  A 12 point review of systems was negative except as noted here or in the HPI.    Objective:   Vital Signs: (As obtained by patient/caregiver at home)  Patient-Reported Vitals 10/16/2019   Patient-Reported Weight -   Patient-Reported Height -   Patient-Reported Pulse 107   Patient-Reported Temperature -   Patient-Reported SpO2 -   Patient-Reported Systolic  161   Patient-Reported Diastolic 87   Patient-Reported Peak Flow -   Patient-Reported LMP -        [INSTRUCTIONS:  "[x] " Indicates a positive item  "[] " Indicates a negative item  -- DELETE ALL ITEMS NOT EXAMINED]    Constitutional: [x]  Appears well-developed and well-nourished [x]  No apparent distress      []  Abnormal -     Mental status: [x]  Alert and awake  [x]  Oriented to person/place/time [x]  Able to follow commands    []  Abnormal -     Eyes:   EOM    [x]   Normal    []   Abnormal -   Sclera  [x]   Normal    []  Abnormal -          Discharge [x]   None visible   []  Abnormal -     HENT: [x]  Normocephalic, atraumatic  []  Abnormal -   [x]  Mouth/Throat: Mucous membranes are moist    External Ears [x]  Normal  []  Abnormal -    Neck: [x]  No visualized mass []  Abnormal -     Pulmonary/Chest: [x]  Respiratory effort normal   [x]  No visualized signs of difficulty breathing or respiratory distress        []  Abnormal -      Musculoskeletal:   []  Normal gait with no signs of ataxia         [x]  Normal range of motion of neck        []  Abnormal -     Neurological:        [x]  No Facial Asymmetry (Cranial nerve 7 motor function) (limited exam due to video visit)          [x]  No gaze palsy        []  Abnormal -          Skin:        [x]  No significant exanthematous lesions or discoloration noted on  facial skin         []  Abnormal -            Psychiatric:       [x]  Normal Affect []  Abnormal -        [x]  No Hallucinations    Other pertinent observable physical exam findings:well no distress    We discussed the expected course, resolution and complications of the diagnosis(es) in detail.  Medication risks, benefits, costs, interactions, and alternatives were discussed as indicated.  I advised her to contact the office if her condition worsens, changes or fails to improve as anticipated. She expressed understanding with the diagnosis(es) and plan.       Alyssa [name] is a 39 y.o. female who was evaluated by a video visit encounter for concerns as above. Patient identification was verified prior to start of the visit. A caregiver was present when appropriate. Due to this being a Scientist, physiological (During ZOXWR-60 public health emergency), evaluation of the following organ systems was limited: Vitals/Constitutional/EENT/Resp/CV/GI/GU/MS/Neuro/Skin/Heme-Lymph-Imm.  Pursuant to the emergency declaration under the [place], 1135 waiver authority and the [employer] Act, this Virtual  Visit was conducted, with patient's (and/or legal guardian's) consent, to reduce the patient's risk of exposure to COVID-19 and provide necessary medical care.     Services were provided through a video synchronous discussion virtually to substitute for in-person clinic visit.   Patient and provider were located at their individual homes.      Doneen Poisson, MD  [facility]  10/16/19 2:41 PM     Portions of this note may have been populated using smart dictation software and may have "sounds-like" errors present.

## 2019-10-17 MED FILL — PRODIGY TWIST TOP LANCETS 28  MISC: 90 days supply | Qty: 100 | Fill #0 | Status: AC

## 2019-10-17 MED FILL — PRODIGY NO CODING BLOOD GLUC  STRP: 90 days supply | Qty: 100 | Fill #0 | Status: AC

## 2019-10-17 MED FILL — LEADER PEN NEEDLE 31G X 8MM (5/16""): 90 days supply | Qty: 100 | Fill #0 | Status: AC

## 2019-10-17 MED FILL — PRODIGY LANCING DEVICE  MISC: 1 days supply | Qty: 1 | Fill #0 | Status: AC

## 2019-10-17 MED FILL — PRODIGY AUTOCODE BLOOD GLUCO  DEVI: 1 days supply | Qty: 1 | Fill #0 | Status: AC

## 2019-10-24 NOTE — Progress Notes (Signed)
 Follow up phone call to patient, two pt identifiers verified. Discussed patient's goals:   Goals     . Completes all follow-up appointments      . Educate and encourage importance of FU for prevention of complications or disease;  . Assess the patient's relationship with a PCP and next FU visit scheduled;  o States she will call on Monday to make her FU appt.  . Discuss importance of adherence to treatment plan and follow up visits;  . Identify any barriers in transportation or access to FU appointments.   . Assist patient with making FU appointments as needed;   . It's also a good idea to know your test results.   [name] Keep a list of the medicines you take.  7/20: Patient states she did get an appointment to see her PCP.  Has cardio appt on 8/6.  7/30: Follow up - Cardio 8/6.  Did not see her PCP because her chest pain ended up being muscular.  8/20: Follow completed.  Now seeing a chiropractor for back pain.  9/24: PCP in six weeks.    11/19: She has not made a f/up yet because of covid.  Has an elderly aunt who lives with her and does not want to expose any of her family members.  Encouraged her to continue to follow healthy diet.  12/17: States doing well with diet.  Keeping f/up visits.  1/14: had follow up with her PCP yesterday, going to get labs done on 1/15.  Will make a follow up appointment on 1/19.    3/12: Has f/up with Dr. Olita on 4/27.  4/9: F/up with Dr. Olita on 4/27.  HH application to be submitted by 4/25 to be eligible for dexcom on 5/1.  5/7: Had apt with PCP, 4/28.  Pharmacy has been in contact with patient to work on diabetes program, scheduled for 5/12.  6/10: Has not gotten labs done.  Saturday, second covid shot.  Discussed [facility] and Be Well w/ Diabetes program.  She is enrolled but needs to call HH back.  States she will after we hang up.  7/8: Patient had f/up with her PCP on 6/30, A1C was down to 8.4 (10.2 in January).  Next appointment is in September.      . Demonstrates  self-management strategies and behaviors for a healthy lifestylenter goal here      . Verbalizes importance of a healthy lifestyle and impacts it has on health  o can lower risks for serious health problems such as high blood pressure, heart disease and diabetes;  o can prevent complications or exacerbations of chronic health conditions like Diabetes;  - Has been on decreased carb diet, A1C down to 9.5 from 12.7 in about 6 months  . Recognizes importance of completing follow up appointments with PCP and specialists;  [name] Discussed healthy diet options   o generally: protein source (deck of cards = 3 ounces), five servings of fruits and vegetables (a piece of fruit or cup of vegetables) with every meal;  . Sleep 7-8 hours per night;  [name] [place] importance of exercise as part of a daily routine   8/20: She hurt her back at work.  Did not file worker's comp claim because she didn't want to be told she would have to do covid testing to come back to work.  She is seeing a chiropractor regularly and is icing for 12 minutes every three to four.  9/24: Patient described meals she is eating -  high protein, low carbs - Breakfast - eggs, cheese and shrimp or salmon, healthy snacks for lunch, dinner ground beef w/ cabbage and potatoes.  Drinking water  excessively, average 130 ml/day.  Her provider is aware.  Plans to go to her PCP for A1C check in about 6 weeks.  10/8: States she had salmon and eggs this morning for breakfast. States diet has been going well.  Leaves to go to [place], [place] on 10/16 to visit family.  Encouraged her to plan meals.    11/19: Patient states she just had an eye appointment and eyes are healthy.  No effects of diabetes.  She is still maintaining a healthy diet.  States she is eating egg and shrimp for breakfast.  States her son is becoming engaged in better diet.  12/17: Diet remains on track.  Likes my calls for accountability.  Asked me to keep calling until March.  Provided support and  encouragement.    1/14: States she had a spell of eating bad. Had shrimp and eggs for breakfast, dinner [place] provided support and encouragement.  2/12: Eating salmon and eggs again.  Kids at home, going back to school in September.  Sleeping 6-7 hours a night. Migraine for last two days, now dull headache. Has not taken her Excedrin today, took last two days. Still doing good with water  intake.   3/12: Patient states she is doing well.  No issues or concerns.  4/9: Doing well.  Patient to complete Be Well with Diabetes enrollment.  Walked her through how to get to the Be Well platform through [facility].  5/7: Will get labs drawn on Monday, 5/10 for A1C.    6/10: Just moved.  Juggling house and work.  Trouble with eating right due to stress.  Was rushed.  Still good with drinking water .  [employer].  Feels she is in a good place to do better with diet.  Eating salmon and salads. Chicken breast, using ranch seasoning.    7/8: Patient feels her stress level is much better, she is eating better, as well.            Patient's primary care provider relationship reviewed with patient and modified, as applicable.      Readiness to Change: []   Pre-contemplative    []   Contemplative  []   Preparation               [x]   Action                  []   Maintenance    Barriers/Challenges to Care: []   Decline in memory    []   Language barrier     []   Emotional                  []   Limited mobility  []   Lack of motivation     []  Vision, hearing or cognitive impairment []   Knowledge []  Financial Barriers []   Lack of support  []   Pain []   Other [x]   None    Key pt activities to achieve better health:   [x]   Weight loss  [x]   Improved diabetic control  []   Decreased cholesterol levels  []   Decreased blood pressure  []     []     Upcoming appointments: In September (appt not made yet)  Plan for next call: Call in 4 weeks, 8/5.

## 2019-11-21 NOTE — Progress Notes (Signed)
Attempt to reach patient for follow up. Discreet VM left with contact information. Will attempt another outreach in three weeks, 8/26.

## 2019-12-03 ENCOUNTER — Encounter

## 2019-12-03 NOTE — Telephone Encounter (Signed)
 POPULATION HEALTH CLINICAL PHARMACY REVIEW - BE WELL WITH DIABETES: HIGH A1C  =============================================  Alyssa [name] is a 39 y.o. female enrolled in the [facility] Be Well with Diabetes program.    Identified care gap(s): A1c > 8%    Incoming call from patient regarding trulicity  questions    DM Program Prescriptions:  Current Outpatient Medications   Medication Sig Dispense Refill   . dulaglutide  (Trulicity ) 0.75 mg/0.5 mL sub-q pen 0.5 mL every seven (7) days for 30 days, THEN 1 mL every seven (7) days for 30 days, THEN 2 mL every seven (7) days for 30 days, THEN 3 mL every seven (7) days for 30 days. 6 mL 5   . metFORMIN  ER (GLUCOPHAGE  XR) 500 mg tablet Take 2 Tablets by mouth Before breakfast and dinner. 360 Tablet 1   . Insulin  Needles, Disposable, (Comfort EZ Pen Needles) 31 gauge x 5/16 ndle Leader brand pen needle to use once a day with basal insulin  100 Package 3   . Blood-Glucose Meter (Jazz Wireless 2 Meter kit) monitoring kit Use as directed to test blood sugars 1 Kit 0   . glucose blood VI test strips [facility] Jazz) strip Use to test blood sugars one time daily or as directed 100 Strip 3   . Blood Glucose Cntl Hi & Normal (AgaMatrix Control Norm-Hi) soln Use to calibrate agamatrix meter 1 Each 5   . lancets misc Use to test blood sugars once a day as directed 100 Each 3   . fluticasone  propionate (Flonase  Allergy Relief) 50 mcg/actuation nasal spray 2 Sprays by Both Nostrils route daily as needed for Allergies.     . rosuvastatin  (CRESTOR ) 10 mg tablet TAKE ONE TABLET BY MOUTH NIGHTLY 90 Tablet 0   . lisinopriL  (PRINIVIL , ZESTRIL ) 10 mg tablet TAKE 1 TABLET BY MOUTH ONE TIME A DAY 90 Tablet 0   . ibuprofen  (MOTRIN ) 800 mg tablet Take 1 Tab by mouth every six (6) hours as needed for Pain. Indications: minor musculoskeletal injury, pain 30 Tab 0   . Blood-Glucose Meter,Continuous (Dexcom G6 Receiver) misc Use per package instructions e 11.9 1 Each 5   . Blood-Glucose  Sensor (Dexcom G6 Sensor) devi Use per package instruction e11.9 10 Device 2   . Blood-Glucose Transmitter (Dexcom G6 Transmitter) devi Use per package instructio e11.9 1 Device 5   . insulin  glargine (Lantus  Solostar U-100 Insulin ) 100 unit/mL (3 mL) inpn INJECT 80 UNITS UNDER THE SKIN AT BEDTIME 15 Pen 5   . flash glucose scanning reader (FreeStyle Libre 14 Day Reader) misc Check bg regularly, use per package instructions dx: insulin  dependent diabetes with history of hypoglycemia 1 Each 11   . flash glucose sensor (FreeStyle Libre 14 Day Sensor) kit Check bg regularly use per package instructions: dx insulin  dependent diabetes with history of hypoglycemia 1 Kit 11        Allergies:  Allergies   Allergen Reactions   . Betadine [Povidone-Iodine] Hives and Rash   . Codeine Nausea and Vomiting   . Curry Leaf-Tree Hives   . Sulfa (Sulfonamide Antibiotics) Rash   . Tramadol Itching        Labs:  No components found for: LABA1C  CrCl cannot be calculated (Patient's most recent lab result is older than the maximum 180 days allowed.).    Results for MONAE, TOPPING (MRN [id number]) as of 12/03/2019 16:02   Ref. Range 05/13/2019 08:26 10/14/2019 08:50   Hemoglobin A1c, (calculated) Latest Ref Range: 4.0 -  5.6 % 10.2 (H) 8.4 (H)       Care Team:   - Physician (PCP): Olita (Last OV: 10/16/19)  - ACC/RDBETHA Ferrier (Last Encounter: 10/24/19)  - Upcoming appointments:   No future appointments.    Refill History:   Metformin  XR 09/10/19 for 1000 mg per day  Metformin  XR in process now for 1000 mg BID  ozempic 0.5 mg 05/24/19, 1 mg 08/15/19-  Now going to switch to trulicity  to titrate to 4.5 mg dose to help lower A1c. trulicity  in process now  Lantus - 05/27/19 for 56 ds, 09/10/19 for 56 ds    Diabetes Care:  - Glycemic Goal: <7.0% and directed by provider. Is not at blood glucose goal but making changes to reach A1c goal.  - Appropriateness of Insulin  Therapy: lantus , but should try to increase quanity to 90 ds  - Therapy Optimization: has  started higher dose of metformin  and will titrate to highest dose of trulicity     Plan:    Provider did increase metformin  as per pharmacy recommendation in June. Patient has been taking 1000 mg BID, denies side effects.     Patient is now converting to trulicity  to titrate to higher doses. Will start at 0.75 and do each dose for 4 weeks until she reaches 4.5 mg weekly. trulicity  in process. Educated patient regarding trulicity  sig/titration schedule.     Should increase lantus  script to 90 ds    Will start filling at mail order pharmacy.      [name], PharmD, BCPS  Population Health Pharmacy  [facility] Clinical Pharmacist  Department: [phone]  ===================================================  For Pharmacy Admin Tracking Only    . Gap Closed?: Yes  . Time Spent (min): 20

## 2019-12-04 MED ORDER — METFORMIN SR 500 MG 24 HR TABLET
500 mg | ORAL_TABLET | ORAL | 0 refills | Status: DC
Start: 2019-12-04 — End: 2020-03-27
  Filled 2019-12-06: qty 360, 90d supply, fill #0

## 2019-12-04 MED ORDER — ROSUVASTATIN 10 MG TAB
10 mg | ORAL_TABLET | ORAL | 0 refills | Status: DC
Start: 2019-12-04 — End: 2020-04-27
  Filled 2019-12-05: qty 90, 90d supply, fill #0

## 2019-12-04 MED ORDER — LISINOPRIL 10 MG TAB
10 mg | ORAL_TABLET | ORAL | 0 refills | Status: DC
Start: 2019-12-04 — End: 2020-04-27
  Filled 2019-12-05: qty 90, 90d supply, fill #0

## 2019-12-04 NOTE — Telephone Encounter (Signed)
Alyssa [name] called from [facility] requesting call back regarding prescription for Trulicity written in June by Dr. Aleene Davidson pt is filling today and has questions regarding directions for titration since it has 4 sets of directions.     Please call [phone] to advise. Ldm

## 2019-12-04 NOTE — Telephone Encounter (Signed)
Information regarding your request  Prior Authorization is not required for this medication dosage form and strength at the quantity and days supply requested.  ~Trulicity

## 2019-12-04 NOTE — Telephone Encounter (Signed)
Called and spoke with pharmacist.   She advises they are unable to dispense titration insulin pens due to each set of instructions needing it's own RX, and since pt will be increasing every 30 days, a 90 day supply may not be appropriate.     It will be best for pt's insulin to be sent to a local pharmacy,  [facility], the only local pharmacy approved by [facility],, until pt reaches the desired dosage, then send to mail order.     Called pt, and left a voice message, asking that she call the office back in regards to her medication.   Need to know if she wants medication sent to [facility], as a 30 day supply, or [facility].

## 2019-12-05 MED ORDER — TRULICITY 0.75 MG/0.5 ML SUBCUTANEOUS PEN INJECTOR
0.75 mg/0.5 mL | SUBCUTANEOUS | 0 refills | Status: DC
Start: 2019-12-05 — End: 2019-12-30
  Filled 2019-12-05: qty 2, 28d supply, fill #0

## 2019-12-05 NOTE — Addendum Note (Signed)
Addendum Note by Madelin Rear, MD at 12/05/19 1251                Author: Madelin Rear, MD  Service: --  Author Type: Physician       Filed: 12/05/19 1251  Encounter Date: 12/04/2019  Status: Signed          Editor: Madelin Rear, MD (Physician)          Addended by: Madelin Rear on: 12/05/2019 12:51 PM    Modules accepted: Orders

## 2019-12-05 NOTE — Telephone Encounter (Signed)
Called and spoke with pt, and she has been advised and states understanding of this and states she was taking Ozempic once weekly at night and will do the same.

## 2019-12-05 NOTE — Telephone Encounter (Signed)
Pt returned call and requests 30 day prescription for Trulicity be sent to [place]. Ldm

## 2019-12-05 NOTE — Telephone Encounter (Signed)
Take once a week whenever she was taking her ozempic it is a replacement

## 2019-12-05 NOTE — Addendum Note (Signed)
Addendum Note  by Word, Shana N at 12/05/19 1034                Author: Word, Alen Blew  Service: --  Author Type: Medical Assistant       Filed: 12/05/19 1034  Encounter Date: 12/04/2019  Status: Signed          Editor: Word, Alen Blew Engineer, building services)          Addended by: Maudry Diego on: 12/05/2019 10:34 AM    Modules accepted: Orders

## 2019-12-05 NOTE — Telephone Encounter (Signed)
Pt called back after picking up new prescription for Trulicity, but wants to know if she should take it at night or when Dr. Aleene Davidson wants her to take it. Please advise. Ldm

## 2019-12-06 ENCOUNTER — Ambulatory Visit: Attending: Obstetrics & Gynecology | Primary: Family Medicine

## 2019-12-06 ENCOUNTER — Ambulatory Visit
Admit: 2019-12-06 | Discharge: 2019-12-06 | Payer: PRIVATE HEALTH INSURANCE | Attending: Obstetrics & Gynecology | Primary: Family Medicine

## 2019-12-06 DIAGNOSIS — N9089 Other specified noninflammatory disorders of vulva and perineum: Secondary | ICD-10-CM

## 2019-12-06 MED ORDER — VALACYCLOVIR 500 MG TAB
500 mg | ORAL_TABLET | Freq: Two times a day (BID) | ORAL | 1 refills | Status: DC
Start: 2019-12-06 — End: 2020-04-27
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
## 2019-12-06 NOTE — Progress Notes (Signed)
Seen by patient [name] on 12/20/2019 10:21 AM EDT

## 2019-12-06 NOTE — Progress Notes (Signed)
Lab abnormal.

## 2019-12-12 LAB — HERPES SIMPLEX VIRUS (HSV) NAA
HSV 1, NAA: NEGATIVE
HSV 2, NAA: POSITIVE — AB

## 2019-12-12 NOTE — Progress Notes (Signed)
 Follow up phone call to patient, two pt identifiers verified. Discussed patient's goals:   Goals     . Completes all follow-up appointments      . Educate and encourage importance of FU for prevention of complications or disease;  . Assess the patient's relationship with a PCP and next FU visit scheduled;  o States she will call on Monday to make her FU appt.  . Discuss importance of adherence to treatment plan and follow up visits;  . Identify any barriers in transportation or access to FU appointments.   . Assist patient with making FU appointments as needed;   . It's also a good idea to know your test results.   [name] Keep a list of the medicines you take.  7/20: Patient states she did get an appointment to see her PCP.  Has cardio appt on 8/6.  7/30: Follow up - Cardio 8/6.  Did not see her PCP because her chest pain ended up being muscular.  8/20: Follow completed.  Now seeing a chiropractor for back pain.  9/24: PCP in six weeks.    11/19: She has not made a f/up yet because of covid.  Has an elderly aunt who lives with her and does not want to expose any of her family members.  Encouraged her to continue to follow healthy diet.  12/17: States doing well with diet.  Keeping f/up visits.  1/14: had follow up with her PCP yesterday, going to get labs done on 1/15.  Will make a follow up appointment on 1/19.    3/12: Has f/up with Dr. Olita on 4/27.  4/9: F/up with Dr. Olita on 4/27.  HH application to be submitted by 4/25 to be eligible for dexcom on 5/1.  5/7: Had apt with PCP, 4/28.  Pharmacy has been in contact with patient to work on diabetes program, scheduled for 5/12.  6/10: Has not gotten labs done.  Saturday, second covid shot.  Discussed [facility] and Be Well w/ Diabetes program.  She is enrolled but needs to call HH back.  States she will after we hang up.  7/8: Patient had f/up with her PCP on 6/30, A1C was down to 8.4 (10.2 in January).  Next appointment is in September.      . Demonstrates  self-management strategies and behaviors for a healthy lifestyle      . Verbalizes importance of a healthy lifestyle and impacts it has on health  o can lower risks for serious health problems such as high blood pressure, heart disease and diabetes;  o can prevent complications or exacerbations of chronic health conditions like Diabetes;  - Has been on decreased carb diet, A1C down to 9.5 from 12.7 in about 6 months  . Recognizes importance of completing follow up appointments with PCP and specialists;  [name] Discussed healthy diet options   o generally: protein source (deck of cards = 3 ounces), five servings of fruits and vegetables (a piece of fruit or cup of vegetables) with every meal;  . Sleep 7-8 hours per night;  [name] [place] importance of exercise as part of a daily routine   8/20: She hurt her back at work.  Did not file worker's comp claim because she didn't want to be told she would have to do covid testing to come back to work.  She is seeing a chiropractor regularly and is icing for 12 minutes every three to four.  9/24: Patient described meals she is eating - high  protein, low carbs - Breakfast - eggs, cheese and shrimp or salmon, healthy snacks for lunch, dinner ground beef w/ cabbage and potatoes.  Drinking water  excessively, average 130 ml/day.  Her provider is aware.  Plans to go to her PCP for A1C check in about 6 weeks.  10/8: States she had salmon and eggs this morning for breakfast. States diet has been going well.  Leaves to go to [place] on 10/16 to visit family.  Encouraged her to plan meals.    11/19: Patient states she just had an eye appointment and eyes are healthy.  No effects of diabetes.  She is still maintaining a healthy diet.  States she is eating egg and shrimp for breakfast.  States her son is becoming engaged in better diet.  12/17: Diet remains on track.  Likes my calls for accountability.  Asked me to keep calling until March.  Provided support and encouragement.    1/14:  States she had a spell of eating bad. Had shrimp and eggs for breakfast, dinner [place] - [place] provided support and encouragement.  2/12: Eating salmon and eggs again.  Kids at home, going back to school in September.  Sleeping 6-7 hours a night. Migraine for last two days, now dull headache. Has not taken her Excedrin today, took last two days. Still doing good with water  intake.   3/12: Patient states she is doing well.  No issues or concerns.  4/9: Doing well.  Patient to complete Be Well with Diabetes enrollment.  Walked her through how to get to the Be Well platform through [facility].  5/7: Will get labs drawn on Monday, 5/10 for A1C.    6/10: Just moved.  Juggling house and work.  Trouble with eating right due to stress.  Was rushed.  Still good with drinking water .  [employer].  Feels she is in a good place to do better with diet.  Eating salmon and salads. Chicken breast, using ranch seasoning.    7/8: Patient feels her stress level is much better, she is eating better, as well.  8/26: Stress is better. A1C was down to 8.4 (from 10.2).              Patient's primary care provider relationship reviewed with patient and modified, as applicable.    Patient states she is doing well.  She is getting ready to get up to go to work.  No needs or concerns at this time. Congratulated her on her decreased A1C of 8.4 (down from 10.2).        Readiness to Change: []   Pre-contemplative    []   Contemplative  []   Preparation               []   Action                  []   Maintenance    Barriers/Challenges to Care: []   Decline in memory    []   Language barrier     []   Emotional                  []   Limited mobility  []   Lack of motivation     []  Vision, hearing or cognitive impairment []   Knowledge []  Financial Barriers []   Lack of support  []   Pain []   Other []   None    Key pt activities to achieve better health:   [x]   Weight loss  [x]   Improved diabetic control  []   Decreased cholesterol levels  []    Decreased blood pressure  []     []       Plan for next call: Will call in one month.9/23.

## 2019-12-30 MED ORDER — TRULICITY 0.75 MG/0.5 ML SUBCUTANEOUS PEN INJECTOR
0.75 mg/0.5 mL | PEN_INJECTOR | SUBCUTANEOUS | 3 refills | Status: AC
Start: 2019-12-30 — End: 2020-01-29
  Filled 2019-12-31: qty 6, 84d supply, fill #0

## 2019-12-30 NOTE — Telephone Encounter (Signed)
Telephone Encounter by Madelin Rear, MD at 12/30/19 1357                Author: Madelin Rear, MD  Service: --  Author Type: Physician       Filed: 12/30/19 1357  Encounter Date: 12/30/2019  Status: Signed          Editor: Madelin Rear, MD (Physician)          From: Aron Baba: Madelin Rear, MDSent: 12/30/2019 12:27 PM EDTSubject: Prescription QuestionI called [facility] to order my next trulicity  but there was not prescription on file for my next level. Can you please send the new prescription to [facility]? Thanks

## 2020-01-02 ENCOUNTER — Inpatient Hospital Stay
Admit: 2020-01-02 | Discharge: 2020-01-02 | Disposition: A | Discharge status: Home / Self Care | Payer: PRIVATE HEALTH INSURANCE | Attending: Emergency Medicine

## 2020-01-02 DIAGNOSIS — Z20822 Contact with and (suspected) exposure to covid-19: Secondary | ICD-10-CM

## 2020-01-02 MED ORDER — KETOROLAC TROMETHAMINE 30 MG/ML INJECTION
30 mg/mL (1 mL) | INTRAMUSCULAR | Status: AC
Start: 2020-01-02 — End: 2020-01-02
  Administered 2020-01-02: 13:00:00 via INTRAMUSCULAR

## 2020-01-02 MED FILL — KETOROLAC TROMETHAMINE 30 MG/ML INJECTION: 30 mg/mL (1 mL) | INTRAMUSCULAR | Qty: 1

## 2020-01-02 NOTE — ED Notes (Signed)
Patient discharged from the lobby by provider-  No primary RN assigned

## 2020-01-02 NOTE — Telephone Encounter (Signed)
 Onset   Diarrhea onset this am  3 x last hour  Dizziness 2 days ago has to hold onto something  A little sob at rest last night  Headache now 8/10 started 3 days ago intermittent with Tylenol  but comes back  Covid tested at [facility] yesterday and pending    DM- lantus , trulicity  and metformin   Last night BS 138  Well controlled  Does not feel low has not checked today    Sent to ED    Reason for Disposition  . SEVERE dizziness (e.g., unable to stand, requires support to walk, feels like passing out now)    Answer Assessment - Initial Assessment Questions  1. DESCRIPTION: Describe your dizziness.  Tilting, feels like falling when standing    2. LIGHTHEADED: Do you feel lightheaded? (e.g., somewhat faint, woozy, weak upon standing)  At rest room is tilting has to hold onto something    3. VERTIGO: Do you feel like either you or the room is spinning or tilting? (i.e. vertigo)  Tilting    4. SEVERITY: How bad is it?  Do you feel like you are going to faint? Can you stand and walk?          - SEVERE - unable to stand, requires support to walk, feels like passing out now.       5. ONSET:  When did the dizziness begin?  2 days ago but worsening    6. AGGRAVATING FACTORS: Does anything make it worse? (e.g., standing, change in head position)  Standing, ambulating,     7. HEART RATE: Can you tell me your heart rate? How many beats in 15 seconds?  (Note: not all patients can do this)    Does not feel like hear is beating fast    8. CAUSE: What do you think is causing the dizziness?   unsure  9. RECURRENT SYMPTOM: Have you had dizziness before? If so, ask: When was the last time? What happened that time?   NO    10. OTHER SYMPTOMS: Do you have any other symptoms? (e.g., fever, chest pain, vomiting, diarrhea, bleeding)   diarrhea, sob-mild, no wheeze or cough    11. PREGNANCY: Is there any chance you are pregnant? When was your last menstrual period?  No s/p HYS    Protocols used:  DIZZINESS - LIGHTHEADEDNESS-ADULT-AH

## 2020-01-02 NOTE — ED Notes (Signed)
Pt presents with covid symptoms. Pt was tested yesterday at [facility] but test results aren't back yet.
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
## 2020-01-03 NOTE — Progress Notes (Signed)
Date/Time:  01/03/2020 11:33 AM  Covering for Rebeca Allegra, RN  ECM  Attempted to reach patient by telephone. Left HIPPA compliant message requesting a return call. Will attempt to reach patient again.

## 2020-01-06 ENCOUNTER — Telehealth: Attending: Family Medicine | Primary: Family Medicine

## 2020-01-06 ENCOUNTER — Telehealth
Admit: 2020-01-06 | Discharge: 2020-01-06 | Payer: PRIVATE HEALTH INSURANCE | Attending: Family Medicine | Primary: Family Medicine

## 2020-01-06 DIAGNOSIS — B349 Viral infection, unspecified: Secondary | ICD-10-CM

## 2020-01-06 NOTE — Telephone Encounter (Signed)
Pt has been scheduled for today.

## 2020-01-06 NOTE — Progress Notes (Signed)
 Chief Complaint   Patient presents with   . Diarrhea     x 1 week.   . Dizziness     x 5 days.     1. Have you been to the ER, urgent care clinic since your last visit?  Hospitalized since your last visit? Yes, 12/30/2019 [facility],Covid screening. (Negative)    2. Have you seen or consulted any other health care providers outside of the [facility] since your last visit?  Include any pap smears or colon screening. No

## 2020-01-06 NOTE — Telephone Encounter (Signed)
-----   Message from Park Breed sent at 01/04/2020 12:58 PM EDT -----  Regarding: Dr. Otho Ket  Contact: [phone]  General Message/Vendor Calls    Caller's first and last name: Pt.       Reason for call: Pt still having some symptoms, including diarrhea, needs to know what to take so pt can go back to work. Pt is also taking anti nausea medication but it is not helping.       Callback required yes/no and why: Yes, call from nurse.       Best contact number(s): [phone]      Details to clarify the request: N/a.      Park Breed

## 2020-01-06 NOTE — Progress Notes (Signed)
Alyssa [name] is a 39 y.o. female who was seen by synchronous (real-time) audio-video technology on 01/06/2020.      Consent: Barney Gertsch, who was seen by synchronous (real-time) audio-video technology, and/or her healthcare decision maker, is aware that this patient-initiated, Telehealth encounter on 01/06/2020 is a billable service, with coverage as determined by her insurance carrier. She is aware that she may receive a bill and has provided verbal consent to proceed: Yes.    Assessment & Plan:       ICD-10-CM ICD-9-CM    1. Viral illness  B34.9 079.99    2. Type 2 diabetes mellitus with complication, with long-term current use of insulin (HCC)  E11.8 250.90     Z79.4 V58.67    3. Dizziness  R42 780.4      Viral illness  Discussed supportive care  With being vaccinated and kids being sick and ALL testing negative, likely negative truly  Recommend f/u if not better in 14 days from onset  Dizziness likely related to inner ear/allergies--restart flonase  DM is better controlled than just 2 m ago, will keep an eye on this          Pt was counseled on risks, benefits and alternatives of treatment options. All questions were asked and answered and the patient was agreeable with the treatment plan as outlined.  Total time on the date of encounter exceeded 30 minutes and included patient care, coordination of care, charting and preparation for visit.    Subjective:   Alyssa [name] is a 39 y.o. female who was seen for Diarrhea (x 1 week.) and Dizziness (x 5 days.)    First day of symptoms was dizziness Monday last week   Testing Wednesday of that week b/c she was having diarrhea and headache (had persisting dizziness)    Having diarrhea and dizziness still  3-4 episodes daily  2 of those times she has "incomplete emptying"    The day the dizziness began she ate late and had    The bottom of her back is tight and she keeps moving around, doing exercises trying to get herself better/loosened up    Has lost about 5 lbs she  thinks    When she started her symptoms her kid also had a stomach ache and headache, he had lost taste and smell and he got tested and was negative for covid  Her daughter started with same symptoms as well, and she was really tired and got tested too    Sugar has been "ok" --usually in the 130s, sometimes down into the 120s    Lab Results   Component Value Date/Time    Hemoglobin A1c 8.4 (H) 10/14/2019 08:50 AM   reports difficult to maintain eating how she should, she has found it more expensive to eat "the right way" and it is easier to buy bulk bags of foods and      Medications, allergies, PMH, PSH, SOCH, [facility] reviewed and updated per routine protocol, see chart for review and changes if not noted here.    ROS  A 12 point review of systems was negative except as noted here or in the HPI.    Objective:   Vital Signs: (As obtained by patient/caregiver at home)  Patient-Reported Vitals 01/06/2020   Patient-Reported Weight 280lb   Patient-Reported Height -   Patient-Reported Pulse 112   Patient-Reported Temperature -   Patient-Reported SpO2 -   Patient-Reported Systolic  -   Patient-Reported Diastolic -   Patient-Reported  Peak Flow -   Patient-Reported LMP -        [INSTRUCTIONS:  "[x] " Indicates a positive item  "[] " Indicates a negative item  -- DELETE ALL ITEMS NOT EXAMINED]    Constitutional: [x]  Appears well-developed and well-nourished [x]  No apparent distress      []  Abnormal -     Mental status: [x]  Alert and awake  [x]  Oriented to person/place/time [x]  Able to follow commands    []  Abnormal -     Eyes:   EOM    [x]   Normal    []  Abnormal -   Sclera  [x]   Normal    []  Abnormal -          Discharge [x]   None visible   []  Abnormal -     HENT: [x]  Normocephalic, atraumatic  []  Abnormal -   [x]  Mouth/Throat: Mucous membranes are moist    External Ears [x]  Normal  []  Abnormal -    Neck: [x]  No visualized mass []  Abnormal -     Pulmonary/Chest: [x]  Respiratory effort normal   [x]  No visualized signs of difficulty  breathing or respiratory distress        []  Abnormal -      Musculoskeletal:   []  Normal gait with no signs of ataxia         [x]  Normal range of motion of neck        []  Abnormal -     Neurological:        [x]  No Facial Asymmetry (Cranial nerve 7 motor function) (limited exam due to video visit)          [x]  No gaze palsy        []  Abnormal -          Skin:        [x]  No significant exanthematous lesions or discoloration noted on facial skin         []  Abnormal -            Psychiatric:       [x]  Normal Affect []  Abnormal -        [x]  No Hallucinations    Other pertinent observable physical exam findings:well, no distress, non toxic    We discussed the expected course, resolution and complications of the diagnosis(es) in detail.  Medication risks, benefits, costs, interactions, and alternatives were discussed as indicated.  I advised her to contact the office if her condition worsens, changes or fails to improve as anticipated. She expressed understanding with the diagnosis(es) and plan.       Alyssa [name] is a 39 y.o. female who was evaluated by a video visit encounter for concerns as above. Patient identification was verified prior to start of the visit. A caregiver was present when appropriate. Due to this being a Scientist, physiological (During IONGE-95 public health emergency), evaluation of the following organ systems was limited: Vitals/Constitutional/EENT/Resp/CV/GI/GU/MS/Neuro/Skin/Heme-Lymph-Imm.  Pursuant to the emergency declaration under the [place], 1135 waiver authority and the [employer] Act, this Virtual  Visit was conducted, with patient's (and/or legal guardian's) consent, to reduce the patient's risk of exposure to COVID-19 and provide necessary medical care.     Services were provided through a video synchronous discussion virtually to substitute for in-person clinic visit.   Patient and provider were  located at their individual homes.      Doneen Poisson, MD  [place]  01/06/20 3:04 PM     Portions of this note may have been populated using smart dictation software and may have "sounds-like" errors present.

## 2020-01-06 NOTE — Progress Notes (Signed)
Attempt to reach patient for follow up. Discreet VM left with contact information. Per chart review, patient has appointment with Dr. Aleene Davidson today at 3:00 pm.

## 2020-01-15 DIAGNOSIS — R55 Syncope and collapse: Secondary | ICD-10-CM

## 2020-01-15 NOTE — ED Notes (Signed)
Pt states that she was mopping the floor when she suddenly felt hot, lost consciousness for unknown period of time.  Denies cp or sob.  Hypotensive for EMS. BP wnl at this time.

## 2020-01-16 ENCOUNTER — Inpatient Hospital Stay
Admit: 2020-01-16 | Discharge: 2020-01-16 | Disposition: A | Discharge status: Home / Self Care | Payer: PRIVATE HEALTH INSURANCE | Attending: Emergency Medicine

## 2020-01-16 ENCOUNTER — Emergency Department: Admit: 2020-01-16 | Payer: PRIVATE HEALTH INSURANCE | Primary: Family Medicine

## 2020-01-16 LAB — EKG 12-LEAD
Atrial Rate: 100 {beats}/min
Diagnosis: NORMAL
P Axis: 37 degrees
P-R Interval: 160 ms
Q-T Interval: 366 ms
QRS Duration: 82 ms
QTc Calculation (Bazett): 472 ms
R Axis: 86 degrees
T Axis: 8 degrees
Ventricular Rate: 100 {beats}/min

## 2020-01-16 LAB — CBC WITH AUTO DIFFERENTIAL
Basophils %: 1 % (ref 0–1)
Basophils Absolute: 0.1 10*3/uL (ref 0.0–0.1)
Eosinophils %: 2 % (ref 0–7)
Eosinophils Absolute: 0.1 10*3/uL (ref 0.0–0.4)
Granulocyte Absolute Count: 0.1 10*3/uL — ABNORMAL HIGH (ref 0.00–0.04)
Hematocrit: 37.7 % (ref 35.0–47.0)
Hemoglobin: 12.1 g/dL (ref 11.5–16.0)
Immature Granulocytes: 1 % — ABNORMAL HIGH (ref 0.0–0.5)
Lymphocytes %: 39 % (ref 12–49)
Lymphocytes Absolute: 3.4 10*3/uL (ref 0.8–3.5)
MCH: 26.8 PG (ref 26.0–34.0)
MCHC: 32.1 g/dL (ref 30.0–36.5)
MCV: 83.4 FL (ref 80.0–99.0)
MPV: 11.7 FL (ref 8.9–12.9)
Monocytes %: 6 % (ref 5–13)
Monocytes Absolute: 0.6 10*3/uL (ref 0.0–1.0)
NRBC Absolute: 0 10*3/uL (ref 0.00–0.01)
Neutrophils %: 51 % (ref 32–75)
Neutrophils Absolute: 4.5 10*3/uL (ref 1.8–8.0)
Nucleated RBCs: 0 PER 100 WBC
Platelets: 455 10*3/uL — ABNORMAL HIGH (ref 150–400)
RBC: 4.52 M/uL (ref 3.80–5.20)
RDW: 14.3 % (ref 11.5–14.5)
WBC: 8.7 10*3/uL (ref 3.6–11.0)

## 2020-01-16 LAB — LACTIC ACID
Lactic Acid: 2.4 MMOL/L (ref 0.4–2.0)
Lactic acid: 2.4 MMOL/L — CR (ref 0.4–2.0)

## 2020-01-16 LAB — COMPREHENSIVE METABOLIC PANEL
ALT: 24 U/L (ref 12–78)
AST: 26 U/L (ref 15–37)
Albumin/Globulin Ratio: 0.7 — ABNORMAL LOW (ref 1.1–2.2)
Albumin: 3.3 g/dL — ABNORMAL LOW (ref 3.5–5.0)
Alkaline Phosphatase: 66 U/L (ref 45–117)
Anion Gap: 8 mmol/L (ref 5–15)
BUN: 7 MG/DL (ref 6–20)
Bun/Cre Ratio: 10 — ABNORMAL LOW (ref 12–20)
CO2: 26 mmol/L (ref 21–32)
Calcium: 8.2 MG/DL — ABNORMAL LOW (ref 8.5–10.1)
Chloride: 103 mmol/L (ref 97–108)
Creatinine: 0.71 MG/DL (ref 0.55–1.02)
EGFR IF NonAfrican American: >60 mL/min/{1.73_m2} (ref >60)
GFR African American: >60 mL/min/{1.73_m2} (ref >60)
Globulin: 4.9 g/dL — ABNORMAL HIGH (ref 2.0–4.0)
Glucose: 183 mg/dL — ABNORMAL HIGH (ref 65–100)
Potassium: 4.1 mmol/L (ref 3.5–5.1)
Sodium: 137 mmol/L (ref 136–145)
Total Bilirubin: 0.5 MG/DL (ref 0.2–1.0)
Total Protein: 8.2 g/dL (ref 6.4–8.2)

## 2020-01-16 LAB — MAGNESIUM
Magnesium: 1.7 mg/dL (ref 1.6–2.4)
Magnesium: 1.7 mg/dL (ref 1.6–2.4)

## 2020-01-16 LAB — PROBNP, N-TERMINAL: BNP: <5 PG/ML (ref <125)

## 2020-01-16 LAB — COVID-19, RAPID: SARS-CoV-2, Rapid: NOT DETECTED

## 2020-01-16 LAB — TROPONIN: Troponin I: <0.05 ng/mL (ref <0.05)

## 2020-01-16 LAB — METABOLIC PANEL, COMPREHENSIVE
A-G Ratio: 0.7 — ABNORMAL LOW (ref 1.1–2.2)
ALT (SGPT): 24 U/L (ref 12–78)
AST (SGOT): 26 U/L (ref 15–37)
Albumin: 3.3 g/dL — ABNORMAL LOW (ref 3.5–5.0)
Alk. phosphatase: 66 U/L (ref 45–117)
Anion gap: 8 mmol/L (ref 5–15)
BUN/Creatinine ratio: 10 — ABNORMAL LOW (ref 12–20)
BUN: 7 MG/DL (ref 6–20)
Bilirubin, total: 0.5 MG/DL (ref 0.2–1.0)
CO2: 26 mmol/L (ref 21–32)
Calcium: 8.2 MG/DL — ABNORMAL LOW (ref 8.5–10.1)
Chloride: 103 mmol/L (ref 97–108)
Creatinine: 0.71 MG/DL (ref 0.55–1.02)
GFR est AA: >60 mL/min/{1.73_m2} (ref >60)
GFR est non-AA: >60 mL/min/{1.73_m2} (ref >60)
Globulin: 4.9 g/dL — ABNORMAL HIGH (ref 2.0–4.0)
Glucose: 183 mg/dL — ABNORMAL HIGH (ref 65–100)
Potassium: 4.1 mmol/L (ref 3.5–5.1)
Protein, total: 8.2 g/dL (ref 6.4–8.2)
Sodium: 137 mmol/L (ref 136–145)

## 2020-01-16 LAB — EKG, 12 LEAD, INITIAL
Atrial Rate: 100 {beats}/min
Calculated P Axis: 37 degrees
Calculated R Axis: 86 degrees
Calculated T Axis: 8 degrees
Diagnosis: NORMAL
P-R Interval: 160 ms
Q-T Interval: 366 ms
QRS Duration: 82 ms
QTC Calculation (Bezet): 472 ms
Ventricular Rate: 100 {beats}/min

## 2020-01-16 LAB — COVID-19 RAPID TEST: COVID-19 rapid test: NOT DETECTED

## 2020-01-16 LAB — CBC WITH AUTOMATED DIFF
ABS. BASOPHILS: 0.1 10*3/uL (ref 0.0–0.1)
ABS. EOSINOPHILS: 0.1 10*3/uL (ref 0.0–0.4)
ABS. IMM. GRANS.: 0.1 10*3/uL — ABNORMAL HIGH (ref 0.00–0.04)
ABS. LYMPHOCYTES: 3.4 10*3/uL (ref 0.8–3.5)
ABS. MONOCYTES: 0.6 10*3/uL (ref 0.0–1.0)
ABS. NEUTROPHILS: 4.5 10*3/uL (ref 1.8–8.0)
ABSOLUTE NRBC: 0 10*3/uL (ref 0.00–0.01)
BASOPHILS: 1 % (ref 0–1)
EOSINOPHILS: 2 % (ref 0–7)
HCT: 37.7 % (ref 35.0–47.0)
HGB: 12.1 g/dL (ref 11.5–16.0)
IMMATURE GRANULOCYTES: 1 % — ABNORMAL HIGH (ref 0.0–0.5)
LYMPHOCYTES: 39 % (ref 12–49)
MCH: 26.8 PG (ref 26.0–34.0)
MCHC: 32.1 g/dL (ref 30.0–36.5)
MCV: 83.4 FL (ref 80.0–99.0)
MONOCYTES: 6 % (ref 5–13)
MPV: 11.7 FL (ref 8.9–12.9)
NEUTROPHILS: 51 % (ref 32–75)
NRBC: 0 PER 100 WBC
PLATELET: 455 10*3/uL — ABNORMAL HIGH (ref 150–400)
RBC: 4.52 M/uL (ref 3.80–5.20)
RDW: 14.3 % (ref 11.5–14.5)
WBC: 8.7 10*3/uL (ref 3.6–11.0)

## 2020-01-16 LAB — TROPONIN I: Troponin-I, Qt.: <0.05 ng/mL (ref <0.05)

## 2020-01-16 LAB — SAMPLES BEING HELD

## 2020-01-16 LAB — NT-PRO BNP: NT pro-BNP: <5 PG/ML (ref <125)

## 2020-01-16 MED ORDER — SODIUM CHLORIDE 0.9% BOLUS IV
0.9 % | Freq: Once | INTRAVENOUS | Status: AC
Start: 2020-01-16 — End: 2020-01-16
  Administered 2020-01-16: 05:00:00 via INTRAVENOUS

## 2020-01-16 MED FILL — SODIUM CHLORIDE 0.9 % IV: INTRAVENOUS | Qty: 1000

## 2020-01-16 NOTE — ED Notes (Signed)
Patient discharged from ED by provider. Discharge instructions reviewed with patient and all questions answered. Patient {Ambulatory from ED in NAD.
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
## 2020-01-17 NOTE — Progress Notes (Signed)
Covering for Syliva Overman, RN [facility]  Telephonic outreach attempt to follow up after ED visit on 9/29.  Patient in ED due symptoms of syncope, dehydration, elevated lactic acid levels, and anxiety.   Left a discreet VM providing contact information and offering assistance with current needs.

## 2020-01-27 NOTE — Progress Notes (Signed)
 Follow up phone call to patient, two pt identifiers verified. Discussed patient's goals:   Goals     . Completes all follow-up appointments      . Educate and encourage importance of FU for prevention of complications or disease;  . Assess the patient's relationship with a PCP and next FU visit scheduled;  o States she will call on Monday to make her FU appt.  . Discuss importance of adherence to treatment plan and follow up visits;  . Identify any barriers in transportation or access to FU appointments.   . Assist patient with making FU appointments as needed;   . It's also a good idea to know your test results.   [name] Keep a list of the medicines you take.  7/20: Patient states she did get an appointment to see her PCP.  Has cardio appt on 8/6.  7/30: Follow up - Cardio 8/6.  Did not see her PCP because her chest pain ended up being muscular.  8/20: Follow completed.  Now seeing a chiropractor for back pain.  9/24: PCP in six weeks.    11/19: She has not made a f/up yet because of covid.  Has an elderly aunt who lives with her and does not want to expose any of her family members.  Encouraged her to continue to follow healthy diet.  12/17: States doing well with diet.  Keeping f/up visits.  1/14: had follow up with her PCP yesterday, going to get labs done on 1/15.  Will make a follow up appointment on 1/19.    3/12: Has f/up with Dr. Olita on 4/27.  4/9: F/up with Dr. Olita on 4/27.  HH application to be submitted by 4/25 to be eligible for dexcom on 5/1.  5/7: Had apt with PCP, 4/28.  Pharmacy has been in contact with patient to work on diabetes program, scheduled for 5/12.  6/10: Has not gotten labs done.  Saturday, second covid shot.  Discussed [facility] and Be Well w/ Diabetes program.  She is enrolled but needs to call HH back.  States she will after we hang up.  7/8: Patient had f/up with her PCP on 6/30, A1C was down to 8.4 (10.2 in January).  Next appointment is in September.      . Demonstrates  self-management strategies and behaviors for a healthy lifestyle      . Verbalizes importance of a healthy lifestyle and impacts it has on health  o can lower risks for serious health problems such as high blood pressure, heart disease and diabetes;  o can prevent complications or exacerbations of chronic health conditions like Diabetes;  - Has been on decreased carb diet, A1C down to 9.5 from 12.7 in about 6 months  . Recognizes importance of completing follow up appointments with PCP and specialists;  [name] Discussed healthy diet options   o generally: protein source (deck of cards = 3 ounces), five servings of fruits and vegetables (a piece of fruit or cup of vegetables) with every meal;  . Sleep 7-8 hours per night;  [name] [place] importance of exercise as part of a daily routine   8/20: She hurt her back at work.  Did not file worker's comp claim because she didn't want to be told she would have to do covid testing to come back to work.  She is seeing a chiropractor regularly and is icing for 12 minutes every three to four.  9/24: Patient described meals she is eating - high  protein, low carbs - Breakfast - eggs, cheese and shrimp or salmon, healthy snacks for lunch, dinner ground beef w/ cabbage and potatoes.  Drinking water  excessively, average 130 ml/day.  Her provider is aware.  Plans to go to her PCP for A1C check in about 6 weeks.  10/8: States she had salmon and eggs this morning for breakfast. States diet has been going well.  Leaves to go to [place], [place] on 10/16 to visit family.  Encouraged her to plan meals.    11/19: Patient states she just had an eye appointment and eyes are healthy.  No effects of diabetes.  She is still maintaining a healthy diet.  States she is eating egg and shrimp for breakfast.  States her son is becoming engaged in better diet.  12/17: Diet remains on track.  Likes my calls for accountability.  Asked me to keep calling until March.  Provided support and encouragement.    1/14:  States she had a spell of eating bad. Had shrimp and eggs for breakfast, dinner [place] - [place] provided support and encouragement.  2/12: Eating salmon and eggs again.  Kids at home, going back to school in September.  Sleeping 6-7 hours a night. Migraine for last two days, now dull headache. Has not taken her Excedrin today, took last two days. Still doing good with water  intake.   3/12: Patient states she is doing well.  No issues or concerns.  4/9: Doing well.  Patient to complete Be Well with Diabetes enrollment.  Walked her through how to get to the Be Well platform through [facility] central.  5/7: Will get labs drawn on Monday, 5/10 for A1C.    6/10: Just moved.  Juggling house and work.  Trouble with eating right due to stress.  Was rushed.  Still good with drinking water .  [employer].  Feels she is in a good place to do better with diet.  Eating salmon and salads. Chicken breast, using ranch seasoning.    7/8: Patient feels her stress level is much better, she is eating better, as well.  8/26: Stress is better. A1C was down to 8.4 (from 10.2).    10/11: Glucose in AM is 189, not eating right.  Has been as low as 110, when eating properly.  Encouraged her to minimize stress levels.              Patient's primary care provider relationship reviewed with patient and modified, as applicable.  Readiness to Change: []   Pre-contemplative    []   Contemplative  []   Preparation               [x]   Action                  []   Maintenance    Barriers/Challenges to Care: []   Decline in memory    []   Language barrier     []   Emotional                  []   Limited mobility  []   Lack of motivation     []  Vision, hearing or cognitive impairment []   Knowledge []  Financial Barriers []   Lack of support  []   Pain []   Other [x]   None    Key pt activities to achieve better health:   [x]   Weight loss  [x]   Improved diabetic control  []   Decreased cholesterol levels  []   Decreased blood pressure  []     []   Plan for next  call: Call in four weeks, 11/8.

## 2020-02-10 ENCOUNTER — Ambulatory Visit: Attending: Obstetrics & Gynecology | Primary: Family Medicine

## 2020-02-10 ENCOUNTER — Ambulatory Visit
Admit: 2020-02-10 | Discharge: 2020-02-10 | Payer: PRIVATE HEALTH INSURANCE | Attending: Obstetrics & Gynecology | Primary: Family Medicine

## 2020-02-10 DIAGNOSIS — N760 Acute vaginitis: Secondary | ICD-10-CM

## 2020-02-10 MED ORDER — FLUCONAZOLE 200 MG TAB
200 mg | ORAL_TABLET | ORAL | 1 refills | Status: AC
Start: 2020-02-10 — End: 2020-02-15
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
## 2020-02-14 LAB — NUSWAB VAGINOSIS + CANDIDA
C. albicans, NAA: POSITIVE — AB
C. glabrata, NAA: NEGATIVE
Candida albicans, NAA: POSITIVE — AB
Candida glabrata by PCR: NEGATIVE

## 2020-02-24 NOTE — Progress Notes (Signed)
Follow up phone call to patient, two pt identifiers verified. Discussed patient's goals:   Goals     . Completes all follow-up appointments      . Educate and encourage importance of FU for prevention of complications or disease;  . Assess the patient's relationship with a PCP and next FU visit scheduled;  o States she will call on Monday to make her FU appt.  . Discuss importance of adherence to treatment plan and follow up visits;  . Identify any barriers in transportation or access to FU appointments.   . Assist patient with making FU appointments as needed;   . It's also a good idea to know your test results.   Marland Kitchen Keep a list of the medicines you take.  7/20: Patient states she did get an appointment to see her PCP.  Has cardio appt on 8/6.  7/30: Follow up - Cardio 8/6.  Did not see her PCP because her chest pain ended up being muscular.  8/20: Follow completed.  Now seeing a chiropractor for back pain.  9/24: PCP in six weeks.    11/19: She has not made a f/up yet because of covid.  Has an elderly aunt who lives with her and does not want to expose any of her family members.  Encouraged her to continue to follow healthy diet.  12/17: States doing well with diet.  Keeping f/up visits.  1/14: had follow up with her PCP yesterday, going to get labs done on 1/15.  Will make a follow up appointment on 1/19.    3/12: Has f/up with Dr. Madelin Rear on 4/27.  4/9: F/up with Dr. Madelin Rear on 4/27.  HH application to be submitted by 4/25 to be eligible for dexcom on 5/1.  5/7: Had apt with PCP, 4/28.  Pharmacy has been in contact with patient to work on diabetes program, scheduled for 5/12.  6/10: Has not gotten labs done.  Saturday, second covid shot.  Discussed [facility] and Be Well w/ Diabetes program.  She is enrolled but needs to call HH back.  States she will after we hang up.  7/8: Patient had f/up with her PCP on 6/30, A1C was down to 8.4 (10.2 in January).  Next appointment is in September.  11/8:       .  Demonstrates self-management strategies and behaviors for a healthy lifestyle      . Verbalizes importance of a healthy lifestyle and impacts it has on health  o can lower risks for serious health problems such as high blood pressure, heart disease and diabetes;  o can prevent complications or exacerbations of chronic health conditions like Diabetes;  - Has been on decreased carb diet, A1C down to 9.5 from 12.7 in about 6 months  . Recognizes importance of completing follow up appointments with PCP and specialists;  Marland Kitchen Discussed healthy diet options   o generally: protein source (deck of cards = 3 ounces), five servings of fruits and vegetables (a piece of fruit or cup of vegetables) with every meal;  . Sleep 7-8 hours per night;  Trenton Gammon importance of exercise as part of a daily routine   8/20: She hurt her back at work.  Did not file worker's comp claim because she didn't want to be told she would have to do covid testing to come back to work.  She is seeing a chiropractor regularly and is icing for 12 minutes every three to four.  9/24: Patient described meals she is  eating - high protein, low carbs - Breakfast - eggs, cheese and shrimp or salmon, healthy snacks for lunch, dinner ground beef w/ cabbage and potatoes.  Drinking water excessively, average 130 ml/day.  Her provider is aware.  Plans to go to her PCP for A1C check in about 6 weeks.  10/8: States she had salmon and eggs this morning for breakfast. States diet has been going well.  Leaves to go to [place], [place] on 10/16 to visit family.  Encouraged her to plan meals.    11/19: Patient states she just had an eye appointment and eyes are healthy.  No effects of diabetes.  She is still maintaining a healthy diet.  States she is eating egg and shrimp for breakfast.  States her son is becoming engaged in better diet.  12/17: Diet remains on track.  Likes my calls for accountability.  Asked me to keep calling until March.  Provided support and  encouragement.    1/14: States she had a spell of eating bad. Had shrimp and eggs for breakfast, dinner [place] provided support and encouragement.  2/12: Eating salmon and eggs again.  Kids at home, going back to school in September.  Sleeping 6-7 hours a night. Migraine for last two days, now dull headache. Has not taken her Excedrin today, took last two days. Still doing good with water intake.   3/12: Patient states she is doing well.  No issues or concerns.  4/9: Doing well.  Patient to complete Be Well with Diabetes enrollment.  Walked her through how to get to the Be Well platform through [facility] central.  5/7: Will get labs drawn on Monday, 5/10 for A1C.    6/10: Just moved.  Juggling house and work.  Trouble with eating right due to stress.  Was rushed.  Still good with drinking water.  Nicknamed "[place]".  Feels she is in a good place to do better with diet.  Eating salmon and salads. Chicken breast, using ranch seasoning.    7/8: Patient feels her stress level is much better, she is eating better, as well.  8/26: Stress is better. A1C was down to 8.4 (from 10.2).    10/11: Glucose in AM is 189, not eating right.  Has been as low as 110, when eating properly.  Encouraged her to minimize stress levels.    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  Encouraged her to try to get plenty of rest and eat healthy.  Patient has support of her children, help with cooking dinner.            Patient's primary care provider relationship reviewed with patient and modified, as applicable.    Readiness to Change: []   Pre-contemplative    []   Contemplative  []   Preparation               [x]   Action                  []   Maintenance    Barriers/Challenges to Care: []   Decline in memory    []   Language barrier     []   Emotional                  []   Limited mobility  []   Lack of motivation     []  Vision, hearing or cognitive impairment []   Knowledge  []  Financial Barriers []   Lack of  support  []   Pain []   Other [x]   None    Key pt activities to achieve better health:   [x]   Weight loss  [x]   Improved diabetic control  []   Decreased cholesterol levels  []   Decreased blood pressure  []     []     Plan for next call: Call in one month, 12/6.

## 2020-03-26 MED FILL — TRULICITY 0.75MG/0.5ML PEN: 0.75 MG/0.5ML | SUBCUTANEOUS | 84 days supply | Qty: 6 | Fill #1 | Status: AC

## 2020-03-26 MED FILL — METFORMIN HCL ER 500MG TB24: 500 mg | 90 days supply | Qty: 360 | Fill #1 | Status: AC

## 2020-03-26 MED FILL — LANTUS SOLOSTAR 100UNIT/ML (15ML=1BOX): 100 [IU]/mL | 90 days supply | Qty: 75 | Fill #1 | Status: AC

## 2020-03-27 ENCOUNTER — Ambulatory Visit: Attending: Family Medicine | Primary: Family Medicine

## 2020-03-27 ENCOUNTER — Encounter

## 2020-03-27 ENCOUNTER — Ambulatory Visit
Admit: 2020-03-27 | Discharge: 2020-03-27 | Payer: PRIVATE HEALTH INSURANCE | Attending: Family Medicine | Primary: Family Medicine

## 2020-03-27 DIAGNOSIS — E1165 Type 2 diabetes mellitus with hyperglycemia: Secondary | ICD-10-CM

## 2020-03-27 LAB — COMPREHENSIVE METABOLIC PANEL
ALT: 29 U/L (ref 12–78)
AST: 21 U/L (ref 15–37)
Albumin/Globulin Ratio: 0.8 — ABNORMAL LOW (ref 1.1–2.2)
Albumin: 3.6 g/dL (ref 3.5–5.0)
Alkaline Phosphatase: 68 U/L (ref 45–117)
Anion Gap: 6 mmol/L (ref 5–15)
BUN/Creatinine Ratio: 15 (ref 12–20)
BUN: 10 MG/DL (ref 6–20)
CO2: 29 mmol/L (ref 21–32)
Calcium: 9.5 MG/DL (ref 8.5–10.1)
Chloride: 101 mmol/L (ref 97–108)
Creatinine: 0.68 MG/DL (ref 0.55–1.02)
GFR African American: >60 mL/min/{1.73_m2} (ref >60)
Globulin: 4.3 g/dL — ABNORMAL HIGH (ref 2.0–4.0)
Glucose: 258 mg/dL — ABNORMAL HIGH (ref 65–100)
Potassium: 4.4 mmol/L (ref 3.5–5.1)
Sodium: 136 mmol/L (ref 136–145)
Total Bilirubin: 0.4 MG/DL (ref 0.2–1.0)
Total Protein: 7.9 g/dL (ref 6.4–8.2)
eGFR NON-AA: >60 mL/min/{1.73_m2} (ref >60)

## 2020-03-27 LAB — MICROALBUMIN / CREATININE URINE RATIO
Creatinine, Ur: 103 mg/dL
Microalb, Ur: 3.54 MG/DL
Microalb/Creat Ratio: 34 mg/g — ABNORMAL HIGH (ref 0–30)

## 2020-03-27 LAB — LIPID PANEL
CHOL/HDL Ratio: 2.4 (ref 0.0–5.0)
Chol/HDL Ratio: 2.4 (ref 0.0–5.0)
Cholesterol, Total: 117 MG/DL (ref <200)
Cholesterol, total: 117 MG/DL (ref <200)
HDL Cholesterol: 49 MG/DL
HDL: 49 MG/DL
LDL Calculated: 42.6 MG/DL (ref 0–100)
LDL, calculated: 42.6 MG/DL (ref 0–100)
Triglyceride: 127 MG/DL (ref <150)
Triglycerides: 127 MG/DL (ref <150)
VLDL Cholesterol Calculated: 25.4 MG/DL
VLDL, calculated: 25.4 MG/DL

## 2020-03-27 LAB — CBC
Hematocrit: 39.4 % (ref 35.0–47.0)
Hemoglobin: 11.9 g/dL (ref 11.5–16.0)
MCH: 25.5 PG — ABNORMAL LOW (ref 26.0–34.0)
MCHC: 30.2 g/dL (ref 30.0–36.5)
MCV: 84.5 FL (ref 80.0–99.0)
MPV: 11.9 FL (ref 8.9–12.9)
NRBC Absolute: 0 10*3/uL (ref 0.00–0.01)
Nucleated RBCs: 0 PER 100 WBC
Platelets: 405 10*3/uL — ABNORMAL HIGH (ref 150–400)
RBC: 4.66 M/uL (ref 3.80–5.20)
RDW: 14.4 % (ref 11.5–14.5)
WBC: 8.6 10*3/uL (ref 3.6–11.0)

## 2020-03-27 LAB — HEMOGLOBIN A1C W/EAG
Estimated Avg Glucose: 275 mg/dL
Hemoglobin A1C: 11.2 % — ABNORMAL HIGH (ref 4.0–5.6)

## 2020-03-27 LAB — CBC W/O DIFF
ABSOLUTE NRBC: 0 10*3/uL (ref 0.00–0.01)
HCT: 39.4 % (ref 35.0–47.0)
HGB: 11.9 g/dL (ref 11.5–16.0)
MCH: 25.5 PG — ABNORMAL LOW (ref 26.0–34.0)
MCHC: 30.2 g/dL (ref 30.0–36.5)
MCV: 84.5 FL (ref 80.0–99.0)
MPV: 11.9 FL (ref 8.9–12.9)
NRBC: 0 PER 100 WBC
PLATELET: 405 10*3/uL — ABNORMAL HIGH (ref 150–400)
RBC: 4.66 M/uL (ref 3.80–5.20)
RDW: 14.4 % (ref 11.5–14.5)
WBC: 8.6 10*3/uL (ref 3.6–11.0)

## 2020-03-27 LAB — MICROALBUMIN, UR, RAND W/ MICROALB/CREAT RATIO
Creatinine, urine random: 103 mg/dL
Microalbumin,urine random: 3.54 MG/DL
Microalbumin/Creat ratio (mg/g creat): 34 mg/g — ABNORMAL HIGH (ref 0–30)

## 2020-03-27 LAB — METABOLIC PANEL, COMPREHENSIVE
A-G Ratio: 0.8 — ABNORMAL LOW (ref 1.1–2.2)
ALT (SGPT): 29 U/L (ref 12–78)
AST (SGOT): 21 U/L (ref 15–37)
Albumin: 3.6 g/dL (ref 3.5–5.0)
Alk. phosphatase: 68 U/L (ref 45–117)
Anion gap: 6 mmol/L (ref 5–15)
BUN/Creatinine ratio: 15 (ref 12–20)
BUN: 10 MG/DL (ref 6–20)
Bilirubin, total: 0.4 MG/DL (ref 0.2–1.0)
CO2: 29 mmol/L (ref 21–32)
Calcium: 9.5 MG/DL (ref 8.5–10.1)
Chloride: 101 mmol/L (ref 97–108)
Creatinine: 0.68 MG/DL (ref 0.55–1.02)
GFR est AA: >60 mL/min/{1.73_m2} (ref >60)
GFR est non-AA: >60 mL/min/{1.73_m2} (ref >60)
Globulin: 4.3 g/dL — ABNORMAL HIGH (ref 2.0–4.0)
Glucose: 258 mg/dL — ABNORMAL HIGH (ref 65–100)
Potassium: 4.4 mmol/L (ref 3.5–5.1)
Protein, total: 7.9 g/dL (ref 6.4–8.2)
Sodium: 136 mmol/L (ref 136–145)

## 2020-03-27 LAB — HEMOGLOBIN A1C WITH EAG
Est. average glucose: 275 mg/dL
Hemoglobin A1c: 11.2 % — ABNORMAL HIGH (ref 4.0–5.6)

## 2020-03-27 MED ORDER — TRULICITY 3 MG/0.5 ML SUBCUTANEOUS PEN INJECTOR
3 mg/0.5 mL | SUBCUTANEOUS | 0 refills | Status: DC
Start: 2020-03-27 — End: 2020-05-26
  Filled 2020-05-05: qty 2, 28d supply, fill #0

## 2020-03-27 MED ORDER — TRULICITY 4.5 MG/0.5 ML SUBCUTANEOUS PEN INJECTOR
4.5 mg/0.5 mL | SUBCUTANEOUS | 3 refills | Status: DC
Start: 2020-03-27 — End: 2020-05-26

## 2020-03-27 MED ORDER — TRULICITY 1.5 MG/0.5 ML SUBCUTANEOUS PEN INJECTOR
1.5 mg/0.5 mL | SUBCUTANEOUS | 0 refills | Status: DC
Start: 2020-03-27 — End: 2020-06-23

## 2020-03-27 NOTE — Progress Notes (Signed)
Attempt to reach patient for follow up. Discreet VM left with contact information.     Per chart review, patient had f/up with her PCP for uncontrolled diabetes.  Patient's A1C was up to 11.2, proivder has increased dose of trulicity.  Encouraged patient to do regular glucose checks, states she did not qualify for a CGM.  Added educational materials to patient's chart regarding diabetic diet and carb counting.    Sent a message to Mora Bellman, [facility] Pharmacist, to ask what the criteria is to meet qualification.

## 2020-03-27 NOTE — Telephone Encounter (Signed)
[place] and spoke with Vicente Males.     She has been advised and states understanding of this.
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
## 2020-03-27 NOTE — Progress Notes (Signed)
 Chief Complaint   Patient presents with   . Abdominal Pain     aroubd area where she had hernia repair- 05/10 on pain scale when sitting up x49months    . Knee Pain     right knee/has been using icy hot- hurts when bending knee

## 2020-03-27 NOTE — Telephone Encounter (Signed)
 POPULATION HEALTH CLINICAL PHARMACY REVIEW - BE WELL WITH DIABETES: HIGH A1C  =============================================  Alyssa [name] is a 39 y.o. female enrolled in the [facility] Be Well with Diabetes program.    Identified care gap(s): A1c > 11%    DM Program Prescriptions:  Current Outpatient Medications   Medication Sig Dispense Refill   . dulaglutide  (Trulicity ) 1.5 mg/0.5 mL sub-q pen 0.5 mL by SubCUTAneous route every seven (7) days. 4 Each 0   . [START ON 04/27/2020] dulaglutide  (Trulicity ) 3 mg/0.5 mL pnij 3 mg by SubCUTAneous route every seven (7) days. 4 Each 0   . [START ON 05/28/2020] dulaglutide  (Trulicity ) 4.5 mg/0.5 mL pnij 4.5 mg by SubCUTAneous route every seven (7) days. 4 Each 3   . valACYclovir  (VALTREX ) 500 mg tablet Take 1 Tablet by mouth two (2) times a day. 90 Tablet 1   . rosuvastatin  (CRESTOR ) 10 mg tablet TAKE ONE TABLET BY MOUTH NIGHTLY 90 Tablet 0   . metFORMIN  ER (GLUCOPHAGE  XR) 500 mg tablet TAKE TWO TABLETS BY MOUTH EVERY DAY WITH DINNER 180 Tablet 0   . lisinopriL  (PRINIVIL , ZESTRIL ) 10 mg tablet TAKE 1 TABLET BY MOUTH ONE TIME A DAY 90 Tablet 0   . Insulin  Needles, Disposable, (Comfort EZ Pen Needles) 31 gauge x 5/16 ndle [employer] brand pen needle to use once a day with basal insulin  100 Package 3   . Blood-Glucose Meter ([employer] 2 Meter kit) monitoring kit Use as directed to test blood sugars 1 Kit 0   . glucose blood VI test strips [facility] Jazz) strip Use to test blood sugars one time daily or as directed 100 Strip 3   . Blood Glucose Cntl Hi & Normal (AgaMatrix Control Norm-Hi) soln Use to calibrate agamatrix meter 1 Each 5   . lancets misc Use to test blood sugars once a day as directed 100 Each 3   . fluticasone  propionate (Flonase  Allergy Relief) 50 mcg/actuation nasal spray 2 Sprays by Both Nostrils route daily as needed for Allergies. (Patient not taking: Reported on 03/27/2020)     . ibuprofen  (MOTRIN ) 800 mg tablet Take 1 Tab by mouth every six  (6) hours as needed for Pain. Indications: minor musculoskeletal injury, pain 30 Tab 0   . Blood-Glucose Meter,Continuous (Dexcom G6 Receiver) misc Use per package instructions e 11.9 (Patient not taking: Reported on 03/27/2020) 1 Each 5   . Blood-Glucose Sensor (Dexcom G6 Sensor) devi Use per package instruction e11.9 (Patient not taking: Reported on 03/27/2020) 10 Device 2   . Blood-Glucose Transmitter (Dexcom G6 Transmitter) devi Use per package instructio e11.9 (Patient not taking: Reported on 03/27/2020) 1 Device 5   . insulin  glargine (Lantus  Solostar U-100 Insulin ) 100 unit/mL (3 mL) inpn INJECT 80 UNITS UNDER THE SKIN AT BEDTIME 15 Pen 5   . flash glucose scanning reader (FreeStyle Libre 14 Day Reader) misc Check bg regularly, use per package instructions dx: insulin  dependent diabetes with history of hypoglycemia (Patient not taking: Reported on 03/27/2020) 1 Each 11   . flash glucose sensor (FreeStyle Libre 14 Day Sensor) kit Check bg regularly use per package instructions: dx insulin  dependent diabetes with history of hypoglycemia (Patient not taking: Reported on 03/27/2020) 1 Kit 11        Allergies:  Allergies   Allergen Reactions   . Betadine [Povidone-Iodine] Hives and Rash   . Codeine Nausea and Vomiting   . Curry Leaf-Tree Hives   . Sulfa (Sulfonamide Antibiotics) Rash   .  Tramadol Itching        Labs:  Lab Results   Component Value Date/Time    Hemoglobin A1c 11.2 (H) 03/27/2020 09:03 AM    Hemoglobin A1c 8.4 (H) 10/14/2019 08:50 AM    Hemoglobin A1c 10.2 (H) 05/13/2019 08:26 AM     Estimated Creatinine Clearance: 140.3 mL/min (by C-G formula based on SCr of 0.68 mg/dL).    Care Team:   - Physician (PCP): 03/27/20, [place]  - ACC/RD: [place] (Last Encounter: today)  - Upcoming appointments:   Future Appointments   Date Time Provider Department Center   06/30/2020 10:30 AM Dillon, Caitlin K, MD [facility]       Refill History:   lantus - 5/25 for 56 ds, 8/19 for 90 ds, 12/9 for 90 ds  Last filled pen  needles 6/30 for #100- make sure patient is not reusing needles  Last filled lisinopril  12/05/19  Metformin  5/25, 8/20, 12/8  Last filled rosuvastatin  8/19  trulicity  8/19, 9/14, 12/9    Diabetes Care:  - Glycemic Goal: <7.0% and directed by provider. Is not at blood glucose goal is worsening. pcp notes states patient has gained 25 pounds recently as well   - Appropriateness of Insulin  Therapy: managed by pcp who states not comfortable titrating if patient not checking sugars consistently. Consider CGM- pcp has written scripts for this. Consider endo?  - Therapy Optimization: I received notification from ACM regarding patient's increase in A1c and CGM not being covered. Patient could benefit from CGM and I would like to discuss the cost of dexcom vs libre with patient  - Medication changes since last A1c: trulicity  was increased. Per pcp notes, pcp does not feel comfortable titrating insulin  without consistent checking of sugars.   - Medication Adherence Assessment: check on lisinopril  and rosuvastatin     Plan:    Discuss weight gain- per pcp notes 25 pounds recently  Discuss libre and dexcom and cost of each- I have requested PA for libre 2- need to discuss cost with patient first before filling bc may go over $600 in a year if fills all year  What dose of trulicity  is she taking- any SE?  Why does patient feel her A1c went from 8 to 11?     Attempt made to reach patient by telephone for diabetes review. Left voice message for patient to return clinician's phone call to dept. Will continue to attempt to contact patient by telephone as appropriate. Would like to review: see above     [name], PharmD, BCPS  Population Health Pharmacy  [facility] Clinical Pharmacist  Department: [phone]

## 2020-03-27 NOTE — Telephone Encounter (Signed)
Yep please ask her to review the dates on the scripts--I dated start dates on all of them  We are increasing the dose  Pt is going to go up to 1.5 for a month, then 3 for a month, then 4.5  She is uncontrolled

## 2020-03-27 NOTE — Telephone Encounter (Signed)
[facility] pharmacist requesting call back with clarification for 3 different prescriptions received for Trulicity for pt on the same day and notes pt just filled prescription yesterday for Trulicity 1.27 mg written in September.    Please call to verify dosing instructions at [phone]. Ldm

## 2020-03-27 NOTE — Progress Notes (Signed)
Received message back from Huntley Dec, Teacher, early years/pre for [employer] [facility] [employer].  She states she would likely be able to get patient approved for the CGM.  States that the Josephine Igo is $130 for 90 days and the Dexcom is $240 for 90 days.  She left a message for patient to reach out to her to attempt to get this set up for patient.  She has asked me to have patient call her, when I reach her.
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
## 2020-04-06 NOTE — Progress Notes (Signed)
Attempt to reach patient for follow up. Discreet VM left with contact information.   Will attempt outreach again in two weeks, 1/3.

## 2020-04-14 NOTE — Telephone Encounter (Signed)
Attempted to contact patient to follow up on referral to weight loss. No answer. Left message.
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
## 2020-04-20 NOTE — Telephone Encounter (Signed)
 Reached patient who was still at work and wanted a return call tomorrow on 04/22/19.     Patient states she will have time to do pharmacist appt tomorrow so will do her annual pharmacist visit and discuss high A1c during this call.     [name], PharmD, BCPS  [facility]  [facility] Clinical Pharmacist  Department: [phone]    For Pharmacy Admin Tracking Only    . Time Spent (min): 15

## 2020-04-21 NOTE — Telephone Encounter (Signed)
 POPULATION HEALTH CLINICAL PHARMACY REVIEW - Be Well with Diabetes    Alyssa [name] is a 40 y.o. female enrolled in the [facility] Employee Diabetes Program. Patient provided Clinical research associate with verbal consent to remain in the program for this year. Patient enrolled 09/17/19.    Medications:   Current Outpatient Medications   Medication Sig   . dulaglutide  (Trulicity ) 1.5 mg/0.5 mL sub-q pen 0.5 mL by SubCUTAneous route every seven (7) days.   is going to start taking 3 mg   . [START ON 04/27/2020] dulaglutide  (Trulicity ) 3 mg/0.5 mL pnij 3 mg by SubCUTAneous route every seven (7) days.  - will do 3 mg for a month   . [START ON 05/28/2020] dulaglutide  (Trulicity ) 4.5 mg/0.5 mL pnij 4.5 mg by SubCUTAneous route every seven (7) days.  - then will start 4.5 mg   . metFORMIN  (GLUMETZA  ER) 500 mg TG24 24 hour tablet Take 1,000 mg by mouth two (2) times daily (with meals).   . valACYclovir  (VALTREX ) 500 mg tablet Take 1 Tablet by mouth two (2) times a day.   . rosuvastatin  (CRESTOR ) 10 mg tablet TAKE ONE TABLET BY MOUTH NIGHTLY   . lisinopriL  (PRINIVIL , ZESTRIL ) 10 mg tablet TAKE 1 TABLET BY MOUTH ONE TIME A DAY   . Insulin  Needles, Disposable, (Comfort EZ Pen Needles) 31 gauge x 5/16 ndle Leader brand pen needle to use once a day with basal insulin    . Blood-Glucose Meter (Jazz Wireless 2 Meter kit) monitoring kit Use as directed to test blood sugars   . glucose blood VI test strips [facility] Jazz) strip Use to test blood sugars one time daily or as directed   . Blood Glucose Cntl Hi & Normal (AgaMatrix Control Norm-Hi) soln Use to calibrate agamatrix meter   . lancets misc Use to test blood sugars once a day as directed   . fluticasone  propionate (Flonase  Allergy Relief) 50 mcg/actuation nasal spray 2 Sprays by Both Nostrils route daily as needed for Allergies. (Patient not taking: Reported on 03/27/2020)   . ibuprofen  (MOTRIN ) 800 mg tablet Take 1 Tab by mouth every six (6) hours as needed for Pain. Indications:  minor musculoskeletal injury, pain   . Blood-Glucose Meter,Continuous (Dexcom G6 Receiver) misc Use per package instructions e 11.9 (Patient not taking: Reported on 03/27/2020)   . Blood-Glucose Sensor (Dexcom G6 Sensor) devi Use per package instruction e11.9 (Patient not taking: Reported on 03/27/2020)   . Blood-Glucose Transmitter (Dexcom G6 Transmitter) devi Use per package instructio e11.9 (Patient not taking: Reported on 03/27/2020)   . insulin  glargine (Lantus  Solostar U-100 Insulin ) 100 unit/mL (3 mL) inpn INJECT 80 UNITS UNDER THE SKIN AT BEDTIME   . 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 flash glucose sensor (FreeStyle Libre 14 Day Sensor) kit Check bg regularly use per package instructions: dx insulin  dependent diabetes with history of hypoglycemia (Patient not taking: Reported on 03/27/2020)     Current Pharmacy: [facility]  Current testing supplies/frequency: Prodigy but wants CGM but was not able to get it covered. Could fight for CGM again, educated patient to have dr file appeal  Pen needles/syringes: na    Allergies:  Allergies   Allergen Reactions   . Betadine [Povidone-Iodine] Hives and Rash   . Codeine Nausea and Vomiting   . Curry Leaf-Tree Hives   .  Sulfa (Sulfonamide Antibiotics) Rash   . Tramadol Itching      Vitals/Labs:  BP Readings from Last 3 Encounters:   03/27/20 132/86   01/16/20 (!) 103/55   01/02/20 (!) 156/85     Lab Results   Component Value Date/Time    Microalbumin/Creat ratio (mg/g creat) 34 (H) 03/27/2020 09:03 AM    Microalbumin,urine random 3.54 03/27/2020 09:03 AM     Lab Results   Component Value Date/Time    Hemoglobin A1c 11.2 (H) 03/27/2020 09:03 AM    Hemoglobin A1c 8.4 (H) 10/14/2019 08:50 AM    Hemoglobin A1c 10.2 (H) 05/13/2019 08:26 AM     Lab Results    Component Value Date/Time    Cholesterol, total 117 03/27/2020 09:03 AM    HDL Cholesterol 49 03/27/2020 09:03 AM    LDL, calculated 42.6 03/27/2020 09:03 AM    VLDL, calculated 25.4 03/27/2020 09:03 AM    Triglyceride 127 03/27/2020 09:03 AM    CHOL/HDL Ratio 2.4 03/27/2020 09:03 AM     ALT (SGPT)   Date Value Ref Range Status   03/27/2020 29 12 - 78 U/L Final     AST (SGOT)   Date Value Ref Range Status   03/27/2020 21 15 - 37 U/L Final     The ASCVD Risk score Verdon DC Jr., et al., 2013) failed to calculate for the following reasons:    The valid total cholesterol range is 130 to 320 mg/dL     Lab Results   Component Value Date/Time    Creatinine 0.68 03/27/2020 09:03 AM     Estimated Creatinine Clearance: 138.9 mL/min (by C-G formula based on SCr of 0.68 mg/dL).    Lab Results   Component Value Date/Time    GFR est non-AA >60 03/27/2020 09:03 AM    GFR est AA >60 03/27/2020 09:03 AM       Immunizations:  Immunization History   Administered Date(s) Administered   . COVID-19, PFIZER, MRNA, LNP-S, PF, 30MCG/0.3ML DOSE 09/07/2019, 09/28/2019   . Influenza Vaccine 01/16/2018, 01/04/2019, 01/11/2020   . Influenza Vaccine (Quad) Mdck Pf (>2 Yrs Flucelvax QUAD [id number]) 01/04/2019   . Influenza Vaccine [employer]) PF (>6 Mo Flulaval, Fluarix, and >3 Yrs Afluria, Fluzone [id number]) 02/01/2017   . Pneumococcal Polysaccharide (PPSV-23) 03/24/2011      Social History:  Social History     Tobacco Use   . Smoking status: Former Smoker     Packs/day: 0.50     Years: 18.00     Pack years: 9.00     Quit date: 06/21/2008     Years since quitting: 11.8   . Smokeless tobacco: Never Used   Substance Use Topics   . Alcohol use: No     ASSESSMENT:  Initial Program Requirements (Y indicates has completed for the year, N indicates needs to be completed by 10/16/2020):  No - Provider Visit for DM (1st)  No - A1c (1st)     Ongoing Program Requirements (Y indicates has completed for the year, N indicates needs to be completed by 04/17/2021):  No -  Provider Visit for DM (2nd)  No - ACC/diabetes educator visit (if A1c over 8%)-  Did have a call with [place] Ferrier on 04/21/19  No - A1c (2nd)  No - Lipid panel  No - Urine microalbumin  Yes - Pneumococcal vaccination: Up to date, not needed again until 63  No - Influenza vaccination for Fall 2022  No - Medication adherence over 70%  Yes - On statin - rosuvastatin   Yes - On ACEi/ARB - lisinopril       Current medications eligible for copay waiver, up to $600, through [facility]:  - trulicity  lantus , lisinopril , metformin , rosuvastatin   - Prodigy     Diabetes Care:   - Glycemic Goal: <7.0% and directed by provider. Is not at blood glucose goal which may be related to changes in diet. Recommend extra attention to diet. Type 2 DM under worsening control as evidenced by a1c>11. States stress and diet are contributing   - Current symptoms/problems include polydipsia  - Home blood sugar records:  fasting range: 295  - Any episodes of hypoglycemia? no  - Known diabetic complications: none  - Eye exam current (within one year): yes  - Foot exam current (within one year): yes  - Appropriateness of Insulin  Therapy: discussed lantus  40 BID instead of 80 daily but patient states she misses doses with BID. Consider toujeo  or tresiba  - Therapy Optimization: addressed adherence and diet, discussed toujeo  or tresiba, is titrating trulicity   - Daily aspirin? No:   - Medication compliance: compliant all of the time, did put alarms on her phone and does take ALL meds in the evening at once to help with compliance  - Diet compliance: compliant most of the time, states is trying to decrease stress bc is an emotional eater but has not changed her diet yet. She does know she needs to change her diet.   - Current exercise: no regular exercise  - What might prevent you from meeting your goal?: lack of support, stress and money       Other Considerations:  - Blood Pressure Goal: BP less than 140/90 mmHg due to  history of DM: Is at blood pressure goal.   - Lipids: Patient has a 10-yr ASCVD risk of <20% with DM and is therefore a candidate for moderate-intensity statin therapy based on updated guidelines.  - Smoking status: former    PLAN:  - Consideration(s) for provider:    Prodigy QID   Is currently titrating trulicity - is doing 1.5 mg, will start 3 mg next week for 1 month, then 4.5 mg    Consider tesiba or toujeo - I have made this recommendation in the past to provider as well   Wants to get herlene 2 covered- told pt to have dr file appeal   Refill for lisinopril , rosuva, and valacyclovir - has no refills  - DM program gaps identified:    Initial requirements: Provider visit with provider for DM (1st) and A1c (1st)    Ongoing requirements: Provider visit for DM (2nd), ACC/diabetes educator visit (if A1c over 8%), A1c (2nd), Lipid panel, Urine microalbumin, Influenza vaccination for 2022-2023 and Medication adherence over 70%   - Education to patient: Discussed general issues about diabetes pathophysiology and management. and Addressed diet and exercise   - Follow up: PCP for identified gaps or as scheduled below  - Upcoming appointments:   Future Appointments   Date Time Provider Department Center   05/12/2020 11:15 AM Gearldean Valery BIRCH, PT [facility] [place]   06/30/2020 10:30 AM Dillon, Caitlin K, MD [facility]       [name], PharmD, BCPS  Population Health Pharmacy  [facility] Clinical Pharmacist  Department: [phone]    For Pharmacy Admin Tracking Only    . CPA in place: No  . Recommendation Provided To: Provider: 4 via Note to Provider   . Intervention Detail: Refill(s)  Provided  . Gap Closed?: Yes  . Intervention Accepted By: Provider: 4  . Time Spent (min): 60

## 2020-04-25 ENCOUNTER — Emergency Department: Admit: 2020-04-25 | Payer: BLUE CROSS/BLUE SHIELD | Primary: Family Medicine

## 2020-04-25 ENCOUNTER — Inpatient Hospital Stay
Admit: 2020-04-25 | Discharge: 2020-04-25 | Disposition: A | Discharge status: Home / Self Care | Payer: BLUE CROSS/BLUE SHIELD | Attending: Student in an Organized Health Care Education/Training Program

## 2020-04-25 DIAGNOSIS — S63501A Unspecified sprain of right wrist, initial encounter: Secondary | ICD-10-CM

## 2020-04-25 MED ORDER — IBUPROFEN 800 MG TAB
800 mg | ORAL | Status: AC
Start: 2020-04-25 — End: 2020-04-25
  Administered 2020-04-25: 16:00:00 via ORAL

## 2020-04-25 MED FILL — IBUPROFEN 800 MG TAB: 800 mg | ORAL | Qty: 1
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
## 2020-04-25 NOTE — ED Notes (Signed)
Pt ambulates to treatment area she states that last night she was attempting to get a bottle of wine open and she did not have a corkscrew so she put a screw in the cork and then pulled on it the screw with a fork.  In the process of pulling on it she hear a pop in the center of her lower hand and wrist.  Increased pain with movement

## 2020-04-25 NOTE — ED Notes (Signed)
Pt given discharge instructions by Dr Crenshaw

## 2020-04-27 ENCOUNTER — Encounter

## 2020-04-27 MED ORDER — VALACYCLOVIR 500 MG TAB
500 mg | ORAL_TABLET | Freq: Two times a day (BID) | ORAL | 0 refills | Status: AC
Start: 2020-04-27 — End: 2021-03-31
  Filled 2020-05-06: qty 180, 90d supply, fill #0

## 2020-04-27 MED ORDER — PRODIGY NO CODING STRIPS
ORAL_STRIP | 3 refills | Status: AC
Start: 2020-04-27 — End: 2021-06-07

## 2020-04-27 MED ORDER — LISINOPRIL 10 MG TAB
10 mg | ORAL_TABLET | ORAL | 0 refills | Status: DC
Start: 2020-04-27 — End: 2020-07-14
  Filled 2020-04-28: qty 90, 90d supply, fill #0

## 2020-04-27 MED ORDER — ROSUVASTATIN 10 MG TAB
10 mg | ORAL_TABLET | Freq: Every evening | ORAL | 0 refills | Status: DC
Start: 2020-04-27 — End: 2020-07-14
  Filled 2020-04-28: qty 90, 90d supply, fill #0

## 2020-04-27 NOTE — Progress Notes (Signed)
ED visit on 04/25/20 - states she tore a ligament in right wrist.  Hurts when picking up things.  Has brace on, given to her by the ED.  She is making call to Dr. Isaac Bliss, ortho surgeon, to get appointment.  Reminded her about calling the nurse access. Informed her the number is on the back of the card.    Red flags:  Call your doctor now or seek immediate medical care if:  Your pain is worse.  You have new or increased swelling in your hand.  You cannot move your hand.  You have tingling, weakness, or numbness in your hand or fingers.  Your hand or fingers are cool or pale or change color.  You have a fever.  Your hand or fingers are red.

## 2020-04-27 NOTE — Telephone Encounter (Signed)
04/26/19, 6:35 am.  Phone call with patient.  Evidently she was opening a jar yesterday when she injured her right hand and heard a pop at the time.  Now the hand is extremely painful.  She is working in the ER and requests an x-ray order.  Advised to see a physician.

## 2020-04-27 NOTE — Telephone Encounter (Signed)
 Dr. Dillon, Caitlin K, MD,    Your patient is currently enrolled in the Be Well with Diabetes program. After your patient's recent visit with a [facility] Clinical Pharmacist, the below were identified as opportunities to assist with their diabetes management:    Patient still interested in freestyle libre 2- will need to file appeal with pharmacy insurance   For now, patient requesting more test strips for glucometer   Pt requesting refills for other meds as well, scripts pended   Also, could consider changing long acting insulin  to tresiba or toujeo  for possible longer acting insulin     See pharmacist note dated 04/21/20 for complete details.     To remain active in the program, the patient is to meet the requirements and documentation must be provided by pre-established deadlines (see below).       Program Requirements  Initial Program Requirements (to be completed by 10/16/2020):   Office visit with provider for DM (1st)   A1c (1st)    Ongoing Program Requirements (to be completed by 04/17/2021):   Office visit with provider for DM (2nd)   A1c (2nd)   Diabetes Education if A1c >8%   Lipid panel   Urine microalbumin    Pneumococcal vaccination (if applicable)   Influenza vaccination for Fall 2022   On statin or contraindication   On ACEi/ARB or contraindication    Thank you,  [name], PharmD, BCPS  Population Health Pharmacy  [facility] Clinical Pharmacist  Department: [phone]

## 2020-04-28 MED FILL — PRODIGY NO CODING BLOOD GLUC  STRP: 90 days supply | Qty: 400 | Fill #0 | Status: AC

## 2020-05-12 ENCOUNTER — Telehealth: Attending: Family Medicine | Primary: Family Medicine

## 2020-05-12 ENCOUNTER — Inpatient Hospital Stay
Admit: 2020-05-12 | Payer: BLUE CROSS/BLUE SHIELD | Attending: Rehabilitative and Restorative Service Providers" | Primary: Family Medicine

## 2020-05-12 DIAGNOSIS — L089 Local infection of the skin and subcutaneous tissue, unspecified: Secondary | ICD-10-CM

## 2020-05-12 DIAGNOSIS — M6208 Separation of muscle (nontraumatic), other site: Secondary | ICD-10-CM
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
## 2020-05-12 NOTE — Progress Notes (Signed)
 Physical Therapy at [facility],   a part of [facility]  [address] [place], [address]  [place], [place]  [address]  Phone: [phone]  Fax: [phone]    Plan of Care/ Statement of Necessity for Physical Therapy Services 2-15    Patient name: Alyssa [name]  DOB: 1980/09/07  Provider#: [id number]  Referral source: Dillon, Caitlin K, MD      Medical/Treatment Diagnosis: Diastasis recti [M62.08]  Pain in right knee [M25.561]     Prior Hospitalization: see medical history     Comorbidities: see eval  Prior Level of Function: see eval  Medications: Verified on Patient Summary List    Start of Care: 05/12/2020      Onset Date:6+ months       The Plan of Care and following information is based on the information from the initial evaluation.  Assessment/ key information: Alyssa [name] presents to our office with LBP, abdominal pain, and right knee pain. The patient's current condition affects her ADLs and work tolerance.  she demonstrates PEC patterning per PRI objective measurements classification.  Her condition is complicated by presence of significant surgical history.  The patient will benefit from skilled PT prior to D/C to address neuromuscular postural influences of functioning in ADL and IADL skills.       Evaluation Complexity History MEDIUM  Complexity : 1-2 comorbidities / personal factors will impact the outcome/ POC ; Examination MEDIUM Complexity : 3 Standardized tests and measures addressing body structure, function, activity limitation and / or participation in recreation  ;Presentation MEDIUM Complexity : Evolving with changing characteristics  ;Clinical Decision Making MEDIUM Complexity : FOTO score of 26-74  Overall Complexity Rating: MEDIUM    Problem List: pain affecting function, decrease ROM, decrease strength, impaired gait/ balance, decrease ADL/ functional abilitiies, decrease activity tolerance and decrease flexibility/ joint mobility   Treatment Plan may include  any combination of the following: Therapeutic exercise, Therapeutic activities, Neuromuscular re-education, Physical agent/modality, Gait/balance training, Manual therapy, Aquatic therapy, Patient education, Self Care training and Functional mobility training  Patient / Family readiness to learn indicated by: asking questions, trying to perform skills and interest  Persons(s) to be included in education: patient (P)  Barriers to Learning/Limitations: None  Patient Goal (s): "to reduce pain"  Patient Self Reported Health Status: fair  Rehabilitation Potential: good    Short Term Goals: To be accomplished in 4 weeks:  To reduce pain to <5/10 so patient can walk up down stairs without difficulty  To decrease swelling by 1 cm so patient can sit to drive without pain  To demonstrate compliance with SIJ protection strategies for transfer in/out car and bed  To decrease overall pain to <5/10 so patient can sleep through the night  To improve TrA and LE strength by 1 ms grade so that patient can tolerate standing to cook/clean for 30 minutes    Long Term Goals: To be accomplished in 12 weeks:  To improve LE to 5/5 strength so that patient can perform all work and recreational activities without pain  Decrease pain <2/10 so patient can walk/stand for 1 hour to perform ADL's and work duties  Improve TrA strength to 5/5 so patient can lift/carry groceries, perform household cleaning and work duties  Improve all special test findings to negative so patient can stand and walk pain free  To increase LE strength to 5/5 without pain so patient can squat to the floor without difficulty  Decrease swelling to minimal so patient can  sleep through night with out pain      Frequency / Duration: Patient to be seen 1-2 times per week for 12 weeks.    Patient/ Caregiver education and instruction: self care, activity modification and exercises    [x]   Plan of care has been reviewed with PTA    Alyssa [name], PT 05/12/2020      ________________________________________________________________________    I certify that the above Therapy Services are being furnished while the patient is under my care. I agree with the treatment plan and certify that this therapy is necessary.    Physician's Signature:____________________  Date:____________Time: _________      Dillon, Caitlin K, MD

## 2020-05-13 ENCOUNTER — Telehealth
Admit: 2020-05-12 | Discharge: 2020-05-13 | Payer: BLUE CROSS/BLUE SHIELD | Attending: Family Medicine | Primary: Family Medicine

## 2020-05-13 DIAGNOSIS — L089 Local infection of the skin and subcutaneous tissue, unspecified: Secondary | ICD-10-CM

## 2020-05-13 MED ORDER — MUPIROCIN 2 % OINTMENT
2 % | Freq: Every day | CUTANEOUS | 0 refills | Status: DC
Start: 2020-05-13 — End: 2020-06-23

## 2020-05-13 NOTE — Progress Notes (Signed)
 Chief Complaint   Patient presents with   . Arm Pain     3/10, bump near left elbow.     1. Have you been to the ER, urgent care clinic since your last visit?  Hospitalized since your last visit? Yes, [facility], right hand injury.    2. Have you seen or consulted any other health care providers outside of the [facility] since your last visit?  Include any pap smears or colon screening. Yes, [facility] .

## 2020-05-13 NOTE — Progress Notes (Signed)
Alyssa [name] is a 40 y.o. female who was seen by synchronous (real-time) audio-video technology on 05/13/2020.      Consent: Haili Donofrio, who was seen by synchronous (real-time) audio-video technology, and/or her healthcare decision maker, is aware that this patient-initiated, Telehealth encounter on 05/13/2020 is a billable service, with coverage as determined by her insurance carrier. She is aware that she may receive a bill and has provided verbal consent to proceed: Yes.    Assessment & Plan:       ICD-10-CM ICD-9-CM    1. Skin infection  L08.9 686.9 mupirocin (BACTROBAN) 2 % ointment   2. Uncontrolled type 2 diabetes mellitus with hyperglycemia (HCC)  E11.65 250.02      Scheduled for procedure in person on Tuesday  Recommend patient trial mupirocin  Abscess vs cyst  Hot compresses  F/u dm control as well, improving from December, good job, keep titrating        Pt was counseled on risks, benefits and alternatives of treatment options. All questions were asked and answered and the patient was agreeable with the treatment plan as outlined.      Subjective:   Alyssa [name] is a 40 y.o. female who was seen for Arm Pain (3/10, bump near left elbow.)      Flesh colored 1 cm bump that is painful and throbbing at L elbow  It feels like a pimple  It is painful to the touch    Dm: not testing every day (it hurts)  Taking medicine on time compliantly  She tells me that she has been finding sugar ranging 196 +  This is better from where she has been (last a1c was 11.2)  We are titrating trulicity now, she is on 1.5 now, going ot 3 next then 4.5 if tolerating        Medications, allergies, PMH, PSH, SOCH, [facility] reviewed and updated per routine protocol, see chart for review and changes if not noted here.    ROS  A 12 point review of systems was negative except as noted here or in the HPI.    Objective:   Vital Signs: (As obtained by patient/caregiver at home)  Patient-Reported Vitals 05/13/2020   Patient-Reported Weight  289lb   Patient-Reported Height -   Patient-Reported Pulse -   Patient-Reported Temperature -   Patient-Reported SpO2 -   Patient-Reported Systolic  (No Data)   Patient-Reported Diastolic -   Patient-Reported Peak Flow -   Patient-Reported LMP -        [INSTRUCTIONS:  "[x] " Indicates a positive item  "[] " Indicates a negative item  -- DELETE ALL ITEMS NOT EXAMINED]    Constitutional: [x]  Appears well-developed and well-nourished [x]  No apparent distress      []  Abnormal -     Mental status: [x]  Alert and awake  [x]  Oriented to person/place/time [x]  Able to follow commands    []  Abnormal -     Eyes:   EOM    [x]   Normal    []  Abnormal -   Sclera  [x]   Normal    []  Abnormal -          Discharge [x]   None visible   []  Abnormal -     HENT: [x]  Normocephalic, atraumatic  []  Abnormal -   [x]  Mouth/Throat: Mucous membranes are moist    External Ears [x]  Normal  []  Abnormal -    Neck: [x]  No visualized mass []  Abnormal -     Pulmonary/Chest: [x]  Respiratory  effort normal   [x]  No visualized signs of difficulty breathing or respiratory distress        []  Abnormal -      Musculoskeletal:   []  Normal gait with no signs of ataxia         [x]  Normal range of motion of neck        []  Abnormal -     Neurological:        [x]  No Facial Asymmetry (Cranial nerve 7 motor function) (limited exam due to video visit)          [x]  No gaze palsy        []  Abnormal -          Skin:        [x]  No significant exanthematous lesions or discoloration noted on facial skin         []  Abnormal -            Psychiatric:       [x]  Normal Affect []  Abnormal -        [x]  No Hallucinations    Other pertinent observable physical exam findings:fleshy papule on L lateral epicondyle    We discussed the expected course, resolution and complications of the diagnosis(es) in detail.  Medication risks, benefits, costs, interactions, and alternatives were discussed as indicated.  I advised her to contact the office if her condition worsens, changes or fails to  improve as anticipated. She expressed understanding with the diagnosis(es) and plan.       Alyssa [name] is a 40 y.o. female who was evaluated by a video visit encounter for concerns as above. Patient identification was verified prior to start of the visit. A caregiver was present when appropriate. Due to this being a Scientist, physiological (During GURKY-70 public health emergency), evaluation of the following organ systems was limited: Vitals/Constitutional/EENT/Resp/CV/GI/GU/MS/Neuro/Skin/Heme-Lymph-Imm.  Pursuant to the emergency declaration under the [place], 1135 waiver authority and the [employer] Act, this Virtual  Visit was conducted, with patient's (and/or legal guardian's) consent, to reduce the patient's risk of exposure to COVID-19 and provide necessary medical care.     Services were provided through a video synchronous discussion virtually to substitute for in-person clinic visit.   Patient and provider were located at their individual homes.      Doneen Poisson, MD  [facility]  05/13/20 9:42 AM     Portions of this note may have been populated using smart dictation software and may have "sounds-like" errors present.

## 2020-05-17 LAB — COVID-19: SARS-CoV-2, NAA: DETECTED

## 2020-05-17 LAB — NOVEL CORONAVIRUS (COVID-19): SARS-CoV-2, NAA: DETECTED

## 2020-05-18 NOTE — Progress Notes (Signed)
Attempt to reach patient for follow up. Discreet VM left with contact information. Will reach out again in two weeks, 2/14.  Per chart review, patient has appointment with Dr. Aleene Davidson on 2/1.

## 2020-05-19 ENCOUNTER — Ambulatory Visit: Attending: Family Medicine | Primary: Family Medicine

## 2020-05-19 ENCOUNTER — Ambulatory Visit
Admit: 2020-05-19 | Discharge: 2020-05-19 | Payer: BLUE CROSS/BLUE SHIELD | Attending: Family Medicine | Primary: Family Medicine

## 2020-05-19 DIAGNOSIS — L729 Follicular cyst of the skin and subcutaneous tissue, unspecified: Secondary | ICD-10-CM

## 2020-05-19 NOTE — Progress Notes (Signed)
 Identified pt with two pt identifiers(name and DOB). Reviewed record in preparation for visit and have obtained necessary documentation.  Chief Complaint   Patient presents with   . Skin Problem     L elbow         Vitals:    05/19/20 1553 05/19/20 1604   BP: (!) 146/102 (!) 154/100   Pulse: 100    Temp: 98.3 F (36.8 C)    TempSrc: Temporal    SpO2: 96%    Weight: 288 lb (130.6 kg)    Height: 5' 3 (1.6 m)    PainSc:   0 - No pain    LMP: 03/08/2016       Health Maintenance Due   Topic   . DTaP/Tdap/Td series (1 - Tdap)   . Foot Exam Q1        Coordination of Care Questionnaire:  :   1) Have you been to an emergency room, urgent care, or hospitalized since your last visit?  If yes, where when, and reason for visit? No      2. Have seen or consulted any other health care provider since your last visit?   If yes, where when, and reason for visit?  No
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
## 2020-05-26 ENCOUNTER — Ambulatory Visit: Attending: Obstetrics & Gynecology | Primary: Family Medicine

## 2020-05-26 ENCOUNTER — Ambulatory Visit
Admit: 2020-05-26 | Discharge: 2020-05-26 | Payer: BLUE CROSS/BLUE SHIELD | Attending: Obstetrics & Gynecology | Primary: Family Medicine

## 2020-05-26 DIAGNOSIS — Z01419 Encounter for gynecological examination (general) (routine) without abnormal findings: Secondary | ICD-10-CM
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
## 2020-05-28 ENCOUNTER — Inpatient Hospital Stay
Admit: 2020-05-28 | Payer: BLUE CROSS/BLUE SHIELD | Attending: Rehabilitative and Restorative Service Providers" | Primary: Family Medicine

## 2020-05-28 DIAGNOSIS — M6208 Separation of muscle (nontraumatic), other site: Secondary | ICD-10-CM

## 2020-05-28 NOTE — Progress Notes (Signed)
PT DAILY TREATMENT NOTE 2-15    Patient Name: Alyssa [name]  Date:05/28/2020  DOB: [birth year]  [x]   Patient DOB Verified  Payor: BLUE CROSS / Plan: [employer] / Product Type: PPO /    In time: 515  Out time: 0600  Total Treatment Time (min): 45  Visit #: 12    Treatment Area: Diastasis recti [M62.08]  Pain in right knee [M25.561]    SUBJECTIVE  Pain Level (0-10 scale): 5/10  Any medication changes, allergies to medications, adverse drug reactions, diagnosis change, or new procedure performed?: [x]  No    []  Yes (see summary sheet for update)  Subjective functional status/changes:   []  No changes reported  Patient reports that she is good with the exercises, but not doing them consistently, contracted COVID over past few weeks.    OBJECTIVE    Modality rationale: decrease pain and increase tissue extensibility to improve the patient's ability to perform ADLs   Min Type Additional Details       - Estim: -Att   -Unatt    -TENS instruct                  -IFC  -Premod   -NMES                     -Other:  -w/US   -w/ice   -w/heat  Position:  Location:       -  Traction: - Cervical       -Lumbar                       - Prone          -Supine                       -Intermittent   -Continuous Lbs:  - before manual  - after manual  -w/heat    -  Ultrasound: -Continuous   - Pulsed                       at: -   - Location:  W/cm2:    - Paraffin         Location:   -w/heat   1 -  Ice     -  Heat during supine Ex  -  Ice massage Position:supine  Location:LB    -  Laser  -  Other: Position:  Location:      -  Vasopneumatic Device Pressure:       - lo - med - hi   Temperature:      - Skin assessment post-treatment:  -intact -redness- no adverse reaction    -redness - adverse reaction:         45 min Neuromuscular Re-education:  [x]   See flow sheet :   Rationale: increase ROM, increase strength, improve coordination and increase proprioception  to improve the patient's ability to perform ADLs without  pain              With   []  TE   []  TA   []  Neuro   []  SC   []  other: Patient Education: [x]  Review HEP    []  Progressed/Changed HEP based on:   []  positioning   []  body mechanics   []  transfers   []  heat/ice application    []  other:      Other Objective/Functional Measures: Patient needing manual cues  for lateral and posterior expansion     Pain Level (0-10 scale) post treatment: 2/10    ASSESSMENT/Changes in Function:     Patient will continue to benefit from skilled PT services to modify and progress therapeutic interventions, address functional mobility deficits, address ROM deficits, address strength deficits, analyze and address soft tissue restrictions, analyze and cue movement patterns, analyze and modify body mechanics/ergonomics and assess and modify postural abnormalities to attain remaining goals.     []   See Plan of Care  []   See progress note/recertification  []   See Discharge Summary         Progress towards goals / Updated goals:  Demonstrates good potential to meet goals at this time    PLAN  []   Upgrade activities as tolerated     [x]   Continue plan of care  []   Update interventions per flow sheet       []   Discharge due to:_  []   Other:_      Christy Gentles, PT 05/28/2020

## 2020-06-01 NOTE — Progress Notes (Signed)
Attempt to reach patient for follow up. Discreet VM left with contact information.   Per chart review, patient has had her GYN appt on 2/8 and is working with PT for right knee pain.  Will reach out again in the next two weeks, 2/28.

## 2020-06-02 ENCOUNTER — Institutional Professional Consult (permissible substitution): Primary: Family Medicine

## 2020-06-02 ENCOUNTER — Institutional Professional Consult (permissible substitution): Admit: 2020-06-02 | Discharge: 2020-06-02 | Payer: BLUE CROSS/BLUE SHIELD | Primary: Family Medicine

## 2020-06-02 DIAGNOSIS — Z23 Encounter for immunization: Secondary | ICD-10-CM

## 2020-06-02 NOTE — Telephone Encounter (Signed)
I think she will need a nurse visit for her tdap, please help her schedule this where it fits into the office flow

## 2020-06-02 NOTE — Telephone Encounter (Signed)
Pt stopped by stating that her MyChart states she needs a T-Dap and a referral for diabetic foot care. Can she get the T-dap while here today?

## 2020-06-02 NOTE — Telephone Encounter (Signed)
Pt called back to check on status of request for referral for diabetic foot care.     Pt advised message is pending approval from Dr. Aleene Davidson and agrees to wait for call back to advise. Ldm

## 2020-06-04 ENCOUNTER — Encounter: Payer: BLUE CROSS/BLUE SHIELD | Attending: Rehabilitative and Restorative Service Providers" | Primary: Family Medicine

## 2020-06-11 ENCOUNTER — Inpatient Hospital Stay
Admit: 2020-06-11 | Payer: BLUE CROSS/BLUE SHIELD | Attending: Rehabilitative and Restorative Service Providers" | Primary: Family Medicine

## 2020-06-11 NOTE — Progress Notes (Signed)
 PT DAILY TREATMENT NOTE 2-15    Patient Name: Alyssa [name]  Date:06/11/2020  DOB: 21-Sep-1980  [x]   Patient DOB Verified  Payor: BLUE CROSS / Plan: [employer] / Product Type: PPO /    In time: 905  Out time: 0945  Total Treatment Time (min): 40  Visit #: 3    Treatment Area: Diastasis recti [M62.08]  Pain in right knee [M25.561]    SUBJECTIVE  Pain Level (0-10 scale): 4/10  Any medication changes, allergies to medications, adverse drug reactions, diagnosis change, or new procedure performed?: [x]  No    []  Yes (see summary sheet for update)  Subjective functional status/changes:   []  No changes reported  Patient reports that her back and knee pain is improved and she likes the exercises.  She is still having knee pain if she is up too much.  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    OBJECTIVE    Modality rationale: decrease pain and increase tissue extensibility to improve the patient's ability to perform ADLs   Min Type Additional Details       - Estim: -Att   -Unatt    -TENS instruct                  -IFC  -Premod   -NMES                     -Other:  -w/US    -w/ice   -w/heat  Position:  Location:       -  Traction: - Cervical       -Lumbar                       - Prone          -Supine                       -Intermittent   -Continuous Lbs:  - before manual  - after manual  -w/heat    -  Ultrasound: -Continuous   - Pulsed                       at: -   - Location:  W/cm2:    - Paraffin         Location:   -w/heat   NT -  Ice     -  Heat during supine Ex  -  Ice massage Position:supine  Location:LB    -  Laser  -  Other: Position:  Location:      -  Vasopneumatic Device Pressure:       - lo - med - hi   Temperature:      - Skin assessment post-treatment:  -intact -redness- no adverse reaction    -redness - adverse reaction:         10 min Neuromuscular Re-education:  [x]   See flow sheet :   Rationale: increase ROM, increase strength, improve coordination and increase proprioception   to improve the patient's ability to perform ADLs without pain    30 min Thereapuetic Exercise:  [x]   See flow sheet :   Rationale: increase ROM, increase strength, improve coordination and increase proprioception  to improve the patient's ability to perform ADLs without pain            With   []  TE   []  TA   []  Neuro   []  SC   []   other: Patient Education: [x]  Review HEP    []  Progressed/Changed HEP based on:   []  positioning   []  body mechanics   []  transfers   []  heat/ice application    []  other:      Other Objective/Functional Measures: Patient fatigues quickly with exercise, will advance as tolerated for strength and endurance each visit     Pain Level (0-10 scale) post treatment: 2/10    ASSESSMENT/Changes in Function:     Patient will continue to benefit from skilled PT services to modify and progress therapeutic interventions, address functional mobility deficits, address ROM deficits, address strength deficits, analyze and address soft tissue restrictions, analyze and cue movement patterns, analyze and modify body mechanics/ergonomics and assess and modify postural abnormalities to attain remaining goals.     []   See Plan of Care  []   See progress note/recertification  []   See Discharge Summary         Progress towards goals / Updated goals:  Continues to demonstrate increased tolerance to exercise    PLAN  []   Upgrade activities as tolerated     [x]   Continue plan of care  []   Update interventions per flow sheet       []   Discharge due to:_  []   Other:_      [name], PT 06/11/2020

## 2020-06-15 NOTE — Progress Notes (Signed)
Briefly spoke with patient, states she will call back - dealing with something with her son.    If I don't hear back, will follow up in one week, 3/7.
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
## 2020-06-17 ENCOUNTER — Encounter: Primary: Family Medicine

## 2020-06-18 ENCOUNTER — Inpatient Hospital Stay
Admit: 2020-06-18 | Payer: BLUE CROSS/BLUE SHIELD | Attending: Rehabilitative and Restorative Service Providers" | Primary: Family Medicine

## 2020-06-18 DIAGNOSIS — M6208 Separation of muscle (nontraumatic), other site: Secondary | ICD-10-CM

## 2020-06-18 NOTE — Progress Notes (Signed)
PT DAILY TREATMENT NOTE 2-15    Patient Name: Alyssa [name]  Date:06/18/2020  DOB: [birth year]  [x]   Patient DOB Verified  Payor: BLUE CROSS / Plan: BSHSI ANTHEM BCBS [place] / Product Type: PPO /    In time: 845am  Out time: 938am  Total Treatment Time (min): 42  Visit #: 4    Treatment Area: Diastasis recti [M62.08]  Pain in right knee [M25.561]    SUBJECTIVE  Pain Level (0-10 scale): 4/10  Any medication changes, allergies to medications, adverse drug reactions, diagnosis change, or new procedure performed?: [x]  No    []  Yes (see summary sheet for update)  Subjective functional status/changes:   []  No changes reported  Pt reports that the day after previous PT visit she had increased pain for one day, but then felt better.     OBJECTIVE    Modality rationale: decrease pain and increase tissue extensibility to improve the patient???s ability to perform ADLs   Min Type Additional Details       - Estim: -Att   -Unatt    -TENS instruct                  -IFC  -Premod   -NMES                     -Other:  -w/US   -w/ice   -w/heat  Position:  Location:       -  Traction: - Cervical       -Lumbar                       - Prone          -Supine                       -Intermittent   -Continuous Lbs:  - before manual  - after manual  -w/heat    -  Ultrasound: -Continuous   - Pulsed                       at: -1MHz   -3MHz Location:  W/cm2:    - Paraffin         Location:   -w/heat   10 -  Ice     -  Heat during supine Ex  -  Ice massage Position:supine  Location:LB    -  Laser  -  Other: Position:  Location:      -  Vasopneumatic Device Pressure:       - lo - med - hi   Temperature:      - Skin assessment post-treatment:  -intact -redness- no adverse reaction    -redness ??? adverse reaction:         13 min Neuromuscular Re-education:  [x]   See flow sheet :   Rationale: increase ROM, increase strength, improve coordination and increase proprioception  to improve the patient???s ability to perform ADLs without pain    30 min  Thereapuetic Exercise:  [x]   See flow sheet :   Rationale: increase ROM, increase strength, improve coordination and increase proprioception  to improve the patient???s ability to perform ADLs without pain            With   []  TE   []  TA   []  Neuro   []  SC   []  other: Patient Education: [x]  Review HEP    []  Progressed/Changed HEP based  on:   []  positioning   []  body mechanics   []  transfers   []  heat/ice application    []  other:      Other Objective/Functional Measures: ---    Pain Level (0-10 scale) post treatment: 2/10    ASSESSMENT/Changes in Function:   Pt required cueing for proper core stability and postural alignment with standing exercises.   Patient will continue to benefit from skilled PT services to modify and progress therapeutic interventions, address functional mobility deficits, address ROM deficits, address strength deficits, analyze and address soft tissue restrictions, analyze and cue movement patterns, analyze and modify body mechanics/ergonomics and assess and modify postural abnormalities to attain remaining goals.     []   See Plan of Care  []   See progress note/recertification  []   See Discharge Summary         Progress towards goals / Updated goals:  Continues to demonstrate increased tolerance to exercise    PLAN  []   Upgrade activities as tolerated     [x]   Continue plan of care  [x]   Update interventions per flow sheet       []   Discharge due to:_  []   Other:_      , PT 06/18/2020

## 2020-06-23 MED ORDER — TRULICITY 3 MG/0.5 ML SUBCUTANEOUS PEN INJECTOR
3 mg/0.5 mL | SUBCUTANEOUS | 1 refills | Status: DC
Start: 2020-06-23 — End: 2020-07-14
  Filled 2020-06-30: qty 2, 28d supply, fill #0

## 2020-06-23 NOTE — Telephone Encounter (Signed)
Telephone Encounter by Madelin Rear, MD at 06/23/20 1655                Author: Madelin Rear, MD  Service: --  Author Type: Physician       Filed: 06/23/20 1655  Encounter Date: 06/23/2020  Status: Signed          Editor: Madelin Rear, MD (Physician)          From: Aron Baba: Madelin Rear, MDSent: 06/23/2020  2:19 PM ESTSubject: TrulicityI called to get a new trulicity but was told that I didn't  have one for the next level. I had just finished the 1.5 end needed the me at dose. Could you please call them and fix whatever so that I could get the meds. Thanks Ps

## 2020-06-24 ENCOUNTER — Encounter: Primary: Family Medicine

## 2020-06-25 ENCOUNTER — Inpatient Hospital Stay
Admit: 2020-06-25 | Payer: BLUE CROSS/BLUE SHIELD | Attending: Rehabilitative and Restorative Service Providers" | Primary: Family Medicine

## 2020-06-25 NOTE — Progress Notes (Signed)
Physical Therapy at [facility],   a part of [facility]  [address] [place], [address]  [place], [place] [address]  Phone: [phone]      Fax:  [phone]    Progress Note    Name: Alyssa [name]   DOB: 10/09/1980   MD: Scheryl Marten, MD       Treatment Diagnosis: Diastasis recti [M62.08]  Pain in right knee [M25.561]  Start of Care: 05/12/20    Visits from Start of Care: 5  Missed Visits: 0    Summary of Care:Patient is making slow but steady progress towards strength and pain goals needed for her return to ADLs and work duties at [employer].    Assessment / Recommendations:     Short Term Goals: To be accomplished in 4 weeks:50%  To reduce pain to <5/10 so patient can walk up down stairs without difficulty  To decrease swelling by 1 cm so patient can sit to drive without pain  To demonstrate compliance with SIJ protection strategies for transfer in/out car and bed  To decrease overall pain to <5/10 so patient can sleep through the night  To improve TrA and LE strength by 1 ms grade so that patient can tolerate standing to cook/clean for 30 minutes    Long Term Goals: To be accomplished in 12 weeks:  To improve LE to 5/5 strength so that patient can perform all work and recreational activities without pain  Decrease pain <2/10 so patient can walk/stand for 1 hour to perform ADL's and work duties  Improve TrA strength to 5/5 so patient can lift/carry groceries, perform household cleaning and work duties  Improve all special test findings to negative so patient can stand and walk pain free  To increase LE strength to 5/5 without pain so patient can squat to the floor without difficulty  Decrease swelling to minimal so patient can sleep through night with out pain      Christy Gentles, PT 06/25/2020

## 2020-06-25 NOTE — Progress Notes (Signed)
 PT DAILY TREATMENT NOTE 2-15    Patient Name: Alyssa [name]  Date:06/25/2020  DOB: Aug 17, 1980  [x]   Patient DOB Verified  Payor: BLUE CROSS / Plan: [employer] ANTHEM [employer] / Product Type: PPO /    In time: 900am  Out time: 945am  Total Treatment Time (min): 45  Visit #: 5    Treatment Area: Diastasis recti [M62.08]  Pain in right knee [M25.561]    SUBJECTIVE  Pain Level (0-10 scale): 7/10  Any medication changes, allergies to medications, adverse drug reactions, diagnosis change, or new procedure performed?: [x]  No    []  Yes (see summary sheet for update)  Subjective functional status/changes:   []  No changes reported  Patient reports that she is sore after each session but feels better afterward.  She is still waking up in 7/10 pain.  The stiffness and pain improves with progression of the day.  She is still taking Customer service manager everyday.    OBJECTIVE    Modality rationale: decrease pain and increase tissue extensibility to improve the patient's ability to perform ADLs   Min Type Additional Details       - Estim: -Att   -Unatt    -TENS instruct                  -IFC  -Premod   -NMES                     -Other:  -w/US    -w/ice   -w/heat  Position:  Location:       -  Traction: - Cervical       -Lumbar                       - Prone          -Supine                       -Intermittent   -Continuous Lbs:  - before manual  - after manual  -w/heat    -  Ultrasound: -Continuous   - Pulsed                       at: -   - Location:  W/cm2:    - Paraffin         Location:   -w/heat   10 -  Ice     -  Heat during supine Ex  -  Ice massage Position:supine  Location:LB    -  Laser  -  Other: Position:  Location:      -  Vasopneumatic Device Pressure:       - lo - med - hi   Temperature:      - Skin assessment post-treatment:  -intact -redness- no adverse reaction    -redness - adverse reaction:         15 min Neuromuscular Re-education:  [x]   See flow sheet :   Rationale: increase ROM, increase strength, improve  coordination and increase proprioception  to improve the patient's ability to perform ADLs without pain    30 min Thereapuetic Exercise:  [x]   See flow sheet :   Rationale: increase ROM, increase strength, improve coordination and increase proprioception  to improve the patient's ability to perform ADLs without pain            With   []  TE   []  TA   []  Neuro   []   SC   []  other: Patient Education: [x]  Review HEP    []  Progressed/Changed HEP based on:   []  positioning   []  body mechanics   []  transfers   []  heat/ice application    []  other:      Other Objective/Functional Measures: Demonstrates increased tolerance to exercises today.  Increased repetitions and endurance for bike and standing.    Pain Level (0-10 scale) post treatment: 4/10    ASSESSMENT/Changes in Function:   Pt required cueing for proper core stability and postural alignment with standing exercises.   Patient will continue to benefit from skilled PT services to modify and progress therapeutic interventions, address functional mobility deficits, address ROM deficits, address strength deficits, analyze and address soft tissue restrictions, analyze and cue movement patterns, analyze and modify body mechanics/ergonomics and assess and modify postural abnormalities to attain remaining goals.     []   See Plan of Care  []   See progress note/recertification  []   See Discharge Summary         Progress towards goals / Updated goals:  Short Term Goals: To be accomplished in 4 weeks:50% MET  To reduce pain to <5/10 so patient can walk up down stairs without difficulty  To decrease swelling by 1 cm so patient can sit to drive without pain  To demonstrate compliance with SIJ protection strategies for transfer in/out car and bed  To decrease overall pain to <5/10 so patient can sleep through the night  To improve TrA and LE strength by 1 ms grade so that patient can tolerate standing to cook/clean for 30 minutes    Long Term Goals: To be accomplished in 12  weeks:  To improve LE to 5/5 strength so that patient can perform all work and recreational activities without pain  Decrease pain <2/10 so patient can walk/stand for 1 hour to perform ADL's and work duties  Improve TrA strength to 5/5 so patient can lift/carry groceries, perform household cleaning and work duties  Improve all special test findings to negative so patient can stand and walk pain free  To increase LE strength to 5/5 without pain so patient can squat to the floor without difficulty  Decrease swelling to minimal so patient can sleep through night with out pain    PLAN  []   Upgrade activities as tolerated     [x]   Continue plan of care  [x]   Update interventions per flow sheet       []   Discharge due to:_  []   Other:_      [name], PT 06/25/2020

## 2020-06-30 ENCOUNTER — Encounter

## 2020-06-30 ENCOUNTER — Inpatient Hospital Stay: Admit: 2020-06-30 | Payer: BLUE CROSS/BLUE SHIELD | Primary: Family Medicine

## 2020-06-30 ENCOUNTER — Ambulatory Visit: Admit: 2020-06-30 | Discharge: 2020-06-30 | Payer: BLUE CROSS/BLUE SHIELD | Primary: Family Medicine

## 2020-06-30 ENCOUNTER — Ambulatory Visit: Payer: BLUE CROSS/BLUE SHIELD | Attending: Family Medicine | Primary: Family Medicine

## 2020-06-30 DIAGNOSIS — Z1231 Encounter for screening mammogram for malignant neoplasm of breast: Secondary | ICD-10-CM
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
## 2020-07-02 ENCOUNTER — Inpatient Hospital Stay
Admit: 2020-07-02 | Payer: BLUE CROSS/BLUE SHIELD | Attending: Rehabilitative and Restorative Service Providers" | Primary: Family Medicine

## 2020-07-02 NOTE — Progress Notes (Addendum)
 Progress Notes by Gearldean Valery BIRCH, PT at 07/02/20 9085                Author: Gearldean Valery BIRCH, PT  Service: Physical Therapy  Author Type: Physical Therapist       Filed: 07/02/20 0947  Date of Service: 07/02/20 0914  Status: Addendum          Editor: Gearldean Valery BIRCH, PT (Physical Therapist)          Related Notes: Original Note by Gearldean Valery BIRCH, PT (Physical Therapist) filed at 07/02/20 0946               PT DAILY TREATMENT NOTE 2-15      Patient Name: Alyssa [name]   Date:07/02/2020   DOB: 19-Oct-1980   [x]   Patient DOB Verified   Payor: BLUE CROSS / Plan: [employer] / Product Type: PPO /     In time: 900am  Out time: 945am   Total Treatment Time (min): 45   Visit #: 6      Treatment Area: Diastasis recti [M62.08]   Pain in right knee [M25.561]      SUBJECTIVE   Pain Level (0-10 scale): 4/10   Any medication changes, allergies to medications, adverse drug reactions, diagnosis change, or new procedure performed?: [x]   No    []  Yes (see summary sheet for update)   Subjective functional status/changes:   []   No changes reported   Patient reports that she is doing better overall.  Wakes up with the most stiffness in the back and knee (6/10 pain), but the pain eases up as the day goes on.      OBJECTIVE         Modality rationale:  decrease pain and increase tissue extensibility to improve the patients ability to perform  ADLs         Min  Type  Additional Details               - Estim:  -Att   -Unatt     -TENS instruct                   -IFC  - Premod   -NMES                      -Other:   -w/US     -w/ice   -w/heat   Position:   Location:           -  Traction:  - Cervical       -Lumbar                        - Prone          - Supine                        -Intermittent   - Continuous  Lbs:   - before manual   - after manual   -w/heat       -  Ultrasound:  -Continuous   - Pulsed                        at: -   -  Location:   W/cm2:       - Paraffin           Location:    -w/heat  10  -  Ice      -  Heat during supine Ex   -  Ice massage  Position:supine   Location:LB       -  Laser   -  Other:  Position:   Location:              -  Vasopneumatic Device  Pressure:       - lo  - med - hi    Temperature:         - Skin assessment post-treatment:  - intact -redness- no adverse reaction     -redness - adverse reaction:                 nt  min  Neuromuscular Re-education:  [x]    See flow sheet :     Rationale:  increase ROM, increase strength, improve coordination and increase proprioception  to improve the patients ability to perform ADLs without pain          45  min  Thereapuetic Exercise:  [x]    See flow sheet :     Rationale: increase ROM, increase strength, improve coordination and  increase proprioception  to improve the patients ability to perform ADLs without pain                  With    []   TE    []  TA    []  Neuro    []  SC    []  other:  Patient Education: [x]   Review HEP     []  Progressed/Changed HEP based on:    []  positioning   []  body mechanics   []   transfers   []  heat/ice application     []  other:         Other Objective/Functional Measures: Increased tolerance for exercises for LE strengthening and endurance      Pain Level (0-10 scale) post treatment: 4/10      ASSESSMENT/Changes in Function:       Patient will continue to benefit from skilled PT services to modify and progress therapeutic interventions, address functional mobility deficits, address ROM deficits, address strength deficits,  analyze and address soft tissue restrictions, analyze and cue movement patterns, analyze and modify body mechanics/ergonomics and assess and modify postural abnormalities to attain remaining goals.       []   See Plan of Care   []   See progress note/recertification   []    See Discharge Summary           Progress towards goals / Updated goals:Continues to make steady progress towards all strength and pain goals      Short Term Goals: To be accomplished in 4 weeks:50% MET   To reduce pain to  <5/10 so patient can walk up down stairs without difficulty   To decrease swelling by 1 cm so patient can sit to drive without pain   To demonstrate compliance with SIJ protection strategies for transfer in/out car and bed   To decrease overall pain to <5/10 so patient can sleep through the night   To improve TrA and LE strength by 1 ms grade so that patient can tolerate standing to cook/clean for 30 minutes      Long Term Goals: To be accomplished in 12 weeks:   To improve LE to 5/5 strength so that patient can perform all work and recreational activities without pain   Decrease pain <2/10 so patient can walk/stand for 1 hour to perform  ADL's and work duties   Improve TrA strength to 5/5 so patient can Therapist, occupational, perform household cleaning and work duties   Improve all special test findings to negative so patient can stand and walk pain free   To increase LE strength to 5/5 without pain so patient can squat to the floor without difficulty   Decrease swelling to minimal so patient can sleep through night with out pain      PLAN   []   Upgrade activities as tolerated     [x]   Continue plan of care   [x]    Update interventions per flow sheet        []   Discharge due to:_   []   Other:_        [name], PT 07/02/2020

## 2020-07-02 NOTE — Telephone Encounter (Signed)
Called patient in regards to a referral for the medical weight loss clinic, No answer, left voicemail.

## 2020-07-09 ENCOUNTER — Inpatient Hospital Stay
Admit: 2020-07-09 | Payer: BLUE CROSS/BLUE SHIELD | Attending: Rehabilitative and Restorative Service Providers" | Primary: Family Medicine

## 2020-07-09 NOTE — Progress Notes (Signed)
 PT DAILY TREATMENT NOTE 2-15    Patient Name: Alyssa [name]  Date:07/09/2020  DOB: [birth year]  [x]   Patient DOB Verified  Payor: BLUE CROSS / Plan: [employer] / Product Type: PPO /    In time: 900am  Out time: 945am  Total Treatment Time (min): 45  Visit #: 7    Treatment Area: Diastasis recti [M62.08]  Pain in right knee [M25.561]    SUBJECTIVE  Pain Level (0-10 scale): 3/10  Any medication changes, allergies to medications, adverse drug reactions, diagnosis change, or new procedure performed?: [x]  No    []  Yes (see summary sheet for update)  Subjective functional status/changes:   []  No changes reported  Felt good for 48 hours after last PT session.  The stiffness creeps back up after a couple days.    OBJECTIVE    Modality rationale: decrease pain and increase tissue extensibility to improve the patient's ability to perform ADLs   Min Type Additional Details       - Estim: -Att   -Unatt    -TENS instruct                  -IFC  -Premod   -NMES                     -Other:  -w/US    -w/ice   -w/heat  Position:  Location:       -  Traction: - Cervical       -Lumbar                       - Prone          -Supine                       -Intermittent   -Continuous Lbs:  - before manual  - after manual  -w/heat    -  Ultrasound: -Continuous   - Pulsed                       at: -   - Location:  W/cm2:    - Paraffin         Location:   -w/heat   10 -  Ice     -  Heat during supine Ex  -  Ice massage Position:supine  Location:LB    -  Laser  -  Other: Position:  Location:      -  Vasopneumatic Device Pressure:       - lo - med - hi   Temperature:      - Skin assessment post-treatment:  -intact -redness- no adverse reaction    -redness - adverse reaction:         nt min Neuromuscular Re-education:  [x]   See flow sheet :   Rationale: increase ROM, increase strength, improve coordination and increase proprioception  to improve the patient's ability to perform ADLs without pain    45 min Thereapuetic  Exercise:  [x]   See flow sheet :   Rationale: increase ROM, increase strength, improve coordination and increase proprioception  to improve the patient's ability to perform ADLs without pain            With   []  TE   []  TA   []  Neuro   []  SC   []  other: Patient Education: [x]  Review HEP    []  Progressed/Changed HEP based on:   []   positioning   []  body mechanics   []  transfers   []  heat/ice application    []  other:      Other Objective/Functional Measures: Improved cardiac performance today without fatigue.  Pain decreases with exercise    Pain Level (0-10 scale) post treatment: 1/10    ASSESSMENT/Changes in Function:     Patient will continue to benefit from skilled PT services to modify and progress therapeutic interventions, address functional mobility deficits, address ROM deficits, address strength deficits, analyze and address soft tissue restrictions, analyze and cue movement patterns, analyze and modify body mechanics/ergonomics and assess and modify postural abnormalities to attain remaining goals.     []   See Plan of Care  []   See progress note/recertification  []   See Discharge Summary         Progress towards goals / Updated goals: Slow and steady progress towards all strength and conditioning goals.    Short Term Goals: To be accomplished in 4 weeks:50% MET  To reduce pain to <5/10 so patient can walk up down stairs without difficulty  To decrease swelling by 1 cm so patient can sit to drive without pain  To demonstrate compliance with SIJ protection strategies for transfer in/out car and bed  To decrease overall pain to <5/10 so patient can sleep through the night  To improve TrA and LE strength by 1 ms grade so that patient can tolerate standing to cook/clean for 30 minutes    Long Term Goals: To be accomplished in 12 weeks:  To improve LE to 5/5 strength so that patient can perform all work and recreational activities without pain  Decrease pain <2/10 so patient can walk/stand for 1 hour to perform  ADL's and work duties  Improve TrA strength to 5/5 so patient can lift/carry groceries, perform household cleaning and work duties  Improve all special test findings to negative so patient can stand and walk pain free  To increase LE strength to 5/5 without pain so patient can squat to the floor without difficulty  Decrease swelling to minimal so patient can sleep through night with out pain    PLAN  []   Upgrade activities as tolerated     [x]   Continue plan of care  [x]   Update interventions per flow sheet       []   Discharge due to:_  []   Other:_      [name], PT 07/09/2020

## 2020-07-14 ENCOUNTER — Ambulatory Visit: Attending: Family Medicine | Primary: Family Medicine

## 2020-07-14 ENCOUNTER — Ambulatory Visit
Admit: 2020-07-14 | Discharge: 2020-07-14 | Payer: BLUE CROSS/BLUE SHIELD | Attending: Family Medicine | Primary: Family Medicine

## 2020-07-14 DIAGNOSIS — E119 Type 2 diabetes mellitus without complications: Secondary | ICD-10-CM

## 2020-07-14 MED ORDER — LISINOPRIL-HYDROCHLOROTHIAZIDE 10 MG-12.5 MG TAB
ORAL_TABLET | Freq: Every morning | ORAL | 1 refills | Status: DC
Start: 2020-07-14 — End: 2020-12-16
  Filled 2020-07-16: qty 90, 90d supply, fill #0

## 2020-07-14 MED ORDER — ROSUVASTATIN 10 MG TAB
10 mg | ORAL_TABLET | Freq: Every evening | ORAL | 3 refills | Status: DC
Start: 2020-07-14 — End: 2021-03-31
  Filled 2020-07-16: qty 90, 90d supply, fill #0

## 2020-07-14 MED ORDER — METFORMIN ER 500 MG 24 HR TABLET,EXTENDED RELEASE
500 mg | ORAL_TABLET | Freq: Every day | ORAL | 3 refills | Status: DC
Start: 2020-07-14 — End: 2020-07-16

## 2020-07-14 MED ORDER — TRULICITY 4.5 MG/0.5 ML SUBCUTANEOUS PEN INJECTOR
4.5 mg/0.5 mL | SUBCUTANEOUS | 3 refills | Status: DC
Start: 2020-07-14 — End: 2020-08-15
  Filled 2020-07-21: qty 6, 84d supply, fill #0

## 2020-07-14 MED ORDER — LANTUS SOLOSTAR U-100 INSULIN 100 UNIT/ML (3 ML) SUBCUTANEOUS PEN
100 unit/mL (3 mL) | PEN_INJECTOR | SUBCUTANEOUS | 5 refills | Status: DC
Start: 2020-07-14 — End: 2020-07-14
  Filled 2020-07-16: qty 24, 90d supply, fill #0

## 2020-07-14 MED ORDER — TOUJEO MAX U-300 SOLOSTAR 300 UNIT/ML (3 ML) SUBCUTANEOUS INSULIN PEN
300 unit/mL (3 mL) | Freq: Every evening | SUBCUTANEOUS | 3 refills | Status: DC
Start: 2020-07-14 — End: 2020-12-16

## 2020-07-14 NOTE — Progress Notes (Signed)
 Identified pt with two pt identifiers(name and DOB). Reviewed record in preparation for visit and have obtained necessary documentation.  Chief Complaint   Patient presents with   . Follow-up     3 month   . Diabetes   . Hypertension        Vitals:    07/14/20 1457   BP: (!) 157/96   Pulse: (!) 114   Temp: 97.3 F (36.3 C)   TempSrc: Temporal   SpO2: 96%   Weight: 282 lb (127.9 kg)   Height: 5' 3 (1.6 m)   PainSc:   3   PainLoc: Generalized   LMP: 03/08/2016       Health Maintenance Due   Topic   . Foot Exam Q1    . A1C test (Diabetic or Prediabetic)        Coordination of Care Questionnaire:  :   1) Have you been to an emergency room, urgent care, or hospitalized since your last visit?  If yes, where when, and reason for visit? No      2. Have seen or consulted any other health care provider since your last visit?   If yes, where when, and reason for visit?  No
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
## 2020-07-15 NOTE — Telephone Encounter (Signed)
[facility] faxed request to change Metformin ER 500mg  osmotic to Metformin ER 500mg  normal. States switching generic Glumetza to generic Glucophage due to Insurance.

## 2020-07-16 ENCOUNTER — Inpatient Hospital Stay: Admit: 2020-07-16 | Payer: BLUE CROSS/BLUE SHIELD | Primary: Family Medicine

## 2020-07-16 MED ORDER — METFORMIN SR 500 MG 24 HR TABLET
500 mg | ORAL_TABLET | Freq: Every day | ORAL | 3 refills | Status: DC
Start: 2020-07-16 — End: 2020-12-16
  Filled 2020-07-17: qty 360, 90d supply, fill #0

## 2020-07-16 NOTE — Progress Notes (Signed)
PT DAILY TREATMENT NOTE 2-15    Patient Name: Lakieta Makey  Date:07/16/2020  DOB: 1980/07/27  [x]   Patient DOB Verified  Payor: BLUE CROSS / Plan: [employer] / Product Type: PPO /    In time: 3:25p Out time: 4:25p  Total Treatment Time (min): 60  Visit #: 8    Treatment Area: Diastasis recti [M62.08]  Pain in right knee [M25.561]    SUBJECTIVE  Pain Level (0-10 scale): 9/10  Any medication changes, allergies to medications, adverse drug reactions, diagnosis change, or new procedure performed?: [x]  No    []  Yes (see summary sheet for update)  Subjective functional status/changes:   []  No changes reported  Patient reports she had to sleep in a recliner last night because she was with her son at a sleep study. Patient states she has been in more pain since.    OBJECTIVE    Modality rationale: decrease pain and increase tissue extensibility to improve the patient's ability to perform ADLs   Min Type Additional Details       - Estim: -Att   -Unatt    -TENS instruct                  -IFC  -Premod   -NMES                     -Other:  -w/US   -w/ice   -w/heat  Position:  Location:       -  Traction: - Cervical       -Lumbar                       - Prone          -Supine                       -Intermittent   -Continuous Lbs:  - before manual  - after manual  -w/heat    -  Ultrasound: -Continuous   - Pulsed                       at: -   - Location:  W/cm2:    - Paraffin         Location:   -w/heat   30 -  Ice     -  Heat:pre-15 min; 15 min during supine Ex  -  Ice massage Position:supine  Location:LB    -  Laser  -  Other: Position:  Location:      -  Vasopneumatic Device Pressure:       - lo - med - hi   Temperature:      - Skin assessment post-treatment:  -intact -redness- no adverse reaction    -redness - adverse reaction:         nt min Neuromuscular Re-education:  [x]   See flow sheet :   Rationale: increase ROM, increase strength, improve coordination and increase proprioception  to improve  the patient's ability to perform ADLs without pain    45 min Thereapuetic Exercise:  [x]   See flow sheet :   Rationale: increase ROM, increase strength, improve coordination and increase proprioception  to improve the patient's ability to perform ADLs without pain            With   []  TE   []  TA   []  Neuro   []  SC   []  other: Patient  Education: [x]  Review HEP    []  Progressed/Changed HEP based on:   []  positioning   []  body mechanics   []  transfers   []  heat/ice application    []  other:      Other Objective/Functional Measures:  Limited by pain today    Improved cardiac performance today without fatigue.  Pain decreases with exercise    Pain Level (0-10 scale) post treatment: 4/10    ASSESSMENT/Changes in Function:     Patient will continue to benefit from skilled PT services to modify and progress therapeutic interventions, address functional mobility deficits, address ROM deficits, address strength deficits, analyze and address soft tissue restrictions, analyze and cue movement patterns, analyze and modify body mechanics/ergonomics and assess and modify postural abnormalities to attain remaining goals.     []   See Plan of Care  []   See progress note/recertification  []   See Discharge Summary         Progress towards goals / Updated goals: Patient limited by pain today. Able to perform several exercises after heat without increased pain.    Short Term Goals: To be accomplished in 4 weeks:50% MET  To reduce pain to <5/10 so patient can walk up down stairs without difficulty  To decrease swelling by 1 cm so patient can sit to drive without pain  To demonstrate compliance with SIJ protection strategies for transfer in/out car and bed  To decrease overall pain to <5/10 so patient can sleep through the night  To improve TrA and LE strength by 1 ms grade so that patient can tolerate standing to cook/clean for 30 minutes    Long Term Goals: To be accomplished in 12 weeks:  To improve LE to 5/5 strength so that patient  can perform all work and recreational activities without pain  Decrease pain <2/10 so patient can walk/stand for 1 hour to perform ADL's and work duties  Improve TrA strength to 5/5 so patient can lift/carry groceries, perform household cleaning and work duties  Improve all special test findings to negative so patient can stand and walk pain free  To increase LE strength to 5/5 without pain so patient can squat to the floor without difficulty  Decrease swelling to minimal so patient can sleep through night with out pain    PLAN  []   Upgrade activities as tolerated     [x]   Continue plan of care  [x]   Update interventions per flow sheet       []   Discharge due to:_  []   Other:_      Leona Carry, PTA 07/16/2020

## 2020-07-16 NOTE — Telephone Encounter (Signed)
Change made

## 2020-07-20 NOTE — Telephone Encounter (Signed)
Called pt, and she has been scheduled for 07/29/2020.

## 2020-07-20 NOTE — Telephone Encounter (Signed)
-----   Message from Scheryl Marten, MD sent at 07/14/2020  3:33 PM EDT -----  Regarding: tonya appt needed  Pt estb with tonya [place]  Needs appt about 2-3 wk from now for follow up with tonya if possible

## 2020-07-21 NOTE — Telephone Encounter (Signed)
 Dillon, Caitlin K, MD  - PA for freestyle [place] 2 has been approved, scripts pended    Thank you,  [name], PharmD, BCPS  Population Health Pharmacy  [facility] Clinical Pharmacist  Department: [phone]  ==================================================    POPULATION HEALTH CLINICAL PHARMACY REVIEW - BE WELL WITH DIABETES: HIGH A1C  =============================================  Alyssa [name] is a 40 y.o. female enrolled in the [facility] Be Well with Diabetes program.    Identified care gap(s): A1c > 8%    DM Program Prescriptions:  Current Outpatient Medications   Medication Sig Dispense Refill   . metFORMIN  ER (GLUCOPHAGE  XR) 500 mg tablet Take 4 Tablets by mouth daily (with dinner). 360 Tablet 3   . [START ON 07/24/2020] dulaglutide  (Trulicity ) 4.5 mg/0.5 mL pnij 4.5 mg by SubCUTAneous route Every Friday. 12 Each 3   . rosuvastatin  (CRESTOR ) 10 mg tablet Take 1 Tablet by mouth nightly. 90 Tablet 3   . lisinopril -hydroCHLOROthiazide (PRINZIDE, ZESTORETIC) 10-12.5 mg per tablet Take 1 Tablet by mouth Every morning. 90 Tablet 1   . insulin  glargine U-300 conc (Toujeo  Max U-300 SoloStar) 300 unit/mL (3 mL) inpn 80 Units by SubCUTAneous route nightly. Indications: type 2 diabetes mellitus 24 mL 3   . glucose blood VI test strips (Prodigy No Coding) strip Use to test blood sugars 4 times daily or as directed 400 Strip 3   . valACYclovir  (VALTREX ) 500 mg tablet Take 1 Tablet by mouth two (2) times a day. 180 Tablet 0   . omega 3-DHA-EPA-fish oil (Fish OiL) 1,000 mg (120 mg-180 mg) capsule Take 1 Capsule by mouth daily.     . biotin (VITAMIN B7) 5 mg tablet Take 5 mg by mouth daily.     . Insulin  Needles, Disposable, (Comfort EZ Pen Needles) 31 gauge x 5/16 ndle Leader brand pen needle to use once a day with basal insulin  100 Package 3   . lancets misc Use to test blood sugars once a day as directed 100 Each 3        Allergies:  Allergies   Allergen Reactions   . Betadine  [Povidone-Iodine] Hives and Rash   . Codeine Nausea and Vomiting   . Curry Leaf-Tree Hives   . Sulfa (Sulfonamide Antibiotics) Rash   . Tramadol Itching        Labs:  Lab Results   Component Value Date/Time    Hemoglobin A1c 11.2 (H) 03/27/2020 09:03 AM    Hemoglobin A1c 8.4 (H) 10/14/2019 08:50 AM    Hemoglobin A1c 10.2 (H) 05/13/2019 08:26 AM     Estimated Creatinine Clearance: 137.9 mL/min (by C-G formula based on SCr of 0.68 mg/dL).    Care Team:   - Physician (PCP): Dillion (Last OV: 07/14/20)  - Upcoming appointments:   Future Appointments   Date Time Provider Department Center   07/28/2020  8:00 AM Ramona Rhyme [facility] [place]   07/29/2020 11:30 AM Yolanda Bascom HERO, PHARMD [facility] AM[facility]   10/13/2020  1:15 PM Dillon, Caitlin K, MD [facility] AM[facility]       Refill History:   lantus  03/26/20 for 90 ds, 8/19 for 90 ds, 5/25 for 56 day supply  Lisinopril  04/29/19, 12/05/19   Lisinopril /hctz 07/16/20  Metformin  07/17/20, 03/25/20, 12/06/19  Rosuvastatin  04/28/20, 12/05/19  trulicity  3 mg 07/01/19  trulicity  4.5 mg 07/21/20  toujeo  07/16/20    Diabetes Care:  - Glycemic Goal: <7.0% and directed by provider. Is not at blood glucose goal non adherence.  -  Appropriateness of Insulin  Therapy: managed by pcp  - Therapy Optimization: increase adherence, more consistent checking os sugars, better diet  - Medication changes since last A1c: trulicity  increased to 4.5 mg and change to toujeo   - Medication Adherence Assessment: gaps after Aug 2021 fills- did not fill until Dec or Jan for most meds even with lantus .     Plan:    Reached patient for review     Did she set up freestyle libre that pcp gave her?  - she is wearing this. Last 2 days have been good days, she was in the green pretty much all day these 2 days.     Discussed covered of freestyle libre through our program. She has the [place] 2. She uses her phone.     She does love smoothies. Is trying to do berries in them to be lower in sugar. Collagen, scoop peanut butter, ice, sugar  free juice and berries. Educated to try without the sugar free juice, just use water .     And she states she will also be seeing dietician.     Did not start trulicity  4.5 mg yet. Still has 2 more of the 3 mg. Denies SE to 3 mg.     Is being more adherent with meds but does admit to missing doses last year.     Will see if I can get PA for libre 2 and get scripts.      [name], PharmD, BCPS  Population Health Pharmacy  [facility] Clinical Pharmacist  Department: [phone]    For Pharmacy Admin Tracking Only    . CPA in place: No  . Recommendation Provided To: Provider: 1 via Note to Provider , Patient/Caregiver: 1 via Telephone and Pharmacy: 1  . Intervention Detail: New Rx: 1, reason: Needs Additional Therapy  . Gap Closed?: Yes  . Intervention Accepted By: Provider: 1, Patient/Caregiver: 1 and Pharmacy: 1  . Time Spent (min): 20
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
## 2020-07-23 ENCOUNTER — Encounter: Payer: BLUE CROSS/BLUE SHIELD | Attending: Rehabilitative and Restorative Service Providers" | Primary: Family Medicine

## 2020-07-23 MED ORDER — FREESTYLE LIBRE 2 SENSOR KIT
PACK | 3 refills | Status: DC
Start: 2020-07-23 — End: 2020-12-16

## 2020-07-24 MED FILL — FREESTYLE LIBRE 2 SENSOR  MISC: 84 days supply | Qty: 6 | Fill #0 | Status: AC

## 2020-07-28 ENCOUNTER — Inpatient Hospital Stay: Admit: 2020-07-28 | Payer: BLUE CROSS/BLUE SHIELD | Primary: Family Medicine

## 2020-07-28 DIAGNOSIS — M6208 Separation of muscle (nontraumatic), other site: Secondary | ICD-10-CM

## 2020-07-28 NOTE — Progress Notes (Signed)
 PT DAILY TREATMENT NOTE 2-15    Patient Name: Alyssa [name]  Date:07/28/2020  DOB: 1980-05-10  [x]   Patient DOB Verified  Payor: BLUE CROSS / Plan: [employer] / Product Type: PPO /    In time: 8:14a Out time: 9:04a  Total Treatment Time (min): 50  Visit #: 9    Treatment Area: Diastasis recti [M62.08]  Pain in right knee [M25.561]    SUBJECTIVE  Pain Level (0-10 scale): 3/10  Any medication changes, allergies to medications, adverse drug reactions, diagnosis change, or new procedure performed?: [x]  No    []  Yes (see summary sheet for update)  Subjective functional status/changes:   []  No changes reported  Patient reports she has been feeling much better since last visit and just got back from vacation. Patient reports she feels a little pulling still with bending forward.    OBJECTIVE    Modality rationale: decrease pain and increase tissue extensibility to improve the patient's ability to perform ADLs   Min Type Additional Details       - Estim: -Att   -Unatt    -TENS instruct                  -IFC  -Premod   -NMES                     -Other:  -w/US    -w/ice   -w/heat  Position:  Location:       -  Traction: - Cervical       -Lumbar                       - Prone          -Supine                       -Intermittent   -Continuous Lbs:  - before manual  - after manual  -w/heat    -  Ultrasound: -Continuous   - Pulsed                       at: -   - Location:  W/cm2:    - Paraffin         Location:   -w/heat   10 -  Ice     -  Heat:pre-15 min; 15 min during supine Ex  -  Ice massage Position:supine  Location:LB    -  Laser  -  Other: Position:  Location:      -  Vasopneumatic Device Pressure:       - lo - med - hi   Temperature:      - Skin assessment post-treatment:  -intact -redness- no adverse reaction    -redness - adverse reaction:         nt min Neuromuscular Re-education:  [x]   See flow sheet :   Rationale: increase ROM, increase strength, improve coordination and increase  proprioception  to improve the patient's ability to perform ADLs without pain    40 min Thereapuetic Exercise:  [x]   See flow sheet :   Rationale: increase ROM, increase strength, improve coordination and increase proprioception  to improve the patient's ability to perform ADLs without pain            With   []  TE   []  TA   []  Neuro   []  SC   []  other: Patient Education: [x]   Review HEP    []  Progressed/Changed HEP based on:   []  positioning   []  body mechanics   []  transfers   []  heat/ice application    []  other:      Other Objective/Functional Measures:      Fair tolerance for anti-rotation lateral stepping, mod verbal cues for abdominal stabilization, min increased right QL tightness    Lumbar AROM:  R  L  Flexion:    8 inches  Extension:    75%  Rotation:   75%  75%  Side Bending:   100%  75%    *pulling with rotation and side-bending    LOWER QUARTER MUSCLE STRENGTH      R  L  L1, L2 Psoas   5  5  L3 Quads   4+  5   L4 Tib Ant   5  5   L5 EHL   5  5   S1 FHL   5  5  S2 Hams   5  5        Pain Level (0-10 scale) post treatment: 4/10    ASSESSMENT/Changes in Function:     Patient will continue to benefit from skilled PT services to modify and progress therapeutic interventions, address functional mobility deficits, address ROM deficits, address strength deficits, analyze and address soft tissue restrictions, analyze and cue movement patterns, analyze and modify body mechanics/ergonomics and assess and modify postural abnormalities to attain remaining goals.     []   See Plan of Care  []   See progress note/recertification  []   See Discharge Summary         Progress towards goals / Updated goals: Patient has met all short term goals and is making steady progress towards long term goals. Patient would benefit additional therapy every other week to ensure good progression to HEP.     Short Term Goals: To be accomplished in 4 weeks:   To reduce pain to <5/10 so patient can walk up down stairs without difficulty Met  To  decrease swelling by 1 cm so patient can sit to drive without pain Met  To demonstrate compliance with SIJ protection strategies for transfer in/out car and bed Met  To decrease overall pain to <5/10 so patient can sleep through the night Met  To improve TrA and LE strength by 1 ms grade so that patient can tolerate standing to cook/clean for 30 minutes Met    Long Term Goals: To be accomplished in 12 weeks:  To improve LE to 5/5 strength so that patient can perform all work and recreational activities without pain Nearly Met  Decrease pain <2/10 so patient can walk/stand for 1 hour to perform ADL's and work dutiesPrgoressing  Improve TrA strength to 5/5 so patient can lift/carry groceries, perform household cleaning and work duties Progressing  Improve all special test findings to negative so patient can stand and walk pain free  To increase LE strength to 5/5 without pain so patient can squat to the floor without difficulty  Decrease swelling to minimal so patient can sleep through night with out pain Progressing    PLAN  []   Upgrade activities as tolerated     [x]   Continue plan of care  [x]   Update interventions per flow sheet       []   Discharge due to:_  []   Other:_      Francis Judge, PTA 07/28/2020

## 2020-07-29 ENCOUNTER — Ambulatory Visit: Primary: Family Medicine

## 2020-07-29 ENCOUNTER — Ambulatory Visit: Admit: 2020-07-29 | Payer: BLUE CROSS/BLUE SHIELD | Primary: Family Medicine

## 2020-07-29 DIAGNOSIS — E119 Type 2 diabetes mellitus without complications: Secondary | ICD-10-CM

## 2020-07-29 LAB — AMB POC HEMOGLOBIN A1C
Hemoglobin A1C, POC: 10.2 %
Hemoglobin A1c (POC): 10.2 %

## 2020-07-29 NOTE — Progress Notes (Signed)
 Progress Notes by Yolanda Bascom HERO, PHARMD at 07/29/20 1130                Author: Yolanda Bascom HERO, PHARMD  Service: --  Author Type: Pharmacist       Filed: 07/31/20 1038  Encounter Date: 07/29/2020  Status: Signed          Editor: Yolanda Bascom HERO, PHARMD (Pharmacist)               Pharmacist Visit for Diabetes Management & Education      S/O: Alyssa [name] is a 40 y.o.  female referred by Dr. Dillon, Caitlin K, MD  for diabetes management.         Current Diabetes Regimen:   Metformin  ER 500mg  4 tabs daily   Trulicity  4.5mg  once weekly - will start this dose on Friday has been on 3mg  once weekly   Lantus  - 80 units once daily - has 1 box of 5 pens left before she switches to Toujeo  Max      ROS:    Patient denies hypoglycemic and hyperglycemic signs/symptoms, chest pain, shortness of breath, neuropathy, vision changes, and any foot changes.      Self-Monitoring Blood Glucose/Continuous Glucose Monitoring:                   Diet:   Watching what she's eating more closely   Doing a lot of salmon and vegetables   Kids are also helping her with her dietary choices and being more aware of portions            Data reviewed:     Lab Results         Component  Value  Date/Time            Hemoglobin A1c  11.2 (H)  03/27/2020 09:03 AM       Hemoglobin A1c  8.4 (H)  10/14/2019 08:50 AM       Hemoglobin A1c  10.2 (H)  05/13/2019 08:26 AM       Glucose  258 (H)  03/27/2020 09:03 AM       Glucose (POC)  255 (H)  10/26/2018 02:01 AM       Microalbumin/Creat ratio (mg/g creat)  34 (H)  03/27/2020 09:03 AM       Microalbumin,urine random  3.54  03/27/2020 09:03 AM       LDL, calculated  42.6  03/27/2020 09:03 AM            Creatinine  0.68  03/27/2020 09:03 AM           Diabetes Health Maintenance:   Last eye exam:        Immunizations:     Immunization History        Administered  Date(s) Administered         ?  COVID-19, Pfizer Purple top, DILUTE for use, 12+ yrs, 39mcg/0.3mL dose  09/07/2019, 09/28/2019,  02/11/2020     ?  Influenza Vaccine  01/16/2018, 01/04/2019, 01/11/2020     ?  Influenza Vaccine (Quad) Mdck Pf (>2 Yrs Flucelvax QUAD [id number])  01/04/2019     ?  Influenza Vaccine [employer]) PF (>6 Mo Flulaval, Fluarix, and >3 Yrs Afluria, Fluzone [id number])  02/01/2017     ?  Pneumococcal Polysaccharide (PPSV-23)  03/24/2011         ?  Tdap  06/02/2020           Statin - ACEI/ARB - ASA  Key CAD CHF Meds                             rosuvastatin  (CRESTOR ) 10 mg tablet  Take 1 Tablet by mouth nightly.           lisinopril -hydroCHLOROthiazide (PRINZIDE, ZESTORETIC) 10-12.5 mg per tablet  Take 1 Tablet by mouth Every morning.           omega 3-DHA-EPA-fish oil (Fish OiL) 1,000 mg (120 mg-180 mg) capsule  Take 1 Capsule by mouth daily.                   Assessment/Plan:       1.  Diabetes:  a1c not at goal <7% per ADA guidelines but also not indicative of current blood sugar control. Patient CGM showing that blood sugars are improving. Encouraged patient to increase her  Trulicity  to 4.5mg  once weekly this Friday as planned by PCP and to continue to make positive changes with dietary choices. Patient linked with me for her CGM and will follow up via phone in 1 month to ensure she is still on track. POC a1c done today  which does show improvement but not where patient wants but is a good starting point for the positive changes she has been making. She has enough sensors for right now from [employer] health and will keep me posted on these.          Patient verbalized understanding of the information presented and all of the patients questions were answered.  AVS was handed to the patient and information reviewed.        Patient advised to call me or Dr. Dillon, Caitlin K, MD with any additional questions or concerns.      Follow-up: 08/25/2020      Notification of recommendations will be sent to Dr. Dillon, Caitlin K, MD for review.         Bascom Mouton, PharmD, BCPS, CDCES      For Pharmacy Admin Tracking Only        CPA in  place: Yes     Recommendation Provided To:  Patient/Caregiver: 3 via In person     Intervention Detail: Device Training, Lab(s) Ordered and Scheduled Appointment     Intervention Accepted By:  Patient/Caregiver: 3     Time Spent (min): 45
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
## 2020-08-11 ENCOUNTER — Encounter: Primary: Family Medicine

## 2020-08-13 ENCOUNTER — Inpatient Hospital Stay: Admit: 2020-08-13 | Payer: BLUE CROSS/BLUE SHIELD | Primary: Family Medicine

## 2020-08-13 NOTE — Progress Notes (Signed)
 PT DAILY TREATMENT NOTE 2-15    Patient Name: Alyssa [name]  Date:08/13/2020  DOB: 02-14-1981  [x]   Patient DOB Verified  Payor: BLUE CROSS / Plan: [employer] / Product Type: PPO /    In time: 9:15a Out time: 10:15a  Total Treatment Time (min): 60  Visit #: 10    Treatment Area: Diastasis recti [M62.08]  Pain in right knee [M25.561]    SUBJECTIVE  Pain Level (0-10 scale): 1/10  Any medication changes, allergies to medications, adverse drug reactions, diagnosis change, or new procedure performed?: [x]  No    []  Yes (see summary sheet for update)  Subjective functional status/changes:   []  No changes reported  Patient reports minimal back ache, but has been feeling much better since last visit.    OBJECTIVE    Modality rationale: decrease pain and increase tissue extensibility to improve the patient's ability to perform ADLs   Min Type Additional Details       - Estim: -Att   -Unatt    -TENS instruct                  -IFC  -Premod   -NMES                     -Other:  -w/US    -w/ice   -w/heat  Position:  Location:       -  Traction: - Cervical       -Lumbar                       - Prone          -Supine                       -Intermittent   -Continuous Lbs:  - before manual  - after manual  -w/heat    -  Ultrasound: -Continuous   - Pulsed                       at: -   - Location:  W/cm2:    - Paraffin         Location:   -w/heat   15 -  Ice     -  Heat:post  -  Ice massage Position:reclined  Location:LB    -  Laser  -  Other: Position:  Location:      -  Vasopneumatic Device Pressure:       - lo - med - hi   Temperature:      - Skin assessment post-treatment:  -intact -redness- no adverse reaction    -redness - adverse reaction:         nt min Neuromuscular Re-education:  [x]   See flow sheet :   Rationale: increase ROM, increase strength, improve coordination and increase proprioception  to improve the patient's ability to perform ADLs without pain    40 min Thereapuetic Exercise:  [x]   See  flow sheet :   Rationale: increase ROM, increase strength, improve coordination and increase proprioception  to improve the patient's ability to perform ADLs without pain            With   []  TE   []  TA   []  Neuro   []  SC   []  other: Patient Education: [x]  Review HEP    []  Progressed/Changed HEP based on:   []  positioning   []  body mechanics   []   transfers   []  heat/ice application    []  other:      Other Objective/Functional Measures:      Fair tolerance for anti-rotation lateral stepping, mod verbal cues for abdominal stabilization to prevent back activation      Pain Level (0-10 scale) post treatment: 1/10    ASSESSMENT/Changes in Function: Patient with min increased right knee pain descending second flight stairs during fire alarm.     Patient will continue to benefit from skilled PT services to modify and progress therapeutic interventions, address functional mobility deficits, address ROM deficits, address strength deficits, analyze and address soft tissue restrictions, analyze and cue movement patterns, analyze and modify body mechanics/ergonomics and assess and modify postural abnormalities to attain remaining goals.     []   See Plan of Care  []   See progress note/recertification  []   See Discharge Summary         Progress towards goals / Updated goals: Patient making good progress towards remaining goals. Will go over goals and measurements next visit to assess readiness for discharge.      Short Term Goals: To be accomplished in 4 weeks:   To reduce pain to <5/10 so patient can walk up down stairs without difficulty Met  To decrease swelling by 1 cm so patient can sit to drive without pain Met  To demonstrate compliance with SIJ protection strategies for transfer in/out car and bed Met  To decrease overall pain to <5/10 so patient can sleep through the night Met  To improve TrA and LE strength by 1 ms grade so that patient can tolerate standing to cook/clean for 30 minutes Met    Long Term Goals: To be  accomplished in 12 weeks:  To improve LE to 5/5 strength so that patient can perform all work and recreational activities without pain Nearly Met  Decrease pain <2/10 so patient can walk/stand for 1 hour to perform ADL's and work dutiesPrgoressing  Improve TrA strength to 5/5 so patient can lift/carry groceries, perform household cleaning and work duties Progressing  Improve all special test findings to negative so patient can stand and walk pain free  To increase LE strength to 5/5 without pain so patient can squat to the floor without difficulty  Decrease swelling to minimal so patient can sleep through night with out pain Progressing    PLAN  []   Upgrade activities as tolerated     [x]   Continue plan of care  [x]   Update interventions per flow sheet       []   Discharge due to:_  [x]   Other:_  Session interrupted by 5 minutes due to fire alarm    [employer], PTA 08/13/2020

## 2020-08-15 ENCOUNTER — Inpatient Hospital Stay
Admit: 2020-08-15 | Discharge: 2020-08-15 | Disposition: A | Discharge status: Home / Self Care | Payer: BLUE CROSS/BLUE SHIELD | Attending: Emergency Medicine

## 2020-08-15 DIAGNOSIS — R112 Nausea with vomiting, unspecified: Secondary | ICD-10-CM

## 2020-08-15 LAB — CBC WITH AUTO DIFFERENTIAL
Basophils %: 1 % (ref 0–1)
Basophils Absolute: 0.1 10*3/uL (ref 0.0–0.1)
Eosinophils %: 1 % (ref 0–7)
Eosinophils Absolute: 0.2 10*3/uL (ref 0.0–0.4)
Granulocyte Absolute Count: 0.1 10*3/uL — ABNORMAL HIGH (ref 0.00–0.04)
Hematocrit: 37.3 % (ref 35.0–47.0)
Hemoglobin: 11.6 g/dL (ref 11.5–16.0)
Immature Granulocytes: 0 % (ref 0–0.5)
Lymphocytes %: 30 % (ref 12–49)
Lymphocytes Absolute: 3.4 10*3/uL (ref 0.8–3.5)
MCH: 25.3 PG — ABNORMAL LOW (ref 26.0–34.0)
MCHC: 31.1 g/dL (ref 30.0–36.5)
MCV: 81.3 FL (ref 80.0–99.0)
MPV: 11.7 FL (ref 8.9–12.9)
Monocytes %: 6 % (ref 5–13)
Monocytes Absolute: 0.7 10*3/uL (ref 0.0–1.0)
NRBC Absolute: 0 10*3/uL (ref 0.00–0.01)
Neutrophils %: 61 % (ref 32–75)
Neutrophils Absolute: 7 10*3/uL (ref 1.8–8.0)
Nucleated RBCs: 0 PER 100 WBC
Platelets: 400 10*3/uL (ref 150–400)
RBC: 4.59 M/uL (ref 3.80–5.20)
RDW: 15.2 % — ABNORMAL HIGH (ref 11.5–14.5)
WBC: 11.4 10*3/uL — ABNORMAL HIGH (ref 3.6–11.0)

## 2020-08-15 LAB — COMPREHENSIVE METABOLIC PANEL
ALT: 21 U/L (ref 12–78)
AST: 13 U/L — ABNORMAL LOW (ref 15–37)
Albumin/Globulin Ratio: 0.8 — ABNORMAL LOW (ref 1.1–2.2)
Albumin: 3.6 g/dL (ref 3.5–5.0)
Alkaline Phosphatase: 57 U/L (ref 45–117)
Anion Gap: 11 mmol/L (ref 5–15)
BUN: 15 MG/DL (ref 6–20)
Bun/Cre Ratio: 21 — ABNORMAL HIGH (ref 12–20)
CO2: 25 mmol/L (ref 21–32)
Calcium: 8.6 MG/DL (ref 8.5–10.1)
Chloride: 101 mmol/L (ref 97–108)
Creatinine: 0.7 MG/DL (ref 0.55–1.02)
EGFR IF NonAfrican American: >60 mL/min/{1.73_m2} (ref >60)
GFR African American: >60 mL/min/{1.73_m2} (ref >60)
Globulin: 4.8 g/dL — ABNORMAL HIGH (ref 2.0–4.0)
Glucose: 184 mg/dL — ABNORMAL HIGH (ref 65–100)
Potassium: 3.8 mmol/L (ref 3.5–5.1)
Sodium: 137 mmol/L (ref 136–145)
Total Bilirubin: 0.4 MG/DL (ref 0.2–1.0)
Total Protein: 8.4 g/dL — ABNORMAL HIGH (ref 6.4–8.2)

## 2020-08-15 LAB — URINALYSIS WITH MICROSCOPIC
BACTERIA, URINE: NEGATIVE /hpf
Bilirubin, Urine: NEGATIVE
Blood, Urine: NEGATIVE
Glucose, Ur: 250 mg/dL — AB
Ketones, Urine: NEGATIVE mg/dL
Leukocyte Esterase, Urine: NEGATIVE
Nitrite, Urine: NEGATIVE
Protein, UA: 30 mg/dL — AB
Specific Gravity, UA: >1.03 NA — ABNORMAL HIGH (ref 1.003–1.030)
Urobilinogen, UA, POCT: 0.2 EU/dL (ref 0.2–1.0)
pH, UA: 6 (ref 5.0–8.0)

## 2020-08-15 LAB — TROPONIN, HIGH SENSITIVITY: Troponin, High Sensitivity: 10 ng/L (ref 0–51)

## 2020-08-15 LAB — LIPASE
Lipase: 160 U/L (ref 73–393)
Lipase: 160 U/L (ref 73–393)

## 2020-08-15 LAB — METABOLIC PANEL, COMPREHENSIVE
A-G Ratio: 0.8 — ABNORMAL LOW (ref 1.1–2.2)
ALT (SGPT): 21 U/L (ref 12–78)
AST (SGOT): 13 U/L — ABNORMAL LOW (ref 15–37)
Albumin: 3.6 g/dL (ref 3.5–5.0)
Alk. phosphatase: 57 U/L (ref 45–117)
Anion gap: 11 mmol/L (ref 5–15)
BUN/Creatinine ratio: 21 — ABNORMAL HIGH (ref 12–20)
BUN: 15 MG/DL (ref 6–20)
Bilirubin, total: 0.4 MG/DL (ref 0.2–1.0)
CO2: 25 mmol/L (ref 21–32)
Calcium: 8.6 MG/DL (ref 8.5–10.1)
Chloride: 101 mmol/L (ref 97–108)
Creatinine: 0.7 MG/DL (ref 0.55–1.02)
GFR est AA: >60 mL/min/{1.73_m2} (ref >60)
GFR est non-AA: >60 mL/min/{1.73_m2} (ref >60)
Globulin: 4.8 g/dL — ABNORMAL HIGH (ref 2.0–4.0)
Glucose: 184 mg/dL — ABNORMAL HIGH (ref 65–100)
Potassium: 3.8 mmol/L (ref 3.5–5.1)
Protein, total: 8.4 g/dL — ABNORMAL HIGH (ref 6.4–8.2)
Sodium: 137 mmol/L (ref 136–145)

## 2020-08-15 LAB — CBC WITH AUTOMATED DIFF
ABS. BASOPHILS: 0.1 10*3/uL (ref 0.0–0.1)
ABS. EOSINOPHILS: 0.2 10*3/uL (ref 0.0–0.4)
ABS. IMM. GRANS.: 0.1 10*3/uL — ABNORMAL HIGH (ref 0.00–0.04)
ABS. LYMPHOCYTES: 3.4 10*3/uL (ref 0.8–3.5)
ABS. MONOCYTES: 0.7 10*3/uL (ref 0.0–1.0)
ABS. NEUTROPHILS: 7 10*3/uL (ref 1.8–8.0)
ABSOLUTE NRBC: 0 10*3/uL (ref 0.00–0.01)
BASOPHILS: 1 % (ref 0–1)
EOSINOPHILS: 1 % (ref 0–7)
HCT: 37.3 % (ref 35.0–47.0)
HGB: 11.6 g/dL (ref 11.5–16.0)
IMMATURE GRANULOCYTES: 0 % (ref 0–0.5)
LYMPHOCYTES: 30 % (ref 12–49)
MCH: 25.3 PG — ABNORMAL LOW (ref 26.0–34.0)
MCHC: 31.1 g/dL (ref 30.0–36.5)
MCV: 81.3 FL (ref 80.0–99.0)
MONOCYTES: 6 % (ref 5–13)
MPV: 11.7 FL (ref 8.9–12.9)
NEUTROPHILS: 61 % (ref 32–75)
NRBC: 0 PER 100 WBC
PLATELET: 400 10*3/uL (ref 150–400)
RBC: 4.59 M/uL (ref 3.80–5.20)
RDW: 15.2 % — ABNORMAL HIGH (ref 11.5–14.5)
WBC: 11.4 10*3/uL — ABNORMAL HIGH (ref 3.6–11.0)

## 2020-08-15 LAB — URINALYSIS W/MICROSCOPIC
Bacteria: NEGATIVE /hpf
Bilirubin: NEGATIVE
Blood: NEGATIVE
Glucose: 250 mg/dL — AB
Ketone: NEGATIVE mg/dL
Leukocyte Esterase: NEGATIVE
Nitrites: NEGATIVE
Protein: 30 mg/dL — AB
Specific gravity: >1.03 — ABNORMAL HIGH (ref 1.003–1.030)
Urobilinogen: 0.2 EU/dL (ref 0.2–1.0)
pH (UA): 6 (ref 5.0–8.0)

## 2020-08-15 LAB — TROPONIN-HIGH SENSITIVITY: Troponin-High Sensitivity: 10 ng/L (ref 0–51)

## 2020-08-15 MED ORDER — ONDANSETRON (PF) 4 MG/2 ML INJECTION
4 mg/2 mL | Freq: Once | INTRAMUSCULAR | Status: AC
Start: 2020-08-15 — End: 2020-08-15
  Administered 2020-08-15: 13:00:00 via INTRAVENOUS

## 2020-08-15 MED ORDER — ONDANSETRON 4 MG TAB, RAPID DISSOLVE
4 mg | ORAL_TABLET | Freq: Three times a day (TID) | ORAL | 0 refills | Status: DC | PRN
Start: 2020-08-15 — End: 2021-06-07

## 2020-08-15 MED ORDER — SODIUM CHLORIDE 0.9% BOLUS IV
0.9 % | Freq: Once | INTRAVENOUS | Status: AC
Start: 2020-08-15 — End: 2020-08-15
  Administered 2020-08-15: 13:00:00 via INTRAVENOUS

## 2020-08-15 MED FILL — ONDANSETRON (PF) 4 MG/2 ML INJECTION: 4 mg/2 mL | INTRAMUSCULAR | Qty: 2

## 2020-08-15 MED FILL — SODIUM CHLORIDE 0.9 % IV: INTRAVENOUS | Qty: 1000

## 2020-08-15 NOTE — ED Notes (Signed)
PO challenge with some ginger ale zero

## 2020-08-15 NOTE — ED Notes (Signed)
Informed Dr Cornelia Copa pt did well with PO challenge states that she is feeling better

## 2020-08-15 NOTE — ED Notes (Signed)
Lights dimmed and warm blanket provided for comfort.
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
## 2020-08-15 NOTE — ED Notes (Signed)
Pt ambulates to treatment area she states that this morning after eating some oatmeal she began to feel nauseated then vomited once.  She continues to feel nauseated and states that after vomiting she began with some pain in her chest on the right side that goes around to the her back.

## 2020-08-15 NOTE — ED Notes (Signed)
Pt given discharge instructions by DR Cornelia Copa she verbalizes an understanding pt stable at time of discharge

## 2020-08-17 LAB — EKG 12-LEAD
Atrial Rate: 92 {beats}/min
Diagnosis: NORMAL
P Axis: 35 degrees
P-R Interval: 154 ms
Q-T Interval: 380 ms
QRS Duration: 82 ms
QTc Calculation (Bazett): 469 ms
R Axis: 85 degrees
T Axis: 24 degrees
Ventricular Rate: 92 {beats}/min

## 2020-08-17 LAB — EKG, 12 LEAD, INITIAL
Atrial Rate: 92 {beats}/min
Calculated P Axis: 35 degrees
Calculated R Axis: 85 degrees
Calculated T Axis: 24 degrees
Diagnosis: NORMAL
P-R Interval: 154 ms
Q-T Interval: 380 ms
QRS Duration: 82 ms
QTC Calculation (Bezet): 469 ms
Ventricular Rate: 92 {beats}/min

## 2020-08-21 NOTE — Telephone Encounter (Signed)
 As of 08/20/20 patient has used $527 of the maximum of $600 a year in waived co pays for specific medications and pharmacy-related supplies through the [facility] for the DM Program.    Patient currently enrolled in the DM Program and is nearing the maximum of $600 a year in waived co pays for specific medications and pharmacy-related supplies through the [facility].    Courtesy call placed to patient to advise them of the above information.     Patient unavailable at the time of call. Message left on TAD: Maximum waived co pays for the DM Program has almost been met. Once maximum has been reached, they will begin to be charged their co-payment once again.    I left our number: [phone], option #3 if patient has any questions/concerns.      Caron Shone, CPhT  Population Health Support Specialist   [facility]   Phone: [phone]      For Pharmacy Admin Tracking Only    . Time Spent (min): 5

## 2020-08-25 ENCOUNTER — Encounter: Attending: Rehabilitative and Restorative Service Providers" | Primary: Family Medicine

## 2020-08-25 ENCOUNTER — Telehealth: Admit: 2020-08-25 | Payer: BLUE CROSS/BLUE SHIELD | Primary: Family Medicine

## 2020-08-25 DIAGNOSIS — E1140 Type 2 diabetes mellitus with diabetic neuropathy, unspecified: Secondary | ICD-10-CM

## 2020-08-25 NOTE — Progress Notes (Signed)
Progress Notes by Shary Key, PHARMD at 08/25/20 1130                Author: Shary Key, PHARMD  Service: --  Author Type: Pharmacist       Filed: 08/31/20 1839  Encounter Date: 08/25/2020  Status: Signed          Editor: Shary Key, PHARMD (Pharmacist)               Phone follow-up for diabetes management      S/O: Placed an outgoing call to patient (2 identifiers used) to follow-up on diabetes. Patient notes that things are going really well with  her blood sugars.       Current Diabetes Regimen:   Trulicity 4.5mg  once weekly   Metformin ER 500mg  4 tabs daily   Toujeo Max 80 units once daily      ROS: Patient denies hypoglycemic and hyperglycemic signs/symptoms, chest pain, shortness of breath, neuropathy, vision changes, and any foot  changes.      Blood Sugars:           Diet/Exercise:  She has really changed her diet and is focused on keeping her sugars in goal ranges.       Labs:     Lab Results         Component  Value  Date/Time            Hemoglobin A1c  11.2 (H)  03/27/2020 09:03 AM       Hemoglobin A1c  8.4 (H)  10/14/2019 08:50 AM       Hemoglobin A1c  10.2 (H)  05/13/2019 08:26 AM       Hemoglobin A1c (POC)  10.2  07/29/2020 12:15 PM       Glucose  184 (H)  08/15/2020 09:21 AM       Glucose (POC)  255 (H)  10/26/2018 02:01 AM       Microalbumin/Creat ratio (mg/g creat)  34 (H)  03/27/2020 09:03 AM       Microalbumin,urine random  3.54  03/27/2020 09:03 AM       LDL, calculated  42.6  03/27/2020 09:03 AM            Creatinine  0.70  08/15/2020 09:21 AM              A/P:     1.  Diabetes: a1c not at goal <7% per ADA guidelines however CGM is showing patient  TIR is at goal of >70%. Congratulated patient on this success and encouraged her to continue the positive changes that she's been making with her lifestyle.             Patient verbalized understanding all information presented and advised to contact me with any additional questions or concerns.      Follow up:  09/30/2020         Notification of recommendations will be sent to Dr. Ishmael Holter K for review.      Margo Aye, PharmD, BCPS, CDCES      For Pharmacy Admin Tracking  Only        CPA in place: Yes     Recommendation Provided To:  Patient/Caregiver: 1 via Telephone     Intervention Detail: Scheduled Appointment     Intervention Accepted By:  Patient/Caregiver: 1     Time Spent (min): 30

## 2020-08-26 NOTE — Progress Notes (Signed)
Attempt to reach patient for follow up. Discreet VM left with contact information.   Per chart review, patient had virtual visit with the pharmacist at [facility] on 5/10.

## 2020-08-27 ENCOUNTER — Inpatient Hospital Stay
Admit: 2020-08-27 | Payer: BLUE CROSS/BLUE SHIELD | Attending: Rehabilitative and Restorative Service Providers" | Primary: Family Medicine

## 2020-08-27 DIAGNOSIS — M6208 Separation of muscle (nontraumatic), other site: Secondary | ICD-10-CM

## 2020-08-27 NOTE — Progress Notes (Signed)
Physical Therapy at [facility],   a part of [facility]  [address] [place], [place]  [place], [place]  Phone: [phone]  Fax: [phone]    Discharge Summary  2-15    Patient name: Alyssa [name]  DOB: Feb 21, 1981  Provider#: [id number]  Referral source: Madelin Rear, MD      Medical/Treatment Diagnosis: Diastasis recti [M62.08]  Pain in right knee [M25.561]     Prior Hospitalization: see medical history     Comorbidities: See Plan of Care  Prior Level of Function:See Plan of Care  Medications: Verified on Patient Summary List    Start of Care: 05/12/20      Onset Date:3 months   Visits from Start of Care: 11     Missed Visits: 0  Reporting Period : 05/12/20 to 08/27/20      ASSESSMENT/SUMMARY OF CARE: Met all goals, see last note.  D/C with HEP.            RECOMMENDATIONS:  '[x]' Discontinue therapy: '[x]' Patient has reached or is progressing toward set goals      '[]' Patient is non-compliant or has abdicated      '[]' Due to lack of appreciable progress towards set goals      '[]' Other    Mal Amabile, PT 05/24/2021

## 2020-08-27 NOTE — Progress Notes (Signed)
PT DAILY TREATMENT NOTE 2-15    Patient Name: Alyssa [name]  Date:08/27/2020  DOB: 12-01-1980  [x]   Patient DOB Verified  Payor: BLUE CROSS / Plan: [employer] / Product Type: PPO /    In time: 200 Out time: 245  Total Treatment Time (min): 45  Visit #: 11    Treatment Area: Diastasis recti [M62.08]  Pain in right knee [M25.561]    SUBJECTIVE  Pain Level (0-10 scale): 1/10  Any medication changes, allergies to medications, adverse drug reactions, diagnosis change, or new procedure performed?: [x]  No    []  Yes (see summary sheet for update)  Subjective functional status/changes:   []  No changes reported  Back only hurting her when she sits up from a lying down position    OBJECTIVE    Modality rationale: decrease pain and increase tissue extensibility to improve the patient's ability to perform ADLs   Min Type Additional Details       - Estim: -Att   -Unatt    -TENS instruct                  -IFC  -Premod   -NMES                     -Other:  -w/US   -w/ice   -w/heat  Position:  Location:       -  Traction: - Cervical       -Lumbar                       - Prone          -Supine                       -Intermittent   -Continuous Lbs:  - before manual  - after manual  -w/heat    -  Ultrasound: -Continuous   - Pulsed                       at: -   - Location:  W/cm2:    - Paraffin         Location:   -w/heat   15 -  Ice     -  Heat:post  -  Ice massage Position:reclined-during supine exercises  Location:LB    -  Laser  -  Other: Position:  Location:      -  Vasopneumatic Device Pressure:       - lo - med - hi   Temperature:      - Skin assessment post-treatment:  -intact -redness- no adverse reaction    -redness - adverse reaction:         nt min Neuromuscular Re-education:  [x]   See flow sheet :   Rationale: increase ROM, increase strength, improve coordination and increase proprioception  to improve the patient's ability to perform ADLs without pain    45 min Thereapuetic Exercise:  [x]   See  flow sheet :   Rationale: increase ROM, increase strength, improve coordination and increase proprioception  to improve the patient's ability to perform ADLs without pain            With   []  TE   []  TA   []  Neuro   []  SC   []  other: Patient Education: [x]  Review HEP    []  Progressed/Changed HEP based on:   []  positioning   []  body mechanics   []   transfers   []  heat/ice application    []  other:      Other Objective/Functional Measures:      Reviewed log rolling and diastasis protection      Pain Level (0-10 scale) post treatment: 1/10    ASSESSMENT/Changes in Function: Patient with min increased right knee pain descending second flight stairs during fire alarm.     Patient will continue to benefit from skilled PT services to modify and progress therapeutic interventions, address functional mobility deficits, address ROM deficits, address strength deficits, analyze and address soft tissue restrictions, analyze and cue movement patterns, analyze and modify body mechanics/ergonomics and assess and modify postural abnormalities to attain remaining goals.     []   See Plan of Care  []   See progress note/recertification  []   See Discharge Summary         Progress towards goals / Updated goals: Patient making steady progress towards goals.  Good candidate for D/c in next 2 visits.      Short Term Goals: To be accomplished in 4 weeks:   To reduce pain to <5/10 so patient can walk up down stairs without difficulty Met  To decrease swelling by 1 cm so patient can sit to drive without pain Met  To demonstrate compliance with SIJ protection strategies for transfer in/out car and bed Met  To decrease overall pain to <5/10 so patient can sleep through the night Met  To improve TrA and LE strength by 1 ms grade so that patient can tolerate standing to cook/clean for 30 minutes Met    Long Term Goals: To be accomplished in 12 weeks:75%  To improve LE to 5/5 strength so that patient can perform all work and recreational activities  without pain Nearly Met  Decrease pain <2/10 so patient can walk/stand for 1 hour to perform ADL's and work dutiesPrgoressing  Improve TrA strength to 5/5 so patient can lift/carry groceries, perform household cleaning and work duties Progressing  Improve all special test findings to negative so patient can stand and walk pain free  To increase LE strength to 5/5 without pain so patient can squat to the floor without difficulty  Decrease swelling to minimal so patient can sleep through night with out pain Progressing    PLAN  []   Upgrade activities as tolerated     [x]   Continue plan of care  [x]   Update interventions per flow sheet       []   Discharge due to:_  [x]   Other:_  Session interrupted by 5 minutes due to fire alarm    Christy Gentles, PT,DPT 08/27/2020

## 2020-08-27 NOTE — Progress Notes (Signed)
 Physical Therapy at [facility],   a part of [facility]  [address] [place], [address]  [place], [place]  [address]  Phone: [phone]      Fax:  [phone]    Progress Note    Name: Alyssa [name]   DOB: [birth year]   MD: Dillon, Caitlin K, MD       Treatment Diagnosis: Diastasis recti [M62.08]  Pain in right knee [M25.561]  Start of Care: 05/12/2020    Visits from Start of Care: 11  Missed Visits: 0    Summary of Care:Progress towards goals / Updated goals: Patient making steady progress towards goals.  Good candidate for D/c in next 2 visits.      Short Term Goals: To be accomplished in 4 weeks:   To reduce pain to <5/10 so patient can walk up down stairs without difficulty Met  To decrease swelling by 1 cm so patient can sit to drive without pain Met  To demonstrate compliance with SIJ protection strategies for transfer in/out car and bed Met  To decrease overall pain to <5/10 so patient can sleep through the night Met  To improve TrA and LE strength by 1 ms grade so that patient can tolerate standing to cook/clean for 30 minutes Met    Long Term Goals: To be accomplished in 12 weeks:75%  To improve LE to 5/5 strength so that patient can perform all work and recreational activities without pain Nearly Met  Decrease pain <2/10 so patient can walk/stand for 1 hour to perform ADL's and work dutiesPrgoressing  Improve TrA strength to 5/5 so patient can lift/carry groceries, perform household cleaning and work duties Progressing  Improve all special test findings to negative so patient can stand and walk pain free  To increase LE strength to 5/5 without pain so patient can squat to the floor without difficulty  Decrease swelling to minimal so patient can sleep through night with out pain Progressing      Assessment / Recommendations:           Alyssa [name], PT 08/27/2020
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
## 2020-09-17 ENCOUNTER — Ambulatory Visit: Attending: Family Medicine | Primary: Family Medicine

## 2020-09-17 ENCOUNTER — Ambulatory Visit
Admit: 2020-09-17 | Discharge: 2020-09-17 | Payer: BLUE CROSS/BLUE SHIELD | Attending: Family Medicine | Primary: Family Medicine

## 2020-09-17 DIAGNOSIS — M542 Cervicalgia: Secondary | ICD-10-CM

## 2020-09-17 MED ORDER — MELOXICAM 15 MG TAB
15 mg | ORAL_TABLET | Freq: Every day | ORAL | 1 refills | Status: DC | PRN
Start: 2020-09-17 — End: 2021-01-21

## 2020-09-17 MED ORDER — METHOCARBAMOL 750 MG TAB
750 mg | ORAL_TABLET | Freq: Three times a day (TID) | ORAL | 0 refills | Status: AC | PRN
Start: 2020-09-17 — End: 2021-06-07
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
## 2020-09-30 ENCOUNTER — Telehealth: Primary: Family Medicine

## 2020-09-30 ENCOUNTER — Telehealth: Admit: 2020-09-30 | Payer: BLUE CROSS/BLUE SHIELD | Primary: Family Medicine

## 2020-09-30 DIAGNOSIS — E1140 Type 2 diabetes mellitus with diabetic neuropathy, unspecified: Secondary | ICD-10-CM

## 2020-09-30 NOTE — Progress Notes (Signed)
 Progress Notes by Yolanda Bascom HERO, PHARMD at 09/30/20 1000                Author: Yolanda Bascom HERO, PHARMD  Service: --  Author Type: Pharmacist       Filed: 10/09/20 1851  Encounter Date: 09/30/2020  Status: Signed          Editor: Yolanda Bascom HERO, PHARMD (Pharmacist)               Pharmacist Visit for Diabetes Management & Education      S/O: Alyssa [name] is a 40 y.o.  female referred by Dr. Dillon, Caitlin K, MD  for diabetes management. Phone call for diabetes management. Patient notes that she's having a lot of end of school year stress - trying to manage the stress but knows it is impacting her blood sugars.       Current Diabetes Regimen:   Trulicity  4.5mg  once weekly   Metformin  ER 500mg  4 tabs daily   Toujeo  Max 80 units once daily      ROS:    Patient denies hypoglycemic and hyperglycemic signs/symptoms, chest pain, shortness of breath, neuropathy, vision changes, and any foot changes.      Self-Monitoring Blood Glucose/Continuous Glucose Monitoring:              Data reviewed:     Lab Results         Component  Value  Date/Time            Hemoglobin A1c  11.2 (H)  03/27/2020 09:03 AM       Hemoglobin A1c  8.4 (H)  10/14/2019 08:50 AM       Hemoglobin A1c  10.2 (H)  05/13/2019 08:26 AM       Hemoglobin A1c (POC)  10.2  07/29/2020 12:15 PM       Glucose  184 (H)  08/15/2020 09:21 AM       Glucose (POC)  255 (H)  10/26/2018 02:01 AM       Microalbumin/Creat ratio (mg/g creat)  34 (H)  03/27/2020 09:03 AM       Microalbumin,urine random  3.54  03/27/2020 09:03 AM       LDL, calculated  42.6  03/27/2020 09:03 AM            Creatinine  0.70  08/15/2020 09:21 AM           Diabetes Health Maintenance:   Last eye exam:        Immunizations:     Immunization History        Administered  Date(s) Administered         ?  COVID-19, Pfizer Purple top, DILUTE for use, 12+ yrs, 79mcg/0.3mL dose  09/07/2019, 09/28/2019, 02/11/2020     ?  Influenza Vaccine  01/16/2018, 01/04/2019, 01/11/2020     ?   Influenza Vaccine (Quad) Mdck Pf (>2 Yrs Flucelvax QUAD [id number])  01/04/2019     ?  Influenza Vaccine [employer]) PF (>6 Mo Flulaval, Fluarix, and >3 Yrs Afluria, Fluzone [id number])  02/01/2017     ?  Pneumococcal Polysaccharide (PPSV-23)  03/24/2011         ?  Tdap  06/02/2020           Statin - ACEI/ARB - ASA     Key CAD CHF Meds  lisinopriL  (PRINIVIL , ZESTRIL ) 10 mg tablet  Take 10 mg by mouth daily.           rosuvastatin  (CRESTOR ) 10 mg tablet  Take 1 Tablet by mouth nightly.       lisinopril -hydroCHLOROthiazide (PRINZIDE, ZESTORETIC) 10-12.5 mg per tablet  Take 1 Tablet by mouth Every morning.           omega 3-DHA-EPA-fish oil (Fish OiL) 1,000 mg (120 mg-180 mg) capsule  Take 1 Capsule by mouth daily.                   Assessment/Plan:       1.  Diabetes:  a1c not at goal <7% per ADA guidelines but also not indicative of current blood sugar control. Blood usgars had been trending closer to goal TIR of >70% however they have increased some  recently. Encouraged patient to get back on track with the positive changes she had made and will touch base in a few weeks.         Patient verbalized understanding of the information presented and all of the patients questions were answered.  AVS was handed to the patient and information reviewed.        Patient advised to call me or Dr. Dillon, Caitlin K, MD with any additional questions or concerns.      Follow-up: 10/29/2020         Notification of recommendations will be sent to Dr. Dillon, Caitlin K, MD for review.         Bascom Mouton, PharmD, BCPS, CDCES      For Pharmacy Admin Tracking Only        CPA in place: Yes     Recommendation Provided To:  Patient/Caregiver: 1 via Telephone     Intervention Detail: Scheduled Appointment     Intervention Accepted By:  Patient/Caregiver: 1     Time Spent (min): 30

## 2020-10-09 NOTE — Progress Notes (Signed)
Attempt to reach patient for follow up. Discreet VM left with contact information.     Per chart review, patient has f/up appointment with Dr. Aleene Davidson on 7/19.

## 2020-10-13 ENCOUNTER — Encounter: Attending: Family Medicine | Primary: Family Medicine

## 2020-10-23 NOTE — Progress Notes (Signed)
Attempt to reach patient for follow up. Discreet VM left with contact information. To send lost to follow up letter.    Per chart review, patient has PCP appt scheduled with Dr. Clearence Ped on 7/14 and an appointment with Dr. Aleene Davidson on 7/19.

## 2020-10-29 ENCOUNTER — Telehealth: Primary: Family Medicine

## 2020-10-29 ENCOUNTER — Telehealth: Admit: 2020-10-29 | Payer: BLUE CROSS/BLUE SHIELD | Primary: Family Medicine

## 2020-10-29 DIAGNOSIS — E1140 Type 2 diabetes mellitus with diabetic neuropathy, unspecified: Secondary | ICD-10-CM

## 2020-10-29 NOTE — Progress Notes (Signed)
Follow up Diabetes Phone Call:    S/O: Calling patient to check in. States that things are going okay but hasn't had a sensor in a while. Plans to reorder soon. Trying to get back on track with her diet. Is signed up for be well screening where they will check her a1c.     A/P: Have samples of sensors ready for patient to pick up. Will follow up in a few weeks to see how things are going.    Gaynelle Cage, PharmD, BCPS, [place] in place: Yes  Recommendation Provided To: Patient/Caregiver: 1 via Telephone  Intervention Detail: Patient Access Assistance/Sample Provided  Intervention Accepted By: Patient/Caregiver: 1  Time Spent (min): 30
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
## 2020-11-03 ENCOUNTER — Encounter: Payer: BLUE CROSS/BLUE SHIELD | Attending: Family Medicine | Primary: Family Medicine

## 2020-11-27 NOTE — Progress Notes (Signed)
Attempt to reach patient for follow up. Discreet VM left with contact information.   Patient message saying she will call me back.  Will await call and attempt another outreach if no call back.
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
## 2020-12-14 NOTE — Telephone Encounter (Signed)
Called pt, and she has been scheduled for 12/16/2020 to discuss.

## 2020-12-14 NOTE — Telephone Encounter (Signed)
Patient hurt their foot a week ago, dropped an object on it. It's still swollen and bruised and they're experiencing pain when they walk. They are also reporting a black line on the top of their foot. Patient was hoping we could order an X-ray since they're currently at the emergency room and the imaging center is down the hall. Please call with updates.

## 2020-12-15 LAB — BE WELL HEALTH SCREEN
Cholesterol, Total: 164 MG/DL (ref <200)
Cholesterol, total: 164 MG/DL (ref <200)
Glucose: 269 mg/dL — ABNORMAL HIGH (ref 65–100)
Glucose: 269 mg/dL — ABNORMAL HIGH (ref 65–100)
HDL Cholesterol: 49 MG/DL
HDL: 49 MG/DL
LDL Calculated: 83.2 MG/DL (ref 0–100)
LDL, calculated: 83.2 MG/DL (ref 0–100)
Triglyceride: 159 MG/DL — ABNORMAL HIGH (ref <150)
Triglycerides: 159 MG/DL — ABNORMAL HIGH (ref <150)

## 2020-12-16 ENCOUNTER — Telehealth: Attending: Family Medicine | Primary: Family Medicine

## 2020-12-16 ENCOUNTER — Telehealth
Admit: 2020-12-16 | Discharge: 2020-12-16 | Payer: BLUE CROSS/BLUE SHIELD | Attending: Family Medicine | Primary: Family Medicine

## 2020-12-16 DIAGNOSIS — S99922A Unspecified injury of left foot, initial encounter: Secondary | ICD-10-CM

## 2020-12-16 MED ORDER — TOUJEO MAX U-300 SOLOSTAR 300 UNIT/ML (3 ML) SUBCUTANEOUS INSULIN PEN
300 unit/mL (3 mL) | Freq: Every evening | SUBCUTANEOUS | 3 refills | Status: AC
Start: 2020-12-16 — End: ?
  Filled 2020-12-22: qty 24, 90d supply, fill #0

## 2020-12-16 MED ORDER — LISINOPRIL 20 MG TAB
20 mg | ORAL_TABLET | Freq: Every day | ORAL | 1 refills | Status: DC
Start: 2020-12-16 — End: 2021-03-31

## 2020-12-16 MED ORDER — FREESTYLE LIBRE 2 SENSOR KIT
PACK | 3 refills | Status: DC
Start: 2020-12-16 — End: 2021-03-31

## 2020-12-16 MED ORDER — METFORMIN SR 500 MG 24 HR TABLET
500 mg | ORAL_TABLET | Freq: Every day | ORAL | 3 refills | Status: AC
Start: 2020-12-16 — End: ?
  Filled 2020-12-22: qty 360, 90d supply, fill #0

## 2020-12-16 NOTE — Progress Notes (Signed)
Alyssa [name] is a 40 y.o. female who was seen by synchronous (real-time) audio-video technology on 12/16/2020.      Consent: Alyssa [name], who was seen by synchronous (real-time) audio-video technology, and/or her healthcare decision maker, is aware that this patient-initiated, Telehealth encounter on 12/16/2020 is a billable service, with coverage as determined by her insurance carrier. She is aware that she may receive a bill and has provided verbal consent to proceed: Yes.    Assessment & Plan:   1. Foot trauma, left, initial encounter  Check xr, supportive shoe for now, elevate, ice  - XR FOOT LT MIN 3 V; Future    2. Type 2 diabetes mellitus treated with insulin (HCC)  Not at goal based on recent fasting  Not checking sugar regularly  Out of libres  Out of toujeo (had lantus left over)  Needs to go for a1c then follow up eithe rwith me or tonya for re-tooling of our plan    - metFORMIN ER (GLUCOPHAGE XR) 500 mg tablet; Take 4 Tablets by mouth daily (with dinner).  Dispense: 360 Tablet; Refill: 3  - lisinopriL (PRINIVIL, ZESTRIL) 20 mg tablet; Take 1 Tablet by mouth daily.  Dispense: 30 Tablet; Refill: 1  - insulin glargine U-300 conc (Toujeo Max U-300 SoloStar) 300 unit/mL (3 mL) inpn; 80 Units by SubCUTAneous route nightly. Indications: type 2 diabetes mellitus  Dispense: 24 mL; Refill: 3    3. Essential hypertension  Hctz is giving her urinary freq--req just lisinopril alon  - lisinopriL (PRINIVIL, ZESTRIL) 20 mg tablet; Take 1 Tablet by mouth daily.  Dispense: 30 Tablet; Refill: 1    4. Type 2 diabetes mellitus with hyperglycemia, with long-term current use of insulin (HCC)  As above  - metFORMIN ER (GLUCOPHAGE XR) 500 mg tablet; Take 4 Tablets by mouth daily (with dinner).  Dispense: 360 Tablet; Refill: 3  - XR FOOT LT MIN 3 V; Future  - lisinopriL (PRINIVIL, ZESTRIL) 20 mg tablet; Take 1 Tablet by mouth daily.  Dispense: 30 Tablet; Refill: 1  - insulin glargine U-300 conc (Toujeo Max U-300 SoloStar) 300  unit/mL (3 mL) inpn; 80 Units by SubCUTAneous route nightly. Indications: type 2 diabetes mellitus  Dispense: 24 mL; Refill: 3        Pt was counseled on risks, benefits and alternatives of treatment options. All questions were asked and answered and the patient was agreeable with the treatment plan as outlined.    Subjective:   Alyssa [name] is a 40 y.o. female who was seen for Foot Injury (Left foot pain 7/10. Metal paper towel holder fell on her foot.) and Medication Evaluation (Would like to stop the lisinopril/htcz and go back on just lisinopril.)    Injury date was 8/22    She wants to get an xray for her foot  She says she dropped something metal on her left foot  Has bruising on big toe down to the middle of her foot  A large magnetic metal paper towel holder is what fell on her foot    She can walk but she tells me when she walks she has an escalation in pain to a 6-7/10  When she rests it is 1-2 / 10  She says it sometimes is throbbing    No swelling right now    Diabetes    Sugars controlled poorly  Hypoglycemia: none  Tolerating current treatment moderately  Current medications include   Key Antihyperglycemic Medications  metFORMIN ER (GLUCOPHAGE XR) 500 mg tablet (Taking) Take 4 Tablets by mouth daily (with dinner).    insulin glargine U-300 conc (Toujeo Max U-300 SoloStar) 300 unit/mL (3 mL) inpn (Taking) 80 Units by SubCUTAneous route nightly. Indications: type 2 diabetes mellitus            Lab Results   Component Value Date/Time    Hemoglobin A1c 11.2 (H) 03/27/2020 09:03 AM    Hemoglobin A1c 8.4 (H) 10/14/2019 08:50 AM    Hemoglobin A1c 10.2 (H) 05/13/2019 08:26 AM    Glucose 269 (H) 12/14/2020 10:15 AM    Glucose (POC) 255 (H) 10/26/2018 02:01 AM    Microalbumin/Creat ratio (mg/g creat) 34 (H) 03/27/2020 09:03 AM    Microalbumin,urine random 3.54 03/27/2020 09:03 AM    LDL, calculated 83.2 12/14/2020 10:15 AM    Creatinine 0.70 08/15/2020 09:21 AM     Lab Results   Component Value  Date/Time    Microalbumin/Creat ratio (mg/g creat) 34 (H) 03/27/2020 09:03 AM    Microalbumin,urine random 3.54 03/27/2020 09:03 AM     Also has an elbow cyst that is getting worse in character she wants to consider furthe revaluating        Medications, allergies, PMH, PSH, SOCH, [place] reviewed and updated per routine protocol, see chart for review and changes if not noted here.    ROS  A 12 point review of systems was negative except as noted here or in the HPI.    Objective:   Vital Signs: (As obtained by patient/caregiver at home)  Patient-Reported Vitals 12/16/2020   Patient-Reported Weight 238   Patient-Reported Height -   Patient-Reported Pulse 111   Patient-Reported Temperature -   Patient-Reported SpO2 -   Patient-Reported Systolic  [id number]   Patient-Reported Diastolic 84   Patient-Reported Peak Flow -   Patient-Reported LMP -        [INSTRUCTIONS:  "'[x]'$ " Indicates a positive item  "'[]'$ " Indicates a negative item  -- DELETE ALL ITEMS NOT EXAMINED]    Constitutional: '[x]'$  Appears well-developed and well-nourished '[x]'$  No apparent distress      '[]'$  Abnormal -     Mental status: '[x]'$  Alert and awake  '[x]'$  Oriented to person/place/time '[x]'$  Able to follow commands    '[]'$  Abnormal -     Eyes:   EOM    '[x]'$   Normal    '[]'$  Abnormal -   Sclera  '[x]'$   Normal    '[]'$  Abnormal -          Discharge '[x]'$   None visible   '[]'$  Abnormal -     HENT: '[x]'$  Normocephalic, atraumatic  '[]'$  Abnormal -   '[x]'$  Mouth/Throat: Mucous membranes are moist    External Ears '[x]'$  Normal  '[]'$  Abnormal -    Neck: '[x]'$  No visualized mass '[]'$  Abnormal -     Pulmonary/Chest: '[x]'$  Respiratory effort normal   '[x]'$  No visualized signs of difficulty breathing or respiratory distress        '[]'$  Abnormal -      Musculoskeletal:   '[]'$  Normal gait with no signs of ataxia         '[x]'$  Normal range of motion of neck        '[]'$  Abnormal -     Neurological:        '[x]'$  No Facial Asymmetry (Cranial nerve 7 motor function) (limited exam due to video visit)          '[x]'$  No gaze palsy        '[]'$   Abnormal -          Skin:        '[x]'$  No significant exanthematous lesions or discoloration noted on facial skin         '[]'$  Abnormal -            Psychiatric:       '[x]'$  Normal Affect '[]'$  Abnormal -        '[x]'$  No Hallucinations    Other pertinent observable physical exam findings:seated    We discussed the expected course, resolution and complications of the diagnosis(es) in detail.  Medication risks, benefits, costs, interactions, and alternatives were discussed as indicated.  I advised her to contact the office if her condition worsens, changes or fails to improve as anticipated. She expressed understanding with the diagnosis(es) and plan.       Alyssa [name] is a 40 y.o. female who was evaluated by a video visit encounter for concerns as above. Patient identification was verified prior to start of the visit. A caregiver was present when appropriate. Due to this being a Scientist, physiological (During [id number] public health emergency), evaluation of the following organ systems was limited: Vitals/Constitutional/EENT/Resp/CV/GI/GU/MS/Neuro/Skin/Heme-Lymph-Imm.  Pursuant to the emergency declaration under the [place], 1135 waiver authority and the [employer] Act, this Virtual  Visit was conducted, with patient's (and/or legal guardian's) consent, to reduce the patient's risk of exposure to COVID-19 and provide necessary medical care.     Services were provided through a video synchronous discussion virtually to substitute for in-person clinic visit.   Patient and provider were located at their individual homes.      Doneen Poisson, MD  [facility]  12/16/20 11:41 AM     Portions of this note may have been populated using smart dictation software and may have "sounds-like" errors present.

## 2020-12-16 NOTE — Progress Notes (Signed)
Chief Complaint   Patient presents with    Foot Injury     Left foot pain 7/10. Metal paper towel holder fell on her foot.    Medication Evaluation     Would like to stop the lisinopril/htcz and go back on just lisinopril.     1. Have you been to the ER, urgent care clinic since your last visit?  Hospitalized since your last visit?No    2. Have you seen or consulted any other health care providers outside of the [facility] since your last visit?  Include any pap smears or colon screening. No

## 2020-12-17 ENCOUNTER — Inpatient Hospital Stay: Admit: 2020-12-17 | Payer: BLUE CROSS/BLUE SHIELD | Primary: Family Medicine

## 2020-12-17 DIAGNOSIS — S99922A Unspecified injury of left foot, initial encounter: Secondary | ICD-10-CM

## 2020-12-28 MED FILL — TRULICITY 4.5MG/0.5ML PEN: 4.5 MG/0.5ML | SUBCUTANEOUS | 84 days supply | Qty: 6 | Fill #1 | Status: AC

## 2021-01-01 NOTE — Progress Notes (Signed)
Attempt to reach patient for follow up. Discreet VM left with contact information.    Left message with patient's daughter to have patient call me if she needs assistance with anything.   Will follow up on 10/14.

## 2021-01-11 ENCOUNTER — Encounter

## 2021-01-21 ENCOUNTER — Ambulatory Visit: Primary: Family Medicine

## 2021-01-21 ENCOUNTER — Ambulatory Visit: Admit: 2021-01-21 | Payer: BLUE CROSS/BLUE SHIELD | Primary: Family Medicine

## 2021-01-21 ENCOUNTER — Inpatient Hospital Stay: Admit: 2021-01-21 | Payer: BLUE CROSS/BLUE SHIELD | Attending: Chiropractor | Primary: Family Medicine

## 2021-01-21 DIAGNOSIS — E1140 Type 2 diabetes mellitus with diabetic neuropathy, unspecified: Secondary | ICD-10-CM

## 2021-01-21 DIAGNOSIS — M545 Low back pain, unspecified: Secondary | ICD-10-CM

## 2021-01-21 LAB — AMB POC HEMOGLOBIN A1C
Hemoglobin A1C, POC: 9.4 %
Hemoglobin A1c (POC): 9.4 %

## 2021-01-21 NOTE — Progress Notes (Signed)
 Pharmacy Progress Note - Diabetes Management    Assessment / Plan:   Diabetes Management:  - Per ADA guidelines, Pt's A1c is not at goal of < 7%.   - Current SMBG(s)/CGM trend unable to be evaluated as patient not checking  - We couldn't apply herlene today because patient is getting MRI  - POC a1c checked today  - Patient to continue current regimen and start using libre so we can better adjust her medications and do it in the best way to help improve her glucose control    Checked a1c today - 9.4%    Follow up: 02/03/2021       S/O: Alyssa [name] is a 40 y.o. female referred by Dr. Dillon, Caitlin K, MD for diabetes management.     Recently increased lisinopril  to 20mg  once daily    Current diabetes regimen include(s):    Toujeo  Max - 80 units once daily   Metformin  ER 500mg  4 tabs daily  Trulicity  4.5mg  once weekly    ROS:  Today, Pt endorses:  - Symptoms of Hyperglycemia: none  - Symptoms of Hypoglycemia: none    Blood Glucose Monitoring (BGM) or CGM:  Not checking lately - she has re-ordered libre to start using this again      The 10-year ASCVD risk score (Arnett DK, et al., 2019) is: 5.7%    Values used to calculate the score:      Age: 40 years      Sex: Female      Is Non-Hispanic African American: Yes      Diabetic: Yes      Tobacco smoker: No      Systolic Blood Pressure: 136 mmHg      Is BP treated: Yes      HDL Cholesterol: 49 MG/DL      Total Cholesterol: 164 MG/DL     Vitals:  Wt Readings from Last 3 Encounters:   09/17/20 273 lb (123.8 kg)   08/15/20 280 lb 3.3 oz (127.1 kg)   07/14/20 282 lb (127.9 kg)     BP Readings from Last 3 Encounters:   09/17/20 136/76   08/15/20 133/83   07/14/20 (!) 157/96     Pulse Readings from Last 3 Encounters:   09/17/20 76   08/15/20 98   07/14/20 (!) 114       Lab Results   Component Value Date/Time    Hemoglobin A1c 11.2 (H) 03/27/2020 09:03 AM    Hemoglobin A1c 8.4 (H) 10/14/2019 08:50 AM    Hemoglobin A1c 10.2 (H) 05/13/2019 08:26 AM    Hemoglobin A1c (POC) 9.4  01/21/2021 12:11 PM    Hemoglobin A1c (POC) 10.2 07/29/2020 12:15 PM    Glucose 269 (H) 12/14/2020 10:15 AM    Glucose (POC) 255 (H) 10/26/2018 02:01 AM    Microalbumin/Creat ratio (mg/g creat) 34 (H) 03/27/2020 09:03 AM    Microalbumin,urine random 3.54 03/27/2020 09:03 AM    LDL, calculated 83.2 12/14/2020 10:15 AM    Creatinine 0.70 08/15/2020 09:21 AM         CrCl cannot be calculated (Unknown ideal weight.).      Medication reconciliation was completed during the visit.    Medications Discontinued During This Encounter   Medication Reason    meloxicam (MOBIC) 15 mg tablet Therapy Completed    biotin (VITAMIN B7) 5 mg tablet LIST CLEANUP       Patient verbalized understanding of the information presented and all of the patient's questions were  answered.  AVS was handed to the patient. Patient advised to call the office with any additional questions or concerns.    Thank you for the consult,  Bascom Mouton, PharmD, BCPS, CDCES      For Pharmacy Admin Tracking Only    CPA in place: Yes  Recommendation Provided To: Patient/Caregiver: 3 via In person  Intervention Detail: Lab(s) Ordered, Patient Access Assistance/Sample Provided, and Scheduled Appointment  Intervention Accepted By: Patient/Caregiver: 3  Time Spent (min): 30

## 2021-01-21 NOTE — Progress Notes (Signed)
Formatting of this note is different from the original.  Pharmacy Progress Note - Diabetes Management    Assessment / Plan:   Diabetes Management:  - Per ADA guidelines, Pt's A1c is not at goal of < 7%.   - Current SMBG(s)/CGM trend unable to be evaluated as patient not checking  - We couldn't apply Elenor Legato today because patient is getting MRI  - POC a1c checked today  - Patient to continue current regimen and start using libre so we can better adjust her medications and do it in the best way to help improve her glucose control    Checked a1c today - 9.4%    Follow up: 02/03/2021      S/O: Alyssa [name] is a 40 y.o. female referred by Dr. Aleene Davidson, Colvin Caroli, MD for diabetes management.     Recently increased lisinopril to 20mg  once daily    Current diabetes regimen include(s):    Toujeo Max - 80 units once daily   Metformin ER 500mg  4 tabs daily  Trulicity 4.5mg  once weekly    ROS:  Today, Pt endorses:  - Symptoms of Hyperglycemia: none  - Symptoms of Hypoglycemia: none    Blood Glucose Monitoring (BGM) or CGM:  Not checking lately - she has re-ordered libre to start using this again    The 10-year ASCVD risk score (Arnett DK, et al., 2019) is: 5.7%    Values used to calculate the score:      Age: 67 years      Sex: Female      Is Non-Hispanic African American: Yes      Diabetic: Yes      Tobacco smoker: No      Systolic Blood Pressure: 188 mmHg      Is BP treated: Yes      HDL Cholesterol: 49 MG/DL      Total Cholesterol: 164 MG/DL     Vitals:  Wt Readings from Last 3 Encounters:   09/17/20 273 lb (123.8 kg)   08/15/20 280 lb 3.3 oz (127.1 kg)   07/14/20 282 lb (127.9 kg)     BP Readings from Last 3 Encounters:   09/17/20 136/76   08/15/20 133/83   07/14/20 (!) 157/96     Pulse Readings from Last 3 Encounters:   09/17/20 76   08/15/20 98   07/14/20 (!) 114     Lab Results   Component Value Date/Time    Hemoglobin A1c 11.2 (H) 03/27/2020 09:03 AM    Hemoglobin A1c 8.4 (H) 10/14/2019 08:50 AM    Hemoglobin A1c 10.2 (H)  05/13/2019 08:26 AM    Hemoglobin A1c (POC) 9.4 01/21/2021 12:11 PM    Hemoglobin A1c (POC) 10.2 07/29/2020 12:15 PM    Glucose 269 (H) 12/14/2020 10:15 AM    Glucose (POC) 255 (H) 10/26/2018 02:01 AM    Microalbumin/Creat ratio (mg/g creat) 34 (H) 03/27/2020 09:03 AM    Microalbumin,urine random 3.54 03/27/2020 09:03 AM    LDL, calculated 83.2 12/14/2020 10:15 AM    Creatinine 0.70 08/15/2020 09:21 AM     CrCl cannot be calculated (Unknown ideal weight.).    Medication reconciliation was completed during the visit.    Medications Discontinued During This Encounter   Medication Reason    meloxicam (MOBIC) 15 mg tablet Therapy Completed    biotin (VITAMIN B7) 5 mg tablet LIST CLEANUP     Patient verbalized understanding of the information presented and all of the patient?s questions were answered.  AVS  was handed to the patient. Patient advised to call the office with any additional questions or concerns.    Thank you for the consult,  Gaynelle Cage, PharmD, BCPS, [place] in place: Yes  Recommendation Provided To: Patient/Caregiver: 3 via In person  Intervention Detail: Lab(s) Ordered, Patient Access Assistance/Sample Provided, and Scheduled Appointment  Intervention Accepted By: Patient/Caregiver: 3  Time Spent (min): 30    Electronically signed by Eino Farber, PHARMD at 02/04/2021 11:38 AM EDT

## 2021-01-28 MED FILL — ROSUVASTATIN CALCIUM 10MG TABS: 10 mg | 90 days supply | Qty: 90 | Fill #1 | Status: AC

## 2021-01-29 NOTE — Progress Notes (Signed)
Outreach to patient, patient answered the line stating she would call me back.  Will await call back.  Per chart review, patient had A1C drawn on 10/6, down to 9.4 (from 10.2).

## 2021-02-03 ENCOUNTER — Ambulatory Visit: Admit: 2021-02-03 | Payer: BLUE CROSS/BLUE SHIELD | Primary: Family Medicine

## 2021-02-12 NOTE — Progress Notes (Signed)
Resolving current episode of case management.  Patient has met patient stated and/or medical goals.      Patient consistenly demonstrates understanding of the medical action plan through execution of plan. Appointments with key providers are scheduled and attended. Plan of care is modified and updated to address new challenges and barriers with minimal support from the CM team(proactively uses physicians and community resources) The support system remains current and has been modified as needed.Patient continues to acquire needed resources from the current support system established. Teach back demonstrates that patient understands education for self management of chronic conditions. Patient consistenly communicates understanding of signs,symptoms and complications for all major diagnoses.   Patient modifies his/her lifestyle toreduce or avoid risk factors to his/her health.     ECM will follow as needed and patient has ECM contact information for future needs.    Patient states she is doing well.  Feels comfortable with dietary education that has been provided by this ACM.  A1C was down to 9.4 this month.  Patient is comfortable with moving forward with graduation from the program.  Encouraged her to reach out to me should she need assistance in the future.  Patient was appreciative.

## 2021-02-23 ENCOUNTER — Inpatient Hospital Stay
Admit: 2021-02-23 | Discharge: 2021-03-09 | Disposition: A | Discharge status: Home / Self Care | Payer: BLUE CROSS/BLUE SHIELD | Attending: Surgery | Admitting: Surgery

## 2021-02-23 ENCOUNTER — Emergency Department: Admit: 2021-02-23 | Payer: BLUE CROSS/BLUE SHIELD | Primary: Family Medicine

## 2021-02-23 DIAGNOSIS — R1084 Generalized abdominal pain: Secondary | ICD-10-CM

## 2021-02-23 DIAGNOSIS — K565 Intestinal adhesions [bands], unspecified as to partial versus complete obstruction: Secondary | ICD-10-CM

## 2021-02-23 LAB — CBC WITH AUTO DIFFERENTIAL
Basophils %: 1 % (ref 0–1)
Basophils Absolute: 0.1 10*3/uL (ref 0.0–0.1)
Eosinophils %: 1 % (ref 0–7)
Eosinophils Absolute: 0.1 10*3/uL (ref 0.0–0.4)
Granulocyte Absolute Count: 0 10*3/uL (ref 0.00–0.04)
Hematocrit: 40.9 % (ref 35.0–47.0)
Hemoglobin: 13 g/dL (ref 11.5–16.0)
Immature Granulocytes: 0 % (ref 0–0.5)
Lymphocytes %: 35 % (ref 12–49)
Lymphocytes Absolute: 3.8 10*3/uL — ABNORMAL HIGH (ref 0.8–3.5)
MCH: 25.5 PG — ABNORMAL LOW (ref 26.0–34.0)
MCHC: 31.8 g/dL (ref 30.0–36.5)
MCV: 80.4 FL (ref 80.0–99.0)
MPV: 11.4 FL (ref 8.9–12.9)
Monocytes %: 6 % (ref 5–13)
Monocytes Absolute: 0.6 10*3/uL (ref 0.0–1.0)
NRBC Absolute: 0 10*3/uL (ref 0.00–0.01)
Neutrophils %: 58 % (ref 32–75)
Neutrophils Absolute: 6.2 10*3/uL (ref 1.8–8.0)
Nucleated RBCs: 0 PER 100 WBC
Platelets: 461 10*3/uL — ABNORMAL HIGH (ref 150–400)
RBC: 5.09 M/uL (ref 3.80–5.20)
RDW: 15.2 % — ABNORMAL HIGH (ref 11.5–14.5)
WBC: 10.8 10*3/uL (ref 3.6–11.0)

## 2021-02-23 LAB — URINALYSIS WITH MICROSCOPIC
Bilirubin, Urine: NEGATIVE
Blood, Urine: NEGATIVE
Glucose, Ur: NEGATIVE mg/dL
Ketones, Urine: NEGATIVE mg/dL
Leukocyte Esterase, Urine: NEGATIVE
Nitrite, Urine: NEGATIVE
Protein, UA: 100 mg/dL — AB
Specific Gravity, UA: 1.01 (ref 1.003–1.030)
Urobilinogen, UA, POCT: 0.2 EU/dL (ref 0.2–1.0)
pH, UA: 7 (ref 5.0–8.0)

## 2021-02-23 LAB — BASIC METABOLIC PANEL
Anion Gap: 10 mmol/L (ref 5–15)
BUN: 8 MG/DL (ref 6–20)
Bun/Cre Ratio: 12 (ref 12–20)
CO2: 26 mmol/L (ref 21–32)
Calcium: 9.5 MG/DL (ref 8.5–10.1)
Chloride: 99 mmol/L (ref 97–108)
Creatinine: 0.66 MG/DL (ref 0.55–1.02)
ESTIMATED GLOMERULAR FILTRATION RATE: >60 mL/min/{1.73_m2} (ref >60)
Glucose: 156 mg/dL — ABNORMAL HIGH (ref 65–100)
Potassium: 4.7 mmol/L (ref 3.5–5.1)
Sodium: 135 mmol/L — ABNORMAL LOW (ref 136–145)

## 2021-02-23 LAB — LIPASE
Lipase: 106 U/L (ref 73–393)
Lipase: 106 U/L (ref 73–393)

## 2021-02-23 LAB — CBC WITH AUTOMATED DIFF
ABS. BASOPHILS: 0.1 10*3/uL (ref 0.0–0.1)
ABS. EOSINOPHILS: 0.1 10*3/uL (ref 0.0–0.4)
ABS. IMM. GRANS.: 0 10*3/uL (ref 0.00–0.04)
ABS. LYMPHOCYTES: 3.8 10*3/uL — ABNORMAL HIGH (ref 0.8–3.5)
ABS. MONOCYTES: 0.6 10*3/uL (ref 0.0–1.0)
ABS. NEUTROPHILS: 6.2 10*3/uL (ref 1.8–8.0)
ABSOLUTE NRBC: 0 10*3/uL (ref 0.00–0.01)
BASOPHILS: 1 % (ref 0–1)
EOSINOPHILS: 1 % (ref 0–7)
HCT: 40.9 % (ref 35.0–47.0)
HGB: 13 g/dL (ref 11.5–16.0)
IMMATURE GRANULOCYTES: 0 % (ref 0–0.5)
LYMPHOCYTES: 35 % (ref 12–49)
MCH: 25.5 PG — ABNORMAL LOW (ref 26.0–34.0)
MCHC: 31.8 g/dL (ref 30.0–36.5)
MCV: 80.4 FL (ref 80.0–99.0)
MONOCYTES: 6 % (ref 5–13)
MPV: 11.4 FL (ref 8.9–12.9)
NEUTROPHILS: 58 % (ref 32–75)
NRBC: 0 PER 100 WBC
PLATELET: 461 10*3/uL — ABNORMAL HIGH (ref 150–400)
RBC: 5.09 M/uL (ref 3.80–5.20)
RDW: 15.2 % — ABNORMAL HIGH (ref 11.5–14.5)
WBC: 10.8 10*3/uL (ref 3.6–11.0)

## 2021-02-23 LAB — METABOLIC PANEL, BASIC
Anion gap: 10 mmol/L (ref 5–15)
BUN/Creatinine ratio: 12 (ref 12–20)
BUN: 8 MG/DL (ref 6–20)
CO2: 26 mmol/L (ref 21–32)
Calcium: 9.5 MG/DL (ref 8.5–10.1)
Chloride: 99 mmol/L (ref 97–108)
Creatinine: 0.66 MG/DL (ref 0.55–1.02)
Glucose: 156 mg/dL — ABNORMAL HIGH (ref 65–100)
Potassium: 4.7 mmol/L (ref 3.5–5.1)
Sodium: 135 mmol/L — ABNORMAL LOW (ref 136–145)
eGFR: >60 mL/min/{1.73_m2} (ref >60)

## 2021-02-23 LAB — URINALYSIS W/MICROSCOPIC
Bilirubin: NEGATIVE
Blood: NEGATIVE
Glucose: NEGATIVE mg/dL
Ketone: NEGATIVE mg/dL
Leukocyte Esterase: NEGATIVE
Nitrites: NEGATIVE
Protein: 100 mg/dL — AB
Specific gravity: 1.01 (ref 1.003–1.030)
Urobilinogen: 0.2 EU/dL (ref 0.2–1.0)
pH (UA): 7 (ref 5.0–8.0)

## 2021-02-23 LAB — URINE CULTURE HOLD SAMPLE

## 2021-02-23 MED ORDER — HEPARIN (PORCINE) 5,000 UNIT/ML IJ SOLN
5000 unit/mL | Freq: Three times a day (TID) | INTRAMUSCULAR | Status: DC
Start: 2021-02-23 — End: 2021-03-09
  Administered 2021-02-24 – 2021-03-09 (×41): via SUBCUTANEOUS

## 2021-02-23 MED ORDER — ONDANSETRON (PF) 4 MG/2 ML INJECTION
4 mg/2 mL | INTRAMUSCULAR | Status: DC | PRN
Start: 2021-02-23 — End: 2021-03-09
  Administered 2021-02-24 – 2021-03-08 (×17): via INTRAVENOUS

## 2021-02-23 MED ORDER — MORPHINE 4 MG/ML INTRAVENOUS SOLUTION
4 mg/mL | INTRAVENOUS | Status: AC
Start: 2021-02-23 — End: 2021-02-23
  Administered 2021-02-23: 21:00:00 via INTRAVENOUS

## 2021-02-23 MED ORDER — HYDROMORPHONE 0.5 MG/0.5 ML SYRINGE
0.5 mg/ mL | INTRAMUSCULAR | Status: AC
Start: 2021-02-23 — End: 2021-02-23
  Administered 2021-02-24: via INTRAVENOUS

## 2021-02-23 MED ORDER — PIPERACILLIN-TAZOBACTAM 3.375 GRAM IV SOLR
3.375 gram | Freq: Three times a day (TID) | INTRAVENOUS | Status: DC
Start: 2021-02-23 — End: 2021-03-03
  Administered 2021-02-25 – 2021-03-03 (×20): via INTRAVENOUS

## 2021-02-23 MED ORDER — ONDANSETRON (PF) 4 MG/2 ML INJECTION
4 mg/2 mL | INTRAMUSCULAR | Status: AC
Start: 2021-02-23 — End: 2021-02-23
  Administered 2021-02-23: 19:00:00 via INTRAVENOUS

## 2021-02-23 MED ADMIN — piperacillin-tazobactam (ZOSYN) 4.5 g in 0.9% sodium chloride (MBP/ADV) 100 mL MBP: INTRAVENOUS | @ 21:00:00 | NDC 65219025915

## 2021-02-23 MED ADMIN — morphine injection 2 mg: INTRAVENOUS | @ 19:00:00 | NDC 63323045200

## 2021-02-23 MED ADMIN — sodium chloride 0.9 % bolus infusion 1,000 mL: INTRAVENOUS | @ 19:00:00 | NDC 00338004904

## 2021-02-23 MED ADMIN — ketorolac (TORADOL) injection 15 mg: INTRAVENOUS | @ 21:00:00 | NDC 63323016243

## 2021-02-23 MED ADMIN — iopamidoL (ISOVUE-370) 76 % injection 100 mL: INTRAVENOUS | @ 19:00:00 | NDC 00270131635

## 2021-02-23 MED FILL — MORPHINE 2 MG/ML INJECTION: 2 mg/mL | INTRAMUSCULAR | Qty: 1

## 2021-02-23 MED FILL — PIPERACILLIN-TAZOBACTAM 4.5 GRAM IV SOLR: 4.5 gram | INTRAVENOUS | Qty: 4.5

## 2021-02-23 MED FILL — KETOROLAC TROMETHAMINE 30 MG/ML INJECTION: 30 mg/mL (1 mL) | INTRAMUSCULAR | Qty: 1

## 2021-02-23 MED FILL — ONDANSETRON (PF) 4 MG/2 ML INJECTION: 4 mg/2 mL | INTRAMUSCULAR | Qty: 2

## 2021-02-23 MED FILL — SODIUM CHLORIDE 0.9 % IV: INTRAVENOUS | Qty: 1000

## 2021-02-23 MED FILL — MORPHINE 4 MG/ML SYRINGE: 4 mg/mL | INTRAMUSCULAR | Qty: 1

## 2021-02-23 NOTE — ED Notes (Signed)
Patient in no obvious distress respirations even and unlabored pain under control, family at bedside

## 2021-02-23 NOTE — ED Notes (Signed)
C/o abdominal pain with nausea states vomited once unable to triage vitals at this time because patient is flailing around on the stretcher

## 2021-02-23 NOTE — ED Notes (Signed)
TRANSFER - OUT REPORT:    Verbal report given to Rosendo Gros RN(name) on Dalylah Kammeyer  being transferred to [facility] 4th floor(unit) for routine progression of care       Report consisted of patient's Situation, Background, Assessment and   Recommendations(SBAR).     Information from the following report(s) ED Summary was reviewed with the receiving nurse.    Lines:   Peripheral IV 02/23/21 Anterior;Left;Proximal Forearm (Active)        Opportunity for questions and clarification was provided.

## 2021-02-23 NOTE — Progress Notes (Signed)
2254 Patient still complaining of pain 8/10.  Toradol given, no prn medication.  Called [facility] Oncall.    At  2330 noted orders for prn Dilaudid.    At 2340 compliants of pain, Dilaudid given.  Patient needs attended  At 0300 patient complaints of pain, due toradol given.  Patient verbalized relief of pain, kept nil per orem .    At 0610 stable and not in distress/  needs attended.  Phlebotomst tried to take labs but unsuccessful./    Bedside shift change report given to [employer] (Soil scientist) by Michele Mcalpine (offgoing nurse). Report included the following information SBAR, Kardex, Intake/Output, MAR, and Recent Results.

## 2021-02-23 NOTE — ED Notes (Signed)
Updated patient on room assignment and eta for [facility], reports pain manageable
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
## 2021-02-23 NOTE — H&P (Signed)
Admitted to general surgery, SBO vs enteritis    Arnetha Courser, MD

## 2021-02-23 NOTE — ED Notes (Signed)
Patient return from CT in no acute distress no vomiting

## 2021-02-23 NOTE — Op Note (Signed)
[place]  OPERATIVE REPORT    Name:  JASIA, HILTUNEN  MR#:  [id number]  DOB:  Jun 14, 1980  ACCOUNT #:  [id number]  DATE OF SERVICE:  02/26/2021    PRIMARY CARE PROVIDER:  Madelin Rear, MD    PREOPERATIVE DIAGNOSIS:  Small bowel obstruction.    POSTOPERATIVE DIAGNOSIS:  Small bowel obstruction.    PROCEDURES PERFORMED:  1.  Diagnostic laparoscopy converted to laparotomy.  2.  Small bowel resection x2.  3.  Extensive lysis of adhesions, 90 minutes.    SURGEON:  Arnetha Courser, MD.    ASSISTANT:  See record.    ANESTHESIA:  General and local.    COMPLICATIONS:  None.    SPECIMENS REMOVED:  Small bowel x3.    IMPLANTS:  n/a.    ESTIMATED BLOOD LOSS:  400 mL.    FINDINGS:  Extensive adhesive disease to small bowel    INDICATIONS:  The patient is a 40 year old female status post remote history of laparoscopic hernia repair with mesh.  The patient came to the hospital with a refractory small bowel obstruction did not improve after several days of nonoperative management.  She was taken to the operating room for exploration.    PROCEDURE:  Consent was obtained.  She was taken to the operating room and placed in supine position.  SCDs were turned on and working.  Foley catheter was placed.  After successful induction of general endotracheal anesthesia, the abdomen was prepped and draped in usual sterile fashion with the left arm tucked.  Time-out was performed per protocol.  The patient was on therapeutic antibiotics.  The abdomen was entered under direct camera vision with a clear tissue dissection trocar.  Insufflation was established and maintained at 15 mmHg.  Two ports were placed along the left abdominal side wall under direct camera vision approximately 10 cm away from each other.  The patient was placed in 10 degrees of reverse Trendelenburg.  Adhesiolysis began by taking the omentum from the anterior abdominal wall.  There were bowel loops that was stuck to the edge of the  intraperitoneal mesh.  As I tried to continue with adhesiolysis it was clear that there was at least one enterotomy that had to be repaired.  To that end, a decision was made to end laparoscopy and converted to laparotomy.      A generous inframumbilical midline skin incision was made with a skin knife down to the fascia.  I was able to carefully separate the small bowel from the anterior abdominal wall, but this took over 90 minutes of tedious, careful adhesiolysis with scissors.  Once freed, I was then able to run the bowel from the ligament of Treitz to the ileal rudder.      In total, there were several prominent areas of adhesive disease of the small bowel to mesh, adhesive disease of small bowel to the sigmoid colon mesentery and adhesive disease of small bowel to right pelvis sidewall where the patient had prior appendectomy.  There were two contiguous areas of concern where the small bowel was irretrievably compromised with enterotomies.  There was also another section of denuded small bowel with serosa was torn in two places making the bowel very thin and at high-risk for spontaneous perforation.  Two small bowel resections were performed in usual fashion.  The mesentery was controlled with a vessel sealing device.  Bowel was placed in a side to side function as an end-to-end isoperistaltic anastomosis.  One  end of the cut bowel was opened with scissors and the other end of the bowel adjacent to the staple line was opened with electrocautery.  A 45-mm Blue staple was placed across the enterotomies and fired, creating a common channel.  Common channel was closed with a running 3-0 unidirectional PDS stitch.  The mesenteric defect was closed with a 3-0 silk suture.  This was performed second specimen of denuded small bowel in same fashion with small bowel being resected; the mesentery controlled with vessel sealing device, stay sutures being in place, cut end of the bowel being opened with curved mayo  scissor, enterotomy made with electrocautery, the two openings of the small bowel were intubated with a 60 mm Blue staple load and fired creating a common channel and closed in usual fashion.  The closure of both of the common channels were full-thickness seromuscular bites x2.  Full-thickness seromuscular bites with a 3-0 unidirectional V-Loc followed by imbricating Lembert style stitches with 3-0 silk suture.  Bowel was run again from, there were no other enterotomies. No injuries to be adjacent colon.  The abdomen was irrigated with a warmed saline x 2 liters.  Exparel expanded with 0.5% Marcaine plain and was injected to the abdominal wall.  Seprafilm was placed against the affected surfaces of the pelvis and small bowel to hopefully prevent adhesion in the future.  A layer of Seprafilm was also  placed adjacent to the omentum that was abutting the created fascial defect.      The gloves and gowns were exchanged and we brought in closing table.  The fascial defect was closed with running 0 Prolene stitch x2, given history of prior mesh hernia repair.  Subcutaneous tissue and the skin were reapproximated with 2-0 Vicryl stitch.  The skin was closed with staples.  Dressings were placed.  The patient's NG tube placed during the procedure remain as well as the Foley catheter.  She tolerated the procedure well.      Arnetha Courser, MD      [id number]  D:  02/26/2021 22:09  T:  02/27/2021 8:32  JOB #:  [id number]  CC:  Madelin Rear, MD
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
## 2021-02-24 LAB — BASIC METABOLIC PANEL
Anion Gap: 6 mmol/L (ref 5–15)
BUN: 9 MG/DL (ref 6–20)
Bun/Cre Ratio: 14 (ref 12–20)
CO2: 28 mmol/L (ref 21–32)
Calcium: 9.1 MG/DL (ref 8.5–10.1)
Chloride: 104 mmol/L (ref 97–108)
Creatinine: 0.66 MG/DL (ref 0.55–1.02)
ESTIMATED GLOMERULAR FILTRATION RATE: >60 mL/min/{1.73_m2} (ref >60)
Glucose: 119 mg/dL — ABNORMAL HIGH (ref 65–100)
Potassium: 3.4 mmol/L — ABNORMAL LOW (ref 3.5–5.1)
Sodium: 138 mmol/L (ref 136–145)

## 2021-02-24 LAB — CBC WITH AUTO DIFFERENTIAL
Basophils %: 1 % (ref 0–1)
Basophils Absolute: 0.1 10*3/uL (ref 0.0–0.1)
Eosinophils %: 1 % (ref 0–7)
Eosinophils Absolute: 0.1 10*3/uL (ref 0.0–0.4)
Granulocyte Absolute Count: 0 10*3/uL (ref 0.00–0.04)
Hematocrit: 37.2 % (ref 35.0–47.0)
Hemoglobin: 11.6 g/dL (ref 11.5–16.0)
Immature Granulocytes: 1 % — ABNORMAL HIGH (ref 0.0–0.5)
Lymphocytes %: 39 % (ref 12–49)
Lymphocytes Absolute: 3.3 10*3/uL (ref 0.8–3.5)
MCH: 25.4 PG — ABNORMAL LOW (ref 26.0–34.0)
MCHC: 31.2 g/dL (ref 30.0–36.5)
MCV: 81.4 FL (ref 80.0–99.0)
MPV: 11 FL (ref 8.9–12.9)
Monocytes %: 7 % (ref 5–13)
Monocytes Absolute: 0.6 10*3/uL (ref 0.0–1.0)
NRBC Absolute: 0 10*3/uL (ref 0.00–0.01)
Neutrophils %: 51 % (ref 32–75)
Neutrophils Absolute: 4.3 10*3/uL (ref 1.8–8.0)
Nucleated RBCs: 0 PER 100 WBC
Platelets: 421 10*3/uL — ABNORMAL HIGH (ref 150–400)
RBC: 4.57 M/uL (ref 3.80–5.20)
RDW: 15.4 % — ABNORMAL HIGH (ref 11.5–14.5)
WBC: 8.4 10*3/uL (ref 3.6–11.0)

## 2021-02-24 LAB — POCT GLUCOSE
POC Glucose: 135 mg/dL — ABNORMAL HIGH (ref 65–117)
POC Glucose: 124 mg/dL — ABNORMAL HIGH (ref 65–117)
POC Glucose: 110 mg/dL (ref 65–117)

## 2021-02-24 LAB — GLUCOSE, POC
Glucose (POC): 135 mg/dL — ABNORMAL HIGH (ref 65–117)
Glucose (POC): 124 mg/dL — ABNORMAL HIGH (ref 65–117)
Glucose (POC): 110 mg/dL (ref 65–117)

## 2021-02-24 LAB — METABOLIC PANEL, BASIC
Anion gap: 6 mmol/L (ref 5–15)
BUN/Creatinine ratio: 14 (ref 12–20)
BUN: 9 MG/DL (ref 6–20)
CO2: 28 mmol/L (ref 21–32)
Calcium: 9.1 MG/DL (ref 8.5–10.1)
Chloride: 104 mmol/L (ref 97–108)
Creatinine: 0.66 MG/DL (ref 0.55–1.02)
Glucose: 119 mg/dL — ABNORMAL HIGH (ref 65–100)
Potassium: 3.4 mmol/L — ABNORMAL LOW (ref 3.5–5.1)
Sodium: 138 mmol/L (ref 136–145)
eGFR: >60 mL/min/{1.73_m2} (ref >60)

## 2021-02-24 LAB — CBC WITH AUTOMATED DIFF
ABS. BASOPHILS: 0.1 10*3/uL (ref 0.0–0.1)
ABS. EOSINOPHILS: 0.1 10*3/uL (ref 0.0–0.4)
ABS. IMM. GRANS.: 0 10*3/uL (ref 0.00–0.04)
ABS. LYMPHOCYTES: 3.3 10*3/uL (ref 0.8–3.5)
ABS. MONOCYTES: 0.6 10*3/uL (ref 0.0–1.0)
ABS. NEUTROPHILS: 4.3 10*3/uL (ref 1.8–8.0)
ABSOLUTE NRBC: 0 10*3/uL (ref 0.00–0.01)
BASOPHILS: 1 % (ref 0–1)
EOSINOPHILS: 1 % (ref 0–7)
HCT: 37.2 % (ref 35.0–47.0)
HGB: 11.6 g/dL (ref 11.5–16.0)
IMMATURE GRANULOCYTES: 1 % — ABNORMAL HIGH (ref 0.0–0.5)
LYMPHOCYTES: 39 % (ref 12–49)
MCH: 25.4 PG — ABNORMAL LOW (ref 26.0–34.0)
MCHC: 31.2 g/dL (ref 30.0–36.5)
MCV: 81.4 FL (ref 80.0–99.0)
MONOCYTES: 7 % (ref 5–13)
MPV: 11 FL (ref 8.9–12.9)
NEUTROPHILS: 51 % (ref 32–75)
NRBC: 0 PER 100 WBC
PLATELET: 421 10*3/uL — ABNORMAL HIGH (ref 150–400)
RBC: 4.57 M/uL (ref 3.80–5.20)
RDW: 15.4 % — ABNORMAL HIGH (ref 11.5–14.5)
WBC: 8.4 10*3/uL (ref 3.6–11.0)

## 2021-02-24 MED ORDER — ACETAMINOPHEN 325 MG TABLET
325 mg | Freq: Four times a day (QID) | ORAL | Status: AC | PRN
Start: 2021-02-24 — End: 2021-03-09
  Administered 2021-03-01 – 2021-03-09 (×3): via ORAL

## 2021-02-24 MED ORDER — DEXTROSE 10% IN WATER (D10W) IV
10 % | INTRAVENOUS | Status: AC | PRN
Start: 2021-02-24 — End: 2021-03-09

## 2021-02-24 MED ORDER — GLUCOSE 4 GRAM CHEWABLE TAB
4 gram | ORAL | Status: AC | PRN
Start: 2021-02-24 — End: 2021-03-09

## 2021-02-24 MED ORDER — HYDROMORPHONE 1 MG/ML INJECTION SOLUTION
1 mg/mL | INTRAMUSCULAR | Status: DC | PRN
Start: 2021-02-24 — End: 2021-02-26
  Administered 2021-02-24 – 2021-02-26 (×11): via INTRAVENOUS

## 2021-02-24 MED ORDER — GLUCAGON 1 MG INJECTION
1 mg | INTRAMUSCULAR | Status: AC | PRN
Start: 2021-02-24 — End: 2021-03-09

## 2021-02-24 MED ORDER — LISINOPRIL 20 MG TAB
20 mg | Freq: Every day | ORAL | Status: AC
Start: 2021-02-24 — End: 2021-03-09
  Administered 2021-02-25 – 2021-03-09 (×11): via ORAL

## 2021-02-24 MED ORDER — INSULIN LISPRO 100 UNIT/ML INJECTION
100 unit/mL | Freq: Four times a day (QID) | SUBCUTANEOUS | Status: AC
Start: 2021-02-24 — End: 2021-03-09
  Administered 2021-02-27 – 2021-03-09 (×19): via SUBCUTANEOUS

## 2021-02-24 MED ORDER — LACTATED RINGERS IV
INTRAVENOUS | Status: AC
Start: 2021-02-24 — End: 2021-02-26
  Administered 2021-02-24 – 2021-02-25 (×2): via INTRAVENOUS

## 2021-02-24 MED ADMIN — ketorolac (TORADOL) injection 15 mg: INTRAVENOUS | @ 03:00:00 | NDC 72611072201

## 2021-02-24 MED ADMIN — ketorolac (TORADOL) injection 15 mg: INTRAVENOUS | @ 20:00:00 | NDC 72611072201

## 2021-02-24 MED ADMIN — ketorolac (TORADOL) injection 15 mg: INTRAVENOUS | @ 09:00:00 | NDC 72611072201

## 2021-02-24 MED ADMIN — ketorolac (TORADOL) injection 15 mg: INTRAVENOUS | @ 15:00:00 | NDC 72611072201

## 2021-02-24 MED FILL — HYDROMORPHONE 0.5 MG/0.5 ML SYRINGE: 0.5 mg/ mL | INTRAMUSCULAR | Qty: 0.5

## 2021-02-24 MED FILL — HYDROMORPHONE (PF) 1 MG/ML IJ SOLN: 1 mg/mL | INTRAMUSCULAR | Qty: 1

## 2021-02-24 MED FILL — ONDANSETRON (PF) 4 MG/2 ML INJECTION: 4 mg/2 mL | INTRAMUSCULAR | Qty: 2

## 2021-02-24 MED FILL — LACTATED RINGERS IV: INTRAVENOUS | Qty: 1000

## 2021-02-24 MED FILL — HEPARIN (PORCINE) 5,000 UNIT/ML IJ SOLN: 5000 unit/mL | INTRAMUSCULAR | Qty: 1

## 2021-02-24 MED FILL — KETOROLAC TROMETHAMINE 30 MG/ML INJECTION: 30 mg/mL (1 mL) | INTRAMUSCULAR | Qty: 1

## 2021-02-24 NOTE — Progress Notes (Signed)
02/24/2021  3:32 PM  Care Management Assessment      Reason for Admission: Emergency - Awaiting bowel function return.      ICD-10-CM ICD-9-CM    1. Abdominal pain, generalized  R10.84 789.07           Assessment:   [] In person with pt   [x] Via p/c with pt   [] With family member in person. Who/Relation:     [] With family member via p/c. Who/Relation:   [] Chart Review    RUR: NA - OBS  Risk Level: [x] Low [] Moderate [] High  Value-based purchasing: []  Yes [x]  No    Advance Directive: Full Code.    [x]  No AD on file.    []  AD on file.    []  Current AD not on file. Copy requested.    []  Requests AD, and referral submitted to Spiritual [facility].     Healthcare Decision Maker: Donna Bernard - Mother        Assessment:    Age: 40 y.o.    Sex: []  Female [x] Female     Residency: [x] Private residence [] Apartment [] Assisted Living [] LTC [] Other:   Exterior Steps: 2  Interior Steps: 0    Lives With: [] With spouse [] Other family members [x] Underage children [] Alone [] Care provider [] Other:    Prior functioning:  [x] Independent with ADLs and iADLS [] Dependent with ADLs and iADLs [] Partial dependence, Specify:     Cognition: [x] Intact [] Some spheres some of the time. [] Some spheres all of the time. [] All spheres all of the time.     Prior transportation method: [x] Self [] Spouse [x] Other family members [] Medicaid transport [] Other:     Prior DME required:  [x] None [] RW [] Cane [] Crutches [] Bedside commode [] CPAP [] Home O2 (Liter/Provider: ) [] Nebulizer   [] Shower Chair [] Wheelchair [] Hospital Bed [] Hoyer [] Stair lift [] Rollator [] Other:    Rehab history: [x] None [] Outpatient PT [] Home Health (Provider/Date: ) [] SNF (Provider/Date: ) [] IPR (Provider/Date: ) [] LTC (Provider/Date: ) [] Hospice (Provider/Date: )  [] Other:     Covid vaccination status:   []  Yes, Type:  []  Booster 1 []  Booster 2  []  No  [x]  N/A / Not asked    Insurer:   [employer] [facility] [employer] Phone: --    Subscriber:  Osvaldo Angst Subscriber#: [id number]    Group#: [id number] Precert#: --        MEDICAID OF [place] MEDICAID OF [place] Phone: --    SubscriberGeorgianna, Lwin Subscriber#: [id number]    Group#: -- Precert#: --            PCP: Scheryl Marten   Address: [address] [place] [address] / [place] [address]   Phone number: [phone]   Current patient: [x] Yes [] No    Approximate date of last visit: 12/16/2020   Access to virtual PCP visits: [] Yes [x] No     Financial concerns/barriers: [] Yes, explain: [x] No [] Unknown/Not discussed    [facility] [address] [facility]    DC Transport: Family     Discharge Concerns: [] Yes [x] No [] Unknown   Describe:    Comments:           Transition of care plan:    [] Unable to determine at this time. Awaiting clinical progress, and disposition recommendations.    [x]  Home with family assistance as needed, and outpatient follow-up.    []  Home with Outpatient PT and outpatient follow-up   Pt aware of OP  appt? [] Yes, Provider:   [] Not scheduled   Transport provider:     []  Home with Home Health   - Provider:     [] SNF/IPR   -[] Preferences given:   [] Listing provided and preferences requested   -Status: [] Pending [] Accepted:    -Auth required: [] Yes [] No    -Auth initiated date:   -3 midnight stay required: [] Yes [] No  Date satisfied:     []  LTC:     []  Home with Hospice   -Provider:     []  Dispatch Health information provided.     []  Other:     Sherrlyn Hock, MA    Care Management Interventions  PCP Verified by CM: Yes  Mode of Transport at Discharge: Other (see comment)  MyChart Signup: No  Discharge Durable Medical Equipment: No  Physical Therapy Consult: No  Occupational Therapy Consult: No  Speech Therapy Consult: No  Support Systems: Other Family Member(s)  Confirm Follow Up Transport: Family  Discharge Location  Patient Expects to be Discharged to:: Home with family assistance

## 2021-02-24 NOTE — Progress Notes (Signed)
 Problem: Falls - Risk of  Goal: *Absence of Falls  Description  Document Bridgette Habermann Fall Risk and appropriate interventions in the flowsheet.  Outcome: Progressing Towards Goal  Note: Fall Risk Interventions:            Medication Interventions: Patient to call before getting OOB                   Problem: Patient Education: Go to Patient Education Activity  Goal: Patient/Family Education  Outcome: Progressing Towards Goal     Problem: Pain  Goal: *Control of Pain  Outcome: Progressing Towards Goal  Goal: *PALLIATIVE CARE:  Alleviation of Pain  Outcome: Progressing Towards Goal     Problem: Patient Education: Go to Patient Education Activity  Goal: Patient/Family Education  Outcome: Progressing Towards Goal

## 2021-02-24 NOTE — Progress Notes (Signed)
Bedside and Verbal shift change report given to Caryn Bee, RN (oncoming nurse) by Theron Arista, RN (offgoing nurse). Report included the following information SBAR, Kardex, Intake/Output, MAR, and Recent Results.

## 2021-02-24 NOTE — Progress Notes (Signed)
02/24/21      State Observation Letter was verbally explained to patient and provided in writing to patient.  The patient Declined to sign document. Requested to speak to CM wich I Verbally advised CM wich NO Acknowledgement was given.

## 2021-02-25 LAB — CBC WITH AUTO DIFFERENTIAL
Basophils %: 1 % (ref 0–1)
Basophils Absolute: 0.1 10*3/uL (ref 0.0–0.1)
Eosinophils %: 1 % (ref 0–7)
Eosinophils Absolute: 0.1 10*3/uL (ref 0.0–0.4)
Granulocyte Absolute Count: 0 10*3/uL (ref 0.00–0.04)
Hematocrit: 36 % (ref 35.0–47.0)
Hemoglobin: 11.3 g/dL — ABNORMAL LOW (ref 11.5–16.0)
Immature Granulocytes: 0 % (ref 0.0–0.5)
Lymphocytes %: 32 % (ref 12–49)
Lymphocytes Absolute: 2.5 10*3/uL (ref 0.8–3.5)
MCH: 25.5 PG — ABNORMAL LOW (ref 26.0–34.0)
MCHC: 31.4 g/dL (ref 30.0–36.5)
MCV: 81.1 FL (ref 80.0–99.0)
MPV: 11.2 FL (ref 8.9–12.9)
Monocytes %: 6 % (ref 5–13)
Monocytes Absolute: 0.4 10*3/uL (ref 0.0–1.0)
NRBC Absolute: 0 10*3/uL (ref 0.00–0.01)
Neutrophils %: 60 % (ref 32–75)
Neutrophils Absolute: 4.8 10*3/uL (ref 1.8–8.0)
Nucleated RBCs: 0 PER 100 WBC
Platelets: 400 10*3/uL (ref 150–400)
RBC: 4.44 M/uL (ref 3.80–5.20)
RDW: 15.4 % — ABNORMAL HIGH (ref 11.5–14.5)
WBC: 7.9 10*3/uL (ref 3.6–11.0)

## 2021-02-25 LAB — BASIC METABOLIC PANEL
Anion Gap: 8 mmol/L (ref 5–15)
BUN: 13 MG/DL (ref 6–20)
Bun/Cre Ratio: 19 (ref 12–20)
CO2: 27 mmol/L (ref 21–32)
Calcium: 9.2 MG/DL (ref 8.5–10.1)
Chloride: 106 mmol/L (ref 97–108)
Creatinine: 0.69 MG/DL (ref 0.55–1.02)
ESTIMATED GLOMERULAR FILTRATION RATE: >60 mL/min/{1.73_m2} (ref >60)
Glucose: 116 mg/dL — ABNORMAL HIGH (ref 65–100)
Potassium: 3.6 mmol/L (ref 3.5–5.1)
Sodium: 141 mmol/L (ref 136–145)

## 2021-02-25 LAB — POCT GLUCOSE
POC Glucose: 89 mg/dL (ref 65–117)
POC Glucose: 114 mg/dL (ref 65–117)
POC Glucose: 106 mg/dL (ref 65–117)
POC Glucose: 100 mg/dL (ref 65–117)

## 2021-02-25 LAB — HCG URINE, QL. - POC
HCG, Pregnancy, Urine, POC: NEGATIVE
Pregnancy test,urine (POC): NEGATIVE

## 2021-02-25 LAB — CBC WITH AUTOMATED DIFF
ABS. BASOPHILS: 0.1 10*3/uL (ref 0.0–0.1)
ABS. EOSINOPHILS: 0.1 10*3/uL (ref 0.0–0.4)
ABS. IMM. GRANS.: 0 10*3/uL (ref 0.00–0.04)
ABS. LYMPHOCYTES: 2.5 10*3/uL (ref 0.8–3.5)
ABS. MONOCYTES: 0.4 10*3/uL (ref 0.0–1.0)
ABS. NEUTROPHILS: 4.8 10*3/uL (ref 1.8–8.0)
ABSOLUTE NRBC: 0 10*3/uL (ref 0.00–0.01)
BASOPHILS: 1 % (ref 0–1)
EOSINOPHILS: 1 % (ref 0–7)
HCT: 36 % (ref 35.0–47.0)
HGB: 11.3 g/dL — ABNORMAL LOW (ref 11.5–16.0)
IMMATURE GRANULOCYTES: 0 % (ref 0.0–0.5)
LYMPHOCYTES: 32 % (ref 12–49)
MCH: 25.5 PG — ABNORMAL LOW (ref 26.0–34.0)
MCHC: 31.4 g/dL (ref 30.0–36.5)
MCV: 81.1 FL (ref 80.0–99.0)
MONOCYTES: 6 % (ref 5–13)
MPV: 11.2 FL (ref 8.9–12.9)
NEUTROPHILS: 60 % (ref 32–75)
NRBC: 0 PER 100 WBC
PLATELET: 400 10*3/uL (ref 150–400)
RBC: 4.44 M/uL (ref 3.80–5.20)
RDW: 15.4 % — ABNORMAL HIGH (ref 11.5–14.5)
WBC: 7.9 10*3/uL (ref 3.6–11.0)

## 2021-02-25 LAB — GLUCOSE, POC
Glucose (POC): 89 mg/dL (ref 65–117)
Glucose (POC): 114 mg/dL (ref 65–117)
Glucose (POC): 106 mg/dL (ref 65–117)
Glucose (POC): 100 mg/dL (ref 65–117)

## 2021-02-25 LAB — METABOLIC PANEL, BASIC
Anion gap: 8 mmol/L (ref 5–15)
BUN/Creatinine ratio: 19 (ref 12–20)
BUN: 13 MG/DL (ref 6–20)
CO2: 27 mmol/L (ref 21–32)
Calcium: 9.2 MG/DL (ref 8.5–10.1)
Chloride: 106 mmol/L (ref 97–108)
Creatinine: 0.69 MG/DL (ref 0.55–1.02)
Glucose: 116 mg/dL — ABNORMAL HIGH (ref 65–100)
Potassium: 3.6 mmol/L (ref 3.5–5.1)
Sodium: 141 mmol/L (ref 136–145)
eGFR: >60 mL/min/{1.73_m2} (ref >60)

## 2021-02-25 MED ADMIN — ketorolac (TORADOL) injection 15 mg: INTRAVENOUS | @ 14:00:00 | NDC 72611072201

## 2021-02-25 MED ADMIN — ketorolac (TORADOL) injection 15 mg: INTRAVENOUS | @ 09:00:00 | NDC 63323016243

## 2021-02-25 MED ADMIN — ketorolac (TORADOL) injection 15 mg: INTRAVENOUS | @ 03:00:00 | NDC 72611072201

## 2021-02-25 MED ADMIN — ketorolac (TORADOL) injection 15 mg: INTRAVENOUS | @ 21:00:00 | NDC 72611072201

## 2021-02-25 MED FILL — KETOROLAC TROMETHAMINE 30 MG/ML INJECTION: 30 mg/mL (1 mL) | INTRAMUSCULAR | Qty: 1

## 2021-02-25 MED FILL — PIPERACILLIN-TAZOBACTAM 3.375 GRAM IV SOLR: 3.375 gram | INTRAVENOUS | Qty: 3.38

## 2021-02-25 MED FILL — HEPARIN (PORCINE) 5,000 UNIT/ML IJ SOLN: 5000 unit/mL | INTRAMUSCULAR | Qty: 1

## 2021-02-25 MED FILL — ONDANSETRON (PF) 4 MG/2 ML INJECTION: 4 mg/2 mL | INTRAMUSCULAR | Qty: 2

## 2021-02-25 MED FILL — HYDROMORPHONE (PF) 1 MG/ML IJ SOLN: 1 mg/mL | INTRAMUSCULAR | Qty: 1

## 2021-02-25 MED FILL — LISINOPRIL 20 MG TAB: 20 mg | ORAL | Qty: 1

## 2021-02-25 NOTE — Progress Notes (Signed)
02/25/2021  9:29 AM  Care Management Progress Note      ICD-10-CM ICD-9-CM    1. Abdominal pain, generalized  R10.84 789.07           RUR:  NA - OBS  Risk Level: [x] Low [] Moderate [] High  Value-based purchasing: [x]  Yes []  No  Bundle patient: []  Yes [x]  No   Specify:     Transition of care plan:  Awaiting medical clearance and DC order. Awaiting bowel function return. Pt ambulating in halls.   Home with family assistance as needed.   Outpatient follow-up.  Pt's family to transport.

## 2021-02-25 NOTE — Progress Notes (Signed)
General Surgery Daily Progress Note    Patient: Alyssa [name] MRN: [id number]  SSN: [id number]    Date of Birth: Jan 13, 1981  Age: 40 y.o.  Sex: female      Admit Date: 02/23/2021    POD * No surgery found *    Procedure: * No surgery found *    Subjective:     Pt states she overall feels worse  Still with nausea  No flatus or BMS  No sharp shooting pains in mid abd since overnight, but still very uncomfortable  Questioned about getting colonoscopy.      Current Facility-Administered Medications   Medication Dose Route Frequency    lactated Ringers infusion  100 mL/hr IntraVENous CONTINUOUS    lisinopriL (PRINIVIL, ZESTRIL) tablet 20 mg  20 mg Oral DAILY    insulin lispro (HUMALOG) injection   SubCUTAneous Q6H    glucose chewable tablet 16 g  4 Tablet Oral PRN    glucagon (GLUCAGEN) injection 1 mg  1 mg IntraMUSCular PRN    dextrose 10% infusion 0-250 mL  0-250 mL IntraVENous PRN    acetaminophen (TYLENOL) tablet 650 mg  650 mg Oral Q6H PRN    piperacillin-tazobactam (ZOSYN) 3.375 g in 0.9% sodium chloride (MBP/ADV) 100 mL MBP  3.375 g IntraVENous Q8H    ondansetron (ZOFRAN) injection 4 mg  4 mg IntraVENous Q4H PRN    ketorolac (TORADOL) injection 15 mg  15 mg IntraVENous Q6H    heparin (porcine) injection 5,000 Units  5,000 Units SubCUTAneous Q8H    HYDROmorphone (DILAUDID) injection 1 mg  1 mg IntraVENous Q4H PRN        Objective:   11/10 0701 - 11/10 1900  In: 1236.7 [I.V.:1236.7]  Out: -   11/08 1901 - 11/10 0700  In: 833.3 [I.V.:833.3]  Out: -   Patient Vitals for the past 8 hrs:   BP Temp Pulse Resp SpO2   02/25/21 0802 128/79 97.6 ??F (36.4 ??C) 94 16 96 %       Physical Exam:  General: Alert, cooperative, NAD  Lungs: Unlabored  Heart:  RRR  Abdomen: Soft, obese, tender mid abd from just above umbilicus to just below, mildly distended.   Extremities: Warm, moves all, no edema  Skin:  Warm and dry, no rash    Labs:   Recent Labs     02/24/21  0355   WBC 8.4   HGB 11.6   HCT 37.2   PLT 421*     Recent Labs      02/24/21  0355   NA 138   K 3.4*   CL 104   CO2 28   GLU 119*   BUN 9   CREA 0.66   CA 9.1       Assessment / Plan:     POD * No surgery found *    Procedure: * No surgery found *     Cont NPO  Cont IVF  Cont IV antibx  CBC/CMP  Ambulate frequently    Discussed with pt why colonoscopy at this junture would not be in her best interest due to inflammation and that we usually try to have pt obtain one about 6-8 weeks after episodes similar to this.  Pt tearful and confused that we were not doing anything for her pain and she was concerned she would worsen.  Explained to pt that we normally attempt to admin IV antibx and anti inflammatories to help decrease inflammation and give bowel rest for the  first few days after episodes like this and if clinically she does not improve there may be utility for surgical intervention.  Told pt I would discuss with Dr Wynetta Emery and when he assesses later, he would sit down and discuss plan moving forward.      Pt discussed with Dr Wynetta Emery.  His assessment and note to follow.  Milana Kidney, NP

## 2021-02-25 NOTE — Progress Notes (Signed)
Problem: Falls - Risk of  Goal: *Absence of Falls  Description: Document Alyssa [name] Fall Risk and appropriate interventions in the flowsheet.  Outcome: Progressing Towards Goal  Note: Fall Risk Interventions:            Medication Interventions: Bed/chair exit alarm, Teach patient to arise slowly                   Problem: Patient Education: Go to Patient Education Activity  Goal: Patient/Family Education  Outcome: Progressing Towards Goal     Problem: Pain  Goal: *Control of Pain  Outcome: Progressing Towards Goal  Goal: *PALLIATIVE CARE:  Alleviation of Pain  Outcome: Progressing Towards Goal     Problem: Patient Education: Go to Patient Education Activity  Goal: Patient/Family Education  Outcome: Progressing Towards Goal

## 2021-02-25 NOTE — Progress Notes (Signed)
General Surgery Daily Progress Note    Patient: Alyssa [name] MRN: [id number]  SSN: [id number]    Date of Birth: May 11, 1980  Age: 40 y.o.  Sex: female      Admit Date: 02/23/2021    POD * No surgery found *    Procedure: Procedure(s):  LAPAROSCOPY GENERAL DIAGNOSTIC, POSSIBLE OPEN, POSSIBLE BOWEL RESECTION    Subjective:     Pt states she overall feels worse  Still with nausea  No flatus or BMS  No sharp shooting pains in mid abd since overnight, but still very uncomfortable  Questioned about getting colonoscopy.      Current Facility-Administered Medications   Medication Dose Route Frequency    lactated Ringers infusion  100 mL/hr IntraVENous CONTINUOUS    lisinopriL (PRINIVIL, ZESTRIL) tablet 20 mg  20 mg Oral DAILY    insulin lispro (HUMALOG) injection   SubCUTAneous Q6H    glucose chewable tablet 16 g  4 Tablet Oral PRN    glucagon (GLUCAGEN) injection 1 mg  1 mg IntraMUSCular PRN    dextrose 10% infusion 0-250 mL  0-250 mL IntraVENous PRN    acetaminophen (TYLENOL) tablet 650 mg  650 mg Oral Q6H PRN    piperacillin-tazobactam (ZOSYN) 3.375 g in 0.9% sodium chloride (MBP/ADV) 100 mL MBP  3.375 g IntraVENous Q8H    ondansetron (ZOFRAN) injection 4 mg  4 mg IntraVENous Q4H PRN    ketorolac (TORADOL) injection 15 mg  15 mg IntraVENous Q6H    heparin (porcine) injection 5,000 Units  5,000 Units SubCUTAneous Q8H    HYDROmorphone (DILAUDID) injection 1 mg  1 mg IntraVENous Q4H PRN        Objective:   11/10 0701 - 11/10 1900  In: 1236.7 [I.V.:1236.7]  Out: -   11/08 1901 - 11/10 0700  In: 833.3 [I.V.:833.3]  Out: -   Patient Vitals for the past 8 hrs:   BP Temp Pulse Resp SpO2   02/25/21 1621 134/79 98.1 ??F (36.7 ??C) 93 16 97 %   02/25/21 1151 (!) 156/92 98.1 ??F (36.7 ??C) 92 16 90 %         Physical Exam:  General: Alert, cooperative, NAD  Lungs: Unlabored  Heart:  RRR  Abdomen: Soft, obese, tender mid abd from just above umbilicus to just below, mildly distended.   Extremities: Warm, moves all, no edema  Skin:  Warm  and dry, no rash    Labs:   Recent Labs     02/25/21  1202   WBC 7.9   HGB 11.3*   HCT 36.0   PLT 400       Recent Labs     02/25/21  1202   NA 141   K 3.6   CL 106   CO2 27   GLU 116*   BUN 13   CREA 0.69   CA 9.2         Assessment / Plan:     POD * No surgery found *    Procedure: Procedure(s):  LAPAROSCOPY GENERAL DIAGNOSTIC, POSSIBLE OPEN, POSSIBLE BOWEL RESECTION     Cont NPO  Cont IVF  Cont IV antibx  CBC/CMP  Ambulate frequently    Discussed with pt why colonoscopy at this junture would not be in her best interest due to inflammation and that we usually try to have pt obtain one about 6-8 weeks after episodes similar to this.  Pt tearful and confused that we were not doing anything for her pain and she  was concerned she would worsen.  Explained to pt that we normally attempt to admin IV antibx and anti inflammatories to help decrease inflammation and give bowel rest for the first few days after episodes like this and if clinically she does not improve there may be utility for surgical intervention.  Told pt I would discuss with Dr Wynetta Emery and when he assesses later, he would sit down and discuss plan moving forward.      Pt discussed with Dr Wynetta Emery.  His assessment and note to follow.  Arnetha Courser, MD    Agree.  Still has pain, controlled with IV meds.  Abdomen distended, no peritonitis.  Further imaging will be of little value.  Will proceed to OR for diagnostic laparoscopy, lysis of adhesions tomorrow.     Arnetha Courser, MD

## 2021-02-25 NOTE — Progress Notes (Signed)
Problem: Falls - Risk of  Goal: *Absence of Falls  Description: Document Alyssa [name] Fall Risk and appropriate interventions in the flowsheet.  Outcome: Progressing Towards Goal  Note: Fall Risk Interventions:            Medication Interventions: Bed/chair exit alarm                   Problem: Patient Education: Go to Patient Education Activity  Goal: Patient/Family Education  Outcome: Progressing Towards Goal     Problem: Pain  Goal: *Control of Pain  Outcome: Progressing Towards Goal  Goal: *PALLIATIVE CARE:  Alleviation of Pain  Outcome: Progressing Towards Goal     Problem: Patient Education: Go to Patient Education Activity  Goal: Patient/Family Education  Outcome: Progressing Towards Goal     Problem: Pressure Injury - Risk of  Goal: *Prevention of pressure injury  Description: Document [name] and appropriate interventions in the flowsheet.  Outcome: Progressing Towards Goal  Note: Pressure Injury Interventions:            Activity Interventions: Increase time out of bed    Mobility Interventions: HOB 30 degrees or less    Nutrition Interventions: Document food/fluid/supplement intake, Offer support with meals,snacks and hydration                     Problem: Patient Education: Go to Patient Education Activity  Goal: Patient/Family Education  Outcome: Progressing Towards Goal

## 2021-02-25 NOTE — Progress Notes (Signed)
Ultrasound IV by Tricia Morrow, RN :  Procedure Note    Patient meets criteria for US IV insertion.     Ultrasound IV education provided to patient. Opportunities for questions given.      Ultrasound used for PIV placement:  20 gauge 8 cm Arrow Endurance Extended Dwell(may stay in place for 29 days)  Left Cephalic location.  1 X Attempt(s).    Flushed with ease; vigorous blood return.     Procedure tolerated well. Primary RN aware of IV placement and added to LDA.      Tricia Morrow, RN

## 2021-02-25 NOTE — Progress Notes (Signed)
Bedside and Verbal shift change report given to Lennette Bihari, RN (oncoming nurse) by Collier Salina, RN (offgoing nurse). Report included the following information SBAR, Kardex, ED Summary, Intake/Output, MAR, and Recent Results.

## 2021-02-26 LAB — POCT GLUCOSE
POC Glucose: 112 mg/dL (ref 65–117)
POC Glucose: 113 mg/dL (ref 65–117)
POC Glucose: 112 mg/dL (ref 65–117)
POC Glucose: 105 mg/dL (ref 65–117)

## 2021-02-26 LAB — COVID-19, RAPID: SARS-CoV-2, Rapid: NOT DETECTED

## 2021-02-26 LAB — GLUCOSE, POC
Glucose (POC): 112 mg/dL (ref 65–117)
Glucose (POC): 113 mg/dL (ref 65–117)
Glucose (POC): 112 mg/dL (ref 65–117)
Glucose (POC): 105 mg/dL (ref 65–117)

## 2021-02-26 LAB — SAMPLES BEING HELD

## 2021-02-26 LAB — COVID-19 RAPID TEST: COVID-19 rapid test: NOT DETECTED

## 2021-02-26 MED ORDER — MIDAZOLAM 1 MG/ML IJ SOLN
1 mg/mL | INTRAMUSCULAR | Status: AC
Start: 2021-02-26 — End: ?

## 2021-02-26 MED ORDER — LIDOCAINE (PF) 10 MG/ML (1 %) IJ SOLN
10 mg/mL (1 %) | INTRAMUSCULAR | Status: DC | PRN
Start: 2021-02-26 — End: 2021-02-26

## 2021-02-26 MED ORDER — FENTANYL CITRATE (PF) 50 MCG/ML IJ SOLN
50 mcg/mL | INTRAMUSCULAR | Status: AC
Start: 2021-02-26 — End: ?

## 2021-02-26 MED ORDER — LACTATED RINGERS IV
INTRAVENOUS | Status: DC
Start: 2021-02-26 — End: 2021-02-26

## 2021-02-26 MED ORDER — BUPIVACAINE LIPOSOME (PF) 266 MG/20 ML (13.3 MG/ML) SUSP, INFILTRATION
1.3 % (13.3 mg/mL) | Status: AC
Start: 2021-02-26 — End: ?

## 2021-02-26 MED ORDER — ROCURONIUM 10 MG/ML IV
10 mg/mL | INTRAVENOUS | Status: AC
Start: 2021-02-26 — End: ?

## 2021-02-26 MED ORDER — PROPOFOL 10 MG/ML IV EMUL
10 mg/mL | INTRAVENOUS | Status: AC
Start: 2021-02-26 — End: ?

## 2021-02-26 MED ORDER — BUPIVACAINE (PF) 0.5 % (5 MG/ML) IJ SOLN
0.5 % (5 mg/mL) | INTRAMUSCULAR | Status: AC
Start: 2021-02-26 — End: ?

## 2021-02-26 MED ORDER — HYDROMORPHONE (PF) 1 MG/ML IJ SOLN
1 mg/mL | Freq: Once | INTRAMUSCULAR | Status: DC
Start: 2021-02-26 — End: 2021-02-26

## 2021-02-26 MED ORDER — LIDOCAINE (PF) 20 MG/ML (2 %) IJ SOLN
20 mg/mL (2 %) | INTRAMUSCULAR | Status: AC
Start: 2021-02-26 — End: ?

## 2021-02-26 MED ADMIN — ketorolac (TORADOL) injection 15 mg: INTRAVENOUS | @ 09:00:00 | NDC 72611072201

## 2021-02-26 MED ADMIN — ketorolac (TORADOL) injection 15 mg: INTRAVENOUS | @ 02:00:00 | NDC 72611072201

## 2021-02-26 MED ADMIN — ketorolac (TORADOL) injection 15 mg: INTRAVENOUS | @ 14:00:00 | NDC 72611072201

## 2021-02-26 MED FILL — HYDROMORPHONE (PF) 1 MG/ML IJ SOLN: 1 mg/mL | INTRAMUSCULAR | Qty: 1

## 2021-02-26 MED FILL — KETOROLAC TROMETHAMINE 30 MG/ML INJECTION: 30 mg/mL (1 mL) | INTRAMUSCULAR | Qty: 1

## 2021-02-26 MED FILL — LACTATED RINGERS IV: INTRAVENOUS | Qty: 1000

## 2021-02-26 MED FILL — PROPOFOL 10 MG/ML IV EMUL: 10 mg/mL | INTRAVENOUS | Qty: 20

## 2021-02-26 MED FILL — FENTANYL CITRATE (PF) 50 MCG/ML IJ SOLN: 50 mcg/mL | INTRAMUSCULAR | Qty: 5

## 2021-02-26 MED FILL — PIPERACILLIN-TAZOBACTAM 3.375 GRAM IV SOLR: 3.375 gram | INTRAVENOUS | Qty: 3.38

## 2021-02-26 MED FILL — ROCURONIUM 10 MG/ML IV: 10 mg/mL | INTRAVENOUS | Qty: 5

## 2021-02-26 MED FILL — MIDAZOLAM 1 MG/ML IJ SOLN: 1 mg/mL | INTRAMUSCULAR | Qty: 2

## 2021-02-26 MED FILL — LISINOPRIL 20 MG TAB: 20 mg | ORAL | Qty: 1

## 2021-02-26 MED FILL — ONDANSETRON (PF) 4 MG/2 ML INJECTION: 4 mg/2 mL | INTRAMUSCULAR | Qty: 2

## 2021-02-26 MED FILL — HEPARIN (PORCINE) 5,000 UNIT/ML IJ SOLN: 5000 unit/mL | INTRAMUSCULAR | Qty: 1

## 2021-02-26 MED FILL — LIDOCAINE (PF) 20 MG/ML (2 %) IJ SOLN: 20 mg/mL (2 %) | INTRAMUSCULAR | Qty: 5

## 2021-02-26 MED FILL — EXPAREL (PF) 1.3 % (13.3 MG/ML) SUSPENSION FOR LOCAL INFILTRATION: 1.3 % (13.3 mg/mL) | Qty: 20

## 2021-02-26 MED FILL — MARCAINE (PF) 0.5 % (5 MG/ML) INJECTION SOLUTION: 0.5 % (5 mg/mL) | INTRAMUSCULAR | Qty: 30

## 2021-02-26 NOTE — Anesthesia Pre-Procedure Evaluation (Signed)
Anesthetic History   No history of anesthetic complications            Review of Systems / Medical History  Patient summary reviewed, nursing notes reviewed and pertinent labs reviewed    Pulmonary            Asthma : well controlled       Neuro/Psych   Within defined limits           Cardiovascular    Hypertension: well controlled              Exercise tolerance: >4 METS     GI/Hepatic/Renal     GERD: well controlled           Endo/Other    Diabetes: poorly controlled, type 2    Morbid obesity and arthritis     Other Findings   Comments: SBO           Physical Exam    Airway  Mallampati: II    Neck ROM: normal range of motion   Mouth opening: Normal     Cardiovascular    Rhythm: regular  Rate: normal         Dental  No notable dental hx       Pulmonary  Breath sounds clear to auscultation               Abdominal         Other Findings            Anesthetic Plan    ASA: 3  Anesthesia type: general          Induction: Intravenous  Anesthetic plan and risks discussed with: Patient      Informed consent obtained.

## 2021-02-26 NOTE — Progress Notes (Signed)
Problem: Falls - Risk of  Goal: *Absence of Falls  Description: Document Bridgette Habermann Fall Risk and appropriate interventions in the flowsheet.  Outcome: Progressing Towards Goal  Note: Fall Risk Interventions:            Medication Interventions: Evaluate medications/consider consulting pharmacy, Patient to call before getting OOB, Teach patient to arise slowly                   Problem: Patient Education: Go to Patient Education Activity  Goal: Patient/Family Education  Outcome: Progressing Towards Goal     Problem: Pain  Goal: *Control of Pain  Outcome: Not Progressing Towards Goal  Goal: *PALLIATIVE CARE:  Alleviation of Pain  Outcome: Not Progressing Towards Goal     Problem: Patient Education: Go to Patient Education Activity  Goal: Patient/Family Education  Outcome: Progressing Towards Goal     Problem: Pressure Injury - Risk of  Goal: *Prevention of pressure injury  Description: Document [name] and appropriate interventions in the flowsheet.  Outcome: Progressing Towards Goal  Note: Pressure Injury Interventions:            Activity Interventions: Increase time out of bed    Mobility Interventions: Turn and reposition approx. every two hours(pillow and wedges), HOB 30 degrees or less    Nutrition Interventions: Document food/fluid/supplement intake                     Problem: Patient Education: Go to Patient Education Activity  Goal: Patient/Family Education  Outcome: Progressing Towards Goal

## 2021-02-26 NOTE — Op Note (Signed)
Brief Postoperative Note    Patient: Alyssa [name]  Date of Birth: November 18, 1980  MRN: [id number]    Date of Procedure: 02/26/2021     Pre-Op Diagnosis: SMALL BOWEL OBSTRUCTION    Post-Op Diagnosis: Same as preoperative diagnosis.      Procedure(s):  DIAGNOSTIC LAPAROSCOPY CONVERTED TO LAPAROTOMY  SMALL BOWEL RESECTION X2, EXTENSIVE LYSIS OF ADHESIONS (90 minutes)    Surgeon(s):  Arnetha Courser, MD    Surgical Assistant: Surg Asst-1: Yvonne Kendall A    Anesthesia: General     Estimated Blood Loss (mL): 315VV    Complications: None    Specimens:   ID Type Source Tests Collected by Time Destination   1 : small bowel Preservative Small Bowel  Arnetha Courser, MD 02/26/2021 2037 Pathology        Implants: * No implants in log *    Drains:   Nasogastric Tube 02/26/21 (Active)     Findings: Extensive adhesive disease.  Small bowel plastered to intraperitoneal placed mesh    Electronically Signed by Arnetha Courser, MD on 02/26/2021 at 9:46 PM

## 2021-02-26 NOTE — Anesthesia Post-Procedure Evaluation (Signed)
Procedure(s):  SMALL BOWEL RESECTION X2, LYSIS OF ADHESIONS, DIAGNOSTIC LAPAROSCOPY CONVERTED TO OPEN.    general    Anesthesia Post Evaluation      Multimodal analgesia: multimodal analgesia used between 6 hours prior to anesthesia start to PACU discharge  Patient location during evaluation: bedside  Patient participation: complete - patient participated  Level of consciousness: awake  Pain management: adequate  Airway patency: patent  Anesthetic complications: no  Cardiovascular status: acceptable  Respiratory status: acceptable  Hydration status: acceptable        INITIAL Post-op Vital signs:   Vitals Value Taken Time   BP 117/52 02/26/21 2155   Temp 36.7 ??C (98.1 ??F) 02/26/21 2152   Pulse 92 02/26/21 2157   Resp 18 02/26/21 2157   SpO2 91 % 02/26/21 2157   Vitals shown include unvalidated device data.

## 2021-02-26 NOTE — Progress Notes (Signed)
Verbal shift change report given to Anu (oncoming nurse) by Raeanne Barry (offgoing nurse). Report included the following information SBAR, Kardex, Procedure Summary, Intake/Output, MAR, and Recent Results.

## 2021-02-26 NOTE — Interval H&P Note (Signed)
 TRANSFER - OUT REPORT:    Verbal report given to Anu, RN (name) on Margerite Impastato  being transferred to 409 (unit) for routine progression of care       Report consisted of patient's Situation, Background, Assessment and   Recommendations(SBAR).     Information from the following report(s) SBAR, OR Summary, and Intake/Output was reviewed with the receiving nurse.    Lines:   Extended Dwell Peripheral IV (Active)   Criteria for Appropriate Use Limited/no vessel suitable for conventional peripheral access 02/26/21 2156   Site Assessment Clean, dry, & intact 02/26/21 2156   Phlebitis Assessment 0 02/26/21 2156   Infiltration Assessment 0 02/26/21 2156   Line Status Infusing 02/26/21 2156   Alcohol Cap Used Yes 02/26/21 2156   Dressing Type Tape;Stabilization/securement device 02/26/21 2156   Dressing Status Clean, dry, & intact 02/26/21 2156        Opportunity for questions and clarification was provided.      Patient transported with:   Registered Nurse

## 2021-02-26 NOTE — Progress Notes (Signed)
02/26/2021  9:29 AM  Care Management Progress Note      ICD-10-CM ICD-9-CM    1. Abdominal pain, generalized  R10.84 789.07           RUR:  NA - OBS  Risk Level: [x] Low [] Moderate [] High  Value-based purchasing: [x]  Yes []  No  Bundle patient: []  Yes [x]  No   Specify:     Transition of care plan:  Awaiting medical clearance and DC order. OR today for bowel resection.  Home with family assistance as needed.   Outpatient follow-up.  Pt's family to transport.

## 2021-02-26 NOTE — Interval H&P Note (Signed)
Dr Wynetta Emery into evaluate pt, consent for surgery signed by Dr Wynetta Emery

## 2021-02-26 NOTE — Progress Notes (Signed)
Problem: Falls - Risk of  Goal: *Absence of Falls  Description: Document Alyssa [name] Fall Risk and appropriate interventions in the flowsheet.  Outcome: Progressing Towards Goal  Note: Fall Risk Interventions:            Medication Interventions: Bed/chair exit alarm                   Problem: Patient Education: Go to Patient Education Activity  Goal: Patient/Family Education  Outcome: Progressing Towards Goal     Problem: Pain  Goal: *Control of Pain  Outcome: Progressing Towards Goal  Goal: *PALLIATIVE CARE:  Alleviation of Pain  Outcome: Progressing Towards Goal     Problem: Patient Education: Go to Patient Education Activity  Goal: Patient/Family Education  Outcome: Progressing Towards Goal     Problem: Pressure Injury - Risk of  Goal: *Prevention of pressure injury  Description: Document [name] and appropriate interventions in the flowsheet.  Outcome: Progressing Towards Goal  Note: Pressure Injury Interventions:            Activity Interventions: Increase time out of bed    Mobility Interventions: HOB 30 degrees or less    Nutrition Interventions: Document food/fluid/supplement intake                     Problem: Patient Education: Go to Patient Education Activity  Goal: Patient/Family Education  Outcome: Progressing Towards Goal

## 2021-02-26 NOTE — Progress Notes (Signed)
General Surgery Daily Progress Note    Patient: Alyssa [name] MRN: [id number]  SSN: [id number]    Date of Birth: 1981/03/10  Age: 40 y.o.  Sex: female      Admit Date: 02/23/2021    POD * No surgery found *    Procedure: Procedure(s):  LAPAROSCOPY GENERAL DIAGNOSTIC, POSSIBLE OPEN, POSSIBLE BOWEL RESECTION    Subjective:     No real change.  OK until pain meds wear off.     Current Facility-Administered Medications   Medication Dose Route Frequency    lactated Ringers infusion  100 mL/hr IntraVENous CONTINUOUS    lisinopriL (PRINIVIL, ZESTRIL) tablet 20 mg  20 mg Oral DAILY    insulin lispro (HUMALOG) injection   SubCUTAneous Q6H    glucose chewable tablet 16 g  4 Tablet Oral PRN    glucagon (GLUCAGEN) injection 1 mg  1 mg IntraMUSCular PRN    dextrose 10% infusion 0-250 mL  0-250 mL IntraVENous PRN    acetaminophen (TYLENOL) tablet 650 mg  650 mg Oral Q6H PRN    piperacillin-tazobactam (ZOSYN) 3.375 g in 0.9% sodium chloride (MBP/ADV) 100 mL MBP  3.375 g IntraVENous Q8H    ondansetron (ZOFRAN) injection 4 mg  4 mg IntraVENous Q4H PRN    ketorolac (TORADOL) injection 15 mg  15 mg IntraVENous Q6H    heparin (porcine) injection 5,000 Units  5,000 Units SubCUTAneous Q8H    HYDROmorphone (DILAUDID) injection 1 mg  1 mg IntraVENous Q4H PRN        Objective:   No intake/output data recorded.  11/09 1901 - 11/11 0700  In: 3451 [I.V.:3451]  Out: -   Patient Vitals for the past 8 hrs:   BP Temp Pulse Resp SpO2   02/26/21 1149 (!) 145/90 98 ??F (36.7 ??C) 93 16 90 %   02/26/21 0836 137/87 98 ??F (36.7 ??C) 90 16 95 %         Physical Exam:  General: Alert, cooperative, NAD  Lungs: Unlabored  Heart:  RRR  Abdomen: Soft, obese, tender mid abd from just above umbilicus to just below, mildly distended.   Extremities: Warm, moves all, no edema  Skin:  Warm and dry, no rash    Labs:   Recent Labs     02/25/21  1202   WBC 7.9   HGB 11.3*   HCT 36.0   PLT 400       Recent Labs     02/25/21  1202   NA 141   K 3.6   CL 106   CO2 27   GLU  116*   BUN 13   CREA 0.69   CA 9.2         Assessment / Plan:     POD * No surgery found *    Procedure: Procedure(s):  LAPAROSCOPY GENERAL DIAGNOSTIC, POSSIBLE OPEN, POSSIBLE BOWEL RESECTION     Cont NPO  Cont IVF  Cont IV antibx  CBC/CMP  Ambulate frequently    Discussed with pt why colonoscopy at this junture would not be in her best interest due to inflammation and that we usually try to have pt obtain one about 6-8 weeks after episodes similar to this.  Pt tearful and confused that we were not doing anything for her pain and she was concerned she would worsen.  Explained to pt that we normally attempt to admin IV antibx and anti inflammatories to help decrease inflammation and give bowel rest for the first few days after  episodes like this and if clinically she does not improve there may be utility for surgical intervention.  Told pt I would discuss with Dr Wynetta Emery and when he assesses later, he would sit down and discuss plan moving forward.      Pt discussed with Dr Wynetta Emery.  His assessment and note to follow.  Arnetha Courser, MD    OR for diagnostic lap today.     Arnetha Courser, MD

## 2021-02-27 LAB — CBC WITH AUTO DIFFERENTIAL
Basophils %: 0 % (ref 0–1)
Basophils Absolute: 0 10*3/uL (ref 0.0–0.1)
Eosinophils %: 0 % (ref 0–7)
Eosinophils Absolute: 0 10*3/uL (ref 0.0–0.4)
Granulocyte Absolute Count: 0 10*3/uL (ref 0.00–0.04)
Hematocrit: 38.5 % (ref 35.0–47.0)
Hemoglobin: 11.7 g/dL (ref 11.5–16.0)
Immature Granulocytes: 0 % (ref 0.0–0.5)
Lymphocytes %: 12 % (ref 12–49)
Lymphocytes Absolute: 1.1 10*3/uL (ref 0.8–3.5)
MCH: 25.2 PG — ABNORMAL LOW (ref 26.0–34.0)
MCHC: 30.4 g/dL (ref 30.0–36.5)
MCV: 83 FL (ref 80.0–99.0)
MPV: 10.8 FL (ref 8.9–12.9)
Monocytes %: 8 % (ref 5–13)
Monocytes Absolute: 0.7 10*3/uL (ref 0.0–1.0)
NRBC Absolute: 0 10*3/uL (ref 0.00–0.01)
Neutrophils %: 80 % — ABNORMAL HIGH (ref 32–75)
Neutrophils Absolute: 7.4 10*3/uL (ref 1.8–8.0)
Nucleated RBCs: 0 PER 100 WBC
Platelets: 447 10*3/uL — ABNORMAL HIGH (ref 150–400)
RBC: 4.64 M/uL (ref 3.80–5.20)
RDW: 15.5 % — ABNORMAL HIGH (ref 11.5–14.5)
WBC: 9.3 10*3/uL (ref 3.6–11.0)

## 2021-02-27 LAB — POCT GLUCOSE
POC Glucose: 166 mg/dL — ABNORMAL HIGH (ref 65–117)
POC Glucose: 191 mg/dL — ABNORMAL HIGH (ref 65–117)
POC Glucose: 153 mg/dL — ABNORMAL HIGH (ref 65–117)
POC Glucose: 164 mg/dL — ABNORMAL HIGH (ref 65–117)
POC Glucose: 140 mg/dL — ABNORMAL HIGH (ref 65–117)

## 2021-02-27 LAB — BASIC METABOLIC PANEL
Anion Gap: 9 mmol/L (ref 5–15)
BUN: 17 MG/DL (ref 6–20)
Bun/Cre Ratio: 22 — ABNORMAL HIGH (ref 12–20)
CO2: 21 mmol/L (ref 21–32)
Calcium: 8.5 MG/DL (ref 8.5–10.1)
Chloride: 108 mmol/L (ref 97–108)
Creatinine: 0.78 MG/DL (ref 0.55–1.02)
ESTIMATED GLOMERULAR FILTRATION RATE: >60 mL/min/{1.73_m2} (ref >60)
Glucose: 175 mg/dL — ABNORMAL HIGH (ref 65–100)
Potassium: 4.3 mmol/L (ref 3.5–5.1)
Sodium: 138 mmol/L (ref 136–145)

## 2021-02-27 LAB — TYPE AND SCREEN
ABO/Rh: AB POS
Antibody Screen: NEGATIVE

## 2021-02-27 LAB — CBC WITH AUTOMATED DIFF
ABS. BASOPHILS: 0 10*3/uL (ref 0.0–0.1)
ABS. EOSINOPHILS: 0 10*3/uL (ref 0.0–0.4)
ABS. IMM. GRANS.: 0 10*3/uL (ref 0.00–0.04)
ABS. LYMPHOCYTES: 1.1 10*3/uL (ref 0.8–3.5)
ABS. MONOCYTES: 0.7 10*3/uL (ref 0.0–1.0)
ABS. NEUTROPHILS: 7.4 10*3/uL (ref 1.8–8.0)
ABSOLUTE NRBC: 0 10*3/uL (ref 0.00–0.01)
BASOPHILS: 0 % (ref 0–1)
EOSINOPHILS: 0 % (ref 0–7)
HCT: 38.5 % (ref 35.0–47.0)
HGB: 11.7 g/dL (ref 11.5–16.0)
IMMATURE GRANULOCYTES: 0 % (ref 0.0–0.5)
LYMPHOCYTES: 12 % (ref 12–49)
MCH: 25.2 PG — ABNORMAL LOW (ref 26.0–34.0)
MCHC: 30.4 g/dL (ref 30.0–36.5)
MCV: 83 FL (ref 80.0–99.0)
MONOCYTES: 8 % (ref 5–13)
MPV: 10.8 FL (ref 8.9–12.9)
NEUTROPHILS: 80 % — ABNORMAL HIGH (ref 32–75)
NRBC: 0 PER 100 WBC
PLATELET: 447 10*3/uL — ABNORMAL HIGH (ref 150–400)
RBC: 4.64 M/uL (ref 3.80–5.20)
RDW: 15.5 % — ABNORMAL HIGH (ref 11.5–14.5)
WBC: 9.3 10*3/uL (ref 3.6–11.0)

## 2021-02-27 LAB — GLUCOSE, POC
Glucose (POC): 166 mg/dL — ABNORMAL HIGH (ref 65–117)
Glucose (POC): 191 mg/dL — ABNORMAL HIGH (ref 65–117)
Glucose (POC): 153 mg/dL — ABNORMAL HIGH (ref 65–117)
Glucose (POC): 164 mg/dL — ABNORMAL HIGH (ref 65–117)
Glucose (POC): 140 mg/dL — ABNORMAL HIGH (ref 65–117)

## 2021-02-27 LAB — METABOLIC PANEL, BASIC
Anion gap: 9 mmol/L (ref 5–15)
BUN/Creatinine ratio: 22 — ABNORMAL HIGH (ref 12–20)
BUN: 17 MG/DL (ref 6–20)
CO2: 21 mmol/L (ref 21–32)
Calcium: 8.5 MG/DL (ref 8.5–10.1)
Chloride: 108 mmol/L (ref 97–108)
Creatinine: 0.78 MG/DL (ref 0.55–1.02)
Glucose: 175 mg/dL — ABNORMAL HIGH (ref 65–100)
Potassium: 4.3 mmol/L (ref 3.5–5.1)
Sodium: 138 mmol/L (ref 136–145)
eGFR: >60 mL/min/{1.73_m2} (ref >60)

## 2021-02-27 LAB — TYPE & SCREEN
ABO/Rh(D): AB POS
Antibody screen: NEGATIVE

## 2021-02-27 MED ORDER — PHENOL 1.4 % MUCOSAL AEROSOL SPRAY
1.4 % | Status: AC | PRN
Start: 2021-02-27 — End: 2021-03-09
  Administered 2021-02-27: via ORAL

## 2021-02-27 MED ORDER — HYDROMORPHONE (PF) 2 MG/ML IJ SOLN
2 mg/mL | INTRAMUSCULAR | Status: DC | PRN
Start: 2021-02-27 — End: 2021-02-26
  Administered 2021-02-27 (×2): via INTRAVENOUS

## 2021-02-27 MED ORDER — DIPHENHYDRAMINE HCL 50 MG/ML IJ SOLN
50 mg/mL | INTRAMUSCULAR | Status: DC | PRN
Start: 2021-02-27 — End: 2021-02-26

## 2021-02-27 MED ORDER — SUGAMMADEX 100 MG/ML INTRAVENOUS SOLUTION
100 mg/mL | INTRAVENOUS | Status: AC
Start: 2021-02-27 — End: ?

## 2021-02-27 MED ORDER — HYDROMORPHONE (PF) 1 MG/ML IJ SOLN
1 mg/mL | INTRAMUSCULAR | Status: AC
Start: 2021-02-27 — End: 2021-02-27
  Administered 2021-02-27: 07:00:00 via INTRAVENOUS

## 2021-02-27 MED ORDER — SODIUM CHLORIDE 0.9 % INJECTION
40 mg | Freq: Two times a day (BID) | INTRAMUSCULAR | Status: AC
Start: 2021-02-27 — End: 2021-03-04
  Administered 2021-02-27 – 2021-03-04 (×12): via INTRAVENOUS

## 2021-02-27 MED ORDER — MIDAZOLAM 1 MG/ML IJ SOLN
1 mg/mL | INTRAMUSCULAR | Status: DC | PRN
Start: 2021-02-27 — End: 2021-02-26
  Administered 2021-02-26: via INTRAVENOUS

## 2021-02-27 MED ORDER — PROPOFOL 10 MG/ML IV EMUL
10 mg/mL | INTRAVENOUS | Status: DC | PRN
Start: 2021-02-27 — End: 2021-02-26
  Administered 2021-02-27: via INTRAVENOUS

## 2021-02-27 MED ORDER — FENTANYL CITRATE (PF) 50 MCG/ML IJ SOLN
50 mcg/mL | INTRAMUSCULAR | Status: DC | PRN
Start: 2021-02-27 — End: 2021-02-26
  Administered 2021-02-27 (×4): via INTRAVENOUS

## 2021-02-27 MED ORDER — ONDANSETRON (PF) 4 MG/2 ML INJECTION
4 mg/2 mL | INTRAMUSCULAR | Status: DC | PRN
Start: 2021-02-27 — End: 2021-02-26
  Administered 2021-02-27: via INTRAVENOUS

## 2021-02-27 MED ORDER — ROCURONIUM 10 MG/ML IV
10 mg/mL | INTRAVENOUS | Status: DC | PRN
Start: 2021-02-27 — End: 2021-02-26
  Administered 2021-02-27 (×2): via INTRAVENOUS

## 2021-02-27 MED ORDER — BUPIVACAINE LIPOSOME (PF) 266 MG/20 ML (13.3 MG/ML) SUSP, INFILTRATION
1.3 % (13.3 mg/mL) | Status: DC | PRN
Start: 2021-02-27 — End: 2021-02-26
  Administered 2021-02-27: 02:00:00 via SUBCUTANEOUS

## 2021-02-27 MED ORDER — HYDROMORPHONE 0.5 MG/0.5 ML SYRINGE
0.5 mg/ mL | INTRAMUSCULAR | Status: DC | PRN
Start: 2021-02-27 — End: 2021-02-26
  Administered 2021-02-27: 04:00:00 via INTRAVENOUS

## 2021-02-27 MED ORDER — LIDOCAINE (PF) 20 MG/ML (2 %) IJ SOLN
20 mg/mL (2 %) | INTRAMUSCULAR | Status: DC | PRN
Start: 2021-02-27 — End: 2021-02-26
  Administered 2021-02-27: via INTRAVENOUS

## 2021-02-27 MED ORDER — HYDROMORPHONE 0.5 MG/0.5 ML SYRINGE
0.5 mg/ mL | INTRAMUSCULAR | Status: AC
Start: 2021-02-27 — End: 2021-02-27
  Administered 2021-02-27: 07:00:00

## 2021-02-27 MED ORDER — LACTATED RINGERS IV
INTRAVENOUS | Status: DC
Start: 2021-02-27 — End: 2021-02-26

## 2021-02-27 MED ORDER — SUGAMMADEX 100 MG/ML INTRAVENOUS SOLUTION
100 mg/mL | INTRAVENOUS | Status: DC | PRN
Start: 2021-02-27 — End: 2021-02-26
  Administered 2021-02-27: 03:00:00 via INTRAVENOUS

## 2021-02-27 MED ORDER — HYDROMORPHONE (PF) 1 MG/ML IJ SOLN
1 mg/mL | INTRAMUSCULAR | Status: AC | PRN
Start: 2021-02-27 — End: 2021-03-09
  Administered 2021-02-27 – 2021-03-08 (×47): via INTRAVENOUS

## 2021-02-27 MED ORDER — SUCCINYLCHOLINE CHLORIDE 20 MG/ML INJECTION
20 mg/mL | INTRAMUSCULAR | Status: DC | PRN
Start: 2021-02-27 — End: 2021-02-26
  Administered 2021-02-27: via INTRAVENOUS

## 2021-02-27 MED ORDER — DEXAMETHASONE SODIUM PHOSPHATE 4 MG/ML IJ SOLN
4 mg/mL | INTRAMUSCULAR | Status: DC | PRN
Start: 2021-02-27 — End: 2021-02-26
  Administered 2021-02-27: via INTRAVENOUS

## 2021-02-27 MED ORDER — GLYCOPYRROLATE 0.2 MG/ML IJ SOLN
0.2 mg/mL | INTRAMUSCULAR | Status: DC | PRN
Start: 2021-02-27 — End: 2021-02-26
  Administered 2021-02-27: via INTRAVENOUS

## 2021-02-27 MED ORDER — ONDANSETRON (PF) 4 MG/2 ML INJECTION
4 mg/2 mL | INTRAMUSCULAR | Status: DC | PRN
Start: 2021-02-27 — End: 2021-02-26

## 2021-02-27 MED ORDER — MEPERIDINE (PF) 25 MG/ML INJ SOLUTION
25 mg/ml | INTRAMUSCULAR | Status: DC | PRN
Start: 2021-02-27 — End: 2021-02-26

## 2021-02-27 MED ORDER — LACTATED RINGERS IV
INTRAVENOUS | Status: AC
Start: 2021-02-27 — End: 2021-03-07
  Administered 2021-02-27 – 2021-03-07 (×17): via INTRAVENOUS

## 2021-02-27 MED ORDER — ALBUTEROL SULFATE 0.083 % (0.83 MG/ML) SOLN FOR INHALATION
2.5 mg /3 mL (0.083 %) | RESPIRATORY_TRACT | Status: DC | PRN
Start: 2021-02-27 — End: 2021-02-26

## 2021-02-27 MED ORDER — PANTOPRAZOLE 40 MG IV SOLR
40 mg | INTRAVENOUS | Status: AC
Start: 2021-02-27 — End: 2021-02-27
  Administered 2021-02-27: 05:00:00

## 2021-02-27 MED ORDER — HYDRALAZINE 20 MG/ML IJ SOLN
20 mg/mL | Freq: Four times a day (QID) | INTRAMUSCULAR | Status: AC | PRN
Start: 2021-02-27 — End: 2021-03-09
  Administered 2021-03-03 – 2021-03-08 (×3): via INTRAVENOUS

## 2021-02-27 MED ORDER — HYDROMORPHONE 2 MG/ML INJECTION SOLUTION
2 mg/mL | INTRAMUSCULAR | Status: AC
Start: 2021-02-27 — End: ?

## 2021-02-27 MED ADMIN — ketorolac (TORADOL) injection 15 mg: INTRAVENOUS | @ 14:00:00 | NDC 72611072201

## 2021-02-27 MED ADMIN — ketorolac (TORADOL) injection 15 mg: INTRAVENOUS | @ 08:00:00 | NDC 72611072201

## 2021-02-27 MED ADMIN — ketorolac (TORADOL) injection 15 mg: INTRAVENOUS | @ 04:00:00 | NDC 63323016243

## 2021-02-27 MED ADMIN — ketorolac (TORADOL) injection 15 mg: INTRAVENOUS | @ 15:00:00 | NDC 54868384300

## 2021-02-27 MED ADMIN — ketorolac (TORADOL) injection 15 mg: INTRAVENOUS | @ 22:00:00 | NDC 72611072201

## 2021-02-27 MED FILL — HEPARIN (PORCINE) 5,000 UNIT/ML IJ SOLN: 5000 unit/mL | INTRAMUSCULAR | Qty: 1

## 2021-02-27 MED FILL — PIPERACILLIN-TAZOBACTAM 3.375 GRAM IV SOLR: 3.375 gram | INTRAVENOUS | Qty: 3.38

## 2021-02-27 MED FILL — SORE THROAT (PHENOL) 1.4 % AEROSOL SPRAY: 1.4 % | Qty: 177

## 2021-02-27 MED FILL — BRIDION 100 MG/ML INTRAVENOUS SOLUTION: 100 mg/mL | INTRAVENOUS | Qty: 2

## 2021-02-27 MED FILL — INSULIN LISPRO 100 UNIT/ML INJECTION: 100 unit/mL | SUBCUTANEOUS | Qty: 1

## 2021-02-27 MED FILL — HYDROMORPHONE 0.5 MG/0.5 ML SYRINGE: 0.5 mg/ mL | INTRAMUSCULAR | Qty: 0.5

## 2021-02-27 MED FILL — KETOROLAC TROMETHAMINE 30 MG/ML INJECTION: 30 mg/mL (1 mL) | INTRAMUSCULAR | Qty: 1

## 2021-02-27 MED FILL — LISINOPRIL 20 MG TAB: 20 mg | ORAL | Qty: 1

## 2021-02-27 MED FILL — HYDROMORPHONE (PF) 1 MG/ML IJ SOLN: 1 mg/mL | INTRAMUSCULAR | Qty: 1

## 2021-02-27 MED FILL — LACTATED RINGERS IV: INTRAVENOUS | Qty: 1000

## 2021-02-27 MED FILL — PROTONIX 40 MG INTRAVENOUS SOLUTION: 40 mg | INTRAVENOUS | Qty: 40

## 2021-02-27 MED FILL — HYDROMORPHONE 2 MG/ML INJECTION SOLUTION: 2 mg/mL | INTRAMUSCULAR | Qty: 1

## 2021-02-27 NOTE — Progress Notes (Signed)
Bedside shift change report given to Lupita Leash (Cabin crew) by Micah Flesher (offgoing nurse). Report included the following information SBAR, Kardex, Procedure Summary, Intake/Output, MAR, and Recent Results.

## 2021-02-27 NOTE — Progress Notes (Signed)
Problem: Falls - Risk of  Goal: *Absence of Falls  Description: Document Bridgette Habermann Fall Risk and appropriate interventions in the flowsheet.  Outcome: Progressing Towards Goal  Note: Fall Risk Interventions:            Medication Interventions: Bed/chair exit alarm, Evaluate medications/consider consulting pharmacy, Patient to call before getting OOB, Teach patient to arise slowly, Utilize gait belt for transfers/ambulation                   Problem: Patient Education: Go to Patient Education Activity  Goal: Patient/Family Education  Outcome: Progressing Towards Goal     Problem: Pain  Goal: *Control of Pain  Outcome: Progressing Towards Goal  Goal: *PALLIATIVE CARE:  Alleviation of Pain  Outcome: Progressing Towards Goal     Problem: Patient Education: Go to Patient Education Activity  Goal: Patient/Family Education  Outcome: Progressing Towards Goal

## 2021-02-27 NOTE — Progress Notes (Signed)
Problem: Falls - Risk of  Goal: *Absence of Falls  Description: Document Patrcia Dolly Fall Risk and appropriate interventions in the flowsheet.  Outcome: Progressing Towards Goal  Note: Fall Risk Interventions:  Mobility Interventions: Patient to call before getting OOB, Utilize gait belt for transfers/ambulation, Communicate number of staff needed for ambulation/transfer         Medication Interventions: Evaluate medications/consider consulting pharmacy, Teach patient to arise slowly, Patient to call before getting OOB, Utilize gait belt for transfers/ambulation    Elimination Interventions: Bed/chair exit alarm, Call light in reach, Patient to call for help with toileting needs, Toileting schedule/hourly rounds              Problem: Patient Education: Go to Patient Education Activity  Goal: Patient/Family Education  Outcome: Progressing Towards Goal     Problem: Pain  Goal: *Control of Pain  Outcome: Progressing Towards Goal  Goal: *PALLIATIVE CARE:  Alleviation of Pain  Outcome: Progressing Towards Goal     Problem: Pressure Injury - Risk of  Goal: *Prevention of pressure injury  Description: Document [name] and appropriate interventions in the flowsheet.  Outcome: Progressing Towards Goal  Note: Pressure Injury Interventions:            Activity Interventions: Increase time out of bed    Mobility Interventions: Float heels, Turn and reposition approx. every two hours(pillow and wedges), Assess need for specialty bed    Nutrition Interventions: Document food/fluid/supplement intake                     Problem: Patient Education: Go to Patient Education Activity  Goal: Patient/Family Education  Outcome: Progressing Towards Goal

## 2021-02-27 NOTE — Progress Notes (Signed)
General Surgery Daily Progress Note    Patient: Alyssa [name] MRN: [id number]  SSN: [id number]    Date of Birth: [birth year]  Age: 40 y.o.  Sex: female      Admit Date: 02/23/2021    POD 1 Day Post-Op    Procedure: Procedure(s):  SMALL BOWEL RESECTION X2, LYSIS OF ADHESIONS, DIAGNOSTIC LAPAROSCOPY CONVERTED TO OPEN    Subjective:   Wants NGT out  Denies N/V  No flatus or BM      Current Facility-Administered Medications   Medication Dose Route Frequency    hydrALAZINE (APRESOLINE) 20 mg/mL injection 20 mg  20 mg IntraVENous Q6H PRN    lactated Ringers infusion  125 mL/hr IntraVENous CONTINUOUS    HYDROmorphone (PF) (DILAUDID) injection 1 mg  1 mg IntraVENous Q3H PRN    pantoprazole (PROTONIX) 40 mg in 0.9% sodium chloride 10 mL injection  40 mg IntraVENous Q12H    phenol throat spray (CHLORASEPTIC) 1 Spray  1 Spray Oral PRN    lisinopriL (PRINIVIL, ZESTRIL) tablet 20 mg  20 mg Oral DAILY    insulin lispro (HUMALOG) injection   SubCUTAneous Q6H    glucose chewable tablet 16 g  4 Tablet Oral PRN    glucagon (GLUCAGEN) injection 1 mg  1 mg IntraMUSCular PRN    dextrose 10% infusion 0-250 mL  0-250 mL IntraVENous PRN    acetaminophen (TYLENOL) tablet 650 mg  650 mg Oral Q6H PRN    piperacillin-tazobactam (ZOSYN) 3.375 g in 0.9% sodium chloride (MBP/ADV) 100 mL MBP  3.375 g IntraVENous Q8H    ondansetron (ZOFRAN) injection 4 mg  4 mg IntraVENous Q4H PRN    ketorolac (TORADOL) injection 15 mg  15 mg IntraVENous Q6H    heparin (porcine) injection 5,000 Units  5,000 Units SubCUTAneous Q8H        Objective:   11/12 0701 - 11/12 1900  In: 0   Out: 875 [Urine:625]  11/10 1901 - 11/12 0700  In: 3274.8 [I.V.:3274.8]  Out: 400   Patient Vitals for the past 8 hrs:   BP Temp Pulse Resp SpO2   02/27/21 1140 124/78 98.4 ??F (36.9 ??C) 96 16 96 %       Physical Exam:  General: Alert, cooperative, NAD  Lungs: Unlabored  Abdomen: Soft, appropriately tender, some distention. Incisions C/D/I  Extremities: Warm, moves all, no  edema  Skin:  Warm and dry, no rash    Labs:   Recent Labs     02/27/21  0351   WBC 9.3   HGB 11.7   HCT 38.5   PLT 447*     Recent Labs     02/27/21  0351   NA 138   K 4.3   CL 108   CO2 21   GLU 175*   BUN 17   CREA 0.78   CA 8.5       Assessment / Plan:     POD 1 Day Post-Op    Procedure: Procedure(s):  SMALL BOWEL RESECTION X2, LYSIS OF ADHESIONS, DIAGNOSTIC LAPAROSCOPY CONVERTED TO OPEN    Continue NGT today  Continue IVF  Continue abx      Edwena Bunde, MD

## 2021-02-27 NOTE — Progress Notes (Signed)
Received a request from Patient Advocate, Ivin Booty, to see the pt on Post Surg.. Reviewed chart and spoke with pt's nurse. Introduced Insurance underwriter. Pt copes through support of family and personal resilience. Pt shared she felt unheard when she was experiencing pain. Pt relieved that she feels better now that she has had surgery. Pt thankful for the support of her coworkers at [employer]. Pt thankful for the support of the chaplains. Please contact Spiritual Care for further referrals.  [place], [place], [place], [facility]  Staff Chaplain  Paging service: [phone] (PRAY)

## 2021-02-28 LAB — CBC WITH AUTO DIFFERENTIAL
Basophils %: 0 % (ref 0–1)
Basophils Absolute: 0 10*3/uL (ref 0.0–0.1)
Eosinophils %: 0 % (ref 0–7)
Eosinophils Absolute: 0 10*3/uL (ref 0.0–0.4)
Granulocyte Absolute Count: 0 10*3/uL (ref 0.00–0.04)
Hematocrit: 33.1 % — ABNORMAL LOW (ref 35.0–47.0)
Hemoglobin: 10.1 g/dL — ABNORMAL LOW (ref 11.5–16.0)
Immature Granulocytes: 0 % (ref 0.0–0.5)
Lymphocytes %: 24 % (ref 12–49)
Lymphocytes Absolute: 2.1 10*3/uL (ref 0.8–3.5)
MCH: 25.6 PG — ABNORMAL LOW (ref 26.0–34.0)
MCHC: 30.5 g/dL (ref 30.0–36.5)
MCV: 84 FL (ref 80.0–99.0)
MPV: 11.5 FL (ref 8.9–12.9)
Monocytes %: 10 % (ref 5–13)
Monocytes Absolute: 0.9 10*3/uL (ref 0.0–1.0)
NRBC Absolute: 0 10*3/uL (ref 0.00–0.01)
Neutrophils %: 66 % (ref 32–75)
Neutrophils Absolute: 5.9 10*3/uL (ref 1.8–8.0)
Nucleated RBCs: 0 PER 100 WBC
Platelets: 398 10*3/uL (ref 150–400)
RBC: 3.94 M/uL (ref 3.80–5.20)
RDW: 15.9 % — ABNORMAL HIGH (ref 11.5–14.5)
WBC: 9 10*3/uL (ref 3.6–11.0)

## 2021-02-28 LAB — BASIC METABOLIC PANEL
Anion Gap: 5 mmol/L (ref 5–15)
BUN: 22 MG/DL — ABNORMAL HIGH (ref 6–20)
Bun/Cre Ratio: 24 — ABNORMAL HIGH (ref 12–20)
CO2: 28 mmol/L (ref 21–32)
Calcium: 8.6 MG/DL (ref 8.5–10.1)
Chloride: 106 mmol/L (ref 97–108)
Creatinine: 0.91 MG/DL (ref 0.55–1.02)
ESTIMATED GLOMERULAR FILTRATION RATE: >60 mL/min/{1.73_m2} (ref >60)
Glucose: 161 mg/dL — ABNORMAL HIGH (ref 65–100)
Potassium: 3.5 mmol/L (ref 3.5–5.1)
Sodium: 139 mmol/L (ref 136–145)

## 2021-02-28 LAB — POCT GLUCOSE
POC Glucose: 166 mg/dL — ABNORMAL HIGH (ref 65–117)
POC Glucose: 132 mg/dL — ABNORMAL HIGH (ref 65–117)
POC Glucose: 143 mg/dL — ABNORMAL HIGH (ref 65–117)
POC Glucose: 132 mg/dL — ABNORMAL HIGH (ref 65–117)
POC Glucose: 142 mg/dL — ABNORMAL HIGH (ref 65–117)

## 2021-02-28 LAB — GLUCOSE, POC
Glucose (POC): 166 mg/dL — ABNORMAL HIGH (ref 65–117)
Glucose (POC): 132 mg/dL — ABNORMAL HIGH (ref 65–117)
Glucose (POC): 143 mg/dL — ABNORMAL HIGH (ref 65–117)
Glucose (POC): 132 mg/dL — ABNORMAL HIGH (ref 65–117)
Glucose (POC): 142 mg/dL — ABNORMAL HIGH (ref 65–117)

## 2021-02-28 LAB — CBC WITH AUTOMATED DIFF
ABS. BASOPHILS: 0 10*3/uL (ref 0.0–0.1)
ABS. EOSINOPHILS: 0 10*3/uL (ref 0.0–0.4)
ABS. IMM. GRANS.: 0 10*3/uL (ref 0.00–0.04)
ABS. LYMPHOCYTES: 2.1 10*3/uL (ref 0.8–3.5)
ABS. MONOCYTES: 0.9 10*3/uL (ref 0.0–1.0)
ABS. NEUTROPHILS: 5.9 10*3/uL (ref 1.8–8.0)
ABSOLUTE NRBC: 0 10*3/uL (ref 0.00–0.01)
BASOPHILS: 0 % (ref 0–1)
EOSINOPHILS: 0 % (ref 0–7)
HCT: 33.1 % — ABNORMAL LOW (ref 35.0–47.0)
HGB: 10.1 g/dL — ABNORMAL LOW (ref 11.5–16.0)
IMMATURE GRANULOCYTES: 0 % (ref 0.0–0.5)
LYMPHOCYTES: 24 % (ref 12–49)
MCH: 25.6 PG — ABNORMAL LOW (ref 26.0–34.0)
MCHC: 30.5 g/dL (ref 30.0–36.5)
MCV: 84 FL (ref 80.0–99.0)
MONOCYTES: 10 % (ref 5–13)
MPV: 11.5 FL (ref 8.9–12.9)
NEUTROPHILS: 66 % (ref 32–75)
NRBC: 0 PER 100 WBC
PLATELET: 398 10*3/uL (ref 150–400)
RBC: 3.94 M/uL (ref 3.80–5.20)
RDW: 15.9 % — ABNORMAL HIGH (ref 11.5–14.5)
WBC: 9 10*3/uL (ref 3.6–11.0)

## 2021-02-28 LAB — METABOLIC PANEL, BASIC
Anion gap: 5 mmol/L (ref 5–15)
BUN/Creatinine ratio: 24 — ABNORMAL HIGH (ref 12–20)
BUN: 22 MG/DL — ABNORMAL HIGH (ref 6–20)
CO2: 28 mmol/L (ref 21–32)
Calcium: 8.6 MG/DL (ref 8.5–10.1)
Chloride: 106 mmol/L (ref 97–108)
Creatinine: 0.91 MG/DL (ref 0.55–1.02)
Glucose: 161 mg/dL — ABNORMAL HIGH (ref 65–100)
Potassium: 3.5 mmol/L (ref 3.5–5.1)
Sodium: 139 mmol/L (ref 136–145)
eGFR: >60 mL/min/{1.73_m2} (ref >60)

## 2021-02-28 MED ADMIN — ketorolac (TORADOL) injection 15 mg: INTRAVENOUS | @ 08:00:00 | NDC 72611072201

## 2021-02-28 MED ADMIN — ketorolac (TORADOL) injection 15 mg: INTRAVENOUS | @ 14:00:00 | NDC 72611072201

## 2021-02-28 MED ADMIN — ketorolac (TORADOL) injection 15 mg: INTRAVENOUS | @ 03:00:00 | NDC 72611072201

## 2021-02-28 MED FILL — PROTONIX 40 MG INTRAVENOUS SOLUTION: 40 mg | INTRAVENOUS | Qty: 40

## 2021-02-28 MED FILL — KETOROLAC TROMETHAMINE 30 MG/ML INJECTION: 30 mg/mL (1 mL) | INTRAMUSCULAR | Qty: 1

## 2021-02-28 MED FILL — INSULIN LISPRO 100 UNIT/ML INJECTION: 100 unit/mL | SUBCUTANEOUS | Qty: 1

## 2021-02-28 MED FILL — HYDROMORPHONE (PF) 1 MG/ML IJ SOLN: 1 mg/mL | INTRAMUSCULAR | Qty: 1

## 2021-02-28 MED FILL — PIPERACILLIN-TAZOBACTAM 3.375 GRAM IV SOLR: 3.375 gram | INTRAVENOUS | Qty: 3.38

## 2021-02-28 MED FILL — HEPARIN (PORCINE) 5,000 UNIT/ML IJ SOLN: 5000 unit/mL | INTRAMUSCULAR | Qty: 1

## 2021-02-28 NOTE — Progress Notes (Signed)
Bedside and Verbal shift change report given to [facility] (Soil scientist) by Butch Penny (offgoing nurse). Report included the following information SBAR, Kardex, and Recent Results.

## 2021-02-28 NOTE — Progress Notes (Signed)
0938: Notified by Dr. Chesley Mires to remove foley catheter and that patient is starting clamp trial to assess if NGT can be removed.    1530: For clamp trial, patient connected back to suction. Put out 164mL. Per MD notes and order, removed NGT.    Bedside shift change report given to Malachy Mood (Soil scientist) by Raeanne Barry (offgoing nurse). Report included the following information SBAR, Kardex, Procedure Summary, Intake/Output, MAR, and Recent Results.

## 2021-02-28 NOTE — Progress Notes (Signed)
Problem: Falls - Risk of  Goal: *Absence of Falls  Description: Document Alyssa [name] Fall Risk and appropriate interventions in the flowsheet.  Outcome: Progressing Towards Goal  Note: Fall Risk Interventions:  Mobility Interventions: Bed/chair exit alarm, OT consult for ADLs, Patient to call before getting OOB, PT Consult for mobility concerns, PT Consult for assist device competence         Medication Interventions: Bed/chair exit alarm, Patient to call before getting OOB, Teach patient to arise slowly    Elimination Interventions: Bed/chair exit alarm, Call light in reach, Patient to call for help with toileting needs, Toileting schedule/hourly rounds              Problem: Patient Education: Go to Patient Education Activity  Goal: Patient/Family Education  Outcome: Progressing Towards Goal     Problem: Pain  Goal: *Control of Pain  Outcome: Progressing Towards Goal  Goal: *PALLIATIVE CARE:  Alleviation of Pain  Outcome: Progressing Towards Goal     Problem: Patient Education: Go to Patient Education Activity  Goal: Patient/Family Education  Outcome: Progressing Towards Goal     Problem: Pressure Injury - Risk of  Goal: *Prevention of pressure injury  Description: Document [name] and appropriate interventions in the flowsheet.  Outcome: Progressing Towards Goal  Note: Pressure Injury Interventions:            Activity Interventions: Increase time out of bed    Mobility Interventions: HOB 30 degrees or less, Turn and reposition approx. every two hours(pillow and wedges)    Nutrition Interventions: Document food/fluid/supplement intake                     Problem: Patient Education: Go to Patient Education Activity  Goal: Patient/Family Education  Outcome: Progressing Towards Goal

## 2021-02-28 NOTE — Progress Notes (Signed)
General Surgery Daily Progress Note    Patient: Alyssa [name] MRN: [id number]  SSN: [id number]    Date of Birth: [birth year]  Age: 40 y.o.  Sex: female      Admit Date: 02/23/2021    POD 1 Day Post-Op    Procedure: Procedure(s):  SMALL BOWEL RESECTION X2, LYSIS OF ADHESIONS, DIAGNOSTIC LAPAROSCOPY CONVERTED TO OPEN    Subjective:   Wants NGT out.  NGT 700cc recorded asoutput  Denies N/V  No flatus or BM      Current Facility-Administered Medications   Medication Dose Route Frequency    hydrALAZINE (APRESOLINE) 20 mg/mL injection 20 mg  20 mg IntraVENous Q6H PRN    lactated Ringers infusion  125 mL/hr IntraVENous CONTINUOUS    HYDROmorphone (PF) (DILAUDID) injection 1 mg  1 mg IntraVENous Q3H PRN    pantoprazole (PROTONIX) 40 mg in 0.9% sodium chloride 10 mL injection  40 mg IntraVENous Q12H    phenol throat spray (CHLORASEPTIC) 1 Spray  1 Spray Oral PRN    lisinopriL (PRINIVIL, ZESTRIL) tablet 20 mg  20 mg Oral DAILY    insulin lispro (HUMALOG) injection   SubCUTAneous Q6H    glucose chewable tablet 16 g  4 Tablet Oral PRN    glucagon (GLUCAGEN) injection 1 mg  1 mg IntraMUSCular PRN    dextrose 10% infusion 0-250 mL  0-250 mL IntraVENous PRN    acetaminophen (TYLENOL) tablet 650 mg  650 mg Oral Q6H PRN    piperacillin-tazobactam (ZOSYN) 3.375 g in 0.9% sodium chloride (MBP/ADV) 100 mL MBP  3.375 g IntraVENous Q8H    ondansetron (ZOFRAN) injection 4 mg  4 mg IntraVENous Q4H PRN    ketorolac (TORADOL) injection 15 mg  15 mg IntraVENous Q6H    heparin (porcine) injection 5,000 Units  5,000 Units SubCUTAneous Q8H        Objective:   No intake/output data recorded.  11/11 1901 - 11/13 0700  In: 1776.8 [I.V.:1776.8]  Out: 1925 [Urine:825]  Patient Vitals for the past 8 hrs:   BP Temp Pulse Resp SpO2   02/28/21 0819 122/80 98.8 ??F (37.1 ??C) 98 16 (!) 89 %   02/28/21 0304 101/66 98.1 ??F (36.7 ??C) 86 16 90 %         Physical Exam:  General: Alert, cooperative, NAD  Lungs: Unlabored  Abdomen: Soft, appropriately tender, some  distention. Incisions C/D/I  Extremities: Warm, moves all, no edema  Skin:  Warm and dry, no rash    Labs:   Recent Labs     02/28/21  0213   WBC 9.0   HGB 10.1*   HCT 33.1*   PLT 398       Recent Labs     02/28/21  0213   NA 139   K 3.5   CL 106   CO2 28   GLU 161*   BUN 22*   CREA 0.91   CA 8.6         Assessment / Plan:     POD 1 Day Post-Op    Procedure: Procedure(s):  SMALL BOWEL RESECTION X2, LYSIS OF ADHESIONS, DIAGNOSTIC LAPAROSCOPY CONVERTED TO OPEN      Clamp trial  Communicated with patient and nurse to do a 5hrs clamp trial  If >250cc come out of NGT when placed back on suction then NGT stays in place to LIWS.  If <250cc comes out then NGT may be dc/d  Continue IVF  Continue abx  D/C Foley catheter  Edwena Bunde, MD

## 2021-03-01 LAB — POCT GLUCOSE
POC Glucose: 126 mg/dL — ABNORMAL HIGH (ref 65–117)
POC Glucose: 126 mg/dL — ABNORMAL HIGH (ref 65–117)
POC Glucose: 129 mg/dL — ABNORMAL HIGH (ref 65–117)
POC Glucose: 128 mg/dL — ABNORMAL HIGH (ref 65–117)

## 2021-03-01 LAB — GLUCOSE, POC
Glucose (POC): 126 mg/dL — ABNORMAL HIGH (ref 65–117)
Glucose (POC): 126 mg/dL — ABNORMAL HIGH (ref 65–117)
Glucose (POC): 129 mg/dL — ABNORMAL HIGH (ref 65–117)
Glucose (POC): 128 mg/dL — ABNORMAL HIGH (ref 65–117)

## 2021-03-01 MED ORDER — KETOROLAC TROMETHAMINE 30 MG/ML INJECTION
30 mg/mL (1 mL) | Freq: Four times a day (QID) | INTRAMUSCULAR | Status: AC
Start: 2021-03-01 — End: 2021-03-04
  Administered 2021-03-01 – 2021-03-04 (×12): via INTRAVENOUS

## 2021-03-01 MED FILL — HEPARIN (PORCINE) 5,000 UNIT/ML IJ SOLN: 5000 unit/mL | INTRAMUSCULAR | Qty: 1

## 2021-03-01 MED FILL — HYDROMORPHONE (PF) 1 MG/ML IJ SOLN: 1 mg/mL | INTRAMUSCULAR | Qty: 1

## 2021-03-01 MED FILL — KETOROLAC TROMETHAMINE 15 MG/ML INJECTION: 15 mg/mL | INTRAMUSCULAR | Qty: 1

## 2021-03-01 MED FILL — KETOROLAC TROMETHAMINE 30 MG/ML INJECTION: 30 mg/mL (1 mL) | INTRAMUSCULAR | Qty: 1

## 2021-03-01 MED FILL — PROTONIX 40 MG INTRAVENOUS SOLUTION: 40 mg | INTRAVENOUS | Qty: 40

## 2021-03-01 MED FILL — PIPERACILLIN-TAZOBACTAM 3.375 GRAM IV SOLR: 3.375 gram | INTRAVENOUS | Qty: 3.38

## 2021-03-01 MED FILL — ACETAMINOPHEN 325 MG TABLET: 325 mg | ORAL | Qty: 2

## 2021-03-01 MED FILL — ONDANSETRON (PF) 4 MG/2 ML INJECTION: 4 mg/2 mL | INTRAMUSCULAR | Qty: 2

## 2021-03-01 MED FILL — LISINOPRIL 20 MG TAB: 20 mg | ORAL | Qty: 1

## 2021-03-01 NOTE — Progress Notes (Signed)
General Surgery Daily Progress Note    Patient: Alyssa [name] MRN: [id number]  SSN: [id number]    Date of Birth: [birth year]  Age: 40 y.o.  Sex: female      Admit Date: 02/23/2021    POD 3 Day Post-Op    Procedure: Procedure(s):  SMALL BOWEL RESECTION X2, LYSIS OF ADHESIONS, DIAGNOSTIC LAPAROSCOPY CONVERTED TO OPEN    Subjective:   Denies N/V- tolerating sips  Hungry  + flatus/ no BM  Pain at time of assessment      Current Facility-Administered Medications   Medication Dose Route Frequency    ketorolac (TORADOL) injection 15 mg  15 mg IntraVENous Q6H    hydrALAZINE (APRESOLINE) 20 mg/mL injection 20 mg  20 mg IntraVENous Q6H PRN    lactated Ringers infusion  125 mL/hr IntraVENous CONTINUOUS    HYDROmorphone (PF) (DILAUDID) injection 1 mg  1 mg IntraVENous Q3H PRN    pantoprazole (PROTONIX) 40 mg in 0.9% sodium chloride 10 mL injection  40 mg IntraVENous Q12H    phenol throat spray (CHLORASEPTIC) 1 Spray  1 Spray Oral PRN    lisinopriL (PRINIVIL, ZESTRIL) tablet 20 mg  20 mg Oral DAILY    insulin lispro (HUMALOG) injection   SubCUTAneous Q6H    glucose chewable tablet 16 g  4 Tablet Oral PRN    glucagon (GLUCAGEN) injection 1 mg  1 mg IntraMUSCular PRN    dextrose 10% infusion 0-250 mL  0-250 mL IntraVENous PRN    acetaminophen (TYLENOL) tablet 650 mg  650 mg Oral Q6H PRN    piperacillin-tazobactam (ZOSYN) 3.375 g in 0.9% sodium chloride (MBP/ADV) 100 mL MBP  3.375 g IntraVENous Q8H    ondansetron (ZOFRAN) injection 4 mg  4 mg IntraVENous Q4H PRN    heparin (porcine) injection 5,000 Units  5,000 Units SubCUTAneous Q8H        Objective:   No intake/output data recorded.  11/12 1901 - 11/14 0700  In: 1483 [I.V.:1483]  Out: 1600 [Urine:600]  Patient Vitals for the past 8 hrs:   BP Temp Pulse Resp SpO2   03/01/21 0808 128/79 98.4 ??F (36.9 ??C) 100 18 98 %   03/01/21 0433 (!) 146/82 99 ??F (37.2 ??C) (!) 108 18 92 %         Physical Exam:  General: Alert, crying, NAD  Lungs: Unlabored  Abdomen: Soft, obese, tender around  umbilicus, no distention. Incisions C/D/I  Extremities: Warm, moves all, no edema  Skin:  Warm and dry, no rash    Labs:   Recent Labs     02/28/21  0213   WBC 9.0   HGB 10.1*   HCT 33.1*   PLT 398       Recent Labs     02/28/21  0213   NA 139   K 3.5   CL 106   CO2 28   GLU 161*   BUN 22*   CREA 0.91   CA 8.6         Assessment / Plan:     Will advance diet to clears  Continue IVF  Continue abx  Must ambulate  Use IS hourly    Pt seen and discussed with Dr Wynetta Emery.  Milana Kidney, NP    agree

## 2021-03-01 NOTE — Progress Notes (Signed)
Rounding on unit: Alyssa [name] was sitting up in bed talking to her RN upon chaplain's arrival. She shared that she was still experience some pain, but was feeling better. [name] works for [employer] at [employer]. She talked about her recent surgery, family, and the need to take better care of self, so she can continue helping others. She is a member of [employer]; faith and prayer are her coping resources. Prayer provided per request.     Visited by: [name]. Alm [place], [place] paging Service [phone])

## 2021-03-01 NOTE — Progress Notes (Signed)
Problem: Falls - Risk of  Goal: *Absence of Falls  Description: Document Schmid Fall Risk and appropriate interventions in the flowsheet.  Outcome: Progressing Towards Goal  Note: Fall Risk Interventions:  Mobility Interventions: Bed/chair exit alarm, OT consult for ADLs, Patient to call before getting OOB, PT Consult for mobility concerns, PT Consult for assist device competence         Medication Interventions: Bed/chair exit alarm, Patient to call before getting OOB, Teach patient to arise slowly    Elimination Interventions: Bed/chair exit alarm, Call light in reach, Patient to call for help with toileting needs, Toileting schedule/hourly rounds

## 2021-03-01 NOTE — Progress Notes (Signed)
Problem: Falls - Risk of  Goal: *Absence of Falls  Description: Document Alyssa [name] Fall Risk and appropriate interventions in the flowsheet.  03/01/2021 0708 by Brayton Layman  Outcome: Progressing Towards Goal  Note: Fall Risk Interventions:  Mobility Interventions: Bed/chair exit alarm, OT consult for ADLs, Patient to call before getting OOB, PT Consult for mobility concerns, PT Consult for assist device competence         Medication Interventions: Bed/chair exit alarm, Patient to call before getting OOB, Teach patient to arise slowly    Elimination Interventions: Bed/chair exit alarm, Call light in reach, Patient to call for help with toileting needs, Toileting schedule/hourly rounds           03/01/2021 0707 by Brayton Layman  Outcome: Progressing Towards Goal  Note: Fall Risk Interventions:  Mobility Interventions: Bed/chair exit alarm, OT consult for ADLs, Patient to call before getting OOB, PT Consult for mobility concerns, PT Consult for assist device competence         Medication Interventions: Bed/chair exit alarm, Patient to call before getting OOB, Teach patient to arise slowly    Elimination Interventions: Bed/chair exit alarm, Call light in reach, Patient to call for help with toileting needs, Toileting schedule/hourly rounds

## 2021-03-01 NOTE — Progress Notes (Signed)
03/01/2021  2:42 PM  Update via chart review, pt is continuing to require medical management for s/p small bowel resections x2, lysis of adhesions, diagnostic laparoscopy converted to open.  Transitions of Care Plan:  RUR 9 % Low Risk of Readmission/Green  LOS 3 Days   Medical management continues   POD #3 small bowel resection x2 , lysis of adhesions  Advancing diet  OOB, ambulate  CM to follow through for treatment/response  DC when stable to home w/ family assistance   Outpatient follow up surgery,PCP  Family will transport at DC  CM will continue to follow and assist w/ DC needs   Jacinta Shoe, BS   Case Manager

## 2021-03-01 NOTE — Progress Notes (Signed)
Problem: Falls - Risk of  Goal: *Absence of Falls  Description: Document Alyssa [name] Fall Risk and appropriate interventions in the flowsheet.  Outcome: Progressing Towards Goal  Note: Fall Risk Interventions:  Mobility Interventions: Bed/chair exit alarm, OT consult for ADLs, Patient to call before getting OOB, PT Consult for mobility concerns, PT Consult for assist device competence         Medication Interventions: Patient to call before getting OOB, Teach patient to arise slowly    Elimination Interventions: Bed/chair exit alarm, Call light in reach, Patient to call for help with toileting needs, Toileting schedule/hourly rounds              Problem: Patient Education: Go to Patient Education Activity  Goal: Patient/Family Education  Outcome: Progressing Towards Goal     Problem: Pain  Goal: *Control of Pain  Outcome: Progressing Towards Goal  Goal: *PALLIATIVE CARE:  Alleviation of Pain  Outcome: Progressing Towards Goal     Problem: Patient Education: Go to Patient Education Activity  Goal: Patient/Family Education  Outcome: Progressing Towards Goal     Problem: Pressure Injury - Risk of  Goal: *Prevention of pressure injury  Description: Document [name] and appropriate interventions in the flowsheet.  Outcome: Progressing Towards Goal  Note: Pressure Injury Interventions:            Activity Interventions: Increase time out of bed, Assess need for specialty bed    Mobility Interventions: PT/OT evaluation, Turn and reposition approx. every two hours(pillow and wedges), Assess need for specialty bed    Nutrition Interventions: Document food/fluid/supplement intake                     Problem: Patient Education: Go to Patient Education Activity  Goal: Patient/Family Education  Outcome: Progressing Towards Goal

## 2021-03-02 LAB — POCT GLUCOSE
POC Glucose: 138 mg/dL — ABNORMAL HIGH (ref 65–117)
POC Glucose: 150 mg/dL — ABNORMAL HIGH (ref 65–117)
POC Glucose: 140 mg/dL — ABNORMAL HIGH (ref 65–117)
POC Glucose: 126 mg/dL — ABNORMAL HIGH (ref 65–117)

## 2021-03-02 LAB — GLUCOSE, POC
Glucose (POC): 138 mg/dL — ABNORMAL HIGH (ref 65–117)
Glucose (POC): 150 mg/dL — ABNORMAL HIGH (ref 65–117)
Glucose (POC): 140 mg/dL — ABNORMAL HIGH (ref 65–117)
Glucose (POC): 126 mg/dL — ABNORMAL HIGH (ref 65–117)

## 2021-03-02 MED FILL — KETOROLAC TROMETHAMINE 30 MG/ML INJECTION: 30 mg/mL (1 mL) | INTRAMUSCULAR | Qty: 1

## 2021-03-02 MED FILL — ACETAMINOPHEN 325 MG TABLET: 325 mg | ORAL | Qty: 2

## 2021-03-02 MED FILL — PIPERACILLIN-TAZOBACTAM 3.375 GRAM IV SOLR: 3.375 gram | INTRAVENOUS | Qty: 3.38

## 2021-03-02 MED FILL — HYDROMORPHONE (PF) 1 MG/ML IJ SOLN: 1 mg/mL | INTRAMUSCULAR | Qty: 1

## 2021-03-02 MED FILL — PROTONIX 40 MG INTRAVENOUS SOLUTION: 40 mg | INTRAVENOUS | Qty: 40

## 2021-03-02 MED FILL — INSULIN LISPRO 100 UNIT/ML INJECTION: 100 unit/mL | SUBCUTANEOUS | Qty: 1

## 2021-03-02 MED FILL — LISINOPRIL 20 MG TAB: 20 mg | ORAL | Qty: 1

## 2021-03-02 MED FILL — HEPARIN (PORCINE) 5,000 UNIT/ML IJ SOLN: 5000 unit/mL | INTRAMUSCULAR | Qty: 1

## 2021-03-02 MED FILL — HYDRALAZINE 20 MG/ML IJ SOLN: 20 mg/mL | INTRAMUSCULAR | Qty: 1

## 2021-03-02 NOTE — Progress Notes (Signed)
03/02/2021  1:34 PM  Care Management Progress Note      ICD-10-CM ICD-9-CM    1. Abdominal pain, generalized  R10.84 789.07           RUR:  9%  Risk Level: [x] Low [] Moderate [] High  Value-based purchasing: [x]  Yes []  No  Bundle patient: []  Yes [x]  No   Specify:     Transition of care plan:  Awaiting medical clearance and DC order. Awaiting bowel function return.   Home with family assistance as needed.   Outpatient follow-up.  Pt's family to transport.    Care Management Interventions  PCP Verified by CM: Yes  Mode of Transport at Discharge: Other (see comment)  MyChart Signup: No  Discharge Durable Medical Equipment: No  Physical Therapy Consult: No  Occupational Therapy Consult: No  Speech Therapy Consult: No  Support Systems: Child(ren)  Confirm Follow Up Transport: Family  Discharge Location  Patient Expects to be Discharged to:: Home

## 2021-03-02 NOTE — Progress Notes (Signed)
 Progress Notes  by Horald Krone at 03/02/21 1239                Author: Horald Krone  Service: Registered Dietitian or Nutrition Professional  Author Type: Dietetic Student       Filed: 03/02/21 1335  Date of Service: 03/02/21 1239  Status: Attested Addendum          Editor: Horald Krone (Dietetic Student)       Related Notes: Original Note by Horald Krone (Dietetic Student) filed at 03/02/21 1314          Cosigner: Perley Bergeron, RD at 03/02/21 1340          Attestation signed by Perley Bergeron, RD at 03/02/21 1340          RD Preceptor present for Dietetic Intern's assessment and evaluation of this patient. I discussed the findings, assessment and plan with the Dietetic Intern  and agree with their findings and plan as documented in their note.      Bergeron Perley, RD, MS   Contact via Perfect [employer] or office [phone]                                 Comprehensive Nutrition Assessment      Type and Reason for Visit: Initial, RD nutrition re-screen/LOS        Nutrition Recommendations/Plan:    1.  Advance to full liquid diet as tolerated - eventual goal regular 4 carb choice.   2.  Provide Gelatein once daily to increase kcal/protein intake (80 kcal, <1 g carbs, 20 g protein)         Malnutrition Assessment:   Malnutrition Status:  No malnutrition (03/02/21 1239)     Context:  Acute illness      Findings of the 6 clinical characteristics of malnutrition:    Energy Intake:  No significant decrease in energy intake   Weight Loss:  No significant weight loss      Body Fat Loss:  No significant body fat loss,      Muscle Mass Loss:  Unable to assess,     Fluid Accumulation:  No significant fluid accumulation,     Grip Strength:  Not performed       Nutrition Assessment:      Pt is a 40 yo female admitted with SBO (small bowel obstruction) (HCC) [K56.609]. 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   Intern screened for LOS. Pt is POD4 bowel resection. Pt reported appetite is great and she is ready to eat whole foods again. Pt is tolerating clear liquid diet well - advance diet to full liquid as able. Pt stated UBW is around 287#. Intern obtained  newly measured bed scale weight without blankets and pillows - 259.4#. Pt has lost 21# (7%) in 7 months, not clinically significant for time frame. Pt voiced acceptance of ONS - providing gelatein once a day to aid in healing. Pt reported nausea yesterday  after eating for the first time in a week but no nausea, vomiting or diarrhea today. Stated she had a BM yesterday. Per documentation, last BM was on 11/7 PTA. NKFA. No difficulty chewing/swallowing. Per Nutrition Focused Physical Exam  no clinical signs  of malnutrition.       Documented Meal intake:   Patient Vitals for the past 168 hrs:        % Diet Eaten        03/02/21 0723  76 - 100%        02/28/21 1222  0%     02/28/21 0819  0%     02/27/21 1645  0%     02/27/21 1141  0%     02/27/21 0809  0%     02/26/21 0836  0%     02/25/21 1621  0%     02/25/21 1151  0%        02/25/21 0810  0%              Nutrition Related Findings:       Wound Type: Surgical incision (Upper, mid abdomen)   Last Bowel Movement Date: 02/22/21   Stool Appearance: Mucous   Abdominal Assessment: Soft, Tender (midline and 3 lap sites incisions)   Appetite: Fair   Bowel Sounds: Active   Edema:Generalized: No Edema (03/01/2021  8:00 AM)        Nutr. Labs:     Lab Results         Component  Value  Date/Time            GFR est AA  >60  08/15/2020 09:21 AM       GFR est non-AA  >60  08/15/2020 09:21 AM       Creatinine  0.91  02/28/2021 02:13 AM       BUN  22 (H)  02/28/2021 02:13 AM       Sodium  139  02/28/2021 02:13 AM       Potassium  3.5  02/28/2021 02:13 AM        Chloride  106  02/28/2021 02:13 AM            CO2  28  02/28/2021 02:13 AM             Lab Results         Component  Value  Date/Time            Glucose  161 (H)  02/28/2021 02:13 AM            Glucose (POC)  140 (H)  03/02/2021 11:43 AM             Lab Results         Component  Value  Date/Time            Hemoglobin A1c  11.2 (H)  03/27/2020 09:03 AM            Hemoglobin A1c (POC)  9.4  01/21/2021 12:11 PM          Nutr. Meds:   Glucagon  PRN, heparin, apresoline PRN, humalog , LR @125ml /hr, lisinopril , zofran  PRN, protonix       Current Nutrition Intake & Therapies:   Average Meal Intake: 76-100%   Average Supplement Intake: None ordered   ADULT DIET Clear Liquid; No Concentrated Sweets      Anthropometric Measures:   Height: 5' 2.99 (160 cm)   Ideal Body Weight (IBW): 115 lbs (52 kg)    Current Body Wt:  117.7 kg (259 lb 6.4 oz), 225.6 % IBW. Bed scale   Current BMI (kg/m2): 46           Weight Adjustment: No adjustment  BMI Category: Obese class 3 (BMI 40.0 or greater)        Wt Readings from Last 10 Encounters:        02/26/21  127 kg (280 lb)     09/17/20  123.8 kg (273 lb)     08/15/20  127.1 kg (280 lb 3.3 oz)     07/14/20  127.9 kg (282 lb)     05/26/20  130.6 kg (288 lb)     05/19/20  130.6 kg (288 lb)     04/25/20  131.1 kg (289 lb 0.4 oz)     03/27/20  129.3 kg (285 lb)     01/15/20  127 kg (280 lb)        01/02/20  127 kg (280 lb)           Estimated Daily Nutrient Needs:   Energy Requirements Based On: Formula   Weight Used for Energy Requirements: Current   Energy (kcal/day): 2535 (MSJ x 1.4)   Weight Used for Protein Requirements: Current   Protein (g/day): 94-118 (0.8-1g/kg)   Method Used for Fluid Requirements: 1 ml/kcal   Fluid (ml/day): 2535 (78ml/kcal)      Nutrition Diagnosis:      Inadequate protein-energy intake related to acute injury/trauma, altered GI structure as evidenced by NPO or clear liquid status due to medical  condition, GI abnormality (POD4 bowel resection)         Nutrition Interventions:    Food and/or Nutrient Delivery: Modify current diet, Start oral nutrition supplement   Nutrition Education/Counseling: No recommendations at this time   Coordination of Nutrition Care: Continue to monitor while inpatient   Plan of Care discussed with: patient      Goals:    Goals: Meet at least 75% of estimated needs, by next RD assessment          Nutrition Monitoring and Evaluation:    Behavioral-Environmental Outcomes: None identified   Food/Nutrient Intake Outcomes: Diet advancement/tolerance, Food and nutrient intake, Supplement intake   Physical Signs/Symptoms Outcomes: Biochemical data, GI status, Nausea/vomiting, Weight      Discharge Planning:     Too soon to determine      Merilee Soulier   RD Intern   RD office: [phone]

## 2021-03-02 NOTE — Progress Notes (Signed)
Problem: Pain  Goal: *Control of Pain  Outcome: Progressing Towards Goal  Goal: *PALLIATIVE CARE:  Alleviation of Pain  Outcome: Progressing Towards Goal     Problem: Patient Education: Go to Patient Education Activity  Goal: Patient/Family Education  Outcome: Progressing Towards Goal     Problem: Pressure Injury - Risk of  Goal: *Prevention of pressure injury  Description: Document [name] and appropriate interventions in the flowsheet.  Outcome: Progressing Towards Goal  Note: Pressure Injury Interventions:            Activity Interventions: Increase time out of bed, PT/OT evaluation    Mobility Interventions: Float heels, HOB 30 degrees or less, PT/OT evaluation    Nutrition Interventions: Document food/fluid/supplement intake                     Problem: Patient Education: Go to Patient Education Activity  Goal: Patient/Family Education  Outcome: Progressing Towards Goal     Problem: Nutrition Deficit  Goal: *Optimize nutritional status  Outcome: Progressing Towards Goal

## 2021-03-02 NOTE — Progress Notes (Signed)
General Surgery Daily Progress Note    Patient: Alyssa [name] MRN: [id number]  SSN: [id number]    Date of Birth: 05/19/1980  Age: 40 y.o.  Sex: female      Admit Date: 02/23/2021    POD 4 Day Post-Op    Procedure: Procedure(s):  SMALL BOWEL RESECTION X2, LYSIS OF ADHESIONS, DIAGNOSTIC LAPAROSCOPY CONVERTED TO OPEN    Subjective:   Drinking liquids, abdomen cramping after eating.  BM x 2, voiding a lot.      Current Facility-Administered Medications   Medication Dose Route Frequency    ketorolac (TORADOL) injection 15 mg  15 mg IntraVENous Q6H    hydrALAZINE (APRESOLINE) 20 mg/mL injection 20 mg  20 mg IntraVENous Q6H PRN    lactated Ringers infusion  25 mL/hr IntraVENous CONTINUOUS    HYDROmorphone (PF) (DILAUDID) injection 1 mg  1 mg IntraVENous Q3H PRN    pantoprazole (PROTONIX) 40 mg in 0.9% sodium chloride 10 mL injection  40 mg IntraVENous Q12H    phenol throat spray (CHLORASEPTIC) 1 Spray  1 Spray Oral PRN    lisinopriL (PRINIVIL, ZESTRIL) tablet 20 mg  20 mg Oral DAILY    insulin lispro (HUMALOG) injection   SubCUTAneous Q6H    glucose chewable tablet 16 g  4 Tablet Oral PRN    glucagon (GLUCAGEN) injection 1 mg  1 mg IntraMUSCular PRN    dextrose 10% infusion 0-250 mL  0-250 mL IntraVENous PRN    acetaminophen (TYLENOL) tablet 650 mg  650 mg Oral Q6H PRN    piperacillin-tazobactam (ZOSYN) 3.375 g in 0.9% sodium chloride (MBP/ADV) 100 mL MBP  3.375 g IntraVENous Q8H    ondansetron (ZOFRAN) injection 4 mg  4 mg IntraVENous Q4H PRN    heparin (porcine) injection 5,000 Units  5,000 Units SubCUTAneous Q8H        Objective:   11/15 0701 - 11/15 1900  In: 240 [P.O.:240]  Out: 800 [Urine:800]  11/13 1901 - 11/15 0700  In: -   Out: 700 [Urine:700]  Patient Vitals for the past 8 hrs:   BP Temp Pulse Resp SpO2   03/02/21 1543 (!) 156/87 98.6 ??F (37 ??C) 96 18 95 %   03/02/21 1145 (!) 158/91 97.8 ??F (36.6 ??C) 93 18 95 %         Physical Exam:  General: Alert, crying, NAD  Lungs: Unlabored  Abdomen: Soft, obese, tender  around umbilicus, no distention. Incisions C/D/I with clips.  Extremities: Warm, moves all, no edema  Skin:  Warm and dry, no rash    Labs:   Recent Labs     02/28/21  0213   WBC 9.0   HGB 10.1*   HCT 33.1*   PLT 398       Recent Labs     02/28/21  0213   NA 139   K 3.5   CL 106   CO2 28   GLU 161*   BUN 22*   CREA 0.91   CA 8.6         Assessment / Plan:     Full liquids   Mobilize  IVF to 25cc/h

## 2021-03-02 NOTE — Progress Notes (Signed)
Problem: Falls - Risk of  Goal: *Absence of Falls  Description: Document Alyssa [name] Fall Risk and appropriate interventions in the flowsheet.  Outcome: Progressing Towards Goal  Note: Fall Risk Interventions:  Mobility Interventions: Bed/chair exit alarm, OT consult for ADLs, Patient to call before getting OOB, PT Consult for mobility concerns, PT Consult for assist device competence         Medication Interventions: Patient to call before getting OOB, Teach patient to arise slowly    Elimination Interventions: Bed/chair exit alarm, Call light in reach, Patient to call for help with toileting needs, Toileting schedule/hourly rounds              Problem: Patient Education: Go to Patient Education Activity  Goal: Patient/Family Education  Outcome: Progressing Towards Goal     Problem: Pain  Goal: *Control of Pain  Outcome: Progressing Towards Goal  Goal: *PALLIATIVE CARE:  Alleviation of Pain  Outcome: Progressing Towards Goal     Problem: Patient Education: Go to Patient Education Activity  Goal: Patient/Family Education  Outcome: Progressing Towards Goal     Problem: Pressure Injury - Risk of  Goal: *Prevention of pressure injury  Description: Document [name] and appropriate interventions in the flowsheet.  Outcome: Progressing Towards Goal  Note: Pressure Injury Interventions:            Activity Interventions: Increase time out of bed    Mobility Interventions: Assess need for specialty bed, Turn and reposition approx. every two hours(pillow and wedges)    Nutrition Interventions: Document food/fluid/supplement intake                     Problem: Patient Education: Go to Patient Education Activity  Goal: Patient/Family Education  Outcome: Progressing Towards Goal

## 2021-03-02 NOTE — Progress Notes (Signed)
Problem: Falls - Risk of  Goal: *Absence of Falls  Description: Document Alyssa [name] Fall Risk and appropriate interventions in the flowsheet.  Outcome: Progressing Towards Goal  Note: Fall Risk Interventions:  Mobility Interventions: Bed/chair exit alarm, OT consult for ADLs, Patient to call before getting OOB, PT Consult for mobility concerns, PT Consult for assist device competence         Medication Interventions: Patient to call before getting OOB, Teach patient to arise slowly    Elimination Interventions: Bed/chair exit alarm, Call light in reach, Patient to call for help with toileting needs, Toileting schedule/hourly rounds

## 2021-03-02 NOTE — Progress Notes (Signed)
 0330 Fellow RN Merlynn stated that while this Nurse was on break patient  IV pump stated disconnected. Fellow Nurse Merlynn stated that she put the channel back on the pump and then patient yelled at her that she was in pain.  Fellow Nurse Merlynn stated that she did not medicate patient.  This Nurse went to see patient and patient is noted to be resting quietly with eyes closed.  No c/o pain.  No acute distress noted. Callbell within reach.    0345 New bag IVF hung.  Patient remains quiet, eyes closed. Resp. E/U. No acute distress noted.

## 2021-03-03 ENCOUNTER — Inpatient Hospital Stay: Admit: 2021-03-03 | Payer: BLUE CROSS/BLUE SHIELD | Primary: Family Medicine

## 2021-03-03 LAB — POCT GLUCOSE
POC Glucose: 144 mg/dL — ABNORMAL HIGH (ref 65–117)
POC Glucose: 131 mg/dL — ABNORMAL HIGH (ref 65–117)
POC Glucose: 146 mg/dL — ABNORMAL HIGH (ref 65–117)
POC Glucose: 181 mg/dL — ABNORMAL HIGH (ref 65–117)

## 2021-03-03 LAB — GLUCOSE, POC
Glucose (POC): 144 mg/dL — ABNORMAL HIGH (ref 65–117)
Glucose (POC): 131 mg/dL — ABNORMAL HIGH (ref 65–117)
Glucose (POC): 146 mg/dL — ABNORMAL HIGH (ref 65–117)
Glucose (POC): 181 mg/dL — ABNORMAL HIGH (ref 65–117)

## 2021-03-03 MED ORDER — DIPHENHYDRAMINE HCL 50 MG/ML IJ SOLN
50 mg/mL | Freq: Once | INTRAMUSCULAR | Status: AC
Start: 2021-03-03 — End: 2021-03-03
  Administered 2021-03-03: 17:00:00 via INTRAVENOUS

## 2021-03-03 MED ORDER — IOPAMIDOL 76 % IV SOLN
76 % | Freq: Once | INTRAVENOUS | Status: AC
Start: 2021-03-03 — End: 2021-03-03
  Administered 2021-03-03: 18:00:00 via INTRAVENOUS

## 2021-03-03 MED FILL — HYDROMORPHONE (PF) 1 MG/ML IJ SOLN: 1 mg/mL | INTRAMUSCULAR | Qty: 1

## 2021-03-03 MED FILL — HEPARIN (PORCINE) 5,000 UNIT/ML IJ SOLN: 5000 unit/mL | INTRAMUSCULAR | Qty: 1

## 2021-03-03 MED FILL — HYDRALAZINE 20 MG/ML IJ SOLN: 20 mg/mL | INTRAMUSCULAR | Qty: 1

## 2021-03-03 MED FILL — ONDANSETRON (PF) 4 MG/2 ML INJECTION: 4 mg/2 mL | INTRAMUSCULAR | Qty: 2

## 2021-03-03 MED FILL — PROTONIX 40 MG INTRAVENOUS SOLUTION: 40 mg | INTRAVENOUS | Qty: 40

## 2021-03-03 MED FILL — LISINOPRIL 20 MG TAB: 20 mg | ORAL | Qty: 1

## 2021-03-03 MED FILL — KETOROLAC TROMETHAMINE 30 MG/ML INJECTION: 30 mg/mL (1 mL) | INTRAMUSCULAR | Qty: 1

## 2021-03-03 MED FILL — DIPHENHYDRAMINE HCL 50 MG/ML IJ SOLN: 50 mg/mL | INTRAMUSCULAR | Qty: 1

## 2021-03-03 MED FILL — PIPERACILLIN-TAZOBACTAM 3.375 GRAM IV SOLR: 3.375 gram | INTRAVENOUS | Qty: 3.38

## 2021-03-03 MED FILL — INSULIN LISPRO 100 UNIT/ML INJECTION: 100 unit/mL | SUBCUTANEOUS | Qty: 1

## 2021-03-03 NOTE — Progress Notes (Signed)
Patient has order for CT with contrast and allergy to contrast. In the past patient has taken benadryl and Zofran with contrast and did not experience any issues. Spoke with CT. Plan to medicate and complete CT ~ 12 with benadryl and Zofran    1120- Patient US guided IV infiltrated. Spoke with PICC team to see if they could place a new one and they are unable until around 1300.    RRT RN unable to place line at this time.     ED RN unable to do IV at this time.

## 2021-03-03 NOTE — Progress Notes (Signed)
General Surgery Daily Progress Note    Patient: Samanth Mirkin MRN: [id number]  SSN: [id number]    Date of Birth: 03/06/1981  Age: 40 y.o.  Sex: female      Admit Date: 02/23/2021    POD 5 Day Post-Op    Procedure: Procedure(s):  SMALL BOWEL RESECTION X2, LYSIS OF ADHESIONS, DIAGNOSTIC LAPAROSCOPY CONVERTED TO OPEN    Subjective:   Full liquids without n/v, abdomen cramping after eating.  Cramping subsides with pain medicine.  +Bms  Ambulating      Current Facility-Administered Medications   Medication Dose Route Frequency    diphenhydrAMINE (BENADRYL) injection 50 mg  50 mg IntraVENous ONCE    ketorolac (TORADOL) injection 15 mg  15 mg IntraVENous Q6H    hydrALAZINE (APRESOLINE) 20 mg/mL injection 20 mg  20 mg IntraVENous Q6H PRN    lactated Ringers infusion  25 mL/hr IntraVENous CONTINUOUS    HYDROmorphone (PF) (DILAUDID) injection 1 mg  1 mg IntraVENous Q3H PRN    pantoprazole (PROTONIX) 40 mg in 0.9% sodium chloride 10 mL injection  40 mg IntraVENous Q12H    phenol throat spray (CHLORASEPTIC) 1 Spray  1 Spray Oral PRN    lisinopriL (PRINIVIL, ZESTRIL) tablet 20 mg  20 mg Oral DAILY    insulin lispro (HUMALOG) injection   SubCUTAneous Q6H    glucose chewable tablet 16 g  4 Tablet Oral PRN    glucagon (GLUCAGEN) injection 1 mg  1 mg IntraMUSCular PRN    dextrose 10% infusion 0-250 mL  0-250 mL IntraVENous PRN    acetaminophen (TYLENOL) tablet 650 mg  650 mg Oral Q6H PRN    ondansetron (ZOFRAN) injection 4 mg  4 mg IntraVENous Q4H PRN    heparin (porcine) injection 5,000 Units  5,000 Units SubCUTAneous Q8H        Objective:   No intake/output data recorded.  11/14 1901 - 11/16 0700  In: 240 [P.O.:240]  Out: 1100 [Urine:1100]  Patient Vitals for the past 8 hrs:   BP Temp Pulse Resp SpO2   03/03/21 1129 (!) 158/98 98.6 ??F (37 ??C) 98 18 95 %   03/03/21 0800 (!) 141/98 99.2 ??F (37.3 ??C) 95 18 97 %         Physical Exam:  General: Alert, crying, NAD  Lungs: Unlabored  Abdomen: Soft, obese, tender around umbilicus, no  distention. Incisions C/D/I with clips.  Extremities: Warm, moves all, no edema  Skin:  Warm and dry, no rash    Labs:   No results for input(s): WBC, HGB, HCT, PLT, HGBEXT, HCTEXT, PLTEXT, HGBEXT, HCTEXT, PLTEXT in the last 72 hours.    No results for input(s): NA, K, CL, CO2, GLU, BUN, CREA, CA, MG, PHOS, ALB, TBIL, TBILI, ALT in the last 72 hours.    No lab exists for component: SGOT      Assessment / Plan:     Due to continued pain esp with PO intake will obtain CT ab/pelvis w/contrast today for eval  Full liquids on hold until CT complete  IVF  Mobilize    Pt seen and discussed with Dr Wynetta Emery.  His note to follow.  Milana Kidney, NP    Agree.  Imaging will be helpful.  She feels much better than prior to surgery

## 2021-03-03 NOTE — Progress Notes (Signed)
03/03/2021  1:34 PM  Care Management Progress Note      ICD-10-CM ICD-9-CM    1. Abdominal pain, generalized  R10.84 789.07           RUR:  7%  Risk Level: [x] Low [] Moderate [] High  Value-based purchasing: [x]  Yes []  No  Bundle patient: []  Yes [x]  No   Specify:     Transition of care plan:  Discharge pending diet tolerance and medical clearance.   Home with family assistance as needed.   Outpatient follow-up.  Pt's family to transport.    Care Management Interventions  PCP Verified by CM: Yes  Mode of Transport at Discharge: Other (see comment)  MyChart Signup: No  Discharge Durable Medical Equipment: No  Physical Therapy Consult: No  Occupational Therapy Consult: No  Speech Therapy Consult: No  Support Systems: Child(ren)  Confirm Follow Up Transport: Family  Discharge Location  Patient Expects to be Discharged to:: Home

## 2021-03-04 LAB — BASIC METABOLIC PANEL
Anion Gap: 6 mmol/L (ref 5–15)
BUN: 3 MG/DL — ABNORMAL LOW (ref 6–20)
Bun/Cre Ratio: 5 — ABNORMAL LOW (ref 12–20)
CO2: 30 mmol/L (ref 21–32)
Calcium: 9 MG/DL (ref 8.5–10.1)
Chloride: 102 mmol/L (ref 97–108)
Creatinine: 0.55 MG/DL (ref 0.55–1.02)
ESTIMATED GLOMERULAR FILTRATION RATE: >60 mL/min/{1.73_m2} (ref >60)
Glucose: 119 mg/dL — ABNORMAL HIGH (ref 65–100)
Potassium: 2.9 mmol/L — ABNORMAL LOW (ref 3.5–5.1)
Sodium: 138 mmol/L (ref 136–145)

## 2021-03-04 LAB — CBC WITH AUTO DIFFERENTIAL
Basophils %: 1 % (ref 0–1)
Basophils Absolute: 0.1 10*3/uL (ref 0.0–0.1)
Eosinophils %: 5 % (ref 0–7)
Eosinophils Absolute: 0.4 10*3/uL (ref 0.0–0.4)
Granulocyte Absolute Count: 0.2 10*3/uL — ABNORMAL HIGH (ref 0.00–0.04)
Hematocrit: 28.3 % — ABNORMAL LOW (ref 35.0–47.0)
Hemoglobin: 8.9 g/dL — ABNORMAL LOW (ref 11.5–16.0)
Immature Granulocytes: 3 % — ABNORMAL HIGH (ref 0.0–0.5)
Lymphocytes %: 23 % (ref 12–49)
Lymphocytes Absolute: 1.8 10*3/uL (ref 0.8–3.5)
MCH: 25.5 PG — ABNORMAL LOW (ref 26.0–34.0)
MCHC: 31.4 g/dL (ref 30.0–36.5)
MCV: 81.1 FL (ref 80.0–99.0)
MPV: 10.4 FL (ref 8.9–12.9)
Monocytes %: 8 % (ref 5–13)
Monocytes Absolute: 0.6 10*3/uL (ref 0.0–1.0)
NRBC Absolute: 0.02 10*3/uL — ABNORMAL HIGH (ref 0.00–0.01)
Neutrophils %: 60 % (ref 32–75)
Neutrophils Absolute: 4.9 10*3/uL (ref 1.8–8.0)
Nucleated RBCs: 0.3 PER 100 WBC — ABNORMAL HIGH
Platelets: 376 10*3/uL (ref 150–400)
RBC: 3.49 M/uL — ABNORMAL LOW (ref 3.80–5.20)
RDW: 15.6 % — ABNORMAL HIGH (ref 11.5–14.5)
WBC Comment: REACTIVE
WBC: 8 10*3/uL (ref 3.6–11.0)

## 2021-03-04 LAB — POCT GLUCOSE
POC Glucose: 110 mg/dL (ref 65–117)
POC Glucose: 140 mg/dL — ABNORMAL HIGH (ref 65–117)
POC Glucose: 122 mg/dL — ABNORMAL HIGH (ref 65–117)
POC Glucose: 143 mg/dL — ABNORMAL HIGH (ref 65–117)

## 2021-03-04 LAB — CBC WITH AUTOMATED DIFF
ABS. BASOPHILS: 0.1 10*3/uL (ref 0.0–0.1)
ABS. EOSINOPHILS: 0.4 10*3/uL (ref 0.0–0.4)
ABS. IMM. GRANS.: 0.2 10*3/uL — ABNORMAL HIGH (ref 0.00–0.04)
ABS. LYMPHOCYTES: 1.8 10*3/uL (ref 0.8–3.5)
ABS. MONOCYTES: 0.6 10*3/uL (ref 0.0–1.0)
ABS. NEUTROPHILS: 4.9 10*3/uL (ref 1.8–8.0)
ABSOLUTE NRBC: 0.02 10*3/uL — ABNORMAL HIGH (ref 0.00–0.01)
BASOPHILS: 1 % (ref 0–1)
EOSINOPHILS: 5 % (ref 0–7)
HCT: 28.3 % — ABNORMAL LOW (ref 35.0–47.0)
HGB: 8.9 g/dL — ABNORMAL LOW (ref 11.5–16.0)
IMMATURE GRANULOCYTES: 3 % — ABNORMAL HIGH (ref 0.0–0.5)
LYMPHOCYTES: 23 % (ref 12–49)
MCH: 25.5 PG — ABNORMAL LOW (ref 26.0–34.0)
MCHC: 31.4 g/dL (ref 30.0–36.5)
MCV: 81.1 FL (ref 80.0–99.0)
MONOCYTES: 8 % (ref 5–13)
MPV: 10.4 FL (ref 8.9–12.9)
NEUTROPHILS: 60 % (ref 32–75)
NRBC: 0.3 PER 100 WBC — ABNORMAL HIGH
PLATELET: 376 10*3/uL (ref 150–400)
RBC: 3.49 M/uL — ABNORMAL LOW (ref 3.80–5.20)
RDW: 15.6 % — ABNORMAL HIGH (ref 11.5–14.5)
WBC COMMENTS: REACTIVE
WBC: 8 10*3/uL (ref 3.6–11.0)

## 2021-03-04 LAB — GLUCOSE, POC
Glucose (POC): 110 mg/dL (ref 65–117)
Glucose (POC): 140 mg/dL — ABNORMAL HIGH (ref 65–117)
Glucose (POC): 122 mg/dL — ABNORMAL HIGH (ref 65–117)
Glucose (POC): 143 mg/dL — ABNORMAL HIGH (ref 65–117)

## 2021-03-04 LAB — METABOLIC PANEL, BASIC
Anion gap: 6 mmol/L (ref 5–15)
BUN/Creatinine ratio: 5 — ABNORMAL LOW (ref 12–20)
BUN: 3 MG/DL — ABNORMAL LOW (ref 6–20)
CO2: 30 mmol/L (ref 21–32)
Calcium: 9 MG/DL (ref 8.5–10.1)
Chloride: 102 mmol/L (ref 97–108)
Creatinine: 0.55 MG/DL (ref 0.55–1.02)
Glucose: 119 mg/dL — ABNORMAL HIGH (ref 65–100)
Potassium: 2.9 mmol/L — ABNORMAL LOW (ref 3.5–5.1)
Sodium: 138 mmol/L (ref 136–145)
eGFR: >60 mL/min/{1.73_m2} (ref >60)

## 2021-03-04 MED ORDER — PANTOPRAZOLE 40 MG TAB, DELAYED RELEASE
40 mg | Freq: Two times a day (BID) | ORAL | Status: AC
Start: 2021-03-04 — End: 2021-03-05
  Administered 2021-03-04 – 2021-03-05 (×2): via ORAL

## 2021-03-04 MED ORDER — OXYCODONE 5 MG TAB
5 mg | ORAL | Status: AC | PRN
Start: 2021-03-04 — End: 2021-03-08
  Administered 2021-03-04 – 2021-03-06 (×5): via ORAL

## 2021-03-04 MED ORDER — POTASSIUM CHLORIDE SR 10 MEQ TAB
10 mEq | ORAL | Status: AC
Start: 2021-03-04 — End: 2021-03-04
  Administered 2021-03-04: 20:00:00 via ORAL

## 2021-03-04 MED ORDER — OXYCODONE 5 MG TAB
5 mg | ORAL | Status: AC | PRN
Start: 2021-03-04 — End: 2021-03-09
  Administered 2021-03-07 – 2021-03-09 (×5): via ORAL

## 2021-03-04 MED ORDER — VALACYCLOVIR 500 MG TAB
500 mg | Freq: Two times a day (BID) | ORAL | Status: DC
Start: 2021-03-04 — End: 2021-03-09
  Administered 2021-03-04 – 2021-03-09 (×10): via ORAL

## 2021-03-04 MED FILL — VALACYCLOVIR 500 MG TAB: 500 mg | ORAL | Qty: 1

## 2021-03-04 MED FILL — HEPARIN (PORCINE) 5,000 UNIT/ML IJ SOLN: 5000 unit/mL | INTRAMUSCULAR | Qty: 1

## 2021-03-04 MED FILL — INSULIN LISPRO 100 UNIT/ML INJECTION: 100 unit/mL | SUBCUTANEOUS | Qty: 1

## 2021-03-04 MED FILL — PANTOPRAZOLE 40 MG TAB, DELAYED RELEASE: 40 mg | ORAL | Qty: 1

## 2021-03-04 MED FILL — OXYCODONE 5 MG TAB: 5 mg | ORAL | Qty: 2

## 2021-03-04 MED FILL — ONDANSETRON (PF) 4 MG/2 ML INJECTION: 4 mg/2 mL | INTRAMUSCULAR | Qty: 2

## 2021-03-04 MED FILL — KETOROLAC TROMETHAMINE 30 MG/ML INJECTION: 30 mg/mL (1 mL) | INTRAMUSCULAR | Qty: 1

## 2021-03-04 MED FILL — HYDROMORPHONE (PF) 1 MG/ML IJ SOLN: 1 mg/mL | INTRAMUSCULAR | Qty: 1

## 2021-03-04 MED FILL — POTASSIUM CHLORIDE SR 10 MEQ TAB: 10 mEq | ORAL | Qty: 4

## 2021-03-04 MED FILL — PROTONIX 40 MG INTRAVENOUS SOLUTION: 40 mg | INTRAVENOUS | Qty: 40

## 2021-03-04 MED FILL — LISINOPRIL 20 MG TAB: 20 mg | ORAL | Qty: 1

## 2021-03-04 NOTE — Progress Notes (Signed)
[facility] Dosing Services: IV to PO Conversion    The pharmacist has determined that this patient meets P & T approved criteria for conversion from IV to oral therapy for the following medication:Pantoprazole 40 mg IV q12hr    The pharmacist has written the following order for the patient: Pantoprazole 40 mg po q12hr  The pharmacist will continue to monitor the patient's status and advise the physician if conversion back to IV therapy is recommended.    Signed Marilynne Halsted Contact information:  [phone]

## 2021-03-04 NOTE — Progress Notes (Signed)
General Surgery Daily Progress Note    Patient: Alyssa [name] MRN: [id number]  SSN: [id number]    Date of Birth: 01/05/1981  Age: 40 y.o.  Sex: female      Admit Date: 02/23/2021    POD 6 Day Post-Op    Procedure: Procedure(s):  SMALL BOWEL RESECTION X2, LYSIS OF ADHESIONS, DIAGNOSTIC LAPAROSCOPY CONVERTED TO OPEN    Subjective:   States pain 7/82 usually umbilicus to left lower abd burning and cramping  No n/v  No flatus or Bms  Ambulating hallways  Taking zofran around the clock and pt telling me she was scheduled oxycodone now instead of dilaudid so she will start that next time she needs pain    Current Facility-Administered Medications   Medication Dose Route Frequency    oxyCODONE IR (ROXICODONE) tablet 5 mg  5 mg Oral Q4H PRN    Or    oxyCODONE IR (ROXICODONE) tablet 10 mg  10 mg Oral Q4H PRN    hydrALAZINE (APRESOLINE) 20 mg/mL injection 20 mg  20 mg IntraVENous Q6H PRN    lactated Ringers infusion  25 mL/hr IntraVENous CONTINUOUS    HYDROmorphone (PF) (DILAUDID) injection 1 mg  1 mg IntraVENous Q3H PRN    pantoprazole (PROTONIX) 40 mg in 0.9% sodium chloride 10 mL injection  40 mg IntraVENous Q12H    phenol throat spray (CHLORASEPTIC) 1 Spray  1 Spray Oral PRN    lisinopriL (PRINIVIL, ZESTRIL) tablet 20 mg  20 mg Oral DAILY    insulin lispro (HUMALOG) injection   SubCUTAneous Q6H    glucose chewable tablet 16 g  4 Tablet Oral PRN    glucagon (GLUCAGEN) injection 1 mg  1 mg IntraMUSCular PRN    dextrose 10% infusion 0-250 mL  0-250 mL IntraVENous PRN    acetaminophen (TYLENOL) tablet 650 mg  650 mg Oral Q6H PRN    ondansetron (ZOFRAN) injection 4 mg  4 mg IntraVENous Q4H PRN    heparin (porcine) injection 5,000 Units  5,000 Units SubCUTAneous Q8H        Objective:   No intake/output data recorded.  11/15 1901 - 11/17 0700  In: 6920 [P.O.:200; I.V.:6720]  Out: -   Patient Vitals for the past 8 hrs:   BP Temp Pulse Resp SpO2   03/04/21 0859 136/88 98.4 ??F (36.9 ??C) 87 18 97 %         Physical  Exam:  General: Alert, crying, NAD  Lungs: Unlabored  Abdomen: Soft, obese, tender throughout but most around umbilicus, no distention. Incisions C/D/I with clips.  Extremities: Warm, moves all, no edema  Skin:  Warm and dry, no rash    Labs:   Recent Labs     03/04/21  0837   WBC 8.0   HGB 8.9*   HCT 28.3*   PLT 376       Recent Labs     03/04/21  0837   NA 138   K 2.9*   CL 102   CO2 30   GLU 119*   BUN 3*   CREA 0.55   CA 9.0         Assessment / Plan:     CT results reviewed with Dr Wynetta Emery- no need for surgical intervention at this time.  Will cont to monitor  Gi lite diet  Pt to Decrease frequency of zofran   Switch to oral pain meds  If pain continues can try gabapentin as this pain may be nerve related due to nature  of pain and pt hx of DM  IVF  Mobilize    Pt seen and discussed with Dr Wynetta Emery.  His note to follow.  Milana Kidney, NP

## 2021-03-04 NOTE — Progress Notes (Signed)
03/04/2021  1:34 PM  Care Management Progress Note      ICD-10-CM ICD-9-CM    1. Abdominal pain, generalized  R10.84 789.07           RUR:  7%  Risk Level: [x] Low [] Moderate [] High  Value-based purchasing: [x]  Yes []  No  Bundle patient: []  Yes [x]  No   Specify:     Transition of care plan:  Discharge pending diet tolerance and medical clearance.   Home with family assistance as needed.   Outpatient follow-up.  Pt's family to transport.

## 2021-03-04 NOTE — Progress Notes (Signed)
General Surgery Daily Progress Note    Patient: Alyssa [name] MRN: [id number]  SSN: [id number]    Date of Birth: [birth year]  Age: 40 y.o.  Sex: female      Admit Date: 02/23/2021    POD 6 Day Post-Op    Procedure: Procedure(s):  SMALL BOWEL RESECTION X2, LYSIS OF ADHESIONS, DIAGNOSTIC LAPAROSCOPY CONVERTED TO OPEN    Subjective:   States pain 6/94 usually umbilicus to left lower abd burning and cramping  No n/v  No flatus or Bms  Ambulating hallways  Taking zofran around the clock and pt telling me she was scheduled oxycodone now instead of dilaudid so she will start that next time she needs pain    Current Facility-Administered Medications   Medication Dose Route Frequency    oxyCODONE IR (ROXICODONE) tablet 5 mg  5 mg Oral Q4H PRN    Or    oxyCODONE IR (ROXICODONE) tablet 10 mg  10 mg Oral Q4H PRN    valACYclovir (VALTREX) tablet 500 mg  500 mg Oral BID    pantoprazole (PROTONIX) tablet 40 mg  40 mg Oral ACB&D    hydrALAZINE (APRESOLINE) 20 mg/mL injection 20 mg  20 mg IntraVENous Q6H PRN    lactated Ringers infusion  25 mL/hr IntraVENous CONTINUOUS    HYDROmorphone (PF) (DILAUDID) injection 1 mg  1 mg IntraVENous Q3H PRN    phenol throat spray (CHLORASEPTIC) 1 Spray  1 Spray Oral PRN    lisinopriL (PRINIVIL, ZESTRIL) tablet 20 mg  20 mg Oral DAILY    insulin lispro (HUMALOG) injection   SubCUTAneous Q6H    glucose chewable tablet 16 g  4 Tablet Oral PRN    glucagon (GLUCAGEN) injection 1 mg  1 mg IntraMUSCular PRN    dextrose 10% infusion 0-250 mL  0-250 mL IntraVENous PRN    acetaminophen (TYLENOL) tablet 650 mg  650 mg Oral Q6H PRN    ondansetron (ZOFRAN) injection 4 mg  4 mg IntraVENous Q4H PRN    heparin (porcine) injection 5,000 Units  5,000 Units SubCUTAneous Q8H        Objective:   11/17 0701 - 11/17 1900  In: 480 [P.O.:480]  Out: -   11/15 1901 - 11/17 0700  In: 6920 [P.O.:200; I.V.:6720]  Out: -   Patient Vitals for the past 8 hrs:   BP Temp Pulse Resp SpO2   03/04/21 1637 (!) 152/97 98.3 ??F (36.8 ??C) 83  18 94 %   03/04/21 1204 (!) 164/98 98.6 ??F (37 ??C) 93 18 96 %         Physical Exam:  General: Alert, crying, NAD  Lungs: Unlabored  Abdomen: Soft, obese, tender throughout but most around umbilicus, no distention. Incisions C/D/I with clips.  Extremities: Warm, moves all, no edema  Skin:  Warm and dry, no rash    Labs:   Recent Labs     03/04/21  0837   WBC 8.0   HGB 8.9*   HCT 28.3*   PLT 376       Recent Labs     03/04/21  0837   NA 138   K 2.9*   CL 102   CO2 30   GLU 119*   BUN 3*   CREA 0.55   CA 9.0         Assessment / Plan:     CT results reviewed with Dr Wynetta Emery- no need for surgical intervention at this time.  Will cont to monitor  Gi lite diet  Pt to Decrease frequency of zofran   Switch to oral pain meds  If pain continues can try gabapentin as this pain may be nerve related due to nature of pain and pt hx of DM  IVF  Mobilize    Pt seen and discussed with Dr Wynetta Emery.  His note to follow.  Arnetha Courser, MD    CT scan, labs are reassuring.  Multiple BM, pain improved prior to surgery.  Working on pain control prior to d/c .

## 2021-03-05 LAB — POCT GLUCOSE
POC Glucose: 152 mg/dL — ABNORMAL HIGH (ref 65–117)
POC Glucose: 157 mg/dL — ABNORMAL HIGH (ref 65–117)
POC Glucose: 214 mg/dL — ABNORMAL HIGH (ref 65–117)
POC Glucose: 131 mg/dL — ABNORMAL HIGH (ref 65–117)

## 2021-03-05 LAB — HEMOGLOBIN AND HEMATOCRIT
Hematocrit: 34.7 % — ABNORMAL LOW (ref 35.0–47.0)
Hemoglobin: 11 g/dL — ABNORMAL LOW (ref 11.5–16.0)

## 2021-03-05 LAB — GLUCOSE, POC
Glucose (POC): 152 mg/dL — ABNORMAL HIGH (ref 65–117)
Glucose (POC): 157 mg/dL — ABNORMAL HIGH (ref 65–117)
Glucose (POC): 214 mg/dL — ABNORMAL HIGH (ref 65–117)
Glucose (POC): 131 mg/dL — ABNORMAL HIGH (ref 65–117)

## 2021-03-05 LAB — HGB & HCT
HCT: 34.7 % — ABNORMAL LOW (ref 35.0–47.0)
HGB: 11 g/dL — ABNORMAL LOW (ref 11.5–16.0)

## 2021-03-05 MED ORDER — PANTOPRAZOLE 40 MG IV SOLR
40 mg | Freq: Two times a day (BID) | INTRAVENOUS | Status: AC
Start: 2021-03-05 — End: 2021-03-09
  Administered 2021-03-06 – 2021-03-09 (×8): via INTRAVENOUS

## 2021-03-05 MED ORDER — POTASSIUM CHLORIDE SR 10 MEQ TAB
10 mEq | ORAL | Status: AC
Start: 2021-03-05 — End: 2021-03-05
  Administered 2021-03-05: 13:00:00 via ORAL

## 2021-03-05 MED ORDER — POTASSIUM CHLORIDE 10 MEQ/100 ML IV PIGGY BACK
10 mEq/0 mL | INTRAVENOUS | Status: AC
Start: 2021-03-05 — End: 2021-03-05
  Administered 2021-03-05 (×2): via INTRAVENOUS

## 2021-03-05 MED ORDER — GABAPENTIN 300 MG CAP
300 mg | Freq: Every evening | ORAL | Status: DC
Start: 2021-03-05 — End: 2021-03-09
  Administered 2021-03-07 – 2021-03-09 (×3): via ORAL

## 2021-03-05 MED ORDER — PROCHLORPERAZINE EDISYLATE 5 MG/ML INJECTION
5 mg/mL | Freq: Four times a day (QID) | INTRAMUSCULAR | Status: AC | PRN
Start: 2021-03-05 — End: 2021-03-09
  Administered 2021-03-05 – 2021-03-08 (×6): via INTRAVENOUS

## 2021-03-05 MED ORDER — KETOROLAC TROMETHAMINE 30 MG/ML INJECTION
30 mg/mL (1 mL) | Freq: Four times a day (QID) | INTRAMUSCULAR | Status: AC
Start: 2021-03-05 — End: 2021-03-08
  Administered 2021-03-05 – 2021-03-08 (×12): via INTRAVENOUS

## 2021-03-05 MED FILL — OXYCODONE 5 MG TAB: 5 mg | ORAL | Qty: 2

## 2021-03-05 MED FILL — HYDROMORPHONE (PF) 1 MG/ML IJ SOLN: 1 mg/mL | INTRAMUSCULAR | Qty: 1

## 2021-03-05 MED FILL — ONDANSETRON (PF) 4 MG/2 ML INJECTION: 4 mg/2 mL | INTRAMUSCULAR | Qty: 2

## 2021-03-05 MED FILL — PROCHLORPERAZINE EDISYLATE 5 MG/ML INJECTION: 5 mg/mL | INTRAMUSCULAR | Qty: 2

## 2021-03-05 MED FILL — KETOROLAC TROMETHAMINE 30 MG/ML INJECTION: 30 mg/mL (1 mL) | INTRAMUSCULAR | Qty: 1

## 2021-03-05 MED FILL — HEPARIN (PORCINE) 5,000 UNIT/ML IJ SOLN: 5000 unit/mL | INTRAMUSCULAR | Qty: 1

## 2021-03-05 MED FILL — POTASSIUM CHLORIDE 10 MEQ/100 ML IV PIGGY BACK: 10 mEq/0 mL | INTRAVENOUS | Qty: 100

## 2021-03-05 MED FILL — POTASSIUM CHLORIDE SR 10 MEQ TAB: 10 mEq | ORAL | Qty: 4

## 2021-03-05 MED FILL — VALACYCLOVIR 500 MG TAB: 500 mg | ORAL | Qty: 1

## 2021-03-05 MED FILL — PANTOPRAZOLE 40 MG TAB, DELAYED RELEASE: 40 mg | ORAL | Qty: 1

## 2021-03-05 MED FILL — LISINOPRIL 20 MG TAB: 20 mg | ORAL | Qty: 1

## 2021-03-05 MED FILL — INSULIN LISPRO 100 UNIT/ML INJECTION: 100 unit/mL | SUBCUTANEOUS | Qty: 1

## 2021-03-05 NOTE — Progress Notes (Signed)
 Comprehensive Nutrition Assessment    Type and Reason for Visit: Initial, RD nutrition re-screen/LOS    Nutrition Recommendations/Plan:   Advance to Clears/full liquid diet as tolerated - eventual goal regular 4 carb choice.  Monitor Gi status  Consult if alternate means of nutrition needed.      Malnutrition Assessment:  Malnutrition Status:  No malnutrition (03/02/21 1239)    Context:  Acute illness     Findings of the 6 clinical characteristics of malnutrition:   Energy Intake:  No significant decrease in energy intake  Weight Loss:  No significant weight loss     Body Fat Loss:  No significant body fat loss,     Muscle Mass Loss:  Unable to assess,    Fluid Accumulation:  No significant fluid accumulation,    Grip Strength:  Not performed     Nutrition Assessment:     Pt is a 40 yo female admitted with SBO (small bowel obstruction) (HCC) [K56.609]. 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    11/18: Follow up. Pt with emesis earlier today, made NPO again. POD 7. Monitor ability to tolerate PO. Monitor plan of care. No surgical intervention planned per surgery 11/17. Monitor if pt might benefit from NG tube to suction if pt continues to vomit.     Pt screened for LOS. Pt is POD4 bowel resection. Pt reported appetite is great and she is ready to eat whole foods again. Pt is tolerating clear liquid diet well - advance diet to full liquid as able. Pt stated UBW is around 287#. Intern obtained newly measured bed scale weight without blankets and pillows - 259.4#. Pt has lost 21# (7%) in 7 months, not clinically significant for time frame. Pt voiced acceptance of ONS - providing gelatein once a day to aid in healing. Pt reported  nausea yesterday after eating for the first time in a week but no nausea, vomiting or diarrhea today. Stated she had a BM yesterday. Per documentation, last BM was on 11/7 PTA. NKFA. No difficulty chewing/swallowing. Per Nutrition Focused Physical Exam no clinical signs of malnutrition.     Documented Meal intake:  Patient Vitals for the past 168 hrs:   % Diet Eaten   03/05/21 1152 0%   03/05/21 0816 0%   03/04/21 1613 76 - 100%   03/04/21 1213 76 - 100%   03/04/21 0912 51 - 75%   03/04/21 0700 0%   03/03/21 1626 1 - 25%   03/03/21 1140 1 - 25%   03/03/21 1024 0%   03/03/21 0800 0%   03/02/21 1250 51 - 75%   03/02/21 0723 76 - 100%   02/28/21 1222 0%   02/28/21 0819 0%   02/27/21 1645 0%   02/27/21 1141 0%   02/27/21 0809 0%           Nutrition Related Findings:      Wound Type: Surgical incision (Upper, mid abdomen)  Last Bowel Movement Date: 03/04/21  Stool Appearance: Soft  Abdominal Assessment: Distended, Not passing flatus, Tender  Appetite: NPO  Bowel Sounds: Hypoactive   Edema:Generalized: No Edema (03/04/2021  4:13 PM)      Nutr. Labs:  Lab Results   Component Value Date/Time    GFR est AA >60 08/15/2020  09:21 AM    GFR est non-AA >60 08/15/2020 09:21 AM    Creatinine 0.55 03/04/2021 08:37 AM    BUN 3 (L) 03/04/2021 08:37 AM    Sodium 138 03/04/2021 08:37 AM    Potassium 2.9 (L) 03/04/2021 08:37 AM    Chloride 102 03/04/2021 08:37 AM    CO2 30 03/04/2021 08:37 AM       Lab Results   Component Value Date/Time    Glucose 119 (H) 03/04/2021 08:37 AM    Glucose (POC) 214 (H) 03/05/2021 11:33 AM       Lab Results   Component Value Date/Time    Hemoglobin A1c 11.2 (H) 03/27/2020 09:03 AM    Hemoglobin A1c (POC) 9.4 01/21/2021 12:11 PM       Nutr. Meds:  Current Facility-Administered Medications   Medication Dose Route Frequency    ketorolac  (TORADOL ) injection 15 mg  15 mg IntraVENous Q6H    gabapentin  (NEURONTIN ) capsule 300 mg  300 mg Oral QHS    pantoprazole  (PROTONIX ) 40 mg in 0.9% sodium chloride  10 mL  injection  40 mg IntraVENous Q12H    valACYclovir  (VALTREX ) tablet 500 mg  500 mg Oral BID    lactated Ringers  infusion  100 mL/hr IntraVENous CONTINUOUS    lisinopriL  (PRINIVIL , ZESTRIL ) tablet 20 mg  20 mg Oral DAILY    insulin  lispro (HUMALOG ) injection   SubCUTAneous Q6H    heparin (porcine) injection 5,000 Units  5,000 Units SubCUTAneous Q8H         Current Nutrition Intake & Therapies:  Average Meal Intake: 76-100%  Average Supplement Intake: None ordered  DIET NPO Sips of Water  with Meds, Ice Chips, Sips of Clear Liquids    Anthropometric Measures:  Height: 5' 2.99 (160 cm)  Ideal Body Weight (IBW): 115 lbs (52 kg)   Current Body Wt:  117.7 kg (259 lb 6.4 oz), 225.6 % IBW. Bed scale  Current BMI (kg/m2): 46        Weight Adjustment: No adjustment         BMI Category: Obese class 3 (BMI 40.0 or greater)    Wt Readings from Last 10 Encounters:   02/26/21 127 kg (280 lb)   09/17/20 123.8 kg (273 lb)   08/15/20 127.1 kg (280 lb 3.3 oz)   07/14/20 127.9 kg (282 lb)   05/26/20 130.6 kg (288 lb)   05/19/20 130.6 kg (288 lb)   04/25/20 131.1 kg (289 lb 0.4 oz)   03/27/20 129.3 kg (285 lb)   01/15/20 127 kg (280 lb)   01/02/20 127 kg (280 lb)       Estimated Daily Nutrient Needs:  Energy Requirements Based On: Formula  Weight Used for Energy Requirements: Current  Energy (kcal/day): 2535 (MSJ x 1.4)  Weight Used for Protein Requirements: Current  Protein (g/day): 94-118 (0.8-1g/kg)  Method Used for Fluid Requirements: 1 ml/kcal  Fluid (ml/day): 2535 (48ml/kcal)    Nutrition Diagnosis:   Inadequate protein-energy intake related to acute injury/trauma, altered GI structure as evidenced by NPO or clear liquid status due to medical condition, GI abnormality (POD4 bowel resection)      Nutrition Interventions:   Food and/or Nutrient Delivery: Modify current diet, Start oral nutrition supplement  Nutrition Education/Counseling: No recommendations at this time  Coordination of Nutrition Care: Continue to monitor while  inpatient  Plan of Care discussed with: patient    Goals:   Goals: Meet at least 75% of estimated needs, by next RD assessment  Nutrition Monitoring and Evaluation:   Behavioral-Environmental Outcomes: None identified  Food/Nutrient Intake Outcomes: Diet advancement/tolerance, Food and nutrient intake, Supplement intake  Physical Signs/Symptoms Outcomes: Biochemical data, GI status, Nausea/vomiting, Weight    Discharge Planning:    Too soon to determine    Darryle DELENA Sharps, RD  RD office: [phone]

## 2021-03-05 NOTE — Telephone Encounter (Signed)
Pt is calling from her hospital bed, crying and very upset.   She has been in hospital for two weeks with bowl obstructions, surgery.   She says that it doesn't seem like the drs know what is really wrong with her and that she would like someone to help her get a second opinion.   She is feeling very defeated.    JR

## 2021-03-05 NOTE — Progress Notes (Signed)
Pt reports pain controlled as long as pt does not talk. Pt reports that talking causes vibrations in abd that cause pain to go up.     0820 Notified NP of absent bowel sounds, increased nausea and pain.     1000 Notified NP of 400cc emesis.     1500 Bedside shift report given to O'Donnell.

## 2021-03-05 NOTE — Progress Notes (Signed)
Problem: Falls - Risk of  Goal: *Absence of Falls  Description: Document Patrcia Dolly Fall Risk and appropriate interventions in the flowsheet.  Outcome: Progressing Towards Goal  Note: Fall Risk Interventions:  Mobility Interventions: Assess mobility with egress test         Medication Interventions: Teach patient to arise slowly    Elimination Interventions: Call light in reach, Toilet paper/wipes in reach    History of Falls Interventions: Bed/chair exit alarm, Evaluate medications/consider consulting pharmacy         Problem: Pain  Goal: *Control of Pain  03/05/2021 1607 by Case, Ysidro Evert, RN  Outcome: Progressing Towards Goal  03/05/2021 1607 by Case, Ysidro Evert, RN  Outcome: Progressing Towards Goal     Problem: Pressure Injury - Risk of  Goal: *Prevention of pressure injury  Description: Document [name] and appropriate interventions in the flowsheet.  Outcome: Progressing Towards Goal  Note: Pressure Injury Interventions:            Activity Interventions: Increase time out of bed, Pressure redistribution bed/mattress(bed type)    Mobility Interventions: HOB 30 degrees or less    Nutrition Interventions: Document food/fluid/supplement intake, Offer support with meals,snacks and hydration

## 2021-03-05 NOTE — Progress Notes (Signed)
RRT RN rounded on patient for a MEWS of 4, HR 120. Patient resting in bed with lights dimmed and eyes closed. HR now 103. Talking on phone in no acute distress. No further interventions warranted at this time.

## 2021-03-05 NOTE — Telephone Encounter (Signed)
Called pt, and left a detailed voice message.     Advised pt, I apologize for her frustration, but she needs to express her concerns to her attending provider, and if unhappy with his/her advice/recommendation going forward then ask to speak with Stephania Fragmin, patient advocate.     Advised pt, Ivin Booty would be the person to speak with if a second opinion is wanted.

## 2021-03-05 NOTE — Progress Notes (Signed)
03/05/2021  1:34 PM  Care Management Progress Note      ICD-10-CM ICD-9-CM    1. Abdominal pain, generalized  R10.84 789.07           RUR:  7%  Risk Level: [x] Low [] Moderate [] High  Value-based purchasing: [x]  Yes []  No  Bundle patient: []  Yes [x]  No   Specify:     Transition of care plan:  Discharge pending pain control and medical clearance. Pt ambulating in halls.   Home with family assistance as needed.   Outpatient follow-up.  Pt's family to transport.

## 2021-03-06 LAB — POCT GLUCOSE
POC Glucose: 123 mg/dL — ABNORMAL HIGH (ref 65–117)
POC Glucose: 154 mg/dL — ABNORMAL HIGH (ref 65–117)
POC Glucose: 143 mg/dL — ABNORMAL HIGH (ref 65–117)
POC Glucose: 158 mg/dL — ABNORMAL HIGH (ref 65–117)

## 2021-03-06 LAB — GLUCOSE, POC
Glucose (POC): 123 mg/dL — ABNORMAL HIGH (ref 65–117)
Glucose (POC): 154 mg/dL — ABNORMAL HIGH (ref 65–117)
Glucose (POC): 143 mg/dL — ABNORMAL HIGH (ref 65–117)
Glucose (POC): 158 mg/dL — ABNORMAL HIGH (ref 65–117)

## 2021-03-06 MED FILL — HYDROMORPHONE (PF) 1 MG/ML IJ SOLN: 1 mg/mL | INTRAMUSCULAR | Qty: 1

## 2021-03-06 MED FILL — PROCHLORPERAZINE EDISYLATE 5 MG/ML INJECTION: 5 mg/mL | INTRAMUSCULAR | Qty: 2

## 2021-03-06 MED FILL — KETOROLAC TROMETHAMINE 30 MG/ML INJECTION: 30 mg/mL (1 mL) | INTRAMUSCULAR | Qty: 1

## 2021-03-06 MED FILL — VALACYCLOVIR 500 MG TAB: 500 mg | ORAL | Qty: 1

## 2021-03-06 MED FILL — PROTONIX 40 MG INTRAVENOUS SOLUTION: 40 mg | INTRAVENOUS | Qty: 40

## 2021-03-06 MED FILL — HEPARIN (PORCINE) 5,000 UNIT/ML IJ SOLN: 5000 unit/mL | INTRAMUSCULAR | Qty: 1

## 2021-03-06 MED FILL — LISINOPRIL 20 MG TAB: 20 mg | ORAL | Qty: 1

## 2021-03-06 MED FILL — INSULIN LISPRO 100 UNIT/ML INJECTION: 100 unit/mL | SUBCUTANEOUS | Qty: 1

## 2021-03-06 MED FILL — OXYCODONE 5 MG TAB: 5 mg | ORAL | Qty: 2

## 2021-03-06 MED FILL — GABAPENTIN 300 MG CAP: 300 mg | ORAL | Qty: 1

## 2021-03-06 NOTE — Progress Notes (Signed)
Ultrasound IV by Angelique Blonder, RN :  Procedure Note    Patient meets criteria for [place] IV insertion.     Ultrasound IV education provided to patient. Opportunities for questions given.      Ultrasound used for PIV placement:  20gauge 8 cm Arrow Endurance Extended Dwell(may stay in place for 29 days)  Left forearm location.  1 X Attempt(s).    Flushed with ease; vigorous blood return.     Procedure tolerated well. Primary RN aware of IV placement and added to LDA.      Angelique Blonder, RN

## 2021-03-06 NOTE — Progress Notes (Signed)
Progress Note    Patient: Alyssa [name] MRN: [id number]  SSN: [id number]    Date of Birth: 06/16/1980  Age: 40 y.o.  Sex: female      Admit Date: 02/23/2021    8 Days Post-Op    Procedure:  Procedure(s):  SMALL BOWEL RESECTION X2, LYSIS OF ADHESIONS, DIAGNOSTIC LAPAROSCOPY CONVERTED TO OPEN    Subjective:     Patient has no complaints. Passed flatus a couple of times.    Objective:     Visit Vitals  BP 136/72 (BP 1 Location: Left upper arm, BP Patient Position: At rest)   Pulse 83   Temp 98.3 ??F (36.8 ??C)   Resp 18   Ht 5' 2.99" (1.6 m)   Wt 127 kg (280 lb)   SpO2 96%   BMI 49.61 kg/m??       Temp (24hrs), Avg:98.5 ??F (36.9 ??C), Min:98.1 ??F (36.7 ??C), Max:99.1 ??F (37.3 ??C)      Physical Exam:    ABDOMEN: soft, ND, appropriately tender, inc c/d/i    Data Review: images and reports reviewed    Lab Review:   All lab results for the last 24 hours reviewed.    Assessment:     Hospital Problems  Date Reviewed: 01/06/21            Codes Class Noted POA    SBO (small bowel obstruction) ([facility]) ICD-10-CM: Q68.341  ICD-9-CM: 560.9  02/23/2021 Unknown           Plan/Recommendations/Medical Decision Making:     Will advance to clear liquids

## 2021-03-06 NOTE — Progress Notes (Signed)
Problem: Falls - Risk of  Goal: *Absence of Falls  Description: Document Alyssa [name] Fall Risk and appropriate interventions in the flowsheet.  Outcome: Progressing Towards Goal  Note: Fall Risk Interventions:  Mobility Interventions: Assess mobility with egress test, Bed/chair exit alarm, Patient to call before getting OOB, PT Consult for mobility concerns, PT Consult for assist device competence         Medication Interventions: Bed/chair exit alarm, Evaluate medications/consider consulting pharmacy, Patient to call before getting OOB    Elimination Interventions: Call light in reach, Bed/chair exit alarm, Stay With Me (per policy), Toilet paper/wipes in reach, Toileting schedule/hourly rounds    History of Falls Interventions: Bed/chair exit alarm, Room close to nurse's station, Utilize gait belt for transfer/ambulation         Problem: Patient Education: Go to Patient Education Activity  Goal: Patient/Family Education  Outcome: Progressing Towards Goal     Problem: Pain  Goal: *Control of Pain  Outcome: Progressing Towards Goal  Goal: *PALLIATIVE CARE:  Alleviation of Pain  Outcome: Progressing Towards Goal     Problem: Patient Education: Go to Patient Education Activity  Goal: Patient/Family Education  Outcome: Progressing Towards Goal     Problem: Pressure Injury - Risk of  Goal: *Prevention of pressure injury  Description: Document [name] and appropriate interventions in the flowsheet.  Outcome: Progressing Towards Goal  Note: Pressure Injury Interventions:            Activity Interventions: Increase time out of bed, Pressure redistribution bed/mattress(bed type), PT/OT evaluation    Mobility Interventions: HOB 30 degrees or less, Pressure redistribution bed/mattress (bed type), PT/OT evaluation    Nutrition Interventions: Document food/fluid/supplement intake                     Problem: Patient Education: Go to Patient Education Activity  Goal: Patient/Family Education  Outcome: Progressing Towards  Goal     Problem: Nutrition Deficit  Goal: *Optimize nutritional status  Outcome: Progressing Towards Goal

## 2021-03-07 LAB — POCT GLUCOSE
POC Glucose: 146 mg/dL — ABNORMAL HIGH (ref 65–117)
POC Glucose: 136 mg/dL — ABNORMAL HIGH (ref 65–117)
POC Glucose: 165 mg/dL — ABNORMAL HIGH (ref 65–117)
POC Glucose: 166 mg/dL — ABNORMAL HIGH (ref 65–117)

## 2021-03-07 LAB — GLUCOSE, POC
Glucose (POC): 146 mg/dL — ABNORMAL HIGH (ref 65–117)
Glucose (POC): 136 mg/dL — ABNORMAL HIGH (ref 65–117)
Glucose (POC): 165 mg/dL — ABNORMAL HIGH (ref 65–117)
Glucose (POC): 166 mg/dL — ABNORMAL HIGH (ref 65–117)

## 2021-03-07 MED ORDER — SIMETHICONE 80 MG CHEWABLE TAB
80 mg | ORAL | Status: AC
Start: 2021-03-07 — End: 2021-03-07
  Administered 2021-03-07: 02:00:00 via ORAL

## 2021-03-07 MED ORDER — SIMETHICONE 80 MG CHEWABLE TAB
80 mg | ORAL | Status: DC | PRN
Start: 2021-03-07 — End: 2021-03-09
  Administered 2021-03-07 – 2021-03-09 (×6): via ORAL

## 2021-03-07 MED FILL — HEPARIN (PORCINE) 5,000 UNIT/ML IJ SOLN: 5000 unit/mL | INTRAMUSCULAR | Qty: 1

## 2021-03-07 MED FILL — GABAPENTIN 300 MG CAP: 300 mg | ORAL | Qty: 1

## 2021-03-07 MED FILL — KETOROLAC TROMETHAMINE 30 MG/ML INJECTION: 30 mg/mL (1 mL) | INTRAMUSCULAR | Qty: 1

## 2021-03-07 MED FILL — PROTONIX 40 MG INTRAVENOUS SOLUTION: 40 mg | INTRAVENOUS | Qty: 40

## 2021-03-07 MED FILL — VALACYCLOVIR 500 MG TAB: 500 mg | ORAL | Qty: 1

## 2021-03-07 MED FILL — OXYCODONE 5 MG TAB: 5 mg | ORAL | Qty: 1

## 2021-03-07 MED FILL — SIMETHICONE 80 MG CHEWABLE TAB: 80 mg | ORAL | Qty: 1

## 2021-03-07 MED FILL — INSULIN LISPRO 100 UNIT/ML INJECTION: 100 unit/mL | SUBCUTANEOUS | Qty: 1

## 2021-03-07 MED FILL — LISINOPRIL 20 MG TAB: 20 mg | ORAL | Qty: 1

## 2021-03-07 NOTE — Progress Notes (Signed)
1259 Paged on call for VSI, pt has Hydralazine PRN wihout parameters. Pt's diastolic is 90W-409B. Please advise  1305 Per Dr. Rebeca Allegra, add parameters for hydrazine SBP >170    1530 Bedside shift change report given to Ysidro Evert, RN (oncoming nurse) by Doroteo Bradford, LPN (offgoing nurse). Report included the following information SBAR, Kardex, Procedure Summary, Intake/Output, MAR, Recent Results, Med Rec Status, Cardiac Rhythm elevated Bps, new hydralazine parameters, and Quality Measures.   Left side abdominal tenderness. D/c IVF LR.

## 2021-03-07 NOTE — Progress Notes (Signed)
Bedside shift change report given to Doroteo Bradford (Soil scientist) by Arrie Aran (offgoing nurse). Report included the following information SBAR, Kardex, Intake/Output, MAR, and Recent Results.

## 2021-03-07 NOTE — Progress Notes (Signed)
Problem: Surgical Pathway Post-Op Day 2 through Discharge  Goal: Activity/Safety  Outcome: Progressing Towards Goal  Goal: Nutrition/Diet  Outcome: Not Progressing Towards Goal  Goal: Discharge Planning  Outcome: Progressing Towards Goal  Goal: Medications  Outcome: Progressing Towards Goal  Goal: Respiratory  Outcome: Progressing Towards Goal  Goal: Treatments/Interventions/Procedures  Outcome: Progressing Towards Goal  Goal: Psychosocial  Outcome: Progressing Towards Goal  Goal: *No signs and symptoms of infection or wound complications  Outcome: Progressing Towards Goal  Goal: *Optimal pain control at patient's stated goal  Outcome: Progressing Towards Goal  Goal: *Adequate urinary output (equal to or greater than 30 milliliters/hour)  Outcome: Progressing Towards Goal  Goal: *Hemodynamically stable  Outcome: Progressing Towards Goal  Goal: *Tolerating diet  Outcome: Not Progressing Towards Goal  Goal: *Demonstrates progressive activity  Outcome: Progressing Towards Goal  Goal: *Lungs clear or at baseline  Outcome: Progressing Towards Goal

## 2021-03-07 NOTE — Progress Notes (Signed)
Surgery     Tolerating clears, +flatus, +multiple BMs    AFVS  Abd soft, ND, app tender    Advance diet to regular   She is moving around great  Hopefully home tomorrow

## 2021-03-08 ENCOUNTER — Inpatient Hospital Stay: Admit: 2021-03-08 | Payer: BLUE CROSS/BLUE SHIELD | Primary: Family Medicine

## 2021-03-08 LAB — POCT GLUCOSE
POC Glucose: 160 mg/dL — ABNORMAL HIGH (ref 65–117)
POC Glucose: 195 mg/dL — ABNORMAL HIGH (ref 65–117)
POC Glucose: 193 mg/dL — ABNORMAL HIGH (ref 65–117)
POC Glucose: 146 mg/dL — ABNORMAL HIGH (ref 65–117)

## 2021-03-08 LAB — GLUCOSE, POC
Glucose (POC): 160 mg/dL — ABNORMAL HIGH (ref 65–117)
Glucose (POC): 195 mg/dL — ABNORMAL HIGH (ref 65–117)
Glucose (POC): 193 mg/dL — ABNORMAL HIGH (ref 65–117)
Glucose (POC): 146 mg/dL — ABNORMAL HIGH (ref 65–117)

## 2021-03-08 MED ORDER — IOPAMIDOL 76 % IV SOLN
76 % | Freq: Once | INTRAVENOUS | Status: AC
Start: 2021-03-08 — End: 2021-03-08
  Administered 2021-03-08: 20:00:00 via INTRAVENOUS

## 2021-03-08 MED ORDER — DIPHENHYDRAMINE HCL 50 MG/ML IJ SOLN
50 mg/mL | Freq: Once | INTRAMUSCULAR | Status: AC
Start: 2021-03-08 — End: 2021-03-08
  Administered 2021-03-08: 20:00:00 via INTRAVENOUS

## 2021-03-08 MED FILL — PROCHLORPERAZINE EDISYLATE 5 MG/ML INJECTION: 5 mg/mL | INTRAMUSCULAR | Qty: 2

## 2021-03-08 MED FILL — OXYCODONE 5 MG TAB: 5 mg | ORAL | Qty: 1

## 2021-03-08 MED FILL — INSULIN LISPRO 100 UNIT/ML INJECTION: 100 unit/mL | SUBCUTANEOUS | Qty: 1

## 2021-03-08 MED FILL — GABAPENTIN 300 MG CAP: 300 mg | ORAL | Qty: 1

## 2021-03-08 MED FILL — DIPHENHYDRAMINE HCL 50 MG/ML IJ SOLN: 50 mg/mL | INTRAMUSCULAR | Qty: 1

## 2021-03-08 MED FILL — ONDANSETRON (PF) 4 MG/2 ML INJECTION: 4 mg/2 mL | INTRAMUSCULAR | Qty: 2

## 2021-03-08 MED FILL — LISINOPRIL 20 MG TAB: 20 mg | ORAL | Qty: 1

## 2021-03-08 MED FILL — HEPARIN (PORCINE) 5,000 UNIT/ML IJ SOLN: 5000 unit/mL | INTRAMUSCULAR | Qty: 1

## 2021-03-08 MED FILL — SIMETHICONE 80 MG CHEWABLE TAB: 80 mg | ORAL | Qty: 1

## 2021-03-08 MED FILL — VALACYCLOVIR 500 MG TAB: 500 mg | ORAL | Qty: 1

## 2021-03-08 MED FILL — KETOROLAC TROMETHAMINE 30 MG/ML INJECTION: 30 mg/mL (1 mL) | INTRAMUSCULAR | Qty: 1

## 2021-03-08 MED FILL — PROTONIX 40 MG INTRAVENOUS SOLUTION: 40 mg | INTRAVENOUS | Qty: 40

## 2021-03-08 MED FILL — ISOVUE-370  76 % INTRAVENOUS SOLUTION: 370 mg iodine /mL (76 %) | INTRAVENOUS | Qty: 100

## 2021-03-08 MED FILL — HYDRALAZINE 20 MG/ML IJ SOLN: 20 mg/mL | INTRAMUSCULAR | Qty: 1

## 2021-03-08 MED FILL — HYDROMORPHONE (PF) 1 MG/ML IJ SOLN: 1 mg/mL | INTRAMUSCULAR | Qty: 1

## 2021-03-08 NOTE — Progress Notes (Signed)
 Comprehensive Nutrition Assessment    Type and Reason for Visit: Reassess    Nutrition Recommendations/Plan:   Advance to Regular 4 carb choice recommended. BG elevated. A1c 9.4.  Monitor Gi status  Consult if alternate means of nutrition needed.      Malnutrition Assessment:  Malnutrition Status:  No malnutrition (03/02/21 1239)    Context:  Acute illness     Findings of the 6 clinical characteristics of malnutrition:   Energy Intake:  No significant decrease in energy intake  Weight Loss:  No significant weight loss     Body Fat Loss:  No significant body fat loss,     Muscle Mass Loss:  Unable to assess,    Fluid Accumulation:  No significant fluid accumulation,    Grip Strength:  Not performed     Nutrition Assessment:     Pt is a 40 yo female admitted with SBO (small bowel obstruction) (HCC) [K56.609]. 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    11/21: Pt with n/v this morning after breakfast. Pt had CT done this afternoon - awaiting results. Diet still ordered but RN notes pt unofficially NPO for CT. Pt tolerated clear liquid prior to Regular diet- recommend adding 4 carb choice, or No Concentrated sweets to diet. Monitor CT results and plan of care. BG 193, 195, 160, 166 mg/dL.     11/18: Follow up. Pt with emesis earlier today, made NPO again. POD 7. Monitor ability to tolerate PO. Monitor plan of care. No surgical intervention planned per surgery 11/17. Monitor if pt might benefit from NG tube to suction if pt continues to vomit.     Pt screened for LOS. Pt is POD4 bowel resection. Pt reported appetite is great and she is ready to eat whole foods again. Pt is tolerating clear liquid diet well - advance diet to full  liquid as able. Pt stated UBW is around 287#. Intern obtained newly measured bed scale weight without blankets and pillows - 259.4#. Pt has lost 21# (7%) in 7 months, not clinically significant for time frame. Pt voiced acceptance of ONS - providing gelatein once a day to aid in healing. Pt reported nausea yesterday after eating for the first time in a week but no nausea, vomiting or diarrhea today. Stated she had a BM yesterday. Per documentation, last BM was on 11/7 PTA. NKFA. No difficulty chewing/swallowing. Per Nutrition Focused Physical Exam no clinical signs of malnutrition.     Documented Meal intake:  Patient Vitals for the past 168 hrs:   % Diet Eaten   03/08/21 1327 0%   03/08/21 0914 1 - 25%   03/06/21 1723 51 - 75%   03/05/21 1741 0%   03/05/21 1152 0%   03/05/21 0816 0%   03/04/21 1613 76 - 100%   03/04/21 1213 76 - 100%   03/04/21 0912 51 - 75%   03/04/21 0700 0%   03/03/21 1626 1 - 25%   03/03/21 1140 1 - 25%   03/03/21 1024 0%   03/03/21 0800 0%   03/02/21 1250 51 - 75%   03/02/21 0723 76 - 100%           Nutrition Related Findings:      Wound Type: Surgical incision (Upper, mid abdomen)  Last Bowel Movement Date: 03/07/21  Stool Appearance: Soft  Abdominal Assessment: Soft  Appetite: NPO  Bowel Sounds: Hypoactive   Edema:Generalized: No Edema (03/07/2021  7:56 PM)      Nutr. Labs:  Lab Results   Component Value Date/Time    GFR est AA >60 08/15/2020 09:21 AM    GFR est non-AA >60 08/15/2020 09:21 AM    Creatinine 0.55 03/04/2021 08:37 AM    BUN 3 (L) 03/04/2021 08:37 AM    Sodium 138 03/04/2021 08:37 AM    Potassium 2.9 (L) 03/04/2021 08:37 AM    Chloride 102 03/04/2021 08:37 AM    CO2 30 03/04/2021 08:37 AM       Lab Results   Component Value Date/Time    Glucose 119 (H) 03/04/2021 08:37 AM    Glucose (POC) 193 (H) 03/08/2021 11:10 AM       Lab Results   Component Value Date/Time    Hemoglobin A1c 11.2 (H) 03/27/2020 09:03 AM    Hemoglobin A1c (POC) 9.4 01/21/2021 12:11 PM       Nutr.  Meds:  Current Facility-Administered Medications   Medication Dose Route Frequency    gabapentin  (NEURONTIN ) capsule 300 mg  300 mg Oral QHS    pantoprazole  (PROTONIX ) 40 mg in 0.9% sodium chloride  10 mL injection  40 mg IntraVENous Q12H    valACYclovir  (VALTREX ) tablet 500 mg  500 mg Oral BID    lisinopriL  (PRINIVIL , ZESTRIL ) tablet 20 mg  20 mg Oral DAILY    insulin  lispro (HUMALOG ) injection   SubCUTAneous Q6H    heparin (porcine) injection 5,000 Units  5,000 Units SubCUTAneous Q8H         Current Nutrition Intake & Therapies:  Average Meal Intake: 76-100%  Average Supplement Intake: None ordered  ADULT DIET Regular    Anthropometric Measures:  Height: 5' 2.99 (160 cm)  Ideal Body Weight (IBW): 115 lbs (52 kg)   Current Body Wt:  117.7 kg (259 lb 6.4 oz), 225.6 % IBW. Bed scale  Current BMI (kg/m2): 46        Weight Adjustment: No adjustment         BMI Category: Obese class 3 (BMI 40.0 or greater)    Wt Readings from Last 10 Encounters:   02/26/21 127 kg (280 lb)   09/17/20 123.8 kg (273 lb)   08/15/20 127.1 kg (280 lb 3.3 oz)   07/14/20 127.9 kg (282 lb)   05/26/20 130.6 kg (288 lb)   05/19/20 130.6 kg (288 lb)   04/25/20 131.1 kg (289 lb 0.4 oz)   03/27/20 129.3 kg (285 lb)   01/15/20 127 kg (280 lb)   01/02/20 127 kg (280 lb)       Estimated Daily Nutrient Needs:  Energy Requirements Based On: Formula  Weight Used for Energy Requirements: Current  Energy (kcal/day): 2535 (MSJ x 1.4)  Weight Used for Protein Requirements: Current  Protein (g/day): 94-118 (0.8-1g/kg)  Method Used for Fluid Requirements: 1 ml/kcal  Fluid (ml/day): 2535 (54ml/kcal)    Nutrition Diagnosis:   Inadequate protein-energy intake related to acute injury/trauma, altered GI structure as evidenced by NPO or clear liquid status due to medical condition, GI abnormality (POD4 bowel resection)      Nutrition Interventions:   Food and/or Nutrient Delivery: Modify current diet, Start oral nutrition supplement  Nutrition Education/Counseling: No  recommendations at this time  Coordination of Nutrition Care: Continue to monitor while inpatient  Plan of Care discussed with: patient  Goals:   Goals: Meet at least 75% of estimated needs, by next RD assessment       Nutrition Monitoring and Evaluation:   Behavioral-Environmental Outcomes: None identified  Food/Nutrient Intake Outcomes: Diet advancement/tolerance, Food and nutrient intake, Supplement intake  Physical Signs/Symptoms Outcomes: Biochemical data, GI status, Nausea/vomiting, Weight    Discharge Planning:    Too soon to determine    Darryle DELENA Sharps, RD  RD office: [phone]

## 2021-03-08 NOTE — Progress Notes (Signed)
Problem: Falls - Risk of  Goal: *Absence of Falls  Description: Document Patrcia Dolly Fall Risk and appropriate interventions in the flowsheet.  Outcome: Progressing Towards Goal  Note: Fall Risk Interventions:  Mobility Interventions: Bed/chair exit alarm         Medication Interventions: Assess postural VS orthostatic hypotension, Bed/chair exit alarm    Elimination Interventions: Call light in reach    History of Falls Interventions: Bed/chair exit alarm         Problem: Patient Education: Go to Patient Education Activity  Goal: Patient/Family Education  Outcome: Progressing Towards Goal     Problem: Pain  Goal: *Control of Pain  Outcome: Not Progressing Towards Goal  Goal: *PALLIATIVE CARE:  Alleviation of Pain  Outcome: Progressing Towards Goal

## 2021-03-08 NOTE — Progress Notes (Signed)
03/08/2021  3:25 PM  Care Management Progress Note      ICD-10-CM ICD-9-CM    1. Abdominal pain, generalized  R10.84 789.07           RUR:  6%  Risk Level: [x] Low [] Moderate [] High  Value-based purchasing: [x]  Yes []  No  Bundle patient: []  Yes [x]  No   Specify:     Transition of care plan:  Awaiting medical clearance and DC order. CT today. Awaiting diet tolerance.  Home with family assistance as needed.   Outpatient follow-up.  Pt's family to transport.

## 2021-03-08 NOTE — Progress Notes (Signed)
At 0334 blood pressure high, prn hydralazine given.  Patient no complaints of pain, no fever.     At 0435 onset of vomiting after patient repositioned to her side.  Zofran given    Vital signs taken and charted, patient noted jittery and tachycardic, no complaints of chest pain or shortness of breath.  Only abdominal pain 4/10.  Requesting oxycodone 5 mg for her pain.      At 0444 Oxycodone given.  MEWS score of 4, will inform the Doctor and RRT.     At 0508 Patient is more settled, heart rate on 104.  Mews down to 2.  Vitals taken and charted. Patient needs attended.       At 0600 patient is sleeping, kept undisturbed.      Bedside shift change report given to Riki Rusk (Cabin crew) by Alvis Lemmings (offgoing nurse). Report included the following information SBAR, Kardex, OR Summary, Procedure Summary, Intake/Output, MAR, Recent Results, and Med Rec Status.

## 2021-03-08 NOTE — Progress Notes (Signed)
Progress Note    Patient: Alyssa [name] MRN: [id number]  SSN: [id number]    Date of Birth: 08-09-1980  Age: 39 y.o.  Sex: female      Admit Date: 02/23/2021    10 Days Post-Op    Procedure:  Procedure(s):  SMALL BOWEL RESECTION X2, LYSIS OF ADHESIONS, DIAGNOSTIC LAPAROSCOPY CONVERTED TO OPEN    Subjective:     Sig nausea and vomiting today  Pain somewhat controlled though    Objective:     Visit Vitals  BP (!) 157/87 (BP 1 Location: Right lower arm, BP Patient Position: At rest)   Pulse 100   Temp 98.2 ??F (36.8 ??C)   Resp 18   Ht 5' 2.99" (1.6 m)   Wt 127 kg (280 lb)   SpO2 98%   BMI 49.61 kg/m??       Temp (24hrs), Avg:98.4 ??F (36.9 ??C), Min:98.1 ??F (36.7 ??C), Max:98.5 ??F (36.9 ??C)      Physical Exam:    ABDOMEN: soft, obese, min distended, appropriately tender, inc c/d/i    Data Review: images and reports reviewed    Lab Review:   All lab results for the last 24 hours reviewed.    Assessment:     Hospital Problems  Date Reviewed: 15-Jan-2021            Codes Class Noted POA    SBO (small bowel obstruction) ([place]) ICD-10-CM: K56.609  ICD-9-CM: 560.9  02/23/2021 Unknown         Plan/Recommendations/Medical Decision Making:     NPO  CT for abd/pelvis to eval abd fluid collection  Will need benadryl and zofran pre-med      Pt seen and discussed with Dr Elnoria Howard, NP    CT reveals interval decrease in fluid collections, reassuring.  Continue current care.

## 2021-03-08 NOTE — Progress Notes (Signed)
RRT Note    MEWS 3.     Per Primary RN, Lorre Nick, pt was tachycardic d/t pain. Pain meds were given, per mar. Primary RN does not have any other questions or concerns at this time. No interventions required at this time.     0005- received call from primary RN for assistance with reaching general surgery team about pt HR in 120s. [place] number provided. Recommended that pt be placed on remote tele for monitoring. Primary RN Lorre Nick calling team.

## 2021-03-09 LAB — POCT GLUCOSE
POC Glucose: 163 mg/dL — ABNORMAL HIGH (ref 65–117)
POC Glucose: 166 mg/dL — ABNORMAL HIGH (ref 65–117)
POC Glucose: 179 mg/dL — ABNORMAL HIGH (ref 65–117)
POC Glucose: 178 mg/dL — ABNORMAL HIGH (ref 65–117)

## 2021-03-09 LAB — GLUCOSE, POC
Glucose (POC): 163 mg/dL — ABNORMAL HIGH (ref 65–117)
Glucose (POC): 166 mg/dL — ABNORMAL HIGH (ref 65–117)
Glucose (POC): 179 mg/dL — ABNORMAL HIGH (ref 65–117)
Glucose (POC): 178 mg/dL — ABNORMAL HIGH (ref 65–117)

## 2021-03-09 MED ORDER — ONDANSETRON 4 MG TAB, RAPID DISSOLVE
4 mg | ORAL_TABLET | Freq: Three times a day (TID) | ORAL | 0 refills | Status: DC | PRN
Start: 2021-03-09 — End: 2021-03-26

## 2021-03-09 MED ORDER — GABAPENTIN 300 MG CAP
300 mg | ORAL_CAPSULE | Freq: Every evening | ORAL | 0 refills | Status: DC
Start: 2021-03-09 — End: 2021-03-09

## 2021-03-09 MED ORDER — METOPROLOL TARTRATE 25 MG TAB
25 mg | Freq: Once | ORAL | Status: AC
Start: 2021-03-09 — End: 2021-03-09
  Administered 2021-03-09: 06:00:00 via ORAL

## 2021-03-09 MED ORDER — GABAPENTIN 300 MG CAP
300 mg | ORAL_CAPSULE | Freq: Every evening | ORAL | 0 refills | Status: DC
Start: 2021-03-09 — End: 2021-03-26

## 2021-03-09 MED ORDER — OXYCODONE 5 MG TAB
5 mg | ORAL_TABLET | ORAL | 0 refills | Status: DC | PRN
Start: 2021-03-09 — End: 2021-03-09

## 2021-03-09 MED ORDER — ONDANSETRON 4 MG TAB, RAPID DISSOLVE
4 mg | ORAL_TABLET | Freq: Three times a day (TID) | ORAL | 0 refills | Status: DC | PRN
Start: 2021-03-09 — End: 2021-03-09

## 2021-03-09 MED ORDER — OXYCODONE 5 MG TAB
5 mg | ORAL_TABLET | ORAL | 0 refills | Status: AC | PRN
Start: 2021-03-09 — End: 2021-03-12

## 2021-03-09 MED FILL — HEPARIN (PORCINE) 5,000 UNIT/ML IJ SOLN: 5000 unit/mL | INTRAMUSCULAR | Qty: 1

## 2021-03-09 MED FILL — METOPROLOL TARTRATE 25 MG TAB: 25 mg | ORAL | Qty: 1

## 2021-03-09 MED FILL — GABAPENTIN 300 MG CAP: 300 mg | ORAL | Qty: 1

## 2021-03-09 MED FILL — SIMETHICONE 80 MG CHEWABLE TAB: 80 mg | ORAL | Qty: 1

## 2021-03-09 MED FILL — PROTONIX 40 MG INTRAVENOUS SOLUTION: 40 mg | INTRAVENOUS | Qty: 40

## 2021-03-09 MED FILL — ACETAMINOPHEN 325 MG TABLET: 325 mg | ORAL | Qty: 2

## 2021-03-09 MED FILL — VALACYCLOVIR 500 MG TAB: 500 mg | ORAL | Qty: 1

## 2021-03-09 MED FILL — LISINOPRIL 20 MG TAB: 20 mg | ORAL | Qty: 1

## 2021-03-09 MED FILL — INSULIN LISPRO 100 UNIT/ML INJECTION: 100 unit/mL | SUBCUTANEOUS | Qty: 1

## 2021-03-09 MED FILL — OXYCODONE 5 MG TAB: 5 mg | ORAL | Qty: 1

## 2021-03-09 NOTE — Progress Notes (Signed)
03/09/2021  2:45 PM    Care Management Progress Note      ICD-10-CM ICD-9-CM    1. Abdominal pain, generalized  R10.84 789.07 gabapentin (NEURONTIN) 300 mg capsule      oxyCODONE IR (ROXICODONE) 5 mg immediate release tablet      2. SBO (small bowel obstruction) (HCC)  K56.609 560.9 gabapentin (NEURONTIN) 300 mg capsule      oxyCODONE IR (ROXICODONE) 5 mg immediate release tablet          RUR:  8%  Risk Level: [x] Low [] Moderate [] High  Value-based purchasing: [x]  Yes []  No  Bundle patient: []  Yes [x]  No   Specify:     Transition of care plan:  Discharge order submitted. Pt has medically cleared.   Home with family assistance as needed.   Outpatient follow-up.  Pt's family to transport.    Care Management Interventions  PCP Verified by CM: Yes  Mode of Transport at Discharge: Other (see comment)  MyChart Signup: No  Discharge Durable Medical Equipment: No  Physical Therapy Consult: No  Occupational Therapy Consult: No  Speech Therapy Consult: No  Support Systems: Child(ren)  Confirm Follow Up Transport: Family  Discharge Location  Patient Expects to be Discharged to:: Home with family assistance

## 2021-03-09 NOTE — Discharge Summary (Signed)
Discharge Summary    Patient: Alyssa [name]               Sex: female          DOA: 02/23/2021 12:36 PM       Date of Birth:  01-Nov-1980      Age:  40 y.o.        LOS:  LOS: 11 days                Discharge Date:  03/09/2021    Admission Diagnoses: SBO (small bowel obstruction) ([place]) [K56.609]    Discharge Diagnoses:  Same    Procedure:  Procedure(s):  SMALL BOWEL RESECTION X2, LYSIS OF ADHESIONS, DIAGNOSTIC LAPAROSCOPY CONVERTED TO OPEN    Discharge Condition: Good    Hospital Course: Unremarkable operative procedure.  Post-operative course complicated by slow return of bowel function.  Discharge to home in stable condition.      Consults: None    Significant Diagnostic Studies: See full electronic record.     Discharge Medications:   See full electronic record.     Activity/Diet/Wound Care: See patient administered discharge instructions.    Follow-up: 2 weeks    Arnetha Courser, MD  [facility]  Office:  [phone]  Fax:  [phone]

## 2021-03-09 NOTE — Progress Notes (Signed)
Spiritual Care Assessment/Progress Note  [place]      NAME: Alyssa [name]      MRN: [id number]  AGE: 40 y.o. SEX: female  Religious Affiliation: Darrick Meigs   Language: English     03/09/2021     Total Time (in minutes): 41     Spiritual Assessment begun in [facility] 99M POST SURG ORT 1 through conversation with:         [x] Patient        []  Family    []  Friend(s)        Reason for Consult: Follow-up, routine     Spiritual beliefs: (Please include comment if needed)     [x]  Identifies with a faith tradition:    Christian      [x]  Supported by a faith community:            []  Claims no spiritual orientation:           []  Seeking spiritual identity:                []  Adheres to an individual form of spirituality:           []  Not able to assess:                           Identified resources for coping:      [x]  Prayer                               []  Music                  []  Guided Imagery     [x]  Family/friends                 []  Pet visits     []  Devotional reading                         []  Unknown     []  Other:                                               Interventions offered during this visit: (See comments for more details)    Patient Interventions: Affirmation of emotions/emotional suffering, Affirmation of faith, Catharsis/review of pertinent events in supportive environment, Coping skills reviewed/reinforced, Life review/legacy, Normalization of emotional/spiritual concerns, Prayer (assurance of), Religious beliefs/image of God discussed           Plan of Care:     []  Support spiritual and/or cultural needs    []  Support AMD and/or advance care planning process      []  Support grieving process   []  Coordinate Rites and/or Rituals    []  Coordination with community clergy   []  No spiritual needs identified at this time   []  Detailed Plan of Care below (See Comments)  []  Make referral to Music Therapy  []  Make referral to Pet Therapy     []  Make referral to Addiction services  []  Make referral to  [facility] Passages  []  Make referral to Spiritual Care Partner  []  No future visits requested        [x]  Contact Spiritual Care for further referrals     Comments:   Follow up  spiritual care assessment with Alyssa. [name] on 31M post surg unit.  Alyssa [name] is sitting up in bed with TV on and warmly welcomes Chaplain and engages with updates and life review. She shares her emotions of trying to be patient as she continues to wait for test results without knowing how long she'll be here. She expressed that she did not think she would be here this long and is growing weary and home sick. She stated how much she misses her 2 children. She is happy to report that she has seen them on an occasional visit and faceTime, but not the same. She also shared her past work experience here. Overall, she expressed patience, understanding and resilience. She remains hopeful and positive, and expressed her gratitude for the continued care and support of the Chaplains.     Chaplain Marianna Fuss, M.Div.  [facility] [phone])

## 2021-03-10 NOTE — Progress Notes (Signed)
Care Transitions Initial Call    Call within 2 business days of discharge: Yes     Patient: Alyssa [name] Patient DOB: 1980-12-07 MRN: [id number]    Last Discharge [facility] Facility       Date Complaint Diagnosis Description Type Department Provider    02/23/21 Abdominal Pain Abdominal pain, generalized ... ED to Hosp-Admission (Discharged) (ADMIT) [id number] Lerry Liner, MD; Diskin, Fra...            Was this an external facility discharge? No Discharge Facility: [facility]    Challenges to be reviewed by the provider   Additional needs identified to be addressed with provider: no  none         Method of communication with provider : none    Advance Care Planning:   Does patient have an Advance Directive: not on file; education provided.     Inpatient Readmission Risk score: Unplanned Readmit Risk Score: 7.7    Was this a readmission? no   Patient stated reason for the admission: n/a    Patients top risk factors for readmission: medical condition-diabetes, risk for infection    Interventions to address risk factors: Scheduled appointment with Specialist-surgeon on 12/9 @ 1:30 and Obtained and reviewed discharge summary and/or continuity of care documents    Care Transition Nurse (CTN) contacted the patient by telephone to perform post hospital discharge assessment. Verified name and DOB with patient as identifiers. Provided introduction to self, and explanation of the CTN role.     CTN reviewed discharge instructions, medical action plan and red flags with patient who verbalized understanding. Were discharge instructions available to patient? yes. Reviewed appropriate site of care based on symptoms and resources available to patient including: Specialist, Benefits related nurse triage line, [employer] 24/7, and When to call 911. Patient given an opportunity to ask questions and does not have any further questions or concerns at this time. The patient agrees to contact the PCP office for questions related to their  healthcare.     Medication reconciliation was performed with patient, who verbalizes understanding of administration of home medications. Advised obtaining a 90-day supply of all daily and as-needed medications.   Referral to Pharm D needed: yes     Home Health/Outpatient orders at discharge: none  Home Health company: none  Date of initial visit: none    Durable Medical Equipment ordered at discharge: None  Durable Medical Equipment Company: n/a  Durable Medical Equipment received: n/a    Discussed follow-up appointments. If no appointment was previously scheduled, appointment scheduling offered:  appt made . Is follow up appointment scheduled within 7 days of discharge? no. Surgeon requested to see her in two weeks.  [facility] follow up appointment(s): No future appointments.  Non-[facility] follow up appointment(s): n/a    Plan for follow-up call in 5-7 days based on severity of symptoms and risk factors.  Plan for next call: follow up appointment-set for 12/9 @ 1:30  CTN provided contact information for future needs.     Goals Addressed                   This Visit's Progress     Completes all follow-up appointments        Educate and encourage importance of FU for prevention of complications or disease;  Assess the patient's relationship with a PCP and next FU visit scheduled;  Discuss importance of adherence to treatment plan and follow up visits;  Identify any barriers in transportation or access to  FU appointments.   Assist patient with making FU appointments as needed;   It's also a good idea to know your test results.   Keep a list of the medicines you take.       Demonstrates no return to ED or red flags        Assess patient knowledge of how to contact the provider for questions if needed;  Educate patient on importance of monitoring for any red flags;  Review importance of reporting any changes, "red flags" to the provider and/or PCP;  Assess patient's knowledge of discharge instructions and any  restrictions;  Educate patient when to call 911;  Assess for any barriers with safety at home  Discuss use of nurse access line and location of number on the back of insurance card.       Supportive resources in place to maintain patient in the community (ie. Home Health, DME equipment, refer to, medication assistant plan, etc.)        [facility] - # [phone]  [facility] # [phone] PW: [facility] for associates  Nurse Access line 24/7 # [phone]  [facility] ([email or web address])  [place] [facility] [phone]  [facility] [phone]  [facility] - [place]   [facility] Workday  IT - [phone]  MyChart Help - 1- [phone]  Associate Services for advice and direction, by calling [phone] and pressing 1

## 2021-03-12 ENCOUNTER — Emergency Department: Admit: 2021-03-13 | Payer: BLUE CROSS/BLUE SHIELD | Primary: Family Medicine

## 2021-03-12 DIAGNOSIS — G8918 Other acute postprocedural pain: Secondary | ICD-10-CM

## 2021-03-12 NOTE — ED Notes (Signed)
Bowel obstruction surgery x 2 wks. Pt complains of pain and yellow discharge from the suture line.
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
## 2021-03-12 NOTE — ED Notes (Signed)
The patient was discharged home by Dr Wyline Mood and Brayton El RN in stable condition. The patient is alert and oriented, is in no respiratory distress and has vital signs within normal limits . The patient's diagnosis, condition and treatment were explained to patient or parent/guardian. The patient/responsible party expressed understanding. Two prescriptions sent to pharmacy on file. No work/school note given to pt. A discharge plan has been developed. A case manager was not involved in the process. Aftercare instructions were given to the patient. Pt instructed on over the counter colace and miralax. Pt instructed to call surgeon on Monday to arrange follow up appointment.

## 2021-03-13 ENCOUNTER — Inpatient Hospital Stay
Admit: 2021-03-13 | Discharge: 2021-03-13 | Disposition: A | Discharge status: Home / Self Care | Payer: BLUE CROSS/BLUE SHIELD | Attending: Student in an Organized Health Care Education/Training Program

## 2021-03-13 LAB — COMPREHENSIVE METABOLIC PANEL
ALT: 20 U/L (ref 12–78)
AST: 18 U/L (ref 15–37)
Albumin/Globulin Ratio: 0.7 — ABNORMAL LOW (ref 1.1–2.2)
Albumin: 3.4 g/dL — ABNORMAL LOW (ref 3.5–5.0)
Alkaline Phosphatase: 62 U/L (ref 45–117)
Anion Gap: 11 mmol/L (ref 5–15)
BUN: 4 MG/DL — ABNORMAL LOW (ref 6–20)
Bun/Cre Ratio: 5 — ABNORMAL LOW (ref 12–20)
CO2: 29 mmol/L (ref 21–32)
Calcium: 9.1 MG/DL (ref 8.5–10.1)
Chloride: 99 mmol/L (ref 97–108)
Creatinine: 0.81 MG/DL (ref 0.55–1.02)
ESTIMATED GLOMERULAR FILTRATION RATE: >60 mL/min/{1.73_m2} (ref >60)
Globulin: 5 g/dL — ABNORMAL HIGH (ref 2.0–4.0)
Glucose: 224 mg/dL — ABNORMAL HIGH (ref 65–100)
Potassium: 2.6 mmol/L — CL (ref 3.5–5.1)
Sodium: 139 mmol/L (ref 136–145)
Total Bilirubin: 0.5 MG/DL (ref 0.2–1.0)
Total Protein: 8.4 g/dL — ABNORMAL HIGH (ref 6.4–8.2)

## 2021-03-13 LAB — CBC WITH AUTO DIFFERENTIAL
Basophils %: 1 % (ref 0–1)
Basophils Absolute: 0.1 10*3/uL (ref 0.0–0.1)
Eosinophils %: 2 % (ref 0–7)
Eosinophils Absolute: 0.3 10*3/uL (ref 0.0–0.4)
Granulocyte Absolute Count: 0.1 10*3/uL — ABNORMAL HIGH (ref 0.00–0.04)
Hematocrit: 31.3 % — ABNORMAL LOW (ref 35.0–47.0)
Hemoglobin: 10.2 g/dL — ABNORMAL LOW (ref 11.5–16.0)
Immature Granulocytes: 1 % — ABNORMAL HIGH (ref 0–0.5)
Lymphocytes %: 22 % (ref 12–49)
Lymphocytes Absolute: 2.7 10*3/uL (ref 0.8–3.5)
MCH: 25.8 PG — ABNORMAL LOW (ref 26.0–34.0)
MCHC: 32.6 g/dL (ref 30.0–36.5)
MCV: 79.2 FL — ABNORMAL LOW (ref 80.0–99.0)
MPV: 10.7 FL (ref 8.9–12.9)
Monocytes %: 6 % (ref 5–13)
Monocytes Absolute: 0.7 10*3/uL (ref 0.0–1.0)
NRBC Absolute: 0 10*3/uL (ref 0.00–0.01)
Neutrophils %: 68 % (ref 32–75)
Neutrophils Absolute: 8.3 10*3/uL — ABNORMAL HIGH (ref 1.8–8.0)
Nucleated RBCs: 0 PER 100 WBC
Platelets: 566 10*3/uL — ABNORMAL HIGH (ref 150–400)
RBC: 3.95 M/uL (ref 3.80–5.20)
RDW: 15.6 % — ABNORMAL HIGH (ref 11.5–14.5)
WBC: 12.2 10*3/uL — ABNORMAL HIGH (ref 3.6–11.0)

## 2021-03-13 LAB — LIPASE
Lipase: 91 U/L (ref 73–393)
Lipase: 91 U/L (ref 73–393)

## 2021-03-13 LAB — CBC WITH AUTOMATED DIFF
ABS. BASOPHILS: 0.1 10*3/uL (ref 0.0–0.1)
ABS. EOSINOPHILS: 0.3 10*3/uL (ref 0.0–0.4)
ABS. IMM. GRANS.: 0.1 10*3/uL — ABNORMAL HIGH (ref 0.00–0.04)
ABS. LYMPHOCYTES: 2.7 10*3/uL (ref 0.8–3.5)
ABS. MONOCYTES: 0.7 10*3/uL (ref 0.0–1.0)
ABS. NEUTROPHILS: 8.3 10*3/uL — ABNORMAL HIGH (ref 1.8–8.0)
ABSOLUTE NRBC: 0 10*3/uL (ref 0.00–0.01)
BASOPHILS: 1 % (ref 0–1)
EOSINOPHILS: 2 % (ref 0–7)
HCT: 31.3 % — ABNORMAL LOW (ref 35.0–47.0)
HGB: 10.2 g/dL — ABNORMAL LOW (ref 11.5–16.0)
IMMATURE GRANULOCYTES: 1 % — ABNORMAL HIGH (ref 0–0.5)
LYMPHOCYTES: 22 % (ref 12–49)
MCH: 25.8 PG — ABNORMAL LOW (ref 26.0–34.0)
MCHC: 32.6 g/dL (ref 30.0–36.5)
MCV: 79.2 FL — ABNORMAL LOW (ref 80.0–99.0)
MONOCYTES: 6 % (ref 5–13)
MPV: 10.7 fL (ref 8.9–12.9)
NEUTROPHILS: 68 % (ref 32–75)
NRBC: 0 /100{WBCs}
PLATELET: 566 10*3/uL — ABNORMAL HIGH (ref 150–400)
RBC: 3.95 M/uL (ref 3.80–5.20)
RDW: 15.6 % — ABNORMAL HIGH (ref 11.5–14.5)
WBC: 12.2 10*3/uL — ABNORMAL HIGH (ref 3.6–11.0)

## 2021-03-13 LAB — METABOLIC PANEL, COMPREHENSIVE
A-G Ratio: 0.7 — ABNORMAL LOW (ref 1.1–2.2)
ALT (SGPT): 20 U/L (ref 12–78)
AST (SGOT): 18 U/L (ref 15–37)
Albumin: 3.4 g/dL — ABNORMAL LOW (ref 3.5–5.0)
Alk. phosphatase: 62 U/L (ref 45–117)
Anion gap: 11 mmol/L (ref 5–15)
BUN/Creatinine ratio: 5 — ABNORMAL LOW (ref 12–20)
BUN: 4 MG/DL — ABNORMAL LOW (ref 6–20)
Bilirubin, total: 0.5 mg/dL (ref 0.2–1.0)
CO2: 29 mmol/L (ref 21–32)
Calcium: 9.1 mg/dL (ref 8.5–10.1)
Chloride: 99 mmol/L (ref 97–108)
Creatinine: 0.81 mg/dL (ref 0.55–1.02)
Globulin: 5 g/dL — ABNORMAL HIGH (ref 2.0–4.0)
Glucose: 224 mg/dL — ABNORMAL HIGH (ref 65–100)
Potassium: 2.6 mmol/L — CL (ref 3.5–5.1)
Protein, total: 8.4 g/dL — ABNORMAL HIGH (ref 6.4–8.2)
Sodium: 139 mmol/L (ref 136–145)
eGFR: >60 mL/min/{1.73_m2} (ref >60)

## 2021-03-13 MED ORDER — OXYCODONE 5 MG TAB
5 mg | ORAL_TABLET | ORAL | 0 refills | Status: DC | PRN
Start: 2021-03-13 — End: 2021-03-26

## 2021-03-13 MED ORDER — POTASSIUM CHLORIDE SR 10 MEQ TAB
10 mEq | ORAL | Status: AC
Start: 2021-03-13 — End: 2021-03-12
  Administered 2021-03-13: 02:00:00 via ORAL

## 2021-03-13 MED ORDER — HYDROMORPHONE 1 MG/ML INJECTION SOLUTION
1 mg/mL | Freq: Once | INTRAMUSCULAR | Status: AC
Start: 2021-03-13 — End: 2021-03-12
  Administered 2021-03-13: 02:00:00 via INTRAVENOUS

## 2021-03-13 MED ORDER — ONDANSETRON (PF) 4 MG/2 ML INJECTION
4 mg/2 mL | Freq: Once | INTRAMUSCULAR | Status: AC
Start: 2021-03-13 — End: 2021-03-12
  Administered 2021-03-13: 02:00:00 via INTRAVENOUS

## 2021-03-13 MED ORDER — SODIUM CHLORIDE 0.9% BOLUS IV
0.9 % | Freq: Once | INTRAVENOUS | Status: AC
Start: 2021-03-13 — End: 2021-03-12
  Administered 2021-03-13: 02:00:00 via INTRAVENOUS

## 2021-03-13 MED ORDER — MAGNESIUM OXIDE 400 MG TAB
400 mg | ORAL | Status: AC
Start: 2021-03-13 — End: 2021-03-12
  Administered 2021-03-13: 03:00:00 via ORAL

## 2021-03-13 MED ORDER — POTASSIUM CHLORIDE SR 10 MEQ TAB, PARTICLES/CRYSTALS
10 mEq | ORAL_TABLET | Freq: Every day | ORAL | 0 refills | Status: DC
Start: 2021-03-13 — End: 2021-03-26

## 2021-03-13 MED FILL — SODIUM CHLORIDE 0.9 % IV: INTRAVENOUS | Qty: 1000

## 2021-03-13 MED FILL — HYDROMORPHONE (PF) 1 MG/ML IJ SOLN: 1 mg/mL | INTRAMUSCULAR | Qty: 1

## 2021-03-13 MED FILL — ONDANSETRON (PF) 4 MG/2 ML INJECTION: 4 mg/2 mL | INTRAMUSCULAR | Qty: 2

## 2021-03-13 MED FILL — POTASSIUM CHLORIDE SR 10 MEQ TAB: 10 mEq | ORAL | Qty: 4

## 2021-03-13 MED FILL — MAGNESIUM OXIDE 400 MG TAB: 400 mg | ORAL | Qty: 1

## 2021-03-15 ENCOUNTER — Emergency Department: Admit: 2021-03-15 | Payer: BLUE CROSS/BLUE SHIELD | Primary: Family Medicine

## 2021-03-15 ENCOUNTER — Inpatient Hospital Stay
Admit: 2021-03-15 | Discharge: 2021-03-16 | Disposition: A | Discharge status: Home / Self Care | Payer: BLUE CROSS/BLUE SHIELD | Attending: Emergency Medicine

## 2021-03-15 DIAGNOSIS — S301XXA Contusion of abdominal wall, initial encounter: Secondary | ICD-10-CM

## 2021-03-15 LAB — COMPREHENSIVE METABOLIC PANEL
ALT: 20 U/L (ref 12–78)
AST: 19 U/L (ref 15–37)
Albumin/Globulin Ratio: 0.7 — ABNORMAL LOW (ref 1.1–2.2)
Albumin: 3.5 g/dL (ref 3.5–5.0)
Alkaline Phosphatase: 60 U/L (ref 45–117)
Anion Gap: 8 mmol/L (ref 5–15)
BUN: 7 MG/DL (ref 6–20)
Bun/Cre Ratio: 8 — ABNORMAL LOW (ref 12–20)
CO2: 28 mmol/L (ref 21–32)
Calcium: 9.9 MG/DL (ref 8.5–10.1)
Chloride: 99 mmol/L (ref 97–108)
Creatinine: 0.83 MG/DL (ref 0.55–1.02)
ESTIMATED GLOMERULAR FILTRATION RATE: >60 mL/min/{1.73_m2} (ref >60)
Globulin: 5.2 g/dL — ABNORMAL HIGH (ref 2.0–4.0)
Glucose: 169 mg/dL — ABNORMAL HIGH (ref 65–100)
Potassium: 3.4 mmol/L — ABNORMAL LOW (ref 3.5–5.1)
Sodium: 135 mmol/L — ABNORMAL LOW (ref 136–145)
Total Bilirubin: 0.5 MG/DL (ref 0.2–1.0)
Total Protein: 8.7 g/dL — ABNORMAL HIGH (ref 6.4–8.2)

## 2021-03-15 LAB — CBC WITH AUTO DIFFERENTIAL
Basophils %: 1 % (ref 0–1)
Basophils Absolute: 0.1 10*3/uL (ref 0.0–0.1)
Eosinophils %: 2 % (ref 0–7)
Eosinophils Absolute: 0.2 10*3/uL (ref 0.0–0.4)
Granulocyte Absolute Count: 0.1 10*3/uL — ABNORMAL HIGH (ref 0.00–0.04)
Hematocrit: 34.1 % — ABNORMAL LOW (ref 35.0–47.0)
Hemoglobin: 10.9 g/dL — ABNORMAL LOW (ref 11.5–16.0)
Immature Granulocytes: 1 % — ABNORMAL HIGH (ref 0.0–0.5)
Lymphocytes %: 33 % (ref 12–49)
Lymphocytes Absolute: 2.6 10*3/uL (ref 0.8–3.5)
MCH: 25.6 PG — ABNORMAL LOW (ref 26.0–34.0)
MCHC: 32 g/dL (ref 30.0–36.5)
MCV: 80 FL (ref 80.0–99.0)
MPV: 10.5 FL (ref 8.9–12.9)
Monocytes %: 8 % (ref 5–13)
Monocytes Absolute: 0.6 10*3/uL (ref 0.0–1.0)
NRBC Absolute: 0 10*3/uL (ref 0.00–0.01)
Neutrophils %: 55 % (ref 32–75)
Neutrophils Absolute: 4.2 10*3/uL (ref 1.8–8.0)
Nucleated RBCs: 0 PER 100 WBC
Platelets: 642 10*3/uL — ABNORMAL HIGH (ref 150–400)
RBC: 4.26 M/uL (ref 3.80–5.20)
RDW: 15.4 % — ABNORMAL HIGH (ref 11.5–14.5)
WBC: 7.8 10*3/uL (ref 3.6–11.0)

## 2021-03-15 LAB — CBC WITH AUTOMATED DIFF
ABS. BASOPHILS: 0.1 10*3/uL (ref 0.0–0.1)
ABS. EOSINOPHILS: 0.2 10*3/uL (ref 0.0–0.4)
ABS. IMM. GRANS.: 0.1 10*3/uL — ABNORMAL HIGH (ref 0.00–0.04)
ABS. LYMPHOCYTES: 2.6 10*3/uL (ref 0.8–3.5)
ABS. MONOCYTES: 0.6 10*3/uL (ref 0.0–1.0)
ABS. NEUTROPHILS: 4.2 10*3/uL (ref 1.8–8.0)
ABSOLUTE NRBC: 0 10*3/uL (ref 0.00–0.01)
BASOPHILS: 1 % (ref 0–1)
EOSINOPHILS: 2 % (ref 0–7)
HCT: 34.1 % — ABNORMAL LOW (ref 35.0–47.0)
HGB: 10.9 g/dL — ABNORMAL LOW (ref 11.5–16.0)
IMMATURE GRANULOCYTES: 1 % — ABNORMAL HIGH (ref 0.0–0.5)
LYMPHOCYTES: 33 % (ref 12–49)
MCH: 25.6 PG — ABNORMAL LOW (ref 26.0–34.0)
MCHC: 32 g/dL (ref 30.0–36.5)
MCV: 80 FL (ref 80.0–99.0)
MONOCYTES: 8 % (ref 5–13)
MPV: 10.5 FL (ref 8.9–12.9)
NEUTROPHILS: 55 % (ref 32–75)
NRBC: 0 PER 100 WBC
PLATELET: 642 10*3/uL — ABNORMAL HIGH (ref 150–400)
RBC: 4.26 M/uL (ref 3.80–5.20)
RDW: 15.4 % — ABNORMAL HIGH (ref 11.5–14.5)
WBC: 7.8 10*3/uL (ref 3.6–11.0)

## 2021-03-15 LAB — METABOLIC PANEL, COMPREHENSIVE
A-G Ratio: 0.7 — ABNORMAL LOW (ref 1.1–2.2)
ALT (SGPT): 20 U/L (ref 12–78)
AST (SGOT): 19 U/L (ref 15–37)
Albumin: 3.5 g/dL (ref 3.5–5.0)
Alk. phosphatase: 60 U/L (ref 45–117)
Anion gap: 8 mmol/L (ref 5–15)
BUN/Creatinine ratio: 8 — ABNORMAL LOW (ref 12–20)
BUN: 7 MG/DL (ref 6–20)
Bilirubin, total: 0.5 MG/DL (ref 0.2–1.0)
CO2: 28 mmol/L (ref 21–32)
Calcium: 9.9 MG/DL (ref 8.5–10.1)
Chloride: 99 mmol/L (ref 97–108)
Creatinine: 0.83 MG/DL (ref 0.55–1.02)
Globulin: 5.2 g/dL — ABNORMAL HIGH (ref 2.0–4.0)
Glucose: 169 mg/dL — ABNORMAL HIGH (ref 65–100)
Potassium: 3.4 mmol/L — ABNORMAL LOW (ref 3.5–5.1)
Protein, total: 8.7 g/dL — ABNORMAL HIGH (ref 6.4–8.2)
Sodium: 135 mmol/L — ABNORMAL LOW (ref 136–145)
eGFR: >60 mL/min/{1.73_m2} (ref >60)

## 2021-03-15 LAB — SAMPLES BEING HELD

## 2021-03-15 MED ORDER — ONDANSETRON (PF) 4 MG/2 ML INJECTION
42 mg/2 mL | INTRAMUSCULAR | Status: AC
Start: 2021-03-15 — End: 2021-03-16
  Administered 2021-03-16: via INTRAVENOUS

## 2021-03-15 MED ORDER — DIPHENHYDRAMINE HCL 50 MG/ML IJ SOLN
50 mg/mL | INTRAMUSCULAR | Status: DC
Start: 2021-03-15 — End: 2021-03-16
  Administered 2021-03-16: via INTRAVENOUS

## 2021-03-15 MED ORDER — IOPAMIDOL 76 % IV SOLN
76 % | Freq: Once | INTRAVENOUS | Status: AC
Start: 2021-03-15 — End: 2021-03-15
  Administered 2021-03-16: via INTRAVENOUS

## 2021-03-15 MED ORDER — SODIUM CHLORIDE 0.9% BOLUS IV
0.9 % | Freq: Once | INTRAVENOUS | Status: AC
Start: 2021-03-15 — End: 2021-03-16
  Administered 2021-03-16: 04:00:00 via INTRAVENOUS

## 2021-03-15 MED ORDER — DICYCLOMINE 10 MG/ML IM SOLN
10 mg/mL | INTRAMUSCULAR | Status: AC
Start: 2021-03-15 — End: 2021-03-15
  Administered 2021-03-16: 05:00:00 via INTRAMUSCULAR

## 2021-03-15 MED ORDER — HYDROMORPHONE 1 MG/ML INJECTION SOLUTION
1 mg/mL | Freq: Once | INTRAMUSCULAR | Status: AC
Start: 2021-03-15 — End: 2021-03-16
  Administered 2021-03-16: 05:00:00 via INTRAVENOUS

## 2021-03-15 MED ORDER — KETOROLAC TROMETHAMINE 30 MG/ML INJECTION
301 mg/mL (1 mL) | INTRAMUSCULAR | Status: DC
Start: 2021-03-15 — End: 2021-03-16
  Administered 2021-03-16: via INTRAVENOUS

## 2021-03-15 MED FILL — SODIUM CHLORIDE 0.9 % IV: INTRAVENOUS | Qty: 1000

## 2021-03-15 MED FILL — ISOVUE-370  76 % INTRAVENOUS SOLUTION: 370 mg iodine /mL (76 %) | INTRAVENOUS | Qty: 100

## 2021-03-15 NOTE — Progress Notes (Signed)
 Patient called to let me know that she had additional drainage from her incision and had vomited.  She called her surgeon's office, as instructed.  She has been advised to go to the ED.  Says that her surgeon is out of the office for two weeks and most of the doctors at the practice are off for the same time period.  She was in tears, worried that she may have to be admitted again.  She said, I have been away from my kids for two weeks already.  Provided support, discussed plans for care of her children, stated her mom would be able to care for them, encouraged her to pack some things she may need/want if she is admitted.  Her mom is on the way to pick her up.  Offered prayers and let her know she can reach out to me should she need any assistance.  She was appreciative.

## 2021-03-15 NOTE — Progress Notes (Signed)
 Follow up phone call to patient, two pt identifiers verified. Discussed patient's goals:    Goals        Completes all follow-up appointments      Educate and encourage importance of FU for prevention of complications or disease;  Assess the patient's relationship with a PCP and next FU visit scheduled;  Discuss importance of adherence to treatment plan and follow up visits;  Identify any barriers in transportation or access to FU appointments.   Assist patient with making FU appointments as needed;   It's also a good idea to know your test results.   Keep a list of the medicines you take.  11/28: Dr. Vicci 12/9 @ 1:30.  Patient declined my offer to call to make earlier appointment for her.       Demonstrates no return to ED or red flags      Assess patient knowledge of how to contact the provider for questions if needed;  Educate patient on importance of monitoring for any red flags;  Review importance of reporting any changes, "red flags" to the provider and/or PCP;  Assess patient's knowledge of discharge instructions and any restrictions;  Educate patient when to call 911;  Assess for any barriers with safety at home  Discuss use of nurse access line and location of number on the back of insurance card.  11/28: ED visit on 11/25.  Patient had yellow drainage and a staple popped out.  Was told it was not infected.  K was 2.6, was provided with potassium chloride and changes made to her roxicodone .  Patient states she is hurting, slept for 7 hours and didn't take any pain medication.  Woke up in a lot of pain.  She took pill about 30 minutes ago.  Pain is getting better now.  Asked her if she wanted me to call her surgeon, she declined.  States she had a BM on 11/27 and early this morning (11/28).  Taking miralax .    Drainage yellow in color, crusted over, she rubbed it and it came off. Looked at her wound and wound is dry.       Supportive resources in place to maintain patient in the community (ie. Home  Health, DME equipment, refer to, medication assistant plan, etc.)      [facility] - # [phone]  [facility] # [phone] PW: [facility] for associates  Nurse Access line 24/7 # [phone]  [facility] ([email or web address])  [facility] [phone]  [facility] [phone]  [facility] Service Now - [place]   [facility] Work[employer]  IT - [phone]  MyChart Help - 1- [phone]  Associate Services for advice and direction, by calling [phone] and pressing 1              Patient's primary care provider relationship reviewed with patient and modified, as applicable.    Readiness to Change: []   Pre-contemplative    []   Contemplative  []   Preparation               [x]   Action                  []   Maintenance    Barriers/Challenges to Care: []   Decline in memory    []   Language barrier     []   Emotional                  []   Limited mobility  []   Lack of motivation     []   Vision, hearing or cognitive impairment []   Knowledge []  Financial Barriers []   Lack of support  []   Pain []   Other [x]   None    Key pt activities to achieve better health:   [x]   Weight loss  [x]   Improved diabetic control  []   Decreased cholesterol levels  []   Decreased blood pressure  []     []     Upcoming appointments: 03/26/21 - follow up with surgeon.  Plan for next call:  Call on 11/29.

## 2021-03-15 NOTE — ED Notes (Signed)
Pt reports having bowel resection on 02/26/21. Reports abd pain with drainage from surgical wound.     Pt reports nausea and vomiting as well.

## 2021-03-16 MED ORDER — BACITRACIN 500 UNIT/G TOPICAL PACKET
500 unit/gram | CUTANEOUS | Status: AC
Start: 2021-03-16 — End: 2021-03-15
  Administered 2021-03-16: 04:00:00 via TOPICAL

## 2021-03-16 MED FILL — HYDROMORPHONE (PF) 1 MG/ML IJ SOLN: 1 mg/mL | INTRAMUSCULAR | Qty: 1

## 2021-03-16 MED FILL — DICYCLOMINE 10 MG/ML IM SOLN: 10 mg/mL | INTRAMUSCULAR | Qty: 2

## 2021-03-16 MED FILL — BACITRACIN 500 UNIT/G TOPICAL PACKET: 500 unit/gram | CUTANEOUS | Qty: 1

## 2021-03-16 MED FILL — ONDANSETRON (PF) 4 MG/2 ML INJECTION: 4 mg/2 mL | INTRAMUSCULAR | Qty: 2

## 2021-03-16 MED FILL — DIPHENHYDRAMINE HCL 50 MG/ML IJ SOLN: 50 mg/mL | INTRAMUSCULAR | Qty: 1

## 2021-03-16 MED FILL — KETOROLAC TROMETHAMINE 30 MG/ML INJECTION: 30 mg/mL (1 mL) | INTRAMUSCULAR | Qty: 1

## 2021-03-16 NOTE — ED Notes (Signed)
I cleaned up the  staples in her abd. And put bacitracin on  the  wound.  Covered with  telfa like pads and  then used Med Fix dressing tap to cover and hold in  place.  I also covered the  smaller wounds with  bacitracin and  band aids.  Pt verbalized understanding of  instructions for d/c  She wanted Bentyl to take home or a script to  get more but it was a one time dose.  If it helps, I suggested that she  talk to her surgeon office and  let them know that it helped and see if they are willing to  write for it.   Pt left in  w/c.

## 2021-03-16 NOTE — Progress Notes (Signed)
 Follow up phone call to patient, two pt identifiers verified. Discussed patient's goals:    Goals        Completes all follow-up appointments      Educate and encourage importance of FU for prevention of complications or disease;  Assess the patient's relationship with a PCP and next FU visit scheduled;  Discuss importance of adherence to treatment plan and follow up visits;  Identify any barriers in transportation or access to FU appointments.   Assist patient with making FU appointments as needed;   It's also a good idea to know your test results.   Keep a list of the medicines you take.  11/28: Dr. Vicci 12/9 @ 1:30.  Patient declined my offer to call to make earlier appointment for her.  11/29: Still has appt on 12/9.  Discussed red flags.  She will call her surgeon's office if she develops a fever, increase in pain w/ meds, increased drainage/foul odor - any signs of infection.  Discussed NAL, patient states she has met her deductible so does not feel the need to call them, also has medicaid as secondary insurance. Let her know they are a good resource.  She is maintaining good fluid balance, appetite is back and having BMs regularly.       Demonstrates no return to ED or red flags      Assess patient knowledge of how to contact the provider for questions if needed;  Educate patient on importance of monitoring for any red flags;  Review importance of reporting any changes, "red flags" to the provider and/or PCP;  Assess patient's knowledge of discharge instructions and any restrictions;  Educate patient when to call 911;  Assess for any barriers with safety at home  Discuss use of nurse access line and location of number on the back of insurance card.  11/28: ED visit on 11/25.  Patient had yellow drainage and a staple popped out.  Was told it was not infected.  K was 2.6, was provided with potassium chloride and changes made to her roxicodone .  Patient states she is hurting, slept for 7 hours and didn't take  any pain medication.  Woke up in a lot of pain.  She took pill about 30 minutes ago.  Pain is getting better now.  Asked her if she wanted me to call her surgeon, she declined.  States she had a BM on 11/27 and early this morning (11/28).  Taking miralax .    Drainage yellow in color, crusted over, she rubbed it and it came off. Looked at her wound and wound is dry.  11/29: Taking miralax , going regularly.  Emotionally feels better.  Incision is covered with a dressing.  Pain is controlled with medicine.         Supportive resources in place to maintain patient in the community (ie. Home Health, DME equipment, refer to, medication assistant plan, etc.)      [facility] - # [phone]  [facility] # [phone] PW: [facility] for associates  Nurse Access line 24/7 # [phone]  [facility] ([email or web address])  [facility] [phone]  [facility] [phone]  [facility] Service Now - [place]   [facility] Work[facility]  IT - [phone]  MyChart Help - 1- [phone]  Associate Services for advice and direction, by calling [phone] and pressing 1              Patient's primary care provider relationship reviewed with patient and modified, as applicable.  Readiness to Change: []   Pre-contemplative    []   Contemplative  []   Preparation               [x]   Action                  []   Maintenance    Barriers/Challenges to Care: []   Decline in memory    []   Language barrier     []   Emotional                  []   Limited mobility  []   Lack of motivation     []  Vision, hearing or cognitive impairment []   Knowledge []  Financial Barriers []   Lack of support  []   Pain []   Other [x]   None    Key pt activities to achieve better health:   [x]   Weight loss  [x]   Improved diabetic control  []   Decreased cholesterol levels  []   Decreased blood pressure  []     []     Upcoming appointments: 03/26/21 - Dr. Vicci, surgeon.  Plan for next call:  Call on 12/2.
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
## 2021-03-19 ENCOUNTER — Inpatient Hospital Stay
Admit: 2021-03-19 | Discharge: 2021-03-26 | Disposition: A | Discharge status: Home / Self Care | Payer: BLUE CROSS/BLUE SHIELD | Attending: Surgical Oncology | Admitting: Surgical Oncology

## 2021-03-19 ENCOUNTER — Emergency Department: Admit: 2021-03-19 | Payer: BLUE CROSS/BLUE SHIELD | Primary: Family Medicine

## 2021-03-19 DIAGNOSIS — K56609 Unspecified intestinal obstruction, unspecified as to partial versus complete obstruction: Secondary | ICD-10-CM

## 2021-03-19 DIAGNOSIS — K913 Postprocedural intestinal obstruction, unspecified as to partial versus complete: Principal | ICD-10-CM

## 2021-03-19 LAB — CBC WITH AUTO DIFFERENTIAL
Basophils %: 1 % (ref 0–1)
Basophils Absolute: 0.1 10*3/uL (ref 0.0–0.1)
Eosinophils %: 1 % (ref 0–7)
Eosinophils Absolute: 0.1 10*3/uL (ref 0.0–0.4)
Granulocyte Absolute Count: 0.1 10*3/uL — ABNORMAL HIGH (ref 0.00–0.04)
Hematocrit: 34.9 % — ABNORMAL LOW (ref 35.0–47.0)
Hemoglobin: 11.2 g/dL — ABNORMAL LOW (ref 11.5–16.0)
Immature Granulocytes: 1 % — ABNORMAL HIGH (ref 0–0.5)
Lymphocytes %: 24 % (ref 12–49)
Lymphocytes Absolute: 1.9 10*3/uL (ref 0.8–3.5)
MCH: 25.7 PG — ABNORMAL LOW (ref 26.0–34.0)
MCHC: 32.1 g/dL (ref 30.0–36.5)
MCV: 80 FL (ref 80.0–99.0)
MPV: 11.1 FL (ref 8.9–12.9)
Monocytes %: 5 % (ref 5–13)
Monocytes Absolute: 0.4 10*3/uL (ref 0.0–1.0)
NRBC Absolute: 0 10*3/uL (ref 0.00–0.01)
Neutrophils %: 70 % (ref 32–75)
Neutrophils Absolute: 5.7 10*3/uL (ref 1.8–8.0)
Nucleated RBCs: 0 PER 100 WBC
Platelets: 543 10*3/uL — ABNORMAL HIGH (ref 150–400)
RBC: 4.36 M/uL (ref 3.80–5.20)
RDW: 15 % — ABNORMAL HIGH (ref 11.5–14.5)
WBC: 8.2 10*3/uL (ref 3.6–11.0)

## 2021-03-19 LAB — COMPREHENSIVE METABOLIC PANEL
ALT: 15 U/L (ref 12–78)
AST: 16 U/L (ref 15–37)
Albumin/Globulin Ratio: 0.8 — ABNORMAL LOW (ref 1.1–2.2)
Albumin: 3.7 g/dL (ref 3.5–5.0)
Alkaline Phosphatase: 57 U/L (ref 45–117)
Anion Gap: 11 mmol/L (ref 5–15)
BUN: 4 MG/DL — ABNORMAL LOW (ref 6–20)
Bun/Cre Ratio: 6 — ABNORMAL LOW (ref 12–20)
CO2: 27 mmol/L (ref 21–32)
Calcium: 8.7 MG/DL (ref 8.5–10.1)
Chloride: 99 mmol/L (ref 97–108)
Creatinine: 0.69 MG/DL (ref 0.55–1.02)
ESTIMATED GLOMERULAR FILTRATION RATE: >60 mL/min/{1.73_m2} (ref >60)
Globulin: 4.9 g/dL — ABNORMAL HIGH (ref 2.0–4.0)
Glucose: 201 mg/dL — ABNORMAL HIGH (ref 65–100)
Potassium: 3.6 mmol/L (ref 3.5–5.1)
Sodium: 137 mmol/L (ref 136–145)
Total Bilirubin: 0.5 MG/DL (ref 0.2–1.0)
Total Protein: 8.6 g/dL — ABNORMAL HIGH (ref 6.4–8.2)

## 2021-03-19 LAB — LIPASE
Lipase: 94 U/L (ref 73–393)
Lipase: 94 U/L (ref 73–393)

## 2021-03-19 LAB — METABOLIC PANEL, COMPREHENSIVE
A-G Ratio: 0.8 — ABNORMAL LOW (ref 1.1–2.2)
ALT (SGPT): 15 U/L (ref 12–78)
AST (SGOT): 16 U/L (ref 15–37)
Albumin: 3.7 g/dL (ref 3.5–5.0)
Alk. phosphatase: 57 U/L (ref 45–117)
Anion gap: 11 mmol/L (ref 5–15)
BUN/Creatinine ratio: 6 — ABNORMAL LOW (ref 12–20)
BUN: 4 MG/DL — ABNORMAL LOW (ref 6–20)
Bilirubin, total: 0.5 MG/DL (ref 0.2–1.0)
CO2: 27 mmol/L (ref 21–32)
Calcium: 8.7 MG/DL (ref 8.5–10.1)
Chloride: 99 mmol/L (ref 97–108)
Creatinine: 0.69 MG/DL (ref 0.55–1.02)
Globulin: 4.9 g/dL — ABNORMAL HIGH (ref 2.0–4.0)
Glucose: 201 mg/dL — ABNORMAL HIGH (ref 65–100)
Potassium: 3.6 mmol/L (ref 3.5–5.1)
Protein, total: 8.6 g/dL — ABNORMAL HIGH (ref 6.4–8.2)
Sodium: 137 mmol/L (ref 136–145)
eGFR: >60 mL/min/{1.73_m2} (ref >60)

## 2021-03-19 LAB — CBC WITH AUTOMATED DIFF
ABS. BASOPHILS: 0.1 10*3/uL (ref 0.0–0.1)
ABS. EOSINOPHILS: 0.1 10*3/uL (ref 0.0–0.4)
ABS. IMM. GRANS.: 0.1 10*3/uL — ABNORMAL HIGH (ref 0.00–0.04)
ABS. LYMPHOCYTES: 1.9 10*3/uL (ref 0.8–3.5)
ABS. MONOCYTES: 0.4 10*3/uL (ref 0.0–1.0)
ABS. NEUTROPHILS: 5.7 10*3/uL (ref 1.8–8.0)
ABSOLUTE NRBC: 0 10*3/uL (ref 0.00–0.01)
BASOPHILS: 1 % (ref 0–1)
EOSINOPHILS: 1 % (ref 0–7)
HCT: 34.9 % — ABNORMAL LOW (ref 35.0–47.0)
HGB: 11.2 g/dL — ABNORMAL LOW (ref 11.5–16.0)
IMMATURE GRANULOCYTES: 1 % — ABNORMAL HIGH (ref 0–0.5)
LYMPHOCYTES: 24 % (ref 12–49)
MCH: 25.7 PG — ABNORMAL LOW (ref 26.0–34.0)
MCHC: 32.1 g/dL (ref 30.0–36.5)
MCV: 80 FL (ref 80.0–99.0)
MONOCYTES: 5 % (ref 5–13)
MPV: 11.1 FL (ref 8.9–12.9)
NEUTROPHILS: 70 % (ref 32–75)
NRBC: 0 PER 100 WBC
PLATELET: 543 10*3/uL — ABNORMAL HIGH (ref 150–400)
RBC: 4.36 M/uL (ref 3.80–5.20)
RDW: 15 % — ABNORMAL HIGH (ref 11.5–14.5)
WBC: 8.2 10*3/uL (ref 3.6–11.0)

## 2021-03-19 MED ORDER — HYDROMORPHONE 1 MG/ML INJECTION SOLUTION
1 mg/mL | INTRAMUSCULAR | Status: AC
Start: 2021-03-19 — End: 2021-03-19
  Administered 2021-03-19: 23:00:00 via INTRAVENOUS

## 2021-03-19 MED ORDER — ONDANSETRON (PF) 4 MG/2 ML INJECTION
4 mg/2 mL | INTRAMUSCULAR | Status: AC
Start: 2021-03-19 — End: 2021-03-19
  Administered 2021-03-19: via INTRAVENOUS

## 2021-03-19 MED ORDER — HYDROMORPHONE 0.5 MG/0.5 ML SYRINGE
0.5 mg/ mL | INTRAMUSCULAR | Status: AC
Start: 2021-03-19 — End: 2021-03-19
  Administered 2021-03-19: 22:00:00 via INTRAVENOUS

## 2021-03-19 MED ORDER — HYDROMORPHONE 1 MG/ML INJECTION SOLUTION
1 mg/mL | Freq: Once | INTRAMUSCULAR | Status: AC
Start: 2021-03-19 — End: 2021-03-19
  Administered 2021-03-19: via INTRAVENOUS

## 2021-03-19 MED ORDER — ONDANSETRON (PF) 4 MG/2 ML INJECTION
4 mg/2 mL | INTRAMUSCULAR | Status: AC
Start: 2021-03-19 — End: 2021-03-19
  Administered 2021-03-19: 20:00:00 via INTRAVENOUS

## 2021-03-19 MED ORDER — HYDROMORPHONE 1 MG/ML INJECTION SOLUTION
1 mg/mL | Freq: Once | INTRAMUSCULAR | Status: AC
Start: 2021-03-19 — End: 2021-03-19
  Administered 2021-03-19: 20:00:00 via INTRAVENOUS

## 2021-03-19 MED ORDER — SODIUM CHLORIDE 0.9% BOLUS IV
0.9 % | Freq: Once | INTRAVENOUS | Status: AC
Start: 2021-03-19 — End: 2021-03-19
  Administered 2021-03-19: 20:00:00 via INTRAVENOUS

## 2021-03-19 MED FILL — HYDROMORPHONE 0.5 MG/0.5 ML SYRINGE: 0.5 mg/ mL | INTRAMUSCULAR | Qty: 0.5

## 2021-03-19 MED FILL — ONDANSETRON (PF) 4 MG/2 ML INJECTION: 4 mg/2 mL | INTRAMUSCULAR | Qty: 2

## 2021-03-19 MED FILL — HYDROMORPHONE (PF) 1 MG/ML IJ SOLN: 1 mg/mL | INTRAMUSCULAR | Qty: 1

## 2021-03-19 MED FILL — SODIUM CHLORIDE 0.9 % IV: INTRAVENOUS | Qty: 1000

## 2021-03-19 NOTE — ED Notes (Signed)
Michel Harrow, MD removed all staples from midline incision site. Tolerated fairly well. Edges remain approximated. OTA.

## 2021-03-19 NOTE — ED Notes (Signed)
Transported to 5th floor via stretcher accompanied by Caryl Pina, PCT and mother. Remains A&OX4. Respirations even and unlabored on room air. No c/os pain at this time. Medicated with PRN pain med per MD order. Teresita Tiron, RN to resume care.
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
## 2021-03-19 NOTE — ED Notes (Signed)
Pt presents with n/v.  Post Op SBO on 02/23/2021 with Dr Wynetta Emery.  Virtual visit with Surgery NP yesterday and given Augmentin for surgical site infection. Took Zofran ODT today with no relief. Normal BM yesterday.  Took Oxycodone today for pain but vomited after.
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
## 2021-03-19 NOTE — ED Notes (Signed)
 TRANSFER - OUT REPORT:    Telephone Verbal report given to CHRISTELLA Sanes, RN(name) on Alyssa [name]  being transferred to [facility] ER for Surgery Admit Hold (unit) for routine progression of care       Report consisted of patient's Situation, Background, Assessment and   Recommendations(SBAR).     Information from the following report(s) SBAR, ED Summary, MAR, and Recent Results was reviewed with the receiving nurse.    Lines:   Peripheral IV 03/19/21 Right Antecubital (Active)        Opportunity for questions and clarification was provided.      Patient transported with:   [facility] Transport

## 2021-03-19 NOTE — Progress Notes (Signed)
Follow up phone call to patient, two pt identifiers verified. Discussed patient's goals:    Goals        Completes all follow-up appointments      Educate and encourage importance of FU for prevention of complications or disease;  Assess the patient's relationship with a PCP and next FU visit scheduled;  Discuss importance of adherence to treatment plan and follow up visits;  Identify any barriers in transportation or access to FU appointments.   Assist patient with making FU appointments as needed;   It's also a good idea to know your test results.   Keep a list of the medicines you take.  11/28: Dr. Laural Benes 12/9 @ 1:30.  Patient declined my offer to call to make earlier appointment for her.  11/29: Still has appt on 12/9.  Discussed red flags.  She will call her surgeon's office if she develops a fever, increase in pain w/ meds, increased drainage/foul odor - any signs of infection.  Discussed NAL, patient states she has met her deductible so does not feel the need to call them, also has medicaid as secondary insurance. Let her know they are a good resource.  She is maintaining good fluid balance, appetite is back and having BMs regularly.  12/2: Follow up for post op on 12/9.  She will call if she feels that she is developing signs of infection.  States she is changing dressing 2 x day, and her daughter is helping her. Dressing has some brown drainage, nothing alarming.  If it begins to increase or smell bad, will call surgeon or [facility].       Demonstrates no return to ED or red flags      Assess patient knowledge of how to contact the provider for questions if needed;  Educate patient on importance of monitoring for any red flags;  Review importance of reporting any changes, "red flags" to the provider and/or PCP;  Assess patient's knowledge of discharge instructions and any restrictions;  Educate patient when to call 911;  Assess for any barriers with safety at home  Discuss use of nurse access line  and location of number on the back of insurance card.  11/28: ED visit on 11/25.  Patient had yellow drainage and a staple popped out.  Was told it was not infected.  K was 2.6, was provided with potassium chloride and changes made to her roxicodone.  Patient states she is hurting, slept for 7 hours and didn't take any pain medication.  Woke up in a lot of pain.  She took pill about 30 minutes ago.  Pain is getting better now.  Asked her if she wanted me to call her surgeon, she declined.  States she had a BM on 11/27 and early this morning (11/28).  Taking miralax.    Drainage yellow in color, crusted over, she rubbed it and it came off. Looked at her wound and wound is dry.  11/29: Taking miralax, going regularly.  Emotionally feels better.  Incision is covered with a dressing.  Pain is controlled with medicine.    12/2: Patient states she is tired, was up with her son overnight, as he was having issues with heartburn, went to bed around 4 am.  She is having regular BMs, feels antibiotic is messing up her stomach, has 8 more days, advised her to take probiotics, states she likes yogurt.  Discussed risk for DVT, pneumonia.  Advised her to do ankle pumps and deep breathing exercises.  Provided phone number to [facility], she wrote the number down.         Supportive resources in place to maintain patient in the community (ie. Home Health, DME equipment, refer to, medication assistant plan, etc.)      [facility] - # [phone]  [employer] # [phone] PW: [facility] for associates  Nurse Access line 24/7 # [phone]  [facility] ([email or web address])  [place] [facility] [phone]  [facility] [phone]  [employer] - [place]   [employer] Workday  IT - [phone]  MyChart Help - 1- [phone]  Associate Services for advice and direction, by calling [phone] and pressing 1              Patient's primary care provider relationship reviewed with patient and  modified, as applicable.    Readiness to Change: []   Pre-contemplative    []   Contemplative  []   Preparation               [x]   Action                  []   Maintenance    Barriers/Challenges to Care: []   Decline in memory    []   Language barrier     []   Emotional                  []   Limited mobility  []   Lack of motivation     []  Vision, hearing or cognitive impairment []   Knowledge []  Financial Barriers []   Lack of support  []   Pain []   Other [x]   None    Key pt activities to achieve better health:   [x]   Weight loss  [x]   Improved diabetic control  []   Decreased cholesterol levels  []   Decreased blood pressure  []     []     Upcoming appointments: Post op visit on 12/9.  Plan for next call:  Call on 12/5

## 2021-03-19 NOTE — Discharge Summary (Signed)
[place] SUMMARY    Name:  Alyssa [name], Alyssa [name]  MR#:  [id number]  DOB:  1981-04-08  ACCOUNT #:  [id number]  ADMIT DATE:  03/19/2021  DISCHARGE DATE:  03/26/2021      HOSPITAL COURSE:  The patient is a 40 year old female with a history of laparoscopic appendectomy, C-sections x2, colectomy, laparoscopic hysterectomy, tubal ligation, right salpingo-oophorectomy who had an incisional hernia repair status post with lysis of adhesions and small bowel resection x2 for small bowel obstruction on 02/26/2021 by Dr. Otila Kluver.  She then presented to [facility] ER with abdominal pain, nausea, vomiting.  CT of abdomen and pelvis demonstrated small bowel obstruction with transition point in the left lower quadrant and right lower quadrant.  Throughout her stay, the patient had a large issue with pain control and also with nausea and vomiting.  She did have small bowel follow-through that had shown that there was no obstruction.  However, the contrast did stay in the stomach for about 2 hours or so, so at that point I recommended decreasing narcotics and also getting a gastric emptying study since the patient reports she had been having a lot of GI issues before this current surgery and she is diabetic and I suspected gastroparesis.  The gastric emptying study was positive.  A dietary consult was placed so that the patient could be educated on the gastroparesis diet.  Ultimately, she was able to come off IV medication, just placed on p.o. medication, was doing well and finally was able to tolerate the diet without any other issues.  The patient was ultimately discharged on 03/26/2021 with instructions to follow up with Dr. Otila Kluver.      Edwena Bunde, MD      [id number]  D:  04/01/2021 15:06  T:  04/01/2021 19:00  JOB #:  [id number]

## 2021-03-20 LAB — CBC WITH AUTO DIFFERENTIAL
Basophils %: 1 % (ref 0–1)
Basophils Absolute: 0.1 10*3/uL (ref 0.0–0.1)
Eosinophils %: 1 % (ref 0–7)
Eosinophils Absolute: 0.1 10*3/uL (ref 0.0–0.4)
Granulocyte Absolute Count: 0.1 10*3/uL — ABNORMAL HIGH (ref 0.00–0.04)
Hematocrit: 33.1 % — ABNORMAL LOW (ref 35.0–47.0)
Hemoglobin: 10.4 g/dL — ABNORMAL LOW (ref 11.5–16.0)
Immature Granulocytes: 1 % — ABNORMAL HIGH (ref 0.0–0.5)
Lymphocytes %: 38 % (ref 12–49)
Lymphocytes Absolute: 3 10*3/uL (ref 0.8–3.5)
MCH: 25.6 PG — ABNORMAL LOW (ref 26.0–34.0)
MCHC: 31.4 g/dL (ref 30.0–36.5)
MCV: 81.3 FL (ref 80.0–99.0)
MPV: 10.8 FL (ref 8.9–12.9)
Monocytes %: 9 % (ref 5–13)
Monocytes Absolute: 0.7 10*3/uL (ref 0.0–1.0)
NRBC Absolute: 0 10*3/uL (ref 0.00–0.01)
Neutrophils %: 50 % (ref 32–75)
Neutrophils Absolute: 4.1 10*3/uL (ref 1.8–8.0)
Nucleated RBCs: 0 PER 100 WBC
Platelets: 547 10*3/uL — ABNORMAL HIGH (ref 150–400)
RBC: 4.07 M/uL (ref 3.80–5.20)
RDW: 15.4 % — ABNORMAL HIGH (ref 11.5–14.5)
WBC: 8 10*3/uL (ref 3.6–11.0)

## 2021-03-20 LAB — POCT GLUCOSE
POC Glucose: 183 mg/dL — ABNORMAL HIGH (ref 65–117)
POC Glucose: 160 mg/dL — ABNORMAL HIGH (ref 65–117)
POC Glucose: 151 mg/dL — ABNORMAL HIGH (ref 65–117)
POC Glucose: 133 mg/dL — ABNORMAL HIGH (ref 65–117)

## 2021-03-20 LAB — BASIC METABOLIC PANEL
Anion Gap: 9 mmol/L (ref 5–15)
BUN: 5 MG/DL — ABNORMAL LOW (ref 6–20)
Bun/Cre Ratio: 7 — ABNORMAL LOW (ref 12–20)
CO2: 26 mmol/L (ref 21–32)
Calcium: 8.4 MG/DL — ABNORMAL LOW (ref 8.5–10.1)
Chloride: 103 mmol/L (ref 97–108)
Creatinine: 0.68 MG/DL (ref 0.55–1.02)
ESTIMATED GLOMERULAR FILTRATION RATE: >60 mL/min/{1.73_m2} (ref >60)
Glucose: 161 mg/dL — ABNORMAL HIGH (ref 65–100)
Potassium: 3.3 mmol/L — ABNORMAL LOW (ref 3.5–5.1)
Sodium: 138 mmol/L (ref 136–145)

## 2021-03-20 LAB — HEMOGLOBIN A1C W/EAG
Hemoglobin A1C: 8 % — ABNORMAL HIGH (ref 4.0–5.6)
eAG: 183 mg/dL

## 2021-03-20 LAB — PHOSPHORUS
Phosphorus: 3.3 MG/DL (ref 2.6–4.7)
Phosphorus: 3.3 MG/DL (ref 2.6–4.7)

## 2021-03-20 LAB — MAGNESIUM
Magnesium: 1.5 mg/dL — ABNORMAL LOW (ref 1.6–2.4)
Magnesium: 1.5 mg/dL — ABNORMAL LOW (ref 1.6–2.4)

## 2021-03-20 LAB — CBC WITH AUTOMATED DIFF
ABS. BASOPHILS: 0.1 10*3/uL (ref 0.0–0.1)
ABS. EOSINOPHILS: 0.1 10*3/uL (ref 0.0–0.4)
ABS. IMM. GRANS.: 0.1 10*3/uL — ABNORMAL HIGH (ref 0.00–0.04)
ABS. LYMPHOCYTES: 3 10*3/uL (ref 0.8–3.5)
ABS. MONOCYTES: 0.7 10*3/uL (ref 0.0–1.0)
ABS. NEUTROPHILS: 4.1 10*3/uL (ref 1.8–8.0)
ABSOLUTE NRBC: 0 10*3/uL (ref 0.00–0.01)
BASOPHILS: 1 % (ref 0–1)
EOSINOPHILS: 1 % (ref 0–7)
HCT: 33.1 % — ABNORMAL LOW (ref 35.0–47.0)
HGB: 10.4 g/dL — ABNORMAL LOW (ref 11.5–16.0)
IMMATURE GRANULOCYTES: 1 % — ABNORMAL HIGH (ref 0.0–0.5)
LYMPHOCYTES: 38 % (ref 12–49)
MCH: 25.6 PG — ABNORMAL LOW (ref 26.0–34.0)
MCHC: 31.4 g/dL (ref 30.0–36.5)
MCV: 81.3 FL (ref 80.0–99.0)
MONOCYTES: 9 % (ref 5–13)
MPV: 10.8 FL (ref 8.9–12.9)
NEUTROPHILS: 50 % (ref 32–75)
NRBC: 0 PER 100 WBC
PLATELET: 547 10*3/uL — ABNORMAL HIGH (ref 150–400)
RBC: 4.07 M/uL (ref 3.80–5.20)
RDW: 15.4 % — ABNORMAL HIGH (ref 11.5–14.5)
WBC: 8 10*3/uL (ref 3.6–11.0)

## 2021-03-20 LAB — GLUCOSE, POC
Glucose (POC): 183 mg/dL — ABNORMAL HIGH (ref 65–117)
Glucose (POC): 160 mg/dL — ABNORMAL HIGH (ref 65–117)
Glucose (POC): 151 mg/dL — ABNORMAL HIGH (ref 65–117)
Glucose (POC): 133 mg/dL — ABNORMAL HIGH (ref 65–117)

## 2021-03-20 LAB — METABOLIC PANEL, BASIC
Anion gap: 9 mmol/L (ref 5–15)
BUN/Creatinine ratio: 7 — ABNORMAL LOW (ref 12–20)
BUN: 5 MG/DL — ABNORMAL LOW (ref 6–20)
CO2: 26 mmol/L (ref 21–32)
Calcium: 8.4 MG/DL — ABNORMAL LOW (ref 8.5–10.1)
Chloride: 103 mmol/L (ref 97–108)
Creatinine: 0.68 MG/DL (ref 0.55–1.02)
Glucose: 161 mg/dL — ABNORMAL HIGH (ref 65–100)
Potassium: 3.3 mmol/L — ABNORMAL LOW (ref 3.5–5.1)
Sodium: 138 mmol/L (ref 136–145)
eGFR: >60 mL/min/{1.73_m2} (ref >60)

## 2021-03-20 LAB — HEMOGLOBIN A1C WITH EAG
Est. average glucose: 183 mg/dL
Hemoglobin A1c: 8 % — ABNORMAL HIGH (ref 4.0–5.6)

## 2021-03-20 MED ORDER — HYDROMORPHONE 0.5 MG/0.5 ML SYRINGE
0.5 mg/ mL | INTRAMUSCULAR | Status: AC | PRN
Start: 2021-03-20 — End: 2021-03-20

## 2021-03-20 MED ORDER — LACTATED RINGERS IV
INTRAVENOUS | Status: AC
Start: 2021-03-20 — End: 2021-03-24
  Administered 2021-03-20 – 2021-03-24 (×13): via INTRAVENOUS

## 2021-03-20 MED ORDER — GLUCAGON 1 MG INJECTION
1 mg | INTRAMUSCULAR | Status: AC | PRN
Start: 2021-03-20 — End: 2021-03-26

## 2021-03-20 MED ORDER — HYDROMORPHONE 1 MG/ML INJECTION SOLUTION
1 mg/mL | INTRAMUSCULAR | Status: AC | PRN
Start: 2021-03-20 — End: 2021-03-20
  Administered 2021-03-20 (×2): via INTRAVENOUS

## 2021-03-20 MED ORDER — ONDANSETRON (PF) 4 MG/2 ML INJECTION
4 mg/2 mL | Freq: Four times a day (QID) | INTRAMUSCULAR | Status: AC | PRN
Start: 2021-03-20 — End: 2021-03-26
  Administered 2021-03-21 – 2021-03-24 (×5): via INTRAVENOUS

## 2021-03-20 MED ORDER — INSULIN LISPRO 100 UNIT/ML INJECTION
100 unit/mL | Freq: Four times a day (QID) | SUBCUTANEOUS | Status: AC
Start: 2021-03-20 — End: 2021-03-22
  Administered 2021-03-20 – 2021-03-22 (×7): via SUBCUTANEOUS

## 2021-03-20 MED ORDER — HYDROMORPHONE 1 MG/ML INJECTION SOLUTION
1 mg/mL | INTRAMUSCULAR | Status: AC | PRN
Start: 2021-03-20 — End: 2021-03-26
  Administered 2021-03-21 – 2021-03-25 (×17): via INTRAVENOUS

## 2021-03-20 MED ORDER — KETOROLAC TROMETHAMINE 30 MG/ML INJECTION
30 mg/mL (1 mL) | Freq: Four times a day (QID) | INTRAMUSCULAR | Status: AC
Start: 2021-03-20 — End: 2021-03-24
  Administered 2021-03-20 – 2021-03-25 (×20): via INTRAVENOUS

## 2021-03-20 MED ORDER — HYDROMORPHONE 0.5 MG/0.5 ML SYRINGE
0.5 mg/ mL | INTRAMUSCULAR | Status: DC | PRN
Start: 2021-03-20 — End: 2021-03-20

## 2021-03-20 MED ORDER — ENOXAPARIN 30 MG/0.3 ML SUB-Q SYRINGE
30 mg/0.3 mL | Freq: Two times a day (BID) | SUBCUTANEOUS | Status: AC
Start: 2021-03-20 — End: 2021-03-26
  Administered 2021-03-20 – 2021-03-26 (×13): via SUBCUTANEOUS

## 2021-03-20 MED ORDER — PROCHLORPERAZINE EDISYLATE 5 MG/ML INJECTION
5 mg/mL | Freq: Four times a day (QID) | INTRAMUSCULAR | Status: DC | PRN
Start: 2021-03-20 — End: 2021-03-25
  Administered 2021-03-20 – 2021-03-25 (×9): via INTRAVENOUS

## 2021-03-20 MED ORDER — HYDRALAZINE 20 MG/ML IJ SOLN
20 mg/mL | Freq: Four times a day (QID) | INTRAMUSCULAR | Status: AC | PRN
Start: 2021-03-20 — End: 2021-03-24
  Administered 2021-03-23 – 2021-03-24 (×3): via INTRAVENOUS

## 2021-03-20 MED ORDER — ACETAMINOPHEN 500 MG TAB
500 mg | Freq: Four times a day (QID) | ORAL | Status: AC
Start: 2021-03-20 — End: 2021-03-26
  Administered 2021-03-20 – 2021-03-26 (×20): via ORAL

## 2021-03-20 MED ORDER — DEXTROSE 10% IN WATER (D10W) IV
10 % | INTRAVENOUS | Status: AC | PRN
Start: 2021-03-20 — End: 2021-03-26

## 2021-03-20 MED ORDER — GLUCOSE 4 GRAM CHEWABLE TAB
4 gram | ORAL | Status: AC | PRN
Start: 2021-03-20 — End: 2021-03-26

## 2021-03-20 MED ORDER — HYDROMORPHONE 1 MG/ML INJECTION SOLUTION
1 mg/mL | INTRAMUSCULAR | Status: DC | PRN
Start: 2021-03-20 — End: 2021-03-20
  Administered 2021-03-20 (×3): via INTRAVENOUS

## 2021-03-20 MED FILL — HYDROMORPHONE (PF) 1 MG/ML IJ SOLN: 1 mg/mL | INTRAMUSCULAR | Qty: 1

## 2021-03-20 MED FILL — INSULIN LISPRO 100 UNIT/ML INJECTION: 100 unit/mL | SUBCUTANEOUS | Qty: 1

## 2021-03-20 MED FILL — ONDANSETRON (PF) 4 MG/2 ML INJECTION: 4 mg/2 mL | INTRAMUSCULAR | Qty: 2

## 2021-03-20 MED FILL — PROCHLORPERAZINE EDISYLATE 5 MG/ML INJECTION: 5 mg/mL | INTRAMUSCULAR | Qty: 2

## 2021-03-20 MED FILL — KETOROLAC TROMETHAMINE 30 MG/ML INJECTION: 30 mg/mL (1 mL) | INTRAMUSCULAR | Qty: 1

## 2021-03-20 MED FILL — LACTATED RINGERS IV: INTRAVENOUS | Qty: 1000

## 2021-03-20 MED FILL — ENOXAPARIN 30 MG/0.3 ML SUB-Q SYRINGE: 30 mg/0.3 mL | SUBCUTANEOUS | Qty: 0.3

## 2021-03-20 MED FILL — ACETAMINOPHEN 500 MG TAB: 500 mg | ORAL | Qty: 2

## 2021-03-20 NOTE — Progress Notes (Signed)
Bedside and Verbal shift change report given to [facility] (oncoming nurse) by Vangie Bicker (offgoing nurse). Report included the following information SBAR and Kardex.

## 2021-03-20 NOTE — Progress Notes (Signed)
Assessment / Plan:   40 yo with post op bowel obstruction  Continue NPO for bowel rest  OOB ambulate  Scheduled tylenol and toradol for severe pain reduce use of dilaudid as able as this contributed to bowel slowing  If little improvement will get SBFT [place], MD  [facility]  [phone]        General Surgery Daily Progress Note      Patient: Alyssa [name] MRN: [id number]  SSN: [id number]    Date of Birth: 1980-09-09  Age: 40 y.o.  Sex: female          Subjective:   Patient resting soundly after recent dose of dilaudid    Current Facility-Administered Medications   Medication Dose Route Frequency    prochlorperazine (COMPAZINE) injection 10 mg  10 mg IntraVENous Q6H PRN    ketorolac (TORADOL) injection 30 mg  30 mg IntraVENous Q6H    acetaminophen (TYLENOL) tablet 1,000 mg  1,000 mg Oral Q6H    HYDROmorphone (DILAUDID) injection 1 mg  1 mg IntraVENous Q3H PRN    lactated Ringers infusion  125 mL/hr IntraVENous CONTINUOUS    ondansetron (ZOFRAN) injection 4 mg  4 mg IntraVENous Q6H PRN    glucose chewable tablet 16 g  4 Tablet Oral PRN    glucagon (GLUCAGEN) injection 1 mg  1 mg IntraMUSCular PRN    dextrose 10% infusion 0-250 mL  0-250 mL IntraVENous PRN    insulin lispro (HUMALOG) injection   SubCUTAneous Q6H    enoxaparin (LOVENOX) injection 30 mg  30 mg SubCUTAneous Q12H    hydrALAZINE (APRESOLINE) 20 mg/mL injection 20 mg  20 mg IntraVENous Q6H PRN        Objective:   No intake/output data recorded.  12/01 1901 - 12/03 0700  In: 2000 [I.V.:2000]  Out: -   Patient Vitals for the past 8 hrs:   BP Temp Pulse Resp SpO2   03/20/21 1210 104/82 98.3 ??F (36.8 ??C) 96 18 95 %   03/20/21 0904 (!) 147/88 99.4 ??F (37.4 ??C) 93 18 97 %       Physical Exam:  General: Alert, cooperative, no distress, appears stated age.  Neck:  Supple, symmetrical, trachea midline  Lungs: Aerating well, no distress  Heart:  Regular rate and rhythm,  Abdomen: Soft, minimally tender.non distended, no  rebound or gaurding  Extremities: Extremities normal, atraumatic, no cyanosis or edema.  Skin:  Skin color, texture, turgor normal. No rashes or lesions    Labs:   Recent Labs     03/20/21  0130   WBC 8.0   HGB 10.4*   HCT 33.1*   PLT 547*     Recent Labs     03/20/21  0130 03/19/21  1450   NA 138 137   K 3.3* 3.6   CL 103 99   CO2 26 27   GLU 161* 201*   BUN 5* 4*   CREA 0.68 0.69   CA 8.4* 8.7   MG 1.5*  --    PHOS 3.3  --    ALB  --  3.7   TBILI  --  0.5   ALT  --  15           Active Problems:    SBO (small bowel obstruction) ([place]) (02/23/2021)        Problem List Items Addressed This Visit    None  Visit Diagnoses       Small  bowel obstruction (HCC)    -  Primary    Nausea and vomiting in adult        RLQ abdominal pain

## 2021-03-20 NOTE — Progress Notes (Signed)
Problem: Pain  Goal: *Control of Pain  Outcome: Progressing Towards Goal     Problem: Patient Education: Go to Patient Education Activity  Goal: Patient/Family Education  Outcome: Progressing Towards Goal     Problem: Impaired Skin Integrity/Pressure Injury Treatment  Goal: *Improvement of Existing Pressure Injury  Outcome: Progressing Towards Goal  Goal: *Prevention of pressure injury  Outcome: Progressing Towards Goal  Note: Pressure Injury Interventions:            Activity Interventions: Pressure redistribution bed/mattress(bed type)                               Problem: Patient Education: Go to Patient Education Activity  Goal: Patient/Family Education  Outcome: Progressing Towards Goal     Problem: Nausea/Vomiting (Adult)  Goal: *Absence of nausea/vomiting  Outcome: Progressing Towards Goal  Goal: *Palliation of nausea/vomiting (Palliative Care)  Outcome: Progressing Towards Goal     Problem: Patient Education: Go to Patient Education Activity  Goal: Patient/Family Education  Outcome: Progressing Towards Goal     Problem: Pain  Goal: *Control of Pain  Outcome: Progressing Towards Goal     Problem: Patient Education: Go to Patient Education Activity  Goal: Patient/Family Education  Outcome: Progressing Towards Goal

## 2021-03-21 LAB — POCT GLUCOSE
POC Glucose: 128 mg/dL — ABNORMAL HIGH (ref 65–117)
POC Glucose: 140 mg/dL — ABNORMAL HIGH (ref 65–117)
POC Glucose: 130 mg/dL — ABNORMAL HIGH (ref 65–117)
POC Glucose: 172 mg/dL — ABNORMAL HIGH (ref 65–117)

## 2021-03-21 LAB — CBC WITH AUTO DIFFERENTIAL
Basophils %: 1 % (ref 0–1)
Basophils Absolute: 0.1 10*3/uL (ref 0.0–0.1)
Eosinophils %: 5 % (ref 0–7)
Eosinophils Absolute: 0.3 10*3/uL (ref 0.0–0.4)
Granulocyte Absolute Count: 0 10*3/uL (ref 0.00–0.04)
Hematocrit: 30.1 % — ABNORMAL LOW (ref 35.0–47.0)
Hemoglobin: 9.3 g/dL — ABNORMAL LOW (ref 11.5–16.0)
Immature Granulocytes: 0 % (ref 0.0–0.5)
Lymphocytes %: 40 % (ref 12–49)
Lymphocytes Absolute: 2.7 10*3/uL (ref 0.8–3.5)
MCH: 25.2 PG — ABNORMAL LOW (ref 26.0–34.0)
MCHC: 30.9 g/dL (ref 30.0–36.5)
MCV: 81.6 FL (ref 80.0–99.0)
MPV: 11.1 FL (ref 8.9–12.9)
Monocytes %: 8 % (ref 5–13)
Monocytes Absolute: 0.5 10*3/uL (ref 0.0–1.0)
NRBC Absolute: 0 10*3/uL (ref 0.00–0.01)
Neutrophils %: 46 % (ref 32–75)
Neutrophils Absolute: 3.1 10*3/uL (ref 1.8–8.0)
Nucleated RBCs: 0 PER 100 WBC
Platelets: 476 10*3/uL — ABNORMAL HIGH (ref 150–400)
RBC: 3.69 M/uL — ABNORMAL LOW (ref 3.80–5.20)
RDW: 15.5 % — ABNORMAL HIGH (ref 11.5–14.5)
WBC: 6.7 10*3/uL (ref 3.6–11.0)

## 2021-03-21 LAB — BASIC METABOLIC PANEL
Anion Gap: 9 mmol/L (ref 5–15)
BUN: 9 MG/DL (ref 6–20)
Bun/Cre Ratio: 16 (ref 12–20)
CO2: 27 mmol/L (ref 21–32)
Calcium: 8.7 MG/DL (ref 8.5–10.1)
Chloride: 105 mmol/L (ref 97–108)
Creatinine: 0.55 MG/DL (ref 0.55–1.02)
ESTIMATED GLOMERULAR FILTRATION RATE: >60 mL/min/{1.73_m2} (ref >60)
Glucose: 143 mg/dL — ABNORMAL HIGH (ref 65–100)
Potassium: 3.3 mmol/L — ABNORMAL LOW (ref 3.5–5.1)
Sodium: 141 mmol/L (ref 136–145)

## 2021-03-21 LAB — MAGNESIUM
Magnesium: 1.6 mg/dL (ref 1.6–2.4)
Magnesium: 1.6 mg/dL (ref 1.6–2.4)

## 2021-03-21 LAB — PHOSPHORUS
Phosphorus: 3.9 MG/DL (ref 2.6–4.7)
Phosphorus: 3.9 MG/DL (ref 2.6–4.7)

## 2021-03-21 LAB — CBC WITH AUTOMATED DIFF
ABS. BASOPHILS: 0.1 10*3/uL (ref 0.0–0.1)
ABS. EOSINOPHILS: 0.3 10*3/uL (ref 0.0–0.4)
ABS. IMM. GRANS.: 0 10*3/uL (ref 0.00–0.04)
ABS. LYMPHOCYTES: 2.7 10*3/uL (ref 0.8–3.5)
ABS. MONOCYTES: 0.5 10*3/uL (ref 0.0–1.0)
ABS. NEUTROPHILS: 3.1 10*3/uL (ref 1.8–8.0)
ABSOLUTE NRBC: 0 10*3/uL (ref 0.00–0.01)
BASOPHILS: 1 % (ref 0–1)
EOSINOPHILS: 5 % (ref 0–7)
HCT: 30.1 % — ABNORMAL LOW (ref 35.0–47.0)
HGB: 9.3 g/dL — ABNORMAL LOW (ref 11.5–16.0)
IMMATURE GRANULOCYTES: 0 % (ref 0.0–0.5)
LYMPHOCYTES: 40 % (ref 12–49)
MCH: 25.2 PG — ABNORMAL LOW (ref 26.0–34.0)
MCHC: 30.9 g/dL (ref 30.0–36.5)
MCV: 81.6 FL (ref 80.0–99.0)
MONOCYTES: 8 % (ref 5–13)
MPV: 11.1 FL (ref 8.9–12.9)
NEUTROPHILS: 46 % (ref 32–75)
NRBC: 0 PER 100 WBC
PLATELET: 476 10*3/uL — ABNORMAL HIGH (ref 150–400)
RBC: 3.69 M/uL — ABNORMAL LOW (ref 3.80–5.20)
RDW: 15.5 % — ABNORMAL HIGH (ref 11.5–14.5)
WBC: 6.7 10*3/uL (ref 3.6–11.0)

## 2021-03-21 LAB — METABOLIC PANEL, BASIC
Anion gap: 9 mmol/L (ref 5–15)
BUN/Creatinine ratio: 16 (ref 12–20)
BUN: 9 MG/DL (ref 6–20)
CO2: 27 mmol/L (ref 21–32)
Calcium: 8.7 MG/DL (ref 8.5–10.1)
Chloride: 105 mmol/L (ref 97–108)
Creatinine: 0.55 MG/DL (ref 0.55–1.02)
Glucose: 143 mg/dL — ABNORMAL HIGH (ref 65–100)
Potassium: 3.3 mmol/L — ABNORMAL LOW (ref 3.5–5.1)
Sodium: 141 mmol/L (ref 136–145)
eGFR: >60 mL/min/{1.73_m2} (ref >60)

## 2021-03-21 LAB — GLUCOSE, POC
Glucose (POC): 128 mg/dL — ABNORMAL HIGH (ref 65–117)
Glucose (POC): 140 mg/dL — ABNORMAL HIGH (ref 65–117)
Glucose (POC): 130 mg/dL — ABNORMAL HIGH (ref 65–117)
Glucose (POC): 172 mg/dL — ABNORMAL HIGH (ref 65–117)

## 2021-03-21 MED FILL — HYDROMORPHONE (PF) 1 MG/ML IJ SOLN: 1 mg/mL | INTRAMUSCULAR | Qty: 1

## 2021-03-21 MED FILL — ONDANSETRON (PF) 4 MG/2 ML INJECTION: 4 mg/2 mL | INTRAMUSCULAR | Qty: 2

## 2021-03-21 MED FILL — KETOROLAC TROMETHAMINE 30 MG/ML INJECTION: 30 mg/mL (1 mL) | INTRAMUSCULAR | Qty: 1

## 2021-03-21 MED FILL — ACETAMINOPHEN 500 MG TAB: 500 mg | ORAL | Qty: 2

## 2021-03-21 MED FILL — PROCHLORPERAZINE EDISYLATE 5 MG/ML INJECTION: 5 mg/mL | INTRAMUSCULAR | Qty: 2

## 2021-03-21 MED FILL — ENOXAPARIN 30 MG/0.3 ML SUB-Q SYRINGE: 30 mg/0.3 mL | SUBCUTANEOUS | Qty: 0.3

## 2021-03-21 MED FILL — INSULIN LISPRO 100 UNIT/ML INJECTION: 100 unit/mL | SUBCUTANEOUS | Qty: 1

## 2021-03-21 NOTE — Progress Notes (Signed)
Problem: Impaired Skin Integrity/Pressure Injury Treatment  Goal: *Prevention of pressure injury  Description: Document [name] and appropriate interventions in the flowsheet.  Outcome: Progressing Towards Goal  Note: Pressure Injury Interventions:            Activity Interventions: Assess need for specialty bed    Mobility Interventions: Assess need for specialty bed, HOB 30 degrees or less    Nutrition Interventions: Document food/fluid/supplement intake, Offer support with meals,snacks and hydration                     Problem: Pain  Goal: *Control of Pain  Outcome: Progressing Towards Goal     Problem: Falls - Risk of  Goal: *Absence of Falls  Description: Document Schmid Fall Risk and appropriate interventions in the flowsheet.  Outcome: Progressing Towards Goal  Note: Fall Risk Interventions:            Medication Interventions: Teach patient to arise slowly    Elimination Interventions: Call light in reach, Patient to call for help with toileting needs, Toilet paper/wipes in reach

## 2021-03-21 NOTE — Progress Notes (Signed)
Bedside and Verbal shift change report given to Louretta Shorten (Soil scientist) by Nira Conn (offgoing nurse). Report included the following information SBAR, Kardex, Intake/Output, MAR, and Recent Results.

## 2021-03-21 NOTE — Progress Notes (Signed)
Assessment / Plan:   Feels better today  +BM  Start clears as tolerates  OOB ambulate    Geraldo Pitter, MD  [facility]  [phone]        General Surgery Daily Progress Note      Patient: Alyssa [name] MRN: [id number]  SSN: [id number]    Date of Birth: [birth year]  Age: 40 y.o.  Sex: female          Subjective:   Patient feels better    Current Facility-Administered Medications   Medication Dose Route Frequency    prochlorperazine (COMPAZINE) injection 10 mg  10 mg IntraVENous Q6H PRN    ketorolac (TORADOL) injection 30 mg  30 mg IntraVENous Q6H    acetaminophen (TYLENOL) tablet 1,000 mg  1,000 mg Oral Q6H    HYDROmorphone (DILAUDID) injection 1 mg  1 mg IntraVENous Q3H PRN    lactated Ringers infusion  125 mL/hr IntraVENous CONTINUOUS    ondansetron (ZOFRAN) injection 4 mg  4 mg IntraVENous Q6H PRN    glucose chewable tablet 16 g  4 Tablet Oral PRN    glucagon (GLUCAGEN) injection 1 mg  1 mg IntraMUSCular PRN    dextrose 10% infusion 0-250 mL  0-250 mL IntraVENous PRN    insulin lispro (HUMALOG) injection   SubCUTAneous Q6H    enoxaparin (LOVENOX) injection 30 mg  30 mg SubCUTAneous Q12H    hydrALAZINE (APRESOLINE) 20 mg/mL injection 20 mg  20 mg IntraVENous Q6H PRN        Objective:   No intake/output data recorded.  12/02 1901 - 12/04 0700  In: 1000 [I.V.:1000]  Out: -   Patient Vitals for the past 8 hrs:   BP Temp Pulse Resp SpO2   03/21/21 1143 (!) 163/82 98.8 ??F (37.1 ??C) 81 18 96 %   03/21/21 0920 (!) 147/79 97.6 ??F (36.4 ??C) 84 18 97 %       Physical Exam:  General: Alert, cooperative, no distress, appears stated age.  Neck:  Supple, symmetrical, trachea midline  Lungs: Aerating well no distress  Heart:  Regular rate and rhythm  Abdomen: Soft, mild diffuse tenderness. No distention  Extremities: Extremities normal, atraumatic, no cyanosis or edema.  Skin:  Skin color, texture, turgor normal. No rashes or lesions    Labs:   Recent Labs     03/21/21  0054   WBC 6.7   HGB 9.3*   HCT  30.1*   PLT 476*     Recent Labs     03/21/21  0054 03/20/21  0130 03/19/21  1450   NA 141   < > 137   K 3.3*   < > 3.6   CL 105   < > 99   CO2 27   < > 27   GLU 143*   < > 201*   BUN 9   < > 4*   CREA 0.55   < > 0.69   CA 8.7   < > 8.7   MG 1.6   < >  --    PHOS 3.9   < >  --    ALB  --   --  3.7   TBILI  --   --  0.5   ALT  --   --  15    < > = values in this interval not displayed.           Active Problems:    SBO (small bowel obstruction) ([place]) (02/23/2021)  Problem List Items Addressed This Visit    None  Visit Diagnoses       Small bowel obstruction (HCC)    -  Primary    Nausea and vomiting in adult        RLQ abdominal pain

## 2021-03-21 NOTE — Progress Notes (Signed)
Problem: Pain  Goal: *Control of Pain  Outcome: Progressing Towards Goal

## 2021-03-22 LAB — POCT GLUCOSE
POC Glucose: 138 mg/dL — ABNORMAL HIGH (ref 65–117)
POC Glucose: 147 mg/dL — ABNORMAL HIGH (ref 65–117)
POC Glucose: 158 mg/dL — ABNORMAL HIGH (ref 65–117)
POC Glucose: 116 mg/dL (ref 65–117)

## 2021-03-22 LAB — BASIC METABOLIC PANEL
Anion Gap: 8 mmol/L (ref 5–15)
BUN: 4 MG/DL — ABNORMAL LOW (ref 6–20)
Bun/Cre Ratio: 8 — ABNORMAL LOW (ref 12–20)
CO2: 28 mmol/L (ref 21–32)
Calcium: 8.9 MG/DL (ref 8.5–10.1)
Chloride: 104 mmol/L (ref 97–108)
Creatinine: 0.53 MG/DL — ABNORMAL LOW (ref 0.55–1.02)
ESTIMATED GLOMERULAR FILTRATION RATE: >60 mL/min/{1.73_m2} (ref >60)
Glucose: 142 mg/dL — ABNORMAL HIGH (ref 65–100)
Potassium: 3.3 mmol/L — ABNORMAL LOW (ref 3.5–5.1)
Sodium: 140 mmol/L (ref 136–145)

## 2021-03-22 LAB — CBC WITH AUTO DIFFERENTIAL
Basophils %: 1 % (ref 0–1)
Basophils Absolute: 0.1 10*3/uL (ref 0.0–0.1)
Eosinophils %: 6 % (ref 0–7)
Eosinophils Absolute: 0.3 10*3/uL (ref 0.0–0.4)
Granulocyte Absolute Count: 0 10*3/uL (ref 0.00–0.04)
Hematocrit: 31.9 % — ABNORMAL LOW (ref 35.0–47.0)
Hemoglobin: 9.9 g/dL — ABNORMAL LOW (ref 11.5–16.0)
Immature Granulocytes: 0 % (ref 0.0–0.5)
Lymphocytes %: 46 % (ref 12–49)
Lymphocytes Absolute: 2.5 10*3/uL (ref 0.8–3.5)
MCH: 25.4 PG — ABNORMAL LOW (ref 26.0–34.0)
MCHC: 31 g/dL (ref 30.0–36.5)
MCV: 81.8 FL (ref 80.0–99.0)
MPV: 11 FL (ref 8.9–12.9)
Monocytes %: 9 % (ref 5–13)
Monocytes Absolute: 0.5 10*3/uL (ref 0.0–1.0)
NRBC Absolute: 0 10*3/uL (ref 0.00–0.01)
Neutrophils %: 38 % (ref 32–75)
Neutrophils Absolute: 2.1 10*3/uL (ref 1.8–8.0)
Nucleated RBCs: 0 PER 100 WBC
Platelets: 463 10*3/uL — ABNORMAL HIGH (ref 150–400)
RBC: 3.9 M/uL (ref 3.80–5.20)
RDW: 15.4 % — ABNORMAL HIGH (ref 11.5–14.5)
WBC: 5.4 10*3/uL (ref 3.6–11.0)

## 2021-03-22 LAB — PHOSPHORUS
Phosphorus: 3.9 MG/DL (ref 2.6–4.7)
Phosphorus: 3.9 MG/DL (ref 2.6–4.7)

## 2021-03-22 LAB — MAGNESIUM
Magnesium: 1.4 mg/dL — ABNORMAL LOW (ref 1.6–2.4)
Magnesium: 1.4 mg/dL — ABNORMAL LOW (ref 1.6–2.4)

## 2021-03-22 LAB — GLUCOSE, POC
Glucose (POC): 138 mg/dL — ABNORMAL HIGH (ref 65–117)
Glucose (POC): 147 mg/dL — ABNORMAL HIGH (ref 65–117)
Glucose (POC): 158 mg/dL — ABNORMAL HIGH (ref 65–117)
Glucose (POC): 116 mg/dL (ref 65–117)

## 2021-03-22 LAB — METABOLIC PANEL, BASIC
Anion gap: 8 mmol/L (ref 5–15)
BUN/Creatinine ratio: 8 — ABNORMAL LOW (ref 12–20)
BUN: 4 MG/DL — ABNORMAL LOW (ref 6–20)
CO2: 28 mmol/L (ref 21–32)
Calcium: 8.9 MG/DL (ref 8.5–10.1)
Chloride: 104 mmol/L (ref 97–108)
Creatinine: 0.53 MG/DL — ABNORMAL LOW (ref 0.55–1.02)
Glucose: 142 mg/dL — ABNORMAL HIGH (ref 65–100)
Potassium: 3.3 mmol/L — ABNORMAL LOW (ref 3.5–5.1)
Sodium: 140 mmol/L (ref 136–145)
eGFR: >60 mL/min/{1.73_m2} (ref >60)

## 2021-03-22 LAB — CBC WITH AUTOMATED DIFF
ABS. BASOPHILS: 0.1 10*3/uL (ref 0.0–0.1)
ABS. EOSINOPHILS: 0.3 10*3/uL (ref 0.0–0.4)
ABS. IMM. GRANS.: 0 10*3/uL (ref 0.00–0.04)
ABS. LYMPHOCYTES: 2.5 10*3/uL (ref 0.8–3.5)
ABS. MONOCYTES: 0.5 10*3/uL (ref 0.0–1.0)
ABS. NEUTROPHILS: 2.1 10*3/uL (ref 1.8–8.0)
ABSOLUTE NRBC: 0 10*3/uL (ref 0.00–0.01)
BASOPHILS: 1 % (ref 0–1)
EOSINOPHILS: 6 % (ref 0–7)
HCT: 31.9 % — ABNORMAL LOW (ref 35.0–47.0)
HGB: 9.9 g/dL — ABNORMAL LOW (ref 11.5–16.0)
IMMATURE GRANULOCYTES: 0 % (ref 0.0–0.5)
LYMPHOCYTES: 46 % (ref 12–49)
MCH: 25.4 PG — ABNORMAL LOW (ref 26.0–34.0)
MCHC: 31 g/dL (ref 30.0–36.5)
MCV: 81.8 FL (ref 80.0–99.0)
MONOCYTES: 9 % (ref 5–13)
MPV: 11 FL (ref 8.9–12.9)
NEUTROPHILS: 38 % (ref 32–75)
NRBC: 0 PER 100 WBC
PLATELET: 463 10*3/uL — ABNORMAL HIGH (ref 150–400)
RBC: 3.9 M/uL (ref 3.80–5.20)
RDW: 15.4 % — ABNORMAL HIGH (ref 11.5–14.5)
WBC: 5.4 10*3/uL (ref 3.6–11.0)

## 2021-03-22 MED ORDER — POLYETHYLENE GLYCOL 3350 17 GRAM (100 %) ORAL POWDER PACKET
17 gram | Freq: Every day | ORAL | Status: AC | PRN
Start: 2021-03-22 — End: 2021-03-26
  Administered 2021-03-22: 17:00:00 via ORAL

## 2021-03-22 MED ORDER — INSULIN LISPRO 100 UNIT/ML INJECTION
100 unit/mL | Freq: Four times a day (QID) | SUBCUTANEOUS | Status: AC
Start: 2021-03-22 — End: 2021-03-26
  Administered 2021-03-23 – 2021-03-26 (×7): via SUBCUTANEOUS

## 2021-03-22 MED FILL — HYDROMORPHONE (PF) 1 MG/ML IJ SOLN: 1 mg/mL | INTRAMUSCULAR | Qty: 1

## 2021-03-22 MED FILL — INSULIN LISPRO 100 UNIT/ML INJECTION: 100 unit/mL | SUBCUTANEOUS | Qty: 1

## 2021-03-22 MED FILL — ENOXAPARIN 30 MG/0.3 ML SUB-Q SYRINGE: 30 mg/0.3 mL | SUBCUTANEOUS | Qty: 0.3

## 2021-03-22 MED FILL — POLYETHYLENE GLYCOL 3350 17 GRAM (100 %) ORAL POWDER PACKET: 17 gram | ORAL | Qty: 1

## 2021-03-22 MED FILL — KETOROLAC TROMETHAMINE 30 MG/ML INJECTION: 30 mg/mL (1 mL) | INTRAMUSCULAR | Qty: 1

## 2021-03-22 MED FILL — ACETAMINOPHEN 500 MG TAB: 500 mg | ORAL | Qty: 2

## 2021-03-22 NOTE — Progress Notes (Signed)
Bedside shift change report given to [place] (oncoming nurse) by Martyn Ehrich (offgoing nurse). Report included the following information .   SBAR, Kardex, Intake/Output, MAR, and Recent Results
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
## 2021-03-22 NOTE — Progress Notes (Signed)
 Comprehensive Nutrition Assessment    Type and Reason for Visit: Initial    Nutrition Recommendations/Plan:   Advance diet per surgery - recommend regular, 4 carb choice, GI bland when able (per surgery)  Provide Gelatein once daily to increase kcal/protein intake (80 kcal, <1 g carbs, 20 g protein)      Malnutrition Assessment:  Malnutrition Status:  Insufficient data (03/22/21 1446)    Context:  Acute illness     Findings of the 6 clinical characteristics of malnutrition:   Energy Intake:  Mild decrease in energy intake (specify) (npo/clears x 3 days)  Weight Loss:  Unable to assess     Body Fat Loss:  Unable to assess,     Muscle Mass Loss:  Unable to assess,    Fluid Accumulation:  No significant fluid accumulation,    Grip Strength:  Not performed     Nutrition Assessment:     Pt is a 40 year old female admitted with SBO (small bowel obstruction) (HCC) [K56.609]. 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  BPA for wt loss >33#. Per documentation, patient has lost 37# (13.2%) within 1 week, highly unlikely. RD to order new standing scale weight. RD attempted to visit patient 3x today - with another provider or unable to speak with RD each occurrence. Chart reviewed. During previous admission last month, patient stated UBW ~285#. NKFA. No noted nausea/vomiting/diarrhea this admission. No chewing/swallowing problems. Diet was advanced to clear liquids yesterday - expect advancement to full liquids today, as patient without s/s of intolerance.     Wt Readings from Last 10 Encounters:   03/22/21 109.9 kg (242 lb 3.2 oz)   03/15/21 126.6 kg (279 lb)   03/12/21 127 kg (279 lb 15.8 oz)   02/26/21 127 kg (280 lb)   09/17/20 123.8 kg  (273 lb)   08/15/20 127.1 kg (280 lb 3.3 oz)   07/14/20 127.9 kg (282 lb)   05/26/20 130.6 kg (288 lb)   05/19/20 130.6 kg (288 lb)   04/25/20 131.1 kg (289 lb 0.4 oz)     Patient Vitals for the past 168 hrs:   % Diet Eaten   03/22/21 0805 26 - 50%       Nutrition Related Findings:      Wound Type: Surgical incision (abd)  Last Bowel Movement Date: 03/21/21  Stool Appearance: Formed  Abdominal Assessment: Tender  Appetite: Fair (tolerating clears)  Bowel Sounds: Hypoactive   Edema:No data recorded    Nutr. Labs:    Lab Results   Component Value Date/Time    GFR est AA >60 08/15/2020 09:21 AM    GFR est non-AA >60 08/15/2020 09:21 AM    Creatinine 0.53 (L) 03/22/2021 05:14 AM    BUN 4 (L) 03/22/2021 05:14 AM    Sodium 140 03/22/2021 05:14 AM    Potassium 3.3 (L) 03/22/2021 05:14 AM    Chloride 104 03/22/2021 05:14 AM    CO2 28 03/22/2021 05:14 AM       Lab Results   Component Value Date/Time    Glucose 142 (H) 03/22/2021 05:14 AM    Glucose (POC) 158 (H) 03/22/2021 11:58 AM       Lab Results   Component Value Date/Time    Hemoglobin A1c 8.0 (H)  03/20/2021 01:30 AM    Hemoglobin A1c (POC) 9.4 01/21/2021 12:11 PM       Nutr. Meds:  Lovenox , glucagon  PRN, hydralazine PRN, humalog , LR @ 125, miralax  PRN, zofran  PRN, compazine  PRN       Current Nutrition Intake & Therapies:  Average Meal Intake: 26-50% (single meal; clear liquids)  Average Supplement Intake: None ordered  ADULT DIET Clear Liquid    Anthropometric Measures:  Height: 5' 3 (160 cm)  Ideal Body Weight (IBW): 115 lbs (52 kg)     Current Body Wt:  109.9 kg (242 lb 4.6 oz), 210.7 % IBW.    Current BMI (kg/m2): 42.9  Usual Body Weight: 129.3 kg (285 lb)  % Weight Change (Calculated): -15  Weight Adjustment: No adjustment                 BMI Category: Obese class 3 (BMI 40.0 or greater)    Estimated Daily Nutrient Needs:  Energy Requirements Based On: Formula  Weight Used for Energy Requirements: Current  Energy (kcal/day): 2428 (MSJ x 1.3 x 1.1)  Weight Used for  Protein Requirements: Current  Protein (g/day): 87-110 (0.8-1.0 g/kg)  Method Used for Fluid Requirements: 1 ml/kcal  Fluid (ml/day): 2428    Nutrition Diagnosis:   Inadequate oral intake related to inadequate protein-energy intake as evidenced by NPO or clear liquid status due to medical condition    Nutrition Interventions:   Food and/or Nutrient Delivery: Modify current diet, Start oral nutrition supplement  Nutrition Education/Counseling: No recommendations at this time  Coordination of Nutrition Care: Continue to monitor while inpatient, Interdisciplinary rounds  Plan of Care discussed with: IDR team    Goals:     Goals: PO intake 50% or greater, by next RD assessment       Nutrition Monitoring and Evaluation:   Behavioral-Environmental Outcomes: None identified  Food/Nutrient Intake Outcomes: Diet advancement/tolerance, Food and nutrient intake, Supplement intake  Physical Signs/Symptoms Outcomes: Biochemical data, Weight, GI status    Discharge Planning:    Too soon to determine    Sharyne Mania, MS, RD  Contact: Ext: [phone], or via [employer]

## 2021-03-22 NOTE — Progress Notes (Signed)
General Surgery    Patient is a 40 year-old woman with history of laparoscopic appendectomy, C-section x2, colectomy, laparoscopic hysterectomy, tubal ligation, right salpingo-oophorectomy, and incisional hernia repair s/p open lysis of adhesions and small bowel resection x2 for small bowel obstruction on 02/26/2021 who presented to the [facility] ER with abdominal pain and nausea/vomiting.  CT abdomen/pelvis demonstrated dilated loops of ileum with transition point in the LLQ and the RLQ, distal to the anastomosis (per my read).    HPI: Patient states her pain is better.  Tol clears; denies nausea/vomiting because when she feels full, she stops so that it doesn't progress to vomiting; no hiccupping.  BM yest but none today and no real flatus.  No family currently at bedside.    No current facility-administered medications on file prior to encounter.     Current Outpatient Medications on File Prior to Encounter   Medication Sig Dispense Refill    ondansetron (ZOFRAN ODT) 4 mg disintegrating tablet Take 1 Tablet by mouth every eight (8) hours as needed for Nausea or Vomiting. (Patient not taking: Reported on 03/10/2021) 30 Tablet 0    gabapentin (NEURONTIN) 300 mg capsule Take 1 Capsule by mouth nightly for 15 days. Max Daily Amount: 300 mg. (Patient not taking: Reported on 03/19/2021) 15 Capsule 0    metFORMIN ER (GLUCOPHAGE XR) 500 mg tablet Take 4 Tablets by mouth daily (with dinner). 360 Tablet 3    lisinopriL (PRINIVIL, ZESTRIL) 20 mg tablet Take 1 Tablet by mouth daily. 30 Tablet 1    flash glucose sensor (FreeStyle Libre 2 Sensor) kit Change sensor every 14 days 6 Kit 3    insulin glargine U-300 conc (Toujeo Max U-300 SoloStar) 300 unit/mL (3 mL) inpn 80 Units by SubCUTAneous route nightly. Indications: type 2 diabetes mellitus 24 mL 3    methocarbamoL (ROBAXIN) 750 mg tablet Take 1 Tablet by mouth three (3) times daily as needed for Muscle Spasm(s). (Patient not taking: No sig reported) 60 Tablet 0     ondansetron (ZOFRAN ODT) 4 mg disintegrating tablet Take 1 Tablet by mouth every eight (8) hours as needed for Nausea or Vomiting. 12 Tablet 0    rosuvastatin (CRESTOR) 10 mg tablet Take 1 Tablet by mouth nightly. 90 Tablet 3    glucose blood VI test strips (Prodigy No Coding) strip Use to test blood sugars 4 times daily or as directed 400 Strip 3    valACYclovir (VALTREX) 500 mg tablet Take 1 Tablet by mouth two (2) times a day. 180 Tablet 0    omega 3-DHA-EPA-fish oil 1,000 mg (120 mg-180 mg) capsule Take 1 Capsule by mouth daily.      Insulin Needles, Disposable, (Comfort EZ Pen Needles) 31 gauge x 5/16" ndle Leader brand pen needle to use once a day with basal insulin 100 Package 3    lancets misc Use to test blood sugars once a day as directed 100 Each 3       Patient Vitals for the past 24 hrs:   Temp Pulse Resp BP SpO2   03/22/21 1705 98.3 ??F (36.8 ??C) 90 16 (!) 157/92 95 %   03/22/21 1248 98.4 ??F (36.9 ??C) 93 16 (!) 150/92 96 %   03/22/21 0845 97.8 ??F (36.6 ??C) 92 16 135/86 97 %   03/22/21 0433 98.2 ??F (36.8 ??C) 87 17 (!) 161/93 97 %   03/22/21 0000 98 ??F (36.7 ??C) 86 18 (!) 154/94 98 %   03/21/21 2024 98.9 ??F (37.2 ??C) 97  17 (!) 159/92 95 %     Date 03/21/21 0700 - 03/22/21 0659 03/22/21 0700 - 03/23/21 0659   Shift 0700-1859 1900-0659 24 Hour Total 0700-1859 4166-0630 24 Hour Total   INTAKE   P.O.  300 300 500  500     P.O.  300 300 500  500   I.V.  6970.8 6970.8 950  950     I.V.  1000 1000 0  0     Volume (lactated Ringers infusion)  5970.8 5970.8 950  950     Volume (dextrose 10% infusion 0-250 mL)    0  0   Blood  0 0 0  0     Autotransfused  0 0 0  0   Other  0 0 0  0     Other  0 0 0  0   Shift Total(mL/kg)  7270.8 7270.8 1450(13.2)  1450(13.2)   OUTPUT   Urine    0  0     Urine Voided    0  0     Urine Occurrence(s)  5 x 5 x 2 x  2 x   Emesis/NG output    0  0     Emesis    0  0   Other    0  0     Other Output    0  0   Stool    0  0     Stool    0  0   Blood    0  0     Quantitative Blood Loss     0  0     Blood    0  0   Shift Total(mL/kg)    0(0)  0(0)   NET  7270.8 7270.8 1450  1450   Weight (kg)    109.9 109.9 109.9     GEN: no acute distress  ABD: morbidly obese; soft; (+) mild RLQ/peri-incisional tenderness; non-distended; healed midline and port site scars      Recent Results (from the past 24 hour(s))   GLUCOSE, POC    Collection Time: 03/21/21 11:53 PM   Result Value Ref Range    Glucose (POC) 138 (H) 65 - 117 mg/dL    Performed by Clint Lipps Joy    CBC WITH AUTOMATED DIFF    Collection Time: 03/22/21  5:14 AM   Result Value Ref Range    WBC 5.4 3.6 - 11.0 K/uL    RBC 3.90 3.80 - 5.20 M/uL    HGB 9.9 (L) 11.5 - 16.0 g/dL    HCT 31.9 (L) 35.0 - 47.0 %    MCV 81.8 80.0 - 99.0 FL    MCH 25.4 (L) 26.0 - 34.0 PG    MCHC 31.0 30.0 - 36.5 g/dL    RDW 15.4 (H) 11.5 - 14.5 %    PLATELET 463 (H) 150 - 400 K/uL    MPV 11.0 8.9 - 12.9 FL    NRBC 0.0 0 PER 100 WBC    ABSOLUTE NRBC 0.00 0.00 - 0.01 K/uL    NEUTROPHILS 38 32 - 75 %    LYMPHOCYTES 46 12 - 49 %    MONOCYTES 9 5 - 13 %    EOSINOPHILS 6 0 - 7 %    BASOPHILS 1 0 - 1 %    IMMATURE GRANULOCYTES 0 0.0 - 0.5 %    ABS. NEUTROPHILS 2.1 1.8 - 8.0 K/UL    ABS. LYMPHOCYTES  2.5 0.8 - 3.5 K/UL    ABS. MONOCYTES 0.5 0.0 - 1.0 K/UL    ABS. EOSINOPHILS 0.3 0.0 - 0.4 K/UL    ABS. BASOPHILS 0.1 0.0 - 0.1 K/UL    ABS. IMM. GRANS. 0.0 0.00 - 0.04 K/UL    DF AUTOMATED     METABOLIC PANEL, BASIC    Collection Time: 03/22/21  5:14 AM   Result Value Ref Range    Sodium 140 136 - 145 mmol/L    Potassium 3.3 (L) 3.5 - 5.1 mmol/L    Chloride 104 97 - 108 mmol/L    CO2 28 21 - 32 mmol/L    Anion gap 8 5 - 15 mmol/L    Glucose 142 (H) 65 - 100 mg/dL    BUN 4 (L) 6 - 20 MG/DL    Creatinine 0.53 (L) 0.55 - 1.02 MG/DL    BUN/Creatinine ratio 8 (L) 12 - 20      eGFR >60 >60 ml/min/1.11m    Calcium 8.9 8.5 - 10.1 MG/DL   MAGNESIUM    Collection Time: 03/22/21  5:14 AM   Result Value Ref Range    Magnesium 1.4 (L) 1.6 - 2.4 mg/dL   PHOSPHORUS    Collection Time: 03/22/21  5:14 AM    Result Value Ref Range    Phosphorus 3.9 2.6 - 4.7 MG/DL   GLUCOSE, POC    Collection Time: 03/22/21  5:34 AM   Result Value Ref Range    Glucose (POC) 147 (H) 65 - 117 mg/dL    Performed by CClint LippsJoy    GLUCOSE, POC    Collection Time: 03/22/21 11:58 AM   Result Value Ref Range    Glucose (POC) 158 (H) 65 - 117 mg/dL    Performed by GPles Specter   GLUCOSE, POC    Collection Time: 03/22/21  4:12 PM   Result Value Ref Range    Glucose (POC) 116 65 - 117 mg/dL    Performed by GPles Specter       A/P: Patient is a 40year-old woman with history of laparoscopic appendectomy, C-section x2, colectomy, laparoscopic hysterectomy, tubal ligation, right salpingo-oophorectomy, and incisional hernia repair s/p open lysis of adhesions and small bowel resection x2 for small bowel obstruction on 02/26/2021 now with early post-op small bowel obstruction.  -- Cont clears.  -- If not significantly improved, will need Upper GI w/ small bowel follow-through and described to pt what that involves and that the contrast alone can help stimulate the bowels to move; discussed that with adhesive bowel disease, if the contrast makes it into the colon within 24 hrs, it is likely that the bowels will be able to self-correct the obstruction without the need for surgery.  Pt understood and agreeable to the plan.      SBeverly Gust STobin Chad MD, FACS

## 2021-03-22 NOTE — Progress Notes (Signed)
CARE MANAGEMENT INITIAL ASSESSEMENT      NAME:   Alyssa [name]   DOB:     1980-11-10   MRN:     [id number]       Emergency Contact:  Extended Emergency Contact Information  Primary Emergency Contact: Alyssa [name], [place] Phone: [phone]  Work Phone: [phone]  Mobile Phone: [phone]  Relation: Mother  Secondary Emergency Contact: [name] [address] [place] Phone: [phone]  Mobile Phone: [phone]  Relation: Other Relative    Advance Directive:  Prior, does not have an advance directive.Marland Kitchen    Healthcare Decision Maker:   Alyssa [name]- mother- [phone]    Reason for Admission:  Alyssa [name] is a 40 y.o. 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      RUR:  16%  Risk Level:  Moderate  Value-based purchasing:  Yes  Bundle patient:  No    Residency:  Private residence  Exterior Steps:   2  Interior Steps:  None    Lives With:  Underage children    Prior functioning:  Independent.  Patient requires assistance with:  N/A    Prior DME required:  None    DME available:  None    Rehab history:  None    Discharge Concerns/Barriers Identified:  None identified      Insurer:  Payor: BLUE CROSS / Plan: [employer] ANTHEM BCBS [place] / Product Type: PPO /     PCP: Alyssa Rear, MD   Name of [name]:  Alyssa [name]   Current patient: Yes   Approximate date of last visit: 12/16/20   Access to virtual PCP visits:  Yes    Pharmacy:  [place]   Financial/Difficulty affording medications:  None identified    COVID-19 vaccination status:  Fully vaccinated. Pt/family unable to recall dates.    DC Transport:  Family      Transition of care plan:  Home with outpatient follow-up     Comments:   Pt admitted on 03/19/21 for SBO. CM completed initial assessment via phone. Pt lives at home with her children. Pt has no hx of HH, home O2, or equipment. Pt is independent with ADLs and ambulation. Pt denies problems with either. Pt is employed. Pt is able to drive.     Discharge plan is for Pt to return home. Family will transport Pt home.   _____________________________________  Alyssa [name], [place] Management  Available via [facility]  03/22/2021   10:57 AM      Care Management Interventions  PCP Verified by CM: Yes Alyssa Meals, MD)  Mode of Transport at Discharge: Other (see comment) (family)  Transition of Care Consult (CM Consult): Discharge Planning  MyChart Signup: No  Discharge Durable Medical Equipment: No  Physical Therapy Consult: No  Occupational Therapy Consult: No  Speech Therapy Consult: No  Support Systems: Child(ren)  Confirm Follow Up Transport: Self  Discharge Location  Patient Expects to be Discharged to:: Home

## 2021-03-22 NOTE — Progress Notes (Signed)
Problem: Pain  Goal: *Control of Pain  Outcome: Progressing Towards Goal     Problem: Impaired Skin Integrity/Pressure Injury Treatment  Goal: *Prevention of pressure injury  Description: Document [name] and appropriate interventions in the flowsheet.  Outcome: Progressing Towards Goal  Note: Pressure Injury Interventions:            Activity Interventions: Assess need for specialty bed, Increase time out of bed    Mobility Interventions: Assess need for specialty bed, HOB 30 degrees or less    Nutrition Interventions: Document food/fluid/supplement intake, Offer support with meals,snacks and hydration                     Problem: Falls - Risk of  Goal: *Absence of Falls  Description: Document Bridgette Habermann Fall Risk and appropriate interventions in the flowsheet.  Outcome: Progressing Towards Goal  Note: Fall Risk Interventions:            Medication Interventions: Teach patient to arise slowly    Elimination Interventions: Call light in reach, Patient to call for help with toileting needs, Toilet paper/wipes in reach

## 2021-03-22 NOTE — Progress Notes (Signed)
Bedside and Verbal shift change report given to Paula, RN (oncoming nurse) by Savannah, RN (offgoing nurse). Report included the following information SBAR, Kardex, Intake/Output, MAR, and Recent Results.

## 2021-03-22 NOTE — Progress Notes (Signed)
03/22/2021  12:01 PM  Update via chart review, pt is continuing to require medical management for SBO, surgery advancing diet.  Transitions of Care Plan:  RUR 16 % Moderate Risk of Readmission/Yellow  Pt is a Readmission to this hospital   LOS 3 Days   Medical management continues  Advancing diet  CM to follow through for treatment/response  DC when stable to home   Outpatient follow up PCP, Specialists  Family will transport at DC   CM will continue to follow and assist w/ DC needs  Alinda Dooms, BS   Case Manager

## 2021-03-23 ENCOUNTER — Inpatient Hospital Stay: Admit: 2021-03-23 | Payer: BLUE CROSS/BLUE SHIELD | Primary: Family Medicine

## 2021-03-23 LAB — POCT GLUCOSE
POC Glucose: 108 mg/dL (ref 65–117)
POC Glucose: 127 mg/dL — ABNORMAL HIGH (ref 65–117)
POC Glucose: 137 mg/dL — ABNORMAL HIGH (ref 65–117)
POC Glucose: 152 mg/dL — ABNORMAL HIGH (ref 65–117)
POC Glucose: 140 mg/dL — ABNORMAL HIGH (ref 65–117)

## 2021-03-23 LAB — PHOSPHORUS
Phosphorus: 4 MG/DL (ref 2.6–4.7)
Phosphorus: 4 MG/DL (ref 2.6–4.7)

## 2021-03-23 LAB — MAGNESIUM
Magnesium: 1.2 mg/dL — ABNORMAL LOW (ref 1.6–2.4)
Magnesium: 1.2 mg/dL — ABNORMAL LOW (ref 1.6–2.4)

## 2021-03-23 LAB — GLUCOSE, POC
Glucose (POC): 108 mg/dL (ref 65–117)
Glucose (POC): 127 mg/dL — ABNORMAL HIGH (ref 65–117)
Glucose (POC): 137 mg/dL — ABNORMAL HIGH (ref 65–117)
Glucose (POC): 152 mg/dL — ABNORMAL HIGH (ref 65–117)
Glucose (POC): 140 mg/dL — ABNORMAL HIGH (ref 65–117)

## 2021-03-23 MED ORDER — DIATRIZOATE MEGLUMINE & SODIUM 66 %-10 % ORAL SOLN
66-10 % | Freq: Once | ORAL | Status: AC
Start: 2021-03-23 — End: 2021-03-23
  Administered 2021-03-23: 15:00:00 via ORAL

## 2021-03-23 MED FILL — HYDRALAZINE 20 MG/ML IJ SOLN: 20 mg/mL | INTRAMUSCULAR | Qty: 1

## 2021-03-23 MED FILL — KETOROLAC TROMETHAMINE 30 MG/ML INJECTION: 30 mg/mL (1 mL) | INTRAMUSCULAR | Qty: 1

## 2021-03-23 MED FILL — ENOXAPARIN 30 MG/0.3 ML SUB-Q SYRINGE: 30 mg/0.3 mL | SUBCUTANEOUS | Qty: 0.3

## 2021-03-23 MED FILL — ACETAMINOPHEN 500 MG TAB: 500 mg | ORAL | Qty: 2

## 2021-03-23 MED FILL — PROCHLORPERAZINE EDISYLATE 5 MG/ML INJECTION: 5 mg/mL | INTRAMUSCULAR | Qty: 2

## 2021-03-23 MED FILL — HYDROMORPHONE (PF) 1 MG/ML IJ SOLN: 1 mg/mL | INTRAMUSCULAR | Qty: 1

## 2021-03-23 MED FILL — MD-GASTROVIEW 66 %-10 % ORAL SOLUTION: 66-10 % | ORAL | Qty: 120

## 2021-03-23 MED FILL — INSULIN LISPRO 100 UNIT/ML INJECTION: 100 unit/mL | SUBCUTANEOUS | Qty: 1

## 2021-03-23 MED FILL — ONDANSETRON (PF) 4 MG/2 ML INJECTION: 4 mg/2 mL | INTRAMUSCULAR | Qty: 2

## 2021-03-23 NOTE — Progress Notes (Signed)
0750  Radiology notified this RN that pt needs to be NPO for SBFT study today. Pt made aware and has not had anything to eat or drink yet this morning. Diet changed.    0900  Pt off the floor for testing.    1050  Pt returned to room. Radiology instructed this RN to keep pt NPO for xray scheduled for 1200. Pt informed and compliant.    1122  BP 176/111, PRN hydralazine administered.    1211  BP recheck 176/99. PRN Zofran administered - pt reports 2 episodes of emesis.    1240  BP recheck 132/83. Radiology informed this nurse that pt can have clears again for the time being - additional xray scheduled for 1800. Diet ordered.    1742  BP 170/97, PRN hydralazine administered.    1807  BP recheck 157/74

## 2021-03-23 NOTE — Progress Notes (Signed)
Bedside and Verbal shift change report given to Paula, RN (oncoming nurse) by Savannah, RN (offgoing nurse). Report included the following information SBAR, Kardex, Intake/Output, MAR, and Recent Results.

## 2021-03-23 NOTE — Progress Notes (Signed)
Bedside shift change report given to [facility] RN (oncoming nurse) by Bunnie Pion (offgoing nurse). Report included the following information SBAR, Kardex, Intake/Output, MAR, and Recent Results.

## 2021-03-23 NOTE — Progress Notes (Signed)
Responded to RRT at 1818 for Elevated Heart Rate    Provider at bedside: Other  Responded at 1844, surgery and hospitalist  Interventions ordered: EKG    Sepsis Suspected: no    Transfer to Higher Level of Care: no    Blood Glucose: 140    Visit Vitals  BP (!) 160/85 (BP 1 Location: Right upper arm)   Pulse (!) 138   Temp 98.8 F (37.1 C)   Resp 20   Ht 5\' 3"  (1.6 m)   Wt 109.9 kg (242 lb 3.2 oz)   SpO2 97%   BMI 42.90 kg/m      Rapid Response called for elevated heart rate. Pt hypertensive but given hydralazine prior to rapid. Pt alert and oriented, tearful, anxious and hyperventilating. Pt is redirectable with breathing using diversion exercises focusing on mother in room. Pt hr lowered to 118 but remains variable up to 140's. EKG reading a-tach. Dr Adline Mango given EKG for review. Pt denies chest pain, SOB reports abdominal pain is the same, denies nausea currently. Surgery and Hospitalist at bedside with mother. Primary nurse denies further need from RRT. Family encouraged to reach out to primary nurse or RRT for further concerns.    0130- Pt check, pt resting quietly in bed with eyes closed, reg resp pattern noted.  Helane Gunther, RN

## 2021-03-23 NOTE — Progress Notes (Signed)
Problem: Pain  Goal: *Control of Pain  Outcome: Progressing Towards Goal     Problem: Impaired Skin Integrity/Pressure Injury Treatment  Goal: *Prevention of pressure injury  Description: Document [name] and appropriate interventions in the flowsheet.  Outcome: Progressing Towards Goal  Note: Pressure Injury Interventions:            Activity Interventions: Assess need for specialty bed, Increase time out of bed    Mobility Interventions: Assess need for specialty bed, HOB 30 degrees or less    Nutrition Interventions: Document food/fluid/supplement intake, Offer support with meals,snacks and hydration    Friction and Shear Interventions: HOB 30 degrees or less, Lift sheet, Minimize layers                Problem: Nausea/Vomiting (Adult)  Goal: *Absence of nausea/vomiting  Outcome: Progressing Towards Goal  Goal: *Palliation of nausea/vomiting (Palliative Care)  Outcome: Progressing Towards Goal     Problem: Falls - Risk of  Goal: *Absence of Falls  Description: Document Schmid Fall Risk and appropriate interventions in the flowsheet.  Outcome: Progressing Towards Goal  Note: Fall Risk Interventions:            Medication Interventions: Teach patient to arise slowly    Elimination Interventions: Call light in reach, Patient to call for help with toileting needs, Toilet paper/wipes in reach

## 2021-03-23 NOTE — Progress Notes (Signed)
General Surgery Daily Progress Note    Patient: Alyssa [name] MRN: [id number]  SSN: [id number]    Date of Birth: March 24, 1981  Age: 40 y.o.  Sex: female      Admit Date: 03/19/2021    POD * No surgery found *    Procedure: * No surgery found *    Subjective:   RRT called late afternoon.  Pt reports pain on abdomen and HR was 140s  She reports pain is unchanged since surgery on 02/26/2021.  She then reports pain does improve and then worsen again  She has had diarrhea  SBFT done which showed contrast in stomach for over 1 hrs.  Film later at 6pm shows contrast in colon without sign of obstruction    Current Facility-Administered Medications   Medication Dose Route Frequency    famotidine (PF) (PEPCID) 20 mg in 0.9% sodium chloride 10 mL injection  20 mg IntraVENous Q12H    polyethylene glycol (MIRALAX) packet 17 g  17 g Oral DAILY PRN    insulin lispro (HUMALOG) injection   SubCUTAneous AC&HS    prochlorperazine (COMPAZINE) injection 10 mg  10 mg IntraVENous Q6H PRN    ketorolac (TORADOL) injection 30 mg  30 mg IntraVENous Q6H    acetaminophen (TYLENOL) tablet 1,000 mg  1,000 mg Oral Q6H    HYDROmorphone (DILAUDID) injection 1 mg  1 mg IntraVENous Q3H PRN    lactated Ringers infusion  75 mL/hr IntraVENous CONTINUOUS    ondansetron (ZOFRAN) injection 4 mg  4 mg IntraVENous Q6H PRN    glucose chewable tablet 16 g  4 Tablet Oral PRN    glucagon (GLUCAGEN) injection 1 mg  1 mg IntraMUSCular PRN    dextrose 10% infusion 0-250 mL  0-250 mL IntraVENous PRN    enoxaparin (LOVENOX) injection 30 mg  30 mg SubCUTAneous Q12H    hydrALAZINE (APRESOLINE) 20 mg/mL injection 20 mg  20 mg IntraVENous Q6H PRN        Objective:   No intake/output data recorded.  12/05 0701 - 12/06 1900  In: 4540.8 [P.O.:740; I.V.:3800.8]  Out: 0   Patient Vitals for the past 8 hrs:   BP Temp Pulse Resp SpO2   03/23/21 1834 133/76 -- (!) 130 25 98 %   03/23/21 1825 (!) 175/83 -- (!) 137 25 100 %   03/23/21 1807 (!) 157/74 -- -- -- --   03/23/21 1741 (!)  170/97 -- -- -- --   03/23/21 1711 (!) 169/94 -- (!) 115 -- --   03/23/21 1627 (!) 177/99 -- (!) 117 -- --   03/23/21 1605 (!) 178/99 98.4 ??F (36.9 ??C) (!) 120 20 97 %   03/23/21 1239 132/83 -- -- -- --   03/23/21 1211 (!) 176/99 -- -- -- --   03/23/21 1122 (!) 176/111 98.5 ??F (36.9 ??C) 84 18 96 %       Physical Exam:  General: Alert, cooperative, NAD  Lungs: Unlabored  Abdomen: Soft, TTP epigastric and Right side abdomen, no guarding, no peritoneal signs  Extremities: Warm, moves all, no edema  Skin:  Warm and dry, no rash    Labs:   Recent Labs     03/22/21  0514   WBC 5.4   HGB 9.9*   HCT 31.9*   PLT 463*     Recent Labs     03/23/21  0208 03/22/21  0514   NA  --  140   K  --  3.3*   CL  --  104   CO2  --  28   GLU  --  142*   BUN  --  4*   CREA  --  0.53*   CA  --  8.9   MG 1.2* 1.4*   PHOS 4.0 3.9       Assessment / Plan:     POD * No surgery found *    Procedure: * No surgery found *  Recommend hospitalist consult to help with heart rate which might be driven by pain and anxiety  No indication for surgical intervention emergently  Explained possibility of gastroparesis vs IBS based on the long history pt described which started before surgery        Edwena Bunde, MD

## 2021-03-23 NOTE — Progress Notes (Signed)
1815  RRT called for sustained HR in 140-150's range. RRT RN at bedside, EKG performed. HR 140, BS 140.     1830  Paged on call MD at [facility].     1840  No MD available at bedside during RRT. Spoke to on call surgical MD, Dr. Kalman Shan, who told this RN to consult hospitalist for management of HTN and tachycardia.    [place]  Dr. Evelena Asa at bedside.    [place]  Dr. Chesley Mires at bedside.

## 2021-03-23 NOTE — Progress Notes (Signed)
03/23/2021  11:59 AM  Update via  chart review, pt is continuing to require medical management for SBO, SBFT today   Transitions of Care Plan:  RUR 16 % Moderate Risk of Readmission/Yellow  Pt is a Readmission to this hospital   LOS 4 Days   Medical management continues  SBFT today   Dietary following  CM to follow through for treatment/response  DC when stable to home   Outpatient follow up PCP, specialists   Family will transport at DC   CM will continue to follow and assist w/ DC needs  Jacinta Shoe, BS   Case Manager

## 2021-03-24 ENCOUNTER — Inpatient Hospital Stay: Admit: 2021-03-24 | Payer: BLUE CROSS/BLUE SHIELD | Primary: Family Medicine

## 2021-03-24 LAB — POCT GLUCOSE
POC Glucose: 149 mg/dL — ABNORMAL HIGH (ref 65–117)
POC Glucose: 135 mg/dL — ABNORMAL HIGH (ref 65–117)
POC Glucose: 147 mg/dL — ABNORMAL HIGH (ref 65–117)
POC Glucose: 153 mg/dL — ABNORMAL HIGH (ref 65–117)

## 2021-03-24 LAB — MAGNESIUM
Magnesium: 1.4 mg/dL — ABNORMAL LOW (ref 1.6–2.4)
Magnesium: 1.4 mg/dL — ABNORMAL LOW (ref 1.6–2.4)

## 2021-03-24 LAB — PHOSPHORUS
Phosphorus: 4.4 MG/DL (ref 2.6–4.7)
Phosphorus: 4.4 MG/DL (ref 2.6–4.7)

## 2021-03-24 LAB — GLUCOSE, POC
Glucose (POC): 149 mg/dL — ABNORMAL HIGH (ref 65–117)
Glucose (POC): 135 mg/dL — ABNORMAL HIGH (ref 65–117)
Glucose (POC): 147 mg/dL — ABNORMAL HIGH (ref 65–117)
Glucose (POC): 153 mg/dL — ABNORMAL HIGH (ref 65–117)

## 2021-03-24 MED ORDER — METOPROLOL TARTRATE 5 MG/5 ML IV SOLN
5 mg/ mL | Freq: Four times a day (QID) | INTRAVENOUS | Status: AC | PRN
Start: 2021-03-24 — End: 2021-03-26

## 2021-03-24 MED ORDER — FAMOTIDINE (PF) 20 MG/2 ML IV
202 mg/2 mL | Freq: Two times a day (BID) | INTRAVENOUS | Status: AC
Start: 2021-03-24 — End: 2021-03-26
  Administered 2021-03-24 – 2021-03-26 (×6): via INTRAVENOUS

## 2021-03-24 MED ORDER — LISINOPRIL 20 MG TAB
20 mg | Freq: Every day | ORAL | Status: AC
Start: 2021-03-24 — End: 2021-03-26
  Administered 2021-03-24 – 2021-03-26 (×3): via ORAL

## 2021-03-24 MED ORDER — SENNOSIDES-DOCUSATE SODIUM 8.6 MG-50 MG TAB
8.6-50 mg | Freq: Every day | ORAL | Status: DC
Start: 2021-03-24 — End: 2021-03-26
  Administered 2021-03-24 – 2021-03-26 (×3): via ORAL

## 2021-03-24 MED FILL — ENOXAPARIN 30 MG/0.3 ML SUB-Q SYRINGE: 30 mg/0.3 mL | SUBCUTANEOUS | Qty: 0.3

## 2021-03-24 MED FILL — KETOROLAC TROMETHAMINE 30 MG/ML INJECTION: 30 mg/mL (1 mL) | INTRAMUSCULAR | Qty: 1

## 2021-03-24 MED FILL — ACETAMINOPHEN 500 MG TAB: 500 mg | ORAL | Qty: 2

## 2021-03-24 MED FILL — HYDROMORPHONE (PF) 1 MG/ML IJ SOLN: 1 mg/mL | INTRAMUSCULAR | Qty: 1

## 2021-03-24 MED FILL — HYDRALAZINE 20 MG/ML IJ SOLN: 20 mg/mL | INTRAMUSCULAR | Qty: 1

## 2021-03-24 MED FILL — FAMOTIDINE (PF) 20 MG/2 ML IV: 20 mg/2 mL | INTRAVENOUS | Qty: 2

## 2021-03-24 MED FILL — PROCHLORPERAZINE EDISYLATE 5 MG/ML INJECTION: 5 mg/mL | INTRAMUSCULAR | Qty: 2

## 2021-03-24 MED FILL — ONDANSETRON (PF) 4 MG/2 ML INJECTION: 4 mg/2 mL | INTRAMUSCULAR | Qty: 2

## 2021-03-24 MED FILL — INSULIN LISPRO 100 UNIT/ML INJECTION: 100 unit/mL | SUBCUTANEOUS | Qty: 1

## 2021-03-24 MED FILL — SENNOSIDES-DOCUSATE SODIUM 8.6 MG-50 MG TAB: ORAL | Qty: 1

## 2021-03-24 MED FILL — LISINOPRIL 20 MG TAB: 20 mg | ORAL | Qty: 1

## 2021-03-24 NOTE — Progress Notes (Signed)
 1315: Responded to RRT for SOB and Elevated Heart Rate  Provider at bedside: no  Interventions ordered: EKG  Sepsis Suspected: no  Transfer to Higher Level of Care: no    Patient recently given hydralazine for hypertension, shortly after complaining of SOB and elevated HR. Patient had similar episode yesterday.EKG shows Sinus Tach. Dr. Janett paged and aware. Placed on remote tele.     Visit Vitals  BP (!) 165/99 (BP 1 Location: Left upper arm, BP Patient Position: At rest)   Pulse (!) 135   Temp 98.2 F (36.8 C)   Resp 24   Ht 5' 3 (1.6 m)   Wt 109.9 kg (242 lb 3.2 oz)   SpO2 100%   BMI 42.90 kg/m        Comer GORMAN Able RN

## 2021-03-24 NOTE — Progress Notes (Signed)
Spiritual Care Assessment/Progress Note  [place]      NAME: Alyssa [name]      MRN: [id number]  AGE: 40 y.o. SEX: female  Religious Affiliation: Darrick Meigs   Language: English     03/24/2021     Total Time (in minutes): 20     Spiritual Assessment begun in [facility]  MED SURG 2 through conversation with:         [] Patient        []  Family    []  Friend(s)        Reason for Consult: Rapid response team     Spiritual beliefs: (Please include comment if needed)     []  Identifies with a faith tradition:         []  Supported by a faith community:            []  Claims no spiritual orientation:           []  Seeking spiritual identity:                []  Adheres to an individual form of spirituality:           [x]  Not able to assess:                           Identified resources for coping:      []  Prayer                               []  Music                  []  Guided Imagery     []  Family/friends                 []  Pet visits     []  Devotional reading                         [x]  Unknown     []  Other:                                               Interventions offered during this visit: (See comments for more details)                Plan of Care:     []  Support spiritual and/or cultural needs    []  Support AMD and/or advance care planning process      []  Support grieving process   []  Coordinate Rites and/or Rituals    []  Coordination with community clergy   []  No spiritual needs identified at this time   []  Detailed Plan of Care below (See Comments)  []  Make referral to Music Therapy  []  Make referral to Pet Therapy     []  Make referral to Addiction services  []  Make referral to [place]  []  Make referral to Spiritual Care Partner  []  No future visits requested        []  Contact Spiritual Care for further referrals     Comments: Pager Alert Response: No family present. Chart reviewed. Provided ministry of presence and collaborated with and supported staff. Please contact Spiritual Care for any further  referrals.        Visited by: Bonney Roussel  [place]. Alm [place], [place] paging Service [phone])

## 2021-03-24 NOTE — Progress Notes (Signed)
Bedside and Verbal shift change report given to debbie (oncoming nurse) by jean (offgoing nurse). Report included the following information SBAR, Kardex, Procedure Summary, Intake/Output, and MAR.

## 2021-03-24 NOTE — Progress Notes (Signed)
I have only seen this patient once.  I do not regularly follow this patient      Physician Progress Note      PATIENTCOPPER, KIRTLEY  CSN #:                  [id number]  DOB:                       1981-02-11  ADMIT DATE:       03/19/2021 1:53 PM  [place] DATE:  RESPONDING  PROVIDER #:        Edwena Bunde MD          QUERY TEXT:    Patient admitted with BMI 42.9. If possible, please document in progress notes and discharge summary if you are evaluating and /or treating any of the following:    The medical record reflects the following:  Risk Factors: DM, HTN  Clinical Indicators: pt admitted with post-op SBP  - noted BMI: 42.9 with Ht: 5\' 3"  and Wt: 109.9 kg  Treatment: monitoring    ?Specificity of obesity and morbid obesity should be reported based on physician documentation, as there are several published classifications and definitions? 75M MS-DRG Training Guide. CDC: Firmchair.hu. WHO: KeywordPortfolios.com.br. NIH: https://www.chang-huffman.com/    Thank you,    Maylon Peppers, RN  CDMP  Options provided:  -- Obesity  -- Morbid obesity  -- Severe obesity  -- Overweight  -- BMI not clinically significant  -- Other - I will add my own diagnosis  -- Disagree - Not applicable / Not valid  -- Disagree - Clinically unable to determine / Unknown  -- Refer to Clinical Documentation Reviewer    PROVIDER RESPONSE TEXT:    This patient has morbid obesity.    Query created by: Maylon Peppers on 03/22/2021 1:23 PM      Electronically signed by:  Edwena Bunde MD 03/24/2021 3:36 PM

## 2021-03-24 NOTE — Progress Notes (Signed)
The patient called to the nurses station stating that she needed the nurse that her heart was beating really fast.  When I got into the room her pulse rate was 147 and she was short of breath, I calmed the patient down and placed her on 4L of oxygen, took her bp the went to get advise from [facility].  We called a rapid on her. EKG was done, and Dr Hervey Ard was called.    42 spoke with Dr Hervey Ard she stated that Telementry was ok, that she would move her medications around and order a beta blocker and a chest xray.
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
## 2021-03-24 NOTE — Progress Notes (Signed)
General Surgery    Patient is a 40 year-old woman with history of laparoscopic appendectomy, C-section x2, colectomy, laparoscopic hysterectomy, tubal ligation, right salpingo-oophorectomy, and incisional hernia repair s/p open lysis of adhesions and small bowel resection x2 for small bowel obstruction on 02/26/2021 who presented to the [facility] ER with abdominal pain and nausea/vomiting.  CT abdomen/pelvis demonstrated dilated loops of ileum with transition point in the LLQ and the RLQ, distal to the anastomosis (per my read).    HPI: Patient states her pain is similar.  Had an episode of nausea/vomiting when the RRT was called last night; since tolerated some full liquids but feels full quickly and that it's just sitting in the stomach.  No flatus/no BM.  No family currently at bedside.    No current facility-administered medications on file prior to encounter.     Current Outpatient Medications on File Prior to Encounter   Medication Sig Dispense Refill    ondansetron (ZOFRAN ODT) 4 mg disintegrating tablet Take 1 Tablet by mouth every eight (8) hours as needed for Nausea or Vomiting. (Patient not taking: Reported on 03/10/2021) 30 Tablet 0    gabapentin (NEURONTIN) 300 mg capsule Take 1 Capsule by mouth nightly for 15 days. Max Daily Amount: 300 mg. (Patient not taking: Reported on 03/19/2021) 15 Capsule 0    metFORMIN ER (GLUCOPHAGE XR) 500 mg tablet Take 4 Tablets by mouth daily (with dinner). 360 Tablet 3    lisinopriL (PRINIVIL, ZESTRIL) 20 mg tablet Take 1 Tablet by mouth daily. 30 Tablet 1    flash glucose sensor (FreeStyle Libre 2 Sensor) kit Change sensor every 14 days 6 Kit 3    insulin glargine U-300 conc (Toujeo Max U-300 SoloStar) 300 unit/mL (3 mL) inpn 80 Units by SubCUTAneous route nightly. Indications: type 2 diabetes mellitus 24 mL 3    methocarbamoL (ROBAXIN) 750 mg tablet Take 1 Tablet by mouth three (3) times daily as needed for Muscle Spasm(s). (Patient not taking: No sig reported) 60  Tablet 0    ondansetron (ZOFRAN ODT) 4 mg disintegrating tablet Take 1 Tablet by mouth every eight (8) hours as needed for Nausea or Vomiting. 12 Tablet 0    rosuvastatin (CRESTOR) 10 mg tablet Take 1 Tablet by mouth nightly. 90 Tablet 3    glucose blood VI test strips (Prodigy No Coding) strip Use to test blood sugars 4 times daily or as directed 400 Strip 3    valACYclovir (VALTREX) 500 mg tablet Take 1 Tablet by mouth two (2) times a day. 180 Tablet 0    omega 3-DHA-EPA-fish oil 1,000 mg (120 mg-180 mg) capsule Take 1 Capsule by mouth daily.      Insulin Needles, Disposable, (Comfort EZ Pen Needles) 31 gauge x 5/16" ndle Leader brand pen needle to use once a day with basal insulin 100 Package 3    lancets misc Use to test blood sugars once a day as directed 100 Each 3       Patient Vitals for the past 24 hrs:   Temp Pulse Resp BP SpO2   03/24/21 2035 98.3 ??F (36.8 ??C) 99 18 (!) 164/97 94 %   03/24/21 1624 98.1 ??F (36.7 ??C) (!) 109 16 129/75 93 %   03/24/21 1556 -- (!) 117 -- (!) 152/88 --   03/24/21 1500 -- (!) 106 -- -- --   03/24/21 1314 98.2 ??F (36.8 ??C) (!) 135 24 (!) 165/99 100 %   03/24/21 1245 -- -- -- (!) 177/109 --  03/24/21 1211 98.4 ??F (36.9 ??C) 95 16 (!) 156/109 96 %   03/24/21 0757 97.9 ??F (36.6 ??C) 97 17 136/76 98 %   03/24/21 0407 99.1 ??F (37.3 ??C) 91 19 135/73 100 %   03/23/21 2309 98 ??F (36.7 ??C) (!) 102 18 (!) 158/89 97 %     Date 03/23/21 1900 - 03/24/21 0659 03/24/21 0700 - 03/25/21 0659   Shift 1900-0659 24 Hour Total 0700-1859 1900-0659 24 Hour Total   INTAKE   P.O. 100 100        P.O. 100 100      I.V.(mL/kg/hr) 1035 2181.7 327.5(0.2)  327.5     I.V.  0        Volume (lactated Ringers infusion) 1035 2181.7 327.5  327.5     Volume (dextrose 10% infusion 0-250 mL)  0 0  0   Blood  0        Autotransfused  0      Other  0        Other  0      Shift Total(mL/kg) 1135(10.3) 2281.7(20.8) 327.5(3)  327.5(3)   OUTPUT   Urine(mL/kg/hr)  0        Urine Voided  0        Urine Occurrence(s) 3 x 4 x 1  x  1 x   Emesis/NG output  0 500  500     Emesis  0 500  500     Emesis Occurrence(s)  2 x      Other  0        Other Output  0      Stool  0        Stool Occurrence(s)  0 x        Stool  0      Blood  0        Quantitative Blood Loss  0        Blood  0      Shift Total(mL/kg)  0(0) 500(4.6)  500(4.6)   NET 1135 2281.7 -172.5  -172.5   Weight (kg) 109.9 109.9 109.9 109.9 109.9     GEN: no acute distress  ABD: morbidly obese; soft; (+) mild RLQ/peri-incisional tenderness; non-distended; healed midline and port site scars      Recent Results (from the past 24 hour(s))   GLUCOSE, POC    Collection Time: 03/23/21  9:19 PM   Result Value Ref Range    Glucose (POC) 149 (H) 65 - 117 mg/dL    Performed by Caren Macadam (PCT)    MAGNESIUM    Collection Time: 03/24/21  1:32 AM   Result Value Ref Range    Magnesium 1.4 (L) 1.6 - 2.4 mg/dL   PHOSPHORUS    Collection Time: 03/24/21  1:32 AM   Result Value Ref Range    Phosphorus 4.4 2.6 - 4.7 MG/DL   GLUCOSE, POC    Collection Time: 03/24/21  7:53 AM   Result Value Ref Range    Glucose (POC) 135 (H) 65 - 117 mg/dL    Performed by Ileana Roup    GLUCOSE, POC    Collection Time: 03/24/21 11:33 AM   Result Value Ref Range    Glucose (POC) 147 (H) 65 - 117 mg/dL    Performed by Ileana Roup    EKG, 12 LEAD, INITIAL    Collection Time: 03/24/21  1:19 PM   Result Value Ref Range    Ventricular Rate 132 BPM  Atrial Rate 132 BPM    P-R Interval 96 ms    QRS Duration 82 ms    Q-T Interval 394 ms    QTC Calculation (Bezet) 583 ms    Calculated R Axis 93 degrees    Calculated T Axis 9 degrees    Diagnosis       Critical Test Result: Long QTc  Sinus tachycardia with short PR  Rightward axis  Possible Anterior infarct , age undetermined  Abnormal ECG  When compared with ECG of 23-Mar-2021 18:21,  MANUAL COMPARISON REQUIRED, DATA IS UNCONFIRMED     GLUCOSE, POC    Collection Time: 03/24/21  5:35 PM   Result Value Ref Range    Glucose (POC) 153 (H) 65 - 117 mg/dL    Performed by  Dominga Ferry    GLUCOSE, POC    Collection Time: 03/24/21  9:11 PM   Result Value Ref Range    Glucose (POC) 122 (H) 65 - 117 mg/dL    Performed by Nelson Chimes        A/P: Patient is a 40 year-old woman with history of laparoscopic appendectomy, C-section x2, colectomy, laparoscopic hysterectomy, tubal ligation, right salpingo-oophorectomy, and incisional hernia repair s/p open lysis of adhesions and small bowel resection x2 for small bowel obstruction on 02/26/2021 now with early post-op small bowel obstruction and likely diabetic gastroparesis.  -- Cont full liquids.  Likely advance tomorrow.  -- Senna/Colace for BM since Miralax caused her diarrhea the other day.  -- Discussed w/ pt that I agree w/ Dr Encarnacion Slates assessment based on the Upper GI that she likely has diabetic gastroparesis since the contrast stayed in her stomach for some time before moving through and that this is why she feels full; discussed that the contrast made it all the way into the rectum and that her small bowel obstruction appears to have resolved.  Also agree with Dr Chesley Mires re: re-establishment of care w/ outpt GI, who she hasn't seen in a few yrs.      Beverly Gust. Tobin Chad, MD, FACS        Addendum  Saw pt after RRT called.      She feels much better now.  She denies any prior panic attacks.  Discussed w/ RN that she felt the same way after the hydralazine was given.      GEN: no acute distress  CV: regular rate and rhythm (HR '@80' )  RESP: clear to auscultation   ABD: morbidly obese; soft; (+) mild RLQ/peri-incisional tenderness; non-distended; healed midline and port site scars    CXR Results  (Last 48 hours)                 03/24/21 1400  XR CHEST PORT Final result    Impression:      No acute findings.           Narrative:  EXAM:  XR CHEST PORT       INDICATION: Shortness of breath       COMPARISON: Chest radiograph 01/16/2020       TECHNIQUE: Semiupright portable chest AP view       FINDINGS:        Cardiac mediastinal silhouette  is upper limits of normal in size. Lungs and   pleural spaces are grossly clear.                 Will change BP meds from hydralazine to metoprolol for PRN needs to eval if her symptoms  were a reaction to the hydralazine since the timing correlated twice.  Added home Lisinopril this AM.      Beverly Gust. Tobin Chad, MD, FACS

## 2021-03-24 NOTE — Progress Notes (Signed)
Patient was in need of hydralazine due to elevated blood pressure of 177/109. The primary nurse cannot push hydralazine and asked if able to do it. Hydralazine given by Newton Pigg, RN.

## 2021-03-24 NOTE — Progress Notes (Signed)
03/24/2021  1:44 PM  Update via  chart review, pt is continuing to require medical management for SBO, SBFT today   Transitions of Care Plan:  RUR 16 % Moderate Risk of Readmission/Yellow  Pt is a Readmission to this hospital   LOS 5  Days   Medical management continues  S/P SBFT 12/6   RRT for abdominal pain and rapid HR  Dietary following  CM to follow through for treatment/response  DC when stable to home   Outpatient follow up PCP, specialists   Family will transport at DC   CM will continue to follow and assist w/ DC needs  Jacinta Shoe, BS   Case Manager

## 2021-03-24 NOTE — Progress Notes (Signed)
Bedside shift change report given to Dorothy LPN (oncoming nurse) by Paula RN (offgoing nurse). Report included the following information SBAR, Kardex, Intake/Output, and MAR.

## 2021-03-25 ENCOUNTER — Inpatient Hospital Stay: Admit: 2021-03-25 | Payer: BLUE CROSS/BLUE SHIELD | Primary: Family Medicine

## 2021-03-25 ENCOUNTER — Inpatient Hospital Stay: Payer: BLUE CROSS/BLUE SHIELD | Primary: Family Medicine

## 2021-03-25 ENCOUNTER — Inpatient Hospital Stay: Attending: Surgery | Primary: Family Medicine

## 2021-03-25 LAB — BASIC METABOLIC PANEL
Anion Gap: 7 mmol/L (ref 5–15)
BUN: 6 MG/DL (ref 6–20)
Bun/Cre Ratio: 10 — ABNORMAL LOW (ref 12–20)
CO2: 28 mmol/L (ref 21–32)
Calcium: 9 MG/DL (ref 8.5–10.1)
Chloride: 102 mmol/L (ref 97–108)
Creatinine: 0.62 MG/DL (ref 0.55–1.02)
ESTIMATED GLOMERULAR FILTRATION RATE: >60 mL/min/{1.73_m2} (ref >60)
Glucose: 136 mg/dL — ABNORMAL HIGH (ref 65–100)
Potassium: 3 mmol/L — ABNORMAL LOW (ref 3.5–5.1)
Sodium: 137 mmol/L (ref 136–145)

## 2021-03-25 LAB — EKG 12-LEAD
Q-T Interval: 380 ms
QRS Duration: 78 ms
QTc Calculation (Bazett): 563 ms
R Axis: 91 degrees
T Axis: 18 degrees
Ventricular Rate: 132 {beats}/min

## 2021-03-25 LAB — POCT GLUCOSE
POC Glucose: 122 mg/dL — ABNORMAL HIGH (ref 65–117)
POC Glucose: 125 mg/dL — ABNORMAL HIGH (ref 65–117)
POC Glucose: 168 mg/dL — ABNORMAL HIGH (ref 65–117)
POC Glucose: 163 mg/dL — ABNORMAL HIGH (ref 65–117)

## 2021-03-25 LAB — PHOSPHORUS
Phosphorus: 4.2 MG/DL (ref 2.6–4.7)
Phosphorus: 4.2 MG/DL (ref 2.6–4.7)

## 2021-03-25 LAB — MAGNESIUM
Magnesium: 1.4 mg/dL — ABNORMAL LOW (ref 1.6–2.4)
Magnesium: 1.4 mg/dL — ABNORMAL LOW (ref 1.6–2.4)

## 2021-03-25 LAB — METABOLIC PANEL, BASIC
Anion gap: 7 mmol/L (ref 5–15)
BUN/Creatinine ratio: 10 — ABNORMAL LOW (ref 12–20)
BUN: 6 MG/DL (ref 6–20)
CO2: 28 mmol/L (ref 21–32)
Calcium: 9 MG/DL (ref 8.5–10.1)
Chloride: 102 mmol/L (ref 97–108)
Creatinine: 0.62 MG/DL (ref 0.55–1.02)
Glucose: 136 mg/dL — ABNORMAL HIGH (ref 65–100)
Potassium: 3 mmol/L — ABNORMAL LOW (ref 3.5–5.1)
Sodium: 137 mmol/L (ref 136–145)
eGFR: >60 mL/min/{1.73_m2} (ref >60)

## 2021-03-25 LAB — GLUCOSE, POC
Glucose (POC): 122 mg/dL — ABNORMAL HIGH (ref 65–117)
Glucose (POC): 125 mg/dL — ABNORMAL HIGH (ref 65–117)
Glucose (POC): 168 mg/dL — ABNORMAL HIGH (ref 65–117)
Glucose (POC): 163 mg/dL — ABNORMAL HIGH (ref 65–117)

## 2021-03-25 LAB — EKG, 12 LEAD, INITIAL
Calculated R Axis: 91 degrees
Calculated T Axis: 18 degrees
Q-T Interval: 380 ms
QRS Duration: 78 ms
QTC Calculation (Bezet): 563 ms
Ventricular Rate: 132 {beats}/min

## 2021-03-25 MED ORDER — POTASSIUM CHLORIDE 10 MEQ/100 ML IV PIGGY BACK
10 mEq/0 mL | INTRAVENOUS | Status: AC
Start: 2021-03-25 — End: 2021-03-25

## 2021-03-25 MED ORDER — PROCHLORPERAZINE MALEATE 5 MG TAB
5 mg | Freq: Four times a day (QID) | ORAL | Status: AC
Start: 2021-03-25 — End: 2021-03-26
  Administered 2021-03-25 – 2021-03-26 (×4): via ORAL

## 2021-03-25 MED ORDER — TECHNETIUM TC-99M SULFUR COLLOID (SODIUM THIOSULFATE) 2MG SOLUTION
2 mg | Freq: Once | Status: AC
Start: 2021-03-25 — End: 2021-03-25
  Administered 2021-03-25: 19:00:00 via ORAL

## 2021-03-25 MED ORDER — SODIUM CHLORIDE 0.9% BOLUS IV
0.9 % | Freq: Once | INTRAVENOUS | Status: AC
Start: 2021-03-25 — End: 2021-03-25

## 2021-03-25 MED ORDER — HYDROMORPHONE 2 MG TAB
2 mg | ORAL | Status: DC | PRN
Start: 2021-03-25 — End: 2021-03-26
  Administered 2021-03-26 (×3): via ORAL

## 2021-03-25 MED FILL — HYDROMORPHONE (PF) 1 MG/ML IJ SOLN: 1 mg/mL | INTRAMUSCULAR | Qty: 1

## 2021-03-25 MED FILL — PROCHLORPERAZINE EDISYLATE 5 MG/ML INJECTION: 5 mg/mL | INTRAMUSCULAR | Qty: 2

## 2021-03-25 MED FILL — PROCHLORPERAZINE MALEATE 5 MG TAB: 5 mg | ORAL | Qty: 1

## 2021-03-25 MED FILL — ENOXAPARIN 30 MG/0.3 ML SUB-Q SYRINGE: 30 mg/0.3 mL | SUBCUTANEOUS | Qty: 0.3

## 2021-03-25 MED FILL — FAMOTIDINE (PF) 20 MG/2 ML IV: 20 mg/2 mL | INTRAVENOUS | Qty: 2

## 2021-03-25 MED FILL — INSULIN LISPRO 100 UNIT/ML INJECTION: 100 unit/mL | SUBCUTANEOUS | Qty: 1

## 2021-03-25 MED FILL — SENNOSIDES-DOCUSATE SODIUM 8.6 MG-50 MG TAB: ORAL | Qty: 1

## 2021-03-25 MED FILL — SODIUM CHLORIDE 0.9 % IV: INTRAVENOUS | Qty: 500

## 2021-03-25 MED FILL — POTASSIUM CHLORIDE 10 MEQ/100 ML IV PIGGY BACK: 10 mEq/0 mL | INTRAVENOUS | Qty: 100

## 2021-03-25 MED FILL — ACETAMINOPHEN 500 MG TAB: 500 mg | ORAL | Qty: 2

## 2021-03-25 MED FILL — LISINOPRIL 20 MG TAB: 20 mg | ORAL | Qty: 1

## 2021-03-25 NOTE — Progress Notes (Signed)
Ultrasound IV by Kern Alberta, RN :  Procedure Note    Patient meets criteria for [place] IV insertion.     Ultrasound IV education provided to patient. Opportunities for questions given.      Ultrasound used for PIV placement:  22gauge 6 cm Arrow Endurance Extended Dwell(may stay in place for 29 days)  R Forearm location.  2 X Attempt(s).    Flushed with ease; vigorous blood return.     Procedure tolerated well. Primary RN aware of IV placement and added to LDA.      Kern Alberta, RN

## 2021-03-25 NOTE — Progress Notes (Signed)
 Progress Notes  by Horald Krone at 03/25/21 1239                Author: Horald Krone  Service: Registered Dietitian or Nutrition Professional  Author Type: Dietetic Student       Filed: 03/25/21 1332  Date of Service: 03/25/21 1239  Status: Attested Addendum          Editor: Horald Krone (Dietetic Student)       Related Notes: Original Note by Horald Krone (Dietetic Student) filed at 03/25/21 1309          Cosigner: Perley Bergeron, RD at 03/25/21 1349          Attestation signed by Perley Bergeron, RD at 03/25/21 1349          RD Preceptor present for Dietetic Intern's assessment and evaluation of this patient. I discussed the findings, assessment and plan with the Dietetic Intern  and agree with their findings and plan as documented in their note.      Bergeron Perley, RD, MS   Contact via [employer] or office [phone]                                 Comprehensive Nutrition Assessment      Type and Reason for Visit: Reassess, Consult        Nutrition Recommendations/Plan:    1.  Advance to full liquid, NCS diet as able - eventual goal GI bland, 4 carb choice, low fiber.   2.  Provide Gelatein once daily to increase kcal/protein intake (80 kcal, <1 g carbs, 20 g protein)     3.  Obtain newly measured bed scale weight.        Malnutrition Assessment:   Malnutrition Status:  Mild malnutrition (03/25/21 1259)     Context:  Acute illness      Findings of the 6 clinical characteristics of malnutrition:    Energy Intake:  50% or less of est energy requirements for 5 or more days   Weight Loss:  Unable to assess      Body Fat Loss:  Unable to assess,      Muscle Mass Loss:  Unable to assess,     Fluid Accumulation:  No significant fluid accumulation,     Grip Strength:  Not performed       Nutrition Assessment:      12/8: Pt NPO today for gastric emptying study later today. Pt reported appetite was okay. Stated she eats half a meal or less then gets nauseous  and can't finish her meal. Pt also c/o vomiting and  diarrhea. RD consulted for diet education. Intern educated patient on eating a low fiber, low fat diet if GI procedure does show she has gastroparesis. Education materials given to pt. Pt voiced understanding.  Last BM was yesterday. No further complaints at this time. Noted K+ slightly low - being repleted. Weight not specified - obtain newly measured bed scale or standing scale weight.      Documented Meal intake:   Patient Vitals for the past 168 hrs:        % Diet Eaten        03/24/21 0810  26 - 50%        03/23/21 0735  0%        03/22/21 0805  26 - 50%           Documentation of supplement  intake:   Patient Vitals for the past 168 hrs:        Supplement intake %        03/23/21 0735  0%        03/22/21 0805  0%        03/22/21 0346  0%           Nutrition Related Findings:       Wound Type: Surgical incision (abdomen)   Last Bowel Movement Date: 03/24/21   Stool Appearance: Loose   Abdominal Assessment: Nausea, Obese   Appetite: Fair   Bowel Sounds: Active   Edema:No data recorded     Nutr. Labs:     Lab Results         Component  Value  Date/Time            GFR est AA  >60  08/15/2020 09:21 AM       GFR est non-AA  >60  08/15/2020 09:21 AM       Creatinine  0.62  03/25/2021 01:39 AM       BUN  6  03/25/2021 01:39 AM       Sodium  137  03/25/2021 01:39 AM       Potassium  3.0 (L)  03/25/2021 01:39 AM       Chloride  102  03/25/2021 01:39 AM            CO2  28  03/25/2021 01:39 AM             Lab Results         Component  Value  Date/Time            Glucose  136 (H)  03/25/2021 01:39 AM            Glucose (POC)  168 (H)  03/25/2021 10:58 AM             Lab Results         Component  Value  Date/Time            Hemoglobin A1c  8.0 (H)  03/20/2021 01:30 AM            Hemoglobin A1c (POC)  9.4  01/21/2021 12:11 PM          Nutr. Meds:   Lovenox , pepcid , glucagon  PRN, lispro, lisinopril , lopressor , zofran  PRN, miralax  PRN, Kcl 10 mEq, compazine , pericolace, NaCl @500mL       Current Nutrition Intake & Therapies:    Average Meal Intake: NPO   Average Supplement Intake: NPO   ADULT ORAL NUTRITION SUPPLEMENT Dinner; Protein Modular   DIET NPO      Anthropometric Measures:   Height: 5' 2.99 (160 cm)   Ideal Body Weight (IBW): 115 lbs (52 kg)       Current Body Wt:  109.9 kg (242 lb 4.6 oz), 210.7 % IBW. Not specified   Current BMI (kg/m2): 42.9   Usual Body Weight: 129.3 kg (285 lb)   % Weight Change (Calculated): -15   Weight Adjustment: No adjustment              BMI Category: Obese class 3 (BMI 40.0 or greater)        Wt Readings from Last 10 Encounters:        03/22/21  109.9 kg (242 lb 3.2 oz)     03/15/21  126.6 kg (279 lb)     03/12/21  127 kg (279 lb 15.8 oz)  02/26/21  127 kg (280 lb)     09/17/20  123.8 kg (273 lb)     08/15/20  127.1 kg (280 lb 3.3 oz)     07/14/20  127.9 kg (282 lb)     05/26/20  130.6 kg (288 lb)     05/19/20  130.6 kg (288 lb)        04/25/20  131.1 kg (289 lb 0.4 oz)           Estimated Daily Nutrient Needs:   Energy Requirements Based On: Formula   Weight Used for Energy Requirements: Current   Energy (kcal/day): 2428 (MSJ x 1.3 x 1.1)   Weight Used for Protein Requirements: Current   Protein (g/day): 87-110 (0.8-1g/kg)   Method Used for Fluid Requirements: 1 ml/kcal   Fluid (ml/day): 2428 (71ml/kcal)      Nutrition Diagnosis:      Inadequate oral intake related to inadequate protein-energy intake as evidenced by NPO or clear liquid status due to medical condition     Inadequate protein-energy intake related to altered GI function as evidenced by nausea, vomiting, diarrhea, GI abnormality      Nutrition Interventions:    Food and/or Nutrient Delivery: Modify current diet, Continue oral nutrition supplement   Nutrition Education/Counseling: No recommendations at this time   Coordination of Nutrition Care: Continue to monitor while inpatient, Interdisciplinary rounds   Plan of Care discussed with: IDR team and patient      Goals:Previous Goal Met: No progress toward goal(s)   Goals: PO intake  50% or greater, by next RD assessment          Nutrition Monitoring and Evaluation:    Behavioral-Environmental Outcomes: None identified   Food/Nutrient Intake Outcomes: Diet advancement/tolerance, Food and nutrient intake, Supplement intake   Physical Signs/Symptoms Outcomes: Biochemical data, GI status, Weight      Discharge Planning:     Too soon to determine         Contact: Merilee Soulier   RD Intern   RD office: [phone]

## 2021-03-25 NOTE — Progress Notes (Signed)
General Surgery Daily Progress Note    Patient: Alyssa [name] MRN: [id number]  SSN: [id number]    Date of Birth: 11/12/1980  Age: 40 y.o.  Sex: female      Admit Date: 03/19/2021    POD * No surgery found *    Procedure: * No surgery found *    Subjective:   Patient reports vomiting yesterday  Per nurse about 600cc.  Pt thinks it was after her dilaudid given  She is still asking for dilaudid almost every 3 hrs  She is also asking for compazine every 6hrs  Reports having some diarrhea  States had some nausea when eating cream of wheat this AM    Current Facility-Administered Medications   Medication Dose Route Frequency    lisinopriL (PRINIVIL, ZESTRIL) tablet 20 mg  20 mg Oral DAILY    senna-docusate (PERICOLACE) 8.6-50 mg per tablet 1 Tablet  1 Tablet Oral DAILY    metoprolol (LOPRESSOR) injection 5 mg  5 mg IntraVENous Q6H PRN    famotidine (PF) (PEPCID) 20 mg in 0.9% sodium chloride 10 mL injection  20 mg IntraVENous Q12H    polyethylene glycol (MIRALAX) packet 17 g  17 g Oral DAILY PRN    insulin lispro (HUMALOG) injection   SubCUTAneous AC&HS    prochlorperazine (COMPAZINE) injection 10 mg  10 mg IntraVENous Q6H PRN    acetaminophen (TYLENOL) tablet 1,000 mg  1,000 mg Oral Q6H    HYDROmorphone (DILAUDID) injection 1 mg  1 mg IntraVENous Q3H PRN    ondansetron (ZOFRAN) injection 4 mg  4 mg IntraVENous Q6H PRN    glucose chewable tablet 16 g  4 Tablet Oral PRN    glucagon (GLUCAGEN) injection 1 mg  1 mg IntraMUSCular PRN    dextrose 10% infusion 0-250 mL  0-250 mL IntraVENous PRN    enoxaparin (LOVENOX) injection 30 mg  30 mg SubCUTAneous Q12H        Objective:   No intake/output data recorded.  12/06 1901 - 12/08 0700  In: 1462.5 [P.O.:100; I.V.:1362.5]  Out: 500   Patient Vitals for the past 8 hrs:   BP Temp Pulse Resp SpO2   03/25/21 0745 (!) 147/88 98.4 ??F (36.9 ??C) (!) 113 17 97 %   03/25/21 0700 -- -- (!) 109 -- --   03/25/21 0402 (!) 159/92 97.7 ??F (36.5 ??C) 90 18 98 %       Physical Exam:  General: Alert,  cooperative, NAD  Lungs: Unlabored  Abdomen: Soft, still TTP epigastric and upper right abdomen.  All incisions C/D/I  Extremities: Warm, moves all, no edema  Skin:  Warm and dry, no rash    Labs: No results for input(s): WBC, HGB, HCT, PLT, HGBEXT, HCTEXT, PLTEXT in the last 72 hours.  Recent Labs     03/25/21  0139   NA 137   K 3.0*   CL 102   CO2 28   GLU 136*   BUN 6   CREA 0.62   CA 9.0   MG 1.4*   PHOS 4.2       Assessment / Plan:     POD * No surgery found *    Procedure: * No surgery found *    Will make NPO now for gastric emptying study after 230pm  Once study done, will start on around the clock oral antinausea medication and also try oral narcotics.  Need to try and get patient off of IV meds  Edwena Bunde, MD

## 2021-03-25 NOTE — Progress Notes (Signed)
Problem: Pain  Goal: *Control of Pain  Outcome: Progressing Towards Goal     Problem: Impaired Skin Integrity/Pressure Injury Treatment  Goal: *Prevention of pressure injury  Description: Document [name] and appropriate interventions in the flowsheet.  Outcome: Progressing Towards Goal  Note: Pressure Injury Interventions:            Activity Interventions: PT/OT evaluation    Mobility Interventions: PT/OT evaluation, Assess need for specialty bed    Nutrition Interventions: Document food/fluid/supplement intake, Discuss nutritional consult with provider, Offer support with meals,snacks and hydration    Friction and Shear Interventions: Minimize layers

## 2021-03-25 NOTE — Progress Notes (Signed)
RRT RN rounded on patient for a MEWS of 3, tachycardia. Patient resting in bed in no acute distress and no complaints at this time. [place] IV placed, surgery at bedside to re evaluate. No further needs at this time.           **Ultrasound IV by Blake Divine, RN :  Procedure Note    Patient meets criteria for [place] IV insertion.     Ultrasound IV education provided to patient. Opportunities for questions given.      Ultrasound used for PIV placement:  20gauge 1.25in BD Nexiva  L upper arm location.  1 X Attempt(s).    Flushed with ease; vigorous blood return.     Procedure tolerated well. Primary RN aware of IV placement and added to LDA.      Blake Divine, RN
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
## 2021-03-25 NOTE — Group Note (Signed)
Diabetes Mgmt by Romie Jumper, CNS at 03/25/21 1112                Author: Romie Jumper, CNS  Service: Certified Clinical Nurse Specialist  Author Type: Clinical Nurse Specialist       Filed: 03/25/21 1115  Date of Service: 03/25/21 1112  Status: Signed          Editor: Romie Jumper, CNS (Clinical Nurse Specialist)               Diabetes Management Team to sign off at this point as patient's blood glucose remains stable. She has not requried any basal insulin for several days. I did put a referral in for  outpatient [place] dietician support for possible gastroparesis. Please re-consult [place] if patient needs change.  Thank you for including [place] in their care.                 Signed By:  Romie Jumper, CNS    [facility]          March 25, 2021

## 2021-03-25 NOTE — Progress Notes (Signed)
Bedside and Verbal shift change report given to [place] (Soil scientist) by Romie Minus (offgoing nurse). Report included the following information SBAR, Kardex, Procedure Summary, Intake/Output, and MAR.

## 2021-03-25 NOTE — Progress Notes (Signed)
03/25/2021  1:45 PM  Update via chart review, pt is continuing to require medical management for SBO, SBFT today   Transitions of Care Plan:  RUR 15 % Moderate Risk of Readmission/Yellow  Pt is a Readmission to this hospital   LOS 6 Days   Medical management continues  Plan for gastric emptying study today   Diabetes Management following   Dietary following  CM to follow through for treatment/response  DC when stable to home   Outpatient follow up PCP, specialists   Family will transport at DC   CM will continue to follow and assist w/ DC needs  Alinda Dooms, BS   Case Manager

## 2021-03-25 NOTE — Consults (Signed)
 Consults by Corson, Kali  at 03/25/21 1321                Author: Horald Krone  Service: Registered Dietitian or Nutrition Professional  Author Type: Dietetic Student       Filed: 03/25/21 1323  Date of Service: 03/25/21 1321  Status: Attested           Editor: Horald Krone (Dietetic Student)  Cosigner: Perley Bergeron, RD at 03/25/21 1334            Consult Orders        1. IP CONSULT TO NUTRITION SERVICES [[id number]] ordered by [place], Joanne E., MD at 03/25/21 1100                         Attestation signed by Perley Bergeron, RD at 03/25/21 1334          RD Preceptor present for Dietetic Intern's assessment and evaluation of this patient. I discussed the findings, assessment and plan with the Dietetic Intern  and agree with their findings and plan as documented in their note.      Bergeron Perley, RD, MS   Contact via [employer] or office [phone]                                 Nutrition Education        Educated on Low fiber, low fat diet for gastroparesis.      Learners: Patient     Readiness: Acceptance     Method: Explanation and Handout     Response: Verbalizes Understanding     Contact name and number provided.         Contact Number: Krone Horald   RD Intern   RD office: [phone]

## 2021-03-26 LAB — CBC WITH AUTO DIFFERENTIAL
Basophils %: 1 % (ref 0–1)
Basophils %: 1 % (ref 0–1)
Basophils Absolute: 0.1 10*3/uL (ref 0.0–0.1)
Basophils Absolute: 0.1 10*3/uL (ref 0.0–0.1)
Eosinophils %: 0 % (ref 0–7)
Eosinophils %: 0 % (ref 0–7)
Eosinophils Absolute: 0 10*3/uL (ref 0.0–0.4)
Eosinophils Absolute: 0 10*3/uL (ref 0.0–0.4)
Granulocyte Absolute Count: 0 10*3/uL (ref 0.00–0.04)
Granulocyte Absolute Count: 0 10*3/uL (ref 0.00–0.04)
Hematocrit: 30.5 % — ABNORMAL LOW (ref 35.0–47.0)
Hematocrit: 31.7 % — ABNORMAL LOW (ref 35.0–47.0)
Hemoglobin: 10.1 g/dL — ABNORMAL LOW (ref 11.5–16.0)
Hemoglobin: 9.7 g/dL — ABNORMAL LOW (ref 11.5–16.0)
Immature Granulocytes: 0 % (ref 0.0–0.5)
Immature Granulocytes: 0 % (ref 0.0–0.5)
Lymphocytes %: 19 % (ref 12–49)
Lymphocytes %: 26 % (ref 12–49)
Lymphocytes Absolute: 2.1 10*3/uL (ref 0.8–3.5)
Lymphocytes Absolute: 2.5 10*3/uL (ref 0.8–3.5)
MCH: 25.4 PG — ABNORMAL LOW (ref 26.0–34.0)
MCH: 25.8 PG — ABNORMAL LOW (ref 26.0–34.0)
MCHC: 31.8 g/dL (ref 30.0–36.5)
MCHC: 31.9 g/dL (ref 30.0–36.5)
MCV: 79.6 FL — ABNORMAL LOW (ref 80.0–99.0)
MCV: 81.1 FL (ref 80.0–99.0)
MPV: 11 FL (ref 8.9–12.9)
MPV: 11.1 FL (ref 8.9–12.9)
Monocytes %: 11 % (ref 5–13)
Monocytes %: 9 % (ref 5–13)
Monocytes Absolute: 1 10*3/uL (ref 0.0–1.0)
Monocytes Absolute: 1 10*3/uL (ref 0.0–1.0)
NRBC Absolute: 0 10*3/uL (ref 0.00–0.01)
NRBC Absolute: 0 10*3/uL (ref 0.00–0.01)
Neutrophils %: 62 % (ref 32–75)
Neutrophils %: 71 % (ref 32–75)
Neutrophils Absolute: 5.8 10*3/uL (ref 1.8–8.0)
Neutrophils Absolute: 8.3 10*3/uL — ABNORMAL HIGH (ref 1.8–8.0)
Nucleated RBCs: 0 PER 100 WBC
Nucleated RBCs: 0 PER 100 WBC
Platelets: 407 10*3/uL — ABNORMAL HIGH (ref 150–400)
Platelets: 440 10*3/uL — ABNORMAL HIGH (ref 150–400)
RBC: 3.76 M/uL — ABNORMAL LOW (ref 3.80–5.20)
RBC: 3.98 M/uL (ref 3.80–5.20)
RDW: 15.7 % — ABNORMAL HIGH (ref 11.5–14.5)
RDW: 15.8 % — ABNORMAL HIGH (ref 11.5–14.5)
WBC: 11.5 10*3/uL — ABNORMAL HIGH (ref 3.6–11.0)
WBC: 9.4 10*3/uL (ref 3.6–11.0)

## 2021-03-26 LAB — POCT GLUCOSE
POC Glucose: 133 mg/dL — ABNORMAL HIGH (ref 65–117)
POC Glucose: 215 mg/dL — ABNORMAL HIGH (ref 65–117)
POC Glucose: 163 mg/dL — ABNORMAL HIGH (ref 65–117)
POC Glucose: 123 mg/dL — ABNORMAL HIGH (ref 65–117)

## 2021-03-26 LAB — BASIC METABOLIC PANEL
Anion Gap: 8 mmol/L (ref 5–15)
Anion Gap: 6 mmol/L (ref 5–15)
BUN: 3 MG/DL — ABNORMAL LOW (ref 6–20)
BUN: 3 MG/DL — ABNORMAL LOW (ref 6–20)
Bun/Cre Ratio: 5 — ABNORMAL LOW (ref 12–20)
Bun/Cre Ratio: 5 — ABNORMAL LOW (ref 12–20)
CO2: 27 mmol/L (ref 21–32)
CO2: 27 mmol/L (ref 21–32)
Calcium: 8.7 MG/DL (ref 8.5–10.1)
Calcium: 9.1 MG/DL (ref 8.5–10.1)
Chloride: 104 mmol/L (ref 97–108)
Chloride: 103 mmol/L (ref 97–108)
Creatinine: 0.56 MG/DL (ref 0.55–1.02)
Creatinine: 0.66 MG/DL (ref 0.55–1.02)
ESTIMATED GLOMERULAR FILTRATION RATE: >60 mL/min/{1.73_m2} (ref >60)
ESTIMATED GLOMERULAR FILTRATION RATE: >60 mL/min/{1.73_m2} (ref >60)
Glucose: 154 mg/dL — ABNORMAL HIGH (ref 65–100)
Glucose: 192 mg/dL — ABNORMAL HIGH (ref 65–100)
Potassium: 2.8 mmol/L — ABNORMAL LOW (ref 3.5–5.1)
Potassium: 3.4 mmol/L — ABNORMAL LOW (ref 3.5–5.1)
Sodium: 139 mmol/L (ref 136–145)
Sodium: 136 mmol/L (ref 136–145)

## 2021-03-26 LAB — D-DIMER, QUANTITATIVE: D-Dimer, Quant: 0.53 mg/L FEU (ref 0.00–0.65)

## 2021-03-26 LAB — MAGNESIUM
Magnesium: 1.4 mg/dL — ABNORMAL LOW (ref 1.6–2.4)
Magnesium: 1.4 mg/dL — ABNORMAL LOW (ref 1.6–2.4)
Magnesium: 2.6 mg/dL — ABNORMAL HIGH (ref 1.6–2.4)
Magnesium: 2.6 mg/dL — ABNORMAL HIGH (ref 1.6–2.4)

## 2021-03-26 LAB — PHOSPHORUS
Phosphorus: 3.4 MG/DL (ref 2.6–4.7)
Phosphorus: 3.4 MG/DL (ref 2.6–4.7)

## 2021-03-26 LAB — PERIPHERAL SMEAR

## 2021-03-26 LAB — RETICULOCYTE COUNT
Absolute Retic Cnt.: 0.0709 M/ul (ref 0.0164–0.0776)
Absolute Retic Cnt.: 0.0709 M/ul (ref 0.0164–0.0776)
Retic Ct Pct: 1.9 % (ref 0.7–2.1)
Reticulocyte count: 1.9 % (ref 0.7–2.1)

## 2021-03-26 LAB — CBC WITH AUTOMATED DIFF
ABS. BASOPHILS: 0.1 10*3/uL (ref 0.0–0.1)
ABS. BASOPHILS: 0.1 10*3/uL (ref 0.0–0.1)
ABS. EOSINOPHILS: 0 10*3/uL (ref 0.0–0.4)
ABS. EOSINOPHILS: 0 10*3/uL (ref 0.0–0.4)
ABS. IMM. GRANS.: 0 10*3/uL (ref 0.00–0.04)
ABS. IMM. GRANS.: 0 10*3/uL (ref 0.00–0.04)
ABS. LYMPHOCYTES: 2.1 10*3/uL (ref 0.8–3.5)
ABS. LYMPHOCYTES: 2.5 10*3/uL (ref 0.8–3.5)
ABS. MONOCYTES: 1 10*3/uL (ref 0.0–1.0)
ABS. MONOCYTES: 1 10*3/uL (ref 0.0–1.0)
ABS. NEUTROPHILS: 5.8 10*3/uL (ref 1.8–8.0)
ABS. NEUTROPHILS: 8.3 10*3/uL — ABNORMAL HIGH (ref 1.8–8.0)
ABSOLUTE NRBC: 0 10*3/uL (ref 0.00–0.01)
ABSOLUTE NRBC: 0 10*3/uL (ref 0.00–0.01)
BASOPHILS: 1 % (ref 0–1)
BASOPHILS: 1 % (ref 0–1)
EOSINOPHILS: 0 % (ref 0–7)
EOSINOPHILS: 0 % (ref 0–7)
HCT: 30.5 % — ABNORMAL LOW (ref 35.0–47.0)
HCT: 31.7 % — ABNORMAL LOW (ref 35.0–47.0)
HGB: 10.1 g/dL — ABNORMAL LOW (ref 11.5–16.0)
HGB: 9.7 g/dL — ABNORMAL LOW (ref 11.5–16.0)
IMMATURE GRANULOCYTES: 0 % (ref 0.0–0.5)
IMMATURE GRANULOCYTES: 0 % (ref 0.0–0.5)
LYMPHOCYTES: 19 % (ref 12–49)
LYMPHOCYTES: 26 % (ref 12–49)
MCH: 25.4 PG — ABNORMAL LOW (ref 26.0–34.0)
MCH: 25.8 PG — ABNORMAL LOW (ref 26.0–34.0)
MCHC: 31.8 g/dL (ref 30.0–36.5)
MCHC: 31.9 g/dL (ref 30.0–36.5)
MCV: 79.6 FL — ABNORMAL LOW (ref 80.0–99.0)
MCV: 81.1 FL (ref 80.0–99.0)
MONOCYTES: 11 % (ref 5–13)
MONOCYTES: 9 % (ref 5–13)
MPV: 11 FL (ref 8.9–12.9)
MPV: 11.1 FL (ref 8.9–12.9)
NEUTROPHILS: 62 % (ref 32–75)
NEUTROPHILS: 71 % (ref 32–75)
NRBC: 0 PER 100 WBC
NRBC: 0 PER 100 WBC
PLATELET: 407 10*3/uL — ABNORMAL HIGH (ref 150–400)
PLATELET: 440 10*3/uL — ABNORMAL HIGH (ref 150–400)
RBC: 3.76 M/uL — ABNORMAL LOW (ref 3.80–5.20)
RBC: 3.98 M/uL (ref 3.80–5.20)
RDW: 15.7 % — ABNORMAL HIGH (ref 11.5–14.5)
RDW: 15.8 % — ABNORMAL HIGH (ref 11.5–14.5)
WBC: 11.5 10*3/uL — ABNORMAL HIGH (ref 3.6–11.0)
WBC: 9.4 10*3/uL (ref 3.6–11.0)

## 2021-03-26 LAB — METABOLIC PANEL, BASIC
Anion gap: 8 mmol/L (ref 5–15)
Anion gap: 6 mmol/L (ref 5–15)
BUN/Creatinine ratio: 5 — ABNORMAL LOW (ref 12–20)
BUN/Creatinine ratio: 5 — ABNORMAL LOW (ref 12–20)
BUN: 3 MG/DL — ABNORMAL LOW (ref 6–20)
BUN: 3 MG/DL — ABNORMAL LOW (ref 6–20)
CO2: 27 mmol/L (ref 21–32)
CO2: 27 mmol/L (ref 21–32)
Calcium: 8.7 MG/DL (ref 8.5–10.1)
Calcium: 9.1 MG/DL (ref 8.5–10.1)
Chloride: 104 mmol/L (ref 97–108)
Chloride: 103 mmol/L (ref 97–108)
Creatinine: 0.56 MG/DL (ref 0.55–1.02)
Creatinine: 0.66 MG/DL (ref 0.55–1.02)
Glucose: 154 mg/dL — ABNORMAL HIGH (ref 65–100)
Glucose: 192 mg/dL — ABNORMAL HIGH (ref 65–100)
Potassium: 2.8 mmol/L — ABNORMAL LOW (ref 3.5–5.1)
Potassium: 3.4 mmol/L — ABNORMAL LOW (ref 3.5–5.1)
Sodium: 139 mmol/L (ref 136–145)
Sodium: 136 mmol/L (ref 136–145)
eGFR: >60 mL/min/{1.73_m2} (ref >60)
eGFR: >60 mL/min/{1.73_m2} (ref >60)

## 2021-03-26 LAB — GLUCOSE, POC
Glucose (POC): 133 mg/dL — ABNORMAL HIGH (ref 65–117)
Glucose (POC): 215 mg/dL — ABNORMAL HIGH (ref 65–117)
Glucose (POC): 163 mg/dL — ABNORMAL HIGH (ref 65–117)
Glucose (POC): 123 mg/dL — ABNORMAL HIGH (ref 65–117)

## 2021-03-26 LAB — D DIMER: D-dimer: 0.53 mg/L FEU (ref 0.00–0.65)

## 2021-03-26 MED ORDER — MAGNESIUM SULFATE 2 GRAM/50 ML IVPB
2 gram/50 mL (4 %) | INTRAVENOUS | Status: AC
Start: 2021-03-26 — End: 2021-03-26
  Administered 2021-03-26 (×2): via INTRAVENOUS

## 2021-03-26 MED ORDER — SODIUM CHLORIDE 0.9% BOLUS IV
0.9 % | Freq: Once | INTRAVENOUS | Status: AC
Start: 2021-03-26 — End: 2021-03-26
  Administered 2021-03-26: 14:00:00 via INTRAVENOUS

## 2021-03-26 MED ORDER — POTASSIUM CHLORIDE SR 10 MEQ TAB
10 mEq | ORAL | Status: AC
Start: 2021-03-26 — End: 2021-03-26
  Administered 2021-03-26: 14:00:00 via ORAL

## 2021-03-26 MED ORDER — MAGNESIUM OXIDE 400 MG TAB
400 mg | Freq: Every day | ORAL | Status: DC
Start: 2021-03-26 — End: 2021-03-26

## 2021-03-26 MED ORDER — PROCHLORPERAZINE MALEATE 5 MG TAB
5 mg | ORAL_TABLET | Freq: Four times a day (QID) | ORAL | 0 refills | Status: AC | PRN
Start: 2021-03-26 — End: 2021-04-02

## 2021-03-26 MED ORDER — FAMOTIDINE 20 MG TAB
20 mg | Freq: Two times a day (BID) | ORAL | Status: DC
Start: 2021-03-26 — End: 2021-03-26

## 2021-03-26 MED ORDER — POTASSIUM CHLORIDE SR 10 MEQ TAB
10 mEq | ORAL | Status: AC
Start: 2021-03-26 — End: 2021-03-26
  Administered 2021-03-26: 11:00:00 via ORAL

## 2021-03-26 MED ORDER — HYDROMORPHONE 2 MG TAB
2 mg | ORAL_TABLET | ORAL | 0 refills | Status: AC | PRN
Start: 2021-03-26 — End: 2021-03-29

## 2021-03-26 MED FILL — MAGNESIUM SULFATE 2 GRAM/50 ML IVPB: 2 gram/50 mL (4 %) | INTRAVENOUS | Qty: 50

## 2021-03-26 MED FILL — ACETAMINOPHEN 500 MG TAB: 500 mg | ORAL | Qty: 2

## 2021-03-26 MED FILL — PROCHLORPERAZINE MALEATE 5 MG TAB: 5 mg | ORAL | Qty: 1

## 2021-03-26 MED FILL — INSULIN LISPRO 100 UNIT/ML INJECTION: 100 unit/mL | SUBCUTANEOUS | Qty: 1

## 2021-03-26 MED FILL — LISINOPRIL 20 MG TAB: 20 mg | ORAL | Qty: 1

## 2021-03-26 MED FILL — FAMOTIDINE (PF) 20 MG/2 ML IV: 20 mg/2 mL | INTRAVENOUS | Qty: 2

## 2021-03-26 MED FILL — ENOXAPARIN 30 MG/0.3 ML SUB-Q SYRINGE: 30 mg/0.3 mL | SUBCUTANEOUS | Qty: 0.3

## 2021-03-26 MED FILL — METOPROLOL TARTRATE 5 MG/5 ML IV SOLN: 5 mg/ mL | INTRAVENOUS | Qty: 5

## 2021-03-26 MED FILL — HYDROMORPHONE 2 MG TAB: 2 mg | ORAL | Qty: 1

## 2021-03-26 MED FILL — POTASSIUM CHLORIDE SR 10 MEQ TAB: 10 mEq | ORAL | Qty: 4

## 2021-03-26 MED FILL — SENNOSIDES-DOCUSATE SODIUM 8.6 MG-50 MG TAB: ORAL | Qty: 1

## 2021-03-26 MED FILL — SODIUM CHLORIDE 0.9 % IV: INTRAVENOUS | Qty: 500

## 2021-03-26 NOTE — Progress Notes (Signed)
Progress  Notes by Mertie Moores, MD at 03/26/21 0800                Author: Mertie Moores, MD  Service: FAMILY MEDICINE  Author Type: Resident       Filed: 03/26/21 1451  Date of Service: 03/26/21 0800  Status: Attested Addendum          Editor: Mertie Moores, MD (Resident)       Related Notes: Original Note by Mertie Moores, MD (Resident) filed at 03/26/21 1404          Cosigner: Ramond Craver, MD at 03/26/21 1458          Attestation signed by Ramond Craver, MD at 03/26/21 1458          I saw and evaluated the patient, performing the key elements of the service. I discussed the findings, assessment and plan with the resident and agree with  the resident's findings and plan as documented in the resident's note.      Feeling better.  No n/v. Tolerating PO. +BM.  No CP/SOB.      Tachycardia: Improving with IVF and pain control.  Monitor.       Remainder per Resident's note.                                   [address]      [place] [address]    Office [phone]   Fax [phone]        DAILY PROGRESS NOTE      24 Hour Events: NAEO      SUBJECTIVE: Patient says her pain is well controlled with medications. She is feeling well. Denies headache, blurry vision, chest pain, SOB, nausea, vomiting, abdominal pain, dizziness.       OBJECTIVE:      Vitals: Visit Vitals      BP  (!) 177/97 (BP 1 Location: Right upper arm, BP Patient Position: Lying)     Pulse  (!) 125     Temp  99.5 ??F (37.5 ??C)     Resp  18     Ht  5' 2.99" (1.6 m)     Wt  242 lb 3.2 oz (109.9 kg)     SpO2  95%        BMI  42.91 kg/m??        Physical Exam:   General: NAD.   Respiratory: CTAB.    Cardiovascular: Regular rate.   GI: Nondistended. + bowel sounds. Non-tender to palpation   Extremities: No LE edema.    Skin: Warm, dry.   Neuro: Alert and orientedx2      I/O:       Date  03/24/21 1900 - 03/25/21 0659  03/25/21 0700 - 03/26/21 0659            Shift  1900-0659  24 Hour Total  0700-1859  1900-0659  24  Hour Total       INTAKE            I.V.(mL/kg/hr)    327.5                    Volume (lactated Ringers infusion)    327.5                    Volume (dextrose 10% infusion 0-250 mL)  0                  Shift Total(mL/kg)    327.5(3)             OUTPUT            Urine(mL/kg/hr)                        Urine Occurrence(s)    1 x                  Emesis/NG output    500                    Emesis    500                  Shift Total(mL/kg)    500(4.6)                  NET    -172.5                  Weight (kg)  109.9  109.9  109.9  109.9  109.9           Inpatient Medications     Current Facility-Administered Medications          Medication  Dose  Route  Frequency           ?  sodium chloride 0.9 % bolus infusion 500 mL   500 mL  IntraVENous  ONCE     ?  HYDROmorphone (DILAUDID) tablet 2 mg   2 mg  Oral  Q4H PRN     ?  prochlorperazine (COMPAZINE) tablet 5 mg   5 mg  Oral  Q6H     ?  lisinopriL (PRINIVIL, ZESTRIL) tablet 20 mg   20 mg  Oral  DAILY     ?  senna-docusate (PERICOLACE) 8.6-50 mg per tablet 1 Tablet   1 Tablet  Oral  DAILY     ?  metoprolol (LOPRESSOR) injection 5 mg   5 mg  IntraVENous  Q6H PRN     ?  famotidine (PF) (PEPCID) 20 mg in 0.9% sodium chloride 10 mL injection   20 mg  IntraVENous  Q12H     ?  polyethylene glycol (MIRALAX) packet 17 g   17 g  Oral  DAILY PRN     ?  insulin lispro (HUMALOG) injection     SubCUTAneous  AC&HS     ?  acetaminophen (TYLENOL) tablet 1,000 mg   1,000 mg  Oral  Q6H     ?  HYDROmorphone (DILAUDID) injection 1 mg   1 mg  IntraVENous  Q3H PRN     ?  ondansetron (ZOFRAN) injection 4 mg   4 mg  IntraVENous  Q6H PRN     ?  glucose chewable tablet 16 g   4 Tablet  Oral  PRN     ?  glucagon (GLUCAGEN) injection 1 mg   1 mg  IntraMUSCular  PRN     ?  dextrose 10% infusion 0-250 mL   0-250 mL  IntraVENous  PRN           ?  enoxaparin (LOVENOX) injection 30 mg   30 mg  SubCUTAneous  Q12H           Allergies     Allergies        Allergen  Reactions         ?  Betadine  [Povidone-Iodine]  Hives and Rash     ?  Codeine  Nausea and Vomiting     ?  Contrast Dye [Iodine]  Nausea and Vomiting     ?  Georg[employer] Leaf-Tree  Hives     ?  Sulfa (Sulfonamide Antibiotics)  Rash         ?  Tramadol  Itching           CBC:     Recent Labs           03/25/21   1906     WBC  11.5*     HGB  10.1*     HCT  31.7*        PLT  440*           Metabolic Panel:     Recent Labs             03/25/21   0139  03/24/21   0132  03/23/21   0208     NA  137   --    --      K  3.0*   --    --      CL  102   --    --      CO2  28   --    --      BUN  6   --    --      CREA  0.62   --    --      GLU  136*   --    --      CA  9.0   --    --      MG  1.4*  1.4*  1.2*          PHOS  4.2  4.4  4.0                   Assessment and Plan     Alyssa [name] is a 40 y.o. female with history of multiple abdominal surgeries c/b SBO for which open lysis of adhesions was performed on 02/26/21, now admitted for early post-op  SBO. The family medicine service has been consulted for evaluation of tachycardia.          SBO: Management per Surgery       Tachycardia: HR improved after 1L bolus NS. Differential is broad, includes dehydration,  medication reaction (hydralazine), anemia, electrolyte abnormalities. EKG: sinus tachy. Wells score is 3 putting patient in moderate risk group. Hgb: 9.7. Stable.   - Replete potassium   - D-dimer negative for VTE   - Would avoid hydralazine for BP control as this can cause reflex tachycardia   - Follow up outpatient with PCP for anemia workup       DM: Stable. Last A1c from December 3 was 8.0%.  It appears this is being managed by diabetes management   - Per diabetes management    - Holding home Metformin       HTN: Continue Lisinopril. Consider Labetalol PRN if HR allows for SBP>180 and DBP>110       HLD: ASCVD 21.7%. Continue Crestor, consider high-intensity dosing as outpatient.        Ovarian cyst: Outpatient f/up.       Pain Management: Per Ortho       DVT Prophylaxis: Per Ortho        Disposition: Per Ortho  Mertie Moores, MD   Family Medicine Resident              For Billing          Chief Complaint      Patient presents with      ?  Abdominal Pain      ?  Vomiting               Hospital Problems   Date Reviewed: 12-18-2020                       Codes  Class  Noted  POA        SBO (small bowel obstruction) ([place])  ICD-10-CM: V25.366   ICD-9-CM: 560.9    02/23/2021  Unknown

## 2021-03-26 NOTE — Progress Notes (Signed)
General Surgery Daily Progress Note    Patient: Alyssa [name] MRN: [id number]  SSN: [id number]    Date of Birth: Sep 11, 1980  Age: 40 y.o.  Sex: female      Admit Date: 03/19/2021    POD * No surgery found *    Procedure: * No surgery found *    Subjective:   Reports feeling well today  Tol diet  Having BM  Pain well controlled on oral dilaudid    Current Facility-Administered Medications   Medication Dose Route Frequency    magnesium sulfate 2 g/50 ml IVPB (premix or compounded)  2 g IntraVENous Q2H    famotidine (PEPCID) tablet 20 mg  20 mg Oral Q12H    [START ON 03/27/2021] magnesium oxide (MAG-OX) tablet 400 mg  400 mg Oral DAILY    HYDROmorphone (DILAUDID) tablet 2 mg  2 mg Oral Q4H PRN    prochlorperazine (COMPAZINE) tablet 5 mg  5 mg Oral Q6H    lisinopriL (PRINIVIL, ZESTRIL) tablet 20 mg  20 mg Oral DAILY    senna-docusate (PERICOLACE) 8.6-50 mg per tablet 1 Tablet  1 Tablet Oral DAILY    metoprolol (LOPRESSOR) injection 5 mg  5 mg IntraVENous Q6H PRN    polyethylene glycol (MIRALAX) packet 17 g  17 g Oral DAILY PRN    insulin lispro (HUMALOG) injection   SubCUTAneous AC&HS    acetaminophen (TYLENOL) tablet 1,000 mg  1,000 mg Oral Q6H    HYDROmorphone (DILAUDID) injection 1 mg  1 mg IntraVENous Q3H PRN    ondansetron (ZOFRAN) injection 4 mg  4 mg IntraVENous Q6H PRN    glucose chewable tablet 16 g  4 Tablet Oral PRN    glucagon (GLUCAGEN) injection 1 mg  1 mg IntraMUSCular PRN    dextrose 10% infusion 0-250 mL  0-250 mL IntraVENous PRN    enoxaparin (LOVENOX) injection 30 mg  30 mg SubCUTAneous Q12H        Objective:   No intake/output data recorded.  No intake/output data recorded.  Patient Vitals for the past 8 hrs:   BP Temp Pulse Resp SpO2   03/26/21 1122 (!) 151/96 97.9 ??F (36.6 ??C) 94 17 97 %   03/26/21 0819 (!) 144/83 98.3 ??F (36.8 ??C) (!) 110 16 94 %       Physical Exam:  General: Alert, cooperative, NAD  Lungs: Unlabored  Abdomen: Soft, minimally TTP, incisions C/D/I  Extremities: Warm, moves all, no  edema  Skin:  Warm and dry, no rash    Labs:   Recent Labs     03/26/21  0129   WBC 9.4   HGB 9.7*   HCT 30.5*   PLT 407*     Recent Labs     03/26/21  0129   NA 139   K 2.8*   CL 104   CO2 27   GLU 154*   BUN 3*   CREA 0.56   CA 8.7   MG 1.4*   PHOS 3.4       Assessment / Plan:     POD * No surgery found *    Procedure: * No surgery found *  Doing well  Will set up for discharge  Needs to follow up with Dr. Wynetta Emery  Dietary gave her information on gastroparesis diet        Edwena Bunde, MD

## 2021-03-26 NOTE — Progress Notes (Signed)
VAT- Placed US guided Extended Dwell Endurance 8cm Catheter to right upper cephalic.  Flushes well with positive blood return.

## 2021-03-26 NOTE — Progress Notes (Signed)
1042: RN called Family Medicine, who ordered two runs of IV magnesium this morning. Patient's endurance IV began to hurt during infusion. Vascular access team was called to place new line. RN asked family medicine if there is PO alternative for magnesium. MD said no alternative. MD wants to wait for new line to be placed and for bolus to be given first due to patient's elevated heart rate. Tele called around 1015 that patient's HR was in 140s. RN went to pull PRN IV metoprolol, but HR back down below 120 (below PRN parameters) after metoprolol drawn from med room. MD said to continue monitoring and notify if unable to get new IV access.

## 2021-03-26 NOTE — Progress Notes (Signed)
Problem: Pain  Goal: *Control of Pain  Outcome: Progressing Towards Goal     Problem: Nausea/Vomiting (Adult)  Goal: *Absence of nausea/vomiting  Outcome: Progressing Towards Goal

## 2021-03-26 NOTE — Progress Notes (Signed)
1818: RN at bedside to go over discharge instructions with patient. RN went over follow up appointments that need to be made with PCP and Dr. Wynetta Emery, meds that need to be picked up at pharmacy, and instructions for how to take these medications. Time provided for questions. IV endurance line removed. Remote telemetry removed. Patient dressed herself and collected belongings. Patient's mother at bedside to transport patient home.     1829: Volunteers at bedside to take patient down to front entrance in wheelchair to go home with her mother and children.

## 2021-03-26 NOTE — Progress Notes (Signed)
03/26/2021  12:18 PM  Update via chart review, pt is continuing to require medical management for SBO,   Transitions of Care Plan:  RUR 17 % Moderate Risk of Readmission/Yellow  Pt is a Readmission to this hospital   LOS 7 Days   Medical management continues  gastric emptying study 12/8  Diabetes Management following   Dietary following  Family Medicine following for tachycardia   CM to follow through for treatment/response  DC when stable to home   Outpatient follow up PCP, specialists   Family will transport at DC   CM will continue to follow and assist w/ DC needs  Alinda Dooms, BS   Case Manager

## 2021-03-26 NOTE — Progress Notes (Signed)
Patient unable to tolerate potassium IV. RRT RN placed 2 new IV's earlier in shift and attempted to run potassium at 10 to 20 ml/hr diluted but patient continues to not be able to tolerate potassium IV. MD aware and informed of lab this am of K+ 2.8. Received order for 1 X po potassium 59meq

## 2021-03-26 NOTE — Progress Notes (Signed)
Pharmacy Dosing Services:     Pepcid changed from IV to PO per protocol.  - Diet: Full liquid  - Takes other PO meds     Thank you,  Carlena Sax, PharmD  [phone]

## 2021-03-27 LAB — IRON AND TIBC
Iron Saturation: 7 % — ABNORMAL LOW (ref 20–50)
Iron: 20 ug/dL — ABNORMAL LOW (ref 35–150)
TIBC: 286 ug/dL (ref 250–450)

## 2021-03-27 LAB — VITAMIN B12
Vitamin B-12: 547 pg/mL (ref 193–986)
Vitamin B12: 547 pg/mL (ref 193–986)

## 2021-03-27 LAB — FERRITIN
Ferritin: 98 NG/ML (ref 8–252)
Ferritin: 98 NG/ML (ref 8–252)

## 2021-03-27 LAB — FOLATE
Folate: 13.8 ng/mL (ref 5.0–21.0)
Folate: 13.8 ng/mL (ref 5.0–21.0)

## 2021-03-27 LAB — HAPTOGLOBIN
HAPTOGLOBIN, HAPGB: 270 mg/dL — ABNORMAL HIGH (ref 30–200)
Haptoglobin: 270 mg/dL — ABNORMAL HIGH (ref 30–200)

## 2021-03-27 LAB — IRON PROFILE
Iron % saturation: 7 % — ABNORMAL LOW (ref 20–50)
Iron: 20 ug/dL — ABNORMAL LOW (ref 35–150)
TIBC: 286 ug/dL (ref 250–450)

## 2021-03-29 LAB — EKG 12-LEAD
Atrial Rate: 132 {beats}/min
P-R Interval: 96 ms
Q-T Interval: 394 ms
QRS Duration: 82 ms
QTc Calculation (Bazett): 583 ms
R Axis: 93 degrees
T Axis: 9 degrees
Ventricular Rate: 132 {beats}/min

## 2021-03-29 LAB — EKG, 12 LEAD, INITIAL
Atrial Rate: 132 {beats}/min
Calculated R Axis: 93 degrees
Calculated T Axis: 9 degrees
P-R Interval: 96 ms
Q-T Interval: 394 ms
QRS Duration: 82 ms
QTC Calculation (Bezet): 583 ms
Ventricular Rate: 132 {beats}/min

## 2021-03-29 NOTE — Progress Notes (Signed)
 Care Transitions Initial Call    Call within 2 business days of discharge: Yes     Patient: Alyssa [name] Patient DOB: 17-Apr-1981 MRN: [id number]    Last Discharge [facility]       Date Complaint Diagnosis Description Type Department Provider    03/19/21 Abdominal Pain; Vomiting Small bowel obstruction (HCC) ... ED to Hosp-Admission (Discharged) (ADMIT) [id number] Sherrine Devere HERO, MD; Joshua Purl...            Was this an external facility discharge? No Discharge Facility: [facility]    Challenges to be reviewed by the provider   Additional needs identified to be addressed with provider: yes  Appointment needed         Method of communication with provider : phone  Inpatient Readmission Risk score: Unplanned Readmit Risk Score: 16.5    Was this a readmission? yes   Patient stated reason for the admission: Small bowel obstruction    Patients top risk factors for readmission: medical condition-patient has recurring small bowel obstructions, education provided, patient has guidelines for gastroparesis diet    Interventions to address risk factors: Scheduled appointment with PCP-03/31/21 and Obtained and reviewed discharge summary and/or continuity of care documents    Care Transition Nurse (CTN) contacted the patient by telephone to perform post hospital discharge assessment. Verified name and DOB with patient as identifiers. Provided introduction to self, and explanation of the CTN role.     CTN reviewed discharge instructions, medical action plan and red flags with patient who verbalized understanding. Were discharge instructions available to patient? yes. Reviewed appropriate site of care based on symptoms and resources available to patient including: PCP, Benefits related nurse triage line, and When to call 911. Patient given an opportunity to ask questions and does not have any further questions or concerns at this time. The patient agrees to contact the PCP office for questions related to their healthcare.     Medication  reconciliation was performed with patient, who verbalizes understanding of administration of home medications. Advised obtaining a 90-day supply of all daily and as-needed medications.   Referral to Pharm D needed: no     Home Health/Outpatient orders at discharge: none  Home Health company: n/a  Date of initial visit: n/a    Durable Medical Equipment ordered at discharge: None  Durable Medical Equipment Company: n/a  Durable Medical Equipment received: n/a    Was patient discharged with a pulse oximeter? no    Discussed follow-up appointments. If no appointment was previously scheduled, appointment scheduling offered: yes. Is follow up appointment scheduled within 7 days of discharge? yes.   [facility] follow up appointment(s):   Future Appointments   Date Time Provider Department Center   03/31/2021 11:00 AM Dillon, Caitlin K, MD [facility]     Non-[facility] follow up appointment(s): Surgeon on 04/15/21 @ 2:15 pm    Plan for follow-up call in 3-5 days based on severity of symptoms and risk factors.  Plan for next call: follow up appointment-plan of care after follow up visit  CTN provided contact information for future needs.     Goals Addressed                   This Visit's Progress     Completes all follow-up appointments   On track     Educate and encourage importance of FU for prevention of complications or disease;  Assess the patient's relationship with a PCP and next FU visit scheduled;  Discuss importance  of adherence to treatment plan and follow up visits;  Identify any barriers in transportation or access to FU appointments.   Assist patient with making FU appointments as needed;   It's also a good idea to know your test results.   Keep a list of the medicines you take.  11/28: Dr. Vicci 12/9 @ 1:30.  Patient declined my offer to call to make earlier appointment for her.  11/29: Still has appt on 12/9.  Discussed red flags.  She will call her surgeon's office if she develops a fever, increase in pain w/ meds,  increased drainage/foul odor - any signs of infection.  Discussed NAL, patient states she has met her deductible so does not feel the need to call them, also has medicaid as secondary insurance. Let her know they are a good resource.  She is maintaining good fluid balance, appetite is back and having BMs regularly.  12/2: Follow up for post op on 12/9.  She will call if she feels that she is developing signs of infection.  States she is changing dressing 2 x day, and her daughter is helping her. Dressing has some brown drainage, nothing alarming.  If it begins to increase or smell bad, will call surgeon or [facility].  12/12: Scheduled f/up appt with Dr. Dillon for 12/14 for patient.  Reminded her to log blood pressure readings to provided to Dr. Olita.       Demonstrates no return to ED or red flags   On track     Assess patient knowledge of how to contact the provider for questions if needed;  Educate patient on importance of monitoring for any red flags;  Review importance of reporting any changes, "red flags" to the provider and/or PCP;  Assess patient's knowledge of discharge instructions and any restrictions;  Educate patient when to call 911;  Assess for any barriers with safety at home  Discuss use of nurse access line and location of number on the back of insurance card.  11/28: ED visit on 11/25.  Patient had yellow drainage and a staple popped out.  Was told it was not infected.  K was 2.6, was provided with potassium chloride and changes made to her roxicodone .  Patient states she is hurting, slept for 7 hours and didn't take any pain medication.  Woke up in a lot of pain.  She took pill about 30 minutes ago.  Pain is getting better now.  Asked her if she wanted me to call her surgeon, she declined.  States she had a BM on 11/27 and early this morning (11/28).  Taking miralax .    Drainage yellow in color, crusted over, she rubbed it and it came off. Looked at her wound and wound is dry.  11/29:  Taking miralax , going regularly.  Emotionally feels better.  Incision is covered with a dressing.  Pain is controlled with medicine.    12/2: Patient states she is tired, was up with her son overnight, as he was having issues with heartburn, went to bed around 4 am.  She is having regular BMs, feels antibiotic is messing up her stomach, has 8 more days, advised her to take probiotics, states she likes yogurt.  Discussed risk for DVT, pneumonia.  Advised her to do ankle pumps and deep breathing exercises.  Provided phone number to [facility], she wrote the number down.    12/12: Using extra strength tyl during the day for pain, pain level is 4 of 10, when  she is moving. Incision is healing nicely, not draining anymore, open to air.  Staples were removed in the hospital.  Patient states she will begin to check her BP, she had forgotten about that.   Encouraged her to check it a few times a day, log the information and take it with her to her appointment with Dr. Olita.  Discussed importance of maintaining well controlled blood pressure

## 2021-03-31 ENCOUNTER — Telehealth
Admit: 2021-03-31 | Discharge: 2021-03-31 | Payer: BLUE CROSS/BLUE SHIELD | Attending: Family Medicine | Primary: Family Medicine

## 2021-03-31 DIAGNOSIS — Z09 Encounter for follow-up examination after completed treatment for conditions other than malignant neoplasm: Secondary | ICD-10-CM

## 2021-03-31 MED ORDER — VALACYCLOVIR 500 MG TAB
500 mg | ORAL_TABLET | Freq: Two times a day (BID) | ORAL | 0 refills | Status: AC
Start: 2021-03-31 — End: 2021-08-12

## 2021-03-31 MED ORDER — LISINOPRIL 20 MG TAB
20 mg | ORAL_TABLET | Freq: Every day | ORAL | 3 refills | Status: AC
Start: 2021-03-31 — End: 2021-07-24

## 2021-03-31 MED ORDER — FREESTYLE LIBRE 2 SENSOR KIT
PACK | 3 refills | Status: AC
Start: 2021-03-31 — End: ?

## 2021-03-31 MED ORDER — METOPROLOL SUCCINATE SR 25 MG 24 HR TAB
25 mg | ORAL_TABLET | Freq: Every evening | ORAL | 3 refills | Status: AC
Start: 2021-03-31 — End: ?

## 2021-03-31 MED ORDER — LANCETS
3 refills | Status: AC
Start: 2021-03-31 — End: 2021-06-07

## 2021-03-31 MED ORDER — ROSUVASTATIN 20 MG TAB
20 mg | ORAL_TABLET | Freq: Every evening | ORAL | 11 refills | Status: DC
Start: 2021-03-31 — End: 2021-08-12

## 2021-03-31 MED FILL — TOUJEO MAX SOLOSTAR 300UN/ML (6ML=1 BOX): 300 [IU]/mL | 90 days supply | Qty: 24 | Fill #1 | Status: AC

## 2021-03-31 NOTE — Progress Notes (Signed)
Virtual Video Transitional Care Management Progress Note    Patient: Alyssa [name]  DOB: 01-Mar-1981  PCP: Madelin Rear, MD    Date of office visit: 03/31/2021   Date of admission: 03/19/21  Date of discharge: 03/26/21  Hospital: [facility]  ALSO 02/23/21 to 03/09/21  Call initiated w/i 2 business dates of discharge: Yes   Date of the most recent call to the patient: 03/29/2021 12:08 PM        Assessment/Plan:   Diagnoses and all orders for this visit:    1. Hospital discharge follow-up  -     PR DISCHARGE MEDS RECONCILED W/ CURRENT OUTPATIENT MED LIST    2. Type 2 diabetes mellitus treated with insulin (HCC)  -     lisinopriL (PRINIVIL, ZESTRIL) 20 mg tablet; Take 1 Tablet by mouth daily.    3. Type 2 diabetes mellitus with hyperglycemia, with long-term current use of insulin (HCC)  -     lisinopriL (PRINIVIL, ZESTRIL) 20 mg tablet; Take 1 Tablet by mouth daily.    4. Uncontrolled type 2 diabetes mellitus with hyperglycemia (HCC)  -     rosuvastatin (CRESTOR) 20 mg tablet; Take 1 Tablet by mouth nightly.    5. Type 2 diabetes with nephropathy (HCC)  -     rosuvastatin (CRESTOR) 20 mg tablet; Take 1 Tablet by mouth nightly.    6. Gastroparesis due to secondary diabetes ([place])    7. Essential hypertension  -     lisinopriL (PRINIVIL, ZESTRIL) 20 mg tablet; Take 1 Tablet by mouth daily.    8. Hypertriglyceridemia  -     rosuvastatin (CRESTOR) 20 mg tablet; Take 1 Tablet by mouth nightly.    9. Tachycardia    Other orders  -     flash glucose sensor (FreeStyle Libre 2 Sensor) kit; Change sensor every 14 days  -     valACYclovir (VALTREX) 500 mg tablet; Take 1 Tablet by mouth two (2) times a day.  -     lancets misc; Use to test blood sugars once a day as directed  -     metoprolol succinate (TOPROL-XL) 25 mg XL tablet; Take 1 Tablet by mouth nightly.     Unable to check sugars, send supplies  Her a1c was down to 8 in hosptial  3 mo recheck planned  Lisinopril for bp--but not at goal,, having tachycardia  too  Add toprol xl at night  Watch home readings  If not at goal in 4 wk recheck with me  Hydrate, healthy habits  Fu surgery  Fu gastro  Gastroparesis diet (already established on that)  Extensive hospital record review    Subjective:   Alyssa [name] is a 41 y.o. female presenting today for follow-up after hospital discharge.  This encounter and supporting documentation was reviewed if available.  Medication reconciliation was performed today.  The main problem requiring admission was SBO, s/p 2 admissions.   Complications during admission: see chart      Interval history/Current status: 140-150/90s  Hr today was 101, HR yesterday was 114, hr before was 92  Had reflex tachycardia with hydralazine in hospital    Now she is having more normal bowel movements  She did throw up yesterday  Still a little bit of abdominal pain--the other day she tried to drive but her stomach wall hurt after that, she also went to the store    Lab Results   Component Value Date/Time    Cholesterol, total  164 12/14/2020 10:15 AM    HDL Cholesterol 49 12/14/2020 10:15 AM    LDL, calculated 83.2 12/14/2020 10:15 AM    VLDL, calculated 25.4 03/27/2020 09:03 AM    Triglyceride 159 (H) 12/14/2020 10:15 AM    CHOL/HDL Ratio 2.4 03/27/2020 09:03 AM     Gastroparesis seen on gastric emptying study  Following a gastroparesis diet right now  Has a gi visit scheduled in follow up in addition to surgery    Admitting symptoms have: improved      Medications marked "taking" at this time:  Home Medications    Medication Sig Start Date End Date Taking? Authorizing Provider   flash glucose sensor (FreeStyle Libre 2 Sensor) kit Change sensor every 14 days 03/31/21  Yes Madelin Rear, MD   valACYclovir (VALTREX) 500 mg tablet Take 1 Tablet by mouth two (2) times a day. 03/31/21  Yes Madelin Rear, MD   lancets misc Use to test blood sugars once a day as directed 03/31/21  Yes Madelin Rear, MD   lisinopriL (PRINIVIL, ZESTRIL) 20 mg tablet Take 1  Tablet by mouth daily. 03/31/21  Yes Madelin Rear, MD   metoprolol succinate (TOPROL-XL) 25 mg XL tablet Take 1 Tablet by mouth nightly. 03/31/21  Yes Madelin Rear, MD   rosuvastatin (CRESTOR) 20 mg tablet Take 1 Tablet by mouth nightly. 03/31/21  Yes Madelin Rear, MD   prochlorperazine (COMPAZINE) 5 mg tablet Take 1 Tablet by mouth every six (6) hours as needed for Nausea or Vomiting for up to 7 days. 03/26/21 04/02/21 Yes Edwena Bunde., MD   metFORMIN ER (GLUCOPHAGE XR) 500 mg tablet Take 4 Tablets by mouth daily (with dinner). 12/16/20  Yes Madelin Rear, MD   insulin glargine U-300 conc (Toujeo Max U-300 SoloStar) 300 unit/mL (3 mL) inpn 80 Units by SubCUTAneous route nightly. Indications: type 2 diabetes mellitus 12/16/20  Yes Madelin Rear, MD   glucose blood VI test strips (Prodigy No Coding) strip Use to test blood sugars 4 times daily or as directed 04/27/20  Yes Madelin Rear, MD   Insulin Needles, Disposable, (Comfort EZ Pen Needles) 31 gauge x 5/16" ndle Leader brand pen needle to use once a day with basal insulin 10/10/19  Yes Madelin Rear, MD   lisinopriL (PRINIVIL, ZESTRIL) 20 mg tablet Take 1 Tablet by mouth daily. 12/16/20 03/31/21  Madelin Rear, MD   flash glucose sensor (FreeStyle [place] 2 Sensor) kit Change sensor every 14 days 12/16/20 03/31/21  Madelin Rear, MD   methocarbamoL (ROBAXIN) 750 mg tablet Take 1 Tablet by mouth three (3) times daily as needed for Muscle Spasm(s).  Patient not taking: No sig reported 09/17/20   Madelin Rear, MD   ondansetron (ZOFRAN ODT) 4 mg disintegrating tablet Take 1 Tablet by mouth every eight (8) hours as needed for Nausea or Vomiting.  Patient not taking: No sig reported 08/15/20   Nino Glow, MD   rosuvastatin (CRESTOR) 10 mg tablet Take 1 Tablet by mouth nightly. 07/14/20 03/31/21  Madelin Rear, MD   valACYclovir (VALTREX) 500 mg tablet Take 1 Tablet by mouth two (2) times a day. 04/27/20 03/31/21  Madelin Rear, MD   omega 3-DHA-EPA-fish oil 1,000 mg (120 mg-180 mg) capsule Take 1 Capsule by mouth daily.  Patient not taking: No sig reported    Provider, Historical   lancets misc Use to test blood sugars once a day as directed  10/10/19 03/31/21  Madelin Rear, MD        Review of Systems:  A 12 point review of systems was negative except as noted here or in the HPI.          Objective:     Patient-Reported Vitals 03/31/2021   Patient-Reported Weight 242lb   Patient-Reported Height -   Patient-Reported Pulse 101   Patient-Reported Temperature -   Patient-Reported SpO2 -   Patient-Reported Systolic  357   Patient-Reported Diastolic 96   Patient-Reported Peak Flow -   Patient-Reported LMP (No Data)      General: alert, cooperative, no distress   Mental  status: normal mood, behavior, speech, dress, motor activity, and thought processes, able to follow commands   HENT: NCAT   Neck: no visualized mass   Resp: no respiratory distress   Neuro: no gross deficits   Skin: no discoloration or lesions of concern on visible areas   Psychiatric: normal affect, consistent with stated mood, no evidence of hallucinations     Additional exam findings:       We discussed the expected course, resolution and complications of the diagnosis(es) in detail.  Medication risks, benefits, costs, interactions, and alternatives were discussed as indicated.  I advised her to contact the office if her condition worsens, changes or fails to improve as anticipated. She expressed understanding with the diagnosis(es) and plan.       [name], who was evaluated through a synchronous (real-time) audio-video encounter and/or her healthcare decision maker, is aware that it is a billable service, with coverage as determined by her insurance carrier. She provided verbal consent to proceed: Yes, and patient identification was verified. It was conducted pursuant to the emergency declaration under the [place],  [place] waiver authority and the [employer] Act. A caregiver was present when appropriate. Ability to conduct physical exam was limited. I was at home. The patient was at home.      Madelin Rear, MD

## 2021-03-31 NOTE — Progress Notes (Signed)
Chief Complaint   Patient presents with    Hospital Follow Up     Patient was admitted on two separate admissions for bowel obstruction.    Medication Refill     Medications have been pended however patient states that they are all going to different pharmacies. Nurse does not have ability to pend to different pharmacies.     1. Have you been to the ER, urgent care clinic since your last visit?  Hospitalized since your last visit?Yes When: 02/23/21-03/09/21 and 03/19/21-03/24/21 Where: [facility] Reason for visit: Bowel obstruction.    2. Have you seen or consulted any other health care providers outside of the [facility] since your last visit?  Include any pap smears or colon screening. No

## 2021-04-01 NOTE — Progress Notes (Signed)
 Follow up phone call to patient, two pt identifiers verified. Discussed patient's goals:    Goals        Completes all follow-up appointments      Educate and encourage importance of FU for prevention of complications or disease;  Assess the patient's relationship with a PCP and next FU visit scheduled;  Discuss importance of adherence to treatment plan and follow up visits;  Identify any barriers in transportation or access to FU appointments.   Assist patient with making FU appointments as needed;   It's also a good idea to know your test results.   Keep a list of the medicines you take.  11/28: Dr. Vicci 12/9 @ 1:30.  Patient declined my offer to call to make earlier appointment for her.  11/29: Still has appt on 12/9.  Discussed red flags.  She will call her surgeon's office if she develops a fever, increase in pain w/ meds, increased drainage/foul odor - any signs of infection.  Discussed NAL, patient states she has met her deductible so does not feel the need to call them, also has medicaid as secondary insurance. Let her know they are a good resource.  She is maintaining good fluid balance, appetite is back and having BMs regularly.  12/2: Follow up for post op on 12/9.  She will call if she feels that she is developing signs of infection.  States she is changing dressing 2 x day, and her daughter is helping her. Dressing has some brown drainage, nothing alarming.  If it begins to increase or smell bad, will call surgeon or [facility].  12/12: Scheduled f/up appt with Dr. Dillon for 12/14 for patient.  Reminded her to log blood pressure readings to provided to Dr. Olita.  12/15 Completed her 7 day f/up with PCP yesterday.       Demonstrates no return to ED or red flags      Assess patient knowledge of how to contact the provider for questions if needed;  Educate patient on importance of monitoring for any red flags;  Review importance of reporting any changes, "red flags" to the provider and/or  PCP;  Assess patient's knowledge of discharge instructions and any restrictions;  Educate patient when to call 911;  Assess for any barriers with safety at home  Discuss use of nurse access line and location of number on the back of insurance card.  11/28: ED visit on 11/25.  Patient had yellow drainage and a staple popped out.  Was told it was not infected.  K was 2.6, was provided with potassium chloride and changes made to her roxicodone .  Patient states she is hurting, slept for 7 hours and didn't take any pain medication.  Woke up in a lot of pain.  She took pill about 30 minutes ago.  Pain is getting better now.  Asked her if she wanted me to call her surgeon, she declined.  States she had a BM on 11/27 and early this morning (11/28).  Taking miralax .    Drainage yellow in color, crusted over, she rubbed it and it came off. Looked at her wound and wound is dry.  11/29: Taking miralax , going regularly.  Emotionally feels better.  Incision is covered with a dressing.  Pain is controlled with medicine.    12/2: Patient states she is tired, was up with her son overnight, as he was having issues with heartburn, went to bed around 4 am.  She is having regular BMs, feels antibiotic  is messing up her stomach, has 8 more days, advised her to take probiotics, states she likes yogurt.  Discussed risk for DVT, pneumonia.  Advised her to do ankle pumps and deep breathing exercises.  Provided phone number to [facility], she wrote the number down.    12/12: Using extra strength tyl during the day for pain, pain level is 4 of 10, when she is moving. Incision is healing nicely, not draining anymore, open to air.  Staples were removed in the hospital.  Patient states she will begin to check her BP, she had forgotten about that.   Encouraged her to check it a few times a day, log the information and take it with her to her appointment with Dr. Olita.  Discussed importance of maintaining well controlled blood  pressure  12/15: Has nauseous feeling, taking compazine , no complaints of pain, stools are soft, brother is picking up her medications for her at the pharmacy, states she ran out of diabetes testing materials.       Supportive resources in place to maintain patient in the community (ie. Home Health, DME equipment, refer to, medication assistant plan, etc.)      [facility] - # [phone]  [facility] # [phone] PW: [facility] for associates  Nurse Access line 24/7 # [phone]  [facility] ([email or web address])  [facility] [phone]  [facility] [phone]  [facility] - [place]   [facility] Workday  IT - [phone]  MyChart Help - 1- [phone]  Associate Services for advice and direction, by calling [phone] and pressing 1              Patient's primary care provider relationship reviewed with patient and modified, as applicable.    Readiness to Change: []   Pre-contemplative    []   Contemplative  []   Preparation               [x]   Action                  []   Maintenance    Barriers/Challenges to Care: []   Decline in memory    []   Language barrier     []   Emotional                  []   Limited mobility  []   Lack of motivation     []  Vision, hearing or cognitive impairment []   Knowledge []  Financial Barriers []   Lack of support  []   Pain []   Other [x]   None    Key pt activities to achieve better health:   [x]   Weight loss  [x]   Improved diabetic control  []   Decreased cholesterol levels  []   Decreased blood pressure  []     []     Upcoming appointments:   Future Appointments   Date Time Provider Department Center   06/29/2021  2:00 PM Dillon, Caitlin K, MD [facility]     Plan for next call:  Call on 12/19.

## 2021-04-05 NOTE — Progress Notes (Signed)
 Follow up phone call to patient, two pt identifiers verified. Discussed patient's goals:    Goals        Completes all follow-up appointments      Educate and encourage importance of FU for prevention of complications or disease;  Assess the patient's relationship with a PCP and next FU visit scheduled;  Discuss importance of adherence to treatment plan and follow up visits;  Identify any barriers in transportation or access to FU appointments.   Assist patient with making FU appointments as needed;   It's also a good idea to know your test results.   Keep a list of the medicines you take.  11/28: Dr. Vicci 12/9 @ 1:30.  Patient declined my offer to call to make earlier appointment for her.  11/29: Still has appt on 12/9.  Discussed red flags.  She will call her surgeon's office if she develops a fever, increase in pain w/ meds, increased drainage/foul odor - any signs of infection.  Discussed NAL, patient states she has met her deductible so does not feel the need to call them, also has medicaid as secondary insurance. Let her know they are a good resource.  She is maintaining good fluid balance, appetite is back and having BMs regularly.  12/2: Follow up for post op on 12/9.  She will call if she feels that she is developing signs of infection.  States she is changing dressing 2 x day, and her daughter is helping her. Dressing has some brown drainage, nothing alarming.  If it begins to increase or smell bad, will call surgeon or [facility].  12/12: Scheduled f/up appt with Dr. Dillon for 12/14 for patient.  Reminded her to log blood pressure readings to provided to Dr. Olita.  12/15 Completed her 7 day f/up with PCP yesterday.       Demonstrates no return to ED or red flags      Assess patient knowledge of how to contact the provider for questions if needed;  Educate patient on importance of monitoring for any red flags;  Review importance of reporting any changes, "red flags" to the provider and/or  PCP;  Assess patient's knowledge of discharge instructions and any restrictions;  Educate patient when to call 911;  Assess for any barriers with safety at home  Discuss use of nurse access line and location of number on the back of insurance card.  11/28: ED visit on 11/25.  Patient had yellow drainage and a staple popped out.  Was told it was not infected.  K was 2.6, was provided with potassium chloride and changes made to her roxicodone .  Patient states she is hurting, slept for 7 hours and didn't take any pain medication.  Woke up in a lot of pain.  She took pill about 30 minutes ago.  Pain is getting better now.  Asked her if she wanted me to call her surgeon, she declined.  States she had a BM on 11/27 and early this morning (11/28).  Taking miralax .    Drainage yellow in color, crusted over, she rubbed it and it came off. Looked at her wound and wound is dry.  11/29: Taking miralax , going regularly.  Emotionally feels better.  Incision is covered with a dressing.  Pain is controlled with medicine.    12/2: Patient states she is tired, was up with her son overnight, as he was having issues with heartburn, went to bed around 4 am.  She is having regular BMs, feels antibiotic  is messing up her stomach, has 8 more days, advised her to take probiotics, states she likes yogurt.  Discussed risk for DVT, pneumonia.  Advised her to do ankle pumps and deep breathing exercises.  Provided phone number to [facility], she wrote the number down.    12/12: Using extra strength tyl during the day for pain, pain level is 4 of 10, when she is moving. Incision is healing nicely, not draining anymore, open to air.  Staples were removed in the hospital.  Patient states she will begin to check her BP, she had forgotten about that.   Encouraged her to check it a few times a day, log the information and take it with her to her appointment with Dr. Olita.  Discussed importance of maintaining well controlled blood  pressure  12/15: Has nauseous feeling, taking compazine , no complaints of pain, stools are soft, brother is picking up her medications for her at the pharmacy, states she ran out of diabetes testing materials.  12/19: No red flags, still tender, but getting better every day.  Her brother picked up her medication for her, glucose is doing okay       Supportive resources in place to maintain patient in the community (ie. Home Health, DME equipment, refer to, medication assistant plan, etc.)      [facility] - # [phone]  [facility] # [phone] PW: [facility] for associates  Nurse Access line 24/7 # [phone]  [facility] ([email or web address])  [facility] [phone]  [facility] [phone]  [facility] Service Now - [place]   [facility] Workday  IT - [phone]  MyChart Help - 1- [phone]  Associate Services for advice and direction, by calling [phone] and pressing 1              Patient's primary care provider relationship reviewed with patient and modified, as applicable.    Patient states she is better.  Following gastroparesis diet.  Abdomen still tender but better every day.  States she has gotten her diabetic supplies that she had run out of.      Readiness to Change: []   Pre-contemplative    []   Contemplative  []   Preparation               [x]   Action                  []   Maintenance    Barriers/Challenges to Care: []   Decline in memory    []   Language barrier     []   Emotional                  []   Limited mobility  []   Lack of motivation     []  Vision, hearing or cognitive impairment []   Knowledge []  Financial Barriers []   Lack of support  []   Pain []   Other [x]   None    Key pt activities to achieve better health:   [x]   Weight loss  [x]   Improved diabetic control  []   Decreased cholesterol levels  []   Decreased blood pressure  []     []     Upcoming appointments:   Future Appointments   Date Time Provider Department Center   06/29/2021  2:00 PM Dillon, Caitlin  K, MD [facility]     Plan for next call:  Call on 12/22.

## 2021-04-08 NOTE — Progress Notes (Signed)
Attempt to reach patient for follow up. Discreet VM left with contact information.

## 2021-04-15 NOTE — Progress Notes (Signed)
Resolving current episode Transitions of care complete.  No further ED/UC or hospital admissions within 30 days post discharge. Patient attended follow-up appointments as directed.  No outreach from patient to [place].     Patient feels she is doing well. States she has f/up visit with her surgeon today.  No further needs.  Encouraged her to reach out to me should she need assistance.  She was appreciative.

## 2021-04-26 MED FILL — TRULICITY 4.5MG/0.5ML PEN: 4.5 MG/0.5ML | SUBCUTANEOUS | 84 days supply | Qty: 6 | Fill #2 | Status: AC

## 2021-05-05 ENCOUNTER — Encounter: Payer: BLUE CROSS/BLUE SHIELD | Attending: Rehabilitative and Restorative Service Providers" | Primary: Family Medicine

## 2021-05-05 ENCOUNTER — Inpatient Hospital Stay
Admit: 2021-05-05 | Payer: BLUE CROSS/BLUE SHIELD | Attending: Rehabilitative and Restorative Service Providers" | Primary: Family Medicine

## 2021-05-05 DIAGNOSIS — M6289 Other specified disorders of muscle: Secondary | ICD-10-CM

## 2021-05-05 NOTE — Other (Signed)
Therapy Evaluation by Mal Amabile, PT at 05/05/21 1400                Author: Mal Amabile, PT  Service: Physical Therapy  Author Type: Physical Therapist       Filed: 05/10/21 1400  Date of Service: 05/05/21 1400  Status: Signed          Editor: Mal Amabile, PT (Physical Therapist)               Physical Therapy at [facility],    a part of [facility]   [address] [place], [place]   [place], [place]   Phone: [phone]  Fax: [phone]      Plan of Care/ Statement of Necessity for Physical Therapy Services 2-15      Patient name: Alyssa [name]  DOB : 1981/01/28  Provider#: [id number]   Referral source: Arnetha Courser, MD       Medical/Treatment Diagnosis: Pelvic floor dysfunction in female [M62.89]      Prior Hospitalization: see medical history      Comorbidities: see eval   Prior Level of Function: see eval   Medications: Verified on Patient Summary List      Start of Care: 05/05/2021      Onset Date: 1+ months         The Plan of Care and following information is based on the information from the initial evaluation.   Assessment/ key information: Patient referred to pelvic PT for lumbo-pelvic dysfunction.     She presents with strength deficits, overactivity, poor coordination and awareness of the transverse abdominus and levator ani.  She will benefit from skilled PT to address these problems so that she can perform all ADLs at [facility].        Evaluation Complexity History MEDIUM  Complexity : 1-2 comorbidities / personal factors  will impact the outcome/ POC ; Examination MEDIUM Complexity : 3 Standardized tests and measures addressing body structure, function, activity  limitation and / or participation in recreation  ;Presentation MEDIUM Complexity : Evolving with changing characteristics  ; Clinical Decision Making MEDIUM Complexity : FOTO score of 26-74   Overall Complexity Rating: MEDIUM      Problem List: pain affecting function,  decrease ROM, decrease strength, edema affecting function, impaired gait/ balance, decrease ADL/ functional  abilitiies, decrease activity tolerance, and decrease flexibility/ joint mobility    Treatment Plan may include any combination of the following: Therapeutic exercise, Neuromuscular reeducation, Manual therapy, Therapeutic activity,  Self care/home management, Electric stim unattended , Gait training, Ultrasound, Mechanical traction, and Electric stim attended   Patient / Family readiness to learn indicated by: asking questions, trying to perform skills, and interest   Persons(s) to be included in education: patient (P)   Barriers to Learning/Limitations: None   Patient Goal (s): to reduce pain   Patient Self Reported Health Status: good   Rehabilitation Potential: fair      Short Term Goals: To be accomplished in 4 weeks:   1.  Pt will demo independence with HEP with no v.c. to allow for toileting transfers without assistance needed   2.  Pt will demo sitting tolerance to 30 min with 0/10 pn and no v.c. for form correction   3.  Pt will demo ambulation x 250 ft with min antalgia and no LOB with 0 level of assistance and 0 AD   4.  Pt will transfer sit to stand without  UE assistance to improve functional mobility in the home    Long Term Goals: To be accomplished in 12 weeks:   1.  Pt will demo independence with HEP at the time of D/C as well as LE and UE AROM WFL to allow for continued strength and endurance progression  for 0 level of assistance for ADL skills   2.  Pt will demo 48/56 or higher on BERG balance test to indicate decreased risk of falls   3.  Pt will demo amb x 500 ft with no pain and no LOB at the time of D/C   4.  Pt will demo all ADL/IADL/work activities without compensation or assistance needed as evidenced by UE and LE MMT a min of 4/5 as well as AROM all jts equal bilaterally and WFL   5.  Pt will negotiate 12 steps reciprocally without subjectively reported fear of falling or  demonstrated LOB to improve safety in mobility in the home.    Frequency / Duration: Patient to be seen 1-2 times per week for 12 weeks.      Patient/ Caregiver education and instruction: self care, activity modification, and exercises      [x]   Plan of care has been reviewed with PTA      Mal Amabile, PT 05/05/2021       ________________________________________________________________________      I certify that the above Therapy Services are being furnished while the patient is under my care. I agree with the treatment plan and certify that this therapy is necessary.      Physician's Signature:____________________  Date:____________Time: _________       Arnetha Courser, MD

## 2021-05-05 NOTE — Progress Notes (Signed)
 PT INITIAL EVALUATION NOTE 2-15    Patient Name: Alyssa [name]  Date:05/05/2021  DOB: Sep 05, 1980  [x]   Patient DOB Verified  Payor: BLUE CROSS / Plan: [employer] / Product Type: PPO /    In time:215  Out time:300  Total Treatment Time (min): 45  Visit #: 1     Treatment Area: Pelvic floor dysfunction in female [M62.89]    SUBJECTIVE  Pain Level (0-10 scale): 4/10  Any medication changes, allergies to medications, adverse drug reactions, diagnosis change, or new procedure performed?: []  No    [x]  Yes (see summary sheet for update)  Subjective:     Patient woke up with significant pain on Novermber 8,2022. She could not pass gas or BM that morning.  The pain hits her again later that morning, which caused vomiting and dyaphoresis.  She was able to drive herself to the ER.  Diagnosed with small bowel obstruction and transferred to [facility] for surgery.  Removed 4inches of small intestine due to scar adhesions from previous surgeries.  Replaced mesh for umbilical hernia repair.  Unable to perform laproscopically, 8-10inched surgical incision runs vertically along mid-abdomen.  In the hospital for 18 days, due to inability to tolerate solid foods without vomitting.  Went to ER a second time and was Dx with diabetic gastroparesis.  Now she is careful to maintain good sugar levels, and avoiding high fiberous foods (no nuts, no raw veggies, no fatty animal meat, limiting fried foods).  Not taking any medications for GI at this time.      Symptoms include nervey pain along incision.  Having abdominal pain with prolonged sitting (reclining is more comfortable).  Able to lie on side without pain, on back for 20 minutes.  Standing tolerance is 30 minutes before she experiences a pulling sensation.  She is out of work until February 2nd with work restrictions (TBD).  Unable to drive.  No lifting over 10lbs.  Limited housework ability.    PLOF: housework  Mechanism of Injury: recent surgery  Previous  Treatment/Compliance: n/a  PMHx/Surgical Hx: Colectomy in 2013, appendectomy in 2004, umbilical hernia repair 2018, partial hysterectomy 2019  Work Hx: FT at [employer]  Living Situation: lives with 2 children  Pt Goals: to reduce pain  Barriers: none  Motivation: yes  Substance use: none   Cognition: A & O x 4        OBJECTIVE/EXAMINATION  Posture:  wide bOS, abominal pannus  Other Observations:  significant keyloiding of abominal scar running vertically along midline of abdomen  Gait and Functional Mobility:  dec trunk rotaion  Palpation: TTP and hypersensitivity along scar        Lumbar AROM:  Cervical AROM:        R  L  R  L  Flexion    75        Extension   75        Side Bending   75  75      Rotation               LOWER QUARTER   MUSCLE STRENGTH  KEY       R  L  0 - No Contraction  L1, L2 Psoas  5  5  1  - Trace   L3 Quads  5  5  2  - Poor   L4 Tib Ant  5  5  3  - Fair    L5 EHL  5  5  4 - Good   S1 FHL  5  5  5  - Normal   S2 Hams  5  5            MMT: TrA: 2/5  Neurological: Reflexes / Sensations: wnl          [] redness - adverse reaction:         15 min Therapeutic Activity:  [x]   See flow sheet :   Rationale: increase ROM, increase strength, improve coordination, and increase proprioception  to improve the patient's ability to perform ADLs         15 min Manual Therapy:  scar mobility   Rationale: decrease pain, increase ROM, increase tissue extensibility, decrease edema , correct positional vertigo, and increase postural awareness  to improve the patient's ability to perform ADLs      With   []  TE   []  TA   []  Neuro   []  SC   []  other: Patient Education: [x]  Review HEP    []  Progressed/Changed HEP based on:   []  positioning   []  body mechanics   []  transfers   []  heat/ice application    []  other:        Other Objective/Functional Measures: FOTO Functional Measure: 2nd visit    Pain Level (0-10 scale) post treatment: 2      ASSESSMENT:      [x]   See Plan of Care      [name], PT 05/05/2021

## 2021-05-05 NOTE — Progress Notes (Signed)
Progress Notes by Mal Amabile, PT at 05/05/21 1400                Author: Mal Amabile, PT  Service: Physical Therapy  Author Type: Physical Therapist       Filed: 08/06/21 1416  Date of Service: 05/05/21 1400  Status: Signed          Editor: Mal Amabile, PT (Physical Therapist)               Physical Therapy at [facility],    a part of [facility]   [address] [place], [place]   [place], [place]   Phone: [phone]  Fax: [phone]      Discharge Summary  2-15      Patient name: Alyssa [name]  DOB : [birth year]  Provider#: [id number]   Referral source: Arnetha Courser, MD       Medical/Treatment Diagnosis: Pelvic floor dysfunction in female [M62.89]      Prior Hospitalization: see medical history      Comorbidities: See Plan of Care   Prior Level of Function:See Plan of Care   Medications: Verified on Patient Summary List      Start of Care: 05/05/21      Onset Date:6 months    Visits from Start of Care: 1     Missed Visits:  0   Reporting Period : 05/05/21 to         ASSESSMENT/SUMMARY OF CARE: Patient did not return for follow up, therefore unable to assess goals at this time.  Provided with HEP for ongoing symptom management.                  RECOMMENDATIONS:   [x] Discontinue therapy: [] Patient has reached or is progressing toward set goals       [x] Patient is non-compliant or has  abdicated       [] Due to lack of appreciable progress  towards set goals       [] Other      Mal Amabile, PT 08/06/2021

## 2021-05-06 ENCOUNTER — Encounter: Payer: BLUE CROSS/BLUE SHIELD | Primary: Family Medicine

## 2021-05-06 NOTE — Telephone Encounter (Signed)
2023 Annual Pharmacist Visit **Patient is [facility]**     Called patient to schedule 2023 yearly pharmacist appointment to discuss medications for Diabetes Management Program.     Spoke to patient and appointment scheduled for tomorrow at [place].   [place] free: [phone]      For Pharmacy Admin Tracking Only    Program: [place] in place: No  Recommendation Provided To: Patient/Caregiver: 1 via Telephone  Intervention Detail: Scheduled Appointment  Intervention Accepted By: Patient/Caregiver: 1  Gap Closed?: Yes  Time Spent (min): 10

## 2021-05-07 ENCOUNTER — Telehealth: Primary: Family Medicine

## 2021-05-07 NOTE — Telephone Encounter (Signed)
POPULATION HEALTH CLINICAL PHARMACY REVIEW - BE WELL WITH DIABETES  =================================================================  Alyssa [name] is a 41 y.o. female enrolled in the [facility] [facility] Program.    Two attempts made to reach patient by telephone for pharmacist appointment. Left voice message for patient to return clinician's phone call. Will prepare MyChart message and send to patient.    Caprice Beaver, PharmD, [place]  Department, toll free: [phone], option 3    For Pharmacy Admin Tracking Only    Program: [place] in place: No  Gap Closed?: No  Time Spent (min): 15

## 2021-05-13 ENCOUNTER — Encounter: Payer: BLUE CROSS/BLUE SHIELD | Attending: Sports Medicine | Primary: Family Medicine

## 2021-05-28 NOTE — Telephone Encounter (Signed)
Telephone  Encounter by Pieter Partridge at 05/28/21 1353                Author: Pieter Partridge  Service: --  Author Type: --       Filed: 05/28/21 1357  Encounter Date: 05/28/2021  Status: Signed          Editor: Pieter Partridge               Pt called in tears and in pain with her previous surgery incisions asking for an appt with Dr. Aleene Davidson. We unfortunately had no appts available today.    I advised that with her bowel surgery history and the amount of pain she was in (9/10) she should go to the ED asap. 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      JR

## 2021-05-28 NOTE — Telephone Encounter (Signed)
Telephone Encounter by Madelin Rear, MD at 05/28/21 1402                Author: Madelin Rear, MD  Service: --  Author Type: Physician       Filed: 05/28/21 1402  Encounter Date: 05/28/2021  Status: Signed          Editor: Madelin Rear, MD (Physician)               Agree if severe pain and hx surgery appropriate to be examined in er

## 2021-06-01 NOTE — Telephone Encounter (Signed)
Patient returned call in regards to missed appt. Rescheduled 06/07/2021 @ [place] Only    Program: [place] in place: No  Recommendation Provided To: Patient/Caregiver: 1 via Telephone  Intervention Detail: Scheduled Appointment  Intervention Accepted By: Patient/Caregiver: 1  Gap Closed?: Yes  Time Spent (min): 5

## 2021-06-07 ENCOUNTER — Encounter: Primary: Family Medicine

## 2021-06-07 NOTE — Telephone Encounter (Signed)
 POPULATION HEALTH CLINICAL PHARMACY REVIEW - Be Well with Diabetes    Alyssa [name] is a 41 y.o. female enrolled in the [facility] Employee Diabetes Program. Patient provided Clinical research associate with verbal consent to remain in the program for this year. Patient enrolled 2021.    Insurance through the following employer: [employer]    Medications:   Current Outpatient Medications   Medication Sig    flash glucose sensor (FreeStyle Libre 2 Sensor) kit Change sensor every 14 days    valACYclovir  (VALTREX ) 500 mg tablet Take 1 Tablet by mouth two (2) times a day.    lancets misc Use to test blood sugars once a day as directed    lisinopriL  (PRINIVIL , ZESTRIL ) 20 mg tablet Take 1 Tablet by mouth daily.    metoprolol  succinate (TOPROL -XL) 25 mg XL tablet Take 1 Tablet by mouth nightly.    rosuvastatin  (CRESTOR ) 20 mg tablet Take 1 Tablet by mouth nightly.    metFORMIN  ER (GLUCOPHAGE  XR) 500 mg tablet Take 4 Tablets by mouth daily (with dinner).    insulin  glargine U-300 conc (Toujeo  Max U-300 SoloStar) 300 unit/mL (3 mL) inpn 80 Units by SubCUTAneous route nightly. Indications: type 2 diabetes mellitus    methocarbamoL (ROBAXIN) 750 mg tablet Take 1 Tablet by mouth three (3) times daily as needed for Muscle Spasm(s). (Patient not taking: No sig reported)    ondansetron  (ZOFRAN  ODT) 4 mg disintegrating tablet Take 1 Tablet by mouth every eight (8) hours as needed for Nausea or Vomiting. (Patient not taking: No sig reported)    glucose blood VI test strips (Prodigy No Coding) strip Use to test blood sugars 4 times daily or as directed    omega 3-DHA-EPA-fish oil 1,000 mg (120 mg-180 mg) capsule Take 1 Capsule by mouth daily. (Patient not taking: No sig reported)    Insulin  Needles, Disposable, (Comfort EZ Pen Needles) 31 gauge x 5/16 ndle Leader brand pen needle to use once a day with basal insulin      No current facility-administered medications for this visit.     Current Pharmacy: [facility]  Current testing supplies/frequency:  lost prodigy meter, does want another one  is going to pick up freestyle libre 2 from [employer], discussed libre program for $75 for month and provider abbott phone number to patient  Pen needles/syringes: has leader    Allergies:  Allergies   Allergen Reactions    Betadine [Povidone-Iodine] Hives and Rash    Codeine Nausea and Vomiting    Contrast Dye [Iodine] Nausea and Vomiting    Curry Leaf-Tree Hives    Sulfa (Sulfonamide Antibiotics) Rash    Tramadol Itching      Vitals/Labs:  BP Readings from Last 3 Encounters:   03/26/21 (!) 155/91   03/16/21 (!) 156/99   03/12/21 (!) 179/87     Lab Results   Component Value Date/Time    Microalbumin/Creat ratio (mg/g creat) 34 (H) 03/27/2020 09:03 AM    Microalbumin,urine random 3.54 03/27/2020 09:03 AM     Lab Results   Component Value Date/Time    Hemoglobin A1c 8.0 (H) 03/20/2021 01:30 AM    Hemoglobin A1c 11.2 (H) 03/27/2020 09:03 AM    Hemoglobin A1c 8.4 (H) 10/14/2019 08:50 AM    Hemoglobin A1c (POC) 9.4 01/21/2021 12:11 PM    Hemoglobin A1c (POC) 10.2 07/29/2020 12:15 PM     Lab Results   Component Value Date/Time    Cholesterol, total 164 12/14/2020  10:15 AM    HDL Cholesterol 49 12/14/2020 10:15 AM    LDL, calculated 83.2 12/14/2020 10:15 AM    VLDL, calculated 25.4 03/27/2020 09:03 AM    Triglyceride 159 (H) 12/14/2020 10:15 AM    CHOL/HDL Ratio 2.4 03/27/2020 09:03 AM     ALT (SGPT)   Date Value Ref Range Status   03/19/2021 15 12 - 78 U/L Final     AST (SGOT)   Date Value Ref Range Status   03/19/2021 16 15 - 37 U/L Final     The 10-year ASCVD risk score (Arnett DK, et al., 2019) is: 11.8%    Values used to calculate the score:      Age: 39 years      Sex: Female      Is Non-Hispanic African American: Yes      Diabetic: Yes      Tobacco smoker: No      Systolic Blood Pressure: 155 mmHg      Is BP treated: Yes      HDL Cholesterol: 49 MG/DL      Total Cholesterol: 164 MG/DL     Lab Results   Component Value  Date/Time    Creatinine 0.66 03/26/2021 05:32 PM     Estimated Creatinine Clearance: 124.6 mL/min (by C-G formula based on SCr of 0.66 mg/dL).    Lab Results   Component Value Date/Time    GFR est non-AA >60 08/15/2020 09:21 AM    GFR est AA >60 08/15/2020 09:21 AM       Immunizations:  Immunization History   Administered Date(s) Administered    COVID-19, PFIZER PURPLE top, DILUTE for use, (age 82 y+), IM, 75mcg/0.3mL 09/07/2019, 09/28/2019, 02/11/2020    Influenza Vaccine 01/16/2018, 01/04/2019, 01/11/2020    Influenza, FLUARIX, FLULAVAL, FLUZONE (age 53 mo+) AND AFLURIA, (age 18 y+), PF, 0.27mL 02/01/2017    Influenza, FLUCELVAX, (age 53 mo+), MDCK, PF 01/04/2019    Pneumococcal Polysaccharide (PPSV-23) 03/24/2011    Tdap 06/02/2020      Social History:  Social History     Tobacco Use    Smoking status: Former     Packs/day: 0.50     Years: 18.00     Pack years: 9.00     Types: Cigarettes     Quit date: 06/21/2008     Years since quitting: 12.9    Smokeless tobacco: Never   Substance Use Topics    Alcohol use: No     ASSESSMENT:  Initial Program Requirements (Y indicates has completed for the year, N indicates needs to be completed by 10/16/2021):  NO - Provider Visit for DM (1st)  NO - A1c (1st)    Ongoing Program Requirements (Y indicates has completed for the year, N indicates needs to be completed by 04/17/2022):  NO - Provider Visit for DM (2nd)  NO - ACC/diabetes educator visit (if A1c over 8%)  NO - A1c (2nd)  NO - Lipid panel  NO - Urine microalbumin  NO - Pneumococcal vaccination: may get Prevnar20  NO - Influenza vaccination for Fall 2023  NO - Medication adherence over 70%  YES - On statin - rosuvastatin   YES - On ACEi/ARB - lisinopril      Current medications eligible for copay waiver, up to $600, through [facility]:  - trulicity , lantus , lisinopril , metformin , rosuvastatin   - Prodigy and Freestyle Libre     Diabetes Care:   - Glycemic Goal: <7.0% and directed by provider. Is not  at  blood glucose goal but is on insulin  therapy. Type 2 DM under inadequate control as evidenced by 8.0.  - Current symptoms/problems include  none  - Home blood sugar records:  fasting range: 110-118, does not do any other checks  - Any episodes of hypoglycemia? no  - Known diabetic complications: none  - Eye exam current (within one year): yes  - Foot exam current (within one year): yes  - Appropriateness of Insulin  Therapy: toujeo  per pcp  - Therapy Optimization: cont to titrate lantus   - Medication changes since last A1c: no  - Medications/Classes already tried/failed:lantus  changed to toujeo - lantus  was not controlling sugars as much. Use to be on glipizide  (hypoglycemia- even had to call ambulance). Jardiance (yeast infections)  - Medication compliance: compliant most of the time, trying her best not to miss. She is going to nursing school and sometimes does fall asleep but did set alarms on her phone so this has been better. Takes all meds at night  - Diet compliance: compliant most of the time. She did have emergency surgery last year and got diagnosed with gastroparesis. Has to avoid things with little seeds. List said she has to avoid olives as well but she may try some. Has to follow gastroparesis diet.   -  history of laparoscopic appendectomy, C-sections x2, colectomy, laparoscopic hysterectomy, tubal ligation, right salpingo-oophorectomy who had an incisional hernia repair status post with lysis of adhesions and small bowel resection x2 for small bowel obstruction on 02/26/2021 by Dr. Dorise Louder. Has gastric emptying test which was positive but she is on trulicity . Patient states she is tolerating trulicity  and no doctors said anything about the trulicity  and her gastroparesis   - Current exercise:  is trying to do what she can    Other Considerations:  - Blood Pressure Goal: BP less than 130/80 mmHg due to history of DM: Is not at blood pressure goal but was post surgery and was recovering. Patient is  not sure what BP is..   - Lipids:  tolerating rosuvastatin  20 mg  - Smoking status:  former    PLAN:  - Consideration(s) for provider:   Mychart names of fish oil we cover for patient to ask pcp. I also mentioned to pcp  Prodigy for back up meter  Ask pcp about trulicity - if ok to continue to take due to gastroparesis history and recent surgery. Also asked patient to ask GI since not in epic.   [facility]ill libre 2 at [facility]  Discussed libre 3  Discussed be well program to get other $600 on chard snyder so can start the year with $1200. Patient was not aware of this  Discussed dexcom pilot but not up and running yet  - DM program gaps identified:   Initial requirements: Provider visit with provider for DM (1st) and A1c (1st)   Ongoing requirements: Provider visit for DM (2nd), ACC/diabetes educator visit (if A1c over 8%), A1c (2nd), Lipid panel, Urine microalbumin, Pneumococcal vaccination: Prevnar20, Influenza vaccination for 2023-2024, and Medication adherence over 70%   - Education to patient: Discussed general issues about diabetes pathophysiology and management., Addressed diet and exercise, and Reminded to get yearly retinal exam.   - Follow up: PCP for identified gaps or as scheduled below  - Upcoming appointments:   Future Appointments   Date Time Provider Department Center   06/29/2021  2:00 PM Dillon, Caitlin K, MD [facility]       [name], PharmD, BCPS  Population  Health Pharmacy  [facility] Clinical Pharmacist  Department: [phone]    For Pharmacy Admin Tracking Only    Program: Pop Health  CPA in place: No  Recommendation Provided To: Provider: 3 via Note to Provider , Patient/Caregiver: 1 via Telephone, and Pharmacy: 1  Intervention Detail: New Rx: 1, reason: Cost/Formulary Change and Refill(s) Provided  Intervention Accepted By: Provider: 3, Patient/Caregiver: 1, and Pharmacy: 1  Gap Closed?: Yes  Time Spent (min): 60

## 2021-06-07 NOTE — Telephone Encounter (Signed)
 Dr. Dillon, Caitlin K, MD,    Your patient is currently enrolled in the Be Well with Diabetes program. After your patient's recent visit with a [facility] Clinical Pharmacist, the below were identified as opportunities to assist with their diabetes management:   Patient with history of gastroparesis. I wanted to check your thoughts on patient continuing to take trulicity  since this medication can increase the risk of gastroparesis. Per the manufacturer, they state, Use of Trulicity  may be associated with gastrointestinal adverse reactions, sometimes severe. Trulicity  has not been studied in patients with severe gastrointestinal disease, including severe gastroparesis, and is therefore not recommended in these patients  Also, prescription fish oil is now covered under the diabetic program, Lovaza or vascepa, are both covered. Patient was taking OTC fish oil but it was getting costly. Can [facility] prescription for either [facility] if you agree    See pharmacist note dated 06/07/21 for complete details.     To remain active in the program, the patient is to meet the requirements and documentation must be provided by pre-established deadlines (see below).       Program Requirements  Initial Program Requirements (to be completed by 10/16/2021):  Office visit with provider for DM (1st)  A1c (1st)    Ongoing Program Requirements (to be completed by 04/17/2022):  Office visit with provider for DM (2nd)  A1c (2nd)  Diabetes Education if A1c >8%  Lipid panel  Urine microalbumin   Pneumococcal vaccination (if applicable)  Influenza vaccination for Fall 2023  On statin or contraindication  On ACEi/ARB or contraindication    Thank you,  [name], PharmD, BCPS  Population Health Pharmacy  [facility] Clinical Pharmacist  Department: [phone]

## 2021-06-07 NOTE — Telephone Encounter (Signed)
 [name], Alyssa K, MD  - Patient states she misplaced her meter  Scripts pended for new meter and supplies    Thanks,  [name], PharmD, BCPS  Population Health Pharmacy  [facility] Clinical Pharmacist  Department: [phone]

## 2021-06-08 MED ORDER — BLOOD-GLUCOSE METER
0 refills | Status: AC
Start: 2021-06-08 — End: ?

## 2021-06-08 MED ORDER — ALCOHOL SWABS
MEDICATED_PAD | CUTANEOUS | 3 refills | Status: AC
Start: 2021-06-08 — End: ?
  Filled 2021-06-11: qty 100, 90d supply, fill #0

## 2021-06-08 MED ORDER — PRODIGY NO CODING STRIPS
ORAL_STRIP | 3 refills | Status: AC
Start: 2021-06-08 — End: ?

## 2021-06-08 MED ORDER — LANCETS 28 GAUGE
28 gauge | 3 refills | Status: AC
Start: 2021-06-08 — End: ?

## 2021-06-10 MED FILL — FREESTYLE LIBRE 2 SENSOR  MISC: 84 days supply | Qty: 6 | Fill #1 | Status: AC

## 2021-06-11 MED FILL — PRODIGY AUTOCODE BLOOD W/DEVICE KIT: 30 days supply | Qty: 1 | Fill #0 | Status: AC

## 2021-06-11 MED FILL — PRODIGY TWIST TOP LANCETS 28  MISC: 90 days supply | Qty: 100 | Fill #0 | Status: AC

## 2021-06-11 MED FILL — PRODIGY NO CODING BLOOD GLUC  STRP: 90 days supply | Qty: 100 | Fill #0 | Status: AC

## 2021-06-19 ENCOUNTER — Emergency Department: Admit: 2021-06-19 | Payer: BLUE CROSS/BLUE SHIELD | Primary: Family Medicine

## 2021-06-19 ENCOUNTER — Inpatient Hospital Stay
Admit: 2021-06-19 | Discharge: 2021-06-19 | Disposition: A | Discharge status: Home / Self Care | Payer: BLUE CROSS/BLUE SHIELD | Attending: Emergency Medicine

## 2021-06-19 ENCOUNTER — Emergency Department: Payer: BLUE CROSS/BLUE SHIELD | Primary: Family Medicine

## 2021-06-19 DIAGNOSIS — R1013 Epigastric pain: Secondary | ICD-10-CM

## 2021-06-19 LAB — CBC WITH AUTO DIFFERENTIAL
Basophils %: 1 % (ref 0–1)
Basophils Absolute: 0.1 10*3/uL (ref 0.0–0.1)
Eosinophils %: 1 % (ref 0–7)
Eosinophils Absolute: 0.1 10*3/uL (ref 0.0–0.4)
Granulocyte Absolute Count: 0 10*3/uL (ref 0.00–0.04)
Hematocrit: 33.2 % — ABNORMAL LOW (ref 35.0–47.0)
Hemoglobin: 10.8 g/dL — ABNORMAL LOW (ref 11.5–16.0)
Immature Granulocytes: 0 % (ref 0.0–0.5)
Lymphocytes %: 36 % (ref 12–49)
Lymphocytes Absolute: 3.8 10*3/uL — ABNORMAL HIGH (ref 0.8–3.5)
MCH: 25.8 PG — ABNORMAL LOW (ref 26.0–34.0)
MCHC: 32.5 g/dL (ref 30.0–36.5)
MCV: 79.2 FL — ABNORMAL LOW (ref 80.0–99.0)
MPV: 11.1 FL (ref 8.9–12.9)
Monocytes %: 6 % (ref 5–13)
Monocytes Absolute: 0.6 10*3/uL (ref 0.0–1.0)
NRBC Absolute: 0 10*3/uL (ref 0.00–0.01)
Neutrophils %: 56 % (ref 32–75)
Neutrophils Absolute: 5.8 10*3/uL (ref 1.8–8.0)
Nucleated RBCs: 0 PER 100 WBC
Platelets: 492 10*3/uL — ABNORMAL HIGH (ref 150–400)
RBC: 4.19 M/uL (ref 3.80–5.20)
RDW: 15.7 % — ABNORMAL HIGH (ref 11.5–14.5)
WBC: 10.4 10*3/uL (ref 3.6–11.0)

## 2021-06-19 LAB — COMPREHENSIVE METABOLIC PANEL
ALT: 18 U/L (ref 12–78)
AST: 9 U/L — ABNORMAL LOW (ref 15–37)
Albumin/Globulin Ratio: 0.8 — ABNORMAL LOW (ref 1.1–2.2)
Albumin: 3.7 g/dL (ref 3.5–5.0)
Alkaline Phosphatase: 52 U/L (ref 45–117)
Anion Gap: 6 mmol/L (ref 5–15)
BUN: 11 MG/DL (ref 6–20)
Bun/Cre Ratio: 14 (ref 12–20)
CO2: 25 mmol/L (ref 21–32)
Calcium: 9.1 MG/DL (ref 8.5–10.1)
Chloride: 107 mmol/L (ref 97–108)
Creatinine: 0.78 MG/DL (ref 0.55–1.02)
ESTIMATED GLOMERULAR FILTRATION RATE: >60 mL/min/{1.73_m2} (ref >60)
Globulin: 4.6 g/dL — ABNORMAL HIGH (ref 2.0–4.0)
Glucose: 166 mg/dL — ABNORMAL HIGH (ref 65–100)
Potassium: 3.3 mmol/L — ABNORMAL LOW (ref 3.5–5.1)
Sodium: 138 mmol/L (ref 136–145)
Total Bilirubin: 0.4 MG/DL (ref 0.2–1.0)
Total Protein: 8.3 g/dL — ABNORMAL HIGH (ref 6.4–8.2)

## 2021-06-19 LAB — LIPASE
Lipase: 246 U/L (ref 73–393)
Lipase: 246 U/L (ref 73–393)

## 2021-06-19 LAB — METABOLIC PANEL, COMPREHENSIVE
A-G Ratio: 0.8 — ABNORMAL LOW (ref 1.1–2.2)
ALT (SGPT): 18 U/L (ref 12–78)
AST (SGOT): 9 U/L — ABNORMAL LOW (ref 15–37)
Albumin: 3.7 g/dL (ref 3.5–5.0)
Alk. phosphatase: 52 U/L (ref 45–117)
Anion gap: 6 mmol/L (ref 5–15)
BUN/Creatinine ratio: 14 (ref 12–20)
BUN: 11 MG/DL (ref 6–20)
Bilirubin, total: 0.4 MG/DL (ref 0.2–1.0)
CO2: 25 mmol/L (ref 21–32)
Calcium: 9.1 MG/DL (ref 8.5–10.1)
Chloride: 107 mmol/L (ref 97–108)
Creatinine: 0.78 MG/DL (ref 0.55–1.02)
Globulin: 4.6 g/dL — ABNORMAL HIGH (ref 2.0–4.0)
Glucose: 166 mg/dL — ABNORMAL HIGH (ref 65–100)
Potassium: 3.3 mmol/L — ABNORMAL LOW (ref 3.5–5.1)
Protein, total: 8.3 g/dL — ABNORMAL HIGH (ref 6.4–8.2)
Sodium: 138 mmol/L (ref 136–145)
eGFR: >60 mL/min/{1.73_m2} (ref >60)

## 2021-06-19 LAB — CBC WITH AUTOMATED DIFF
ABS. BASOPHILS: 0.1 10*3/uL (ref 0.0–0.1)
ABS. EOSINOPHILS: 0.1 10*3/uL (ref 0.0–0.4)
ABS. IMM. GRANS.: 0 10*3/uL (ref 0.00–0.04)
ABS. LYMPHOCYTES: 3.8 10*3/uL — ABNORMAL HIGH (ref 0.8–3.5)
ABS. MONOCYTES: 0.6 10*3/uL (ref 0.0–1.0)
ABS. NEUTROPHILS: 5.8 10*3/uL (ref 1.8–8.0)
ABSOLUTE NRBC: 0 10*3/uL (ref 0.00–0.01)
BASOPHILS: 1 % (ref 0–1)
EOSINOPHILS: 1 % (ref 0–7)
HCT: 33.2 % — ABNORMAL LOW (ref 35.0–47.0)
HGB: 10.8 g/dL — ABNORMAL LOW (ref 11.5–16.0)
IMMATURE GRANULOCYTES: 0 % (ref 0.0–0.5)
LYMPHOCYTES: 36 % (ref 12–49)
MCH: 25.8 PG — ABNORMAL LOW (ref 26.0–34.0)
MCHC: 32.5 g/dL (ref 30.0–36.5)
MCV: 79.2 FL — ABNORMAL LOW (ref 80.0–99.0)
MONOCYTES: 6 % (ref 5–13)
MPV: 11.1 FL (ref 8.9–12.9)
NEUTROPHILS: 56 % (ref 32–75)
NRBC: 0 PER 100 WBC
PLATELET: 492 10*3/uL — ABNORMAL HIGH (ref 150–400)
RBC: 4.19 M/uL (ref 3.80–5.20)
RDW: 15.7 % — ABNORMAL HIGH (ref 11.5–14.5)
WBC: 10.4 10*3/uL (ref 3.6–11.0)

## 2021-06-19 LAB — SAMPLES BEING HELD

## 2021-06-19 MED ORDER — LIDOCAINE 2 % MUCOSAL SOLN
2 % | Freq: Once | Status: AC
Start: 2021-06-19 — End: 2021-06-19
  Administered 2021-06-19: 09:00:00 via ORAL

## 2021-06-19 MED FILL — MAG-AL PLUS 200 MG-200 MG-20 MG/5 ML ORAL SUSPENSION: 200-200-20 mg/5 mL | ORAL | Qty: 30

## 2021-06-19 NOTE — ED Notes (Signed)
 Pt brought in by EMS for upper abd pain, pt. Had SB resection in November, pt. States last 3 weeks has noted upper abd over the last 3 weeks, pt. States tonight has had increase pain tonight like something ripping in her upper abd. Per EMS took BP on both arms and noted a 50 point difference between arms during ride here.
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
## 2021-06-19 NOTE — ED Notes (Signed)
Pt has called her mother and she is on her way to pick pt up

## 2021-06-19 NOTE — ED Notes (Signed)
Pt discharged to home in nad, states understanding of dc instructions and followup

## 2021-06-21 NOTE — Progress Notes (Signed)
 Patient on report as eligible for Case Management.  Left discreet message on voicemail with this CM contact information.  Will attempt to contact again to offer [facility] Employee Care Management services.     Per chart review, patient had an ED visit to [facility] on 3/4 due to abdominal pain.

## 2021-06-22 NOTE — Telephone Encounter (Signed)
 Dillon, Caitlin K, MD  - looks like patient ended up in ER again with epigastric pain. I had messaged about the safety of trulicity  in this patient to get your thoughts.     Thanks   [name], PharmD, BCPS  Population Health Pharmacy  [facility] Clinical Pharmacist  Department: [phone]

## 2021-06-28 MED FILL — TOUJEO MAX SOLOSTAR 300UN/ML (6ML=1 BOX): 300 [IU]/mL | 90 days supply | Qty: 24 | Fill #2 | Status: AC

## 2021-06-29 ENCOUNTER — Encounter: Attending: Family Medicine | Primary: Family Medicine

## 2021-06-29 NOTE — Progress Notes (Signed)
Patient identified as eligible for [employer] Employee Care Management services.  Third telephone outreach attempted.  All circuits busy message again.  Sent UTR letter on 06/22/21.  Per chart review, patient has visit with her PCP scheduled for today.

## 2021-07-05 NOTE — Telephone Encounter (Signed)
 POPULATION HEALTH CLINICAL PHARMACY REVIEW - BE WELL WITH DIABETES: HIGH A1C  =============================================  Alyssa [name] is a 41 y.o. female enrolled in the [facility] Be Well with Diabetes program.    Identified care gap(s): A1c > 8%    DM Program Prescriptions:  Current Outpatient Medications   Medication Sig Dispense Refill    glucose blood VI test strips (Prodigy No Coding) strip Use to test blood sugars once daily or as directed 100 Strip 3    Blood-Glucose Meter (Prodigy Autocode Monitor Syst) misc Use to test blood sugars as directed 1 Each 0    lancets (Prodigy Lancets) 28 gauge misc Use to test blood sugars once daily or as directed 100 Lancet 3    alcohol swabs padm Use to test blood sugars once a day or as directed 100 Pad 3    dulaglutide  (Trulicity ) 4.5 mg/0.5 mL pnij 4.5 mg by SubCUTAneous route every seven (7) days.      flash glucose sensor (FreeStyle Libre 2 Sensor) kit Change sensor every 14 days 6 Kit 3    valACYclovir  (VALTREX ) 500 mg tablet Take 1 Tablet by mouth two (2) times a day. 180 Tablet 0    lisinopriL  (PRINIVIL , ZESTRIL ) 20 mg tablet Take 1 Tablet by mouth daily. 30 Tablet 3    metoprolol  succinate (TOPROL -XL) 25 mg XL tablet Take 1 Tablet by mouth nightly. (Patient taking differently: Take 25 mg by mouth daily as needed (for high heart rate).) 30 Tablet 3    rosuvastatin  (CRESTOR ) 20 mg tablet Take 1 Tablet by mouth nightly. 30 Tablet 11    metFORMIN  ER (GLUCOPHAGE  XR) 500 mg tablet Take 4 Tablets by mouth daily (with dinner). 360 Tablet 3    insulin  glargine U-300 conc (Toujeo  Max U-300 SoloStar) 300 unit/mL (3 mL) inpn 80 Units by SubCUTAneous route nightly. Indications: type 2 diabetes mellitus 24 mL 3    Insulin  Needles, Disposable, (Comfort EZ Pen Needles) 31 gauge x 5/16 ndle Leader brand pen needle to use once a day with basal insulin  100 Package 3        Allergies:  Allergies   Allergen Reactions    Betadine [Povidone-Iodine] Hives and Rash     Codeine Nausea and Vomiting    Contrast Dye [Iodine] Nausea and Vomiting    Curry Leaf-Tree Hives    Sulfa (Sulfonamide Antibiotics) Rash    Tramadol Itching        Labs:  Lab Results   Component Value Date/Time    Hemoglobin A1c 8.0 (H) 03/20/2021 01:30 AM    Hemoglobin A1c 11.2 (H) 03/27/2020 09:03 AM    Hemoglobin A1c 8.4 (H) 10/14/2019 08:50 AM    Hemoglobin A1c (POC) 9.4 01/21/2021 12:11 PM    Hemoglobin A1c (POC) 10.2 07/29/2020 12:15 PM     Estimated Creatinine Clearance: 111.8 mL/min (by C-G formula based on SCr of 0.78 mg/dL).    Care Team:   Physician (PCP): Olita (Last OV: missed visit)  ACC/RD: Lelon (Last Encounter: missed calls in March)  - Upcoming appointments:   No future appointments.      Diabetes Care:  - Glycemic Goal: <7.0% and directed by provider. Is not at blood glucose goal ..  - Appropriateness of Insulin  Therapy: toujeo  80 units at night  - Therapy Optimization: maybe could try actos.   - Medication changes since last A1c: none  - Medication Adherence Assessment: misses at times    Plan:    Reached patient for review.  Is still taking trulicity .     She states the ER doctor told her she pushed herself too hard at the gym, usually walks 1 mile and had walked 2.5 that day. Is walking couple times a week.     Last couple days she states she is feeling good.     Trying to check sugars TID- 20 min ago it was 155. Avg 148-163. Is wearing her libre.     Still doing toujeo  80 units at night.     Did miss her meds yesterday. She takes all meds at night. She use to take her meds in the morning but it caused daytime sleepiness. States she does use pill box and does use pill alarm. So does not usually miss anymore but she is still in school and sometimes falls asleep before taking meds at night.     Educated patient I still have concerns about her trulicity  and gastroparesis. Patient states she does not get symptoms or side effects when she does her injection. Last ER visit she was told she over  exerted herself.     Educated patient to please call PCP since she missed her appt. Patient states she will call and re-schedule.     [name], PharmD, BCPS  Population Health Pharmacy  [facility] Clinical Pharmacist  Department: [phone]     For Pharmacy Admin Tracking Only    Program: Pop Health  CPA in place: No  Recommendation Provided To: Provider: 1 via Note to Provider   Intervention Accepted By: Provider: 1  Gap Closed?: Yes  Time Spent (min): 20

## 2021-07-15 MED FILL — METFORMIN HCL ER 500MG TB24: 500 mg | 90 days supply | Qty: 360 | Fill #1 | Status: AC

## 2021-07-15 MED FILL — TRULICITY 4.5MG/0.5ML PEN: 4.5 MG/0.5ML | SUBCUTANEOUS | 84 days supply | Qty: 6 | Fill #3 | Status: AC

## 2021-07-19 MED ORDER — INSULIN NEEDLES (DISPOSABLE) 31 X 5/16"
31 gauge x 5/16" | 11 refills | Status: DC
Start: 2021-07-19 — End: 2021-07-20

## 2021-07-20 MED ORDER — INSULIN NEEDLES (DISPOSABLE) 31 X 5/16"
31 gauge x 5/16" | 11 refills | Status: AC
Start: 2021-07-20 — End: ?

## 2021-07-20 NOTE — Telephone Encounter (Signed)
Faxed request sent from [place]

## 2021-07-21 MED FILL — B-D UF PEN NEED 5/16"" 31G (8MM): 90 days supply | Qty: 300 | Fill #0 | Status: AC

## 2021-07-24 ENCOUNTER — Inpatient Hospital Stay
Admit: 2021-07-24 | Discharge: 2021-07-24 | Disposition: A | Discharge status: Home / Self Care | Payer: BLUE CROSS/BLUE SHIELD | Attending: Emergency Medicine

## 2021-07-24 DIAGNOSIS — R519 Headache, unspecified: Secondary | ICD-10-CM

## 2021-07-24 MED ORDER — LISINOPRIL 20 MG TAB
20 mg | ORAL_TABLET | Freq: Every day | ORAL | 3 refills | Status: AC
Start: 2021-07-24 — End: ?

## 2021-07-24 MED ORDER — ACETAMINOPHEN 500 MG TAB
500 mg | Freq: Once | ORAL | Status: AC
Start: 2021-07-24 — End: 2021-07-24
  Administered 2021-07-24: 17:00:00 via ORAL

## 2021-07-24 MED FILL — ACETAMINOPHEN 500 MG TAB: 500 mg | ORAL | Qty: 2

## 2021-07-24 NOTE — ED Notes (Signed)
Patient arrives ambulatory to the ED with complaints of hypertension and headache. 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 She also endorses a headache. She takes anti-hypertensives but has not taken them in 5 days because she ran out and has not had time to fill them.
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
## 2021-07-24 NOTE — ED Notes (Signed)
I have reviewed discharge instructions with the patient.  The patient verbalized understanding. Copy of discharge instructions provided. Encouraged patient to pick up her blood pressure medicine as soon as possible. Patient ambulated out of ED with a steady gait.

## 2021-07-26 NOTE — Progress Notes (Signed)
CCM progress note    Patient eligible for [place] care management    Received notification of discharge from [facility] on 07/24/21 for HTN & headache.      Contacted patient to discuss post discharge needs and offer care management services. Two patient identifiers verified. Discussed the care management program.  Patient agrees to care management services at this time.       Patient's primary care provider relationship reviewed with patient and modified, as applicable.    Care management assessment completed:    Patient has significant diagnosis of hypertension and diabetes, type II.     Cardiac Condition Focused Assessment  Skin- any open wounds or incisions? no  Description of wound- n/a  Edema or swelling? no   If yes, where? N/a  Change in weight >3 lbs in one day, or > 10lbs without explanation? no   If yes, was this reported to provider? N/a  Weight at discharge in lbs 263 lbs    Patient reported vital signs and important numbers to know:  Last BP 169/111   Pulse for 60 seconds 94   Temp 97.7   Pain 0-10 4 of 10   Location/pain characteristics headache   Last daily weight in lbs 263 lbs     Activity level- moving several times a day, or as recommended? yes   Abnormal activity level reported: n/a  Nutrition- prescribed diet? yes    Diet type: Diabetic Diet  Last reported BM: 07/26/21  Urinating regularly? yes  clear, yellow? yes   Abnormal urine characteristics reported: none  Was aspirin or other anticoagulant prescribed? no   Betablocker? yes   ACE/ARB? no   Is patient on anticoagulant therapy? no   If so, any needed lab monitoring needed, but not ordered? no    Red Flags:  Call 911 anytime you think you may need emergency care. For example, call if:  You have signs of a stroke. These may include:  Sudden numbness, paralysis, or weakness in your face, arm, or leg, especially on only one side of your body.  Sudden vision changes.  Sudden trouble speaking.  Sudden confusion or trouble understanding simple  statements.  Sudden problems with walking or balance.  A sudden, severe headache that is different from past headaches.  Call your doctor now or seek immediate medical care if:  You have a new or worse headache.  Your headache gets much worse.    Medications:  New Medications at Discharge: lisinopril 20 mg daily  Changed Medications at Discharge: none  Discontinued Medications at Discharge: none  Current Outpatient Medications   Medication Sig    lisinopriL (PRINIVIL, ZESTRIL) 20 mg tablet Take 1 Tablet by mouth daily.    Insulin Needles, Disposable, (BD Ultra-Fine Short Pen Needle) 31 gauge x 5/16" ndle USE WITH LANTUS SOLOSTAR PEN    glucose blood VI test strips (Prodigy No Coding) strip Use to test blood sugars once daily or as directed    Blood-Glucose Meter (Prodigy Autocode Monitor Syst) misc Use to test blood sugars as directed    lancets (Prodigy Lancets) 28 gauge misc Use to test blood sugars once daily or as directed    alcohol swabs padm Use to test blood sugars once a day or as directed    dulaglutide (Trulicity) 4.5 VO/5.3 mL pnij 4.5 mg by SubCUTAneous route every seven (7) days.    flash glucose sensor (FreeStyle Libre 2 Sensor) kit Change sensor every 14 days    valACYclovir (VALTREX)  500 mg tablet Take 1 Tablet by mouth two (2) times a day.    metoprolol succinate (TOPROL-XL) 25 mg XL tablet Take 1 Tablet by mouth nightly. (Patient taking differently: Take 25 mg by mouth daily as needed (for high heart rate).)    rosuvastatin (CRESTOR) 20 mg tablet Take 1 Tablet by mouth nightly.    metFORMIN ER (GLUCOPHAGE XR) 500 mg tablet Take 4 Tablets by mouth daily (with dinner).    insulin glargine U-300 conc (Toujeo Max U-300 SoloStar) 300 unit/mL (3 mL) inpn 80 Units by SubCUTAneous route nightly. Indications: type 2 diabetes mellitus     No current facility-administered medications for this visit.       Performed medication reconciliation with patient, and patient verbalizes understanding of administration  of home medications.  There were no barriers to obtaining medications identified at this time.    Barriers/Support system:  patient, mother, son, and daughter, aunt    Barriers/Challenges to Care: '[]'   Decline in memory    '[]'   Language barrier     '[]'   Emotional                  '[]'   Limited mobility  '[]'   Lack of motivation     '[]'  Vision, hearing or cognitive impairment '[]'   Knowledge '[]'  Financial Barriers '[]'   Lack of support  '[]'   Pain '[]'   Other '[x]'   None    CM Identified  Problems   1. Potential Learning Need  2. Maintain BP w/in normal limits (below 120/80)      PCP/Specialist follow up: Patient scheduled to follow up with Madelin Rear, MD on 08/03/21 (Dr. Patsey Berthold).   Future Appointments   Date Time Provider [place]   08/03/2021  9:00 AM Thedora Hinders, MD [facility] AMB      Reviewed red flags with patient, and patient verbalizes understanding.  Patient given an opportunity to ask questions. No other clinical/social/functional needs noted.   The patient agrees to contact the PCP office for questions related to their healthcare.  The patient expressed thanks, offered no additional questions and ended the call.      Plan for next call: Call in one week, 4/17.

## 2021-08-02 NOTE — Progress Notes (Signed)
Attempt to reach patient for follow up. Unable to leave voicemail message due to "all circuits busy" message. Will attempt another outreach in a week.    Per chart review, patient does have an appointment with Dr. Patsey Berthold on 08/03/21.

## 2021-08-03 ENCOUNTER — Ambulatory Visit: Payer: BLUE CROSS/BLUE SHIELD | Attending: Family Medicine | Primary: Family Medicine

## 2021-08-03 NOTE — Progress Notes (Signed)
Progress  Notes by Thedora Hinders, MD at 08/03/21 0900                Author: Thedora Hinders, MD  Service: --  Author Type: Physician       Filed: 08/05/21 1720  Encounter Date: 08/03/2021  Status: Signed          Editor: Thedora Hinders, MD (Physician)               This encounter was created in error - please disregard.

## 2021-08-11 NOTE — Progress Notes (Signed)
Resolving current episode of case management.  Patient has met patient stated and/or medical goals.      Patient consistenly demonstrates understanding of the medical action plan through execution of plan. Appointments with key providers are scheduled and attended. Plan of care is modified and updated to address new challenges and barriers with minimal support from the CM team(proactively uses physicians and community resources) The support system remains current and has been modified as needed.Patient continues to acquire needed resources from the current support system established. Teach back demonstrates that patient understands education for self management of chronic conditions. Patient consistenly communicates understanding of signs,symptoms and complications for all major diagnoses.   Patient modifies his/her lifestyle toreduce or avoid risk factors to his/her health.     ECM will follow as needed and patient has ECM contact information for future needs.     Patient states she is doing well.  Expects to receive her new blood pressure cuff in the mail.  Her classes are going well.  Would like to have her episode closed.  Encouraged her to reach out to me should she need any assistance in the future.  She was appreciative.

## 2021-08-12 ENCOUNTER — Ambulatory Visit: Attending: Family Medicine | Primary: Family Medicine

## 2021-08-12 ENCOUNTER — Ambulatory Visit
Admit: 2021-08-12 | Discharge: 2021-08-12 | Payer: BLUE CROSS/BLUE SHIELD | Attending: Family Medicine | Primary: Family Medicine

## 2021-08-12 DIAGNOSIS — Z0001 Encounter for general adult medical examination with abnormal findings: Secondary | ICD-10-CM

## 2021-08-12 MED ORDER — ROSUVASTATIN 20 MG TAB
20 mg | ORAL_TABLET | Freq: Every evening | ORAL | 11 refills | Status: AC
Start: 2021-08-12 — End: ?

## 2021-08-12 MED ORDER — VALACYCLOVIR 500 MG TAB
500 mg | ORAL_TABLET | Freq: Two times a day (BID) | ORAL | 0 refills | Status: AC
Start: 2021-08-12 — End: ?

## 2021-08-12 NOTE — Progress Notes (Signed)
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

## 2021-08-12 NOTE — Progress Notes (Signed)
Progress  Notes by Thedora Hinders, MD at 08/12/21 0930                Author: Thedora Hinders, MD  Service: --  Author Type: Physician       Filed: 08/12/21 1017  Encounter Date: 08/12/2021  Status: Addendum          Editor: Thedora Hinders, MD (Physician)          Related Notes: Original Note by Thedora Hinders, MD (Physician) filed at 08/12/21 Alyssa [name] (DOB: 1981/01/18) is a 41 y.o. 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      1. Encounter for general adult medical examination with abnormal findings   2. Morbid obesity with BMI of 45.0-49.9, adult ([place])   3. Type 2 diabetes mellitus treated with insulin (H[facility])   Recommend patient restarting DM and HTN medications, getting her labs done as originally planned with PCP and getting follow up with PCP to discuss chronic disease management. However, she is cleared  from nursing program standpoint.      No follow-ups on file.         SUBJECTIVE/OBJECTIVE:    Complete Physical   Patient is here today for general physical exam and paperwork for  LPN nursing program clearance. She has DM and HTN and intermittently adherent to medications, though has not taken BP medication today. Patient is otherwise feeling well no concerns. She had gotten her drug screen, TB scren, MMR, varicella, Hep B, Tdap,  covid, and influenza immunizations.        Chronic Conditions Addressed Today               1.  Morbid obesity with BMI of 45.0-49.9, adult ([place])           2.  Type 2 diabetes mellitus treated with insulin ([place])          Acute Diagnoses Addressed Today              Encounter for general adult medical examination with abnormal findings    -  Primary                        Review of  Systems    Constitutional: Negative.     HENT: Negative.      Eyes: Negative.     Respiratory: Negative.      Cardiovascular: Negative.     Gastrointestinal: Negative.     Endocrine: Negative.     Genitourinary: Negative.     Musculoskeletal: Negative.     Skin: Negative.     Allergic/Immunologic: Positive for environmental allergies.    Neurological: Negative.     Hematological: Negative.     Psychiatric/Behavioral: Negative.      All other systems reviewed and are negative.      Visit Vitals      BP  (!) 146/94     Pulse  85     Temp  97.5 F (36.4 C)     Resp  16     Ht  '5\' 3"'  (1.6 m)  Wt  260 lb (117.9 kg)     SpO2  97%        BMI  46.06 kg/m           Physical Exam   Vitals reviewed.    Constitutional:        Appearance: Normal appearance. She is obese. She is not ill-appearing.    HENT:       Head: Normocephalic and atraumatic.       Right Ear: External ear normal.       Left Ear: External ear normal.       Nose: Nose normal.       Mouth/Throat:       Mouth: Mucous membranes are moist.       Pharynx: Oropharynx is clear.    Eyes:       Extraocular Movements: Extraocular movements intact.       Conjunctiva/sclera: Conjunctivae normal.       Pupils: Pupils are equal, round, and reactive to light.     Cardiovascular:       Rate and Rhythm: Normal rate and regular rhythm.       Pulses: Normal pulses.       Heart sounds: Normal heart sounds.    Pulmonary:       Effort: Pulmonary effort is normal.       Breath sounds: Normal breath sounds.     Abdominal:       General: Abdomen is flat. Bowel sounds are normal.       Palpations: Abdomen is soft.     Musculoskeletal:          General: No swelling or deformity. Normal range of motion.       Cervical back: Normal range of motion.    Skin:      General: Skin is warm and dry.       Capillary Refill: Capillary refill takes less than 2 seconds.       Findings: No erythema, lesion or rash.    Neurological:       General: No focal deficit present.       Mental Status:  She is alert and oriented to person, place, and time. Mental status is at baseline.    Psychiatric:          Mood and Affect: Mood normal.          Behavior: Behavior normal.          Thought Content: Thought content normal.          Judgment: Judgment normal.          On this date 08/12/2021 I have spent 24 minutes reviewing previous notes, test results and face to face with the patient discussing the diagnosis and importance of compliance with the treatment plan as well as documenting on the day of the visit.      An electronic signature was used to authenticate this note.   -- Thedora Hinders, MD

## 2021-08-18 ENCOUNTER — Inpatient Hospital Stay
Admit: 2021-08-18 | Discharge: 2021-08-18 | Disposition: A | Discharge status: Home / Self Care | Payer: BLUE CROSS/BLUE SHIELD | Attending: Emergency Medicine

## 2021-08-18 ENCOUNTER — Emergency Department: Admit: 2021-08-18 | Payer: BLUE CROSS/BLUE SHIELD | Primary: Family Medicine

## 2021-08-18 DIAGNOSIS — R109 Unspecified abdominal pain: Secondary | ICD-10-CM

## 2021-08-18 DIAGNOSIS — K660 Peritoneal adhesions (postprocedural) (postinfection): Secondary | ICD-10-CM

## 2021-08-18 LAB — COMPREHENSIVE METABOLIC PANEL
ALT: 22 U/L (ref 12–78)
AST: 9 U/L — ABNORMAL LOW (ref 15–37)
Albumin/Globulin Ratio: 0.8 — ABNORMAL LOW (ref 1.1–2.2)
Albumin: 3.7 g/dL (ref 3.5–5.0)
Alkaline Phosphatase: 74 U/L (ref 45–117)
Anion Gap: 15 mmol/L (ref 5–15)
BUN: 10 MG/DL (ref 6–20)
Bun/Cre Ratio: 14 (ref 12–20)
CO2: 24 mmol/L (ref 21–32)
Calcium: 9.1 MG/DL (ref 8.5–10.1)
Chloride: 101 mmol/L (ref 97–108)
Creatinine: 0.73 MG/DL (ref 0.55–1.02)
ESTIMATED GLOMERULAR FILTRATION RATE: >60 mL/min/{1.73_m2} (ref >60)
Globulin: 4.6 g/dL — ABNORMAL HIGH (ref 2.0–4.0)
Glucose: 176 mg/dL — ABNORMAL HIGH (ref 65–100)
Potassium: 3.8 mmol/L (ref 3.5–5.1)
Sodium: 140 mmol/L (ref 136–145)
Total Bilirubin: 0.6 MG/DL (ref 0.2–1.0)
Total Protein: 8.3 g/dL — ABNORMAL HIGH (ref 6.4–8.2)

## 2021-08-18 LAB — CBC WITH AUTO DIFFERENTIAL
Basophils %: 0 % (ref 0–1)
Basophils Absolute: 0 10*3/uL (ref 0.0–0.1)
Eosinophils %: 1 % (ref 0–7)
Eosinophils Absolute: 0.1 10*3/uL (ref 0.0–0.4)
Granulocyte Absolute Count: 0 10*3/uL
Hematocrit: 36.4 % (ref 35.0–47.0)
Hemoglobin: 11.6 g/dL (ref 11.5–16.0)
Immature Granulocytes: 0 %
Lymphocytes %: 39 % (ref 12–49)
Lymphocytes Absolute: 4.2 10*3/uL — ABNORMAL HIGH (ref 0.8–3.5)
MCH: 25.1 PG — ABNORMAL LOW (ref 26.0–34.0)
MCHC: 31.9 g/dL (ref 30.0–36.5)
MCV: 78.6 FL — ABNORMAL LOW (ref 80.0–99.0)
MPV: 11.3 FL (ref 8.9–12.9)
Monocytes %: 5 % (ref 5–13)
Monocytes Absolute: 0.5 10*3/uL (ref 0.0–1.0)
NRBC Absolute: 0 10*3/uL (ref 0.00–0.01)
Neutrophils %: 55 % (ref 32–75)
Neutrophils Absolute: 5.9 10*3/uL (ref 1.8–8.0)
Nucleated RBCs: 0 PER 100 WBC
Platelets: 425 10*3/uL — ABNORMAL HIGH (ref 150–400)
RBC: 4.63 M/uL (ref 3.80–5.20)
RDW: 15.8 % — ABNORMAL HIGH (ref 11.5–14.5)
WBC: 10.7 10*3/uL (ref 3.6–11.0)

## 2021-08-18 LAB — URINALYSIS WITH MICROSCOPIC
Bilirubin, Urine: NEGATIVE
Blood, Urine: NEGATIVE
Glucose, Ur: NEGATIVE mg/dL
Ketones, Urine: NEGATIVE mg/dL
Leukocyte Esterase, Urine: NEGATIVE
Nitrite, Urine: NEGATIVE
Specific Gravity, UA: >1.03 NA — ABNORMAL HIGH (ref 1.003–1.030)
Urobilinogen, UA, POCT: 0.2 EU/dL (ref 0.2–1.0)
pH, UA: 6 (ref 5.0–8.0)

## 2021-08-18 LAB — LIPASE
Lipase: 133 U/L (ref 73–393)
Lipase: 133 U/L (ref 73–393)

## 2021-08-18 LAB — HCG URINE, QL. - POC
HCG, Pregnancy, Urine, POC: NEGATIVE
Pregnancy test,urine (POC): NEGATIVE

## 2021-08-18 LAB — CBC WITH AUTOMATED DIFF
ABS. BASOPHILS: 0 10*3/uL (ref 0.0–0.1)
ABS. EOSINOPHILS: 0.1 10*3/uL (ref 0.0–0.4)
ABS. IMM. GRANS.: 0 10*3/uL
ABS. LYMPHOCYTES: 4.2 10*3/uL — ABNORMAL HIGH (ref 0.8–3.5)
ABS. MONOCYTES: 0.5 10*3/uL (ref 0.0–1.0)
ABS. NEUTROPHILS: 5.9 10*3/uL (ref 1.8–8.0)
ABSOLUTE NRBC: 0 10*3/uL (ref 0.00–0.01)
BASOPHILS: 0 % (ref 0–1)
EOSINOPHILS: 1 % (ref 0–7)
HCT: 36.4 % (ref 35.0–47.0)
HGB: 11.6 g/dL (ref 11.5–16.0)
IMMATURE GRANULOCYTES: 0 %
LYMPHOCYTES: 39 % (ref 12–49)
MCH: 25.1 PG — ABNORMAL LOW (ref 26.0–34.0)
MCHC: 31.9 g/dL (ref 30.0–36.5)
MCV: 78.6 FL — ABNORMAL LOW (ref 80.0–99.0)
MONOCYTES: 5 % (ref 5–13)
MPV: 11.3 FL (ref 8.9–12.9)
NEUTROPHILS: 55 % (ref 32–75)
NRBC: 0 PER 100 WBC
PLATELET: 425 10*3/uL — ABNORMAL HIGH (ref 150–400)
RBC: 4.63 M/uL (ref 3.80–5.20)
RDW: 15.8 % — ABNORMAL HIGH (ref 11.5–14.5)
WBC: 10.7 10*3/uL (ref 3.6–11.0)

## 2021-08-18 LAB — URINALYSIS W/MICROSCOPIC
Bilirubin: NEGATIVE
Blood: NEGATIVE
Glucose: NEGATIVE mg/dL
Ketone: NEGATIVE mg/dL
Leukocyte Esterase: NEGATIVE
Nitrites: NEGATIVE
Specific gravity: >1.03 — ABNORMAL HIGH (ref 1.003–1.030)
Urobilinogen: 0.2 EU/dL (ref 0.2–1.0)
pH (UA): 6 (ref 5.0–8.0)

## 2021-08-18 LAB — METABOLIC PANEL, COMPREHENSIVE
A-G Ratio: 0.8 — ABNORMAL LOW (ref 1.1–2.2)
ALT (SGPT): 22 U/L (ref 12–78)
AST (SGOT): 9 U/L — ABNORMAL LOW (ref 15–37)
Albumin: 3.7 g/dL (ref 3.5–5.0)
Alk. phosphatase: 74 U/L (ref 45–117)
Anion gap: 15 mmol/L (ref 5–15)
BUN/Creatinine ratio: 14 (ref 12–20)
BUN: 10 MG/DL (ref 6–20)
Bilirubin, total: 0.6 MG/DL (ref 0.2–1.0)
CO2: 24 mmol/L (ref 21–32)
Calcium: 9.1 MG/DL (ref 8.5–10.1)
Chloride: 101 mmol/L (ref 97–108)
Creatinine: 0.73 MG/DL (ref 0.55–1.02)
Globulin: 4.6 g/dL — ABNORMAL HIGH (ref 2.0–4.0)
Glucose: 176 mg/dL — ABNORMAL HIGH (ref 65–100)
Potassium: 3.8 mmol/L (ref 3.5–5.1)
Protein, total: 8.3 g/dL — ABNORMAL HIGH (ref 6.4–8.2)
Sodium: 140 mmol/L (ref 136–145)
eGFR: >60 mL/min/{1.73_m2} (ref >60)

## 2021-08-18 LAB — SAMPLES BEING HELD: SAMPLES BEING HELD: 1

## 2021-08-18 LAB — URINE CULTURE HOLD SAMPLE

## 2021-08-18 MED ORDER — DICYCLOMINE 10 MG CAP
10 mg | ORAL | Status: DC
Start: 2021-08-18 — End: 2021-08-18

## 2021-08-18 MED ORDER — ONDANSETRON 4 MG TAB, RAPID DISSOLVE
4 mg | ORAL_TABLET | Freq: Three times a day (TID) | ORAL | 0 refills | Status: AC | PRN
Start: 2021-08-18 — End: ?
  Filled 2021-08-24: qty 20, 6d supply, fill #0

## 2021-08-18 MED ORDER — DICYCLOMINE 20 MG TAB
20 mg | ORAL_TABLET | Freq: Four times a day (QID) | ORAL | 0 refills | Status: AC | PRN
Start: 2021-08-18 — End: ?
  Filled 2021-08-24: qty 30, 7d supply, fill #0

## 2021-08-18 NOTE — ED Notes (Signed)
Patient discharged by provider.  Discharge paperwork reviewed and patient denies questions.  Leaves ambulatory and in no apparent distress

## 2021-08-18 NOTE — ED Notes (Signed)
Pt reports abd. Pain that started yesterday with nausea.  Pt reports that she had a pencil-like bowel movement this am.
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
## 2021-08-23 MED ORDER — VALACYCLOVIR HCL 500 MG PO TABS
500 MG | ORAL_TABLET | Freq: Two times a day (BID) | ORAL | 1 refills | Status: DC
Start: 2021-08-23 — End: 2021-10-06
  Filled 2021-08-24: qty 180, 90d supply, fill #0

## 2021-08-23 NOTE — Telephone Encounter (Signed)
Pt chart shows last rx was 12/16/20.  No current rx in the system

## 2021-08-23 NOTE — Telephone Encounter (Signed)
Please call patient-what meds specifically does she need refilled-with the new system its hard to see this historical info so please do med rec when on phone with her

## 2021-08-23 NOTE — Telephone Encounter (Signed)
Pt calling in to let us know that the RX that we [facility] needs to be sent to [facility] on [address] because the insurance will not allow her to pick it up from there.     JR

## 2021-08-23 NOTE — Telephone Encounter (Signed)
Pt needed refill for valtrex.  Refill pended

## 2021-08-24 MED FILL — ONDANSETRON 4MG TBDP: 4 MG | 6 days supply | Qty: 20 | Fill #0 | Status: AC

## 2021-08-24 MED FILL — VALACYCLOVIR HCL 500MG TABS: 500 MG | 90 days supply | Qty: 180 | Fill #0 | Status: AC

## 2021-08-24 MED FILL — DICYCLOMINE HCL 20MG TABS: 20 MG | 7 days supply | Qty: 30 | Fill #0 | Status: AC

## 2021-08-24 NOTE — Telephone Encounter (Signed)
As of 08/23/21 patient has used $502 of the maximum of $600 a year in waived co pays for specific medications and pharmacy-related supplies through the [place] for the DM Program.    Patient currently enrolled in the DM Program and is nearing the maximum of $600 a year in waived co pays for specific medications and pharmacy-related supplies through the [place].    Courtesy call placed to patient to advise them of the above information.     Patient unavailable at the time of call. Message left on TAD: Maximum waived co pays for the DM Program has almost been met. Once maximum has been reached, they will begin to be charged their co-payment once again.    I left our number: [phone], option #3 if patient has any questions/concerns.      Lamount Cohen, [place]   Phone: [phone], option #3     For Pharmacy Admin Tracking Only    Program: [place] in place:  No  Time Spent (min): 5

## 2021-08-25 NOTE — Progress Notes (Signed)
Pharmacy Pop Care Documentation:      The application for Alyssa [name] for enrollment into the diabetes management program has been reviewed and accepted on 08/25/21.    [place] Only    Program: [place] in place:  No  Gap Closed?: Yes   Time Spent (min): 5

## 2021-08-26 NOTE — Care Coordination-Inpatient (Signed)
Ambulatory Care Coordination Note    ACM attempted to reach patient for care management follow up call regarding check in. HIPAA compliant message left requesting a return phone call at patient convenience.     Plan for follow-up call in 5-7 days    No future appointments.      Per chart review, patient has been accepted into [facility] Diabetes program.

## 2021-09-02 NOTE — Care Coordination-Inpatient (Signed)
Ambulatory Care Coordination Note    ACM attempted to reach patient for care management follow up call regarding f/up check in for abdominal pain. HIPAA compliant message left requesting a return phone call at patient convenience.     Plan for follow-up call in 10-14 days    No future appointments.

## 2021-09-06 ENCOUNTER — Emergency Department: Admit: 2021-09-07 | Payer: BLUE CROSS/BLUE SHIELD | Primary: Family Medicine

## 2021-09-06 DIAGNOSIS — G44209 Tension-type headache, unspecified, not intractable: Secondary | ICD-10-CM
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
## 2021-09-06 NOTE — ED Triage Notes (Signed)
Patient complaining of severe headache with nausea and vomiting. Patient has history of migraine and has not had one in 10 years until recently.

## 2021-09-07 ENCOUNTER — Inpatient Hospital Stay
Admit: 2021-09-07 | Discharge: 2021-09-07 | Disposition: A | Discharge status: Home / Self Care | Payer: BLUE CROSS/BLUE SHIELD | Attending: Emergency Medicine

## 2021-09-07 MED ORDER — DIPHENHYDRAMINE HCL 25 MG PO CAPS
25 MG | Freq: Four times a day (QID) | ORAL | Status: DC | PRN
Start: 2021-09-07 — End: 2021-09-07
  Administered 2021-09-07: 02:00:00 25 mg via ORAL

## 2021-09-07 MED ORDER — KETOROLAC TROMETHAMINE 60 MG/2ML IM SOLN
60 MG/2ML | Freq: Four times a day (QID) | INTRAMUSCULAR | Status: DC | PRN
Start: 2021-09-07 — End: 2021-09-07
  Administered 2021-09-07: 02:00:00 60 mg via INTRAMUSCULAR

## 2021-09-07 MED ORDER — BUTALBITAL-APAP-CAFFEINE 50-300-40 MG PO CAPS
50-300-40 MG | ORAL_CAPSULE | ORAL | 0 refills | Status: DC | PRN
Start: 2021-09-07 — End: 2022-01-13
  Filled 2021-09-14: qty 20, 3d supply, fill #0

## 2021-09-07 MED ORDER — METOCLOPRAMIDE HCL 10 MG PO TABS
10 MG | Freq: Four times a day (QID) | ORAL | Status: AC
Start: 2021-09-07 — End: 2021-09-07
  Administered 2021-09-07: 02:00:00 10 mg via ORAL

## 2021-09-07 MED ORDER — CLONIDINE HCL 0.1 MG PO TABS
0.1 MG | Freq: Two times a day (BID) | ORAL | Status: AC
Start: 2021-09-07 — End: 2021-09-07
  Administered 2021-09-07: 02:00:00 0.2 mg via ORAL

## 2021-09-07 MED FILL — METOCLOPRAMIDE HCL 10 MG PO TABS: 10 MG | ORAL | Qty: 1

## 2021-09-07 MED FILL — CLONIDINE HCL 0.1 MG PO TABS: 0.1 MG | ORAL | Qty: 2

## 2021-09-07 MED FILL — KETOROLAC TROMETHAMINE 60 MG/2ML IM SOLN: 60 MG/2ML | INTRAMUSCULAR | Qty: 2

## 2021-09-07 MED FILL — DIPHENHYDRAMINE HCL 25 MG PO CAPS: 25 MG | ORAL | Qty: 1

## 2021-09-07 NOTE — Care Coordination-Inpatient (Signed)
Ambulatory Care Coordination Note  09/07/2021    Ambulatory Care Coordination Note    Covering for Syliva Overman, [place] RN  ACM attempted to reach patient for care management follow up call regarding recent ED visit on 09/06/21. HIPAA compliant message left requesting a return phone call at patient convenience.     Plan for follow-up call in 7-10 days    No future appointments.      Lab Results       None                 Goals Addressed    None         No future appointments.

## 2021-09-14 MED FILL — BUTALBITAL-APAP-CAFF 50-300-40 CAPS: 50-300-40 MG | 3 days supply | Qty: 20 | Fill #0 | Status: AC

## 2021-09-16 NOTE — Care Coordination-Inpatient (Signed)
Ambulatory Care Coordination Note    ACM attempted to reach patient for care management follow up call regarding symptom mgmt, concerns/needs. HIPAA compliant message left requesting a return phone call at patient convenience.     Plan for follow-up call in 10-14 days    No future appointments.

## 2021-09-27 NOTE — Telephone Encounter (Signed)
From: [name]  To: Dr. Madelin Rear  Sent: 09/26/2021 6:18 PM EDT  Subject: Medication    I am not able to ask for.a.refill of Metoprolol to go to the [facility] at [facility].

## 2021-09-28 MED ORDER — METOPROLOL SUCCINATE ER 25 MG PO TB24
25 MG | ORAL_TABLET | Freq: Every evening | ORAL | 5 refills | Status: DC
Start: 2021-09-28 — End: 2021-11-02
  Filled 2021-09-28: qty 30, 30d supply, fill #0

## 2021-09-30 NOTE — Care Coordination-Inpatient (Signed)
ACM contacted the patient to follow up on progress, discuss new issues or concerns, and reinforce/provide patient education.     Summary Note: Patient states she has had family members sick, her mom & dad have been sick, aunt is moving out of her home because she can't climb stairs.  Her son got wisdom teeth taken out and another aunt just passed away. States her brother can help with her mom and dad. She admits to emotional eating and has gotten back up to 270 lbs, feels she can't get out of this. She has an appointment to see her pcp, 7/18.  Patient crying, very emotional. Provided support and encouragement. Provided contact info for [facility], advised her to call them when we hang up.  Let patient know that I will call her back tomorrow to ensure she reached out to them.        Reinforced/Provided Education:  Discussed red flags and appropriate site of care based on symptoms and resources available to patient including: PCP  Benefits related nurse triage line.   Provided the following associate/dependent related resources: [facility]- [email or web address]  [facility] # [phone] PW: [facility] for associates   Encouraged patient to reach out to [facility] for 6 free counseling visits.  Also discussed free coaching available on [facility] platform.      Importance and benefits of: Follow up with PCP and specialist, medication adherence, self monitoring and reporting of symptoms.      Plan:  Plan for follow-up call in 1-2 days based on severity of symptoms and risk factors.  Plan for next call:  ensure patient has reached out to [facility] for support.    Patient  verbalized understanding and is agreeable to follow up call.

## 2021-10-01 NOTE — Care Coordination-Inpatient (Signed)
ACM contacted the patient to follow up on progress, discuss new issues or concerns, and reinforce/provide patient education.     Summary Note: Patient states she reached out to [facility] and completed the assessment, she is awaiting assignment to her counselor.  Also encouraged her to utilize coaching through [facility] platform, provided description of where she could locate the coaching option.      Reinforced/Provided Education:  Discussed red flags and appropriate site of care based on symptoms and resources available to patient including:  [facility] and [facility] platform .   Provided the following associate/dependent related resources: [facility]- [email or web address]  [facility] # [phone] PW: [facility] for associates    Importance and benefits of: Follow up with PCP and specialist, medication adherence, self monitoring and reporting of symptoms.      Plan:  Plan for follow-up call in 3-5 days based on severity of symptoms and risk factors.  Plan for next call:  check in, discuss stress level and coping    Patient  verbalized understanding and is agreeable to follow up call.

## 2021-10-06 MED ORDER — VALACYCLOVIR HCL 500 MG PO TABS
500 MG | ORAL_TABLET | Freq: Two times a day (BID) | ORAL | 1 refills | Status: DC
Start: 2021-10-06 — End: 2021-12-31

## 2021-10-07 NOTE — Care Coordination-Inpatient (Signed)
Ambulatory Care Coordination Note    ACM attempted to reach patient for care management follow up call regarding symptom management, ensure patient has connected with counselor through [facility]. HIPAA compliant message left requesting a return phone call at patient convenience.     Plan for follow-up call in 7-10 days    Future Appointments   Date Time Provider [place]   11/02/2021 10:45 AM Madelin Rear, MD [facility]

## 2021-10-12 ENCOUNTER — Telehealth

## 2021-10-12 MED ORDER — ROSUVASTATIN CALCIUM 20 MG PO TABS
20 MG | ORAL_TABLET | Freq: Every evening | ORAL | 0 refills | Status: DC
Start: 2021-10-12 — End: 2021-11-02
  Filled 2021-10-13: qty 90, 90d supply, fill #0

## 2021-10-12 MED FILL — ALCOHOL SWABS  PADS: 90 days supply | Qty: 100 | Fill #1 | Status: AC

## 2021-10-12 NOTE — Telephone Encounter (Signed)
It looks like prior labs were cancelled  Pt not seen by me  in 1 year in person  Labs ordered  They are meant to fasting  Please keep follow up appt    Lab Results   Component Value Date    LABA1C 8.0 (H) 03/20/2021    LABA1C 11.2 (H) 03/27/2020    LABA1C 8.4 (H) 10/14/2019     Lab Results   Component Value Date    LDLCALC 83.2 12/14/2020    CREATININE 0.73 08/18/2021

## 2021-10-12 NOTE — Telephone Encounter (Addendum)
Pt walked in checking on lab orders for A1C. I did not see any open orders in the old or new epic. She needs to have it done before 10/16/21 so she does not get kicked out of the [facility] program.     She also needs a refill on her rosuvastatin '20mg'$  please send to [facility]    Please call once order is in  [phone]

## 2021-10-13 ENCOUNTER — Encounter

## 2021-10-13 NOTE — Telephone Encounter (Signed)
Called and spoke with pt, and she has been advised and states understanding of this and agrees with plan.

## 2021-10-14 LAB — COMPREHENSIVE METABOLIC PANEL
ALT: 24 U/L (ref 12–78)
AST: 16 U/L (ref 15–37)
Albumin/Globulin Ratio: 0.9 — ABNORMAL LOW (ref 1.1–2.2)
Albumin: 4 g/dL (ref 3.5–5.0)
Alk Phosphatase: 64 U/L (ref 45–117)
Anion Gap: 8 mmol/L (ref 5–15)
BUN/Creatinine Ratio: 17 (ref 12–20)
BUN: 12 MG/DL (ref 6–20)
CO2: 24 mmol/L (ref 21–32)
Calcium: 10 MG/DL (ref 8.5–10.1)
Chloride: 104 mmol/L (ref 97–108)
Creatinine: 0.69 MG/DL (ref 0.55–1.02)
Est, Glom Filt Rate: >60 mL/min/{1.73_m2} (ref >60)
Globulin: 4.5 g/dL — ABNORMAL HIGH (ref 2.0–4.0)
Glucose: 219 mg/dL — ABNORMAL HIGH (ref 65–100)
Potassium: 4 mmol/L (ref 3.5–5.1)
Sodium: 136 mmol/L (ref 136–145)
Total Bilirubin: 0.6 MG/DL (ref 0.2–1.0)
Total Protein: 8.5 g/dL — ABNORMAL HIGH (ref 6.4–8.2)

## 2021-10-14 LAB — LIPID PANEL
Chol/HDL Ratio: 4.3 (ref 0.0–5.0)
Cholesterol, Total: 192 MG/DL (ref <200)
HDL: 45 MG/DL
LDL Calculated: 113.2 MG/DL — ABNORMAL HIGH (ref 0–100)
Triglycerides: 169 MG/DL — ABNORMAL HIGH (ref <150)
VLDL Cholesterol Calculated: 33.8 MG/DL

## 2021-10-14 LAB — CBC
Hematocrit: 38.1 % (ref 35.0–47.0)
Hemoglobin: 11.9 g/dL (ref 11.5–16.0)
MCH: 25.2 PG — ABNORMAL LOW (ref 26.0–34.0)
MCHC: 31.2 g/dL (ref 30.0–36.5)
MCV: 80.5 FL (ref 80.0–99.0)
MPV: 11.5 FL (ref 8.9–12.9)
Nucleated RBCs: 0 PER 100 WBC
Platelets: 462 10*3/uL — ABNORMAL HIGH (ref 150–400)
RBC: 4.73 M/uL (ref 3.80–5.20)
RDW: 15 % — ABNORMAL HIGH (ref 11.5–14.5)
WBC: 9.7 10*3/uL (ref 3.6–11.0)
nRBC: 0 10*3/uL (ref 0.00–0.01)

## 2021-10-14 LAB — HEMOGLOBIN A1C
Estimated Avg Glucose: 243 mg/dL
Hemoglobin A1C: 10.1 % — ABNORMAL HIGH (ref 4.0–5.6)

## 2021-10-14 LAB — MICROALBUMIN / CREATININE URINE RATIO
Creatinine, Ur: 327 mg/dL
Microalb, Ur: 30.6 mg/dL
Microalb/Creat Ratio: 94 mg/g — ABNORMAL HIGH (ref 0–30)

## 2021-10-14 NOTE — Care Coordination-Inpatient (Signed)
ACM contacted the patient to follow up on progress, discuss new issues or concerns, and reinforce/provide patient education.     Summary Note: Patient states she has not seen her counselor as she had to cancel and appt and then the counselor had to cancel.  She has been opening up to her family members more and getting support through them.  Encouraged her to continue with counseling, states she plans to.  States she had her A1C drawn yesterday, expects it to be bad, as she has been uncontrolled for the last few months.  Offered supportive resources, patient stated, "I know what I need to eat, I just have to do it."  States she had 2 hard boiled eggs, with oatmeal and coffee with splenda this morning for breakfast.    Per chart review, A1c is 10.1 - 10/13/21    Reinforced/Provided Education:  Discussed red flags and appropriate site of care based on symptoms and resources available to patient including:  provided support and encouragement .   Importance and benefits of: Follow up with PCP and specialist, medication adherence, self monitoring and reporting of symptoms.      Plan:  Plan for follow-up call in 10-14 days based on severity of symptoms and risk factors.  Plan for next call: self management-diabetes mgmt?    Patient  verbalized understanding and is agreeable to follow up call.

## 2021-10-20 MED FILL — METFORMIN HCL ER 500MG TB24: 500 500 MG | ORAL | 30 days supply | Qty: 120 | Fill #0 | Status: AC

## 2021-10-20 MED FILL — DICYCLOMINE HCL 10MG CAPS: 10 10 MG | ORAL | 30 days supply | Qty: 60 | Fill #0 | Status: AC

## 2021-10-28 NOTE — Care Coordination-Inpatient (Signed)
ACM outreach for follow up.  Patient states she is no longer working for [employer].  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 Patient has exhausted all counseling sessions through [facility].  States she can't afford counseling services at this time.  Feels strongly about becoming caregiver, as she wants to be able to support her son.    Reminded her about the importance of maintaining control of her diabetes.

## 2021-11-02 ENCOUNTER — Ambulatory Visit
Admit: 2021-11-02 | Discharge: 2021-11-02 | Payer: BLUE CROSS/BLUE SHIELD | Attending: Family Medicine | Primary: Family Medicine

## 2021-11-02 DIAGNOSIS — Z794 Long term (current) use of insulin: Secondary | ICD-10-CM

## 2021-11-02 MED ORDER — ROSUVASTATIN CALCIUM 20 MG PO TABS
20 MG | ORAL_TABLET | Freq: Every evening | ORAL | 0 refills | Status: DC
Start: 2021-11-02 — End: 2021-12-31

## 2021-11-02 MED ORDER — DICYCLOMINE HCL 20 MG PO TABS
20 MG | ORAL_TABLET | Freq: Four times a day (QID) | ORAL | 1 refills | Status: DC | PRN
Start: 2021-11-02 — End: 2021-12-31
  Filled 2021-11-02: qty 360, 90d supply, fill #0

## 2021-11-02 MED ORDER — LISINOPRIL 20 MG PO TABS
20 MG | ORAL_TABLET | Freq: Every day | ORAL | 1 refills | Status: DC
Start: 2021-11-02 — End: 2021-12-31
  Filled 2021-11-02: qty 90, 90d supply, fill #0

## 2021-11-02 MED ORDER — FREESTYLE LIBRE 2 SENSOR MISC
1 refills | Status: DC
Start: 2021-11-02 — End: 2022-09-02

## 2021-11-02 MED ORDER — TRULICITY 4.5 MG/0.5ML SC SOPN
4.50.5 MG/0.5ML | SUBCUTANEOUS | 3 refills | Status: DC
Start: 2021-11-02 — End: 2021-12-03
  Filled 2021-11-02: qty 2, 28d supply, fill #0

## 2021-11-02 MED ORDER — METOPROLOL SUCCINATE ER 25 MG PO TB24
25 MG | ORAL_TABLET | Freq: Every evening | ORAL | 1 refills | Status: DC
Start: 2021-11-02 — End: 2021-12-31
  Filled 2021-11-02: qty 90, 90d supply, fill #0

## 2021-11-02 MED ORDER — PREVNAR 20 0.5 ML IM SUSY
0.5 ML | Freq: Once | INTRAMUSCULAR | 0 refills | Status: AC
Start: 2021-11-02 — End: 2021-11-02
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
## 2021-11-02 NOTE — Patient Instructions (Signed)
Labs reviewed with patient  Meds renewed  She wil lhave insurance change coming up  For now:  Increase insulin by 2 units every to days to goal fasting sugar <120  Stay for now on trulicity  Discussion of changing to mounjaro  Intolerante of glimepiride, jardiance and does have gastroparesis which the trulicity might complicate but we have limited choices and are trying to strech protection for kidneys/cardiac  Fhx stroke--stay on statin    Dietary counseling through diabetic ed  Resources for counseling for disordered eating behavior (psych/nutrition)    Fu with me in 4-6 wk     Recommend eye exam

## 2021-11-02 NOTE — Progress Notes (Signed)
Rm    Chief Complaint   Patient presents with    Weight Management    Diabetes        BP 138/83 (Site: Left Upper Arm, Position: Sitting, Cuff Size: Large Adult)   Pulse (!) 101   Temp 97.8 F (36.6 C) (Temporal)   Resp 18   Ht '5\' 2"'$  (1.575 m)   Wt 262 lb (118.8 kg)   SpO2 98%   BMI 47.92 kg/m      1. Have you been to the ER, urgent care clinic since your last visit?  Hospitalized since your last visit? no    2. Have you seen or consulted any other health care providers outside of the [place] since your last visit?  Include any pap smears or colon screening.  no    Health Maintenance Due   Topic Date Due    Varicella vaccine (1 of 2 - 2-dose childhood series) Never done    Hepatitis B vaccine (1 of 3 - Risk 3-dose series) Never done    COVID-19 Vaccine (4 - Booster for Pfizer series) 04/07/2020    Diabetic retinal exam  03/26/2021        No flowsheet data found.     No flowsheet data found.    No flowsheet data found.

## 2021-11-03 MED FILL — FREESTYLE LIBRE 2 SENSOR  MISC: 84 days supply | Qty: 6 | Fill #0 | Status: AC

## 2021-11-04 NOTE — Care Coordination-Inpatient (Signed)
11/04/21  Chart reviewed, patient already working with ACM at this time.

## 2021-11-15 MED FILL — CEFUROXIME AXETIL 500MG TABS: 500 500 MG | ORAL | 7 days supply | Qty: 14 | Fill #0 | Status: AC

## 2021-11-15 MED FILL — DOXYCYCLINE HYCLATE 100MG CAPS: 100 100 MG | ORAL | 10 days supply | Qty: 20 | Fill #0 | Status: AC

## 2021-11-18 ENCOUNTER — Ambulatory Visit: Payer: BLUE CROSS/BLUE SHIELD | Primary: Family Medicine

## 2021-12-02 ENCOUNTER — Ambulatory Visit: Payer: BLUE CROSS/BLUE SHIELD | Attending: Family Medicine | Primary: Family Medicine

## 2021-12-06 ENCOUNTER — Telehealth
Admit: 2021-12-03 | Discharge: 2021-12-06 | Payer: BLUE CROSS/BLUE SHIELD | Attending: Family Medicine | Primary: Family Medicine

## 2021-12-06 DIAGNOSIS — Z794 Long term (current) use of insulin: Secondary | ICD-10-CM

## 2021-12-06 MED ORDER — TRULICITY 4.5 MG/0.5ML SC SOPN
4.50.5 MG/0.5ML | SUBCUTANEOUS | 3 refills | Status: AC
Start: 2021-12-06 — End: 2022-01-13

## 2021-12-06 NOTE — Progress Notes (Signed)
Alyssa [name] is a 41 y.o. female who was seen by synchronous (real-time) audio-video technology on 12/06/2021.      Consent: Alyssa [name], who was seen by synchronous (real-time) audio-video technology, and/or her healthcare decision maker, is aware that this patient-initiated, Telehealth encounter on 12/06/2021 is a billable service, with coverage as determined by her insurance carrier. She is aware that she may receive a bill and has provided verbal consent to proceed: Yes.    Assessment & Plan:      Diagnosis Orders   1. Type 2 diabetes mellitus treated with insulin ([place])        2. Type 2 diabetes mellitus with hyperglycemia, with long-term current use of insulin (HCC)        3. Morbid obesity with BMI of 45.0-49.9, adult ([place])          DM check in today  Did not titrate insulin from last visit to  now  Is using libre but not tracking data closely    Plan:   Increase insulin 2u ever 2 days to fasting sugar <120 over the next few weeks  Fu with me 1 mo  Continue mtfromin and trulicity as they are for now    Next visit in office for weight and sugar check        Pt was counseled on risks, benefits and alternatives of treatment options. All questions were asked and answered and the patient was agreeable with the treatment plan as outlined.    Subjective:   Alyssa [name] is a 41 y.o. female who was seen for Follow-up (Diabetes and weight.)      Diabetic control since ou rlast visit   She says her numbers when she wakes up is 140s  She has less cotton mouth than she was before so she thinkst hat means her numbers are probably lower    Key Antihyperglycemic Medications               Dulaglutide (TRULICITY) 4.5 BP/1.0CH SOPN (Taking) Inject 4.5 mg into the skin every 7 days    Insulin Glargine, 2 Unit Dial, (TOUJEO MAX SOLOSTAR) 300 UNIT/ML SOPN (Taking) Inject 80 Units into the skin    metFORMIN (GLUCOPHAGE-XR) 500 MG extended release tablet (Taking) Take 4 tablets by mouth Daily with supper          Insulin  dosing--did not go up to get fasting sugars down  She forgot to do this    Still has metformin doing ok with    She has the freestyle libre, but she's not wearing it now  She's not been watching the data    Gastroparesis is not bothering her very much right now per report, it is managable she tells me    Wt Readings from Last 3 Encounters:   11/02/21 262 lb (118.8 kg)   09/06/21 260 lb (117.9 kg)   08/12/21 260 lb (117.9 kg)       Medications, allergies, PMH, PSH, SOCH, [place] reviewed and updated per routine protocol, see chart for review and changes if not noted here.    Review of Systems    A 12 point review of systems was negative except as noted here or in the HPI.    Objective:   Vital Signs: (As obtained by patient/caregiver at home)    Patient-Reported Vitals 12/06/2021   Patient-Reported Weight 262lb   Patient-Reported Height 71fi   Patient-Reported Systolic -   Patient-Reported Diastolic -   Patient-Reported Pulse -         [  INSTRUCTIONS:  "'[x]'$ " Indicates a positive item  "'[]'$ " Indicates a negative item  -- DELETE ALL ITEMS NOT EXAMINED]    Constitutional: '[x]'$  Appears well-developed and well-nourished '[x]'$  No apparent distress      '[]'$  Abnormal -     Mental status: '[x]'$  Alert and awake  '[x]'$  Oriented to person/place/time '[x]'$  Able to follow commands    '[]'$  Abnormal -     Eyes:   EOM    '[x]'$   Normal    '[]'$  Abnormal -   Sclera  '[x]'$   Normal    '[]'$  Abnormal -          Discharge '[x]'$   None visible   '[]'$  Abnormal -     HENT: '[x]'$  Normocephalic, atraumatic  '[]'$  Abnormal -   '[x]'$  Mouth/Throat: Mucous membranes are moist    External Ears '[x]'$  Normal  '[]'$  Abnormal -    Neck: '[x]'$  No visualized mass '[]'$  Abnormal -     Pulmonary/Chest: '[x]'$  Respiratory effort normal   '[x]'$  No visualized signs of difficulty breathing or respiratory distress        '[]'$  Abnormal -      Musculoskeletal:   '[]'$  Normal gait with no signs of ataxia         '[x]'$  Normal range of motion of neck        '[]'$  Abnormal -     Neurological:        '[x]'$  No Facial Asymmetry (Cranial  nerve 7 motor function) (limited exam due to video visit)          '[x]'$  No gaze palsy        '[]'$  Abnormal -          Skin:        '[x]'$  No significant exanthematous lesions or discoloration noted on facial skin         '[]'$  Abnormal -            Psychiatric:       '[x]'$  Normal Affect '[]'$  Abnormal -        '[x]'$  No Hallucinations    Other pertinent observable physical exam findings:seated    We discussed the expected course, resolution and complications of the diagnosis(es) in detail.  Medication risks, benefits, costs, interactions, and alternatives were discussed as indicated.  I advised her to contact the office if her condition worsens, changes or fails to improve as anticipated. She expressed understanding with the diagnosis(es) and plan.       Alyssa [name] is a 41 y.o. female who was evaluated by a video visit encounter for concerns as above. Patient identification was verified prior to start of the visit. A caregiver was present when appropriate. Due to this being a Scientist, physiological (During BJSEG-31 public health emergency), evaluation of the following organ systems was limited: Vitals/Constitutional/EENT/Resp/CV/GI/GU/MS/Neuro/Skin/Heme-Lymph-Imm.  Pursuant to the emergency declaration under the [place], 1135 waiver authority and the [employer] and [employer] Act, this Virtual  Visit was conducted, with patient's (and/or legal guardian's) consent, to reduce the patient's risk of exposure to COVID-19 and provide necessary medical care.     Services were provided through a video synchronous discussion virtually to substitute for in-person clinic visit.   Patient and provider were located at their individual homes.      Alyssa Poisson, MD  [facility]  12/06/21 3:38 PM     Portions of this note may have been populated using smart dictation software  and may have "sounds-like" errors present.

## 2021-12-06 NOTE — Progress Notes (Signed)
Chief Complaint   Patient presents with    Follow-up     Diabetes and weight.     1. Have you been to the ER, urgent care clinic since your last visit?  Hospitalized since your last visit?No    2. Have you seen or consulted any other health care providers outside of the [place] since your last visit?  Include any pap smears or colon screening. No

## 2021-12-19 ENCOUNTER — Inpatient Hospital Stay
Admit: 2021-12-19 | Discharge: 2021-12-19 | Disposition: A | Discharge status: Home / Self Care | Payer: MEDICAID | Attending: Student in an Organized Health Care Education/Training Program

## 2021-12-19 DIAGNOSIS — R1013 Epigastric pain: Secondary | ICD-10-CM

## 2021-12-19 LAB — COMPREHENSIVE METABOLIC PANEL
ALT: 21 U/L (ref 12–78)
AST: 8 U/L — ABNORMAL LOW (ref 15–37)
Albumin/Globulin Ratio: 0.7 — ABNORMAL LOW (ref 1.1–2.2)
Albumin: 3.2 g/dL — ABNORMAL LOW (ref 3.5–5.0)
Alk Phosphatase: 59 U/L (ref 45–117)
Anion Gap: 6 mmol/L (ref 5–15)
BUN: 9 MG/DL (ref 6–20)
Bun/Cre Ratio: 14 (ref 12–20)
CO2: 29 mmol/L (ref 21–32)
Calcium: 9 MG/DL (ref 8.5–10.1)
Chloride: 102 mmol/L (ref 97–108)
Creatinine: 0.63 MG/DL (ref 0.55–1.02)
Est, Glom Filt Rate: >60 mL/min/{1.73_m2} (ref >60)
Globulin: 4.5 g/dL — ABNORMAL HIGH (ref 2.0–4.0)
Glucose: 195 mg/dL — ABNORMAL HIGH (ref 65–100)
Potassium: 3.4 mmol/L — ABNORMAL LOW (ref 3.5–5.1)
Sodium: 137 mmol/L (ref 136–145)
Total Bilirubin: 0.4 MG/DL (ref 0.2–1.0)
Total Protein: 7.7 g/dL (ref 6.4–8.2)

## 2021-12-19 LAB — CBC WITH AUTO DIFFERENTIAL
Absolute Immature Granulocyte: 0.1 10*3/uL — ABNORMAL HIGH (ref 0.00–0.04)
Basophils %: 1 % (ref 0–1)
Basophils Absolute: 0.1 10*3/uL (ref 0.0–0.1)
Eosinophils %: 2 % (ref 0–7)
Eosinophils Absolute: 0.2 10*3/uL (ref 0.0–0.4)
Hematocrit: 36 % (ref 35.0–47.0)
Hemoglobin: 11.4 g/dL — ABNORMAL LOW (ref 11.5–16.0)
Immature Granulocytes: 1 % — ABNORMAL HIGH (ref 0.0–0.5)
Lymphocytes %: 31 % (ref 12–49)
Lymphocytes Absolute: 2.9 10*3/uL (ref 0.8–3.5)
MCH: 25.1 PG — ABNORMAL LOW (ref 26.0–34.0)
MCHC: 31.7 g/dL (ref 30.0–36.5)
MCV: 79.3 FL — ABNORMAL LOW (ref 80.0–99.0)
MPV: 11.5 FL (ref 8.9–12.9)
Monocytes %: 8 % (ref 5–13)
Monocytes Absolute: 0.7 10*3/uL (ref 0.0–1.0)
Neutrophils %: 57 % (ref 32–75)
Neutrophils Absolute: 5.4 10*3/uL (ref 1.8–8.0)
Nucleated RBCs: 0 PER 100 WBC
Platelets: 371 10*3/uL (ref 150–400)
RBC: 4.54 M/uL (ref 3.80–5.20)
RDW: 14.8 % — ABNORMAL HIGH (ref 11.5–14.5)
WBC: 9.4 10*3/uL (ref 3.6–11.0)
nRBC: 0 10*3/uL (ref 0.00–0.01)

## 2021-12-19 LAB — EXTRA TUBES HOLD

## 2021-12-19 LAB — LIPASE: Lipase: 208 U/L (ref 73–393)

## 2021-12-19 LAB — MAGNESIUM: Magnesium: 1.6 mg/dL (ref 1.6–2.4)

## 2021-12-19 MED ORDER — FAMOTIDINE 20 MG PO TABS
20 MG | ORAL_TABLET | Freq: Two times a day (BID) | ORAL | 0 refills | Status: AC
Start: 2021-12-19 — End: 2022-01-13

## 2021-12-19 MED ORDER — ONDANSETRON HCL 4 MG/2ML IJ SOLN
4 MG/2ML | Freq: Once | INTRAMUSCULAR | Status: AC
Start: 2021-12-19 — End: 2021-12-19
  Administered 2021-12-19: 10:00:00 4 mg via INTRAVENOUS

## 2021-12-19 MED ORDER — SODIUM CHLORIDE 0.9 % IV BOLUS
0.9 % | Freq: Once | INTRAVENOUS | Status: AC
Start: 2021-12-19 — End: 2021-12-19
  Administered 2021-12-19: 10:00:00 1000 mL via INTRAVENOUS

## 2021-12-19 MED ORDER — ONDANSETRON 4 MG PO TBDP
4 MG | ORAL_TABLET | Freq: Three times a day (TID) | ORAL | 0 refills | Status: DC | PRN
Start: 2021-12-19 — End: 2022-06-01

## 2021-12-19 MED ORDER — KETOROLAC TROMETHAMINE 15 MG/ML IJ SOLN
15 MG/ML | Freq: Once | INTRAMUSCULAR | Status: AC
Start: 2021-12-19 — End: 2021-12-19
  Administered 2021-12-19: 10:00:00 15 mg via INTRAVENOUS

## 2021-12-19 MED ORDER — SODIUM CHLORIDE (PF) 0.9 % IJ SOLN
0.9 % | INTRAMUSCULAR | Status: AC
Start: 2021-12-19 — End: 2021-12-19
  Administered 2021-12-19: 10:00:00 20 mg via INTRAVENOUS

## 2021-12-19 MED ORDER — SODIUM CHLORIDE (PF) 0.9 % IJ SOLN
0.9 % | Freq: Two times a day (BID) | INTRAMUSCULAR | Status: DC
Start: 2021-12-19 — End: 2021-12-19

## 2021-12-19 MED FILL — FAMOTIDINE (PF) 20 MG/2ML IV SOLN: 20 MG/2ML | INTRAVENOUS | Qty: 2

## 2021-12-19 MED FILL — SODIUM CHLORIDE 0.9 % IV SOLN: 0.9 % | INTRAVENOUS | Qty: 1000

## 2021-12-19 MED FILL — ONDANSETRON HCL 4 MG/2ML IJ SOLN: 4 MG/2ML | INTRAMUSCULAR | Qty: 2

## 2021-12-19 MED FILL — KETOROLAC TROMETHAMINE 15 MG/ML IJ SOLN: 15 MG/ML | INTRAMUSCULAR | Qty: 1

## 2021-12-19 NOTE — ED Triage Notes (Signed)
Pt reports feeling gassy and bloated on Thursday, took gas-x with no relief.   Reports abd pain, nausea and vomiting.  Denies diarrhea or constipation  Last BM was yesterday  Hx of gastroparesis     Pt is scheduled for endoscopy and colonoscopy on Tuesday
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
## 2021-12-21 ENCOUNTER — Inpatient Hospital Stay: Payer: Medicaid (Managed Care) | Attending: Gastroenterology

## 2021-12-21 LAB — POCT GLUCOSE: POC Glucose: 254 mg/dL — ABNORMAL HIGH (ref 65–117)

## 2021-12-21 MED ORDER — GLYCOPYRROLATE 0.2 MG/ML IJ SOLN
0.2 MG/ML | INTRAMUSCULAR | Status: DC | PRN
Start: 2021-12-21 — End: 2021-12-21
  Administered 2021-12-21: 15:00:00 .2 via INTRAVENOUS

## 2021-12-21 MED ORDER — SODIUM CHLORIDE 0.9 % IV SOLN
0.9 % | INTRAVENOUS | Status: DC | PRN
Start: 2021-12-21 — End: 2021-12-21

## 2021-12-21 MED ORDER — LIDOCAINE HCL (PF) 2 % IJ SOLN
2 % | INTRAMUSCULAR | Status: DC | PRN
Start: 2021-12-21 — End: 2021-12-21
  Administered 2021-12-21: 15:00:00 25 via INTRAVENOUS

## 2021-12-21 MED ORDER — NORMAL SALINE FLUSH 0.9 % IV SOLN
0.9 % | Freq: Two times a day (BID) | INTRAVENOUS | Status: DC
Start: 2021-12-21 — End: 2021-12-21

## 2021-12-21 MED ORDER — PROPOFOL 200 MG/20ML IV EMUL
200 MG/20ML | INTRAVENOUS | Status: DC | PRN
Start: 2021-12-21 — End: 2021-12-21
  Administered 2021-12-21: 15:00:00 150 via INTRAVENOUS
  Administered 2021-12-21: 15:00:00 100 via INTRAVENOUS
  Administered 2021-12-21: 15:00:00 150 via INTRAVENOUS

## 2021-12-21 MED ORDER — NORMAL SALINE FLUSH 0.9 % IV SOLN
0.9 % | INTRAVENOUS | Status: DC | PRN
Start: 2021-12-21 — End: 2021-12-21

## 2021-12-21 MED FILL — BD POSIFLUSH 0.9 % IV SOLN: 0.9 % | INTRAVENOUS | Qty: 40
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
## 2021-12-21 NOTE — Other (Signed)
Initial RN admission and assessment performed and documented in Endoscopy navigator.     Patient evaluated by anesthesia in pre-procedure holding.    All procedural vital signs, airway assessment, and level of consciousness information monitored and recorded by anesthesia staff on the anesthesia record.

## 2021-12-21 NOTE — Progress Notes (Signed)
Endoscopy discharge instructions have been reviewed and given to patient.  The patient verbalized understanding and acceptance of instructions.      Dr. Seward Speck discussed with patient and aunt procedure findings and next steps.

## 2021-12-21 NOTE — Anesthesia Post-Procedure Evaluation (Signed)
Department of Anesthesiology  Postprocedure Note    Patient: Aoife Bold  MRN: [id number]  Birthdate: 05-25-1980  Date of evaluation: 12/21/2021      Procedure Summary     Date: 12/21/21 Room / Location: [facility] ENDO 03 / [facility] END[facility]    Anesthesia Start: 1032 Anesthesia Stop: 1059    Procedures:       COLONOSCOPY (Lower GI Region)      ESOPHAGOGASTRODUODENOSCOPY (Upper GI Region)      EGD BIOPSY (Upper GI Region)      COLONOSCOPY WITH BIOPSY (Lower GI Region) Diagnosis:       Colon cancer screening      Gastroesophageal reflux disease, unspecified whether esophagitis present      (Colon cancer screening [Z12.11])      (Gastroesophageal reflux disease, unspecified whether esophagitis present [K21.9])    Surgeons: Cleda Clarks, MD Responsible Provider: Anselm Lis, MD    Anesthesia Type: MAC ASA Status: 3          Anesthesia Type: MAC    Aldrete Phase I: Aldrete Score: 10    Aldrete Phase II: Aldrete Score: 9      Anesthesia Post Evaluation    Patient location during evaluation: PACU  Patient participation: complete - patient participated  Level of consciousness: awake  Pain score: 0  Airway patency: patent  Nausea & Vomiting: no nausea and no vomiting  Complications: no  Cardiovascular status: blood pressure returned to baseline  Respiratory status: acceptable  Hydration status: euvolemic  Pain management: adequate

## 2021-12-21 NOTE — Procedures (Signed)
[place] [facility]  Cleda Clarks, M.D.  [phone]           12/21/2021                EGD Operative Report  Alyssa [name]  DOB:  December 18, 1980  [place] Record Number:  [id number]      Indication: GERD     Operator: Cleda Clarks, MD    Assistant -- None  Implants -- None    Referring Provider:  Madelin Rear, MD      Anesthesia/Sedation:  MAC anesthesia Propofol    Airway assessment: No airway problems anticipated    Pre-Procedural Exam:      Airway: clear, no airway problems anticipated  Heart: RRR, without gallops or rubs  Lungs: clear bilaterally without wheezes, crackles, or rhonchi  Abdomen: soft, nontender, nondistended, bowel sounds present  Mental Status: awake, alert and oriented to person, place and time       Procedure Details     After infomed consent was obtained for the procedure, with all risks and benefits of procedure explained the patient was taken to the endoscopy suite and placed in the left lateral decubitus position.  Following sequential administration of sedation as per above, the endoscope was inserted into the mouth and advanced under direct vision to second portion of the duodenum.  A careful inspection was made as the gastroscope was withdrawn, including a retroflexed view of the proximal stomach; findings and interventions are described below.      Findings:   Esophagus:normal  Stomach: normal   Duodenum/jejunum: normal    Therapies:  biopsy of stomach  - r/o H.pylori  biopsy of duodenal - r/o celiac disease    Specimens:  as above           Complications:   None; patient tolerated the procedure well.    EBL:  None.           Impression:    Normal EGD      Recommendations:    -Await pathology.  -Proceed with colonoscopy today as planned    Cleda Clarks, MD

## 2021-12-21 NOTE — Procedures (Signed)
[name] - [facility], M.D.  [phone]            12/21/2021          Colonoscopy Operative Report  Alyssa [name]  DOB:  07-05-1980  [facility] Medical Record Number:  [id number]      Indications:   colon cancer screening and change in bowel habits      Operator:  Cleda Clarks, MD    Assistant -- None  Implants -- None    Referring Provider: Madelin Rear, MD    Sedation:  MAC anesthesia Propofol    Pre-Procedural Exam:      Airway: clear,  No airway problems anticipated  Heart: RRR, without gallops or rubs  Lungs: clear bilaterally without wheezes, crackles, or rhonchi  Abdomen: soft, nontender, nondistended, bowel sounds present  Mental Status: awake, alert and oriented to person, place and time     Procedure Details:  After informed consent was obtained with all risks and benefits of procedure explained and preoperative exam completed, the patient was taken to the endoscopy suite and placed in the left lateral decubitus position.  Upon sequential sedation as per above, a digital rectal exam was performed. The Olympus videocolonoscope  was inserted in the rectum and carefully advanced to the terminal ileum.  The quality of preparation was good.  The colonoscope was slowly withdrawn with careful inspection and evaluation between folds. Retroflexion in the rectum was performed.    Findings:   Terminal Ileum: normal  Cecum: normal  Ascending Colon: normal  Transverse Colon: normal  Descending Colon: normal  Sigmoid: surgically absent  Rectum: normal    Interventions:  biopsy of colon - randomly to r/o microscopic colitis    Specimen Removed:   as above    Complications: None.     EBL:  None.    Impression:  Normal exam    Recommendations:  -Await pathology.  -Repeat colonoscopy in 10 years     Discharge Disposition:  Home in the company of a driver when able to ambulate.    Cleda Clarks, MD  12/21/2021  10:57 AM

## 2021-12-21 NOTE — Discharge Instructions (Signed)
[place]. [facility]  Cleda Clarks, M.D.  [phone]                 COLON and EGD DISCHARGE INSTRUCTIONS    12/21/2021    Alyssa [name]  DOB:  14-Mar-1981  [facility] Medical Record Number:  [id number]      DISCOMFORT:  Sore throat- throat lozenges or warm salt water gargle  Redness at IV site- apply warm compress to area; if redness or soreness persist- contact your physician  There may be a slight amount of blood passed from the rectum  Gaseous discomfort- walking, belching will help relieve any discomfort  You may not operate a vehicle for 12 hours  You may not engage in an occupation involving machinery or appliances for rest of today  You may not drink alcoholic beverages for at least 12 hours  Avoid making any critical decisions for at least 24 hour  DIET:   regular   - however -  remember your colon is empty and a heavy meal will produce gas.   Avoid these foods:  vegetables, fried / greasy foods, carbonated drinks for today     ACTIVITY:  You may  resume your normal daily activities it is recommended that you spend the remainder of the day resting -  avoid any strenuous activity and driving.    CALL M.D.  ANY SIGN OF:   Increasing pain, nausea, vomiting  Abdominal distension (swelling)  New increased bleeding (oral or rectal)  Fever (chills)  Pain in chest area  Bloody discharge from nose or mouth  Shortness of breath      Follow-up Instructions:   Call Dr. Seward Speck if any questions at [phone].  Results of procedure / biopsy in 7 to 10 days, we will call you with these results.  Your endoscopy showed normal exam   Your colonoscopy showed normal exam

## 2021-12-21 NOTE — Other (Signed)
Nursing report given to Alyssa Ala, RN. Patient tolerated procedure without problems. Abdomen soft and patient arousable and voices no complaints Report received from Carmie End, CRNA, see anesthesia note. Patient transported to endoscopy recovery area.  Scope pre-cleaned by Gretta Cool.

## 2021-12-21 NOTE — Progress Notes (Signed)
Alyssa [name]  [birth year]  [id number]    Situation:  Verbal report received from: Johnetta RN  Procedure: Procedure(s):  COLONOSCOPY  ESOPHAGOGASTRODUODENOSCOPY  EGD BIOPSY  COLONOSCOPY WITH BIOPSY    Background:    Preoperative diagnosis: Colon cancer screening [Z12.11]  Gastroesophageal reflux disease, unspecified whether esophagitis present [K21.9]  Postoperative diagnosis: * No post-op diagnosis entered *    Operator:  Dr. Seward Speck  Assistant(s): Circulator: Kermit Balo, RN  Endoscopy Technician: Alford Highland    Specimens:   ID Type Source Tests Collected by Time Destination   A : Duodenum Biopsy.  Tissue Duodenum SURGICAL PATHOLOGY Cleda Clarks, MD 12/21/2021 1042    B : Gastric Biopsy.  Tissue Gastric SURGICAL PATHOLOGY Cleda Clarks, MD 12/21/2021 1043    C : Random Colon Biopsy.  Tissue Colon SURGICAL PATHOLOGY Cleda Clarks, MD 12/21/2021 1049      H. Pylori  No    Assessment:  Intra-procedure medications   Anesthesia gave intra-procedure sedation and medications, see anesthesia flow sheet Yes    Intravenous fluids: NS@ KVO     Vital signs stable yes    Abdominal assessment: round and soft  yes    Recommendation:  Discharge patient per MD order yes.  Family or Friend aunt  Permission to share finding with family or friend Yes
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
## 2021-12-23 ENCOUNTER — Ambulatory Visit: Payer: BLUE CROSS/BLUE SHIELD | Primary: Family Medicine

## 2021-12-31 ENCOUNTER — Encounter

## 2021-12-31 ENCOUNTER — Ambulatory Visit: Admit: 2021-12-31 | Discharge: 2022-01-03 | Payer: MEDICAID | Primary: Family Medicine

## 2021-12-31 DIAGNOSIS — E119 Type 2 diabetes mellitus without complications: Secondary | ICD-10-CM

## 2021-12-31 MED ORDER — DICYCLOMINE HCL 20 MG PO TABS
20 MG | ORAL_TABLET | Freq: Four times a day (QID) | ORAL | 1 refills | Status: DC | PRN
Start: 2021-12-31 — End: 2022-09-02

## 2021-12-31 MED ORDER — ROSUVASTATIN CALCIUM 20 MG PO TABS
20 MG | ORAL_TABLET | Freq: Every evening | ORAL | 0 refills | Status: AC
Start: 2021-12-31 — End: 2022-03-29

## 2021-12-31 MED ORDER — LISINOPRIL 20 MG PO TABS
20 MG | ORAL_TABLET | Freq: Every day | ORAL | 1 refills | Status: DC
Start: 2021-12-31 — End: 2022-08-11

## 2021-12-31 MED ORDER — VALACYCLOVIR HCL 500 MG PO TABS
500 MG | ORAL_TABLET | Freq: Two times a day (BID) | ORAL | 1 refills | Status: DC
Start: 2021-12-31 — End: 2022-05-23

## 2021-12-31 MED ORDER — METOPROLOL SUCCINATE ER 25 MG PO TB24
25 MG | ORAL_TABLET | Freq: Every evening | ORAL | 1 refills | Status: DC
Start: 2021-12-31 — End: 2022-08-22

## 2021-12-31 NOTE — Progress Notes (Incomplete)
Systems analyst for Diabetes Health  Diabetes Self-Management Education & Support Program    Reason for Referral: ***  Referral Source: Scheryl Marten, MD  Services requested: {DH (Diabetic Health) Services Requested:41751}       ASSESSMENT    From my perspective, the participant would benefit from [facility] specifically related to reducing risks, healthy eating, monitoring, taking medications, physical activity, healthy coping, and problem solving. Will adapt DSMES program to build on participant's {DH (Diabetic Health) FAOZ:30865} as noted in the Diabetes Skills, Confidence, and Preparedness Index.    During the program, we will focus on providing DSMES that specifically addresses participant's interest in {PDHProgramFocus:41749}, as shown by their reported readiness to change.    The participant would be best served by attending weekly {DH (Diabetic Health) Individual or Class:41747}.    Diabetes Self-Management Education Follow-up Visit: ***       Clinical Presentation  Alyssa [name] is a 41 y.o. African American female referred for diabetes self-management education. Participant has {DH (Diabetic Health) Type of DM HQI:69629} for {DH (Diabetic Health) Duration of DM:41753}. Family history {gen negative/positive:315881} for diabetes. Patient reports {GEN Receiving or Not Receiving:41754} DSMES services in the past. Most recent A1c value:   Lab Results   Component Value Date/Time    HBA1CPOC 9.4 01/21/2021 12:11 PM    LABA1C 10.1 10/13/2021 09:58 AM       Diabetes-related medical history:  Acute complications  {DIAGNOSES; ACUTE COMPLICATIONS DIABETES:41660}  Neurological complications  {DIAGNOSES; NEUROLOGICAL COMPLICATIONS DIABETES:41734}  Microvascular disease  {DIAGNOSES; MICROVASCULAR COMPLICATIONS DIABETES:41735}  Macrovascular disease  {DIAGNOSES; MACROVASCULAR COMPLICATIONS DIABETES:41736}  Other associated conditions     {DIAGNOSES; ASSOCIATED WITH DIABETES (Optional):41737}    Diabetes-related  medications:  Current dosing: metFORMIN - 500 MG  Toujeo Max SoloStar Sopn - 300 UNIT/ML    Blood Pressure Management  lisinopril - 20 MG      Lipid Management  rosuvastatin - 20 MG      Clot Prevention  This patient does not have an active medication from one of the medication groupers.    Learning Assessment  Learning objectives Educator assessment (12/31/2021)   Diabetes Disease Process  The participant can   A) describe diabetes in basic terms;   B) state the type of diabetes they have; &   C) state accepted blood glucose targets.       Healthy Eating  The participant can   A) identify carbohydrate foods; &   B) accurately read food labels.       Being Active  The participant can  A) state the benefits of physical activity;  B) report their current PA practices;  C) identify PA they would consider incorporating in their lives; &  D) develop an implementation plan.           Monitoring  The participant can  A) operate their blood glucose meter; &  B) describe how they log their blood glucoses to share with their provider.       Taking Medications  The participant can  A) name their diabetes medications;  B) state the purpose and dose;  C) note side effects; &  D) describe proper storage, disposal & transport (if appropriate).     Healthy Coping  The participant can    A) describe their response to diabetes diagnosis;  B) describe their specific coping mechanisms;  C) identify supportive people and/or other resources that positively support their diabetes self-care and health.        Reducing  Risks  The participant can describe the preventive measures used by providers to promote health and prevent diabetes complications.     l  Problem Solving  The participant can   A) identify signs, symptoms & treatment of hypoglycemia;    B) identify signs, symptoms & treatment of hyperglycemia;  C) describe their sick day plan; &  D) identify BG patterns to discuss with their provider.   Yes, " excess of glucose in  bloodstream"  Yes, DM type 2  No, "bs - 120 " ; patient educated        Yes, pasta, rice  Yes        No  Yes, Treadmill- 1x per week, 30 mnts - [employer]- states planning to start to do water zumba class- advised to get authorization from her provider  Yes  Yes        Yes  Yes, pt have a free style libre- 2          Yes  Yes  Yes  Yes        Yes, " Felt guilty, because her aunt gave her a warning about her diet, and she didn't follow it"  No  Yes, "aunt, children"          Yes          No, knows how to treat low bs  Yes  Yes  Yes     Characteristics to Learning   Barriers to Learning      None     Favorite Ways to Learn   []  Lecture  []  Slides  [x]  Reading []  Video-Internet  []  Cassettes/CDs/MP3's  [x]  Interactive Small Groups []  Other       Behavioral Assessment  Current self-care practices  Educator assessment (12/31/2021)   Healthy Eating   Current practices    24-hour Dietary Recall:  Breakfast: at times skip- coffee, sugar free creamer, coffee, banana nut muffin   Lunch: 12-12:30 pm - salad, grilled chicken, or crispy chicken.   Dinner: 8-9 PM- pasta, spinach, shrimp alfredo, Parmesan cheese  Snacks: banana chips, cheese - buy the snacks ; cashew , pecans  Beverages: water,coffee, sugar free sodas - not frequently   Alcohol: none        Would benefit from DSMES related to Healthy Eating: Yes    Eats a carbohydrate controlled diet: No     Stage of change: Preparation      Being Active  Current practices  How many days during the past week have you performed physical activity where your heart beats faster and your breathing is harder than normal for 30 minutes or more?  {Numbers; 1-6:10960} day(s)    How many days in a typical week do you perform activity such as this?  {Numbers; 4-5:40981} day(s)     Would benefit from [facility] related to Being Active: {YES (DEF) - [phone]::"Yes"}      Exercises 150 minutes/week: {YES (DEF) - [phone]::"Yes"}      Stage of change: {Stage of Change:41746}      Monitoring  Current practices  Do you monitor your blood sugar? Yes    How often do you monitor? >5x/day  Patient have the Free Style libre 2    What are the range of readings? 54-203 mg/dL- have sensor -       Do you know your last A1c measurement? Yes    Do you know the meaning of the A1c? Yes     Would benefit from [facility]  related to Monitoring: {YES (DEF) - NO:[phone]::"Yes"}      Uses BG readings to establish trends and understand BG patterns: {YES (DEF) - NO:[phone]::"Yes"}      Stage of change: {Stage of Change:41746}   Taking Medication  Current practices  Do you understand what your diabetes medications do? Yes    How often do you miss doses of your diabetes medications? 1-3 times/week  Using pill box now to remember    Can you afford your diabetes medications? Yes   Would benefit from [facility] related to Taking Medication: {YES (DEF) - NO:[phone]::"Yes"}      Takes medications consistently to receive full benefit: {YES (DEF) - NO:[phone]::"Yes"}      Stage of change: {Stage of Change:41746}       Healthy Coping   Current state  Diabetes Skills, Confidence and Preparedness Index:  Total score: {PDH SCPI Score SDE:41606}  Skills: ***  Confidence: ***  Preparedness: ***       Would benefit from DSMES related to Healthy Coping: {YES (DEF) - NO:[phone]::"Yes"}      Identifies specific people, organizations,etc, that actively support their diabetes self-care efforts: {YES (DEF) - NO:[phone]::"Yes"}      Stage of change: {Stage of Change:41746}     Reducing Risks  Current state  Vaccines:  Influenza: last administered date: 2022      Pneumococcal: due     Hepatitis: last administered date: march 2023      Examinations:  Eye exam:  have appt for Feb 18, 2022       Dental exam: last appointment was: June 28 ,2023    Foot exam: due      Heart Protection:  BP Readings from Last 2 Encounters:   12/21/21 (!) 142/96   12/19/21 (!) 152/91        Lab Results   Component Value Date/Time    LDLCALC 113.2  10/13/2021 09:58 AM        Kidney Protection:  No results found for: "MCA2", "MCAU", "MCAU2"     Would benefit from [name] related to Reducing Risks: Yes      Actively participates in decision-making with provider regarding secondary prevention:  Yes      Stage of change: Preparation   Problem Solving  Current state  Hypoglycemia Management:  What are signs and symptoms of hypoglycemia that you experience: Pt reported being unaware of s/s of hypoglycemia    How do you prevent hypoglycemia: patient is unaware of how to treat low blood sugars    How do you treat hypoglycemia: Rule of 15    Hyperglycemia Management:  What are signs and symptoms of hyperglycemia that you experience: Pt reported being unaware of s/s of hyperglycemia    How can you prevent hyperglycemia: patient is unaware of how to prevent high blood sugars    Sick Day Management:  What do you do differently on sick days:  Stay hydrated, Check blood glucose every 2-4 hours, Take diabetes medication as instructed by provider    Pattern Management:  Do you notice blood glucose patterns when you look at the readings in your meter or logbook? Yes    How do you use the blood glucose readings from your meter or logbook? understand how body responds to meals     Would benefit from [facility] related to Problem Solving: Yes      Articulates appropriate strategies to address hypoglycemia, hyperglycemia, sick day care and BG pattern: No      Stage of change:  Preparation      Note: Content derived from the American Association of Diabetes Educators' Diabetes Education Curriculum: A Guide to Successful Self-Management (3rd edition)      Boykin Nearing, RN on 12/31/2021 at 4:13 PM    I have personally reviewed the health record, including provider notes, laboratory data and current medications before making these care and education recommendations. The time spent in this effort is included in the total time.  Total minutes: 30    Alyssa [name], was evaluated in person at office  visit.       Diabetes Skills, Confidence & Preparedness Index (SCPI)   Thank you for completing the Skills, Confidence & Preparedness Index!  Below are your scores.  All scales and questions are out of 7.  If you would like these results emailed, please enter your email address along with some identifying patient information.  Email:    Patient Identifier:        Overall SCPI score: 6.5 Skills Score: 6.8  Low: Problem Solving(Q6) Confidence Score: 5.9  Low: Healthy Coping(Q2),Problem ZOXWRUE(A5) Preparedness Score: 6.7  Low: Healthy Eating(Q1)  Healthy Eating Score: 6.0  Low: Confidence(Q4),Preparedness(Q1) Taking Medication Score: 7.0  Low: Skills(Q2),Preparedness(Q5) Blood Sugar Monitoring Score: 7.0  Low: Skills(Q3),Skills(Q4),Skills(Q8),Confidence(Q6),Preparedness(Q6) Reducing Risks Score: 7.0  Low: Skills(Q5),Skills(Q9),Confidence(Q3),Preparedness(Q4)  Problem Solving Score: 5.3  Low: Confidence(Q7) Healthy Coping Score: 6.0  Low: Confidence(Q2) Being Active Score: 7.0  Low: Confidence(Q5),Preparedness(Q2)    Skills/Knowledge Questions  1. I know how to plan meals that have the best balance between carbohydrates, proteins and vegetables. 7  2. I know how my diabetes medications (pills, injectables and/or insulin) work in my body. 7  3. I know when to check my blood sugar if I want to see how my body responded to a meal. 7  4. I know when to check my blood sugars to determine if my medication or insulin doses are correct. 7  5. I know what to do to prevent a low blood sugar when I exercise (either before, during, or after). 7  6. When I am sick, I know what to do differently with my diabetes management. 5  7. I know how stress can affect my diabetes management. 7  8. When I look at my blood sugars over a given week, I can explain what my blood sugar pattern is. 7  9. I know what my target levels are for A1c, blood pressure and cholesterol. 7  Confidence Questions  1. I am confident that I can plan balanced  meals and snacks. 7  2. I am confident that I can manage my stress. 4  3. I am confident that I can prevent a low blood sugar during or after exercise. 7  4. I am confident that the next time I eat out, I will be able to choose foods that best keep my blood sugars in target. 5  5. I am confident I can include exercise into my schedule. 7  6. I am confident that I can use my daily blood sugars to adjust my diet, my activity, and/or my insulin. 7  7. When something out of my normal routine happens, I am confident that I can problem-solve and keep my diabetes on track. 4  Preparedness Questions  1. Within the next month, I will begin to eat more balanced meals and snacks. 5  2. Within the next month, I will choose an exercise activity and I will start fitting it into my schedule. 7  3. Within the  next month, I will make a list of stress management options that work for me. 7  4. Within the next month, I will consistently plan ahead to prevent low blood sugars. 7  5. Within the next month, I will start adjusting my insulin doses on my own. 7  6. Within the next month, I will begin making changes to my diabetes management based on my daily blood sugars (eg - eating, activity and/or insulin). 8  7. Within the next month, I will begin making changes to my diabetes management to meet my overall goals (eg - eating, activity and/or insulin). 7

## 2022-01-04 ENCOUNTER — Emergency Department: Admit: 2022-01-04 | Payer: MEDICAID | Primary: Family Medicine

## 2022-01-04 ENCOUNTER — Inpatient Hospital Stay
Admission: EM | Admit: 2022-01-04 | Discharge: 2022-01-06 | Disposition: A | Discharge status: Home / Self Care | Payer: MEDICAID | Admitting: Surgery

## 2022-01-04 DIAGNOSIS — R1084 Generalized abdominal pain: Secondary | ICD-10-CM

## 2022-01-04 DIAGNOSIS — K56600 Partial intestinal obstruction, unspecified as to cause: Principal | ICD-10-CM

## 2022-01-04 LAB — COMPREHENSIVE METABOLIC PANEL
ALT: 18 U/L (ref 12–78)
AST: 14 U/L — ABNORMAL LOW (ref 15–37)
Albumin/Globulin Ratio: 0.7 — ABNORMAL LOW (ref 1.1–2.2)
Albumin: 3.5 g/dL (ref 3.5–5.0)
Alk Phosphatase: 76 U/L (ref 45–117)
Anion Gap: 13 mmol/L (ref 5–15)
BUN: 12 MG/DL (ref 6–20)
Bun/Cre Ratio: 15 (ref 12–20)
CO2: 23 mmol/L (ref 21–32)
Calcium: 8.8 MG/DL (ref 8.5–10.1)
Chloride: 98 mmol/L (ref 97–108)
Creatinine: 0.8 MG/DL (ref 0.55–1.02)
Est, Glom Filt Rate: >60 mL/min/{1.73_m2} (ref >60)
Globulin: 5 g/dL — ABNORMAL HIGH (ref 2.0–4.0)
Glucose: 342 mg/dL — ABNORMAL HIGH (ref 65–100)
Potassium: 3.8 mmol/L (ref 3.5–5.1)
Sodium: 134 mmol/L — ABNORMAL LOW (ref 136–145)
Total Bilirubin: 0.7 MG/DL (ref 0.2–1.0)
Total Protein: 8.5 g/dL — ABNORMAL HIGH (ref 6.4–8.2)

## 2022-01-04 LAB — CBC WITH AUTO DIFFERENTIAL
Absolute Immature Granulocyte: 0.1 10*3/uL — ABNORMAL HIGH (ref 0.00–0.04)
Basophils %: 1 % (ref 0–1)
Basophils Absolute: 0.1 10*3/uL (ref 0.0–0.1)
Eosinophils %: 1 % (ref 0–7)
Eosinophils Absolute: 0.1 10*3/uL (ref 0.0–0.4)
Hematocrit: 37.9 % (ref 35.0–47.0)
Hemoglobin: 12.1 g/dL (ref 11.5–16.0)
Immature Granulocytes: 1 % — ABNORMAL HIGH (ref 0–0.5)
Lymphocytes %: 30 % (ref 12–49)
Lymphocytes Absolute: 2.7 10*3/uL (ref 0.8–3.5)
MCH: 25.7 PG — ABNORMAL LOW (ref 26.0–34.0)
MCHC: 31.9 g/dL (ref 30.0–36.5)
MCV: 80.6 FL (ref 80.0–99.0)
MPV: 11.5 FL (ref 8.9–12.9)
Monocytes %: 6 % (ref 5–13)
Monocytes Absolute: 0.5 10*3/uL (ref 0.0–1.0)
Neutrophils %: 61 % (ref 32–75)
Neutrophils Absolute: 5.5 10*3/uL (ref 1.8–8.0)
Nucleated RBCs: 0 PER 100 WBC
Platelets: 460 10*3/uL — ABNORMAL HIGH (ref 150–400)
RBC: 4.7 M/uL (ref 3.80–5.20)
RDW: 15 % — ABNORMAL HIGH (ref 11.5–14.5)
WBC: 8.9 10*3/uL (ref 3.6–11.0)
nRBC: 0 10*3/uL (ref 0.00–0.01)

## 2022-01-04 LAB — POCT GLUCOSE
POC Glucose: 246 mg/dL — ABNORMAL HIGH (ref 65–117)
POC Glucose: 224 mg/dL — ABNORMAL HIGH (ref 65–117)

## 2022-01-04 LAB — LIPASE: Lipase: 78 U/L (ref 73–393)

## 2022-01-04 LAB — POC LACTIC ACID: POC Lactic Acid: 1.75 mmol/L (ref 0.40–2.00)

## 2022-01-04 LAB — MAGNESIUM: Magnesium: 1.5 mg/dL — ABNORMAL LOW (ref 1.6–2.4)

## 2022-01-04 MED ORDER — ONDANSETRON 4 MG PO TBDP
4 MG | Freq: Three times a day (TID) | ORAL | Status: AC | PRN
Start: 2022-01-04 — End: ?

## 2022-01-04 MED ORDER — SODIUM CHLORIDE 0.9 % IV BOLUS
0.9 % | Freq: Once | INTRAVENOUS | Status: AC
Start: 2022-01-04 — End: 2022-01-04
  Administered 2022-01-04: 12:00:00 1000 mL via INTRAVENOUS

## 2022-01-04 MED ORDER — ACETAMINOPHEN 650 MG RE SUPP
650 | Freq: Four times a day (QID) | RECTAL | Status: DC | PRN
Start: 2022-01-04 — End: 2022-01-06

## 2022-01-04 MED ORDER — ACETAMINOPHEN 325 MG PO TABS
325 | Freq: Four times a day (QID) | ORAL | Status: DC | PRN
Start: 2022-01-04 — End: 2022-01-06

## 2022-01-04 MED ORDER — ONDANSETRON HCL 4 MG/2ML IJ SOLN
4 MG/2ML | Freq: Four times a day (QID) | INTRAMUSCULAR | Status: AC | PRN
Start: 2022-01-04 — End: ?
  Administered 2022-01-05 – 2022-01-06 (×3): 4 mg via INTRAVENOUS

## 2022-01-04 MED ORDER — HYDROMORPHONE HCL PF 1 MG/ML IJ SOLN
1 MG/ML | Freq: Once | INTRAMUSCULAR | Status: AC
Start: 2022-01-04 — End: 2022-01-04
  Administered 2022-01-04: 15:00:00 1 mg via INTRAVENOUS

## 2022-01-04 MED ORDER — LACTATED RINGERS IV SOLN
INTRAVENOUS | Status: AC
Start: 2022-01-04 — End: ?
  Administered 2022-01-04 – 2022-01-06 (×5): via INTRAVENOUS

## 2022-01-04 MED ORDER — KETOROLAC TROMETHAMINE 30 MG/ML IJ SOLN
30 MG/ML | INTRAMUSCULAR | Status: AC
Start: 2022-01-04 — End: 2022-01-04
  Administered 2022-01-04: 13:00:00 15 mg via INTRAVENOUS

## 2022-01-04 MED ORDER — NORMAL SALINE FLUSH 0.9 % IV SOLN
0.9 % | INTRAVENOUS | Status: AC | PRN
Start: 2022-01-04 — End: ?

## 2022-01-04 MED ORDER — HYDROMORPHONE HCL PF 1 MG/ML IJ SOLN
1 MG/ML | INTRAMUSCULAR | Status: AC
Start: 2022-01-04 — End: 2022-01-04
  Administered 2022-01-04: 17:00:00 1 mg via INTRAVENOUS

## 2022-01-04 MED ORDER — PROCHLORPERAZINE EDISYLATE 10 MG/2ML IJ SOLN
10 MG/2ML | Freq: Four times a day (QID) | INTRAMUSCULAR | Status: DC | PRN
Start: 2022-01-04 — End: 2022-01-06
  Administered 2022-01-04: 23:00:00 10 mg via INTRAVENOUS

## 2022-01-04 MED ORDER — DIATRIZOATE MEGLUMINE & SODIUM 66-10 % PO SOLN
66-10 % | Freq: Once | ORAL | Status: AC | PRN
Start: 2022-01-04 — End: ?
  Administered 2022-01-04: 13:00:00 30 mL via ORAL

## 2022-01-04 MED ORDER — NORMAL SALINE FLUSH 0.9 % IV SOLN
0.9 % | Freq: Two times a day (BID) | INTRAVENOUS | Status: AC
Start: 2022-01-04 — End: ?
  Administered 2022-01-05 (×2): 10 mL via INTRAVENOUS
  Administered 2022-01-06: 01:00:00 5 mL via INTRAVENOUS
  Administered 2022-01-06: 12:00:00 10 mL via INTRAVENOUS

## 2022-01-04 MED ORDER — HYDROMORPHONE HCL PF 1 MG/ML IJ SOLN
1 MG/ML | INTRAMUSCULAR | Status: AC
Start: 2022-01-04 — End: 2022-01-04
  Administered 2022-01-04: 12:00:00 1 mg via INTRAVENOUS

## 2022-01-04 MED ORDER — SODIUM CHLORIDE 0.9 % IV SOLN
0.9 % | INTRAVENOUS | Status: AC | PRN
Start: 2022-01-04 — End: ?

## 2022-01-04 MED ORDER — MORPHINE SULFATE (PF) 2 MG/ML IV SOLN
2 MG/ML | INTRAVENOUS | Status: DC | PRN
Start: 2022-01-04 — End: 2022-01-06
  Administered 2022-01-04 – 2022-01-06 (×9): 2 mg via INTRAVENOUS

## 2022-01-04 MED ORDER — ONDANSETRON HCL 4 MG/2ML IJ SOLN
4 MG/2ML | Freq: Once | INTRAMUSCULAR | Status: AC
Start: 2022-01-04 — End: 2022-01-04
  Administered 2022-01-04: 20:00:00 4 mg via INTRAVENOUS

## 2022-01-04 MED ORDER — ONDANSETRON HCL 4 MG/2ML IJ SOLN
4 MG/2ML | Freq: Once | INTRAMUSCULAR | Status: AC
Start: 2022-01-04 — End: 2022-01-04
  Administered 2022-01-04: 17:00:00 4 mg via INTRAVENOUS

## 2022-01-04 MED ORDER — MAGNESIUM SULFATE IN D5W 1-5 GM/100ML-% IV SOLN
1-5 GM/100ML-% | Freq: Once | INTRAVENOUS | Status: AC
Start: 2022-01-04 — End: 2022-01-04
  Administered 2022-01-04: 20:00:00 1000 mg via INTRAVENOUS

## 2022-01-04 MED ORDER — HYDROMORPHONE HCL PF 1 MG/ML IJ SOLN
1 MG/ML | INTRAMUSCULAR | Status: AC
Start: 2022-01-04 — End: 2022-01-04
  Administered 2022-01-04: 20:00:00 1 mg via INTRAVENOUS

## 2022-01-04 MED ORDER — ONDANSETRON HCL 4 MG/2ML IJ SOLN
4 MG/2ML | Freq: Once | INTRAMUSCULAR | Status: AC
Start: 2022-01-04 — End: 2022-01-04
  Administered 2022-01-04: 12:00:00 4 mg via INTRAVENOUS

## 2022-01-04 MED ORDER — ENOXAPARIN SODIUM 30 MG/0.3ML IJ SOSY
30 MG/0.3ML | Freq: Two times a day (BID) | INTRAMUSCULAR | Status: AC
Start: 2022-01-04 — End: ?
  Administered 2022-01-05 – 2022-01-06 (×3): 30 mg via SUBCUTANEOUS

## 2022-01-04 MED FILL — ONDANSETRON HCL 4 MG/2ML IJ SOLN: 4 MG/2ML | INTRAMUSCULAR | Qty: 2

## 2022-01-04 MED FILL — MAGNESIUM SULFATE IN D5W 1-5 GM/100ML-% IV SOLN: 1-5 GM/100ML-% | INTRAVENOUS | Qty: 100

## 2022-01-04 MED FILL — HYDROMORPHONE HCL 1 MG/ML IJ SOLN: 1 MG/ML | INTRAMUSCULAR | Qty: 1

## 2022-01-04 MED FILL — ENOXAPARIN SODIUM 30 MG/0.3ML IJ SOSY: 30 MG/0.3ML | INTRAMUSCULAR | Qty: 0.3

## 2022-01-04 MED FILL — LACTATED RINGERS IV SOLN: INTRAVENOUS | Qty: 1000

## 2022-01-04 MED FILL — GASTROGRAFIN 66-10 % PO SOLN: 66-10 % | ORAL | Qty: 30

## 2022-01-04 MED FILL — MORPHINE SULFATE 2 MG/ML IJ SOLN: 2 mg/mL | INTRAMUSCULAR | Qty: 1

## 2022-01-04 MED FILL — BD POSIFLUSH 0.9 % IV SOLN: 0.9 % | INTRAVENOUS | Qty: 40

## 2022-01-04 MED FILL — PROCHLORPERAZINE EDISYLATE 10 MG/2ML IJ SOLN: 10 MG/2ML | INTRAMUSCULAR | Qty: 2

## 2022-01-04 MED FILL — KETOROLAC TROMETHAMINE 30 MG/ML IJ SOLN: 30 MG/ML | INTRAMUSCULAR | Qty: 1

## 2022-01-04 MED FILL — SODIUM CHLORIDE 0.9 % IV SOLN: 0.9 % | INTRAVENOUS | Qty: 1000

## 2022-01-04 NOTE — ED Notes (Addendum)
Pt medicated as ordered     Ailene Rud, RN  01/04/22 [place], RN  01/04/22 1114

## 2022-01-04 NOTE — ED Provider Notes (Signed)
4:03 PM  Freestanding ED transfer. Care of patient taken over from Dr. Sharen Counter; H&P reviewed, and labs reviewed.  Awaiting evaluation by General surgery, Dr. Wynetta Emery, for disposition.  Labs significant for Mt 1.5.  1G magnesium sulfate ordered.  PAtient c/o returned pain and nausea.  Hydromorphone and zofran ordered.        Sharlyne Cai, MD  01/04/22 [phone]
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
## 2022-01-04 NOTE — ED Notes (Signed)
Report given to Carilyn Goodpasture RN; reviewed assessment, MAR and POC. Givne a chance to ask questions and clarify     Ailene Rud, RN  01/04/22 1504

## 2022-01-04 NOTE — ED Notes (Signed)
Pt having continued pain 9/10 medicated with Toradol as ordered.     Ellene Route, RN  01/04/22 [phone]

## 2022-01-04 NOTE — ED Notes (Signed)
Patient arrived to [facility] ED     Clint Bolder, RN  01/04/22 1552

## 2022-01-04 NOTE — ED Notes (Signed)
Attempted to call report, unable to receive at this time       Clint Bolder, RN  01/04/22 2015

## 2022-01-04 NOTE — ED Triage Notes (Signed)
Pt to room via wc; pt states " I think I have a bowel blockage" Last BM yesterday and normal; pt reports " I haven't had a BM today and no gas, abdominal pain since 10 last night spasm in nature" pt rates pain 10/10.+nausea and vomiting

## 2022-01-04 NOTE — ED Notes (Signed)
TRANSFER - OUT REPORT:    Verbal report given to Amy on Alyssa [name]  being transferred to 405 for routine progression of patient care       Report consisted of patient's Situation, Background, Assessment and   Recommendations(SBAR).     Information from the following report(s) Nurse Handoff Report was reviewed with the receiving nurse.    Kinder Fall Assessment:    Presents to emergency department  because of falls (Syncope, seizure, or loss of consciousness): No  Age > 70: No  Altered Mental Status, Intoxication with alcohol or substance confusion (Disorientation, impaired judgment, poor safety awaremess, or inability to follow instructions): No  Impaired Mobility: Ambulates or transfers with assistive devices or assistance; Unable to ambulate or transer.: No  Nursing Judgement: No          Lines:   Peripheral IV 01/04/22 Left Forearm (Active)        Opportunity for questions and clarification was provided.      Patient transported with:  tech           Clint Bolder, RN  01/04/22 2041

## 2022-01-04 NOTE — ED Notes (Signed)
Pt ambulatory to the bathroom     Ailene Rud, RN  01/04/22 1408

## 2022-01-04 NOTE — Progress Notes (Addendum)
Pt arrived to unit around 2100. Per ED RN report, general surgery already contacted.

## 2022-01-04 NOTE — ED Notes (Signed)
Pt transported by [facility] to [facility] ER      Ailene Rud, RN  01/04/22 [phone]

## 2022-01-04 NOTE — ED Notes (Signed)
Pt has had bowel resection in Nov/Dec; colonoscopy on the 5th of Sept, Pt is moaning and crying.      Ailene Rud, RN  01/04/22 [phone]

## 2022-01-04 NOTE — ED Notes (Signed)
PT given mixed gastro view and instructed to drink over the course of the next hour; Pt verbalized understanding. Pt reports pain continues to come in waves and fluctuates between a 7-8     Ailene Rud, [place]  01/04/22 0840

## 2022-01-04 NOTE — ED Notes (Signed)
PT to xray; pt reports pain is better 5/10     Ailene Rud, RN  01/04/22 [phone]

## 2022-01-04 NOTE — ED Notes (Signed)
Pt reports pain is better; she rates it a 5/10 at this time     Ailene Rud, RN  01/04/22 1146

## 2022-01-05 LAB — POCT GLUCOSE
POC Glucose: 247 mg/dL — ABNORMAL HIGH (ref 65–117)
POC Glucose: 233 mg/dL — ABNORMAL HIGH (ref 65–117)
POC Glucose: 205 mg/dL — ABNORMAL HIGH (ref 65–117)
POC Glucose: 189 mg/dL — ABNORMAL HIGH (ref 65–117)

## 2022-01-05 MED ORDER — LISINOPRIL 20 MG PO TABS
20 MG | Freq: Every day | ORAL | Status: DC
Start: 2022-01-05 — End: 2022-01-06
  Administered 2022-01-05 – 2022-01-06 (×2): 20 mg via ORAL

## 2022-01-05 MED ORDER — INSULIN LISPRO 100 UNIT/ML IJ SOLN
100 UNIT/ML | Freq: Every evening | INTRAMUSCULAR | Status: AC
Start: 2022-01-05 — End: ?

## 2022-01-05 MED ORDER — GLUCOSE 4 G PO CHEW
4 g | ORAL | Status: AC | PRN
Start: 2022-01-05 — End: ?

## 2022-01-05 MED ORDER — DEXTROSE 10 % IV SOLN
10 % | INTRAVENOUS | Status: AC | PRN
Start: 2022-01-05 — End: ?

## 2022-01-05 MED ORDER — BISACODYL 10 MG RE SUPP
10 MG | Freq: Every day | RECTAL | Status: DC
Start: 2022-01-05 — End: 2022-01-06
  Administered 2022-01-05 – 2022-01-06 (×2): 10 mg via RECTAL

## 2022-01-05 MED ORDER — INSULIN GLARGINE 100 UNIT/ML SC SOLN
100 UNIT/ML | Freq: Every evening | SUBCUTANEOUS | Status: AC
Start: 2022-01-05 — End: ?
  Administered 2022-01-05: 18:00:00 37 [IU]/kg via SUBCUTANEOUS

## 2022-01-05 MED ORDER — DEXTROSE 10 % IV BOLUS
INTRAVENOUS | Status: AC | PRN
Start: 2022-01-05 — End: ?

## 2022-01-05 MED ORDER — PANTOPRAZOLE SODIUM 40 MG PO TBEC
40 MG | Freq: Every day | ORAL | Status: DC
Start: 2022-01-05 — End: 2022-01-06
  Administered 2022-01-06: 10:00:00 40 mg via ORAL

## 2022-01-05 MED ORDER — INSULIN LISPRO 100 UNIT/ML IJ SOLN
100 UNIT/ML | Freq: Three times a day (TID) | INTRAMUSCULAR | Status: AC
Start: 2022-01-05 — End: ?
  Administered 2022-01-05 – 2022-01-06 (×3): 2 [IU] via SUBCUTANEOUS
  Administered 2022-01-06: 21:00:00 4 [IU] via SUBCUTANEOUS

## 2022-01-05 MED ORDER — DICYCLOMINE HCL 10 MG PO CAPS
10 MG | Freq: Three times a day (TID) | ORAL | Status: DC
Start: 2022-01-05 — End: 2022-01-06
  Administered 2022-01-05 – 2022-01-06 (×5): 10 mg via ORAL

## 2022-01-05 MED ORDER — GLUCAGON (RDNA) 1 MG IJ KIT
1 MG | INTRAMUSCULAR | Status: AC | PRN
Start: 2022-01-05 — End: ?

## 2022-01-05 MED FILL — ONDANSETRON HCL 4 MG/2ML IJ SOLN: 4 MG/2ML | INTRAMUSCULAR | Qty: 2

## 2022-01-05 MED FILL — LISINOPRIL 20 MG PO TABS: 20 MG | ORAL | Qty: 1

## 2022-01-05 MED FILL — DULCOLAX 10 MG RE SUPP: 10 MG | RECTAL | Qty: 1

## 2022-01-05 MED FILL — MORPHINE SULFATE 2 MG/ML IJ SOLN: 2 mg/mL | INTRAMUSCULAR | Qty: 1

## 2022-01-05 MED FILL — DICYCLOMINE HCL 10 MG PO CAPS: 10 MG | ORAL | Qty: 1

## 2022-01-05 MED FILL — LANTUS 100 UNIT/ML SC SOLN: 100 UNIT/ML | SUBCUTANEOUS | Qty: 37

## 2022-01-05 MED FILL — DEXTROSE 10 % IV SOLN: 10 % | INTRAVENOUS | Qty: 500

## 2022-01-05 MED FILL — ENOXAPARIN SODIUM 30 MG/0.3ML IJ SOSY: 30 MG/0.3ML | INTRAMUSCULAR | Qty: 0.3

## 2022-01-05 MED FILL — INSULIN LISPRO 100 UNIT/ML IJ SOLN: 100 UNIT/ML | INTRAMUSCULAR | Qty: 2

## 2022-01-05 NOTE — Progress Notes (Signed)
GENERAL SURGERY PROGRESS NOTE    Admit Date: 01/04/2022  Admitted diagnosis bowel obstruction    Subjective:     Patient stated she is feeling better this morning, still with abdominal pain but less. Denies nausea, vomiting,no chest pain, no shortness of breath. Voiding/ambulating independently. Stated she has had some flatus this morning.     Objective:     Blood pressure 139/86, pulse 88, temperature 99.3 F (37.4 C), temperature source Temporal, resp. rate 16, height 1.6 m ('5\' 3"'$ ), weight 122.5 kg (270 lb), SpO2 94 %.  Temp (24hrs), Avg:98.3 F (36.8 C), Min:98.1 F (36.7 C), Max:99.3 F (37.4 C)      _____________________  Physical Exam:     Alert and Oriented, in no acute distress.  Cardiovascular: RRR, no peripheral edema  Lungs: clear to auscultation bilaterally   Abdomen: bowel sounds hypoactive, complaint of tenderness with palpation to mid abdomen above umbilicus with previous surgical scar      Assessment:   Principal Problem:    Bowel obstruction (HCC)  Resolved Problems:    * No resolved hospital problems. *      CT scan abdomen/pelvis 01/04/2022: IMPRESSION: There are few loops of small bowel in the right abdomen right lower quadrant in the mid to distal ileum which is mildly dilated with fecal stasis suspicious for small bowel obstruction. The terminal ileum is not dilated.    Plan:     -Patient to start clear liquids.  -Bowel regime with Bisacodyl suppository   -Encouraged ambulation   -Patient discussed with Dr. Wynetta Emery     Data Review:    Recent Labs     01/04/22  0746   WBC 8.9   HGB 12.1   HCT 37.9   PLT 460*     Recent Labs     01/04/22  0746   NA 134*   K 3.8   CL 98   CO2 23   BUN 12   MG 1.5*   ALT 18     No results for input(s): "AML" in the last 72 hours.    Invalid input(s): "LPSE"        ______________________  Medications:    Current Facility-Administered Medications   Medication Dose Route Frequency    bisacodyl (DULCOLAX) suppository 10 mg  10 mg Rectal Daily    dicyclomine (BENTYL)  capsule 10 mg  10 mg Oral TID AC    [START ON 01/06/2022] pantoprazole (PROTONIX) tablet 40 mg  40 mg Oral QAM AC    diatrizoate meglumine-sodium (GASTROGRAFIN) 66-10 % solution 30 mL  30 mL Oral ONCE PRN    sodium chloride flush 0.9 % injection 5-40 mL  5-40 mL IntraVENous 2 times per day    sodium chloride flush 0.9 % injection 5-40 mL  5-40 mL IntraVENous PRN    0.9 % sodium chloride infusion   IntraVENous PRN    enoxaparin Sodium (LOVENOX) injection 30 mg  30 mg SubCUTAneous BID    ondansetron (ZOFRAN-ODT) disintegrating tablet 4 mg  4 mg Oral Q8H PRN    Or    ondansetron (ZOFRAN) injection 4 mg  4 mg IntraVENous Q6H PRN    acetaminophen (TYLENOL) tablet 650 mg  650 mg Oral Q6H PRN    Or    acetaminophen (TYLENOL) suppository 650 mg  650 mg Rectal Q6H PRN    lactated ringers IV soln infusion   IntraVENous Continuous    morphine (PF) injection 2 mg  2 mg IntraVENous Q2H PRN  prochlorperazine (COMPAZINE) injection 10 mg  10 mg IntraVENous Q6H PRN       Trixie Dredge, APRN - CNP  01/05/2022

## 2022-01-05 NOTE — Progress Notes (Signed)
Ultrasound IV by Jerry Caras. RN :  Procedure Note    Ultrasound IV education provided to patient. Opportunities for questions given.     Ultrasound used for PIV placement:  20 gauge 8  cm PowerGlide Pro 8cm  left cephalic location.  2 X Attempt(s).    Flushed with ease; vigorous blood return.     Procedure tolerated well. Primary RN aware of IV placement and added to LDA.      Markus Jarvis, RN

## 2022-01-05 NOTE — Plan of Care (Signed)
Problem: Discharge Planning  Goal: Discharge to home or other facility with appropriate resources  Outcome: Progressing     Problem: Pain  Goal: Verbalizes/displays adequate comfort level or baseline comfort level  Outcome: Progressing     Problem: Chronic Conditions and Co-morbidities  Goal: Patient's chronic conditions and co-morbidity symptoms are monitored and maintained or improved  Outcome: Progressing

## 2022-01-05 NOTE — H&P (Unsigned)
[place]  HISTORY AND PHYSICAL    Name:  Alyssa [name], Alyssa [name]  MR#:  [id number]  DOB:  [birth year]  ACCOUNT #:  [id number]  ADMIT DATE:  01/04/2022      PRIMARY CARE PROVIDER:  Colvin Caroli. Aleene Davidson, MD    CHIEF COMPLAINT:  Admission note for obstruction.    HISTORY OF PRESENT ILLNESS:  The patient is a 41 year old female who has persistent episode of constipation and intermittent stasis in the right lower quadrant ileum.  She was taken to the emergency department for continued cramping and worsening abdominal pain.  CT scan of stasis as demonstrated in the prior CT scans.  The patient does feel better with bowel rest and antispasmodics.  She is also on bowel regimen.  General Surgery service was consulted for admission.    PAST MEDICAL HISTORY:  1.  Obesity.  2.  Anemia.  3.  Chronic pain.  4.  Diabetes.  5.  Dry eyes.  6.  Endometriosis.  7.  Gastroparesis.  8.  Gastroesophageal reflux disease.  9.  Hyperlipidemia.  10.  Hypertension.  11.  Migraine.  12.  Ovarian cyst disease.  13.  Peptic ulcer disease.    PAST SURGICAL HISTORY:  1.  Laparoscopic appendectomy.  2.  C-section x2.  3.  ***.  4.  Incisional hernia repair in 2018.  5.  Lysis of adhesions small bowel obstruction x2 small bowel obstruction last year.    ALLERGIES:  1.  GLIPIZIDE.  2.  HYDRALAZINE.  3.  ***.  4.  TRAMADOL.  5.  CODEINE.  6.  IODINE.  7.  POVIDONE.  8.  SULFA.    HOME MEDICATIONS:  1.  Trulicity.  2.  Famotidine.  3.  Insulin.  4.  Lisinopril.  5.  Metformin.  6.  Toprol.  7.  Ondansetron.  8.  Statin.  9.  Valacyclovir.    SOCIAL HISTORY:  She is widowed.  Nonsmoker.    FAMILY HISTORY:  1.  Anemia.  2.  Breast cancer.  3.  Stroke.  4.  Diabetes.    REVIEW OF SYSTEMS:  Positive for above complaints.  Negative general, HEENT, respiratory, cardiovascular, genitourinary, musculoskeletal, neurologic, psychiatric, endocrine, hematology.    PHYSICAL EXAMINATION:  VITAL SIGNS:  Reviewed.  CONSTITUTIONAL:  An age-appropriate  female, in no apparent distress.  HEENT:  Normocephalic, atraumatic.  NECK:  Supple.  Trachea midline.  CHEST:  Clear.  HEART:  Rate is regular.  ABDOMEN:  Soft, mild distention.  No evidence of peritonitis.  MUSCULOSKELETAL:  No clubbing, cyanosis, or edema.  SKIN:  Clear.  No rashes or lesions.  PSYCHIATRIC:  Appropriate to questioning and pleasant.  NEUROLOGIC:  Grossly nonfocal.    RADIOLOGY:  Reviewed.    LABORATORY DATA:  None.    ASSESSMENT:  *** stasis in distal small bowel loops, constipation.    PLAN:  1.  Bowel regimen.  2.  ***  3.  ***  5.  Antispasmodics.  6.  DVT and GI prophylaxis, suspect it would resolve on her own.  Continue to follow along while in-house.  Consult placed for inpatient diabetes management team.    Total time of consultation including review of the history, physical exam findings, and discussion of nonoperative management was approximately 30 minutes, in which 10 minutes was face-to-face time with the patient.      Arnetha Courser, MD      [id number]  D:  01/05/2022 10:08  T:  01/05/2022  17:48  JOB #:  [id number]  CC:  Arnetha Courser, MD       Madelin Rear, MD

## 2022-01-05 NOTE — Progress Notes (Signed)
Note dictated. Bowel regimen, start clears.  No role for antibiotics.     Otila Kluver, MD
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
## 2022-01-06 LAB — POCT GLUCOSE
POC Glucose: 220 mg/dL — ABNORMAL HIGH (ref 65–117)
POC Glucose: 219 mg/dL — ABNORMAL HIGH (ref 65–117)
POC Glucose: 239 mg/dL — ABNORMAL HIGH (ref 65–117)
POC Glucose: 256 mg/dL — ABNORMAL HIGH (ref 65–117)

## 2022-01-06 LAB — HEMOGLOBIN A1C
Hemoglobin A1C: 11.8 % — ABNORMAL HIGH (ref 4.0–5.6)
eAG: 292 mg/dL

## 2022-01-06 MED ORDER — KETOROLAC TROMETHAMINE 30 MG/ML IJ SOLN
30 MG/ML | Freq: Four times a day (QID) | INTRAMUSCULAR | Status: DC | PRN
Start: 2022-01-06 — End: 2022-01-06
  Administered 2022-01-06: 17:00:00 30 mg via INTRAVENOUS

## 2022-01-06 MED ORDER — DICYCLOMINE HCL 10 MG PO CAPS
10 MG | ORAL_CAPSULE | Freq: Three times a day (TID) | ORAL | 3 refills | Status: DC
Start: 2022-01-06 — End: 2022-01-06

## 2022-01-06 MED ORDER — OXYCODONE HCL 5 MG PO TABS
5 MG | ORAL | Status: DC | PRN
Start: 2022-01-06 — End: 2022-01-06

## 2022-01-06 MED ORDER — DICYCLOMINE HCL 10 MG PO CAPS
10 MG | ORAL_CAPSULE | Freq: Three times a day (TID) | ORAL | 0 refills | Status: DC | PRN
Start: 2022-01-06 — End: 2022-09-02

## 2022-01-06 MED FILL — ENOXAPARIN SODIUM 30 MG/0.3ML IJ SOSY: 30 MG/0.3ML | INTRAMUSCULAR | Qty: 0.3

## 2022-01-06 MED FILL — MORPHINE SULFATE 2 MG/ML IJ SOLN: 2 mg/mL | INTRAMUSCULAR | Qty: 1

## 2022-01-06 MED FILL — MORPHINE SULFATE 2 MG/ML IJ SOLN: 2 mg/mL | INTRAMUSCULAR | Qty: 1 | Fill #0

## 2022-01-06 MED FILL — ONDANSETRON HCL 4 MG/2ML IJ SOLN: 4 MG/2ML | INTRAMUSCULAR | Qty: 2

## 2022-01-06 NOTE — Plan of Care (Signed)
Problem: Discharge Planning  Goal: Discharge to home or other facility with appropriate resources  Outcome: Progressing     Problem: Pain  Goal: Verbalizes/displays adequate comfort level or baseline comfort level  01/06/2022 1120 by France Ravens, LPN  Outcome: Progressing  01/06/2022 0831 by Mercer Pod, RN  Outcome: Progressing     Problem: Chronic Conditions and Co-morbidities  Goal: Patient's chronic conditions and co-morbidity symptoms are monitored and maintained or improved  Outcome: Progressing

## 2022-01-06 NOTE — Progress Notes (Signed)
Nurse handed patient a copy of discharge instructions which have been read and explained to patient. New medications read and explained. Patient verbalized understanding. Patient aware that prescriptions have been electronically sent to pharmacy. Opportunity for questions and clarification offered. Removed patients IV access with no complications, VS stable, and patient sent with all belongings.

## 2022-01-06 NOTE — Plan of Care (Signed)
Problem: Discharge Planning  Goal: Discharge to home or other facility with appropriate resources  01/06/2022 1435 by France Ravens, LPN  Outcome: Adequate for Discharge  01/06/2022 1120 by France Ravens, LPN  Outcome: Progressing     Problem: Pain  Goal: Verbalizes/displays adequate comfort level or baseline comfort level  01/06/2022 1435 by France Ravens, LPN  Outcome: Adequate for Discharge  01/06/2022 1120 by France Ravens, LPN  Outcome: Progressing  01/06/2022 0831 by Mercer Pod, RN  Outcome: Progressing     Problem: Chronic Conditions and Co-morbidities  Goal: Patient's chronic conditions and co-morbidity symptoms are monitored and maintained or improved  01/06/2022 1435 by France Ravens, LPN  Outcome: Adequate for Discharge  01/06/2022 1120 by France Ravens, LPN  Outcome: Progressing

## 2022-01-06 NOTE — Discharge Summary (Signed)
Discharge Summary    Patient: Alyssa [name]               Sex: female          DOA: 01/04/2022  7:16 AM       Date of Birth:  02-Sep-1980      Age:  41 y.o.        LOS:  LOS: 2 days                Discharge Date:      Admission Diagnoses: Hypomagnesemia [E83.42]  Bowel obstruction ([place]) [K56.609]  Generalized abdominal pain [R10.84]  Partial intestinal obstruction, unspecified cause ([place]) [K56.600]    Discharge Diagnoses:  Same      Discharge Condition: Stable    Hospital Course: Unremarkable. Diagnosis bowel obstruction with medical management with bowel rest.  Resolution of bowel obstruction without surgical intervention, abdominal pain resolved. Discharge to home in stable condition.      Consults: Endocrinology for diabetes management     Significant Diagnostic Studies: See full electronic record.     Discharge Medications:     Current Discharge Medication List        START taking these medications    Details   dicyclomine (BENTYL) 20 MG capsule Take 2 capsules by mouth 3 times daily as needed (abdominal cramping)  Qty: 90 capsule, Refills: 0           CONTINUE these medications which have NOT CHANGED    Details   valACYclovir (VALTREX) 500 MG tablet Take 1 tablet by mouth 2 times daily  Qty: 180 tablet, Refills: 1      dicyclomine (BENTYL) 20 MG tablet Take 1 tablet by mouth every 6 hours as needed (ab cramping)  Qty: 360 tablet, Refills: 1    Associated Diagnoses: Abdominal cramping      lisinopril (PRINIVIL;ZESTRIL) 20 MG tablet Take 1 tablet by mouth daily  Qty: 90 tablet, Refills: 1    Associated Diagnoses: Type 2 diabetes mellitus treated with insulin ([place]); Essential hypertension      metoprolol succinate (TOPROL XL) 25 MG extended release tablet Take 1 tablet by mouth nightly  Qty: 90 tablet, Refills: 1    Associated Diagnoses: Essential hypertension      rosuvastatin (CRESTOR) 20 MG tablet Take 1 tablet by mouth nightly  Qty: 90 tablet, Refills: 0    Associated Diagnoses: Essential hypertension; Type 2  diabetes mellitus with hyperglycemia, without long-term current use of insulin ([place]); Pure hyperglyceridemia      ondansetron (ZOFRAN-ODT) 4 MG disintegrating tablet Take 1 tablet by mouth 3 times daily as needed for Nausea or Vomiting  Qty: 21 tablet, Refills: 0      famotidine (PEPCID) 20 MG tablet Take 1 tablet by mouth 2 times daily  Qty: 60 tablet, Refills: 0      Dulaglutide (TRULICITY) 4.5 IR/5.1OA SOPN Inject 4.5 mg into the skin every 7 days  Qty: 4 Adjustable Dose Pre-filled Pen Syringe, Refills: 3      Alcohol Swabs PADS       omeprazole (PRILOSEC) 40 MG delayed release capsule       Continuous Blood Gluc Sensor (FREESTYLE LIBRE 2 SENSOR) MISC Check blood sugar 1-4 times daily while titrating insulin and follow physician instructions  Qty: 6 each, Refills: 1    Associated Diagnoses: Type 2 diabetes mellitus treated with insulin (HCC)      butalbital-APAP-caffeine (FIORICET) 50-300-40 MG CAPS per capsule Take 1 capsule by mouth every 4 hours  as needed for Headaches  Qty: 20 capsule, Refills: 0      Insulin Glargine, 2 Unit Dial, (TOUJEO MAX SOLOSTAR) 300 UNIT/ML SOPN Inject 80 Units into the skin      metFORMIN (GLUCOPHAGE-XR) 500 MG extended release tablet Take 4 tablets by mouth Daily with supper             Activity/Diet: Advance physical activities as tolerated. Advance diet of as tolerated.    Follow-up: Patient to have follow up appointment with surgeon office in 10-14 days. Patient to also follow up with primary care who current provides diabetes management     Trixie Dredge, [place]  Office:  [phone]  Fax:  [phone]

## 2022-01-06 NOTE — Progress Notes (Addendum)
GENERAL SURGERY PROGRESS NOTE    Admit Date: 01/04/2022  Admitted diagnosis bowel obstruction    Subjective:     Patient stated she is feeling better this morning, still with abdominal soreness. Denies nausea, vomiting, no chest pain, no shortness of breath. Voiding/ambulating independently. Tolerating full liquid, wants to advance diet. Stated passing flatus and had bowel movement last night, stated had a hard stool then  loose.     Objective:     Blood pressure (!) 148/90, pulse 81, temperature 98.2 F (36.8 C), temperature source Oral, resp. rate 18, height 1.6 m ('5\' 3"'$ ), weight 122.5 kg (270 lb), SpO2 97 %.  Temp (24hrs), Avg:98.2 F (36.8 C), Min:97.7 F (36.5 C), Max:98.6 F (37 C)      _____________________  Physical Exam:     Alert and Oriented, in no acute distress.  Cardiovascular: RRR, no peripheral edema  Lungs: clear to auscultation bilaterally   Abdomen: bowel sounds present, complaint of tenderness with palpation to mid abdomen above umbilicus with previous surgical scar      Assessment:   Principal Problem:    Bowel obstruction (HCC)  Resolved Problems:    * No resolved hospital problems. *      CT scan abdomen/pelvis 01/04/2022: IMPRESSION: There are few loops of small bowel in the right abdomen right lower quadrant in the mid to distal ileum which is mildly dilated with fecal stasis suspicious for small bowel obstruction. The terminal ileum is not dilated.    Plan:     -Patient to advance to regular diet with recommendations of diabetes management  -Discontinue Bisacodyl    -D/C maintenance IV fluids.  -Transition to oral pain medication. Toradol ordered as needed for pain   -Encouraged ambulation and PO intake  -Possible discharge home later today.  -Patient discussed with Dr. Wynetta Emery     Data Review:    Recent Labs     01/04/22  0746   WBC 8.9   HGB 12.1   HCT 37.9   PLT 460*     Recent Labs     01/04/22  0746   NA 134*   K 3.8   CL 98   CO2 23   BUN 12   MG 1.5*   ALT 18     No results for  input(s): "AML" in the last 72 hours.    Invalid input(s): "LPSE"        ______________________  Medications:    Current Facility-Administered Medications   Medication Dose Route Frequency    ketorolac (TORADOL) injection 30 mg  30 mg IntraVENous Q6H PRN    bisacodyl (DULCOLAX) suppository 10 mg  10 mg Rectal Daily    dicyclomine (BENTYL) capsule 10 mg  10 mg Oral TID AC    pantoprazole (PROTONIX) tablet 40 mg  40 mg Oral QAM AC    glucose chewable tablet 16 g  4 tablet Oral PRN    dextrose bolus 10% 125 mL  125 mL IntraVENous PRN    Or    dextrose bolus 10% 250 mL  250 mL IntraVENous PRN    glucagon injection 1 mg  1 mg SubCUTAneous PRN    dextrose 10 % infusion   IntraVENous Continuous PRN    insulin glargine (LANTUS) injection vial 37 Units  0.3 Units/kg SubCUTAneous Nightly    insulin lispro (HUMALOG) injection vial 0-8 Units  0-8 Units SubCUTAneous TID WC    insulin lispro (HUMALOG) injection vial 0-4 Units  0-4 Units SubCUTAneous Nightly  lisinopril (PRINIVIL;ZESTRIL) tablet 20 mg  20 mg Oral Daily    diatrizoate meglumine-sodium (GASTROGRAFIN) 66-10 % solution 30 mL  30 mL Oral ONCE PRN    sodium chloride flush 0.9 % injection 5-40 mL  5-40 mL IntraVENous 2 times per day    sodium chloride flush 0.9 % injection 5-40 mL  5-40 mL IntraVENous PRN    0.9 % sodium chloride infusion   IntraVENous PRN    enoxaparin Sodium (LOVENOX) injection 30 mg  30 mg SubCUTAneous BID    ondansetron (ZOFRAN-ODT) disintegrating tablet 4 mg  4 mg Oral Q8H PRN    Or    ondansetron (ZOFRAN) injection 4 mg  4 mg IntraVENous Q6H PRN    acetaminophen (TYLENOL) tablet 650 mg  650 mg Oral Q6H PRN    Or    acetaminophen (TYLENOL) suppository 650 mg  650 mg Rectal Q6H PRN    morphine (PF) injection 2 mg  2 mg IntraVENous Q2H PRN    prochlorperazine (COMPAZINE) injection 10 mg  10 mg IntraVENous Q6H PRN       Trixie Dredge, APRN - CNP  01/06/2022

## 2022-01-06 NOTE — Care Coordination-Inpatient (Signed)
01/06/2022  11:06 AM  Care Management Progress Note      ICD-10-CM    1. Generalized abdominal pain  R10.84       2. Partial intestinal obstruction, unspecified cause ([place])  K56.600       3. Hypomagnesemia  E83.42           RUR:  9%  Risk Level: '[x]'$ Low '[]'$ Moderate '[]'$ High  Value-based purchasing: '[x]'$  Yes '[]'$  No  Bundle patient: '[]'$  Yes '[x]'$  No   Specify:     Transition of care plan:  Discharge pending diet tolerance.  Home. No CM needs indicated.   Outpatient follow-up.  Pt's mother to transport.        01/06/22 1104   Service Assessment   Patient Orientation Alert and Oriented   Cognition Alert   History Provided By Patient   Primary [place] Parent   Patient's Healthcare Decision Maker is: Legal Next of Kin   PCP Verified by CM Yes   Last Visit to PCP Within last 6 months   Prior Functional Level Independent in ADLs/IADLs   Current Functional Level Independent in ADLs/IADLs   Can patient return to prior living arrangement Yes   Ability to make needs known: Good   Family able to assist with home care needs: Yes   Would you like for me to discuss the discharge plan with any other family members/significant others, and if so, who? No   Financial Resources [employer] None   Social/Functional History   Lives With Family   Type of [place] Help From Family   ADL Assistance Independent   Homemaking Assistance Independent   Ambulation Assistance Independent   Transfer Assistance Independent   Active Driver Yes   Mode of Scientist, product/process development   Discharge Planning   Type of Residence House   Living Arrangements Family Members   Current Services Prior To Admission None   DME Ordered? No   Potential Assistance Purchasing Medications No   Type of Home Care Services None   Patient expects to be discharged to: [place] [facility]charge   Services At/After Discharge None   Mode of Transport at Discharge Other (see comment)   Confirm Follow Up  Transport Family

## 2022-01-06 NOTE — Plan of Care (Signed)
Problem: Pain  Goal: Verbalizes/displays adequate comfort level or baseline comfort level  Outcome: Progressing

## 2022-01-06 NOTE — Progress Notes (Signed)
Alyssa [name] is a 41 year old female, with Type 2 Diabetes on metformin, Trulicity and high dose Toujeo, who is admitted with a small bowel obstruction.  The Program for Diabetes Health has been consulted to assist in glycemic management and advanced diabetes management assessment this admission.  Alyssa [name] has a significant history of multiple bowel obstructions with adhesions including a previous partial colectomy (2013) and bowel resection (2021).  She has unfortunately lived with chronic hyperglycemia for many years (A1C range 8.0% to 12.7%) contributing to worsening gastroparesis. Her GLP-1, Trulicity, is not the cause of this obstruction, but can contribute to delayed digestion and constipation.  Her last A1C in June this year was 10.3%.  Her glucose has been elevated 233-342 on clear liquids.    Patient is being managed expectantly with gradual improvement. Now passing bowels and tolerating PO. Resting comfortably in bed this morning.Currently on 37 units glargine with reasonable glucose control.    Examination  Obese woman in NAD  BP 133/74  Pulse 89  AF  99% RA    Recent Labs  Glucose 219 (range 219-247)  HbA1c 11.8%  Creat 0.8    Impression  Type 2 diabetes with poor glucose control with HbA1c 11.8%. I do question adherance to the 80 units Toujeo.  SBO. Improving  Obesity  Plan  Would increase Lantus to 50 units (0.4 units/kg) daily (if patient stays in hospital).   Agree with holding GLP1 for now  Rest per team    Total time 35 minutes more than 50% of which was in direct patient care and/or care coordination.

## 2022-01-06 NOTE — Progress Notes (Signed)
GENERAL SURGERY PROGRESS NOTE    Admit Date: 01/04/2022  Admitted diagnosis bowel obstruction    Subjective:     Patient stated she is feeling better this morning, still with abdominal soreness. Denies nausea, vomiting, no chest pain, no shortness of breath. Voiding/ambulating independently. Tolerating full liquid, wants to advance diet. Stated passing flatus and had bowel movement last night, stated had a hard stool then  loose.     Feels better.  Denies worse pain.  Eating simple diet.     Objective:     Blood pressure 133/74, pulse 89, temperature 98.2 F (36.8 C), temperature source Oral, resp. rate 18, height 1.6 m ('5\' 3"'$ ), weight 122.5 kg (270 lb), SpO2 99 %.  Temp (24hrs), Avg:98.3 F (36.8 C), Min:98.1 F (36.7 C), Max:98.6 F (37 C)      _____________________  Physical Exam:     Alert and Oriented, in no acute distress.  Cardiovascular: RRR, no peripheral edema  Lungs: clear to auscultation bilaterally   Abdomen: bowel sounds present, complaint of tenderness with palpation to mid abdomen above umbilicus with previous surgical scar      Assessment:   Principal Problem:    Bowel obstruction (HCC)  Resolved Problems:    * No resolved hospital problems. *      CT scan abdomen/pelvis 01/04/2022: IMPRESSION: There are few loops of small bowel in the right abdomen right lower quadrant in the mid to distal ileum which is mildly dilated with fecal stasis suspicious for small bowel obstruction. The terminal ileum is not dilated.    Plan:     -Patient to advance to regular diet with recommendations of diabetes management  -Discontinue Bisacodyl    -D/C maintenance IV fluids.  -Transition to oral pain medication. Toradol ordered as needed for pain   -Encouraged ambulation and PO intake  -Possible discharge home later today.  -Patient discussed with Dr. Wynetta Emery     Agree with NP Paige assessment and plain.  Soft.  Hopefully home today.     Data Review:    Recent Labs     01/04/22  0746   WBC 8.9   HGB 12.1   HCT 37.9    PLT 460*       Recent Labs     01/04/22  0746   NA 134*   K 3.8   CL 98   CO2 23   BUN 12   MG 1.5*   ALT 18       No results for input(s): "AML" in the last 72 hours.    Invalid input(s): "LPSE"        ______________________  Medications:    Current Facility-Administered Medications   Medication Dose Route Frequency    ketorolac (TORADOL) injection 30 mg  30 mg IntraVENous Q6H PRN    oxyCODONE (ROXICODONE) immediate release tablet 5 mg  5 mg Oral Q4H PRN    dicyclomine (BENTYL) capsule 10 mg  10 mg Oral TID AC    pantoprazole (PROTONIX) tablet 40 mg  40 mg Oral QAM AC    glucose chewable tablet 16 g  4 tablet Oral PRN    dextrose bolus 10% 125 mL  125 mL IntraVENous PRN    Or    dextrose bolus 10% 250 mL  250 mL IntraVENous PRN    glucagon injection 1 mg  1 mg SubCUTAneous PRN    dextrose 10 % infusion   IntraVENous Continuous PRN    insulin glargine (LANTUS) injection vial 37 Units  0.3 Units/kg SubCUTAneous Nightly    insulin lispro (HUMALOG) injection vial 0-8 Units  0-8 Units SubCUTAneous TID WC    insulin lispro (HUMALOG) injection vial 0-4 Units  0-4 Units SubCUTAneous Nightly    lisinopril (PRINIVIL;ZESTRIL) tablet 20 mg  20 mg Oral Daily    diatrizoate meglumine-sodium (GASTROGRAFIN) 66-10 % solution 30 mL  30 mL Oral ONCE PRN    sodium chloride flush 0.9 % injection 5-40 mL  5-40 mL IntraVENous 2 times per day    sodium chloride flush 0.9 % injection 5-40 mL  5-40 mL IntraVENous PRN    0.9 % sodium chloride infusion   IntraVENous PRN    enoxaparin Sodium (LOVENOX) injection 30 mg  30 mg SubCUTAneous BID    ondansetron (ZOFRAN-ODT) disintegrating tablet 4 mg  4 mg Oral Q8H PRN    Or    ondansetron (ZOFRAN) injection 4 mg  4 mg IntraVENous Q6H PRN    acetaminophen (TYLENOL) tablet 650 mg  650 mg Oral Q6H PRN    Or    acetaminophen (TYLENOL) suppository 650 mg  650 mg Rectal Q6H PRN    prochlorperazine (COMPAZINE) injection 10 mg  10 mg IntraVENous Q6H PRN       Otila Kluver, MD  01/06/2022

## 2022-01-07 ENCOUNTER — Ambulatory Visit: Payer: MEDICAID | Primary: Family Medicine

## 2022-01-10 MED ORDER — METFORMIN HCL ER 500 MG PO TB24
500 MG | ORAL_TABLET | Freq: Every day | ORAL | 0 refills | Status: DC
Start: 2022-01-10 — End: 2022-03-14

## 2022-01-10 NOTE — Telephone Encounter (Signed)
From: [name]  To: Dr. Madelin Rear  Sent: 01/07/2022 10:56 PM EDT  Subject: Metformin    I know you won't get this until Monday but I am out of Metformin and I am not able to request it for refill through [facility]. Not sure why but I do need a refill sent to the [facility] on file.

## 2022-01-12 NOTE — Progress Notes (Signed)
Pharmacy Pop Care Documentation:     Alyssa [name] is being removed from the diabetes management program for the following reason(s):  Maintaining [place] - Per [place] patient's benefits have been [place] Only    Program: [place] in place:  No  Gap Closed?: Yes   Time Spent (min): 5

## 2022-01-13 ENCOUNTER — Ambulatory Visit: Admit: 2022-01-13 | Discharge: 2022-01-13 | Payer: MEDICAID | Attending: Family Medicine | Primary: Family Medicine

## 2022-01-13 DIAGNOSIS — E1121 Type 2 diabetes mellitus with diabetic nephropathy: Secondary | ICD-10-CM

## 2022-01-13 MED ORDER — TOUJEO MAX SOLOSTAR 300 UNIT/ML SC SOPN
300 UNIT/ML | Freq: Every evening | SUBCUTANEOUS | 5 refills | Status: DC
Start: 2022-01-13 — End: 2022-09-09

## 2022-01-13 MED ORDER — BUTALBITAL-APAP-CAFFEINE 50-300-40 MG PO CAPS
50-300-40 MG | ORAL_CAPSULE | ORAL | 0 refills | Status: AC | PRN
Start: 2022-01-13 — End: 2022-03-04

## 2022-01-13 MED ORDER — FREESTYLE LIBRE 3 SENSOR MISC
11 refills | Status: DC
Start: 2022-01-13 — End: 2022-09-02

## 2022-01-13 MED ORDER — INSULIN LISPRO (1 UNIT DIAL) 100 UNIT/ML SC SOPN
100 UNIT/ML | Freq: Three times a day (TID) | SUBCUTANEOUS | 1 refills | Status: DC
Start: 2022-01-13 — End: 2022-05-06

## 2022-01-13 NOTE — Patient Instructions (Addendum)
Please continue to see diabetic ed  Continue metformin  Stop trulicity, will not use GLP given recurrent abdominal symptoms requiring hospitalization  Moved her [place] up to 160, not having hypoglycemia, will continue  Use freestyle  Start adding mealtime insulin  Going to learn carb counting with education department and may also for now at least use correction scale, see instructions below    Going to change insurance--will refer to endo when new insurance is active    This fall recommend covid and flu vaccines be updated    Plan to discuss migraine control at a future visit    Insulin Instructions   Instructions for Mealtime Novolog  (or Humalog) Insulin     This is the rapid-acting insulin, used at mealtime to prevent a high BG after you eat.  Check your BG before each meal.    If your BG is 100 or higher, inject Novolog right before eating.     If your BG is 80 - 100, inject Novolog right after eating.  If your BG is lower than 80, or you have symptoms of a low BG, treat the low BG first, then eat your meal and take the Novolog after eating.      The dose of Novolog is based on the amount of carbohydrate in your meal.  Take 1 unit for every 5 grams of carbohydrate:                 Total carb in meal       5 grams = Novolog 1 unit                                                  10 grams = Novolog 2 units                                                  15 grams = Novolog 3 units                                                  20 grams = Novolog 4 units                                                  25 grams = Novolog 5 units                                                  30 grams = Novolog 6 units                                                  35 grams = Novolog 7 units  40 grams = Novolog 8 units     If you should forget to take your Novolog with a meal, take the usual dose if less than an hour has passed since you ate your meal.  If more than an hour has  passed since you ate, check your BG and use the correction scale below.                  Instructions for Correction Novolog Insulin     This is the rapid-acting insulin, used to correct a high BG, and bring it back down to the target.  Inject correction Novolog if your BG is higher than 150 before meals or at bedtime.  This dose can be added to the mealtime dose and given all as one injection.  At bedtime, take this dose by itself.     ADD the following extra insulin if your before-meal BG is higher than 150:  BG 150-170 take 1 extra unit  BG 171-190 take 2 extra units  BG 191-210 take 3 extra units   BG 211-230 take 4 extra units   BG 231-250 take 5 extra units  BG 251-270 take 6 extra units   BG 271-290 take 7 extra units   BG 291-310 take 8 extra units  BG 311-330 take 9 extra units AND SO ON AND SO FORTH...        If you check your BG three - four hours after a meal - it should be in the target of 80 - 150.  If the BG is higher than 150, take another dose of Novolog according to the scale above.  You may repeat this every 3 - 4 hours until your BG is less than 150.  Do not take Novolog doses closer than 3 - 4 hours apart.  Call your doctor if your BG does not come down after three extra doses.        Treatment of Low Blood Sugar (Hypoglycemia)     If your BG is lower than 80, you are likely to feel shaky, sweaty and lightheaded.  This is a signal that your body needs more sugar.  Quickly eat or drink a small serving of something sweet, such as:              4 ounces fruit juice or regular (not diet) soda              6 lifesavers              small box of raisins              4 glucose tablets  (~15 gm of glucose)     NB: If your BG is very low <50, you can double the amount above or take 30 gm of glucose gel/tablets.     Sit and rest and you should feel better within a few minutes.  Once you are feeling better, try to determine why your BG was so low.  Common causes of hypoglycemia include skipping a meal,  lots of exercise, too much insulin or any combination of these things.  Understanding the cause my help you to avoid another low BG in the future.

## 2022-01-13 NOTE — Progress Notes (Signed)
Chief Complaint   Patient presents with    Diabetes     Pt's Blood sugar today is 134  Plan:   Increase insulin 2u ever 2 days to fasting sugar <120 over the next few weeks- pt not sure what unit amount she is at  [facility] 1 mo  Continue mtfromin and trulicity as they are for now    Abnormal Lab     A1C on 01/05/2022- 11.8    Follow-Up from Hospital     Admission Diagnoses: Hypomagnesemia [E83.42]  Bowel obstruction ([place]) [K56.609]  Generalized abdominal pain [R10.84]  Partial intestinal obstruction, unspecified cause ([place]) [K56.600]    Follow-up: Patient to have follow up appointment with surgeon office in 10-14 days. Patient to also follow up with primary care who current provides diabetes management     Pt has not yet follow up with surgeon

## 2022-01-13 NOTE — Progress Notes (Signed)
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 UNIT/ML Sopn  Toujeo Max SoloStar 300 UNIT/ML Sopn           Medications marked "taking" at this time  Outpatient Medications Marked as Taking for the 01/13/22 encounter (Office Visit) with CMadelin Rear MD   Medication Sig Dispense Refill    butalbital-APAP-caffeine (FIORICET) 50-300-40 MG CAPS per capsule Take 1 capsule by mouth every 4 hours as needed for Headaches 20 capsule 0    Continuous Blood Gluc Sensor (FREESTYLE LIBRE 3 SENSOR) MISC Apply per package instructions to monitor blood glucose 2 each 11    Insulin Glargine, 2 Unit Dial, (TOUJEO MAX SOLOSTAR) 300 UNIT/ML SOPN Inject 160 Units into the skin at bedtime 14 Adjustable Dose Pre-filled Pen Syringe 5    insulin lispro, 1 Unit Dial, (HUMALOG KWIKPEN) 100 UNIT/ML SOPN Inject 6 Units into the skin 3 times daily (before meals) 5 Adjustable Dose Pre-filled Pen Syringe 1    metFORMIN (GLUCOPHAGE-XR) 500 MG extended release tablet Take 4 tablets by mouth Daily with supper 360 tablet 0    dicyclomine (BENTYL) 10 MG capsule Take 2 capsules by mouth 3 times daily as needed (abdominal cramping) 90 capsule 0    valACYclovir (VALTREX) 500 MG tablet Take 1 tablet by mouth 2 times daily 180  tablet 1    dicyclomine (BENTYL) 20 MG tablet Take 1 tablet by mouth every 6 hours as needed (ab cramping) 360 tablet 1    lisinopril (PRINIVIL;ZESTRIL) 20 MG tablet Take 1 tablet by mouth daily 90 tablet 1    metoprolol succinate (TOPROL XL) 25 MG extended release tablet Take 1 tablet by mouth nightly 90 tablet 1    rosuvastatin (CRESTOR) 20 MG tablet Take 1 tablet by mouth nightly 90 tablet 0    ondansetron (ZOFRAN-ODT) 4 MG disintegrating tablet Take 1 tablet by mouth 3 times daily as needed for Nausea or Vomiting 21 tablet 0    omeprazole (PRILOSEC) 40 MG delayed release capsule       Continuous Blood Gluc Sensor (FREESTYLE LIBRE 2 SENSOR) MISC Check blood sugar 1-4 times daily while titrating insulin and follow physician instructions 6 each 1        Medications patient taking as of now reconciled against medications ordered  at time of hospital discharge: Yes        Objective:    BP 134/88   Pulse 88   Temp 98.3 F (36.8 C) (Temporal)   Resp 20   Ht '5\' 3"'$  (1.6 m)   Wt 267 lb (121.1 kg)   SpO2 97%   BMI 47.30 kg/m   See above      An electronic signature was used to authenticate this note.  --Madelin Rear, MD

## 2022-01-17 NOTE — Progress Notes (Signed)
Patient called tonight with a day of persistent intermittent stomach discomfort /spamming every couple minutes that is reminiscent of her gastroparesis flare ups. She has taken 3 out of 4 of her dicyclomine dosages for the day, and wanted to know what else she could do? The medicine does help some of symptoms. She took ER last dosage at 4pm. Patient denies symptoms consistent with SBO and notes it is different than her prior experience  with obstruction. She denies red flag symptoms for gallstones, infection, incarceration of bowel. She is only eating small meals and is unsure of what flared her symptoms.     Recommended trialing another dose of dicyclomine and Zofran, and if not improving present to ER for further evaluation.

## 2022-01-18 NOTE — Telephone Encounter (Signed)
See other note

## 2022-01-25 NOTE — Telephone Encounter (Signed)
Pt called regarding prior auths needed for Fiorcet and Toujeo due to changing insurance from [place] [employer] to [facility] and is willing to do generic or alternative for Fiorcet for her migraines.    Pt also requesting call back from nurse to advise if ok to take pneumonia shot since she had severe case of pneumonia 2 months ago.    Please advise at [phone]. Ldm

## 2022-01-28 ENCOUNTER — Ambulatory Visit: Payer: PRIVATE HEALTH INSURANCE | Primary: Family Medicine

## 2022-01-28 NOTE — Telephone Encounter (Signed)
Fiorect PA has been submitted:   Taila Armas (Key: [id number])  Fioricet 50-300-'40MG'$  capsules  Status: Sent To PlanCreated: October 13th, 2023Sent: October 13th, 2023      [facility] PA has been submitted  Simrah [place] (Key: [id number])  Nelva Nay Max SoloStar 300UNIT/ML pen-injectors  Status: Sent To PlanCreated: October 13th, 2023Sent: October 13th, 2023

## 2022-02-04 ENCOUNTER — Ambulatory Visit: Payer: PRIVATE HEALTH INSURANCE | Primary: Family Medicine

## 2022-02-08 NOTE — Telephone Encounter (Signed)
error 

## 2022-02-18 ENCOUNTER — Ambulatory Visit: Payer: PRIVATE HEALTH INSURANCE | Primary: Family Medicine

## 2022-02-28 NOTE — Telephone Encounter (Signed)
Please advise on continued safe use of metformin

## 2022-02-28 NOTE — Telephone Encounter (Signed)
From: [name]  To: Dr. Madelin Rear  Sent: 02/28/2022 2:32 PM EST  Subject: Metformin     Is it safe for me to still take metformin. I read that it has been recalled for having cancer causing properties.

## 2022-03-04 ENCOUNTER — Ambulatory Visit
Admit: 2022-03-04 | Discharge: 2022-03-04 | Payer: PRIVATE HEALTH INSURANCE | Attending: Family Medicine | Primary: Family Medicine

## 2022-03-04 ENCOUNTER — Ambulatory Visit: Payer: PRIVATE HEALTH INSURANCE | Attending: Family Medicine | Primary: Family Medicine

## 2022-03-04 ENCOUNTER — Ambulatory Visit: Payer: PRIVATE HEALTH INSURANCE | Primary: Family Medicine

## 2022-03-04 DIAGNOSIS — I10 Essential (primary) hypertension: Secondary | ICD-10-CM
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
## 2022-03-04 NOTE — Patient Instructions (Signed)
FROM OUR LAST VISIT  Patient Instructions   Please continue to see diabetic ed  Continue metformin  ALREADY DONE: Stop trulicity, will not use GLP given recurrent abdominal symptoms requiring hospitalization  Moved her Toujeo up to 160, not having hypoglycemia, will continue  Use freestyle, but check sugar when not able to use freestyle with a finger stick    Start adding mealtime insulin  Going to learn carb counting with education department and may also for now at least use correction scale, see instructions below     Going to change insurance--will refer to endo when new insurance is active--DONE TODAY     This fall recommend covid and flu vaccines be updated--already got flu shot, getting covid shot soon     FOR MIGRAINES: ok to occasionally use excedrin having less than 9 headaches per month     Insulin Instructions   Instructions for Mealtime Novolog  (or Humalog) Insulin     This is the rapid-acting insulin, used at mealtime to prevent a high BG after you eat.  Check your BG before each meal.    If your BG is 100 or higher, inject Novolog right before eating.     If your BG is 80 - 100, inject Novolog right after eating.  If your BG is lower than 80, or you have symptoms of a low BG, treat the low BG first, then eat your meal and take the Novolog after eating.      The dose of Novolog is based on the amount of carbohydrate in your meal.  Take 1 unit for every 5 grams of carbohydrate:                 Total carb in meal       5 grams = Novolog 1 unit                                                  10 grams = Novolog 2 units                                                  15 grams = Novolog 3 units                                                  20 grams = Novolog 4 units                                                  25 grams = Novolog 5 units                                                  30 grams = Novolog 6 units  35 grams = Novolog 7 units                                                   40 grams = Novolog 8 units     If you should forget to take your Novolog with a meal, take the usual dose if less than an hour has passed since you ate your meal.  If more than an hour has passed since you ate, check your BG and use the correction scale below.                  Instructions for Correction Novolog Insulin     This is the rapid-acting insulin, used to correct a high BG, and bring it back down to the target.  Inject correction Novolog if your BG is higher than 150 before meals or at bedtime.  This dose can be added to the mealtime dose and given all as one injection.  At bedtime, take this dose by itself.     ADD the following extra insulin if your before-meal BG is higher than 150:  BG 150-170 take 1 extra unit  BG 171-190 take 2 extra units  BG 191-210 take 3 extra units   BG 211-230 take 4 extra units   BG 231-250 take 5 extra units  BG 251-270 take 6 extra units   BG 271-290 take 7 extra units   BG 291-310 take 8 extra units  BG 311-330 take 9 extra units AND SO ON AND SO FORTH...        If you check your BG three - four hours after a meal - it should be in the target of 80 - 150.  If the BG is higher than 150, take another dose of Novolog according to the scale above.  You may repeat this every 3 - 4 hours until your BG is less than 150.  Do not take Novolog doses closer than 3 - 4 hours apart.  Call your doctor if your BG does not come down after three extra doses.        Treatment of Low Blood Sugar (Hypoglycemia)     If your BG is lower than 80, you are likely to feel shaky, sweaty and lightheaded.  This is a signal that your body needs more sugar.  Quickly eat or drink a small serving of something sweet, such as:              4 ounces fruit juice or regular (not diet) soda              6 lifesavers              small box of raisins              4 glucose tablets  (~15 gm of glucose)     NB: If your BG is very low <50, you can double the amount above or take  30 gm of glucose gel/tablets.     Sit and rest and you should feel better within a few minutes.  Once you are feeling better, try to determine why your BG was so low.  Common causes of hypoglycemia include skipping a meal, lots of exercise, too much insulin or any combination of these things.  Understanding the cause my help you to avoid  another low BG in the future.

## 2022-03-04 NOTE — Progress Notes (Signed)
Chief Complaint   Patient presents with    Follow-up     1. Have you been to the ER, urgent care clinic since your last visit?  Hospitalized since your last visit?No    2. Have you seen or consulted any other health care providers outside of the [facility] since your last visit?  Include any pap smears or colon screening. No

## 2022-03-09 NOTE — Telephone Encounter (Signed)
[id number]    METFORMAIN HCL  ER 500 MG TABLET    Qty 360    Take 4 tablets daily by mouth with supper

## 2022-03-11 NOTE — Addendum Note (Signed)
Addended by: Marshia Ly on: 03/11/2022 02:12 PM     Modules accepted: Orders

## 2022-03-14 MED ORDER — METFORMIN HCL ER 500 MG PO TB24
500 MG | ORAL_TABLET | Freq: Every day | ORAL | 0 refills | Status: DC
Start: 2022-03-14 — End: 2022-04-25

## 2022-03-18 ENCOUNTER — Ambulatory Visit: Payer: PRIVATE HEALTH INSURANCE | Primary: Family Medicine

## 2022-03-29 ENCOUNTER — Encounter

## 2022-03-29 MED ORDER — ROSUVASTATIN CALCIUM 20 MG PO TABS
20 MG | ORAL_TABLET | ORAL | 0 refills | Status: DC
Start: 2022-03-29 — End: 2022-05-23

## 2022-04-19 ENCOUNTER — Ambulatory Visit: Payer: MEDICAID | Attending: Obstetrics & Gynecology | Primary: Family Medicine

## 2022-04-25 MED ORDER — METFORMIN HCL ER 500 MG PO TB24
500 MG | ORAL_TABLET | Freq: Every day | ORAL | 3 refills | Status: DC
Start: 2022-04-25 — End: 2023-06-01

## 2022-04-25 NOTE — Telephone Encounter (Signed)
[facility] day refill request for Metformin HCL ER 500 mg tablets Sig: Take 4 tablets daily with supper, dispense quantity 360 tablets. Ldm

## 2022-05-05 ENCOUNTER — Ambulatory Visit: Admit: 2022-05-05 | Payer: MEDICAID | Primary: Family Medicine

## 2022-05-05 ENCOUNTER — Ambulatory Visit: Admit: 2022-05-05 | Discharge: 2022-05-05 | Payer: MEDICAID | Primary: Family Medicine

## 2022-05-05 ENCOUNTER — Encounter

## 2022-05-05 ENCOUNTER — Ambulatory Visit
Admit: 2022-05-05 | Discharge: 2022-05-05 | Payer: MEDICAID | Attending: Obstetrics & Gynecology | Primary: Family Medicine

## 2022-05-05 DIAGNOSIS — Z124 Encounter for screening for malignant neoplasm of cervix: Secondary | ICD-10-CM

## 2022-05-05 DIAGNOSIS — Z1231 Encounter for screening mammogram for malignant neoplasm of breast: Secondary | ICD-10-CM

## 2022-05-05 DIAGNOSIS — Z01419 Encounter for gynecological examination (general) (routine) without abnormal findings: Secondary | ICD-10-CM
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
## 2022-05-05 NOTE — Progress Notes (Signed)
Alyssa [name] is a 42 y.o. female returns for an annual exam     No chief complaint on file.      No LMP recorded. Patient has had a hysterectomy.  Her periods are absent in flow   Problems: no problems  Birth Control: status post hysterectomy.  Last Pap: normal obtained 3 year(s) ago.  She does not have a history of CIN 2, 3 or cervical cancer.   Last Mammogram: had her mammogram today in our office.      Last colonoscopy: normal obtained 1 year(s) ago.      1. Have you been to the ER, urgent care clinic, or hospitalized since your last visit? No    2. Have you seen or consulted any other health care providers outside of the [facility] since your last visit? No    Examination chaperoned by Gwenlyn Perking, LPN.

## 2022-05-06 ENCOUNTER — Inpatient Hospital Stay: Payer: MEDICAID | Attending: Family Medicine | Primary: Family Medicine

## 2022-05-06 ENCOUNTER — Ambulatory Visit: Admit: 2022-05-06 | Discharge: 2022-05-06 | Payer: MEDICAID | Attending: Family Medicine | Primary: Family Medicine

## 2022-05-06 ENCOUNTER — Ambulatory Visit: Payer: MEDICAID | Attending: Family Medicine | Primary: Family Medicine

## 2022-05-06 DIAGNOSIS — G8929 Other chronic pain: Secondary | ICD-10-CM

## 2022-05-06 MED ORDER — INSULIN LISPRO (1 UNIT DIAL) 100 UNIT/ML SC SOPN
100 UNIT/ML | Freq: Three times a day (TID) | SUBCUTANEOUS | 4 refills | Status: AC
Start: 2022-05-06 — End: 2022-09-09

## 2022-05-06 NOTE — Patient Instructions (Signed)
[email or web address]

## 2022-05-06 NOTE — Assessment & Plan Note (Signed)
Unclear control, consider therapist, discuss at next visit.

## 2022-05-06 NOTE — Assessment & Plan Note (Addendum)
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 However, the skin and muscle nodular lesions are interesting, and given lack of conservative improvement, more than reasonable to get bony and soft tissue imaging prior to going to PT to look for neuroma, muscle lesion, bony lesion pressing on nerve. Will also give home exercises.
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
## 2022-05-06 NOTE — Progress Notes (Signed)
Chief Complaint   Patient presents with    Skin Problem     Smaller than a pea sized mobile nodule noted +TTP no s/s infection noted.

## 2022-05-10 LAB — PAP IG, APTIMA HPV AND RFX 16/18,45 (199305)
.: 0
HPV Aptima: NEGATIVE

## 2022-05-13 ENCOUNTER — Encounter

## 2022-05-13 ENCOUNTER — Inpatient Hospital Stay: Admit: 2022-05-13 | Payer: MEDICAID | Primary: Family Medicine

## 2022-05-13 DIAGNOSIS — M25522 Pain in left elbow: Secondary | ICD-10-CM

## 2022-05-13 DIAGNOSIS — G8929 Other chronic pain: Secondary | ICD-10-CM

## 2022-05-23 ENCOUNTER — Encounter

## 2022-05-24 MED ORDER — VALACYCLOVIR HCL 500 MG PO TABS
500 MG | ORAL_TABLET | Freq: Two times a day (BID) | ORAL | 1 refills | Status: DC
Start: 2022-05-24 — End: 2023-06-08

## 2022-05-24 MED ORDER — ROSUVASTATIN CALCIUM 20 MG PO TABS
20 | ORAL_TABLET | ORAL | 3 refills | Status: DC
Start: 2022-05-24 — End: 2022-08-22

## 2022-05-26 ENCOUNTER — Inpatient Hospital Stay
Admit: 2022-05-26 | Discharge: 2022-05-27 | Disposition: A | Discharge status: Home / Self Care | Payer: MEDICAID | Attending: Student in an Organized Health Care Education/Training Program

## 2022-05-26 ENCOUNTER — Emergency Department: Admit: 2022-05-27 | Payer: MEDICAID | Primary: Family Medicine

## 2022-05-26 ENCOUNTER — Emergency Department: Admit: 2022-05-26 | Payer: MEDICAID | Primary: Family Medicine

## 2022-05-26 DIAGNOSIS — R079 Chest pain, unspecified: Principal | ICD-10-CM

## 2022-05-26 LAB — CBC WITH AUTO DIFFERENTIAL
Absolute Immature Granulocyte: 0 10*3/uL (ref 0.00–0.04)
Basophils %: 1 % (ref 0–1)
Basophils Absolute: 0.1 10*3/uL (ref 0.0–0.1)
Eosinophils %: 1 % (ref 0–7)
Eosinophils Absolute: 0.1 10*3/uL (ref 0.0–0.4)
Hematocrit: 38.7 % (ref 35.0–47.0)
Hemoglobin: 12.6 g/dL (ref 11.5–16.0)
Immature Granulocytes: 0 % (ref 0–0.5)
Lymphocytes %: 40 % (ref 12–49)
Lymphocytes Absolute: 3.9 10*3/uL — ABNORMAL HIGH (ref 0.8–3.5)
MCH: 25.8 PG — ABNORMAL LOW (ref 26.0–34.0)
MCHC: 32.6 g/dL (ref 30.0–36.5)
MCV: 79.3 FL — ABNORMAL LOW (ref 80.0–99.0)
MPV: 12.3 FL (ref 8.9–12.9)
Monocytes %: 6 % (ref 5–13)
Monocytes Absolute: 0.6 10*3/uL (ref 0.0–1.0)
Neutrophils %: 52 % (ref 32–75)
Neutrophils Absolute: 5.2 10*3/uL (ref 1.8–8.0)
Nucleated RBCs: 0 PER 100 WBC
Platelets: 384 10*3/uL (ref 150–400)
RBC: 4.88 M/uL (ref 3.80–5.20)
RDW: 14.9 % — ABNORMAL HIGH (ref 11.5–14.5)
WBC: 9.9 10*3/uL (ref 3.6–11.0)
nRBC: 0 10*3/uL (ref 0.00–0.01)

## 2022-05-26 MED ORDER — LIDOCAINE VISCOUS HCL 2 % MT SOLN
2 | Freq: Once | OROMUCOSAL | Status: AC
Start: 2022-05-26 — End: 2022-05-26
  Administered 2022-05-27: 01:00:00 40 mL via ORAL

## 2022-05-26 NOTE — ED Triage Notes (Signed)
Pt reports chest pain/epigastric pain that radiates to left shoulder and back.
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
## 2022-05-26 NOTE — ED Notes (Signed)
Discharge instructions given to patient; denies questions.

## 2022-05-27 LAB — COMPREHENSIVE METABOLIC PANEL
ALT: 16 U/L (ref 12–78)
AST: 11 U/L — ABNORMAL LOW (ref 15–37)
Albumin/Globulin Ratio: 0.8 — ABNORMAL LOW (ref 1.1–2.2)
Albumin: 3.5 g/dL (ref 3.5–5.0)
Alk Phosphatase: 78 U/L (ref 45–117)
Anion Gap: 11 mmol/L (ref 5–15)
BUN: 8 MG/DL (ref 6–20)
Bun/Cre Ratio: 10 — ABNORMAL LOW (ref 12–20)
CO2: 24 mmol/L (ref 21–32)
Calcium: 9.2 MG/DL (ref 8.5–10.1)
Chloride: 99 mmol/L (ref 97–108)
Creatinine: 0.77 MG/DL (ref 0.55–1.02)
Est, Glom Filt Rate: >60 mL/min/{1.73_m2} (ref >60)
Globulin: 4.5 g/dL — ABNORMAL HIGH (ref 2.0–4.0)
Glucose: 348 mg/dL — ABNORMAL HIGH (ref 65–100)
Potassium: 3.9 mmol/L (ref 3.5–5.1)
Sodium: 134 mmol/L — ABNORMAL LOW (ref 136–145)
Total Bilirubin: 0.6 MG/DL (ref 0.2–1.0)
Total Protein: 8 g/dL (ref 6.4–8.2)

## 2022-05-27 LAB — EKG 12-LEAD
Atrial Rate: 98 {beats}/min
Diagnosis: NORMAL
P Axis: 28 degrees
P-R Interval: 154 ms
Q-T Interval: 356 ms
QRS Duration: 82 ms
QTc Calculation (Bazett): 454 ms
R Axis: 88 degrees
T Axis: 30 degrees
Ventricular Rate: 98 {beats}/min

## 2022-05-27 LAB — LIPASE: Lipase: 51 U/L (ref 13–75)

## 2022-05-27 LAB — TROPONIN: Troponin, High Sensitivity: 7 ng/L (ref 0–51)

## 2022-05-27 MED ORDER — KETOROLAC TROMETHAMINE 30 MG/ML IJ SOLN
30 | INTRAMUSCULAR | Status: AC
Start: 2022-05-27 — End: 2022-05-26
  Administered 2022-05-27: 01:00:00 30 mg via INTRAVENOUS

## 2022-05-27 MED ORDER — SODIUM CHLORIDE (PF) 0.9 % IJ SOLN
0.9 | Freq: Once | INTRAMUSCULAR | Status: AC
Start: 2022-05-27 — End: 2022-05-26
  Administered 2022-05-27: 01:00:00 20 mg via INTRAVENOUS

## 2022-05-27 MED FILL — FAMOTIDINE (PF) 20 MG/2ML IV SOLN: 20 MG/2ML | INTRAVENOUS | Qty: 2

## 2022-05-27 MED FILL — KETOROLAC TROMETHAMINE 30 MG/ML IJ SOLN: 30 MG/ML | INTRAMUSCULAR | Qty: 1

## 2022-05-27 MED FILL — MAG-AL PLUS 200-200-20 MG/5ML PO LIQD: 200-200-20 MG/5ML | ORAL | Qty: 30

## 2022-06-01 ENCOUNTER — Emergency Department: Admit: 2022-06-01 | Payer: MEDICAID | Primary: Family Medicine

## 2022-06-01 ENCOUNTER — Inpatient Hospital Stay
Admit: 2022-06-01 | Discharge: 2022-06-01 | Disposition: A | Discharge status: Home / Self Care | Payer: MEDICAID | Attending: Student in an Organized Health Care Education/Training Program

## 2022-06-01 DIAGNOSIS — U071 COVID-19: Secondary | ICD-10-CM

## 2022-06-01 LAB — COVID-19, RAPID: SARS-CoV-2, Rapid: DETECTED — AB

## 2022-06-01 LAB — RAPID INFLUENZA A/B ANTIGENS
Influenza A Ag: NEGATIVE
Influenza B Ag: NEGATIVE

## 2022-06-01 LAB — RAPID STREP SCREEN: Strep A Ag: NEGATIVE

## 2022-06-01 MED ORDER — ONDANSETRON 4 MG PO TBDP
4 MG | ORAL_TABLET | Freq: Three times a day (TID) | ORAL | 0 refills | Status: AC | PRN
Start: 2022-06-01 — End: 2023-03-08

## 2022-06-01 MED ORDER — ACETAMINOPHEN 500 MG PO TABS
500 MG | ORAL_TABLET | Freq: Four times a day (QID) | ORAL | 0 refills | Status: DC | PRN
Start: 2022-06-01 — End: 2022-09-02

## 2022-06-01 MED ORDER — ACETAMINOPHEN 500 MG PO TABS
500 | Freq: Once | ORAL | Status: AC
Start: 2022-06-01 — End: 2022-06-01
  Administered 2022-06-01: 13:00:00 1000 mg via ORAL

## 2022-06-01 MED ORDER — ONDANSETRON 4 MG PO TBDP
4 | ORAL | Status: AC
Start: 2022-06-01 — End: 2022-06-01
  Administered 2022-06-01: 13:00:00 4 mg via ORAL

## 2022-06-01 MED ORDER — IBUPROFEN 600 MG PO TABS
600 MG | ORAL_TABLET | Freq: Four times a day (QID) | ORAL | 0 refills | Status: DC | PRN
Start: 2022-06-01 — End: 2022-09-02

## 2022-06-01 MED ORDER — NIRMATRELVIR&RITONAVIR 300/100 20 X 150 MG & 10 X 100MG PO TBPK
20 | ORAL_TABLET | ORAL | 0 refills | Status: AC
Start: 2022-06-01 — End: 2022-06-06

## 2022-06-01 MED FILL — ONDANSETRON 4 MG PO TBDP: 4 MG | ORAL | Qty: 1

## 2022-06-01 MED FILL — ACETAMINOPHEN EXTRA STRENGTH 500 MG PO TABS: 500 MG | ORAL | Qty: 2
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
## 2022-06-01 NOTE — ED Triage Notes (Signed)
Pt ambulated to the treatment area with a steady gait . Pt states '"this morning I started with a sore throat and cough and I have lost my voice no fevers my stomach feels uncomfortable intermittently. I have had a recent exposure to Covid at work."

## 2022-06-01 NOTE — Telephone Encounter (Signed)
ON CALL NOTE:  Patient called after-hours.  She was diagnosed with COVID infection today in the ER.  She has started on Paxlovid.  Complaining of a lot of congestion, coughing, and fever.  Patient is taking ibuprofen and her temperature now is normal.    Documentation:  I communicated with the patient and/or health care decision maker about manage COVID 19 infection at home.  Recommend self quarantine.  Push oral fluids.  Symptomatic treatment using ibuprofen or acetaminophen for headache and body aches. May take Delsym for cough.  Use sinus rinse as needed.   Call MD if develop respiratory distress or chest pain.

## 2022-06-02 LAB — CULTURE, THROAT: Culture: NORMAL

## 2022-06-03 ENCOUNTER — Ambulatory Visit: Payer: MEDICAID | Primary: Family Medicine

## 2022-06-03 ENCOUNTER — Inpatient Hospital Stay: Payer: MEDICAID | Primary: Family Medicine

## 2022-06-30 NOTE — Telephone Encounter (Signed)
From: [name]  To: Dr. Madelin Rear  Sent: 06/29/2022 5:28 PM EDT  Subject: These things look like grapes....what are they?    Please tell me what these are. I continue to have pain pain and changes in bowel texture and bowel frequency. Inconsistent bouts of nausea. Happens if I eat solid or if I drink liquids. Thought it was just gastroparesis symptoms and that this was my new normal. This, though, is now the 7th or 8th time I've seen these in the toilet. It is not painful to move my bowels but I did have polyps removed before so am highly concerned. I know I see you on the 19th but I thought this was important for you to know and if you can ease my concern/fear by letting me know what you think these grape looking things are I would be very thankful.

## 2022-06-30 NOTE — Telephone Encounter (Signed)
Please see attached and provide your medical opinion

## 2022-07-05 ENCOUNTER — Encounter

## 2022-07-05 ENCOUNTER — Ambulatory Visit: Admit: 2022-07-05 | Discharge: 2022-07-05 | Payer: MEDICAID | Attending: Family Medicine | Primary: Family Medicine

## 2022-07-05 ENCOUNTER — Inpatient Hospital Stay: Admit: 2022-07-05 | Payer: MEDICAID | Primary: Family Medicine

## 2022-07-05 DIAGNOSIS — M25522 Pain in left elbow: Secondary | ICD-10-CM

## 2022-07-05 DIAGNOSIS — E11649 Type 2 diabetes mellitus with hypoglycemia without coma: Secondary | ICD-10-CM

## 2022-07-05 MED ORDER — LANCETS MISC
5 refills | Status: DC
Start: 2022-07-05 — End: 2022-09-02

## 2022-07-05 MED ORDER — BLOOD GLUCOSE MONITORING SUPPL DEVI
PACK | 0 refills | Status: DC
Start: 2022-07-05 — End: 2022-09-02

## 2022-07-05 MED ORDER — BLOOD GLUCOSE TEST VI STRP
ORAL_STRIP | 3 refills | Status: DC
Start: 2022-07-05 — End: 2022-09-02
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
## 2022-07-05 NOTE — Progress Notes (Signed)
Alyssa [name] is a 42 y.o. female presenting for Annual Exam (Physical )      BP (!) 142/91 (Site: Right Upper Arm, Position: Sitting, Cuff Size: Large Adult)   Pulse (!) 101   Temp 99 F (37.2 C) (Oral)   Resp 18   Ht 1.6 m (5\' 3" )   Wt 115.2 kg (254 lb)   SpO2 97%   BMI 44.99 kg/m       Current Outpatient Medications   Medication Sig Dispense Refill    ondansetron (ZOFRAN-ODT) 4 MG disintegrating tablet Take 1 tablet by mouth 3 times daily as needed for Nausea or Vomiting 10 tablet 0    valACYclovir (VALTREX) 500 MG tablet Take 1 tablet by mouth 2 times daily 180 tablet 1    rosuvastatin (CRESTOR) 20 MG tablet TAKE 1 TABLET BY MOUTH EVERY DAY AT NIGHT *NEW INSURANCE?* 90 tablet 3    insulin lispro, 1 Unit Dial, (HUMALOG KWIKPEN) 100 UNIT/ML SOPN Inject 6 Units into the skin 3 times daily (before meals) 5 Adjustable Dose Pre-filled Pen Syringe 4    metFORMIN (GLUCOPHAGE-XR) 500 MG extended release tablet Take 4 tablets by mouth Daily with supper (Patient taking differently: Take 2 tablets by mouth in the morning and at bedtime) 360 tablet 3    RESTASIS 0.05 % ophthalmic emulsion Place 1 drop into both eyes in the morning and at bedtime      Continuous Blood Gluc Sensor (FREESTYLE LIBRE 3 SENSOR) MISC Apply per package instructions to monitor blood glucose 2 each 11    Insulin Glargine, 2 Unit Dial, (TOUJEO MAX SOLOSTAR) 300 UNIT/ML SOPN Inject 160 Units into the skin at bedtime 14 Adjustable Dose Pre-filled Pen Syringe 5    dicyclomine (BENTYL) 20 MG tablet Take 1 tablet by mouth every 6 hours as needed (ab cramping) 360 tablet 1    lisinopril (PRINIVIL;ZESTRIL) 20 MG tablet Take 1 tablet by mouth daily 90 tablet 1    metoprolol succinate (TOPROL XL) 25 MG extended release tablet Take 1 tablet by mouth nightly 90 tablet 1    omeprazole (PRILOSEC) 40 MG delayed release capsule       Continuous Blood Gluc Sensor (FREESTYLE LIBRE 2 SENSOR) MISC Check blood sugar 1-4 times daily while titrating insulin and  follow physician instructions 6 each 1    acetaminophen (TYLENOL) 500 MG tablet Take 2 tablets by mouth every 6 hours as needed for Pain or Fever 56 tablet 0    ibuprofen (ADVIL;MOTRIN) 600 MG tablet Take 1 tablet by mouth every 6 hours as needed for Pain or Fever 28 tablet 0    dicyclomine (BENTYL) 10 MG capsule Take 2 capsules by mouth 3 times daily as needed (abdominal cramping) (Patient not taking: Reported on 05/26/2022) 90 capsule 0     No current facility-administered medications for this visit.        1. "Have you been to the ER, urgent care clinic since your last visit?  Hospitalized since your last visit?" No    2. "Have you seen or consulted any other health care providers outside of the [facility] since your last visit?" No     3. For patients aged 51-75: Has the patient had a colonoscopy / FIT/ Cologuard? NA - based on age      If the patient is female:    4. For patients aged 43-74: Has the patient had a mammogram within the past 2 years? Yes - Care Gap present. Most recent result  on file      5. For patients aged 21-65: Has the patient had a pap smear? Yes - Care Gap present. Most recent result on file

## 2022-07-05 NOTE — Patient Instructions (Signed)
Worried about diabetic control  Discussed with patient compliance with insulin and considered her concerns about risk of insulin  It is important that the risk of HIGH SUGAR far outweighs the risk of properly taking the meds we have prescribed  I do think there is benefit here in an endo consult, reprinted    Labs, not fasting, per my orders    Glucometer sent, having trouble with libre system, temporarily going back to finger sticks for now    No med changes as we await labs    Fu with new PCP, Dr Patsey Berthold for care    Grapes in stool are in fact "ghost metformin" tablets that have dissolved and expanded in digestion

## 2022-07-06 LAB — COMPREHENSIVE METABOLIC PANEL
ALT: 17 U/L (ref 12–78)
AST: 8 U/L — ABNORMAL LOW (ref 15–37)
Albumin/Globulin Ratio: 0.9 — ABNORMAL LOW (ref 1.1–2.2)
Albumin: 3.7 g/dL (ref 3.5–5.0)
Alk Phosphatase: 86 U/L (ref 45–117)
Anion Gap: 11 mmol/L (ref 5–15)
BUN/Creatinine Ratio: 12 (ref 12–20)
BUN: 10 MG/DL (ref 6–20)
CO2: 24 mmol/L (ref 21–32)
Calcium: 9.8 MG/DL (ref 8.5–10.1)
Chloride: 99 mmol/L (ref 97–108)
Creatinine: 0.82 MG/DL (ref 0.55–1.02)
Est, Glom Filt Rate: >60 mL/min/{1.73_m2} (ref >60)
Globulin: 4.2 g/dL — ABNORMAL HIGH (ref 2.0–4.0)
Glucose: 405 mg/dL — ABNORMAL HIGH (ref 65–100)
Potassium: 4.3 mmol/L (ref 3.5–5.1)
Sodium: 134 mmol/L — ABNORMAL LOW (ref 136–145)
Total Bilirubin: 0.5 MG/DL (ref 0.2–1.0)
Total Protein: 7.9 g/dL (ref 6.4–8.2)

## 2022-07-06 LAB — CBC
Hematocrit: 38 % (ref 35.0–47.0)
Hemoglobin: 12.3 g/dL (ref 11.5–16.0)
MCH: 25.9 PG — ABNORMAL LOW (ref 26.0–34.0)
MCHC: 32.4 g/dL (ref 30.0–36.5)
MCV: 80 FL (ref 80.0–99.0)
MPV: 11.8 FL (ref 8.9–12.9)
Nucleated RBCs: 0 PER 100 WBC
Platelets: 399 10*3/uL (ref 150–400)
RBC: 4.75 M/uL (ref 3.80–5.20)
RDW: 15.5 % — ABNORMAL HIGH (ref 11.5–14.5)
WBC: 11.1 10*3/uL — ABNORMAL HIGH (ref 3.6–11.0)
nRBC: 0 10*3/uL (ref 0.00–0.01)

## 2022-07-06 LAB — MICROALBUMIN / CREATININE URINE RATIO
Creatinine, Ur: 38.9 mg/dL
Microalb, Ur: 1.2 mg/dL
Microalb/Creat Ratio: 31 mg/g — ABNORMAL HIGH (ref 0–30)

## 2022-07-06 LAB — HEMOGLOBIN A1C
Estimated Avg Glucose: 352 mg/dL
Hemoglobin A1C: 13.9 % — ABNORMAL HIGH (ref 4.0–5.6)

## 2022-07-07 LAB — THYROID CASCADE PROFILE: TSH: 0.968 u[IU]/mL (ref 0.450–4.500)

## 2022-07-08 ENCOUNTER — Inpatient Hospital Stay
Admit: 2022-07-08 | Discharge: 2022-07-08 | Disposition: A | Discharge status: Home / Self Care | Payer: MEDICAID | Attending: Emergency Medicine

## 2022-07-08 ENCOUNTER — Emergency Department: Admit: 2022-07-08 | Payer: MEDICAID | Primary: Family Medicine

## 2022-07-08 DIAGNOSIS — E1165 Type 2 diabetes mellitus with hyperglycemia: Principal | ICD-10-CM

## 2022-07-08 DIAGNOSIS — R1013 Epigastric pain: Secondary | ICD-10-CM

## 2022-07-08 LAB — COMPREHENSIVE METABOLIC PANEL
ALT: 18 U/L (ref 12–78)
AST: 10 U/L — ABNORMAL LOW (ref 15–37)
Albumin/Globulin Ratio: 0.8 — ABNORMAL LOW (ref 1.1–2.2)
Albumin: 3.4 g/dL — ABNORMAL LOW (ref 3.5–5.0)
Alk Phosphatase: 80 U/L (ref 45–117)
Anion Gap: 10 mmol/L (ref 5–15)
BUN: 12 MG/DL (ref 6–20)
Bun/Cre Ratio: 16 (ref 12–20)
CO2: 27 mmol/L (ref 21–32)
Calcium: 9.1 MG/DL (ref 8.5–10.1)
Chloride: 97 mmol/L (ref 97–108)
Creatinine: 0.77 MG/DL (ref 0.55–1.02)
Est, Glom Filt Rate: >60 mL/min/{1.73_m2} (ref >60)
Globulin: 4.4 g/dL — ABNORMAL HIGH (ref 2.0–4.0)
Glucose: 338 mg/dL — ABNORMAL HIGH (ref 65–100)
Potassium: 3.8 mmol/L (ref 3.5–5.1)
Sodium: 134 mmol/L — ABNORMAL LOW (ref 136–145)
Total Bilirubin: 0.5 MG/DL (ref 0.2–1.0)
Total Protein: 7.8 g/dL (ref 6.4–8.2)

## 2022-07-08 LAB — URINALYSIS WITH REFLEX TO CULTURE
BACTERIA, URINE: NEGATIVE /hpf
Bilirubin Urine: NEGATIVE
Blood, Urine: NEGATIVE
Glucose, UA: >1000 mg/dL — AB
Ketones, Urine: NEGATIVE mg/dL
Leukocyte Esterase, Urine: NEGATIVE
Nitrite, Urine: NEGATIVE
Protein, UA: 30 mg/dL — AB
Specific Gravity, UA: 1.015 (ref 1.003–1.030)
Urobilinogen, Urine: 0.2 EU/dL (ref 0.2–1.0)
pH, Urine: 7 (ref 5.0–8.0)

## 2022-07-08 LAB — CBC WITH AUTO DIFFERENTIAL
Absolute Immature Granulocyte: 0 10*3/uL (ref 0.00–0.04)
Basophils %: 1 % (ref 0–1)
Basophils Absolute: 0.1 10*3/uL (ref 0.0–0.1)
Eosinophils %: 2 % (ref 0–7)
Eosinophils Absolute: 0.2 10*3/uL (ref 0.0–0.4)
Hematocrit: 37.4 % (ref 35.0–47.0)
Hemoglobin: 12.3 g/dL (ref 11.5–16.0)
Immature Granulocytes: 0 % (ref 0–0.5)
Lymphocytes %: 41 % (ref 12–49)
Lymphocytes Absolute: 4.3 10*3/uL — ABNORMAL HIGH (ref 0.8–3.5)
MCH: 25.7 PG — ABNORMAL LOW (ref 26.0–34.0)
MCHC: 32.9 g/dL (ref 30.0–36.5)
MCV: 78.1 FL — ABNORMAL LOW (ref 80.0–99.0)
MPV: 11.7 FL (ref 8.9–12.9)
Monocytes %: 6 % (ref 5–13)
Monocytes Absolute: 0.6 10*3/uL (ref 0.0–1.0)
Neutrophils %: 50 % (ref 32–75)
Neutrophils Absolute: 5.3 10*3/uL (ref 1.8–8.0)
Nucleated RBCs: 0 PER 100 WBC
Platelets: 352 10*3/uL (ref 150–400)
RBC: 4.79 M/uL (ref 3.80–5.20)
RDW: 15.3 % — ABNORMAL HIGH (ref 11.5–14.5)
WBC: 10.4 10*3/uL (ref 3.6–11.0)
nRBC: 0 10*3/uL (ref 0.00–0.01)

## 2022-07-08 LAB — POCT GLUCOSE: POC Glucose: 322 mg/dL — ABNORMAL HIGH (ref 65–117)

## 2022-07-08 LAB — AMYLASE: Amylase: 34 U/L (ref 25–115)

## 2022-07-08 LAB — LIPASE: Lipase: 63 U/L (ref 13–75)

## 2022-07-08 LAB — MAGNESIUM: Magnesium: 1.6 mg/dL (ref 1.6–2.4)

## 2022-07-08 MED ORDER — DICYCLOMINE HCL 20 MG PO TABS
20 MG | ORAL_TABLET | Freq: Four times a day (QID) | ORAL | 0 refills | Status: DC
Start: 2022-07-08 — End: 2023-03-09

## 2022-07-08 MED ORDER — KETOROLAC TROMETHAMINE 30 MG/ML IJ SOLN
30 | INTRAMUSCULAR | Status: AC
Start: 2022-07-08 — End: 2022-07-08
  Administered 2022-07-08: 08:00:00 30 mg via INTRAVENOUS

## 2022-07-08 MED ORDER — FAMOTIDINE 20 MG PO TABS
20 | ORAL_TABLET | Freq: Two times a day (BID) | ORAL | 0 refills | Status: DC
Start: 2022-07-08 — End: 2024-02-19

## 2022-07-08 MED ORDER — SODIUM CHLORIDE 0.9 % IV BOLUS
0.9 | Freq: Once | INTRAVENOUS | Status: AC
Start: 2022-07-08 — End: 2022-07-08
  Administered 2022-07-08: 08:00:00 1000 mL via INTRAVENOUS

## 2022-07-08 MED ORDER — ONDANSETRON HCL 4 MG/2ML IJ SOLN
4 | Freq: Once | INTRAMUSCULAR | Status: AC
Start: 2022-07-08 — End: 2022-07-08
  Administered 2022-07-08: 08:00:00 8 mg via INTRAVENOUS

## 2022-07-08 MED ORDER — ONDANSETRON 4 MG PO TBDP
4 MG | ORAL_TABLET | Freq: Three times a day (TID) | ORAL | 0 refills | Status: DC | PRN
Start: 2022-07-08 — End: 2022-09-02

## 2022-07-08 MED ORDER — FENTANYL 0.05 MG/ML SOLN (MIXTURES ONLY)
Status: AC
Start: 2022-07-08 — End: 2022-07-08
  Administered 2022-07-08: 08:00:00 50 ug via INTRAVENOUS

## 2022-07-08 MED ORDER — INSULIN LISPRO 100 UNIT/ML IJ SOLN
100 | Freq: Once | INTRAMUSCULAR | Status: AC
Start: 2022-07-08 — End: 2022-07-08
  Administered 2022-07-08: 10:00:00 10 [IU] via SUBCUTANEOUS

## 2022-07-08 MED FILL — SODIUM CHLORIDE 0.9 % IV SOLN: 0.9 % | INTRAVENOUS | Qty: 1000

## 2022-07-08 MED FILL — KETOROLAC TROMETHAMINE 30 MG/ML IJ SOLN: 30 MG/ML | INTRAMUSCULAR | Qty: 1

## 2022-07-08 MED FILL — INSULIN LISPRO 100 UNIT/ML IJ SOLN: 100 UNIT/ML | INTRAMUSCULAR | Qty: 10

## 2022-07-08 MED FILL — FENTANYL CITRATE (PF) 100 MCG/2ML IJ SOLN: 100 MCG/2ML | INTRAMUSCULAR | Qty: 2

## 2022-07-08 MED FILL — ONDANSETRON HCL 4 MG/2ML IJ SOLN: 4 MG/2ML | INTRAMUSCULAR | Qty: 4

## 2022-07-08 NOTE — ED Triage Notes (Signed)
Pt complaining of generalized abdominal pain that started tonight. Pt sts she vomited once about 58mins pta
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
## 2022-07-08 NOTE — ED Notes (Signed)
Patient reports pain is improved, patient ambulatory with steady gait to rest room, specimens obtained.

## 2022-08-05 ENCOUNTER — Ambulatory Visit: Payer: MEDICAID | Attending: Family Medicine | Primary: Family Medicine

## 2022-08-10 ENCOUNTER — Encounter

## 2022-08-10 NOTE — Telephone Encounter (Signed)
[facility] faxed request #[id number]  [place] [address]    LISINOPRIL 20 MG  QTY 90    Take 1 tablet by mouth every day

## 2022-08-11 MED ORDER — ONETOUCH ULTRA 2 W/DEVICE KIT
PACK | Freq: Every day | 0 refills | Status: DC
Start: 2022-08-11 — End: 2022-09-02

## 2022-08-11 MED ORDER — LISINOPRIL 20 MG PO TABS
20 MG | ORAL_TABLET | Freq: Every day | ORAL | 1 refills | Status: AC
Start: 2022-08-11 — End: 2022-09-09

## 2022-08-22 ENCOUNTER — Encounter

## 2022-08-23 ENCOUNTER — Encounter

## 2022-08-23 NOTE — Telephone Encounter (Signed)
[facility] pharmacy [phone] [facility] faxed 90 day refill request:    Metoprolol Succ 25 mg Tab  Sig: Take 1 tablet by mouth every at night.  Quantity: 90    Ldm

## 2022-08-24 MED ORDER — METOPROLOL SUCCINATE ER 25 MG PO TB24
25 MG | ORAL_TABLET | Freq: Every evening | ORAL | 1 refills | Status: DC
Start: 2022-08-24 — End: 2023-06-06

## 2022-08-24 MED ORDER — ROSUVASTATIN CALCIUM 20 MG PO TABS
20 MG | ORAL_TABLET | ORAL | 3 refills | Status: DC
Start: 2022-08-24 — End: 2023-08-30

## 2022-09-02 ENCOUNTER — Inpatient Hospital Stay: Admit: 2022-09-02 | Discharge: 2022-09-07 | Payer: MEDICAID | Primary: Family Medicine

## 2022-09-02 DIAGNOSIS — Z01818 Encounter for other preprocedural examination: Secondary | ICD-10-CM

## 2022-09-02 LAB — CBC WITH AUTO DIFFERENTIAL
Basophils %: 1 % (ref 0–1)
Basophils Absolute: 0.1 10*3/uL (ref 0.0–0.1)
Eosinophils %: 4 % (ref 0–7)
Eosinophils Absolute: 0.4 10*3/uL (ref 0.0–0.4)
Hematocrit: 39 % (ref 35.0–47.0)
Hemoglobin: 12.2 g/dL (ref 11.5–16.0)
Immature Granulocytes %: 0 % (ref 0.0–0.5)
Immature Granulocytes Absolute: 0 10*3/uL (ref 0.00–0.04)
Lymphocytes %: 27 % (ref 12–49)
Lymphocytes Absolute: 2.6 10*3/uL (ref 0.8–3.5)
MCH: 25.3 PG — ABNORMAL LOW (ref 26.0–34.0)
MCHC: 31.3 g/dL (ref 30.0–36.5)
MCV: 80.7 FL (ref 80.0–99.0)
MPV: 11.9 FL (ref 8.9–12.9)
Monocytes %: 6 % (ref 5–13)
Monocytes Absolute: 0.6 10*3/uL (ref 0.0–1.0)
Neutrophils %: 62 % (ref 32–75)
Neutrophils Absolute: 5.8 10*3/uL (ref 1.8–8.0)
Nucleated RBCs: 0 PER 100 WBC
Platelets: 400 10*3/uL (ref 150–400)
RBC: 4.83 M/uL (ref 3.80–5.20)
RDW: 15 % — ABNORMAL HIGH (ref 11.5–14.5)
WBC: 9.5 10*3/uL (ref 3.6–11.0)
nRBC: 0 10*3/uL (ref 0.00–0.01)

## 2022-09-02 LAB — BASIC METABOLIC PANEL
Anion Gap: 8 mmol/L (ref 5–15)
BUN/Creatinine Ratio: 8 — ABNORMAL LOW (ref 12–20)
BUN: 8 MG/DL (ref 6–20)
CO2: 25 mmol/L (ref 21–32)
Calcium: 9.7 MG/DL (ref 8.5–10.1)
Chloride: 100 mmol/L (ref 97–108)
Creatinine: 0.97 MG/DL (ref 0.55–1.02)
Est, Glom Filt Rate: 74 mL/min/{1.73_m2} (ref >60)
Glucose: 402 mg/dL — ABNORMAL HIGH (ref 65–100)
Potassium: 4 mmol/L (ref 3.5–5.1)
Sodium: 133 mmol/L — ABNORMAL LOW (ref 136–145)

## 2022-09-02 LAB — HEMOGLOBIN A1C
Estimated Avg Glucose: 321 mg/dL
Hemoglobin A1C: 12.8 % — ABNORMAL HIGH (ref 4.0–5.6)

## 2022-09-02 NOTE — Other (Addendum)
BMP and A1C EMR to surgeon and PCP. EMR note to both regarding BP also.

## 2022-09-02 NOTE — Discharge Instructions (Addendum)
[facility]                   [address]   [place], [place] [address]   PRE-ADMISSION TESTING    [phone]     Surgery Date:      Is surgery arrival time given by surgeon?  NO  If "NO", [facility] staff will call you between 3 and 7pm the day before your surgery with your arrival time. (If your surgery is on a Monday, we will call you the Friday before.)    Call [phone] after 7pm Monday-Friday if you did not receive this call.    INSTRUCTIONS BEFORE YOUR SURGERY   When You  Arrive    Arrive at the 2nd Floor Admitting Desk on the day of your surgery     Have your insurance card, photo ID, and any copayment (if needed)   Food   and   Drink    NO food or drink after midnight the night before surgery       This means NO water, gum, mints, coffee, juice, etc.     No alcohol (beer, wine, liquor) 24 hours before and after surgery   Medications to TAKE Morning of Surgery      MEDICATIONS TO TAKE THE MORNING OF SURGERY WITH A SMALL SIP OF WATER:                    Medications to STOP 7 Days Before Surgery Non-Steroidal anti-inflammatory Drugs (NSAID's): for example: Ibuprofen, Advil, Motrin, Naproxen, Aleve  Aspirin, if taking for pain  Herbal supplements, vitamins, and fish oil  Other:  (Pain medications not listed above, including Tylenol, may be taken)   Blood Thinners    If you take Aspirin, Plavix, Coumadin, or any blood-thinning or anti blood-clotting medicine,     talk to the doctor who prescribed the medications for pre-operative instructions.    Bathing   Clothing  Jewelry  Valuables       If you shower the morning of surgery, please do not apply anything to your skin (lotions, powders, deodorant, or makeup, especially mascara)  Follow Chlorhexidine Care Fusion body wash instructions provided to you during PAT appointment. Begin 3 days prior to surgery.  Do not shave or trim anywhere 24 hours before surgery  Wear your hair loose or down; no pony-tails, buns, or metal hair  clips  Wear loose, comfortable, clean clothes  Wear glasses instead of contacts  Leave money, valuables, and jewelry, including body piercings, at home  If you were given an [employer], bring it on day of surgery.     Going Home - or Spending the Night SAME-DAY SURGERY: You must have a responsible adult drive you home and stay with you 24 hours after surgery  ADMITS: If your doctor is keeping you in the hospital after surgery, leave personal belongings/luggage in your car until you have a hospital room number.       Hospital discharge time is 12 noon     Drivers must be here before 12 noon unless you are told differently   Special Instructions           Follow all instructions so your surgery won't be cancelled.  Please, be on time.                    If a situation occurs and you are delayed the day of surgery, call [phone]  or [phone].    If your physical condition changes (like a fever, cold, flu, etc.) call your surgeon.    Home medication(s) reviewed and verified verbally with list during PAT appointment.

## 2022-09-02 NOTE — Other (Addendum)
Patient here for pre-admission testing for upcoming surgery with Dr. Laural Benes. Patient's blood pressure readings are grossly elevated (172/103, 180/105, 177/101). Patient states she has a history of hypertension and is currently prescribed Lisinopril and Metoprolol as treatment but for the last two days she has not taken either medication. She also reports she does not use her CPAP machine at night and her last hemoglobin A1c was "13 something". Patient educated on the importance of taking blood pressure medication as prescribed and the consequences of uncontrolled hypertension and uncontrolled diabetes. Instructed patient to call 911 for any signs or symptoms of stroke or hyperglycemia. Encouraged patient to go to the ER for further evaluation and to follow up with PCP as soon as possible regarding this. Patient verbalized understanding. BMP, EKG, and Hgba1c ordered per protocol.

## 2022-09-02 NOTE — Other (Signed)
Abnormal A1c and BMP faxed to surgeon and PCP.

## 2022-09-02 NOTE — Other (Addendum)
[facility]                   [address] [place], [place] [address]   PRE-ADMISSION TESTING    [phone]     Surgery Date:  09/16/2022       Is surgery arrival time given by surgeon?  NO  If "NO", [facility] staff will call you between 3 and 7pm the day before your surgery with your arrival time. (If your surgery is on a Monday, we will call you the Friday before.)    Call [phone] after 7pm Monday-Friday if you did not receive this call.    INSTRUCTIONS BEFORE YOUR SURGERY   When You  Arrive    Arrive at the 2nd Floor Admitting Desk on the day of your surgery     Have your insurance card, photo ID, and any copayment (if needed)   Food   and   Drink    NO food or drink after midnight the night before surgery       This means NO water, gum, mints, coffee, juice, etc.     No alcohol (beer, wine, liquor) 24 hours before and after surgery   Medications to TAKE Morning of Surgery      MEDICATIONS TO TAKE THE MORNING OF SURGERY WITH A SMALL SIP OF WATER:          Metoprolol, Valacyclovir; check with your PCP for instructions regarding your [place]     Medications to STOP 7 Days Before Surgery Non-Steroidal anti-inflammatory Drugs (NSAID's): for example: Ibuprofen, Advil, Motrin, Naproxen, Aleve  Aspirin, if taking for pain  Herbal supplements, vitamins, and fish oil  Other:  (Pain medications not listed above, including Tylenol, may be taken)   Blood Thinners    If you take Aspirin, Plavix, Coumadin, or any blood-thinning or anti blood-clotting medicine,     talk to the doctor who prescribed the medications for pre-operative instructions.    Bathing   Clothing  Jewelry  Valuables       If you shower the morning of surgery, please do not apply anything to your skin (lotions, powders, deodorant, or makeup, especially mascara)  Follow Chlorhexidine Care Fusion body wash instructions provided to you during PAT appointment. Begin 3 days prior to surgery.  Do not shave or trim anywhere 24  hours before surgery  Wear your hair loose or down; no pony-tails, buns, or metal hair clips  Wear loose, comfortable, clean clothes  Wear glasses instead of contacts  Leave money, valuables, and jewelry, including body piercings, at home  If you were given an [employer], bring it on day of surgery.     Going Home - or Spending the Night SAME-DAY SURGERY: You must have a responsible adult drive you home and stay with you 24 hours after surgery  ADMITS: If your doctor is keeping you in the hospital after surgery, leave personal belongings/luggage in your car until you have a hospital room number.       Hospital discharge time is 12 noon     Drivers must be here before 12 noon unless you are told differently   Special Instructions      Check your blood sugar the morning of surgery; if it is low you may take glucose tablets.      Follow all instructions so your surgery won't be cancelled.  Please, be on time.  If a situation occurs and you are delayed the day of surgery, call [phone].    If your physical condition changes (like a fever, cold, flu, etc.) call your surgeon.    Home medication(s) reviewed and verified verbally with list during PAT appointment.

## 2022-09-04 LAB — EKG 12-LEAD
Atrial Rate: 91 {beats}/min
Diagnosis: NORMAL
P Axis: 34 degrees
P-R Interval: 154 ms
Q-T Interval: 378 ms
QRS Duration: 84 ms
QTc Calculation (Bazett): 464 ms
R Axis: 86 degrees
T Axis: 37 degrees
Ventricular Rate: 91 {beats}/min

## 2022-09-09 ENCOUNTER — Ambulatory Visit: Admit: 2022-09-09 | Discharge: 2022-09-09 | Payer: MEDICAID | Attending: Family Medicine | Primary: Family Medicine

## 2022-09-09 DIAGNOSIS — E1140 Type 2 diabetes mellitus with diabetic neuropathy, unspecified: Secondary | ICD-10-CM

## 2022-09-09 MED ORDER — TOUJEO MAX SOLOSTAR 300 UNIT/ML SC SOPN
300 UNIT/ML | Freq: Two times a day (BID) | SUBCUTANEOUS | 5 refills | Status: DC
Start: 2022-09-09 — End: 2023-06-01

## 2022-09-09 MED ORDER — ALPHA-LIPOIC ACID 600 MG PO TABS
600 MG | ORAL_TABLET | Freq: Every day | ORAL | 3 refills | Status: DC
Start: 2022-09-09 — End: 2023-06-01

## 2022-09-09 MED ORDER — LISINOPRIL 40 MG PO TABS
40 MG | ORAL_TABLET | Freq: Every day | ORAL | 3 refills | Status: DC
Start: 2022-09-09 — End: 2023-09-26

## 2022-09-09 MED ORDER — INSULIN LISPRO (1 UNIT DIAL) 100 UNIT/ML SC SOPN
100 UNIT/ML | Freq: Three times a day (TID) | SUBCUTANEOUS | 4 refills | Status: DC
Start: 2022-09-09 — End: 2023-06-01

## 2022-09-09 MED ORDER — FREESTYLE LIBRE 3 SENSOR MISC
5 refills | Status: DC
Start: 2022-09-09 — End: 2023-01-23

## 2022-09-09 MED ORDER — CLOTRIMAZOLE 1 % EX CREA
1 | CUTANEOUS | 0 refills | Status: AC
Start: 2022-09-09 — End: 2022-09-16

## 2022-09-09 NOTE — Assessment & Plan Note (Signed)
Uncontrolled, continue current medications, but increase mealtime Lispro to 10U TID and work on lifestyle.

## 2022-09-09 NOTE — Patient Instructions (Signed)
Keywords and Lifestyle Strategies to consider and look up:    ACTIVITY:  Strength and muscle building burns fats even on rest/recover days, and can even help with healthy physiologic testosterone production (men and women) to make it easier to regulate your metabolism.    Cardio keeps the heart strong, 30 minutes of moderate activity 5 days per week is the goal.    High Intensity Interval Training or H-I-I-T; a method of exercising more intensely with a cominbination of muscle/strength techniques and cardio for 15-20 minutes 3 days per week.    FOODS:  LOW CARBOHYDRATE. HIGH FIBER. HEALTHY FATS. ANTIOXIDANT RICH.    Low carbohydrates:  Carbohydrates are a luxury energy source, quick burning, quick storage, inflammation promoting.    Carbs include simple sugars found in soda pop, sweet tea, juices, and desserts. Other carbohydrate sources that lead to trouble is found in white rice, pasta, noodles, white bread, white potatoes.    But sometimes we should eat some carbs, but they need to be high fiber. In fact, in nature carbohydrates are never found without fiber or protein, but modern food processing has removed those important ingredients, separating things that never should have been separated.     Carbohydrates are also designed for high energy-demanding activities such as running, using our muscles, dancing, etc. And when we only eat them for these situations, it's called Carbohydrate cycling (you reserve carbohydrate consumption primarily for workout days, with overall carb decrease for the entire week). Moving the remaining carb intake primarily to workout days maximizes your physical energy, provides an incentive for workouts, and cycles your metabolism to a more fat-burning mode on your off-days.    Another way to help cycle and "reset" your metabolism is to do intermittent fasting. It is acceptable and helpful (16, 24, or 36 hours in a row) for "reseting" your metabolism by allowing it to temporarily cycle  to a fat-burning mode; you must make sure to drink lots of water and electrolytes (sugar-free), and that first meal after the fasting must be low carb. Consider looking into Harrison Mons MD ([email or web address]).      Fiber:  High Fiber with water increase helps regulate gut microbiome and intestinal health, which boosts the metabolism.  (See education handout)      Healthy Fats:  Mediterranean Diet is well balanced with lean meats, healthy unsaturated fats, high fiber carbs that your body needs.  (See education handout)      Fighting Oxidation and Inflammation:    Work to eat more antioxidant-rich foods! Metabolic disorders are partly due to chronic oxidative stress/damage to cells and mitochondria dysregulation.    Fruits and vegetables are great sources of antioxidants, especially blueberries, cranberries, raspberries, strawberries, prunes, black beans, dark chocolate, apples, kale, broccoli. Also, fermented foods such as kefir, tempeh, natto, kombucha, miso, kimchi, sauerkraut, probiotic yogurt, cottage cheese, apple cider vinegar, carry antioxidant and gut-health properties.    In fact, some antioxidants such as Alpha Lipoic Acid 600mg  BID for 4-6 months has been shown in animal studies and in preliminary human studies to take the brake off of metabolic syndrome. But, be careful of upset stomach, and rare allergic reactions, as it is a sulfur-based compound. There are other antioxidant supplements, but studies are still needing to be done more to figure out how effective these are for humans.    Also, working on sleep hygiene (visit [employer] website on sleep hygiene for more information: https://www.sleepfoundation.org/sleep-hygiene). Better sleep means better repair.    Nose-breathing has  also been shown to increase Nitric Oxide which is made in our sinuses.     Getting outside regularly, getting some sunlight exposure, touching the grass, breathing fresh air, getting near  running water, listening to the birds. These have real physiologic health benefits, and numerous mental health benefits. Working on mental health drastically improves oxidative stress and inflammation problems.    BELOW ARE SOME EXAMPLE REGIMENS UTILIZING THE ABOVE:    ### Meal Plan    Focus on a heart-healthy diet, rich in fruits, vegetables, whole grains, lean proteins, and healthy fats. Limit sodium, saturated fats, and sugars.    #### Daily Meal Structure  - **Breakfast:** High-fiber, moderate protein, low sugar  - **Lunch:** Balanced mix of veggies, lean protein, and whole grains  - **Dinner:** Light on carbs, rich in vegetables and lean protein  - **Snacks:** Fruits, nuts, yogurt, or vegetables    #### Weekly Menu    **Monday**  - **Breakfast:** Oatmeal with berries and a sprinkle of chia seeds  - **Lunch:** Grilled chicken salad with mixed greens, tomatoes, cucumbers, and a light vinaigrette  - **Dinner:** Baked salmon with steamed broccoli and quinoa  - **Snacks:** Apple slices with almond butter, carrot sticks    **Tuesday**  - **Breakfast:** [place] yogurt with honey and sliced almonds  - **Lunch:** Quinoa bowl with black beans, corn, avocado, and salsa  - **Dinner:** [place] meatloaf with green beans and mashed cauliflower  - **Snacks:** Mixed nuts, celery sticks with hummus    **Wednesday**  - **Breakfast:** Smoothie with spinach, banana, berries, and protein powder  - **Lunch:** Lentil soup with a side salad  - **Dinner:** Stir-fried tofu with mixed vegetables and brown rice  - **Snacks:** Cottage cheese with pineapple, cherry tomatoes    **Thursday**  - **Breakfast:** Whole grain toast with avocado and poached egg  - **Lunch:** Chickpea salad with cucumbers, tomatoes, olives, and feta  - **Dinner:** Baked chicken breast with asparagus and sweet potato  - **Snacks:** Sliced bell peppers, low-fat cheese stick    **Friday**  - **Breakfast:** Scrambled eggs with spinach and whole grain toast  - **Lunch:**  Whole grain wrap with [place], avocado, lettuce, and tomato  - **Dinner:** Shrimp stir-fry with broccoli and snap peas over quinoa  - **Snacks:** Orange slices, handful of walnuts    **Saturday**  - **Breakfast:** Smoothie bowl with blended berries, banana, and topped with granola  - **Lunch:** Grilled vegetable and hummus pita  - **Dinner:** Grilled lean steak with roasted Brussels sprouts and a small baked potato  - **Snacks:** Fresh berries, cucumber slices with tzatziki    **Sunday**  - **Breakfast:** Steel-cut oats with sliced bananas and a sprinkle of flaxseeds  - **Lunch:** Spinach and mushroom frittata with a side salad  - **Dinner:** Baked cod with roasted carrots and wild rice  - **Snacks:** Pear slices, air-popped popcorn    ### Exercise Regimen    Incorporate both cardiovascular and strength training exercises to improve heart health and overall fitness.    #### Weekly Schedule    **Monday**  - **Cardio:** 30 minutes of brisk walking or stationary biking  - Scientific laboratory technician:** Bodyweight exercises (squats, lunges, push-ups, planks) for 20 minutes    **Tuesday**  - **Cardio:** 30 minutes of swimming or water aerobics  - **Flexibility:** 20 minutes of yoga or stretching    **Wednesday**  - **Cardio:** 30 minutes of brisk walking or elliptical  - **Strength Training:** Light weights (bicep curls, tricep dips, shoulder press) for 20 minutes    **Thursday**  - **  Cardio:** 30 minutes of dancing or aerobics class  - **Flexibility:** 20 minutes of Pilates or stretching    **Friday**  - **Cardio:** 30 minutes of cycling or fast-paced walking  - Scientific laboratory technician:** Bodyweight exercises for 20 minutes    **Saturday**  - **Cardio:** 30 minutes of hiking or a long walk  - **Flexibility:** 20 minutes of yoga or stretching    **Sunday**  - **Rest Day:** Light stretching or a leisurely walk    ### Tips for Staying on Track    1. **Preparation:** Spend time on Sunday to meal prep for the week. Pre-cut vegetables,  portion out snacks, and plan your meals.  2. **Tracking:** Use a simple app or a journal to log meals and exercise. Set reminders on your phone for meal and workout times.  3. **Hydration:** Drink plenty of water throughout the day to stay hydrated.  4. **Commute Exercise:** Consider using part of your commute for exercise if possible, such as parking further away or taking public transport part of the way and walking the rest.  5. **Support:** Find a workout buddy or join a class to stay motivated and accountable.    ### Important Notes    - **Consult Your Doctor:** Before starting any new diet or exercise regimen, it's crucial to consult with your healthcare provider to ensure it's safe and appropriate for your specific health conditions.  - **Progress Gradually:** Start with lighter activities and gradually increase intensity as you build strength and endurance.    This plan aims to balance nutrition and physical activity while accommodating your busy schedule.

## 2022-09-09 NOTE — Progress Notes (Signed)
Chief Complaint   Patient presents with    Follow-up     6 wk fu has been making changes to diet to include reducing carbs etc.  Has some good days and not so good day with her mood.  Mostly ok.  Sugars have been elevated recent A1c 12.8.       Diabetes

## 2022-09-09 NOTE — Assessment & Plan Note (Signed)
Agree with PT, work on weight loss.

## 2022-09-09 NOTE — Progress Notes (Signed)
SUBJECTIVES  with respective ASSESSMENT/PLAN:  Alyssa [name] is a 42 y.o. female established patient who presents for Follow-up (6 wk fu has been making changes to diet to include reducing carbs etc.  Has some good days and not so good day with her mood.  Mostly ok.  Sugars have been elevated recent A1c 12.8.   ) and Diabetes  , found to have the following:    1. Type 2 diabetes mellitus with diabetic neuropathy, with long-term current use of insulin (HCC)  Overview:  Hemoglobin A1C   Date Value Ref Range Status   09/02/2022 12.8 (H) 4.0 - 5.6 % Final     Comment:     (NOTE)  HbA1C Interpretive Ranges  <5.7              Normal  5.7 - 6.4         Consider Prediabetes  >6.5              Consider Diabetes       Medication: glargine 160U daily, Lispro 6U TID, metformin 1000mg  BID.  Assessment & Plan:   Uncontrolled, continue current medications, but increase mealtime Lispro to 10U TID and work on lifestyle.  Orders:  -     [facility] - Phillingane, Justin, DPM, Podiatry, [place]  -     Continuous Glucose Sensor (FREESTYLE LIBRE 3 SENSOR) MISC; 1 each by Does not apply route every 14 days, Disp-2 each, R-5Normal  -     Insulin Glargine, 2 Unit Dial, (TOUJEO MAX SOLOSTAR) 300 UNIT/ML SOPN; Inject 80 Units into the skin in the morning and at bedtime, Disp-14 Adjustable Dose Pre-filled Pen Syringe, R-5Adjust Sig  -     insulin lispro, 1 Unit Dial, (HUMALOG KWIKPEN) 100 UNIT/ML SOPN; Inject 10 Units into the skin 3 times daily (before meals), Disp-5 Adjustable Dose Pre-filled Pen Syringe, R-4Adjust Sig  -     Basic Metabolic Panel; Future  2. Tinea pedis of both feet  -     clotrimazole (LOTRIMIN AF) 1 % cream; Apply topically 2 times daily., Disp-40 g, R-0, Normal  3. Pes planus of both feet  -     [facility] - Phillingane, Justin, DPM, Podiatry, [place]  4. Bilateral chronic knee pain  Overview:  Decades, response to PT in the past. No progression. No red flags.  Assessment & Plan:  Agree with PT, work on weight  loss.  Orders:  -     External Referral To Physical Therapy  5. Chronic pain of both ankles  Overview:  Decades, response to PT in the past. No progression. No red flags.  Assessment & Plan:  Agree with PT, work on weight loss.  Orders:  -     External Referral To Physical Therapy  6. Essential hypertension  Overview:  BP Readings from Last 3 Encounters:   09/09/22 (!) 166/99   09/02/22 (!) 177/101   07/08/22 (!) 154/97     Medication: lisinopril 20mg  daily, metoprolol succinate 25mg  daily.    Interval Hx:  - patient's blood pressure has been above goal a couple visits, was resistant to changes, working on lifestyle further. But upcoming surgery.  - asymptomatic today.  Assessment & Plan:   Uncontrolled, continue current medications but increase lisinopril to 40mg  daily. Continue metoprolol succinate 25mg  daily. Nurse BP visit next week Wednesday.  Orders:  -     lisinopril (PRINIVIL;ZESTRIL) 40 MG tablet; Take 1 tablet by mouth daily, Disp-90 tablet, R-3Normal  -  Basic Metabolic Panel; Future  7. Type 2 diabetes mellitus treated with insulin (HCC)  -     lisinopril (PRINIVIL;ZESTRIL) 40 MG tablet; Take 1 tablet by mouth daily, Disp-90 tablet, R-3Normal  -     Insulin Glargine, 2 Unit Dial, (TOUJEO MAX SOLOSTAR) 300 UNIT/ML SOPN; Inject 80 Units into the skin in the morning and at bedtime, Disp-14 Adjustable Dose Pre-filled Pen Syringe, R-5Adjust Sig  -     insulin lispro, 1 Unit Dial, (HUMALOG KWIKPEN) 100 UNIT/ML SOPN; Inject 10 Units into the skin 3 times daily (before meals), Disp-5 Adjustable Dose Pre-filled Pen Syringe, R-4Adjust Sig  8. Type 2 diabetes with nephropathy (HCC)  -     Insulin Glargine, 2 Unit Dial, (TOUJEO MAX SOLOSTAR) 300 UNIT/ML SOPN; Inject 80 Units into the skin in the morning and at bedtime, Disp-14 Adjustable Dose Pre-filled Pen Syringe, R-5Adjust Sig  -     insulin lispro, 1 Unit Dial, (HUMALOG KWIKPEN) 100 UNIT/ML SOPN; Inject 10 Units into the skin 3 times daily (before meals),  Disp-5 Adjustable Dose Pre-filled Pen Syringe, R-4Adjust Sig  9. Type 2 diabetes mellitus with hyperglycemia, with long-term current use of insulin (HCC)  -     Insulin Glargine, 2 Unit Dial, (TOUJEO MAX SOLOSTAR) 300 UNIT/ML SOPN; Inject 80 Units into the skin in the morning and at bedtime, Disp-14 Adjustable Dose Pre-filled Pen Syringe, R-5Adjust Sig  -     insulin lispro, 1 Unit Dial, (HUMALOG KWIKPEN) 100 UNIT/ML SOPN; Inject 10 Units into the skin 3 times daily (before meals), Disp-5 Adjustable Dose Pre-filled Pen Syringe, R-4Adjust Sig        It was a pleasure seeing [name] today.    Return in about 4 months (around 01/10/2023) for chronic disease management.      The following portions of the patient's history were reviewed and updated as appropriate: allergies, current medications, family history, medical history, social history, surgical history and problem list.    OBJECTIVE FINDINGS:    BP (!) 166/99   Pulse (!) 107   Temp 98.3 F (36.8 C)   Resp 20   Ht 1.6 m (5\' 3" )   Wt 113.4 kg (250 lb)   SpO2 97%   BMI 44.29 kg/m     Physical exam:  Constitutional: well-appearing, no acute distress  Eyes: EOM intact. PERRL bilateral. Not icteric.  Cardiac: normal rate, regular rhythm.  Pulm: normal rate, normal effort.  Skin: normal color and turgor.  Lower extrem: no edema.      Note: relevant labs reviewed and noted in the Overview section above.      An electronic signature was used to authenticate this note.     Portions of this note may have been populated using smart dictation software and may have "sounds-like" errors present.     Pt was counseled on risks, benefits and alternatives of treatment options. All questions were asked and answered and the patient was agreeable with the treatment plan as outlined.    Langley Gauss, MD  [facility]  [phone]

## 2022-09-09 NOTE — Assessment & Plan Note (Signed)
Agree with PT, work on weight loss.

## 2022-09-09 NOTE — Assessment & Plan Note (Signed)
Uncontrolled, continue current medications but increase lisinopril to 40mg  daily. Continue metoprolol succinate 25mg  daily. Nurse BP visit next week Wednesday.

## 2022-09-14 ENCOUNTER — Ambulatory Visit: Admit: 2022-09-14 | Discharge: 2022-09-14 | Payer: MEDICAID | Primary: Family Medicine

## 2022-09-14 DIAGNOSIS — I10 Essential (primary) hypertension: Secondary | ICD-10-CM

## 2022-09-16 ENCOUNTER — Inpatient Hospital Stay: Payer: Medicaid (Managed Care) | Attending: Surgery

## 2022-09-16 LAB — POCT GLUCOSE
POC Glucose: 360 mg/dL — ABNORMAL HIGH (ref 65–117)
POC Glucose: 261 mg/dL — ABNORMAL HIGH (ref 65–117)
POC Glucose: 279 mg/dL — ABNORMAL HIGH (ref 65–117)

## 2022-09-16 MED ORDER — ACETAMINOPHEN 325 MG PO TABS
325 | Freq: Once | ORAL | Status: AC
Start: 2022-09-16 — End: 2022-09-16
  Administered 2022-09-16: 14:00:00 650 mg via ORAL

## 2022-09-16 MED ORDER — INSULIN REGULAR HUMAN 100 UNIT/ML IJ SOLN
100 | Freq: Once | INTRAMUSCULAR | Status: AC
Start: 2022-09-16 — End: 2022-09-16
  Administered 2022-09-16: 14:00:00 5 [IU] via INTRAVENOUS

## 2022-09-16 MED ORDER — LIDOCAINE HCL (PF) 2 % IJ SOLN
2 % | INTRAMUSCULAR | Status: DC | PRN
  Administered 2022-09-16: 15:00:00 100 via INTRAVENOUS

## 2022-09-16 MED ORDER — PROPOFOL 500 MG/50ML IV EMUL
500 | INTRAVENOUS | Status: AC
Start: 2022-09-16 — End: ?

## 2022-09-16 MED ORDER — FENTANYL CITRATE (PF) 100 MCG/2ML IJ SOLN
100 | Freq: Once | INTRAMUSCULAR | Status: DC | PRN
Start: 2022-09-16 — End: 2022-09-16

## 2022-09-16 MED ORDER — METOPROLOL TARTRATE 5 MG/5ML IV SOLN
5 MG/5ML | INTRAVENOUS | Status: DC | PRN
  Administered 2022-09-16: 16:00:00 5 via INTRAVENOUS

## 2022-09-16 MED ORDER — MIDAZOLAM HCL 2 MG/2ML IJ SOLN
2 | INTRAMUSCULAR | Status: AC
Start: 2022-09-16 — End: ?

## 2022-09-16 MED ORDER — MIDAZOLAM HCL (PF) 2 MG/2ML IJ SOLN
2 | Freq: Once | INTRAMUSCULAR | Status: DC | PRN
Start: 2022-09-16 — End: 2022-09-16

## 2022-09-16 MED ORDER — CEFAZOLIN SODIUM 1 G IJ SOLR
1 | Freq: Once | INTRAMUSCULAR | Status: AC
Start: 2022-09-16 — End: 2022-09-16
  Administered 2022-09-16: 16:00:00 2000 mg via INTRAVENOUS

## 2022-09-16 MED ORDER — BUPIVACAINE HCL (PF) 0.5 % IJ SOLN
0.5 | INTRAMUSCULAR | Status: AC
Start: 2022-09-16 — End: ?

## 2022-09-16 MED ORDER — BUPIVACAINE HCL (PF) 0.5 % IJ SOLN
0.5 | INTRAMUSCULAR | Status: DC | PRN
Start: 2022-09-16 — End: 2022-09-16
  Administered 2022-09-16: 16:00:00 50

## 2022-09-16 MED ORDER — LIDOCAINE HCL (PF) 1 % IJ SOLN
1 | Freq: Once | INTRAMUSCULAR | Status: DC | PRN
Start: 2022-09-16 — End: 2022-09-16

## 2022-09-16 MED ORDER — MIDAZOLAM HCL 2 MG/2ML IJ SOLN
2 MG/2ML | INTRAMUSCULAR | Status: DC | PRN
  Administered 2022-09-16 (×2): 2 via INTRAVENOUS

## 2022-09-16 MED ORDER — LACTATED RINGERS IV SOLN
INTRAVENOUS | Status: DC
Start: 2022-09-16 — End: 2022-09-16
  Administered 2022-09-16: 14:00:00 via INTRAVENOUS

## 2022-09-16 MED ORDER — LIDOCAINE HCL (PF) 2 % IJ SOLN
2 | INTRAMUSCULAR | Status: AC
Start: 2022-09-16 — End: ?

## 2022-09-16 MED ORDER — PROPOFOL 500 MG/50ML IV EMUL
500 MG/50ML | INTRAVENOUS | Status: DC | PRN
  Administered 2022-09-16 (×2): 50 via INTRAVENOUS
  Administered 2022-09-16: 16:00:00 100 via INTRAVENOUS
  Administered 2022-09-16: 16:00:00 70 via INTRAVENOUS
  Administered 2022-09-16: 16:00:00 50 via INTRAVENOUS

## 2022-09-16 MED ORDER — DEXMEDETOMIDINE HCL 200 MCG/2ML IV SOLN
200 MCG/2ML | INTRAVENOUS | Status: DC | PRN
  Administered 2022-09-16 (×2): 8 via INTRAVENOUS
  Administered 2022-09-16: 16:00:00 12 via INTRAVENOUS
  Administered 2022-09-16: 16:00:00 8 via INTRAVENOUS

## 2022-09-16 MED ORDER — BUPIVACAINE LIPOSOME 1.3 % IJ SUSP
1.3 | INTRAMUSCULAR | Status: AC
Start: 2022-09-16 — End: ?

## 2022-09-16 MED FILL — LACTATED RINGERS IV SOLN: INTRAVENOUS | Qty: 1000

## 2022-09-16 MED FILL — ACETAMINOPHEN 325 MG PO TABS: 325 MG | ORAL | Qty: 2

## 2022-09-16 MED FILL — PROPOFOL 500 MG/50ML IV EMUL: 500 MG/50ML | INTRAVENOUS | Qty: 50

## 2022-09-16 MED FILL — MIDAZOLAM HCL 2 MG/2ML IJ SOLN: 2 MG/ML | INTRAMUSCULAR | Qty: 2

## 2022-09-16 MED FILL — NOVOLIN R 100 UNIT/ML IJ SOLN: 100 UNIT/ML | INTRAMUSCULAR | Qty: 5

## 2022-09-16 MED FILL — EXPAREL 1.3 % IJ SUSP: 1.3 % | INTRAMUSCULAR | Qty: 20

## 2022-09-16 MED FILL — BUPIVACAINE HCL (PF) 0.5 % IJ SOLN: 0.5 % | INTRAMUSCULAR | Qty: 30

## 2022-09-16 MED FILL — CEFAZOLIN SODIUM 1 G IJ SOLR: 1 g | INTRAMUSCULAR | Qty: 2000

## 2022-09-16 MED FILL — LIDOCAINE HCL (PF) 2 % IJ SOLN: 2 % | INTRAMUSCULAR | Qty: 10

## 2022-09-16 NOTE — Anesthesia Post-Procedure Evaluation (Signed)
Department of Anesthesiology  Postprocedure Note    Patient: Alyssa [name]  MRN: [id number]  Birthdate: 12-19-1980  Date of evaluation: 09/16/2022    Procedure Summary       Date: 09/16/22 Room / Location: [facility] F[facility] / [facility]    Anesthesia Start: 1117 Anesthesia Stop: 1226    Procedure: EXCISION OF LEFT elbow LESION (MAC/LOCAL) (Left: Arm Upper) Diagnosis:       Lipoma of left upper extremity      (Lipoma of left upper extremity [D17.22])    Surgeons: Lerry Liner, MD Responsible Provider: Herma Carson, MD    Anesthesia Type: MAC ASA Status: 3            Anesthesia Type: No value filed.    Aldrete Phase I: Aldrete Score: 5    Aldrete Phase II:      Anesthesia Post Evaluation    Patient location during evaluation: PACU  Patient participation: complete - patient participated  Level of consciousness: awake and alert  Pain score: 1  Airway patency: patent  Nausea & Vomiting: no nausea and no vomiting  Cardiovascular status: blood pressure returned to baseline  Respiratory status: acceptable  Hydration status: euvolemic  Pain management: satisfactory to patient    No notable events documented.
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
## 2022-09-16 NOTE — Progress Notes (Signed)
Pt fall protocol  Yellow arm band on patient, yellow non skid socks on   bed in low position, all side rails up, call bell in reach  Pt  & family instructed in "call don't fall" protocol     Use your call bell,and wait for assistance, staff not family will assist you to get up &   move about  Pt & family verbalize understanding of fall precautions and "call don't fall" protocol

## 2022-09-16 NOTE — Discharge Instructions (Addendum)
GENERAL POST-OPERATIVE  PATIENT INSTRUCTIONS      FOLLOW-UP:   Call your physician immediately if you have any fevers greater than 102.5, drainage from you wound that is not clear or looks infected, persistent bleeding, increasing abdominal pain, problems urinating, or persistent nausea/vomiting.      WOUND CARE INSTRUCTIONS:  Keep a dry clean dressing on the wound if there is drainage. The initial bandage may be removed after 24 hours.  Once the wound has quit draining you may leave it open to air.  If clothing rubs against the wound or causes irritation and the wound is not draining you may cover it with a dry dressing during the daytime.  Try to keep the wound dry and avoid ointments on the wound unless directed to do so.  If the wound becomes bright red and painful or starts to drain infected material that is not clear, please contact your physician immediately.  If the wound is mildly pink and has a thick firm ridge underneath it, this is normal, and is referred to as a healing ridge.  This will resolve over the next 4-6 weeks.    DIET:  You may eat any foods that you can tolerate.  It is a good idea to eat a high fiber diet and take in plenty of fluids to prevent constipation.  If you do become constipated you may want to take a mild laxative or take ducolax tablets on a daily basis until your bowel habits are regular.  Constipation can be very uncomfortable, along with straining, after recent surgery.    ACTIVITY:  You are encouraged to cough and deep breath or use your incentive spirometer if you were given one, every 15-30 minutes when awake.  This will help prevent respiratory complications and low grade fevers post-operatively if you had a general anesthetic.  You may want to hug a pillow when coughing and sneezing to add additional support to the surgical area, if you had abdominal or chest surgery, which will decrease pain during these times.  You are encouraged to walk and engage in light activity for  the next two weeks.  You should not lift more than 20 pounds during this time frame as it could put you at increased risk for complications.  Twenty pounds is roughly equivalent to a plastic bag of groceries.      MEDICATIONS:  Try to take narcotic medications and anti-inflammatory medications, such as tylenol, ibuprofen, naprosyn, etc., with food.  This will minimize stomach upset from the medication.  Should you develop nausea and vomiting from the pain medication, or develop a rash, please discontinue the medication and contact your physician.  You should not drive, make important decisions, or operate machinery when taking narcotic pain medication.    QUESTIONS:  Please feel free to call your physician or the hospital operator if you have any questions, and they will be glad to assist you.    DISCHARGE SUMMARY from your Nurse      PATIENT INSTRUCTIONS    After general anesthesia or intravenous sedation, for 24 hours or while taking prescription Narcotics:  Limit your activities  Do not drive and operate hazardous machinery  Do not make important personal or business decisions  Do  not drink alcoholic beverages  If you have not urinated within 8 hours after discharge, please contact your surgeon on call.    Report the following to your surgeon:  Excessive pain, swelling, redness or odor of or around the surgical area  Temperature over 100.5  Nausea and vomiting lasting longer than 4 hours or if unable to take medications  Any signs of decreased circulation or nerve impairment to extremity: change in color, persistent  numbness, tingling, coldness or increase pain  Any questions      GOOD HELP TO FIGHT AN INFECTION  Here are a few tip to help reduce the chance of getting an infection after surgery:  Wash Your Hands  Good handwashing is the most important thing you and your caregiver can do.  Wash before and after caring for any wounds.  Dry your hand with a clean towel.  Wash with soap and water for at least 20  seconds. A TIP: sing the "Happy Birthday" song through one time while washing to help with the timing.  Use a hand sanitizer in between washings.    Shower  When your surgeon says it is OK to take a shower, use a new bar of antibacterial soap (if that is what you use, and keep that bar of soap ONLY for your use), or antibacterial body wash.  Use a clean wash cloth or sponge when you bathe.  Dry off with a clean towel  after every bath - be careful around any wounds, skin staples, sutures or surgical glue over/on wounds.  Do not enter swimming pools, hot tubs, lakes, rivers and/or ocean until wounds are healed and your doctor/surgeon says it is OK.    Use Clean Sheets  Sleep on freshly laundered sheets after your surgery.  Keep the surgery site covered with a clean, dry bandage (if instructed to do so).  If the bandage becomes soiled, reapply a new, dry, clean bandage.   Do not allow pets to sleep with you while your wound is healing.    Lifestyle Modification and Controlling Your Blood Sugar  Smoking slows wound healing.  Stop smoking and limit exposure to second-hand smoke.  High blood sugar slows wound healing.  Eat a well-balanced diet to provide proper nutrition while healing  Monitor your blood sugar (if you are a diabetic) and take your medications as you are suppose to so you can control you blood sugar after surgery.      COUGH AND DEEP BREATHE    Breathing deeply and coughing are very important exercises to do after surgery.  Deep breathing and coughing open the little air tubes and air sacks in your lungs.       You take deep breaths every day.  You may not even notice - it is just something you do when you sigh or yawn.  It is a natural exercise you do to keep these air passages open.      After surgery, take deep breaths and cough, on purpose.    DIRECTIONS:  Take 10 to 15 slow deep breaths every hour while awake.  Breathe in deeply, and hold it for 2 seconds.  Exhale slowly through puckered lips, like  blowing up a balloon.  After every 4th or 5th deep breath, hug your pillow to your chest or belly and give a hard, deep cough.      Yes, it will probably hurt.  But doing this exercise is a very important part of healing after surgery.  Take your pain medicine to help you do this exercise without too much pain.    Coughing and deep breathing help prevent bronchitis and pneumonia after surgery.  If you had chest or belly surgery, use a pillow as a "hug buddy" and  hold it tightly to your chest or belly when you cough.       ANKLE PUMPS    Ankle pumps increase the circulation of oxygenated blood to your lower extremities and decrease your risk for circulation problems such as blood clots. They also stretch the muscles, tendons and ligaments in your foot and ankle, and prevent joint contracture in the ankle and foot, especially after surgeries on the legs.    It is important to do ankle pump exercises regularly after surgery because immobility increases your risk for developing a blood clot.  Your doctor may also have you take an Aspirin for the next few days as well.      If your doctor did not ask you to take an Aspirin, consult with him before starting Aspirin therapy on your own.    The exercise is quite simple.     Slowly point your foot forward, feeling the muscles on the top of your lower leg stretch, and hold this position for 5 seconds.                  Next, pull your foot back toward you as far as possible, stretching the calf muscles, and hold that position for 5 seconds.                 Repeat with the other foot.  Perform 10 repetitions every hour while awake for both ankles if possible (down and then up with the foot once is one repetition).    You should feel gentle stretching of the muscles in your lower leg when doing this exercise.  If you feel pain, or your range of motion is limited, don't push too hard.  Only go the limit your joint and muscles will let you go.  If you have increasing pain,  progressively worsening leg warmth or swelling, STOP the exercise and call your doctor.         Obesity, smoking, and sedentary lifestyle greatly increases your risk for illness and disease.  A healthy diet, regular physical exercise & weight monitoring are important for maintaining a healthy lifestyle.      Congestive Heart Failure  You may be retaining fluid if you have a history of heart failure or if you experience any of the following symptoms:  Weight gain of 3 pounds or more overnight or 5 pounds in a week, increased swelling in your hands or feet or shortness of breath while lying flat in bed.  Please call your doctor as soon as you notice any of these symptoms; do not wait until your next office visit.      Recognize signs and symptoms of STROKE:  F -  Face looks uneven  A -  Arms unable to move or move evenly  S -  Speech slurred or non-existent  T -  Time-call 911 as soon as signs and symptoms begin-DO NOT go          back to bed or wait to see if you get better-TIME IS BRAIN.      Warning Signs of HEART ATTACK   Call 911 if you have these symptoms:    Chest discomfort. Most heart attacks involve discomfort in the center of the chest that lasts more than a few minutes, or that goes away and comes back. It can feel like uncomfortable pressure, squeezing, fullness, or pain.  Discomfort in other areas of the upper body. Symptoms can include pain or discomfort in one or  both arms, the back, neck, jaw, or stomach.  Shortness of breath with or without chest discomfort.  Other signs may include breaking out in a cold sweat, nausea, or lightheadedness.    Don't wait more than five minutes to call 911 - MINUTES MATTER! Fast action can save your life. Calling 911 is almost always the fastest way to get lifesaving treatment. Emergency Medical Services staff can begin treatment when they arrive -- up to an hour sooner than if someone gets to the hospital by car.         CONTENTS FOUND IN YOUR DISCHARGE ENVELOPE:  [x]       Surgeon and Hospital Discharge Instructions  Controlling Post-Operative Pain with EXPAREL   (Bupivacaine Liposome Injectable Suspension)    Pain control after surgery is important for your recovery.  Your surgeon may use different medications to help control your pain, and other techniques such as rest, ice, and elevation may also be helpful.    YOUR SURGEON GAVE YOU EXPAREL     EXPAREL is a local analgesic (a kind of numbing medicine) to help control pain after surgery.  This medicine was injected in/around your incision before the surgery was over.  Sometimes, the anesthesia team will use EXPAREL (if you received a regional anesthesia technique) to help control pain after surgery.    Some things you may want to know:    EXPAREL contains the medication bupivacaine.  it numbs the tissues and nerves around the surgery site.  EXPAREL is specially designed to release the numbing medication slowly over time.  In making the EXPAREL release slowly over time, it may help reduce the number of other medications like narcotics you may need.  In addition to EXPAREL, your doctor may provide you other pain medications to control you pain.  Each patient is different and may respond to pain and pain medications differently.  Depending on how you respond to EXPAREL, you may require less additional pain medication during your recovery.    YOUR RECOVERY    When your pain is under control, your body can focus better on healing.  This is not the time to test your pain tolerance, to "tough it out" or "grin and bear it."  If you feel your pain is not well controlled speak with your surgeon to discuss your pain.    Follow your discharge instructions.  Eat a healthy diet and drink plenty of water. Surgery stresses your body; your body responds by needing more energy to heal.    IMPORTANT PATIENT INFORMATION    Products that contain bupivacaine, like EXPAREL, may cause a temporary loss of sensation or the ability to move the area where  the medicine was injected - this is normal and often the desired outcome of the injection, to help with pain control.  However, the numbness should not last forever.  Follow your post op instructions carefully.  Tell your doctor if you are pregnant, think you are pregnant, or are considering breastfeeding; certain obstetrical and gynecological procedures should not use EXPAREL.  EXPREL has not been studied in patients younger than 18 years.    Side effects can occur with any medication, and it is important not to ignore anything you may be experiencing.  Some patients who received EXPAREL experienced:            Nausea              Vomiting  Constipation    Rarely, patients who receive bupivacaine (the active ingredient in EXPAREL) have experienced numbness and tingling in their mouth or lips, lightheadedness, anxiety, or abnormal heart rate.  Speak with your doctor right away if you think you may be experiencing any of these sensations, or if you have other questions regarding the possible side effects.  Tell your doctor if you have severe liver or kidney disease.    Visit EXPAREL.com or call 1-855-RX-EXPAREL for more information.    DATE GIVEN 5/31     TIME GIVEN 1200     DOSE GIVEN 10mg   Other formulations of bupivacaine should not be administered within 96 hours following administration of EXPAREL     Modified from patient education handout, "Managing Your Pain", [address], [place], [place] [address] form [id number]; 03 July 2019

## 2022-09-16 NOTE — Discharge Summary (Signed)
Discharge Summary    Patient: Alyssa [name]               Sex: female          DOA: 09/16/2022  8:48 AM       Date of Birth:  1980/07/28      Age:  42 y.o.        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  Discharge to home in stable condition.      Consults: None    Significant Diagnostic Studies: See full electronic record.     Discharge Medications:     Current Discharge Medication List        CONTINUE these medications which have NOT CHANGED    Details   clotrimazole (LOTRIMIN AF) 1 % cream Apply topically 2 times daily.  Qty: 40 g, Refills: 0    Associated Diagnoses: Tinea pedis of both feet      lisinopril (PRINIVIL;ZESTRIL) 40 MG tablet Take 1 tablet by mouth daily  Qty: 90 tablet, Refills: 3    Associated Diagnoses: Essential hypertension; Type 2 diabetes mellitus treated with insulin (HCC)      Continuous Glucose Sensor (FREESTYLE LIBRE 3 SENSOR) MISC 1 each by Does not apply route every 14 days  Qty: 2 each, Refills: 5    Associated Diagnoses: Type 2 diabetes mellitus with diabetic neuropathy, with long-term current use of insulin (HCC)      Insulin Glargine, 2 Unit Dial, (TOUJEO MAX SOLOSTAR) 300 UNIT/ML SOPN Inject 80 Units into the skin in the morning and at bedtime  Qty: 14 Adjustable Dose Pre-filled Pen Syringe, Refills: 5    Associated Diagnoses: Type 2 diabetes mellitus with diabetic neuropathy, with long-term current use of insulin (HCC); Type 2 diabetes mellitus treated with insulin (HCC); Type 2 diabetes with nephropathy (HCC); Type 2 diabetes mellitus with hyperglycemia, with long-term current use of insulin (HCC)      insulin lispro, 1 Unit Dial, (HUMALOG KWIKPEN) 100 UNIT/ML SOPN Inject 10 Units into the skin 3 times daily (before  meals)  Qty: 5 Adjustable Dose Pre-filled Pen Syringe, Refills: 4    Associated Diagnoses: Type 2 diabetes mellitus with diabetic neuropathy, with long-term current use of insulin (HCC); Type 2 diabetes mellitus treated with insulin (HCC); Type 2 diabetes with nephropathy (HCC); Type 2 diabetes mellitus with hyperglycemia, with long-term current use of insulin (HCC)      Alpha-Lipoic Acid 600 MG TABS Take 600 mg by mouth daily  Qty: 90 tablet, Refills: 3      acetaminophen (TYLENOL 8 HOUR) 650 MG extended release tablet Take 1 tablet by mouth as needed for Pain      Levocetirizine Dihydrochloride (XYZAL ALLERGY 24HR PO) Take 1 tablet by mouth daily      Specialty Vitamins Products (WEIGHT LOSS DAILY MULTI) TABS Take 3 tablets by mouth in the morning, at noon, and at bedtime      metoprolol succinate (TOPROL XL) 25 MG extended release tablet Take 1 tablet by mouth nightly  Qty: 90 tablet, Refills: 1    Associated Diagnoses: Essential hypertension      rosuvastatin (CRESTOR) 20  MG tablet TAKE 1 TABLET BY MOUTH EVERY DAY AT NIGHT *NEW INSURANCE?*  Qty: 90 tablet, Refills: 3    Associated Diagnoses: Essential hypertension; Type 2 diabetes mellitus with hyperglycemia, without long-term current use of insulin (HCC); Pure hyperglyceridemia      dicyclomine (BENTYL) 20 MG tablet Take 1 tablet by mouth 4 times daily  Qty: 20 tablet, Refills: 0      famotidine (PEPCID) 20 MG tablet Take 1 tablet by mouth 2 times daily  Qty: 60 tablet, Refills: 0      ondansetron (ZOFRAN-ODT) 4 MG disintegrating tablet Take 1 tablet by mouth 3 times daily as needed for Nausea or Vomiting  Qty: 10 tablet, Refills: 0      valACYclovir (VALTREX) 500 MG tablet Take 1 tablet by mouth 2 times daily  Qty: 180 tablet, Refills: 1      metFORMIN (GLUCOPHAGE-XR) 500 MG extended release tablet Take 4 tablets by mouth Daily with supper  Qty: 360 tablet, Refills: 3      RESTASIS 0.05 % ophthalmic emulsion Place 1 drop into both eyes as needed            Activity/Diet/Wound Care: See patient administered discharge instructions.    Follow-up: Keep follow up made by Teola Bradley, MD  [facility]  Office:  [phone]  Fax:  [phone]

## 2022-09-22 NOTE — Telephone Encounter (Signed)
From: Osvaldo Angst  To: Dr. Abundio Miu  Sent: 09/22/2022 8:22 AM EDT  Subject: Chilton Si mucus    I have been coughing this stuff up for the past four days. Sometimes when I cough I feel a tightening in my chest so that for a short time I am only able to take small breathes. Is there anything you can prescribe me to loosen this? I also don't have another inhaler.    I got a pneumonia shot in 2011 but don't know if I need prevnar20.

## 2022-10-07 ENCOUNTER — Emergency Department: Admit: 2022-10-07 | Payer: MEDICAID | Primary: Family Medicine

## 2022-10-07 ENCOUNTER — Ambulatory Visit: Admit: 2022-10-07 | Discharge: 2022-10-07 | Payer: MEDICAID | Attending: Family Medicine | Primary: Family Medicine

## 2022-10-07 ENCOUNTER — Inpatient Hospital Stay
Admit: 2022-10-07 | Discharge: 2022-10-07 | Disposition: A | Discharge status: Home / Self Care | Payer: MEDICAID | Attending: Emergency Medicine

## 2022-10-07 DIAGNOSIS — R1084 Generalized abdominal pain: Secondary | ICD-10-CM

## 2022-10-07 DIAGNOSIS — K5900 Constipation, unspecified: Secondary | ICD-10-CM

## 2022-10-07 LAB — COMPREHENSIVE METABOLIC PANEL
ALT: 18 U/L (ref 12–78)
AST: 10 U/L — ABNORMAL LOW (ref 15–37)
Albumin/Globulin Ratio: 0.9 — ABNORMAL LOW (ref 1.1–2.2)
Albumin: 3.7 g/dL (ref 3.5–5.0)
Alk Phosphatase: 64 U/L (ref 45–117)
Anion Gap: 8 mmol/L (ref 5–15)
BUN/Creatinine Ratio: 8 — ABNORMAL LOW (ref 12–20)
BUN: 5 MG/DL — ABNORMAL LOW (ref 6–20)
CO2: 23 mmol/L (ref 21–32)
Calcium: 9.5 MG/DL (ref 8.5–10.1)
Chloride: 101 mmol/L (ref 97–108)
Creatinine: 0.61 MG/DL (ref 0.55–1.02)
Est, Glom Filt Rate: >90 mL/min/{1.73_m2} (ref >60)
Globulin: 4.3 g/dL — ABNORMAL HIGH (ref 2.0–4.0)
Glucose: 291 mg/dL — ABNORMAL HIGH (ref 65–100)
Potassium: 3.6 mmol/L (ref 3.5–5.1)
Sodium: 132 mmol/L — ABNORMAL LOW (ref 136–145)
Total Bilirubin: 0.7 MG/DL (ref 0.2–1.0)
Total Protein: 8 g/dL (ref 6.4–8.2)

## 2022-10-07 LAB — URINALYSIS WITH MICROSCOPIC
BACTERIA, URINE: NEGATIVE /hpf
Bilirubin, Urine: NEGATIVE
Blood, Urine: NEGATIVE
Glucose, Ur: >1000 mg/dL — AB
Ketones, Urine: 80 mg/dL — AB
Leukocyte Esterase, Urine: NEGATIVE
Nitrite, Urine: NEGATIVE
Protein, UA: 30 mg/dL — AB
Specific Gravity, UA: 1.025 (ref 1.003–1.030)
Urobilinogen, Urine: 1 EU/dL (ref 0.2–1.0)
pH, Urine: 5.5 (ref 5.0–8.0)

## 2022-10-07 LAB — CBC WITH AUTO DIFFERENTIAL
Basophils %: 1 % (ref 0–1)
Basophils Absolute: 0.1 10*3/uL (ref 0.0–0.1)
Eosinophils %: 1 % (ref 0–7)
Eosinophils Absolute: 0.1 10*3/uL (ref 0.0–0.4)
Hematocrit: 38.5 % (ref 35.0–47.0)
Hemoglobin: 12.3 g/dL (ref 11.5–16.0)
Immature Granulocytes %: 0 % (ref 0.0–0.5)
Immature Granulocytes Absolute: 0 10*3/uL (ref 0.00–0.04)
Lymphocytes %: 41 % (ref 12–49)
Lymphocytes Absolute: 3.7 10*3/uL — ABNORMAL HIGH (ref 0.8–3.5)
MCH: 25.8 PG — ABNORMAL LOW (ref 26.0–34.0)
MCHC: 31.9 g/dL (ref 30.0–36.5)
MCV: 80.9 FL (ref 80.0–99.0)
MPV: 11.8 FL (ref 8.9–12.9)
Monocytes %: 6 % (ref 5–13)
Monocytes Absolute: 0.5 10*3/uL (ref 0.0–1.0)
Neutrophils %: 51 % (ref 32–75)
Neutrophils Absolute: 4.6 10*3/uL (ref 1.8–8.0)
Nucleated RBCs: 0 PER 100 WBC
Platelets: 439 10*3/uL — ABNORMAL HIGH (ref 150–400)
RBC: 4.76 M/uL (ref 3.80–5.20)
RDW: 14.7 % — ABNORMAL HIGH (ref 11.5–14.5)
WBC: 9 10*3/uL (ref 3.6–11.0)
nRBC: 0 10*3/uL (ref 0.00–0.01)

## 2022-10-07 LAB — TROPONIN: Troponin, High Sensitivity: <4 ng/L (ref 0–51)

## 2022-10-07 LAB — EXTRA TUBES HOLD

## 2022-10-07 LAB — LIPASE: Lipase: 42 U/L (ref 13–75)

## 2022-10-07 LAB — LACTIC ACID: Lactic Acid, Plasma: 1.4 MMOL/L (ref 0.4–2.0)

## 2022-10-07 MED ORDER — KETOROLAC TROMETHAMINE 15 MG/ML IJ SOLN
15 | Freq: Once | INTRAMUSCULAR | Status: AC
Start: 2022-10-07 — End: 2022-10-07
  Administered 2022-10-07: 21:00:00 15 mg via INTRAVENOUS

## 2022-10-07 MED ORDER — POLYETHYLENE GLYCOL 3350 17 GM/SCOOP PO POWD
17 GM/SCOOP | Freq: Two times a day (BID) | ORAL | 1 refills | Status: AC | PRN
Start: 2022-10-07 — End: ?

## 2022-10-07 MED ORDER — FLEET ENEMA 7-19 GM/118ML RE ENEM
7-19118 GM/118ML | Freq: Once | RECTAL | 0 refills | Status: AC | PRN
Start: 2022-10-07 — End: 2022-10-07

## 2022-10-07 MED ORDER — BISACODYL 10 MG RE SUPP
10 MG | Freq: Every day | RECTAL | 0 refills | Status: AC
Start: 2022-10-07 — End: 2022-11-06

## 2022-10-07 MED ORDER — FREESTYLE LIBRE 2 SENSOR MISC
3 refills | Status: AC
Start: 2022-10-07 — End: 2023-01-23

## 2022-10-07 MED ORDER — POLYETHYLENE GLYCOL 3350 17 GM/SCOOP PO POWD
17 GM/SCOOP | Freq: Two times a day (BID) | ORAL | 1 refills | Status: DC | PRN
Start: 2022-10-07 — End: 2022-10-07

## 2022-10-07 MED ORDER — SENNA-DOCUSATE SODIUM 8.6-50 MG PO TABS
8.6-50 MG | ORAL_TABLET | Freq: Every day | ORAL | 0 refills | Status: AC
Start: 2022-10-07 — End: 2023-01-23

## 2022-10-07 MED FILL — KETOROLAC TROMETHAMINE 15 MG/ML IJ SOLN: 15 MG/ML | INTRAMUSCULAR | Qty: 1

## 2022-10-07 NOTE — ED Triage Notes (Signed)
Pt ambulatory to ED c/o centralized abdominal pain with rebound tenderness, N/V, and bloating x 1.5 weeks. Seen by PCP and told to come to ED for evaluation for Peritonitis. Significant GI history.

## 2022-10-07 NOTE — Assessment & Plan Note (Signed)
Uncontrolled, continue current medications and

## 2022-10-07 NOTE — Progress Notes (Signed)
SUBJECTIVES  with respective ASSESSMENT/PLAN:  [name] is a 42 y.o. female established patient who presents for Bloated (Pt reports to medical with abd pain and bloating.  + TTP epigastric / periumbilical for about a week. Has been taking tylenol on a regular. +nausea denies vomiting neg loose stool decrease in bowels.  Had a mag citrate a few days ago which helped some but not a lot.  Pt has hx of bowel obstruction. Has been taking zofran on occasion with some relief /)  , found to have the following:    1. 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    Going on about 1 week has been having worsening epigastric and upper abdominal pain associated with decreased regularity of bowel movements. Pain is worsening and is leading to nighttime awakenings. No blood in the stool able to pass gas. Some nausea, not associated with mealtime. Not as hungry. No fever/chills.  Assessment & Plan:  Positive peritoneal signs with rebound tenderness, obtunded abdomen. No other systemic symptoms. Discussed trial of bowel evacuation and KUB with labs, but given peritoneal signs and rebound I am concerned about a more acute process and think patient should be further evaluated in the emergency room.   Orders:  -     sodium phosphate (FLEET) 7-19 GM/118ML; Place 1 enema rectally once as needed (impaction not relieved with oral meds or suppository), Disp-133 mL, R-0Normal  -     bisacodyl (BISACODYL LAXATIVE) 10 MG suppository; Place 1 suppository rectally daily For use if not responding fully to Miralax plus senna tablet treatments after 1-2 days., Disp-30 suppository, R-0Normal  -     sennosides-docusate sodium (SENOKOT-S) 8.6-50 MG tablet; Take 1-2 tablets by mouth daily With miralax treatment., Disp-30 tablet, R-0Normal  -     polyethylene glycol (GLYCOLAX) 17 GM/SCOOP powder; Take 17 g by mouth 2 times daily as needed  (constipation), Disp-1530 g, R-1Normal  2. Generalized abdominal pain  3. Type 2 diabetes mellitus with diabetic neuropathy, with long-term current use of insulin (HCC)  Overview:  Hemoglobin A1C   Date Value Ref Range Status   09/02/2022 12.8 (H) 4.0 - 5.6 % Final     Comment:     (NOTE)  HbA1C Interpretive Ranges  <5.7              Normal  5.7 - 6.4         Consider Prediabetes  >6.5              Consider Diabetes       Medication: glargine 160U daily (2 divided doses), Lispro 10U TID, metformin 1000mg  BID.  Prev Meds: Jardiance (UTI/yeast infx)    Interval Hx:  - was on Libre 2 , tried changing to 3 and couldn't get, ran out of 2 sensors, didn't send for refill.  - last visit increased mealtime insulin from 6 to 10U TID; fasting glucose around 250-270, which is an improvement from 300s.      Assessment & Plan:   Uncontrolled, continue current medications and   Orders:  -     Continuous Glucose Sensor (FREESTYLE LIBRE 2 SENSOR) MISC; 1 each by Does not apply route every 14 days, Disp-6 each, R-3Normal  4. Essential hypertension  Overview:  BP Readings from Last 3 Encounters:   10/07/22 (!) 162/109   09/16/22 129/77   09/14/22 126/86     Medication: lisinopril 40mg  daily, metoprolol  succinate 25mg  daily.    Interval Hx:  - patient's blood pressure has been above goal a couple visits, was resistant to changes, working on lifestyle further. But upcoming surgery.  - asymptomatic today.        It was a pleasure seeing [name] today.    No follow-ups on file.      The following portions of the patient's history were reviewed and updated as appropriate: allergies, current medications, family history, medical history, social history, surgical history and problem list.    OBJECTIVE FINDINGS:    BP (!) 162/109   Pulse (!) 102   Temp 98.2 F (36.8 C)   Resp 20   Ht 1.575 m (5\' 2" )   Wt 112.5 kg (248 lb)   SpO2 97%   BMI 45.36 kg/m     Physical exam:  Constitutional: well-appearing, no acute distress  Eyes:  EOM intact. PERRL bilateral. Not icteric.  Cardiac: normal rate, regular rhythm.  Pulm: normal rate, normal effort.  Skin: normal color and turgor.  Lower extrem: no edema.  Abd: distended abdomen, quit sounds, generalized tenderness, worsened in the epigastric area and has significant rebound tenderness. Became nauseated during exam.    Note: relevant labs reviewed and noted in the Overview section above.          An electronic signature was used to authenticate this note.     Portions of this note may have been populated using smart dictation software and may have "sounds-like" errors present.     Pt was counseled on risks, benefits and alternatives of treatment options. All questions were asked and answered and the patient was agreeable with the treatment plan as outlined.    Langley Gauss, MD  [facility]  [phone]
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
## 2022-10-07 NOTE — Progress Notes (Signed)
Chief Complaint   Patient presents with    Bloated     Pt reports to medical with abd pain and bloating.  + TTP epigastric / periumbilical for about a week. Has been taking tylenol on a regular. +nausea denies vomiting neg loose stool decrease in bowels.  Had a mag citrate a few days ago which helped some but not a lot.  Pt has hx of bowel obstruction. Has been taking zofran on occasion with some relief

## 2022-10-07 NOTE — Assessment & Plan Note (Addendum)
Positive peritoneal signs with rebound tenderness, obtunded abdomen. No other systemic symptoms. Discussed trial of bowel evacuation and KUB with labs, but given peritoneal signs and rebound I am concerned about a more acute process and think patient should be further evaluated in the emergency room.

## 2022-10-07 NOTE — ED Notes (Signed)
DC paperwork reviewed, pt verbalized understanding, IV removed, pt ambulatory at time of DC, no distress noted.

## 2022-10-08 LAB — EKG 12-LEAD
Atrial Rate: 88 {beats}/min
Diagnosis: NORMAL
P Axis: 40 degrees
P-R Interval: 156 ms
Q-T Interval: 392 ms
QRS Duration: 82 ms
QTc Calculation (Bazett): 474 ms
R Axis: 96 degrees
T Axis: 31 degrees
Ventricular Rate: 88 {beats}/min

## 2022-10-12 NOTE — Telephone Encounter (Signed)
Osvaldo Angst (Key: [id number])  FreeStyle Libre 2 Sensor  Status: Sent To PlanCreated: June 26th, 2024Sent: June 26th, 2024      SALINA BLYTHER [facility]: [id number])  Bisacodyl 10MG  suppositories  Status: Sent To PlanCreated: June 26th, 2024Sent: June 26th, 2024

## 2022-10-12 NOTE — Telephone Encounter (Signed)
Pt called to check on status of prior authorization needed for [facility] and suppositories prescribed by Dr. Jayme Cloud.     Pt notes changing insurance to [place] recently and used her last sensor today. Ldm

## 2022-10-13 NOTE — Telephone Encounter (Signed)
Freestyle Libre 2 Sensors have been approved.   10/13/2022-10/12/2023      Bisacodyl is on the preferred drug list and no pre-authorization is required.       Pt has been advised.

## 2023-01-04 NOTE — Telephone Encounter (Addendum)
 Pt called asking to be seen for appointment with her son at 3 pm with Dr. Tamea, stating she thinks she has Peritonitis again and doesn't want to go back to the hospital for this.    Pt advised no appointments available with Dr. Tamea at this time and may need to return to ER or urgent care if symptoms are severe but will call if cancellation available.  Pt agrees to plan. Ldm

## 2023-01-15 LAB — BASIC METABOLIC PANEL
BUN/Creatinine Ratio: 13 (ref 9–23)
BUN: 8 mg/dL (ref 6–24)
CO2: 24 mmol/L (ref 20–29)
Chloride: 100 mmol/L (ref 96–106)
Creatinine: 0.6 mg/dL (ref 0.57–1.00)
Est, Glom Filt Rate: 114 mL/min/{1.73_m2} (ref >59)
Glucose: 257 mg/dL — ABNORMAL HIGH (ref 70–99)
Potassium: 4.6 mmol/L (ref 3.5–5.2)
Sodium: 138 mmol/L (ref 134–144)

## 2023-01-23 ENCOUNTER — Ambulatory Visit: Admit: 2023-01-23 | Discharge: 2023-01-23 | Payer: MEDICAID | Attending: Family Medicine | Primary: Family Medicine

## 2023-01-23 DIAGNOSIS — R771 Abnormality of globulin: Secondary | ICD-10-CM

## 2023-01-23 MED ORDER — GLIPIZIDE ER 2.5 MG PO TB24
2.5 MG | ORAL_TABLET | Freq: Every day | ORAL | 3 refills | Status: DC
Start: 2023-01-23 — End: 2023-06-01

## 2023-01-23 MED ORDER — SENNA-DOCUSATE SODIUM 8.6-50 MG PO TABS
ORAL_TABLET | Freq: Every day | ORAL | 0 refills | Status: AC
Start: 2023-01-23 — End: ?

## 2023-01-23 MED ORDER — FREESTYLE LIBRE 2 SENSOR MISC
3 refills | Status: DC
Start: 2023-01-23 — End: 2023-06-01

## 2023-01-23 NOTE — Patient Instructions (Addendum)
[email or web address]    ---------------------------------------------------  Keywords and Lifestyle Strategies to consider and look up:    ACTIVITY:  General Movement is important to burn calories, keep your metabolism and mitochondria moving and functioning. Our bodies are built to be moving no less than 10,000 steps per day, and really we should strive for 20,000 steps per day. At the end of the day we should be able to say that we have been moving more than sitting or standing still.    Cardio keeps the heart strong, 30 minutes of moderate activity 5 days per week is the goal.    Strength and muscle building burns fats even on rest/recover days, and can even help with healthy physiologic testosterone production (men and women) to make it easier to regulate your metabolism.    High Intensity Interval Training or H-I-I-T; a method of exercising more intensely with a cominbination of muscle/strength techniques and cardio for 15-20 minutes 3 days per week.    FOODS:  LOW CARBOHYDRATE. HIGH FIBER. HEALTHY FATS. ANTIOXIDANT RICH.    Low carbohydrates:  Carbohydrates are a luxury energy source, quick burning, quick storage, inflammation promoting.    Carbs include simple sugars found in soda pop, sweet tea, juices, and desserts. Other carbohydrate sources that lead to trouble is found in white rice, pasta, noodles, white bread, white potatoes.    But sometimes we should eat some carbs, but they need to be high fiber. In fact, in nature carbohydrates are never found without fiber or protein, but modern food processing has removed those important ingredients, separating things that never should have been separated.     Some examples of healthy grains/carbs include spelt, [place] wheat ([place]), einkorn, and emmer; the grains millet, barley, teff, oats, and sorghum; and the pseudocereals quinoa, amaranth, buckwheat, and chia, flaxseed. Sprouted grain breads are good for you as well.    Carbohydrates are also designed for  high energy-demanding activities such as running, using our muscles, dancing, etc. And when we only eat them for these situations, it's called Carbohydrate cycling (you reserve carbohydrate consumption primarily for workout days, with overall carb decrease for the entire week). Moving the remaining carb intake primarily to workout days maximizes your physical energy, provides an incentive for workouts, and cycles your metabolism to a more fat-burning mode on your off-days.    Another way to help cycle and "reset" your metabolism is to do intermittent fasting. It is acceptable and helpful (16, 24, or 36 hours in a row) for "reseting" your metabolism by allowing it to temporarily cycle to a fat-burning mode; you must make sure to drink lots of water and electrolytes (sugar-free), and that first meal after the fasting must be low carb. Consider looking into Harrison Mons MD (http://osborne-frye.[email or web address]).    Healthy Fats:  Fried oils, in particular fried polyunsaturated vegetable oils used in frying different fried food has been shown to increase inflammation and may be contributing a lot to cholesterol problems in many adults. This fats become oxidized, which makes them more prone to cause inflammation and injury to your cells and artery walls.  Mediterranean Diet is well balanced with lean meats, healthy monounsaturated fats and nonoxidized unsaturated fats, high fiber carbs that your body needs.  (See education handout)    [employer] is agreat place to go and learn about good olive oil:  [address], [place], [place] [address]     Fiber:  High Fiber with water increase helps regulate gut microbiome and  intestinal health, which boosts the metabolism.  (See education handout)    Probiotics:  A healthy gut needs good tenants. If looking to increase your gut microbe diversity, consider temporary or intermittent supplementation with multiple natural gut bacteria. They are still  researching what species perform which functions in the gut, but generally the more variety, the healthier the microbiome. Below are two probiotics that I consider to have the most species variety.    Economic and effective: Nature's Bounty Probiotic 10  More expensive but ideal: Seed DS-01 Daily Synbiotic - Prebiotic and Probiotic     Also, see fermented food lists below, which work as natural probiotics.    Fighting Mitochondrial Damage Caused by Oxidation and Inflammation:    Mitochondria are the engines of our cells, responsible for properly managing all of our metabolic energy processes. They are especially abundant in our nerve, muscle, fat, and liver cells.    Work to eat more antioxidant-rich foods to support our mitochondria! Metabolic disorders are partly due to chronic oxidative stress/damage to cells and mitochondria dysregulation.    Fruits and vegetables are great sources of antioxidants, especially blueberries, cranberries, raspberries, strawberries, prunes, black beans, dark chocolate, apples, kale, broccoli. Also, fermented foods such as kefir, tempeh, natto, kombucha, miso, kimchi, sauerkraut, probiotic yogurt, cottage cheese, apple cider vinegar, carry antioxidant and gut-health properties.    In fact, some supplements have been shown in animal studies and in preliminary human studies to take the brake off of metabolic syndrome by supporting the function of the mitochondria specifically.   These include the following:  Coenzyme Q10 (Ubiquinone or Ubiquinol) 200mg  twice a day, Alpha Lipoic Acid 300-600mg  daily, Magnesium glycinate or bisglycinate 400mg  daily, Vitamin C 500-1000mg  daily, Vitamin D3 800-1000mg  daily, and Vitamin E 400-800 IU daily    But, be careful of upset stomach, and rare allergic reactions. Also the price of them vary, as do the purity and effectiveness/absorption.    Some generally considered reputable supplement brands include Jacinto Reap, Live Good, Pure Encapsulations, Doctor's  Best and NOW.      Sleep, Breathing, and the Great Outdoors:  Sleep is needed to allow for the metabolism to repair properly after a tough day.    Work on sleep hygiene (visit [employer] website on sleep hygiene for more information: https://www.sleepfoundation.org/sleep-hygiene). Better sleep means better repair.    Nose-breathing has also been shown to increase Nitric Oxide which is made in our sinuses.     Getting outside regularly, getting some sunlight exposure, touching the grass, breathing fresh air, getting near running water, listening to the birds. These have real physiologic health benefits, and numerous mental health benefits. Working on mental health drastically improves oxidative stress and inflammation problems.    A Comment about Ultra-Processed Foods:  Below is a link to a video that describes what ultra processed foods is.    5 ways to identify ultra processed foods with Timmothy Euler.  https://youtu.be/[email or web address]_[email or web address]      Environmental Toxins and Endocrine Disruptor Molecules:  - consider cutting back/cutting out different hygiene, detergents, make-ups, and food container products that contain molecules that have been found to worsen hormone and metabolism regulation and mitochondrial function including Parabens, Phthalates, PFAS, BPA.   - look to see if you have any signs of exposures to heavy metals (lead, mercury, arsenic, etc). These can damage the metabolism.    BELOW ARE SOME EXAMPLE REGIMENS UTILIZING CONCEPTS FROM THE DIET AND EXERCISE REQUIREMENTS ABOVE:    ### Meal Plan  Focus on a heart-healthy diet, rich in fruits, vegetables, whole grains, lean proteins, and healthy fats. Limit sodium, saturated fats, and sugars.    #### Daily Meal Structure  - **Breakfast:** High-fiber, moderate protein, low sugar  - **Lunch:** Balanced mix of veggies, lean protein, and whole grains  - **Dinner:** Light on carbs, rich in vegetables and lean protein  - **Snacks:**  Fruits, nuts, yogurt, or vegetables    #### Weekly Menu    **Monday**  - **Breakfast:** Oatmeal with berries and a sprinkle of chia seeds  - **Lunch:** Grilled chicken salad with mixed greens, tomatoes, cucumbers, and a light vinaigrette  - **Dinner:** Baked salmon with steamed broccoli and quinoa  - **Snacks:** Apple slices with almond butter, carrot sticks    **Tuesday**  - **Breakfast:** [place] yogurt with honey and sliced almonds  - **Lunch:** Quinoa bowl with black beans, corn, avocado, and salsa  - **Dinner:** [place] meatloaf with green beans and mashed cauliflower  - **Snacks:** Mixed nuts, celery sticks with hummus    **Wednesday**  - **Breakfast:** Smoothie with spinach, banana, berries, and protein powder  - **Lunch:** Lentil soup with a side salad  - **Dinner:** Stir-fried tofu with mixed vegetables and brown rice  - **Snacks:** Cottage cheese with pineapple, cherry tomatoes    **Thursday**  - **Breakfast:** Whole grain toast with avocado and poached egg  - **Lunch:** Chickpea salad with cucumbers, tomatoes, olives, and feta  - **Dinner:** Baked chicken breast with asparagus and sweet potato  - **Snacks:** Sliced bell peppers, low-fat cheese stick    **Friday**  - **Breakfast:** Scrambled eggs with spinach and whole grain toast  - **Lunch:** Whole grain wrap with [place], avocado, lettuce, and tomato  - **Dinner:** Shrimp stir-fry with broccoli and snap peas over quinoa  - **Snacks:** Orange slices, handful of walnuts    **Saturday**  - **Breakfast:** Smoothie bowl with blended berries, banana, and topped with granola  - **Lunch:** Grilled vegetable and hummus pita  - **Dinner:** Grilled lean steak with roasted Brussels sprouts and a small baked potato  - **Snacks:** Fresh berries, cucumber slices with tzatziki    **Sunday**  - **Breakfast:** Steel-cut oats with sliced bananas and a sprinkle of flaxseeds  - **Lunch:** Spinach and mushroom frittata with a side salad  - **Dinner:** Baked cod with roasted  carrots and wild rice  - **Snacks:** Pear slices, air-popped popcorn    ### Exercise Regimen    Incorporate both cardiovascular and strength training exercises to improve heart health and overall fitness.    #### Weekly Schedule    **Monday**  - **Cardio:** 30 minutes of brisk walking or stationary biking  - Scientific laboratory technician:** Bodyweight exercises (squats, lunges, push-ups, planks) for 20 minutes    **Tuesday**  - **Cardio:** 30 minutes of swimming or water aerobics  - **Flexibility:** 20 minutes of yoga or stretching    **Wednesday**  - **Cardio:** 30 minutes of brisk walking or elliptical  - **Strength Training:** Light weights (bicep curls, tricep dips, shoulder press) for 20 minutes    **Thursday**  - **Cardio:** 30 minutes of dancing or aerobics class  - **Flexibility:** 20 minutes of Pilates or stretching    **Friday**  - **Cardio:** 30 minutes of cycling or fast-paced walking  - Scientific laboratory technician:** Bodyweight exercises for 20 minutes    **Saturday**  - **Cardio:** 30 minutes of hiking or a long walk  - **Flexibility:** 20 minutes of yoga or stretching    **Sunday**  - **Rest Day:** Light stretching or a leisurely walk    ###  Tips for Staying on Track    1. **Preparation:** Spend time on Sunday to meal prep for the week. Pre-cut vegetables, portion out snacks, and plan your meals.  2. **Tracking:** Use a simple app or a journal to log meals and exercise. Set reminders on your phone for meal and workout times.  3. **Hydration:** Drink plenty of water throughout the day to stay hydrated.  4. **Commute Exercise:** Consider using part of your commute for exercise if possible, such as parking further away or taking public transport part of the way and walking the rest.  5. **Support:** Find a workout buddy or join a class to stay motivated and accountable.    ### Important Notes    - **Consult Your Doctor:** Before starting any new diet or exercise regimen, it's crucial to consult with your healthcare  provider to ensure it's safe and appropriate for your specific health conditions.  - **Progress Gradually:** Start with lighter activities and gradually increase intensity as you build strength and endurance.    This plan aims to balance nutrition and physical activity while accommodating your busy schedule.

## 2023-01-23 NOTE — Progress Notes (Signed)
Chief Complaint   Patient presents with    Follow-up Chronic Condition     HTN and DM   A1C- 11/2022 was 13.9     "Have you been to the ER, urgent care clinic since your last visit?  Hospitalized since your last visit?"    YES - When: approximately 1 months ago.  Where and Why: [facility].    "Have you seen or consulted any other health care providers outside of [facility] since your last visit?"    NO            Click Here for Release of Records Request
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
## 2023-03-08 ENCOUNTER — Inpatient Hospital Stay
Admit: 2023-03-08 | Discharge: 2023-03-08 | Disposition: A | Discharge status: Home / Self Care | Payer: MEDICAID | Admitting: Emergency Medicine

## 2023-03-08 DIAGNOSIS — R109 Unspecified abdominal pain: Secondary | ICD-10-CM

## 2023-03-08 LAB — CBC WITH AUTO DIFFERENTIAL
Basophils %: 1 % (ref 0–1)
Basophils Absolute: 0.1 10*3/uL (ref 0.0–0.1)
Eosinophils %: 2 % (ref 0–7)
Eosinophils Absolute: 0.2 10*3/uL (ref 0.0–0.4)
Hematocrit: 37.6 % (ref 35.0–47.0)
Hemoglobin: 12.5 g/dL (ref 11.5–16.0)
Immature Granulocytes %: 0 % (ref 0–0.5)
Immature Granulocytes Absolute: 0 10*3/uL (ref 0.00–0.04)
Lymphocytes %: 35 % (ref 12–49)
Lymphocytes Absolute: 3.4 10*3/uL (ref 0.8–3.5)
MCH: 26.5 pg (ref 26.0–34.0)
MCHC: 33.2 g/dL (ref 30.0–36.5)
MCV: 79.7 fL — ABNORMAL LOW (ref 80.0–99.0)
MPV: 12.1 fL (ref 8.9–12.9)
Monocytes %: 7 % (ref 5–13)
Monocytes Absolute: 0.7 10*3/uL (ref 0.0–1.0)
Neutrophils %: 55 % (ref 32–75)
Neutrophils Absolute: 5.5 10*3/uL (ref 1.8–8.0)
Nucleated RBCs: 0 /100{WBCs}
Platelets: 364 10*3/uL (ref 150–400)
RBC: 4.72 M/uL (ref 3.80–5.20)
RDW: 13.9 % (ref 11.5–14.5)
WBC: 9.8 10*3/uL (ref 3.6–11.0)
nRBC: 0 10*3/uL (ref 0.00–0.01)

## 2023-03-08 LAB — URINALYSIS WITH REFLEX TO CULTURE
Bilirubin, Urine: NEGATIVE
Blood, Urine: NEGATIVE
Glucose, Ur: >1000 mg/dL — AB
Ketones, Urine: NEGATIVE mg/dL
Leukocyte Esterase, Urine: NEGATIVE
Nitrite, Urine: NEGATIVE
Protein, UA: NEGATIVE mg/dL
Specific Gravity, UA: 1.015 (ref 1.003–1.030)
Urobilinogen, Urine: 0.2 U/dL (ref 0.2–1.0)
pH, Urine: 5.5 (ref 5.0–8.0)

## 2023-03-08 LAB — COMPREHENSIVE METABOLIC PANEL
ALT: 19 U/L (ref 12–78)
AST: 7 U/L — ABNORMAL LOW (ref 15–37)
Albumin/Globulin Ratio: 0.8 — ABNORMAL LOW (ref 1.1–2.2)
Albumin: 3.4 g/dL — ABNORMAL LOW (ref 3.5–5.0)
Alk Phosphatase: 74 U/L (ref 45–117)
Anion Gap: 10 mmol/L (ref 2–12)
BUN/Creatinine Ratio: 12 (ref 12–20)
BUN: 11 mg/dL (ref 6–20)
CO2: 26 mmol/L (ref 21–32)
Calcium: 9.2 mg/dL (ref 8.5–10.1)
Chloride: 97 mmol/L (ref 97–108)
Creatinine: 0.91 mg/dL (ref 0.55–1.02)
Est, Glom Filt Rate: 80 mL/min/{1.73_m2} (ref >60)
Globulin: 4.3 g/dL — ABNORMAL HIGH (ref 2.0–4.0)
Glucose: 375 mg/dL — ABNORMAL HIGH (ref 65–100)
Potassium: 3.9 mmol/L (ref 3.5–5.1)
Sodium: 133 mmol/L — ABNORMAL LOW (ref 136–145)
Total Bilirubin: 0.6 mg/dL (ref 0.2–1.0)
Total Protein: 7.7 g/dL (ref 6.4–8.2)

## 2023-03-08 LAB — LIPASE: Lipase: 64 U/L (ref 13–75)

## 2023-03-08 MED ORDER — ONDANSETRON 4 MG PO TBDP
4 | ORAL_TABLET | Freq: Three times a day (TID) | ORAL | 0 refills | Status: AC | PRN
Start: 2023-03-08 — End: ?

## 2023-03-08 MED ORDER — ONDANSETRON HCL 4 MG/2ML IJ SOLN
4 | Freq: Once | INTRAMUSCULAR | Status: AC
Start: 2023-03-08 — End: 2023-03-08
  Administered 2023-03-08: 11:00:00 4 mg via INTRAVENOUS

## 2023-03-08 MED FILL — ONDANSETRON HCL 4 MG/2ML IJ SOLN: 4 MG/2ML | INTRAMUSCULAR | Qty: 2

## 2023-03-08 NOTE — ED Triage Notes (Signed)
 Pt ambulates slowly to treatment area due to pain.  Pt states that for 6 days she has been dealing with abdominal pain, bloating and nausea.  Pt states that her GI dr told her to do something similar to bowel prep and so she did and she states that she sta

## 2023-03-08 NOTE — ED Notes (Signed)
 Blood specimens obtained patient tolerated well. Updated on plan of care, family at bedside, warm blankets provided for comfort.

## 2023-03-08 NOTE — Discharge Instructions (Addendum)
 Your CT scan did not show any abnormalities today.  Please follow-up with your PCP for symptom recheck.  Thank you.

## 2023-03-08 NOTE — ED Provider Notes (Signed)
 [facility] EMERGENCY DEPT  EMERGENCY DEPARTMENT ENCOUNTER      Pt Name: Alyssa [name]  MRN: [id number]  Birthdate May 22, 1980  Date of evaluation: 03/08/2023  Provider: Julieanne Manson, MD    CHIEF COMPLAINT       Chief Complaint   Patient presents with    Abd

## 2023-03-09 ENCOUNTER — Ambulatory Visit
Admit: 2023-03-09 | Discharge: 2023-03-09 | Payer: Medicaid (Managed Care) | Attending: Student in an Organized Health Care Education/Training Program | Admitting: Student in an Organized Health Care Education/Training Program | Primary: Family Medicine

## 2023-03-09 ENCOUNTER — Inpatient Hospital Stay
Admit: 2023-03-09 | Discharge: 2023-03-09 | Disposition: A | Discharge status: Home / Self Care | Payer: MEDICAID | Admitting: Emergency Medicine

## 2023-03-09 VITALS — BP 151/103 | HR 103 | Temp 97.20000°F | Ht 62.0 in | Wt 248.0 lb

## 2023-03-09 DIAGNOSIS — R1013 Epigastric pain: Secondary | ICD-10-CM

## 2023-03-09 LAB — CBC WITH AUTO DIFFERENTIAL
Basophils %: 1 % (ref 0–1)
Basophils Absolute: 0.1 10*3/uL (ref 0.0–0.1)
Eosinophils %: 1 % (ref 0–7)
Eosinophils Absolute: 0.1 10*3/uL (ref 0.0–0.4)
Hematocrit: 40 % (ref 35.0–47.0)
Hemoglobin: 13 g/dL (ref 11.5–16.0)
Immature Granulocytes %: 0 % (ref 0.0–0.5)
Immature Granulocytes Absolute: 0 10*3/uL (ref 0.00–0.04)
Lymphocytes %: 36 % (ref 12–49)
Lymphocytes Absolute: 3.6 10*3/uL — ABNORMAL HIGH (ref 0.8–3.5)
MCH: 26.1 pg (ref 26.0–34.0)
MCHC: 32.5 g/dL (ref 30.0–36.5)
MCV: 80.2 fL (ref 80.0–99.0)
MPV: 12.3 fL (ref 8.9–12.9)
Monocytes %: 6 % (ref 5–13)
Monocytes Absolute: 0.6 10*3/uL (ref 0.0–1.0)
Neutrophils %: 56 % (ref 32–75)
Neutrophils Absolute: 5.5 10*3/uL (ref 1.8–8.0)
Nucleated RBCs: 0 /100{WBCs}
Platelets: 402 10*3/uL — ABNORMAL HIGH (ref 150–400)
RBC: 4.99 M/uL (ref 3.80–5.20)
RDW: 13.9 % (ref 11.5–14.5)
WBC: 9.8 10*3/uL (ref 3.6–11.0)
nRBC: 0 10*3/uL (ref 0.00–0.01)

## 2023-03-09 LAB — COMPREHENSIVE METABOLIC PANEL
ALT: 19 U/L (ref 12–78)
AST: 15 U/L (ref 15–37)
Albumin/Globulin Ratio: 0.8 — ABNORMAL LOW (ref 1.1–2.2)
Albumin: 3.6 g/dL (ref 3.5–5.0)
Alk Phosphatase: 73 U/L (ref 45–117)
Anion Gap: 11 mmol/L (ref 2–12)
BUN/Creatinine Ratio: 12 (ref 12–20)
BUN: 10 mg/dL (ref 6–20)
CO2: 25 mmol/L (ref 21–32)
Calcium: 9.9 mg/dL (ref 8.5–10.1)
Chloride: 98 mmol/L (ref 97–108)
Creatinine: 0.84 mg/dL (ref 0.55–1.02)
Est, Glom Filt Rate: 88 mL/min/{1.73_m2} (ref >60)
Globulin: 4.7 g/dL — ABNORMAL HIGH (ref 2.0–4.0)
Glucose: 294 mg/dL — ABNORMAL HIGH (ref 65–100)
Potassium: 4.2 mmol/L (ref 3.5–5.1)
Sodium: 134 mmol/L — ABNORMAL LOW (ref 136–145)
Total Bilirubin: 0.7 mg/dL (ref 0.2–1.0)
Total Protein: 8.3 g/dL — ABNORMAL HIGH (ref 6.4–8.2)

## 2023-03-09 LAB — TROPONIN: Troponin, High Sensitivity: <4 ng/L (ref 0–51)

## 2023-03-09 LAB — EKG 12-LEAD
Atrial Rate: 97 {beats}/min
Diagnosis: NORMAL
P Axis: 52 degrees
P-R Interval: 148 ms
Q-T Interval: 352 ms
QRS Duration: 80 ms
QTc Calculation (Bazett): 447 ms
R Axis: 113 degrees
T Axis: 51 degrees
Ventricular Rate: 97 {beats}/min

## 2023-03-09 LAB — URINALYSIS WITH MICROSCOPIC
Bilirubin, Urine: NEGATIVE
Blood, Urine: NEGATIVE
Glucose, Ur: >1000 mg/dL — AB
Leukocyte Esterase, Urine: NEGATIVE
Nitrite, Urine: NEGATIVE
Protein, UA: NEGATIVE mg/dL
Specific Gravity, UA: 1.02 (ref 1.003–1.030)
Urobilinogen, Urine: 0.2 U/dL (ref 0.2–1.0)
pH, Urine: 5.5 (ref 5.0–8.0)

## 2023-03-09 LAB — LIPASE: Lipase: 54 U/L (ref 13–75)

## 2023-03-09 LAB — LACTIC ACID: Lactic Acid, Plasma: 1.9 MMOL/L (ref 0.4–2.0)

## 2023-03-09 MED ORDER — PROMETHAZINE HCL 25 MG/ML IJ SOLN
25 | Freq: Once | INTRAMUSCULAR | Status: DC
Start: 2023-03-09 — End: 2023-03-09

## 2023-03-09 MED ORDER — SUCRALFATE 1 G PO TABS
1 | ORAL_TABLET | Freq: Four times a day (QID) | ORAL | 3 refills | Status: AC
Start: 2023-03-09 — End: ?

## 2023-03-09 MED ORDER — DICYCLOMINE HCL 20 MG PO TABS
20 | ORAL_TABLET | Freq: Four times a day (QID) | ORAL | 0 refills | Status: AC
Start: 2023-03-09 — End: ?

## 2023-03-09 MED ORDER — OXYCODONE-ACETAMINOPHEN 5-325 MG PO TABS
5-325 | ORAL | Status: AC
Start: 2023-03-09 — End: 2023-03-09
  Administered 2023-03-09: 22:00:00 1 via ORAL

## 2023-03-09 MED ORDER — PANTOPRAZOLE SODIUM 40 MG PO TBEC
40 | ORAL_TABLET | Freq: Every day | ORAL | 0 refills | Status: DC
Start: 2023-03-09 — End: 2024-02-19

## 2023-03-09 MED ORDER — DROPERIDOL 2.5 MG/ML IJ SOLN
2.5 | Freq: Once | INTRAMUSCULAR | Status: DC
Start: 2023-03-09 — End: 2023-03-09

## 2023-03-09 MED ORDER — ALUM & MAG HYDROXIDE-SIMETH 200-200-20 MG/5ML PO SUSP
200-200-20 | Freq: Once | ORAL | Status: AC
Start: 2023-03-09 — End: 2023-03-09
  Administered 2023-03-09: 22:00:00 40 mL via ORAL

## 2023-03-09 MED FILL — OXYCODONE-ACETAMINOPHEN 5-325 MG PO TABS: 5-325 MG | ORAL | Qty: 1

## 2023-03-09 MED FILL — MAG-AL PLUS 200-200-20 MG/5ML PO LIQD: 200-200-20 MG/5ML | ORAL | Qty: 30

## 2023-03-09 NOTE — ED Notes (Signed)
 Patient does not appear to be in any acute distress/shows no evidence of clinical instability at this time.     Provider has reviewed discharge instructions with the patient/family.  The patient/family verbalized understanding instructions as well as need

## 2023-03-09 NOTE — Progress Notes (Signed)
 Chief Complaint   Patient presents with    Follow-Up from Hospital    Abdominal Pain     "Have you been to the ER, urgent care clinic since your last visit?  Hospitalized since your last visit?"    NO    "Have you seen or consulted any other health care pr

## 2023-03-09 NOTE — Discharge Instructions (Addendum)
 Take medications as prescribed.  Take Protonix daily.  Take Carafate 4 times daily for symptom relief.  Take dicyclomine to help with abdominal cramping.  Please follow-up with your GI specialist.  If you develop new or worsening symptoms return to the ER.

## 2023-03-09 NOTE — ED Provider Notes (Signed)
 ED SIGN OUT NOTE  Care assumed at [facility]. [facility] 4:15 PM EST    Patient was signed out to me by Nelwyn Salisbury NP.     Patient is awaiting abdominal ultrasound. Patient with epigastric pain. Seen yesterday and had negative workup including CT

## 2023-03-09 NOTE — ED Triage Notes (Signed)
 Patient to ER for complaints of abdominal pain x 1 week.     Endorses N/V.     Hx of DM.    Olegario Messier NP in triage to assess patient.

## 2023-03-09 NOTE — Progress Notes (Addendum)
 Family Medicine Office Visit  Patient: Alyssa [name]  1980-07-07, 42 y.o., female  Encounter Date: 03/09/2023      CHIEF COMPLAINT  Chief Complaint   Patient presents with    Follow-Up from Hospital    Abdominal Pain       SUBJECTIVE  Alyssa [name] is a 55

## 2023-03-09 NOTE — ED Provider Notes (Signed)
 [facility] EMERGENCY DEPT  EMERGENCY DEPARTMENT ENCOUNTER      Pt Name: Alyssa [name]  MRN: [id number]  Birthdate [birth year]  Date of evaluation: 03/09/2023  Provider: Nelwyn Salisbury, APRN - NP      HISTORY OF PRESENT ILLNESS      Chief Complaint:  Anda Latina

## 2023-04-03 ENCOUNTER — Encounter: Admit: 2023-04-03 | Admitting: Family Medicine

## 2023-04-03 DIAGNOSIS — R0681 Apnea, not elsewhere classified: Secondary | ICD-10-CM

## 2023-05-04 ENCOUNTER — Encounter

## 2023-05-04 ENCOUNTER — Ambulatory Visit
Admit: 2023-05-04 | Discharge: 2023-05-04 | Payer: MEDICAID | Attending: Student in an Organized Health Care Education/Training Program | Primary: Family Medicine

## 2023-05-04 VITALS — BP 147/99 | HR 85 | Temp 98.40000°F | Resp 18 | Ht 62.0 in | Wt 235.0 lb

## 2023-05-04 DIAGNOSIS — J0110 Acute frontal sinusitis, unspecified: Secondary | ICD-10-CM

## 2023-05-04 MED ORDER — FLUTICASONE PROPIONATE 50 MCG/ACT NA SUSP
50 | Freq: Two times a day (BID) | NASAL | 1 refills | Status: AC
Start: 2023-05-04 — End: ?

## 2023-05-04 MED ORDER — AMOXICILLIN-POT CLAVULANATE 875-125 MG PO TABS
875-125 | ORAL_TABLET | Freq: Two times a day (BID) | ORAL | 0 refills | Status: AC
Start: 2023-05-04 — End: 2023-05-14
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
## 2023-05-04 NOTE — Assessment & Plan Note (Addendum)
Given symptom onset and worsening character, will treat with antibiotics.  Advised to take probiotics and eat plain yogurt to prevent diarrhea/yeast infection.  Recommended nasal rinses with normal saline 3 times daily  Recommend Flonase nasal spray twice daily both nostrils    Orders:    fluticasone (FLONASE) 50 MCG/ACT nasal spray; 1 spray by Each Nostril route in the morning and at bedtime    amoxicillin-clavulanate (AUGMENTIN) 875-125 MG per tablet; Take 1 tablet by mouth 2 times daily for 10 days

## 2023-05-04 NOTE — Assessment & Plan Note (Addendum)
Blood pressure in office today 147/99, patient asymptomatic.  Admitted to not having taken her blood pressure medication this morning.  Reports medication compliance.      Return to clinic if symptoms worsen.  Follow-up with PCP as scheduled on 05/01/2023

## 2023-05-04 NOTE — Progress Notes (Signed)
Chief Complaint   Patient presents with    Sinus Problem     Alyssa [name] is a 43 y.o. female who presents for an acute visit.   Onset 1/08 Wedn.  Symptoms: runny/head congestion/voice abnormal/irregular mucus production/more than usual. Slight nausea.       "Have you been to the ER, urgent care clinic since your last visit?  Hospitalized since your last visit?"    NO  Yes Sat 04/29/23: [facility] (back strain while cleaning car) gave her Robaxin.    "Have you seen or consulted any other health care providers outside of [facility] since your last visit?"    NO            Click Here for Release of Records Request

## 2023-05-05 LAB — LIPID PANEL
Chol/HDL Ratio: 2.3 (ref 0.0–5.0)
Cholesterol, Total: 120 mg/dL (ref <200)
HDL: 52 mg/dL
LDL Cholesterol: 36 mg/dL (ref 0–100)
Triglycerides: 160 mg/dL — ABNORMAL HIGH (ref <150)
VLDL Cholesterol Calculated: 32 mg/dL

## 2023-05-05 LAB — URINALYSIS WITH REFLEX TO CULTURE
Bilirubin, Urine: NEGATIVE
Blood, Urine: NEGATIVE
Glucose, Ur: >1000 mg/dL — AB
Ketones, Urine: NEGATIVE mg/dL
Leukocyte Esterase, Urine: NEGATIVE
Nitrite, Urine: NEGATIVE
Protein, UA: NEGATIVE mg/dL
Specific Gravity, UA: 1.02 (ref 1.003–1.030)
Urobilinogen, Urine: 0.2 U/dL (ref 0.2–1.0)
pH, Urine: 5.5 (ref 5.0–8.0)

## 2023-05-05 LAB — CBC
Hematocrit: 40 % (ref 35.0–47.0)
Hemoglobin: 12.6 g/dL (ref 11.5–16.0)
MCH: 25.7 pg — ABNORMAL LOW (ref 26.0–34.0)
MCHC: 31.5 g/dL (ref 30.0–36.5)
MCV: 81.6 fL (ref 80.0–99.0)
MPV: 12.6 fL (ref 8.9–12.9)
Nucleated RBCs: 0 /100{WBCs}
Platelets: 401 10*3/uL — ABNORMAL HIGH (ref 150–400)
RBC: 4.9 M/uL (ref 3.80–5.20)
RDW: 13.4 % (ref 11.5–14.5)
WBC: 6.8 10*3/uL (ref 3.6–11.0)
nRBC: 0 10*3/uL (ref 0.00–0.01)

## 2023-05-05 LAB — COMPREHENSIVE METABOLIC PANEL
ALT: 16 U/L (ref 12–78)
AST: 8 U/L — ABNORMAL LOW (ref 15–37)
Albumin/Globulin Ratio: 1.1 (ref 1.1–2.2)
Albumin: 3.9 g/dL (ref 3.5–5.0)
Alk Phosphatase: 65 U/L (ref 45–117)
Anion Gap: 8 mmol/L (ref 2–12)
BUN/Creatinine Ratio: 13 (ref 12–20)
BUN: 8 mg/dL (ref 6–20)
CO2: 25 mmol/L (ref 21–32)
Calcium: 9.8 mg/dL (ref 8.5–10.1)
Chloride: 101 mmol/L (ref 97–108)
Creatinine: 0.63 mg/dL (ref 0.55–1.02)
Est, Glom Filt Rate: >90 mL/min/{1.73_m2} (ref >60)
Globulin: 3.6 g/dL (ref 2.0–4.0)
Glucose: 307 mg/dL — ABNORMAL HIGH (ref 65–100)
Potassium: 4.2 mmol/L (ref 3.5–5.1)
Sodium: 134 mmol/L — ABNORMAL LOW (ref 136–145)
Total Bilirubin: 0.6 mg/dL (ref 0.2–1.0)
Total Protein: 7.5 g/dL (ref 6.4–8.2)

## 2023-05-05 LAB — VITAMIN D 25 HYDROXY: Vit D, 25-Hydroxy: 27 ng/mL — ABNORMAL LOW (ref 30–100)

## 2023-05-05 LAB — HEMOGLOBIN A1C
Estimated Avg Glucose: 344 mg/dL
Hemoglobin A1C: 13.6 % — ABNORMAL HIGH (ref 4.0–5.6)

## 2023-05-06 LAB — THYROID CASCADE PROFILE: TSH: 1.3 u[IU]/mL (ref 0.450–4.500)

## 2023-05-12 LAB — GAMMOPATHY EVAL, SPEP/IFE, IG QT/FLC
A/G Ratio: 1 (ref 0.7–1.7)
Albumin: 3.5 g/dL (ref 2.9–4.4)
Alpha-1-Globulin: 0.2 g/dL (ref 0.0–0.4)
Alpha-2-Globulin: 0.7 g/dL (ref 0.4–1.0)
Beta Globulin: 1.4 g/dL — ABNORMAL HIGH (ref 0.7–1.3)
Free Kappa Light Chains: 25.8 mg/L — ABNORMAL HIGH (ref 3.3–19.4)
Free Lambda Light Chains: 18.6 mg/L (ref 5.7–26.3)
Gamma Globulin: 1.3 g/dL (ref 0.4–1.8)
Globulin: 3.6 g/dL (ref 2.2–3.9)
IgA: 455 mg/dL — ABNORMAL HIGH (ref 87–352)
IgG, Serum: 1444 mg/dL (ref 586–1602)
IgM: 85 mg/dL (ref 26–217)
K/L Ratio: 1.39 (ref 0.26–1.65)
Total Protein: 7.1 g/dL (ref 6.0–8.5)

## 2023-05-16 LAB — PROTEIN ELECTROPHORESIS, URINE
Albumin Urine: 100 %
Alpha-1-Globulin, U: 0 %
Alpha-2-Globulin, U: 0 %
Beta Globulin, U: 0 %
Gamma Globulin, U: 0 %
Protein, Total Urine: 17.8 mg/dL

## 2023-05-26 ENCOUNTER — Encounter

## 2023-06-01 ENCOUNTER — Ambulatory Visit: Admit: 2023-06-01 | Payer: MEDICAID | Attending: Family Medicine | Primary: Family Medicine

## 2023-06-01 VITALS — BP 159/96 | HR 105 | Temp 98.90000°F | Resp 18 | Ht 62.0 in | Wt 240.0 lb

## 2023-06-01 DIAGNOSIS — E1140 Type 2 diabetes mellitus with diabetic neuropathy, unspecified: Secondary | ICD-10-CM

## 2023-06-01 MED ORDER — INSULIN LISPRO (1 UNIT DIAL) 100 UNIT/ML SC SOPN
100 | Freq: Three times a day (TID) | SUBCUTANEOUS | 4 refills | Status: AC
Start: 2023-06-01 — End: ?

## 2023-06-01 MED ORDER — DEXCOM G7 SENSOR MISC
3 refills | 20.00000 days | Status: DC
Start: 2023-06-01 — End: 2024-05-10

## 2023-06-01 MED ORDER — GLIPIZIDE ER 5 MG PO TB24
5 | ORAL_TABLET | Freq: Every day | ORAL | 3 refills | Status: DC
Start: 2023-06-01 — End: 2023-06-01

## 2023-06-01 MED ORDER — GLIPIZIDE ER 5 MG PO TB24
5 | ORAL_TABLET | ORAL | 0 refills | Status: DC
Start: 2023-06-01 — End: 2024-02-19

## 2023-06-01 MED ORDER — TERBINAFINE HCL 250 MG PO TABS
250 | ORAL_TABLET | Freq: Every day | ORAL | 0 refills | 90.00000 days | Status: DC
Start: 2023-06-01 — End: 2023-09-25

## 2023-06-01 MED ORDER — ONETOUCH ULTRA 2 W/DEVICE KIT
PACK | Freq: Every day | 0 refills | Status: AC
Start: 2023-06-01 — End: ?

## 2023-06-01 MED ORDER — GLIPIZIDE ER 10 MG PO TB24
10 | ORAL_TABLET | Freq: Every day | ORAL | 3 refills | Status: AC
Start: 2023-06-01 — End: ?

## 2023-06-01 MED ORDER — METFORMIN HCL ER 500 MG PO TB24
500 | ORAL_TABLET | Freq: Two times a day (BID) | ORAL | 3 refills | Status: AC
Start: 2023-06-01 — End: ?

## 2023-06-01 MED ORDER — CHOLECALCIFEROL 25 MCG (1000 UT) PO TABS
25 | ORAL_TABLET | Freq: Every day | ORAL | 3 refills | Status: DC
Start: 2023-06-01 — End: 2024-01-12

## 2023-06-01 MED ORDER — GABAPENTIN 100 MG PO CAPS
100 | ORAL_CAPSULE | Freq: Every evening | ORAL | 1 refills | Status: DC
Start: 2023-06-01 — End: 2024-01-09

## 2023-06-01 NOTE — Progress Notes (Signed)
Assessment & Plan  1. Diabetes mellitus: Suboptimal control. A1c 13.6. On metformin 1000 mg BID, glipizide 5mg  (titrate to 10mg  over a week) QD, and Humalog 10 units TID. Discontinued Toujeo due to side effects.  - Advise Humalog 10 units TID with meals  - Consider Mounjaro if no improvement  - Explore Dexcom for continuous glucose monitoring    2. Hyperlipidemia: LDL 36, triglycerides 160.  - On rosuvastatin 20 mg QHS  - Consider dosage reduction due to improvement    3. Hypertension: Home BP ~127/77 mmHg.  - Continue home monitoring  - Bring BP cuff to next visit for calibration  - Consider medication adjustment if BP remains high    4. Vitamin D deficiency.  - Advise 1000 units of vitamin D daily    5. Strep throat: Day 3 of amoxicillin BID. Feels weak, scratchy throat but no significant pain.  - Advise completing antibiotics course  - Take with food to avoid stomach upset    Follow-up  - Check blood pressures at home and bring BP cuff to next visit  - Reassess diabetes management and consider newer medications    It was a pleasure seeing Alyssa [name] today.  Return in about 4 months (around 09/29/2023) for chronic disease management.    History of Present Illness  The patient is a 43 year old female presenting for a 53-month follow-up for chronic disease management.    Diabetes Mellitus  - Manages with metformin 1000 mg BID, glipizide 2.5 mg QD, and Humalog 10 units QHS  - Discontinued Toujeo due to localized burning sensations  - HbA1c has decreased from 13.9% to 13.6%  - Interested in transitioning to a Dexcom continuous glucose monitoring system as she finds the [employer] to be ineffective    Hyperlipidemia  - On rosuvastatin 20 mg QHS for the past 2 years  - Brief interruption due to an insurance transition    Hypertension  - Continues current antihypertensive regimen  - Home blood pressure readings average approximately 127/77 mmHg  - Reports feeling well overall but is uncertain if she took her  antihypertensive medication today    Recent Infections  - Had a sinus infection on 04/29/2023, treated with amoxicillin  - Currently on day 3 of a course of amoxicillin BID for a recent streptococcal pharyngitis infection  - Reports feeling weak, with a scratchy throat but no pain  - Reports a dry cough and mucus production causing abdominal discomfort    Supplemental Information: None    MEDICATIONS  - Current:    - Amoxicillin    - Rosuvastatin    - Metformin    - Glipizide    - Humalog  - Discontinued:    - Toujeo    The following portions of the patient's history were reviewed and updated as appropriate: allergies, current medications, family history, medical history, social history, surgical history and problem list.    BP (!) 159/96 (Site: Left Lower Arm, Position: Sitting, Cuff Size: Medium Adult)   Pulse (!) 105   Temp 98.9 F (37.2 C) (Oral)   Resp 18   Ht 1.575 m (5\' 2" )   Wt 108.9 kg (240 lb)   SpO2 96%   BMI 43.90 kg/m   Physical Exam  Constitutional: a bit sick-appearing, no acute distress  Eyes: EOM intact. PERRL bilateral. Not icteric.  Cardiac: normal rate, regular rhythm.  Pulm: normal rate, normal effort.  Skin: normal color and turgor.  Lower extrem: no edema.  Results  - Protein electrophoresis:    - IgA elevation  - Laboratory Studies:    - LDL cholesterol: 36    - Triglycerides: 160    - Vitamin D: low    - A1c: 13.6      The patient (or guardian, if applicable) and other individuals in attendance with the patient were advised that Artificial Intelligence will be utilized during this visit to record and process the conversation to generate a clinical note. The patient (or guardian, if applicable) and other individuals in attendance at the appointment consented to the use of AI, including the recording.          An electronic signature was used to authenticate this note.     Portions of this note may have been populated using smart dictation software and may have "sounds-like"  errors present.     Pt was counseled on risks, benefits and alternatives of treatment options. All questions were asked and answered and the patient was agreeable with the treatment plan as outlined.    Langley Gauss, MD  [facility]  [phone]

## 2023-06-01 NOTE — Progress Notes (Signed)
Chief Complaint   Patient presents with    3 Month Follow-Up     Alyssa [name] is a 43 y.o. female who presents for a 76m follow up visit. New concerns?       "Have you been to the ER, urgent care clinic since your last visit?  Hospitalized since your last visit?"    YES - When: approximately 2 days ago.  Where and Why: [facility]. 04/29/23 sinus infection both antiobiotics amoxicillan.    "Have you seen or consulted any other health care providers outside of [facility] since your last visit?"    NO            Click Here for Release of Records Request

## 2023-06-06 ENCOUNTER — Encounter

## 2023-06-06 MED ORDER — METOPROLOL SUCCINATE ER 25 MG PO TB24
25 | ORAL_TABLET | Freq: Every evening | ORAL | 1 refills | Status: DC
Start: 2023-06-06 — End: 2024-02-28

## 2023-06-08 MED ORDER — VALACYCLOVIR HCL 500 MG PO TABS
500 | ORAL_TABLET | Freq: Two times a day (BID) | ORAL | 1 refills | Status: DC
Start: 2023-06-08 — End: 2023-11-28

## 2023-06-12 NOTE — Telephone Encounter (Signed)
 PA for Dexcom G7 has been approved.   06/12/2023-06/10/2024.    Freestyle Libre 2 has been termed.

## 2023-08-04 ENCOUNTER — Encounter

## 2023-08-04 MED ORDER — FLUTICASONE PROPIONATE 50 MCG/ACT NA SUSP
50 | NASAL | 1 refills | 60.00000 days | Status: DC
Start: 2023-08-04 — End: 2023-09-21

## 2023-08-17 ENCOUNTER — Ambulatory Visit: Payer: Medicaid (Managed Care) | Attending: Pediatric Pulmonology | Primary: Family Medicine

## 2023-08-25 ENCOUNTER — Ambulatory Visit: Payer: Medicaid (Managed Care) | Attending: Pediatric Pulmonology | Primary: Family Medicine

## 2023-08-28 ENCOUNTER — Encounter

## 2023-08-30 MED ORDER — ROSUVASTATIN CALCIUM 20 MG PO TABS
20 | ORAL_TABLET | Freq: Every evening | ORAL | 3 refills | 90.00000 days | Status: AC
Start: 2023-08-30 — End: ?

## 2023-09-19 ENCOUNTER — Ambulatory Visit
Admit: 2023-09-19 | Discharge: 2023-09-19 | Payer: Medicaid (Managed Care) | Attending: Pediatric Pulmonology | Primary: Family Medicine

## 2023-09-19 VITALS — HR 88 | Temp 98.10000°F | Ht 62.0 in

## 2023-09-19 DIAGNOSIS — G4733 Obstructive sleep apnea (adult) (pediatric): Secondary | ICD-10-CM

## 2023-09-19 NOTE — Progress Notes (Signed)
 [address] [place], [place] [address]  Tel.  [phone]    Fax. [phone]     [address]   [place], [place] [address]  Tel.  [phone]    Fax. [phone]     [address]   [place], [place] [address]  Tel.  [phone]    Fax. [phone]       Alyssa [name] is a 43 y.o. year old female referred by Dr. Cloteal Daniels  for evaluation of a sleep disorder.       ASSESSMENT/PLAN:     Diagnosis Orders   1. OSA (obstructive sleep apnea)  PAT - Home Sleep Test      2. Essential hypertension        3. BMI 40.0-44.9, adult [facility])            Patient has a history and examination consistent with the diagnosis of sleep apnea.    No follow-ups on file.    * The patient currently has a High Risk for having sleep apnea.  STOP-BANG score 6.    * Sleep testing was ordered for initial evaluation.      Orders Placed This Encounter   Procedures    PAT - Home Sleep Test     Standing Status:   Future     Expected Date:   09/19/2023     Expiration Date:   03/20/2024     Location For Sleep Study:   [place]       * She was provided information on sleep apnea including corresponding risk factors and the importance of proper treatment.     * Treatment options were reviewed in detail. she would like to proceed with PAP therapy. Patient will be seen in follow-up in 6-8 weeks after PAP setup to gauge treatment response and adherence to therapy.     * The patient was counseled regarding proper sleep hygiene, with emphasis on ensuring sufficient total sleep time; safe driving and the benefits of exercise and weight loss.      * All of her questions were addressed.    2. Hypertension -  continue on current regimen, she will continue to monitor her BP and follow up with her primary care provider for reevaluation/adjustment of medications if warranted.  I have reviewed the relationship between hypertension as it relates to sleep-disordered  breathing.    3. Recommended a dedicated weight loss program through appropriate diet and exercise regimen as significant weight reduction has been shown to reduce severity of obstructive sleep apnea.     SUBJECTIVE/OBJECTIVE:    Alyssa [name] is an 43 y.o. female referred for evaluation for a sleep disorder. She complains of snoring associated with snorting, periods of not breathing, awakening in the middle of the night because of urination. Patient reports of poor sleep quality which is not restorative, she feels tired and fatigued during the day. Symptoms began several years ago, gradually worsening since that time. She usually can fall asleep in 30 minutes.  Family or house members note snoring, periods of not breathing.     Epworth Sleepiness Score: 16  Modified F.O.S.Q. Score Total / 2: 13.5      She denies of symptoms indicative of cataplexy, sleep paralysis or sleep related hallucinations.    She denies of a history of unusual movements occurring during sleep.    Alyssa [name] does wake up frequently at night. She is bothered by waking up too early and left unable  to get back to sleep. She actually sleeps about 5 hours at night and wakes up about 3 times during the night. She does work shifts: Technical sales engineer.   Alyssa [name] indicates she does get too little sleep at night. Her bedtime is 2300. She awakens at 69. She does not take naps.  She has the following observed behaviors: Loud snoring, Light snoring, Pauses in breathing;  .  Other remarks: waking with a gasp or snort    Allergies   Allergen Reactions    Bee Pollen Anaphylaxis    Bee Venom Anaphylaxis    Bergera [name] Alyssa [name] Tree) Alyssa [name]] Anaphylaxis    Other/Food Anaphylaxis     Alyssa [name] , and bee product    Hydralazine Nausea Only     Elevated HR    Jardiance [Empagliflozin]      Uti/ug yeast infections    Adhesive Tape Itching and Rash    Codeine Nausea And Vomiting    Iodine Nausea And Vomiting    Povidone-Iodine Hives and Rash    Sulfa  Antibiotics Rash    Trulicity  [Dulaglutide ] Nausea And Vomiting         Current Outpatient Medications:     rosuvastatin  (CRESTOR ) 20 MG tablet, TAKE 1 TABLET BY MOUTH EVERY DAY AT NIGHT, Disp: 90 tablet, Rfl: 3    fluticasone  (FLONASE ) 50 MCG/ACT nasal spray, SPRAY 1 SPRAY BY EACH NOSTRIL ROUTE IN THE MORNING AND AT BEDTIME, Disp: 16 mL, Rfl: 1    valACYclovir  (VALTREX ) 500 MG tablet, TAKE 1 TABLET BY MOUTH TWICE A DAY (Patient taking differently: Take 1 tablet by mouth as needed), Disp: 180 tablet, Rfl: 1    metoprolol  succinate (TOPROL  XL) 25 MG extended release tablet, TAKE 1 TABLET BY MOUTH EVERY DAY AT NIGHT, Disp: 90 tablet, Rfl: 1    Continuous Glucose Sensor (DEXCOM G7 SENSOR) MISC, 1 each by Does not apply route every 10 days, Disp: 9 each, Rfl: 3    insulin  lispro, 1 Unit Dial , (HUMALOG  KWIKPEN) 100 UNIT/ML SOPN, Inject 10 Units into the skin 3 times daily (before meals), Disp: 5 Adjustable Dose Pre-filled Pen Syringe, Rfl: 4    metFORMIN  (GLUCOPHAGE -XR) 500 MG extended release tablet, Take 2 tablets by mouth in the morning and at bedtime, Disp: 360 tablet, Rfl: 3    vitamin D (CHOLECALCIFEROL ) 25 MCG (1000 UT) TABS tablet, Take 1 tablet by mouth daily, Disp: 90 tablet, Rfl: 3    glipiZIDE  (GLUCOTROL  XL) 10 MG extended release tablet, Take 1 tablet by mouth daily, Disp: 90 tablet, Rfl: 3    gabapentin  (NEURONTIN ) 100 MG capsule, Take 1 capsule by mouth nightly for 180 days. Intended supply: 90 days Max Daily Amount: 100 mg, Disp: 90 capsule, Rfl: 1    pantoprazole  (PROTONIX ) 40 MG tablet, Take 1 tablet by mouth every morning (before breakfast), Disp: 30 tablet, Rfl: 0    sucralfate  (CARAFATE ) 1 GM tablet, Take 1 tablet by mouth 4 times daily, Disp: 120 tablet, Rfl: 3    dicyclomine  (BENTYL ) 20 MG tablet, Take 1 tablet by mouth 4 times daily, Disp: 40 tablet, Rfl: 0    ondansetron  (ZOFRAN -ODT) 4 MG disintegrating tablet, Take 1 tablet by mouth 3 times daily as needed for Nausea or Vomiting, Disp: 15  tablet, Rfl: 0    sennosides-docusate sodium  (SENOKOT-S) 8.6-50 MG tablet, Take 1-2 tablets by mouth daily With miralax  treatment. (Patient taking differently: Take 1-2 tablets by mouth as needed for Constipation With miralax  treatment.), Disp: 30 tablet, Rfl: 0  polyethylene glycol (GLYCOLAX ) 17 GM/SCOOP powder, Take 17 g by mouth 2 times daily as needed (constipation), Disp: 510 g, Rfl: 1    lisinopril  (PRINIVIL ;ZESTRIL ) 40 MG tablet, Take 1 tablet by mouth daily, Disp: 90 tablet, Rfl: 3    acetaminophen  (TYLENOL  8 HOUR) 650 MG extended release tablet, Take 1 tablet by mouth as needed for Pain, Disp: , Rfl:     Levocetirizine Dihydrochloride (XYZAL ALLERGY 24HR PO), Take 1 tablet by mouth daily, Disp: , Rfl:     famotidine  (PEPCID ) 20 MG tablet, Take 1 tablet by mouth 2 times daily, Disp: 60 tablet, Rfl: 0    amoxicillin  (AMOXIL ) 500 MG capsule, Take 1 capsule by mouth 3 times daily (Patient not taking: Reported on 09/19/2023), Disp: , Rfl:     Blood Glucose Monitoring Suppl (ONE TOUCH ULTRA 2) w/Device KIT, 1 kit by Does not apply route daily (Patient not taking: Reported on 09/19/2023), Disp: 1 kit, Rfl: 0    glipiZIDE  (GLUCOTROL  XL) 5 MG extended release tablet, Take 1 tablet by mouth daily for 7 days, THEN 2 tablets daily for 21 days., Disp: 49 tablet, Rfl: 0    naproxen (NAPROSYN) 500 MG tablet, Take 1 tablet by mouth 2 times daily (with meals) (Patient not taking: Reported on 09/19/2023), Disp: , Rfl:     ONETOUCH ULTRA strip, TEST 1 TIMES A DAY & AS NEEDED FOR SYMPTOMS OF IRREGULAR BLOOD GLUCOSE. E11.9 (Patient not taking: Reported on 09/19/2023), Disp: , Rfl:      She  has a past medical history of Anemia, Anxiety, Arthritis, Asthma, Bowel obstruction (HCC), Chronic pain, Diabetes (HCC), Dry eye, Endometriosis, Gastroparesis, GERD (gastroesophageal reflux disease), HSV-2 (herpes simplex virus 2) infection, Hyperlipidemia, Hypertension, Migraines, Morbid obesity (HCC), Ovarian cyst, PUD (peptic ulcer disease),  SBO (small bowel obstruction) (HCC), Sebaceous cyst, Sleep apnea, and Thrombocytopenia.    She  has a past surgical history that includes Small intestine surgery (02/26/2021); Salpingo-oophorectomy (Right); Partial hysterectomy (03/23/2016); hernia repair (11/16/2016); Appendectomy (11/2002); Tubal ligation (2011); Cesarean section; Urological Surgery (03/21/2016); Carpal tunnel release (Right); orthopedic surgery; hx partial colectomy (Left, 05/01/2011); Endometrial ablation (11/2015); Colonoscopy; colectomy; Colonoscopy (N/A, 12/21/2021); Upper gastrointestinal endoscopy (N/A, 12/21/2021); Upper gastrointestinal endoscopy (N/A, 12/21/2021); Colonoscopy (N/A, 12/21/2021); Skin lesion excision (Left, 09/16/2022); and subtotal colectomy (10/29/2011).    She family history includes Breast Cancer in her maternal aunt, paternal grandmother, and another family member; Cancer in her maternal grandmother and paternal grandfather; Diabetes in her brother, brother, and maternal aunt; High Blood Pressure in her brother, maternal aunt, maternal grandmother, and mother; High Cholesterol in her brother; Hypertension in her brother, father, and mother; Learning Disabilities in her brother; Mental Illness in her maternal grandmother; Obesity in her brother, maternal aunt, and mother; Prostate Cancer in her paternal grandfather; Psychiatric Disorder in her maternal grandmother; Stroke in her father; Substance Abuse in her brother.    She  reports that she quit smoking about 15 years ago. Her smoking use included cigarettes. She started smoking about 30 years ago. She has a 3.9 pack-year smoking history. She has never used smokeless tobacco. She reports that she does not drink alcohol and does not use drugs.     The patient has not undergone diagnostic testing for the current problems.     Review of Systems:  Constitutional:  No significant weight loss or weight gain  Eyes:  No blurred vision  CVS:  No significant chest pain  Pulm:   No significant shortness of breath  GI:  No  significant nausea or vomiting  GU:  + significant nocturia  Musculoskeletal:  No significant joint pain at night  Skin:  No significant rashes  Neuro:  No significant dizziness   Psych:  No active mood issues    Sleep Review of Systems: notable for Positive difficulty falling asleep; Positive awakenings at night; Positive perceived regular dreaming; Positive nightmares; Positive  early morning headaches; Positive  memory problems; Positive  concentration issues; Positive caffeine ;  Negative alcohol;   Negative history of any automobile or occupational accidents due to daytime drowsiness.      Pulse 88   Temp 98.1 F (36.7 C) (Temporal)   Ht 1.575 m (5\' 2" )   SpO2 97%   BMI 43.90 kg/m         General:   Alert, oriented, not in acute distress   Eyes:  Anicteric Sclerae; intact EOM's   Nose:  No obvious nasal septum deviation    Oropharynx:   Mallampati score 4, thick tongue base, uvula not seen due to low-lying soft palate, narrow tonsilo-pharyngeal pilars, tongue scalloped   Neck:   midline trachea,  no JVD   Chest/Lungs:  symmetrical lung expansion ,clear lung fields on auscultation    CVS:  Normal rate, regular rhythm    Extremities:  No obvious rashes, absent edema    Neuro:  No focal deficits; No obvious tremor    Psych:  Normal eye contact; normal  affect, normal countenance          Ronda Cocks, MD, FAASM  Diplomate American Board of Sleep Medicine  Diplomate in Sleep Medicine - ABP    Electronically signed. 09/19/23

## 2023-09-20 ENCOUNTER — Encounter

## 2023-09-21 MED ORDER — FLUTICASONE PROPIONATE 50 MCG/ACT NA SUSP
50 | NASAL | 1 refills | 60.00 days | Status: AC
Start: 2023-09-21 — End: ?

## 2023-09-23 ENCOUNTER — Encounter

## 2023-09-25 ENCOUNTER — Encounter

## 2023-09-25 MED ORDER — TERBINAFINE HCL 250 MG PO TABS
250 | ORAL_TABLET | Freq: Every day | ORAL | 0 refills | 25.00000 days | Status: DC
Start: 2023-09-25 — End: 2024-03-13

## 2023-09-26 MED ORDER — LISINOPRIL 40 MG PO TABS
40 | ORAL_TABLET | Freq: Every day | ORAL | 1 refills | 90.00000 days | Status: DC
Start: 2023-09-26 — End: 2024-04-04

## 2023-10-06 ENCOUNTER — Ambulatory Visit: Payer: Medicaid (Managed Care) | Primary: Family Medicine

## 2023-10-12 ENCOUNTER — Ambulatory Visit: Payer: Medicaid (Managed Care) | Primary: Family Medicine

## 2023-10-13 ENCOUNTER — Ambulatory Visit: Payer: Medicaid (Managed Care) | Primary: Family Medicine

## 2023-11-28 MED ORDER — VALACYCLOVIR HCL 500 MG PO TABS
500 | ORAL_TABLET | Freq: Two times a day (BID) | ORAL | 1 refills | 30.00000 days | Status: AC
Start: 2023-11-28 — End: ?

## 2024-01-06 ENCOUNTER — Encounter

## 2024-01-08 ENCOUNTER — Encounter

## 2024-01-09 MED ORDER — GABAPENTIN 100 MG PO CAPS
100 | ORAL_CAPSULE | Freq: Every evening | ORAL | 3 refills | Status: AC
Start: 2024-01-09 — End: 2024-07-07

## 2024-01-10 ENCOUNTER — Encounter

## 2024-01-12 MED ORDER — DEXCOM G7 RECEIVER DEVI
1 refills | Status: AC
Start: 2024-01-12 — End: ?

## 2024-01-12 MED ORDER — VITAMIN D (ERGOCALCIFEROL) 1.25 MG (50000 UT) PO CAPS
1.25 | ORAL_CAPSULE | ORAL | 1 refills | 49.00000 days | Status: AC
Start: 2024-01-12 — End: ?

## 2024-01-16 ENCOUNTER — Inpatient Hospital Stay: Admit: 2024-01-16 | Payer: Medicaid (Managed Care) | Primary: Family Medicine

## 2024-01-16 DIAGNOSIS — Z1231 Encounter for screening mammogram for malignant neoplasm of breast: Secondary | ICD-10-CM

## 2024-01-16 NOTE — Telephone Encounter (Signed)
 PA for Dexcom G7 has been submitted.

## 2024-01-21 ENCOUNTER — Inpatient Hospital Stay
Admit: 2024-01-21 | Discharge: 2024-01-21 | Disposition: A | Discharge status: Home / Self Care | Payer: Medicaid (Managed Care) | Arrived: VH | Attending: Emergency Medicine

## 2024-01-21 DIAGNOSIS — B349 Viral infection, unspecified: Principal | ICD-10-CM

## 2024-01-21 LAB — COVID-19 & INFLUENZA COMBO
Rapid Influenza A By PCR: NOT DETECTED
Rapid Influenza B By PCR: NOT DETECTED
SARS-CoV-2, PCR: NOT DETECTED

## 2024-01-21 MED ORDER — ONDANSETRON 4 MG PO TBDP
4 | ORAL | Status: AC
Start: 2024-01-21 — End: 2024-01-21
  Administered 2024-01-21: 10:00:00 8 mg via ORAL

## 2024-01-21 MED ORDER — KETOROLAC TROMETHAMINE 30 MG/ML IJ SOLN
30 | INTRAMUSCULAR | Status: AC
Start: 2024-01-21 — End: 2024-01-21
  Administered 2024-01-21: 10:00:00 60 mg via INTRAMUSCULAR

## 2024-01-21 MED FILL — ONDANSETRON 4 MG PO TBDP: 4 mg | ORAL | Qty: 2

## 2024-01-21 MED FILL — KETOROLAC TROMETHAMINE 30 MG/ML IJ SOLN: 30 mg/mL | INTRAMUSCULAR | Qty: 2
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
## 2024-01-21 NOTE — ED Triage Notes (Signed)
"  Pt reports she was possibly exposed to Covid. Pt states fatigue,headache,vomiting x1.5 hours.  "

## 2024-01-25 ENCOUNTER — Ambulatory Visit: Admit: 2024-01-25 | Discharge: 2024-01-25 | Payer: Medicaid (Managed Care) | Primary: Family Medicine

## 2024-01-25 MED ORDER — FLUCONAZOLE 150 MG PO TABS
150 | ORAL_TABLET | Freq: Once | ORAL | 2 refills | 6.00000 days | Status: DC | PRN
Start: 2024-01-25 — End: 2024-02-19

## 2024-01-25 NOTE — Progress Notes (Addendum)
"  Alyssa [name] is a 43 y.o. female presents for a problem visit.    Chief Complaint   Patient presents with    Vaginal Itching    Urinary Burning     No LMP recorded. Patient has had a hysterectomy.  Birth Control: status post hysterectomy.  Last Pap: normal obtained 1 year(s) ago.    The patient is reporting having: Vaginal Irritation, burning when urinating. Pt states the feeling like heartburn. Pt left urine sample. Pt states swollen vaginal area and hurts to sit. Pt is worried about speculum exam        1. Have you been to the ER, urgent care clinic, or hospitalized since your last visit? Yes, 01/21/24, covid concerns    2. Have you seen or consulted any other health care providers outside of the [facility] since your last visit? No    Examination chaperoned by LAMYA PERRY, CMA.  "
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
## 2024-01-25 NOTE — Assessment & Plan Note (Signed)
"   I reviewed that her current symptoms could be related to yeast but could also be the start of a herpes outbreak  I recommend she increase her Valtrex  to 1 g twice daily for 3 days for treatment of possible current outbreak         "

## 2024-01-30 LAB — NUSWAB VAGINITIS PLUS (VG+)
Candida albicans, NAA: POSITIVE — AB
Candida glabrata: POSITIVE — AB
Chlamydia trachomatis, NAA: NEGATIVE
Trichomonas Vaginalis by NAA: NEGATIVE

## 2024-02-03 LAB — HIV 1/2 AG/AB, 4TH GENERATION,W RFLX CONFIRM: HIV Screen 4th Generation wRfx: NONREACTIVE

## 2024-02-03 LAB — RPR: RPR: NONREACTIVE

## 2024-02-03 LAB — HEPATITIS B SURFACE ANTIBODY

## 2024-02-03 LAB — HEPATITIS C ANTIBODY: Hepatitis C Ab: NONREACTIVE

## 2024-02-07 ENCOUNTER — Inpatient Hospital Stay
Admit: 2024-02-07 | Discharge: 2024-02-07 | Disposition: A | Discharge status: Home / Self Care | Payer: Auto Insurance (includes no fault) | Arrived: VH | Attending: Emergency Medicine

## 2024-02-07 DIAGNOSIS — M542 Cervicalgia: Secondary | ICD-10-CM

## 2024-02-07 MED ORDER — TIZANIDINE HCL 4 MG PO TABS
4 | ORAL_TABLET | Freq: Three times a day (TID) | ORAL | 0 refills | Status: AC | PRN
Start: 2024-02-07 — End: ?

## 2024-02-07 MED ORDER — IBUPROFEN 800 MG PO TABS
800 | ORAL_TABLET | Freq: Three times a day (TID) | ORAL | 0 refills | Status: AC | PRN
Start: 2024-02-07 — End: ?
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
## 2024-02-07 NOTE — Discharge Instructions (Signed)
"  I recommend that you ice your mid and low back.  Rest over the next several days.  Continue to take 1000 mg of Tylenol  3-4 times daily.  Alternate with ibuprofen  as prescribed.  Take Zanaflex  as needed for muscle spasm.  This medication may make you drowsy, do not drive if taking it.  Follow-up with PCP.  Return to the ER if you develop new or worrisome symptoms.  "

## 2024-02-19 ENCOUNTER — Ambulatory Visit
Admit: 2024-02-19 | Discharge: 2024-02-19 | Payer: Auto Insurance (includes no fault) | Attending: Family Medicine | Primary: Family Medicine

## 2024-02-19 VITALS — BP 153/93 | HR 108 | Temp 97.60000°F | Resp 20 | Ht 62.0 in | Wt 235.0 lb

## 2024-02-19 DIAGNOSIS — L301 Dyshidrosis [pompholyx]: Principal | ICD-10-CM

## 2024-02-19 MED ORDER — TRIAMCINOLONE ACETONIDE 0.025 % EX CREA
0.025 | Freq: Two times a day (BID) | CUTANEOUS | 1 refills | 30.00000 days | Status: AC | PRN
Start: 2024-02-19 — End: ?

## 2024-02-19 NOTE — Progress Notes (Signed)
 "Assessment & Plan  1. Dyshidrotic eczema: Symptoms consistent with dyshidrotic eczema, likely exacerbated by frequent hand washing due to occupation. Peeling skin attributed to dryness.  - Prescribe triamcinolone  cream to apply nightly for 1.5 to 2 weeks.  - Recommend use of moisturizing hand sanitizer lotion.  - Advise wearing gloves at night to enhance skin hydration.    2. Migraines: May be triggered by tension headaches, potentially due to dehydration from uncontrolled diabetes.  - Recommend monitoring blood pressure and blood glucose levels closely.    3. Diabetes mellitus: Suboptimal diabetes management due to lack of insurance and medication for 4 months. Resumed medications last week and has not monitored blood glucose levels. On Humalog  10 units 3 times daily, metformin , and glipizide  10 mg daily. Dexcom helpful in the past but incompatible with new phone. Needs a receiver.  - Confirm current diabetes medication regimen: Humalog  10 units three times daily, metformin , and glipizide  10 mg daily.  - Provide sample receiver for Dexcom to facilitate glucose monitoring.  - Recommend obtaining a multivitamin containing B12, B6, and B9 in their natural forms.  - Encourage consumption of vegetable-based juices to support diabetes management.    4. Hypertension: Blood pressure elevated at 153/93 mmHg during this visit.  - Advise resuming antihypertensive medications: metoprolol  25 mg and lisinopril  40 mg.  - Recommend regular blood pressure monitoring.    5. Gastroesophageal reflux disease: Warm sensation in the stomach without esophageal discomfort. No current need for reflux medication.  - Recommend continuing vitamin B12 and vitamin D  supplements.  - Advise taking reflux medication only as needed.    Follow-up: Follow up in 3 months.    It was a pleasure seeing [name] today.  No follow-ups on file.    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    Pruritus and Rashes  - Reports pruritus localized to the palmar surfaces of her hands, persisting for four months.  - Accompanied by minor rashes extending to her fingers.  - Rashes characterized by slight peeling without vesiculation, primarily located at the junction of the palmar and dorsal aspects of her hands.  - Attributes exacerbation of symptoms to frequent handwashing, which increased during the summer months.  - Temporary relief achieved with Aquaphor, though its efficacy is limited due to frequent removal during work activities.    Migraines and Headaches  - Describes a recurrence of migraines, similar to those experienced while residing in [place] .  - Has had two migraines and one severe headache, managed with acetaminophen  PM, a wet washcloth, and resting in a dark, quiet environment.  - Continues to experience mild cephalalgia.  - Recently cut her hair and began wearing dreadlocks, though the initial headache preceded these changes.  - Reports adequate hydration and denies dysuria or other urinary symptoms.  - Suspects the most recent headache may be an adverse effect of metformin , as she has previously experienced severe headaches upon resuming this medication.  - Notably, this is the first instance in which nausea has accompanied her headache.    Glycemic Control  - Reports suboptimal glycemic control secondary to a four-month lapse in insurance coverage and medication access.  - Resumed her antidiabetic medications last week but has not monitored her blood glucose levels.  - Current regimen includes insulin  lispro (Humalog ) 10 units three times daily, metformin , and glipizide  10 mg daily.  - Previously utilized a Dexcom continuous glucose monitor, which  she found beneficial, but it is incompatible with her new phone.  - Possesses sensors but requires a receiver.  - Dulaglutide  (Trulicity ) was discontinued due to a prior episode of bowel obstruction.    Blood Pressure  - Blood  pressure has been well-controlled with regular antihypertensive therapy.  - Did not take her medication this morning and has not recently measured her blood pressure.  - Regimen includes metoprolol  25 mg and lisinopril  40 mg daily.    Gastroesophageal Reflux Disease (GERD)  - Discontinued her proton pump inhibitor therapy.  - Reports a warm sensation in her stomach consistent with GERD.  - Denies similar symptoms in her esophagus or throat.    - Currently taking vitamin B12 and vitamin D  supplements.    Social History: The patient is employed as a research scientist (physical sciences). She consumes coffee regularly.    The following portions of the patient's history were reviewed and updated as appropriate: allergies, current medications, family history, medical history, social history, surgical history and problem list.    BP (!) 153/93 Comment: pt has not taken BP meds  Pulse (!) 108   Temp 97.6 F (36.4 C) (Temporal)   Resp 20   Ht 1.575 m (5' 2)   Wt 106.6 kg (235 lb)   SpO2 94%   BMI 42.98 kg/m   Physical Exam  General Appearance: Normal.  Vital signs: Within normal limits.  HEENT: Within normal limits.  Respiratory: Within normal limits.  Extremities: Dry skin with peeling on the palmar and dorsal aspects of the hands.  Skin: Dry skin with peeling on the palmar and dorsal aspects of the hands.  Neurological: Normal.    Results        The patient (or guardian, if applicable) and other individuals in attendance with the patient were advised that Artificial Intelligence will be utilized during this visit to record and process the conversation to generate a clinical note. The patient (or guardian, if applicable) and other individuals in attendance at the appointment consented to the use of AI, including the recording.          An electronic signature was used to authenticate this note.     Portions of this note may have been populated using smart dictation software and may have sounds-like errors present.     Pt was  counseled on risks, benefits and alternatives of treatment options. All questions were asked and answered and the patient was agreeable with the treatment plan as outlined.    Juliene EMERSON Sanders, MD  [facility]  [phone]                "

## 2024-02-19 NOTE — Patient Instructions (Signed)
"  Find a multivitamin that has methylated B12 (methylcobalamin), methylated folic acid/B9 (L-5-methyl-folate), and a natural form of B6/pyroxidine (Pyridoxal-5-phosphate), or that is derived from natural organic sources (vegetables or yeast-based sources).    Some generally considered reputable supplement brands include Pure Encapsulations, Micro[employer]redients, [employer], [place],  [address], [place], [employer], [employer], [place]. Also could look at [facility] organic vegetable-source vitamins.    If you want to get your vitamins, minerals, and electrolytes naturally, try making your own vegetable juice (to avoid too much sugar). You can adjust the amount of ingredients to get the volume you are looking for. These types of juices are available at [facility] or [employer] and other health-conscious grocery stores if you want to try before committing to juicing yourself.    Green Juice (8 fl oz per day) Ingredients (close to 1 gallon total juice which should keep for 1 week):   32 ounces of Spinach  32 ounces of Kale  2-3 Lemon  2-3 thumbs length of Ginger  6 Cucumbers  2-3 Celery  2-3 Green Apple    Red Juice (8 fl oz per day) Ingredients (close to 1 gallon total which should keep for 1 week):  4 Beets, red  4 Gala Apples  2 Lemons  10-12 Carrots    "

## 2024-02-19 NOTE — Progress Notes (Signed)
"  Chief Complaint   Patient presents with    Skin Problem     Both hands peeling week, pt feels little bumps   Itchy x4 months      Have you been to the ER, urgent care clinic since your last visit?  Hospitalized since your last visit?    NO    Have you seen or consulted any other health care providers outside of [facility] since your last visit?    NO            Click Here for Release of Records Request  "

## 2024-02-20 ENCOUNTER — Encounter

## 2024-02-22 ENCOUNTER — Ambulatory Visit
Admit: 2024-02-22 | Discharge: 2024-02-22 | Payer: Medicaid (Managed Care) | Attending: Family Medicine | Primary: Family Medicine

## 2024-02-22 MED ORDER — LANTUS SOLOSTAR 100 UNIT/ML SC SOPN
100 | Freq: Every evening | SUBCUTANEOUS | 3 refills | Status: AC
Start: 2024-02-22 — End: ?
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
## 2024-02-22 NOTE — Patient Instructions (Signed)
"  Tube sock with fresh tennis ball works well for pressure and massage.    2.   Thera-cane for self-massage off of [employer] or at a local medical supply store. Use it to apply consistent constant pressure to tight musculature to force the muscle to release/relax.        "

## 2024-02-22 NOTE — Progress Notes (Signed)
"  Chief Complaint   Patient presents with    Tachycardia     Pt has noticed elevated hr 100-120 feels may be related to hydralazine.  Has a weird feeling RUQ that goes away when she drinks cold water .    Was told she has sleep apnea and has not been able to get study due to no one being at the office on Fridays when she goes       "

## 2024-02-23 ENCOUNTER — Encounter

## 2024-02-23 NOTE — Telephone Encounter (Addendum)
**  Patient is to be scheduled with the [facility] Ambulatory Pharmacist**    Attempt made to contact patient at the mobile number.    Left a message requesting a call back at [phone] to schedule the appointment    Letter Sent    Kathrine Ada [facility]   Ambulatory Pharmacy Technician Clinical Support Specialist  [phone]  Department, toll free: [phone], option 2

## 2024-02-23 NOTE — Telephone Encounter (Signed)
"  Reason for referral: DM  Referring provider: Dr Tamea  Referring provider office: [facility]  Referred to: Bascom Mouton, PharmD, BCPS, CDCES  Status of Patient: New Patient  Length of Appt: 60 minutes  Type of Appt: In Person   Patient need address: [address], [place], [place] [address]   "

## 2024-02-24 LAB — CBC
Hematocrit: 38.1 % (ref 35.0–47.0)
Hemoglobin: 12.2 g/dL (ref 11.5–16.0)
MCH: 26.3 pg (ref 26.0–34.0)
MCHC: 32 g/dL (ref 30.0–36.5)
MCV: 82.3 FL (ref 80.0–99.0)
MPV: 11.8 FL (ref 8.9–12.9)
Nucleated RBCs: 0 /100{WBCs}
Platelets: 434 K/uL — ABNORMAL HIGH (ref 150–400)
RBC: 4.63 M/uL (ref 3.80–5.20)
RDW: 13.4 % (ref 11.5–14.5)
WBC: 10.5 K/uL (ref 3.6–11.0)
nRBC: 0 K/uL (ref 0.00–0.01)

## 2024-02-24 LAB — COMPREHENSIVE METABOLIC PANEL
ALT: 13 U/L (ref 10–35)
AST: 13 U/L (ref 10–35)
Albumin/Globulin Ratio: 1.1 (ref 1.1–2.2)
Albumin: 3.6 g/dL (ref 3.5–5.2)
Alk Phosphatase: 61 U/L (ref 35–104)
Anion Gap: 10 mmol/L (ref 2–14)
BUN/Creatinine Ratio: 19 (ref 12–20)
BUN: 11 mg/dL (ref 6–20)
CO2: 25 mmol/L (ref 20–29)
Calcium: 9.6 mg/dL (ref 8.6–10.0)
Chloride: 97 mmol/L — ABNORMAL LOW (ref 98–107)
Creatinine: 0.59 mg/dL — ABNORMAL LOW (ref 0.60–1.00)
Est, Glom Filt Rate: >90 ml/min/1.73m2 (ref >59)
Globulin: 3.5 g/dL (ref 2.0–4.0)
Glucose: 291 mg/dL — ABNORMAL HIGH (ref 65–100)
Potassium: 4.4 mmol/L (ref 3.5–5.1)
Sodium: 132 mmol/L — ABNORMAL LOW (ref 136–145)
Total Bilirubin: 0.3 mg/dL (ref 0.0–1.2)
Total Protein: 7.1 g/dL (ref 6.4–8.3)

## 2024-02-24 LAB — HEMOGLOBIN A1C
Estimated Avg Glucose: 364 mg/dL
Hemoglobin A1C: 14.3 % — ABNORMAL HIGH (ref 4.0–5.6)

## 2024-02-26 LAB — THYROID CASCADE PROFILE: TSH: 0.953 u[IU]/mL (ref 0.450–4.500)

## 2024-02-26 NOTE — Telephone Encounter (Signed)
**  Patient is to be scheduled with the [facility] Ambulatory Pharmacist**    Third Attempt made to contact patient at the mobile number.    Left a message requesting a call back at [phone] to schedule the appointment    Letter Sent    [facility]   Ambulatory Pharmacy Technician Clinical Support Specialist  [phone]  Department, toll free: [phone], option 2     For Pharmacy Admin Tracking Only    Program: Medical Group  Gap Closed?: No   Time Spent (min): 5

## 2024-02-27 ENCOUNTER — Encounter

## 2024-02-27 ENCOUNTER — Ambulatory Visit: Admit: 2024-02-27 | Discharge: 2024-02-27 | Payer: Medicaid (Managed Care) | Primary: Family Medicine

## 2024-02-27 VITALS — BP 162/84 | Ht 62.0 in | Wt 235.0 lb

## 2024-02-27 DIAGNOSIS — A6000 Herpesviral infection of urogenital system, unspecified: Principal | ICD-10-CM

## 2024-02-27 MED ORDER — VALACYCLOVIR HCL 1 G PO TABS
1 | ORAL_TABLET | Freq: Two times a day (BID) | ORAL | 0 refills | Status: AC
Start: 2024-02-27 — End: 2024-03-12

## 2024-02-27 MED ORDER — LIDOCAINE 5 % EX OINT
5 | CUTANEOUS | 3 refills | Status: AC
Start: 2024-02-27 — End: ?
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
## 2024-02-27 NOTE — Telephone Encounter (Signed)
"  PT name and dob verified    43 yo last ov 01/25/24    PT calling, stating she called the on call MD, and was seeing if she could be seen.  PT reports she treated for a yeast infection with Diflucan , sx subsided, then came back, she took another Diflucan , sx subsided 2 weeks ago. PT reports she started back with sx, went to get a refill at her pharmacy for the Diflucan  and they told her she could not refill. PT reports she then took Monistat 1 otc and it blew up overnight.   PT is crying and states she is in pain, she cannot sit, she is lying on her side, cannot wear underwear. PT complains of vaginal swelling an burning.    RN placed PT on hold, consulted Dr.Anderson, who reviewed her hx and relayed that she could come in for an ov this am, but she may need to do a bx, it may not be yeast, and since she is in pain see if she wants to scheduled for another day this week. Dr.Anderson also verbalizes she can use Benadryl , could make her sleepy, if she wanted to schedule for another day, and to never use Monistat 1.     RN relayed Dr.Anderson's verbalization and advice and PT wants to come in today at 10 and verbalizes understanding.    PT was placed on the schedule for 10 am for 15 minutes per [facility] for today.  "

## 2024-02-27 NOTE — Telephone Encounter (Signed)
"  Per Dr.Anderson in regards to a work note for being seen in the office:  Lenon Leita RAMAN, DO to Me  [place]      02/27/24 12:35 PM  Yes she can have a note to miss work today and tomorrow if she wants.    Letter was composed per verbalization of Dr.Anderson and sent to PT's [facility] per her request.    PT was called back per RN and relayed that her requested letter was composed and sent to her [facility].  PT verbalizes understanding.  "

## 2024-02-27 NOTE — Telephone Encounter (Signed)
"  PT is calling, was seen in the office, and has left.  PT is needing a letter for the ov today, for work.    Please advise  Any specifics?    Can be sent to her [facility]  Thank you  "

## 2024-02-27 NOTE — Progress Notes (Addendum)
"  Alyssa [name] is a 43 y.o. female presents for a problem visit.    Chief Complaint   Patient presents with    recurring yeast     No LMP recorded. Patient has had a hysterectomy.  Birth Control: status post hysterectomy.  Last Pap: normal obtained 1 year(s) ago.    The patient is reporting having: recurring yeast infection. Pt states she just had A1c checked and its 14.2 and thinks that's the reason why yeast is back       1. Have you been to the ER, urgent care clinic, or hospitalized since your last visit? Yes, 02/07/24, back and neck pain    2. Have you seen or consulted any other health care providers outside of the [facility] since your last visit? No    Examination chaperoned by LAMYA PERRY, CMA.  "

## 2024-02-28 MED ORDER — METOPROLOL SUCCINATE ER 25 MG PO TB24
25 | ORAL_TABLET | Freq: Every evening | ORAL | 3 refills | 90.00000 days | Status: DC
Start: 2024-02-28 — End: 2024-05-10

## 2024-03-06 NOTE — Telephone Encounter (Signed)
"  Pt requesting call back or message in Mychart to advise if safe to take Vitamin D  50,000 unit capsule as directed once a week while on Rosuvastatin .    Pt concerned after reading it wasn't safe to take Vitamin D  with statin drugs.   Ldm  "

## 2024-03-08 NOTE — Telephone Encounter (Signed)
"  Messaged pt in [place].   "

## 2024-03-13 ENCOUNTER — Ambulatory Visit: Admit: 2024-03-13 | Discharge: 2024-03-13 | Payer: Medicaid (Managed Care) | Primary: Family Medicine

## 2024-03-13 VITALS — BP 159/96 | HR 98 | Wt 239.4 lb

## 2024-03-13 DIAGNOSIS — R350 Frequency of micturition: Principal | ICD-10-CM

## 2024-03-13 LAB — AMB POC URINALYSIS DIP STICK AUTO W/O MICRO
Bilirubin, Urine, POC: NEGATIVE
Blood, Urine, POC: NEGATIVE
Glucose, Urine, POC: NEGATIVE
Ketones, Urine, POC: NEGATIVE
Leukocyte Esterase, Urine, POC: NEGATIVE
Nitrite, Urine, POC: NEGATIVE
Protein, Urine, POC: NEGATIVE
Specific Gravity, Urine, POC: 1.2 — AB (ref 1.001–1.035)
Urobilinogen, POC: 0.2
pH, Urine, POC: 6 (ref 4.6–8.0)

## 2024-03-13 MED ORDER — NITROFURANTOIN MONOHYD MACRO 100 MG PO CAPS
100 | ORAL_CAPSULE | Freq: Two times a day (BID) | ORAL | 0 refills | 7.00000 days | Status: AC
Start: 2024-03-13 — End: 2024-03-18

## 2024-03-13 MED ORDER — PHENAZOPYRIDINE HCL 200 MG PO TABS
200 | ORAL_TABLET | Freq: Three times a day (TID) | ORAL | 0 refills | 4.00000 days | Status: AC | PRN
Start: 2024-03-13 — End: 2024-03-15

## 2024-03-13 NOTE — Progress Notes (Signed)
"  Alyssa [name] is a 43 y.o. female presents for a problem visit.    No chief complaint on file.    No LMP recorded. Patient has had a hysterectomy.  Birth Control: status post hysterectomy.  Last Pap: normal obtained 1 year(s) ago.    The patient is reporting having: Urinary frequency        1. Have you been to the ER, urgent care clinic, or hospitalized since your last visit? No    2. Have you seen or consulted any other health care providers outside of the [facility] since your last visit? No    Examination chaperoned by Ileana Bihari, LPN.  "
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
## 2024-03-15 LAB — CULTURE, URINE

## 2024-03-22 ENCOUNTER — Inpatient Hospital Stay: Admit: 2024-03-22 | Payer: Medicaid (Managed Care) | Attending: Family Medicine | Primary: Family Medicine

## 2024-03-22 VITALS — BP 205/130

## 2024-03-22 DIAGNOSIS — R011 Cardiac murmur, unspecified: Secondary | ICD-10-CM

## 2024-03-22 LAB — ECHO (TTE) COMPLETE (PRN CONTRAST/BUBBLE/STRAIN/3D)
AV Area by Peak Velocity: 1.9 cm2
AV Area by VTI: 2.1 cm2
AV Mean Gradient: 3 mmHg
AV Mean Velocity: 0.8 m/s
AV Peak Gradient: 6 mmHg
AV Peak Velocity: 1.2 m/s
AV VTI: 25.3 cm
AV Velocity Ratio: 0.58
Aortic Root: 3 cm
E/E' Lateral: 9.24
E/E' Ratio (Averaged): 9.91
E/E' Septal: 10.58
EF Physician: 55 %
Est. RA Pressure: 3 mmHg
Fractional Shortening 2D: 30 % (ref 28–44)
IVSd: 1.1 cm — AB (ref 0.6–0.9)
LA Diameter: 3.7 cm
LA Volume A-L A4C: 39 mL (ref 22–52)
LA Volume A-L A4C: 68 mL — AB (ref 22–52)
LA Volume A/L: 54 mL
LA Volume MOD A2C: 65 mL — AB (ref 22–52)
LA Volume MOD A4C: 38 mL (ref 22–52)
LA/AO Root Ratio: 1.23
LV E' Lateral Velocity: 7.14 cm/s
LV E' Septal Velocity: 6.24 cm/s
LV Mass 2D: 173.6 g — AB (ref 67–162)
LV RWT Ratio: 0.56
LVIDd: 4.3 cm (ref 3.9–5.3)
LVIDs: 3 cm
LVOT Area: 3.1 cm2
LVOT Diameter: 2 cm
LVOT Mean Gradient: 1 mmHg
LVOT Peak Gradient: 2 mmHg
LVOT Peak Velocity: 0.7 m/s
LVOT SV: 51.8 mL
LVOT VTI: 16.4 cm
LVOT:AV VTI Index: 0.65
LVPWd: 1.2 cm — AB (ref 0.6–0.9)
MV A Velocity: 0.72 m/s
MV Area by PHT: 5 cm2
MV Area by VTI: 2.3 cm2
MV E Velocity: 0.66 m/s
MV E Wave Deceleration Time: 152.1 ms
MV E/A: 0.92
MV Max Velocity: 1 m/s
MV Mean Gradient: 2 mmHg
MV Mean Velocity: 0.6 m/s
MV PHT: 44.1 ms
MV Peak Gradient: 4 mmHg
MV VTI: 22.2 cm
MV:LVOT VTI Index: 1.35
RV Free Wall Peak S': 13.9 cm/s
RVSP: 15 mmHg
TAPSE: 2.7 cm (ref 1.7–?)
TR Max Velocity: 1.74 m/s
TR Peak Gradient: 12 mmHg

## 2024-03-29 ENCOUNTER — Inpatient Hospital Stay: Payer: Medicaid (Managed Care) | Attending: Family Medicine | Primary: Family Medicine

## 2024-04-02 NOTE — Telephone Encounter (Signed)
"  Two patient identifiers used      43 year old patient last seen in the office on 05/05/2022 with Dr Walden.     Does not appear she had ae for 2025. Patient last seen in the office on 03/13/2024 for problem visit.    Patient calling to report her AIC is elevated and she is currently having symptoms of vaginal itching and yellow discharge for the past few days.    Patient reports she has been using [facility] and [facility] brand vagisil and that is not helping.      Patient asking for oral treatment ( pill ) for a yeast infection.Alyssa [name]      Pharmacy confirmed.    Patient informed to use monistat over the counter 3,5 or 7 day course to treat her symptoms.        Patient informed to speak to her PCP about her recent lab work as well      Please advise    ?ov    Thank you  "

## 2024-04-03 NOTE — Telephone Encounter (Signed)
"  Lenon Leita RAMAN, DO to Me  [place]      04/03/24  9:09 AM  If her symptoms do not resolve with the Monistat, we can see her and consider the oral pill.  Please recommend she schedule an annual exam as well because I did not realize she did not have this.    04/02/24  4:03 PM  You routed this conversation to Lenon Mayers      Patient informed of MD recommendations and was offered to schedule appointment for ae and patient declined stating she will call back and schedule when she can check her calendar.    Patient has not yet started monistat , is waiting to get paid. Patient verbalized understanding.  "

## 2024-04-04 ENCOUNTER — Encounter

## 2024-04-04 MED ORDER — LISINOPRIL 40 MG PO TABS
40 | ORAL_TABLET | Freq: Every day | ORAL | 3 refills | 90.00000 days | Status: AC
Start: 2024-04-04 — End: ?

## 2024-05-03 ENCOUNTER — Encounter: Payer: Medicaid (Managed Care) | Attending: Family Medicine | Primary: Family Medicine

## 2024-05-10 ENCOUNTER — Ambulatory Visit
Admit: 2024-05-10 | Discharge: 2024-05-10 | Payer: Medicaid (Managed Care) | Attending: Family Medicine | Primary: Family Medicine

## 2024-05-10 ENCOUNTER — Encounter

## 2024-05-10 LAB — HEMOGLOBIN A1C
Estimated Avg Glucose: 335 mg/dL
Hemoglobin A1C: 13.3 % — ABNORMAL HIGH (ref 4.0–5.6)

## 2024-05-10 LAB — CBC
Hematocrit: 36.5 % (ref 35.0–47.0)
Hemoglobin: 11.4 g/dL — ABNORMAL LOW (ref 11.5–16.0)
MCH: 25.9 pg — ABNORMAL LOW (ref 26.0–34.0)
MCHC: 31.2 g/dL (ref 30.0–36.5)
MCV: 83 FL (ref 80.0–99.0)
MPV: 11.6 FL (ref 8.9–12.9)
Nucleated RBCs: 0 /100{WBCs}
Platelets: 431 10*3/uL — ABNORMAL HIGH (ref 150–400)
RBC: 4.4 M/uL (ref 3.80–5.20)
RDW: 13.3 % (ref 11.5–14.5)
WBC: 7.6 10*3/uL (ref 3.6–11.0)
nRBC: 0 10*3/uL (ref 0.00–0.01)

## 2024-05-10 LAB — COMPREHENSIVE METABOLIC PANEL
ALT: 12 U/L (ref 10–35)
AST: 12 U/L (ref 10–35)
Albumin/Globulin Ratio: 1 — ABNORMAL LOW (ref 1.1–2.2)
Albumin: 3.3 g/dL — ABNORMAL LOW (ref 3.5–5.2)
Alk Phosphatase: 55 U/L (ref 35–104)
Anion Gap: 11 mmol/L (ref 2–14)
BUN/Creatinine Ratio: 14 (ref 12–20)
BUN: 7 mg/dL (ref 6–20)
CO2: 25 mmol/L (ref 20–29)
Calcium: 9.4 mg/dL (ref 8.6–10.0)
Chloride: 99 mmol/L (ref 98–107)
Creatinine: 0.5 mg/dL — ABNORMAL LOW (ref 0.60–1.00)
Est, Glom Filt Rate: >90 mL/min/{1.73_m2} (ref >59)
Globulin: 3.3 g/dL (ref 2.0–4.0)
Glucose: 286 mg/dL — ABNORMAL HIGH (ref 65–100)
Potassium: 4.2 mmol/L (ref 3.5–5.1)
Sodium: 135 mmol/L — ABNORMAL LOW (ref 136–145)
Total Bilirubin: 0.5 mg/dL (ref 0.0–1.2)
Total Protein: 6.6 g/dL (ref 6.4–8.3)

## 2024-05-10 LAB — ALBUMIN/CREATININE RATIO, URINE
Albumin Urine: 1.97 mg/dL
Albumin/Creatinine Ratio: 26 mg/g (ref 0–30)
Creatinine, Ur: 76 mg/dL (ref 28.00–217.00)

## 2024-05-10 LAB — VITAMIN D 25 HYDROXY: Vit D, 25-Hydroxy: 31.8 ng/mL (ref 30.0–100.0)

## 2024-05-10 LAB — VITAMIN B12 & FOLATE
Folate: 17.8 ng/mL (ref 4.8–24.2)
Vitamin B-12: 330 pg/mL (ref 232–1245)

## 2024-05-10 MED ORDER — LEVOCETIRIZINE DIHYDROCHLORIDE 5 MG PO TABS
5 | ORAL_TABLET | Freq: Every evening | ORAL | 3 refills | 90.00000 days | Status: AC
Start: 2024-05-10 — End: ?

## 2024-05-10 MED ORDER — RIMEGEPANT SULFATE 75 MG PO TBDP
75 | ORAL_TABLET | Freq: Every day | ORAL | 5 refills | 30.00000 days | Status: AC | PRN
Start: 2024-05-10 — End: ?

## 2024-05-10 MED ORDER — METOPROLOL SUCCINATE ER 50 MG PO TB24
50 | ORAL_TABLET | Freq: Every evening | ORAL | 3 refills | 90.00000 days | Status: AC
Start: 2024-05-10 — End: ?

## 2024-05-10 MED ORDER — BUDESONIDE-FORMOTEROL FUMARATE 160-4.5 MCG/ACT IN AERO
160-4.5 | Freq: Two times a day (BID) | RESPIRATORY_TRACT | 3 refills | Status: AC | PRN
Start: 2024-05-10 — End: ?

## 2024-05-10 MED ORDER — DEXCOM G7 15 DAY SENSOR MISC
3 refills | Status: AC
Start: 2024-05-10 — End: ?
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
## 2024-05-10 NOTE — Progress Notes (Signed)
 Chief Complaint   Patient presents with    Annual Exam     Pt had labs drawn this morning   DX: Strep through on 04/17/2025- still feels some phlem   Pt also has a headache      Have you been to the ER, urgent care clinic since your last visit?  Hospitalized since your last visit?    NO    Have you seen or consulted any other health care providers outside of [facility] since your last visit?    NO            Click Here for Release of Records Request

## 2024-05-10 NOTE — Patient Instructions (Addendum)
 Find a multivitamin that has methylated B12 (methylcobalamin), methylated folic acid/B9 (L-5-methyl-folate), and a natural form of B6/pyroxidine (Pyridoxal-5-phosphate), or that is derived from natural organic sources (vegetables or yeast-based sources).    Some generally considered reputable supplement brands include Pure Encapsulations, Microingredients, Life Extension, Mary Ruth's liquid multivitamin or SmartyPants (found at [employer]), [employer], [employer] [place], Live Good, Garden of Life, Simpson. Also could look at [facility] organic vegetable-source vitamins. There is a women's health/prenatal brand, Pink Stork, that is also good.    =====================================================  Well Visit, Ages 64 to 3: Care Instructions  Well visits can help you stay healthy. Your doctor has checked your overall health and may have suggested ways to take good care of yourself. Your doctor also may have recommended tests. You can help prevent illness with healthy eating, good sleep, vaccinations, regular exercise, and other steps.    Get the tests that you and your doctor decide on. Depending on your age and risks, examples might include screening for diabetes; hepatitis C; HIV; and cervical, breast, lung, and colon cancer. Screening helps find diseases before any symptoms appear.   Eat healthy foods. Choose fruits, vegetables, whole grains, lean protein, and low-fat dairy foods. Limit saturated fat and reduce salt.     Limit alcohol. Men should have no more than 2 drinks a day. Women should have no more than 1. For some people, no alcohol is the best choice.   Exercise. Get at least 30 minutes of exercise on most days of the week. Walking can be a good choice.     Reach and stay at your healthy weight. This will lower your risk for many health problems.   Take care of your mental health. Try to stay connected with friends, family, and community, and find ways to manage stress.     If you're feeling depressed or  hopeless, talk to someone. A counselor can help. If you don't have a counselor, talk to your doctor.   Talk to your doctor if you think you may have a problem with alcohol or drug use. This includes prescription medicines, marijuana, and other drugs.     Avoid tobacco and nicotine: Don't smoke, vape, or chew. If you need help quitting, talk to your doctor.   Practice safer sex. Getting tested, using condoms or dental dams, and limiting sex partners can help prevent STIs.     Use birth control if it's important to you to prevent pregnancy. Talk with your doctor about your choices and what might be best for you.   Prevent problems where you can. Protect your skin from too much sun, wash your hands, brush your teeth twice a day, and wear a seat belt in the car.   Where can you learn more?  Go to [employer] and enter P072 to learn more about Well Visit, Ages 81 to 87: Care Instructions.  Current as of: October 17, 2023  Content Version: 14.6   2024-2025 [place], [place].   Care instructions adapted under license by [facility]. If you have questions about a medical condition or this instruction, always ask your healthcare professional. Romayne Alderman, [facility], disclaims any warranty or liability for your use of this information.

## 2024-05-11 LAB — LIPOPROTEIN NMR
Cholesterol, Total: 115 mg/dL (ref 100–199)
HDL Particle No, NMR: 34.8 umol/L (ref >=30.5)
HDL: 46 mg/dL (ref >39)
LDL Cholesterol: 48 mg/dL (ref 0–99)
LDL Particle Number, NMR: 454 nmol/L (ref <1000)
LDL Particle Size, NMR: 20 nm — ABNORMAL LOW (ref >20.5)
Lipoprotein Insulin Resistance: 47 — ABNORMAL HIGH (ref <=45)
Small LDL Particle, NMR: 306 nmol/L (ref <=527)
Triglycerides: 113 mg/dL (ref 0–149)

## 2024-05-12 LAB — THYROID CASCADE PROFILE: TSH: 1.58 u[IU]/mL (ref 0.450–4.500)

## 2024-05-13 NOTE — Telephone Encounter (Signed)
Nurtec has been denied.

## 2024-05-15 NOTE — Addendum Note (Signed)
 Addended byBETHA SANDERS, Ward Boissonneault on: 05/15/2024 11:50 AM     Modules accepted: Level of Service

## 2024-05-24 ENCOUNTER — Encounter: Payer: Medicaid (Managed Care) | Primary: Family Medicine
